# Patient Record
Sex: Female | Born: 1950
Health system: Southern US, Community
[De-identification: ages and names within clinical notes are randomized; demographics above are authoritative.]

## PROBLEM LIST (undated history)

## (undated) DIAGNOSIS — M1990 Unspecified osteoarthritis, unspecified site: Secondary | ICD-10-CM

## (undated) DIAGNOSIS — R7303 Prediabetes: Secondary | ICD-10-CM

## (undated) DIAGNOSIS — E7800 Pure hypercholesterolemia, unspecified: Secondary | ICD-10-CM

## (undated) DIAGNOSIS — Z9989 Dependence on other enabling machines and devices: Secondary | ICD-10-CM

## (undated) DIAGNOSIS — R609 Edema, unspecified: Secondary | ICD-10-CM

## (undated) DIAGNOSIS — Z973 Presence of spectacles and contact lenses: Secondary | ICD-10-CM

## (undated) DIAGNOSIS — I2699 Other pulmonary embolism without acute cor pulmonale: Secondary | ICD-10-CM

## (undated) DIAGNOSIS — R200 Anesthesia of skin: Secondary | ICD-10-CM

## (undated) DIAGNOSIS — I10 Essential (primary) hypertension: Secondary | ICD-10-CM

## (undated) DIAGNOSIS — N809 Endometriosis, unspecified: Secondary | ICD-10-CM

## (undated) DIAGNOSIS — Z972 Presence of dental prosthetic device (complete) (partial): Secondary | ICD-10-CM

## (undated) DIAGNOSIS — I2609 Other pulmonary embolism with acute cor pulmonale: Secondary | ICD-10-CM

## (undated) HISTORY — PX: OTHER SURGICAL HISTORY: SHX169

## (undated) HISTORY — PX: ABDOMINAL HYSTERECTOMY: SHX81

## (undated) HISTORY — PX: JOINT REPLACEMENT: SHX530

## (undated) HISTORY — PX: COLONOSCOPY: SHX174

## (undated) HISTORY — PX: EYE SURGERY: SHX253

## (undated) NOTE — *Deleted (*Deleted)
Transition of Care [facility]) - Progression Note    Patient Details  Name: Lauren Santana MRN: [id number] Date of Birth: Oct 21, 1951  Transition of Care [facility]) CM/SW Contact  Flynn Gwyn, Olegario Messier, RN Phone Number: 07/25/2020, 10:01 AM  Clinical Narrative:[facility] able to accept with otpt [facility] infusion @ [facility]-Dr. Delano Metz more cycles, q 3weeks-9/9 next treatment.Awaiting d/c summary,rm#,tel# for nsg report.[facility] updated on facility-already has auth#[id number],PTAR auth-Will call [facility].   [facility] unable to provide transoprtation for otpt chemo infusion.TC [facility] rep Yoona Ishii-will confirm if able to manage transportation, if so they can accept for d/c today. Son/patient/MD agree to plan.covid 8/31 neg.      Expected Discharge Plan: Skilled Nursing Facility Barriers to Discharge: Other (comment) (Awaiting [facility] if able to manage otpt chemo infusion transportation.)  Expected Discharge Plan and Services Expected Discharge Plan: Skilled Nursing Facility In-house Referral: Clinical Social Work   Post Acute Care Choice: Skilled Nursing Facility Living arrangements for the past 2 months: Apartment                                       Social Determinants of Health (SDOH) Interventions    Readmission Risk Interventions Readmission Risk Prevention Plan 07/23/2020  Transportation Screening Complete  Medication Review (RN CM) Complete  Some recent data might be hidden

---

## 1998-03-29 ENCOUNTER — Ambulatory Visit (HOSPITAL_BASED_OUTPATIENT_CLINIC_OR_DEPARTMENT_OTHER): Admission: RE | Admit: 1998-03-29 | Discharge: 1998-03-29 | Payer: Self-pay | Admitting: *Deleted

## 1998-05-11 ENCOUNTER — Ambulatory Visit (HOSPITAL_COMMUNITY): Admission: RE | Admit: 1998-05-11 | Discharge: 1998-05-12 | Payer: Self-pay | Admitting: Otolaryngology

## 1999-11-18 ENCOUNTER — Emergency Department (HOSPITAL_COMMUNITY): Admission: EM | Admit: 1999-11-18 | Discharge: 1999-11-18 | Payer: Self-pay | Admitting: Emergency Medicine

## 1999-11-18 ENCOUNTER — Encounter: Payer: Self-pay | Admitting: Emergency Medicine

## 1999-11-25 HISTORY — PX: HERNIA REPAIR: SHX51

## 1999-11-26 ENCOUNTER — Ambulatory Visit (HOSPITAL_COMMUNITY): Admission: RE | Admit: 1999-11-26 | Discharge: 1999-11-26 | Payer: Self-pay | Admitting: Internal Medicine

## 1999-11-26 ENCOUNTER — Encounter: Payer: Self-pay | Admitting: Internal Medicine

## 1999-12-31 ENCOUNTER — Ambulatory Visit (HOSPITAL_COMMUNITY): Admission: RE | Admit: 1999-12-31 | Discharge: 1999-12-31 | Payer: Self-pay | Admitting: Internal Medicine

## 1999-12-31 ENCOUNTER — Encounter: Payer: Self-pay | Admitting: Internal Medicine

## 2000-01-23 ENCOUNTER — Observation Stay (HOSPITAL_COMMUNITY): Admission: EM | Admit: 2000-01-23 | Discharge: 2000-01-24 | Payer: Self-pay | Admitting: Pulmonary Disease

## 2000-01-23 ENCOUNTER — Encounter (INDEPENDENT_AMBULATORY_CARE_PROVIDER_SITE_OTHER): Payer: Self-pay | Admitting: Specialist

## 2000-01-23 ENCOUNTER — Encounter: Payer: Self-pay | Admitting: Pulmonary Disease

## 2000-01-24 ENCOUNTER — Encounter: Payer: Self-pay | Admitting: Pulmonary Disease

## 2000-01-28 ENCOUNTER — Encounter: Payer: Self-pay | Admitting: Pulmonary Disease

## 2000-01-28 ENCOUNTER — Inpatient Hospital Stay (HOSPITAL_COMMUNITY): Admission: EM | Admit: 2000-01-28 | Discharge: 2000-02-01 | Payer: Self-pay | Admitting: Pulmonary Disease

## 2000-01-29 ENCOUNTER — Encounter: Payer: Self-pay | Admitting: Pulmonary Disease

## 2000-01-30 ENCOUNTER — Encounter: Payer: Self-pay | Admitting: Pulmonary Disease

## 2000-01-31 ENCOUNTER — Encounter: Payer: Self-pay | Admitting: Pulmonary Disease

## 2000-02-01 ENCOUNTER — Encounter: Payer: Self-pay | Admitting: Pulmonary Disease

## 2000-02-28 ENCOUNTER — Other Ambulatory Visit: Admission: RE | Admit: 2000-02-28 | Discharge: 2000-02-28 | Payer: Self-pay | Admitting: Obstetrics and Gynecology

## 2000-05-07 ENCOUNTER — Encounter: Payer: Self-pay | Admitting: Emergency Medicine

## 2000-05-07 ENCOUNTER — Emergency Department (HOSPITAL_COMMUNITY): Admission: EM | Admit: 2000-05-07 | Discharge: 2000-05-07 | Payer: Self-pay | Admitting: Emergency Medicine

## 2000-05-14 ENCOUNTER — Other Ambulatory Visit: Admission: RE | Admit: 2000-05-14 | Discharge: 2000-05-14 | Payer: Self-pay | Admitting: Gastroenterology

## 2000-05-14 ENCOUNTER — Encounter (INDEPENDENT_AMBULATORY_CARE_PROVIDER_SITE_OTHER): Payer: Self-pay | Admitting: Specialist

## 2000-07-17 ENCOUNTER — Encounter: Payer: Self-pay | Admitting: Obstetrics and Gynecology

## 2000-07-20 ENCOUNTER — Inpatient Hospital Stay (HOSPITAL_COMMUNITY): Admission: RE | Admit: 2000-07-20 | Discharge: 2000-07-25 | Payer: Self-pay | Admitting: Obstetrics and Gynecology

## 2000-07-20 ENCOUNTER — Encounter (INDEPENDENT_AMBULATORY_CARE_PROVIDER_SITE_OTHER): Payer: Self-pay

## 2001-02-28 ENCOUNTER — Encounter: Payer: Self-pay | Admitting: Emergency Medicine

## 2001-02-28 ENCOUNTER — Emergency Department (HOSPITAL_COMMUNITY): Admission: EM | Admit: 2001-02-28 | Discharge: 2001-02-28 | Payer: Self-pay | Admitting: Emergency Medicine

## 2001-02-28 ENCOUNTER — Emergency Department (HOSPITAL_COMMUNITY): Admission: EM | Admit: 2001-02-28 | Discharge: 2001-03-01 | Payer: Self-pay | Admitting: Emergency Medicine

## 2001-03-01 ENCOUNTER — Encounter: Payer: Self-pay | Admitting: Emergency Medicine

## 2001-03-03 ENCOUNTER — Encounter: Payer: Self-pay | Admitting: Internal Medicine

## 2001-03-03 ENCOUNTER — Inpatient Hospital Stay (HOSPITAL_COMMUNITY): Admission: EM | Admit: 2001-03-03 | Discharge: 2001-03-05 | Payer: Self-pay | Admitting: Internal Medicine

## 2001-03-04 ENCOUNTER — Encounter: Payer: Self-pay | Admitting: Internal Medicine

## 2001-04-06 ENCOUNTER — Encounter: Payer: Self-pay | Admitting: Internal Medicine

## 2001-04-06 ENCOUNTER — Ambulatory Visit (HOSPITAL_COMMUNITY): Admission: RE | Admit: 2001-04-06 | Discharge: 2001-04-06 | Payer: Self-pay | Admitting: Internal Medicine

## 2001-12-13 ENCOUNTER — Other Ambulatory Visit: Admission: RE | Admit: 2001-12-13 | Discharge: 2001-12-13 | Payer: Self-pay | Admitting: Obstetrics and Gynecology

## 2002-01-13 ENCOUNTER — Encounter: Payer: Self-pay | Admitting: Otolaryngology

## 2002-01-13 ENCOUNTER — Encounter: Admission: RE | Admit: 2002-01-13 | Discharge: 2002-01-13 | Payer: Self-pay | Admitting: Otolaryngology

## 2003-01-19 ENCOUNTER — Other Ambulatory Visit: Admission: RE | Admit: 2003-01-19 | Discharge: 2003-01-19 | Payer: Self-pay | Admitting: Obstetrics and Gynecology

## 2004-02-28 ENCOUNTER — Other Ambulatory Visit: Admission: RE | Admit: 2004-02-28 | Discharge: 2004-02-28 | Payer: Self-pay | Admitting: Obstetrics and Gynecology

## 2004-10-21 ENCOUNTER — Ambulatory Visit: Payer: Self-pay | Admitting: Internal Medicine

## 2004-12-05 ENCOUNTER — Ambulatory Visit: Payer: Self-pay | Admitting: Internal Medicine

## 2004-12-13 ENCOUNTER — Ambulatory Visit: Payer: Self-pay | Admitting: Internal Medicine

## 2005-01-24 ENCOUNTER — Ambulatory Visit: Payer: Self-pay | Admitting: Internal Medicine

## 2005-02-19 ENCOUNTER — Ambulatory Visit: Payer: Self-pay | Admitting: Internal Medicine

## 2005-07-01 ENCOUNTER — Ambulatory Visit: Payer: Self-pay | Admitting: Internal Medicine

## 2005-07-18 ENCOUNTER — Ambulatory Visit: Payer: Self-pay | Admitting: Internal Medicine

## 2005-08-16 ENCOUNTER — Emergency Department (HOSPITAL_COMMUNITY): Admission: EM | Admit: 2005-08-16 | Discharge: 2005-08-16 | Payer: Self-pay | Admitting: Emergency Medicine

## 2005-12-31 ENCOUNTER — Ambulatory Visit: Payer: Self-pay | Admitting: Internal Medicine

## 2006-02-26 ENCOUNTER — Ambulatory Visit: Payer: Self-pay | Admitting: Internal Medicine

## 2006-03-31 ENCOUNTER — Ambulatory Visit: Payer: Self-pay | Admitting: Internal Medicine

## 2006-04-14 ENCOUNTER — Ambulatory Visit: Payer: Self-pay | Admitting: Internal Medicine

## 2006-06-24 ENCOUNTER — Ambulatory Visit: Payer: Self-pay | Admitting: Internal Medicine

## 2006-07-20 ENCOUNTER — Ambulatory Visit: Payer: Self-pay | Admitting: Nurse Practitioner

## 2006-07-21 ENCOUNTER — Encounter: Payer: Self-pay | Admitting: Vascular Surgery

## 2006-07-21 ENCOUNTER — Ambulatory Visit (HOSPITAL_COMMUNITY): Admission: RE | Admit: 2006-07-21 | Discharge: 2006-07-21 | Payer: Self-pay | Admitting: Family Medicine

## 2006-07-24 ENCOUNTER — Ambulatory Visit: Payer: Self-pay | Admitting: *Deleted

## 2006-09-07 ENCOUNTER — Ambulatory Visit: Payer: Self-pay | Admitting: Nurse Practitioner

## 2006-10-08 ENCOUNTER — Ambulatory Visit: Payer: Self-pay | Admitting: Nurse Practitioner

## 2006-12-22 ENCOUNTER — Ambulatory Visit: Payer: Self-pay | Admitting: Nurse Practitioner

## 2007-01-22 DIAGNOSIS — N803 Endometriosis of pelvic peritoneum: Secondary | ICD-10-CM | POA: Insufficient documentation

## 2007-01-22 DIAGNOSIS — I10 Essential (primary) hypertension: Secondary | ICD-10-CM | POA: Insufficient documentation

## 2007-03-22 ENCOUNTER — Ambulatory Visit: Payer: Self-pay | Admitting: Nurse Practitioner

## 2007-03-25 ENCOUNTER — Encounter: Admission: RE | Admit: 2007-03-25 | Discharge: 2007-03-25 | Payer: Self-pay | Admitting: Family Medicine

## 2007-05-25 ENCOUNTER — Ambulatory Visit: Payer: Self-pay | Admitting: Family Medicine

## 2007-06-08 ENCOUNTER — Ambulatory Visit: Payer: Self-pay | Admitting: Family Medicine

## 2007-08-11 ENCOUNTER — Encounter (INDEPENDENT_AMBULATORY_CARE_PROVIDER_SITE_OTHER): Payer: Self-pay | Admitting: *Deleted

## 2007-08-16 ENCOUNTER — Ambulatory Visit: Payer: Self-pay | Admitting: Internal Medicine

## 2007-10-12 ENCOUNTER — Ambulatory Visit: Payer: Self-pay | Admitting: Family Medicine

## 2008-01-25 ENCOUNTER — Ambulatory Visit: Payer: Self-pay | Admitting: Nurse Practitioner

## 2008-01-27 ENCOUNTER — Ambulatory Visit: Payer: Self-pay | Admitting: Family Medicine

## 2008-02-14 ENCOUNTER — Ambulatory Visit: Payer: Self-pay | Admitting: Internal Medicine

## 2008-02-16 ENCOUNTER — Ambulatory Visit: Payer: Self-pay | Admitting: Internal Medicine

## 2008-03-15 ENCOUNTER — Ambulatory Visit: Payer: Self-pay | Admitting: Internal Medicine

## 2008-03-23 ENCOUNTER — Encounter: Admission: RE | Admit: 2008-03-23 | Discharge: 2008-06-21 | Payer: Self-pay | Admitting: Nurse Practitioner

## 2008-04-18 ENCOUNTER — Encounter: Admission: RE | Admit: 2008-04-18 | Discharge: 2008-05-12 | Payer: Self-pay | Admitting: Internal Medicine

## 2008-06-21 ENCOUNTER — Ambulatory Visit: Payer: Self-pay | Admitting: Internal Medicine

## 2008-06-21 ENCOUNTER — Encounter (INDEPENDENT_AMBULATORY_CARE_PROVIDER_SITE_OTHER): Payer: Self-pay | Admitting: Family Medicine

## 2008-07-04 ENCOUNTER — Encounter (INDEPENDENT_AMBULATORY_CARE_PROVIDER_SITE_OTHER): Payer: Self-pay | Admitting: Family Medicine

## 2008-07-04 ENCOUNTER — Ambulatory Visit: Payer: Self-pay | Admitting: Internal Medicine

## 2008-07-04 LAB — CONVERTED CEMR LAB
ALT: 19 units/L (ref 0–35)
AST: 18 units/L (ref 0–37)
Albumin: 4.2 g/dL (ref 3.5–5.2)
Alkaline Phosphatase: 61 units/L (ref 39–117)
BUN: 14 mg/dL (ref 6–23)
Basophils Absolute: 0 10*3/uL (ref 0.0–0.1)
Basophils Relative: 1 % (ref 0–1)
CO2: 23 meq/L (ref 19–32)
Calcium: 9.6 mg/dL (ref 8.4–10.5)
Chloride: 105 meq/L (ref 96–112)
Cholesterol: 168 mg/dL (ref 0–200)
Creatinine, Ser: 0.82 mg/dL (ref 0.40–1.20)
Eosinophils Absolute: 0.2 10*3/uL (ref 0.0–0.7)
Eosinophils Relative: 5 % (ref 0–5)
Glucose, Bld: 82 mg/dL (ref 70–99)
HCT: 44.2 % (ref 36.0–46.0)
HDL: 74 mg/dL (ref >39)
Hemoglobin: 13.8 g/dL (ref 12.0–15.0)
LDL Cholesterol: 81 mg/dL (ref 0–99)
Lymphocytes Relative: 49 % — ABNORMAL HIGH (ref 12–46)
Lymphs Abs: 2.3 10*3/uL (ref 0.7–4.0)
MCHC: 31.2 g/dL (ref 30.0–36.0)
MCV: 101.6 fL — ABNORMAL HIGH (ref 78.0–100.0)
Monocytes Absolute: 0.4 10*3/uL (ref 0.1–1.0)
Monocytes Relative: 9 % (ref 3–12)
Neutro Abs: 1.8 10*3/uL (ref 1.7–7.7)
Neutrophils Relative %: 37 % — ABNORMAL LOW (ref 43–77)
Platelets: 131 10*3/uL — ABNORMAL LOW (ref 150–400)
Potassium: 3.8 meq/L (ref 3.5–5.3)
RBC: 4.35 M/uL (ref 3.87–5.11)
RDW: 13.8 % (ref 11.5–15.5)
Sodium: 143 meq/L (ref 135–145)
TSH: 1.547 microintl units/mL (ref 0.350–4.50)
Total Bilirubin: 0.6 mg/dL (ref 0.3–1.2)
Total CHOL/HDL Ratio: 2.3
Total Protein: 7.3 g/dL (ref 6.0–8.3)
Triglycerides: 65 mg/dL (ref <150)
VLDL: 13 mg/dL (ref 0–40)
WBC: 4.8 10*3/uL (ref 4.0–10.5)

## 2008-07-19 ENCOUNTER — Encounter (INDEPENDENT_AMBULATORY_CARE_PROVIDER_SITE_OTHER): Payer: Self-pay | Admitting: Family Medicine

## 2008-07-19 ENCOUNTER — Ambulatory Visit: Payer: Self-pay | Admitting: Internal Medicine

## 2008-07-19 LAB — CONVERTED CEMR LAB
Folate: >20 ng/mL
Vit D, 1,25-Dihydroxy: 12 — ABNORMAL LOW (ref 30–89)

## 2008-08-08 ENCOUNTER — Encounter: Admission: RE | Admit: 2008-08-08 | Discharge: 2008-09-07 | Payer: Self-pay | Admitting: Family Medicine

## 2008-08-14 ENCOUNTER — Ambulatory Visit: Payer: Self-pay | Admitting: Internal Medicine

## 2008-10-10 ENCOUNTER — Encounter (INDEPENDENT_AMBULATORY_CARE_PROVIDER_SITE_OTHER): Payer: Self-pay | Admitting: Family Medicine

## 2008-10-11 ENCOUNTER — Ambulatory Visit: Payer: Self-pay | Admitting: Family Medicine

## 2008-10-26 ENCOUNTER — Ambulatory Visit: Payer: Self-pay | Admitting: Internal Medicine

## 2008-10-26 ENCOUNTER — Encounter (INDEPENDENT_AMBULATORY_CARE_PROVIDER_SITE_OTHER): Payer: Self-pay | Admitting: Internal Medicine

## 2008-10-26 LAB — CONVERTED CEMR LAB
Cholesterol: 175 mg/dL (ref 0–200)
HDL: 66 mg/dL (ref >39)
LDL Cholesterol: 97 mg/dL (ref 0–99)
Total CHOL/HDL Ratio: 2.7
Triglycerides: 61 mg/dL (ref <150)
VLDL: 12 mg/dL (ref 0–40)
Vit D, 1,25-Dihydroxy: 37 (ref 30–89)

## 2008-11-14 ENCOUNTER — Ambulatory Visit (HOSPITAL_COMMUNITY): Admission: RE | Admit: 2008-11-14 | Discharge: 2008-11-14 | Payer: Self-pay | Admitting: Family Medicine

## 2008-11-30 ENCOUNTER — Encounter: Admission: RE | Admit: 2008-11-30 | Discharge: 2008-11-30 | Payer: Self-pay | Admitting: Family Medicine

## 2009-01-26 ENCOUNTER — Ambulatory Visit: Payer: Self-pay | Admitting: Internal Medicine

## 2009-02-15 ENCOUNTER — Ambulatory Visit: Payer: Self-pay | Admitting: Internal Medicine

## 2009-02-19 ENCOUNTER — Ambulatory Visit: Payer: Self-pay | Admitting: Internal Medicine

## 2009-03-02 ENCOUNTER — Ambulatory Visit: Payer: Self-pay | Admitting: Internal Medicine

## 2009-03-02 ENCOUNTER — Encounter (INDEPENDENT_AMBULATORY_CARE_PROVIDER_SITE_OTHER): Payer: Self-pay | Admitting: Internal Medicine

## 2009-03-02 LAB — CONVERTED CEMR LAB
ALT: 26 units/L (ref 0–35)
AST: 25 units/L (ref 0–37)
Albumin: 4.3 g/dL (ref 3.5–5.2)
Alkaline Phosphatase: 67 units/L (ref 39–117)
BUN: 9 mg/dL (ref 6–23)
CO2: 28 meq/L (ref 19–32)
Calcium: 10 mg/dL (ref 8.4–10.5)
Chloride: 104 meq/L (ref 96–112)
Creatinine, Ser: 0.73 mg/dL (ref 0.40–1.20)
Glucose, Bld: 73 mg/dL (ref 70–99)
Potassium: 3.7 meq/L (ref 3.5–5.3)
Sodium: 144 meq/L (ref 135–145)
Total Bilirubin: 0.5 mg/dL (ref 0.3–1.2)
Total Protein: 7.2 g/dL (ref 6.0–8.3)

## 2009-03-06 ENCOUNTER — Ambulatory Visit (HOSPITAL_COMMUNITY): Admission: RE | Admit: 2009-03-06 | Discharge: 2009-03-06 | Payer: Self-pay | Admitting: Internal Medicine

## 2009-04-03 ENCOUNTER — Ambulatory Visit: Payer: Self-pay | Admitting: Family Medicine

## 2009-04-16 ENCOUNTER — Encounter: Admission: RE | Admit: 2009-04-16 | Discharge: 2009-05-16 | Payer: Self-pay | Admitting: Family Medicine

## 2009-04-19 ENCOUNTER — Ambulatory Visit: Payer: Self-pay | Admitting: Family Medicine

## 2009-06-04 ENCOUNTER — Encounter: Admission: RE | Admit: 2009-06-04 | Discharge: 2009-06-04 | Payer: Self-pay | Admitting: Internal Medicine

## 2009-06-22 ENCOUNTER — Ambulatory Visit: Payer: Self-pay | Admitting: Family Medicine

## 2009-07-17 ENCOUNTER — Telehealth (INDEPENDENT_AMBULATORY_CARE_PROVIDER_SITE_OTHER): Payer: Self-pay | Admitting: *Deleted

## 2009-07-23 ENCOUNTER — Telehealth (INDEPENDENT_AMBULATORY_CARE_PROVIDER_SITE_OTHER): Payer: Self-pay | Admitting: *Deleted

## 2009-08-01 ENCOUNTER — Ambulatory Visit: Payer: Self-pay | Admitting: Internal Medicine

## 2009-08-13 ENCOUNTER — Ambulatory Visit: Payer: Self-pay | Admitting: Internal Medicine

## 2009-08-15 ENCOUNTER — Ambulatory Visit: Payer: Self-pay | Admitting: Internal Medicine

## 2009-11-24 HISTORY — PX: MULTIPLE TOOTH EXTRACTIONS: SHX2053

## 2009-12-20 ENCOUNTER — Ambulatory Visit: Payer: Self-pay | Admitting: Internal Medicine

## 2009-12-25 ENCOUNTER — Ambulatory Visit (HOSPITAL_COMMUNITY): Admission: RE | Admit: 2009-12-25 | Discharge: 2009-12-25 | Payer: Self-pay | Admitting: Pulmonary Disease

## 2010-01-17 ENCOUNTER — Ambulatory Visit: Payer: Self-pay | Admitting: Internal Medicine

## 2010-01-21 ENCOUNTER — Ambulatory Visit (HOSPITAL_COMMUNITY): Admission: RE | Admit: 2010-01-21 | Discharge: 2010-01-21 | Payer: Self-pay | Admitting: Internal Medicine

## 2010-02-21 ENCOUNTER — Ambulatory Visit: Payer: Self-pay | Admitting: Internal Medicine

## 2010-02-21 LAB — CONVERTED CEMR LAB
BUN: 8 mg/dL (ref 6–23)
CO2: 24 meq/L (ref 19–32)
Calcium: 9.2 mg/dL (ref 8.4–10.5)
Chloride: 101 meq/L (ref 96–112)
Creatinine, Ser: 0.85 mg/dL (ref 0.40–1.20)
Glucose, Bld: 80 mg/dL (ref 70–99)
Potassium: 5 meq/L (ref 3.5–5.3)
Sodium: 137 meq/L (ref 135–145)
Uric Acid, Serum: 6.8 mg/dL (ref 2.4–7.0)

## 2010-02-28 ENCOUNTER — Ambulatory Visit: Payer: Self-pay | Admitting: Internal Medicine

## 2010-03-07 ENCOUNTER — Ambulatory Visit: Payer: Self-pay | Admitting: Internal Medicine

## 2010-03-07 LAB — CONVERTED CEMR LAB
BUN: 10 mg/dL (ref 6–23)
CO2: 27 meq/L (ref 19–32)
Calcium: 8.9 mg/dL (ref 8.4–10.5)
Chloride: 103 meq/L (ref 96–112)
Creatinine, Ser: 0.84 mg/dL (ref 0.40–1.20)
Glucose, Bld: 86 mg/dL (ref 70–99)
Magnesium: 2.3 mg/dL (ref 1.5–2.5)
Potassium: 3.9 meq/L (ref 3.5–5.3)
Sodium: 141 meq/L (ref 135–145)

## 2010-03-14 ENCOUNTER — Ambulatory Visit: Payer: Self-pay | Admitting: Internal Medicine

## 2010-03-14 LAB — CONVERTED CEMR LAB
BUN: 14 mg/dL (ref 6–23)
CO2: 25 meq/L (ref 19–32)
Calcium: 9.3 mg/dL (ref 8.4–10.5)
Chloride: 105 meq/L (ref 96–112)
Creatinine, Ser: 0.85 mg/dL (ref 0.40–1.20)
Glucose, Bld: 80 mg/dL (ref 70–99)
Magnesium: 2.1 mg/dL (ref 1.5–2.5)
Potassium: 3.8 meq/L (ref 3.5–5.3)
Sodium: 142 meq/L (ref 135–145)

## 2010-03-19 ENCOUNTER — Telehealth: Payer: Self-pay | Admitting: Gastroenterology

## 2010-03-28 ENCOUNTER — Ambulatory Visit: Payer: Self-pay | Admitting: Internal Medicine

## 2010-04-09 ENCOUNTER — Encounter: Admission: RE | Admit: 2010-04-09 | Discharge: 2010-05-30 | Payer: Self-pay | Admitting: Internal Medicine

## 2010-05-03 ENCOUNTER — Ambulatory Visit: Payer: Self-pay | Admitting: Internal Medicine

## 2010-05-03 LAB — CONVERTED CEMR LAB
BUN: 11 mg/dL (ref 6–23)
CO2: 28 meq/L (ref 19–32)
Calcium: 9.4 mg/dL (ref 8.4–10.5)
Chloride: 104 meq/L (ref 96–112)
Creatinine, Ser: 0.75 mg/dL (ref 0.40–1.20)
Glucose, Bld: 76 mg/dL (ref 70–99)
Potassium: 3.8 meq/L (ref 3.5–5.3)
Sodium: 142 meq/L (ref 135–145)

## 2010-06-04 ENCOUNTER — Ambulatory Visit: Payer: Self-pay | Admitting: Family Medicine

## 2010-06-05 ENCOUNTER — Encounter (INDEPENDENT_AMBULATORY_CARE_PROVIDER_SITE_OTHER): Payer: Self-pay | Admitting: Family Medicine

## 2010-06-05 ENCOUNTER — Ambulatory Visit: Payer: Self-pay | Admitting: Family Medicine

## 2010-06-13 ENCOUNTER — Encounter: Admission: RE | Admit: 2010-06-13 | Discharge: 2010-06-13 | Payer: Self-pay | Admitting: Family Medicine

## 2010-07-17 ENCOUNTER — Ambulatory Visit: Payer: Self-pay | Admitting: Internal Medicine

## 2010-07-17 LAB — CONVERTED CEMR LAB
BUN: 13 mg/dL (ref 6–23)
CO2: 29 meq/L (ref 19–32)
Calcium: 9.2 mg/dL (ref 8.4–10.5)
Chloride: 103 meq/L (ref 96–112)
Creatinine, Ser: 0.82 mg/dL (ref 0.40–1.20)
Glucose, Bld: 80 mg/dL (ref 70–99)
Magnesium: 2.1 mg/dL (ref 1.5–2.5)
Potassium: 3.6 meq/L (ref 3.5–5.3)
Sodium: 141 meq/L (ref 135–145)

## 2010-07-19 ENCOUNTER — Ambulatory Visit: Payer: Self-pay | Admitting: Internal Medicine

## 2010-09-10 ENCOUNTER — Ambulatory Visit: Payer: Self-pay | Admitting: Internal Medicine

## 2010-12-26 NOTE — Progress Notes (Signed)
Summary: triage/circulation problems  Phone Note Call from Patient   Caller: Patient Reason for Call: Talk to Nurse Summary of Call: Patient came to [employer]..wanting to be seen..stating she has circulation problems...patient had previously cancelled appt 8/6 and resch for 9/8...now states she doesn't know if she can wait that long...patient has hx of lower leg edema  and pain...patient states she has not been following physician instructions of using heat and leg exercises...observed ankles and feet....usual edema noted.  Not red or hot to the touch...adviced patient to follow providers instructions of home care and to keep followup appt.Marland KitchenMarland KitchenCall if conditions worsen...difficulty breathing, severe pain. Initial call taken by: Conchita Paris,  July 17, 2009 10:52 AM

## 2010-12-26 NOTE — Progress Notes (Signed)
Summary: pain medicine change  Phone Note Other Incoming   Summary of Call: Patient came to [facility] today reporting Vicodin is not helping any more than Tramadol and wants to switch back. She does not like to take medicine and would prefer a non narccotic med. She reports occ. numbness in her right leg and some numbness and discomfort in her left hjip and leg. Patient has an appt. for f/u on 08/01/09. She wants to make sure she is checked for diabetes when she comes. Patien advised RN would check with Madaline Savage ID-Din NP to ensure this is okay. Pt. can be reached at [phone] or [phone]. Initial call taken by: Sharen Heck RN,  July 23, 2009 12:30 PM

## 2010-12-26 NOTE — Progress Notes (Signed)
Summary: Schedule recall colon   Phone Note Outgoing Call Call back at Home Phone [phone]   Call placed by: Christie Nottingham CMA Duncan Dull),  March 19, 2010 2:38 PM Call placed to: Patient Summary of Call: Called pt to schedule recall colon but phone number disconnected in EMR and in IDX. Initial call taken by: Christie Nottingham CMA Duncan Dull),  March 19, 2010 2:39 PM

## 2010-12-26 NOTE — Miscellaneous (Signed)
Summary: VIP  Patient: Lauren Santana Note: All result statuses are Final unless otherwise noted.  Tests: (1) VIP (Medications)   LLIMPORTMEDS              "Result Below..."       RESULT: LIPITOR TABS 10 MG*TAKE ONE (1) TABLET BY MOUTH EVERY DAY  GENE HAS ICP LIPITOR 10MG  0808*08/16/2007*Last Refill: [id number]*******   LLIMPORTMEDS              "Result Below..."       RESULT: LABETALOL HCL TABS 100 MG*TAKE 1 AND 1/2 TABLETS TWICE DAILY*08/16/2007*Last Refill: Unknown*[id number]*******   LLIMPORTMEDS              "Result Below..."       RESULT: HYDROCHLOROTHIAZIDE TABS 25 MG*TAKE ONE (1) TABLET EACH DAY*08/16/2007*Last Refill: Unknown*[id number]*******   LLIMPORTMEDS              "Result Below..."       RESULT: DICLOFENAC SODIUM TBEC 75 MG*TAKE ONE (1) TABLET TWICE DAILY*08/16/2007*Last Refill: NWGNFAO*[id number]*******   LLIMPORTALLS              "Result Below..."       RESULT: DOXYCYCLINE HYCLATE ORAL (VIBRAMYCIN)*7063**   LLIMPORTALLS              "Result Below..."       RESULT: OFLOXACIN ORAL (VHQION)*62952**   LLIMPORTALLS              "Result Below..."       RESULT: SULFONAMIDES*44244**   LLIMPORTALLS              "Result Below..."       RESULT: TERBINAFINE ORAL (LAMISIL)*43935**  Note: An exclamation mark (!) indicates a result that was not dispersed into the flowsheet. Document Creation Date: 09/23/2007 3:00 PM _______________________________________________________________________  (1) Order result status: Final Collection or observation date-time: 08/11/2007 Requested date-time: 08/11/2007 Receipt date-time:  Reported date-time: 08/11/2007 Referring Physician:   Ordering Physician:   Specimen Source:  Source: [place] Order Number:  Lab site:

## 2011-03-06 ENCOUNTER — Other Ambulatory Visit: Payer: Self-pay | Admitting: Internal Medicine

## 2011-04-11 NOTE — Discharge Summary (Signed)
[facility]  Patient:    Lauren Santana, Lauren Santana             MRN: [id number] Adm. Date:  [id number] Disc. Date: [id number] Attending:  Sharon Mt CC:         Anselm Pancoast. Zachery Dakins, M.D.   Discharge Summary  HOSPITAL COURSE:  The patient is a 60 year old female who was admitted to the hospital with an umbilical hernia, pelvic pain, history of endometriosis, and intermittent vaginal bleeding for definitive surgery.  On the day of admission she was taken to the operating room and exploratory laparotomy was performed. Findings were an umbilical hernia with multiple fascial defects, dense pelvic adhesions involving both ovaries and tubes without any active endometriosis seen.  She underwent lysis of adhesions, bilateral salpingo-oophorectomy, and repair of an umbilical hernia with a mesh graft.  Postoperatively, she progressed slowly, but surely.  Her drain was removed, her NG tube was removed, she eventually started to pass gas and have a bowel movement, and on September 4 she was ready for discharge.  DISCHARGE MEDICATIONS:  Tylox for pain relief.  DISCHARGE INSTRUCTIONS:  Soft diet to advance as tolerated.  FOLLOWUP:  She was to return to Dr. Vella Redhead office for staple removal and to Dr. Delorise Royals office for final followup.  LABORATORY DATA:  Laboratory data is in chart at the time of dictation.  Final pathology report revealed hernia sac with nonspecific reactive changes, no tumor identified.  Bilateral tubes and ovaries with fibrous adhesions, no tumor identified.  Omentum with fibrous adhesions, no tumor identified. Organizing fat necrosis associated with fibrous adhesions without endometriosis or tumor identified.  CONDITION ON DISCHARGE:  Improved.  DISCHARGE DIAGNOSES: 1. Umbilical hernia with multiple fascial defects. 2. Pelvic pain. 3. Pelvic adhesive disease. 4. Intermittent vaginal bleeding.  OPERATIONS: 1. Exploratory  laparotomy with lysis of adhesions. 2. Bilateral salpingo-oophorectomy. 3. Repair of umbilical hernia with mesh graft. DD:  08/06/00 TD:  08/09/00 Job: [id number]

## 2011-04-11 NOTE — Op Note (Signed)
[facility]  Patient:    Lauren Santana, Lauren Santana             MRN: [id number] Proc. Date: 07/20/00 Adm. Date:  [id number] Attending:  Sharon Mt CC:         Rande Brunt. Eda Paschal, M.D.   Operative Report  PREOPERATIVE DIAGNOSES: 1. Incarcerated incisional hernia. 2. History of endometriosis.  OPERATION:  Repair of incisional herniorrhaphies with mesh reinforcement. Daniel L. Gottsegen, M.D., did a bilateral oophorectomy.  ANESTHESIA:  General.  HISTORY:  Lauren Santana is a 60 year old overweight white female who was referred to me about six weeks ago for an incisional hernia.  The patient has had a long, complex problem with endometriosis, hysterectomy, repair of an incisional hernia by Dr. Michaelle Copas years ago, and she continues to have intermittent bleeding from the vagina that is thought to be related to endometriosis implants and is being followed by Dr. Eda Paschal for this.  I saw her, and on physical exam she had an obvious incisional hernia that would come out to the right below the umbilicus, and there were also defects at the navel with other areas, and I recommended that we proceed with repair of this.  I talked with Dr. Eda Paschal, and he said that since she was having such problems with her endometriosis if she was off of the Depo-Provera, that he recommended we do a combined oophorectomy and let me repair the incisional hernia simultaneously.  He treated her with Depo-Provera with improvement in her symptoms, and she is scheduled electively for surgery today.  Dr. Eda Paschal actually admitted Ms. Potier, and I was planned on asissting him and then repair the incisional hernias.  DESCRIPTION OF PROCEDURE:  The patient preoperatively had Cefotetan.  She had taken a laxative at home, and the abdomen was prepped with Betadine surgical solution, draped in a sterile manner.  A Foley catheter had been inserted sterilely.  The  patient was draped in a sterile manner, and then the lower midline old incision was opened up down through the skin and subcutaneous tissue.  We did not actually go as low as they had done when they did her hysterectomy, since her hernias appeared to really be above that and at the navel, etc.  We then very carefully dissected down, identifying the largest hernia sac, and there was a knuckle of small bowel that was actually caught up in that, along also a large wad of omentum, and we worked through that, kind of extending the fascial defect.  Noted also, there was a small, about 25-cent size incarcerated hernia down in the lower abdomen, plus an area at the navel, and then plus another area about 3 or 4 cm above the navel.  Most of these fascial defects were an inch and a half in diameter.  We continued taking down all the adhesions and chronic scarring.  There was one area that we thought was probably an endometrioma, a fibrotic scar area, and this was sent for frozen, but the pathologist says that he can definitely identify a foreign body reaction, old suture material, but nothing that they could definitely confirm as endometriosis.  This is actually on the small bowel, where it was incarcerated in the hernia, and since they definitely could not confirm endometriosis, I elected not to remove that since the area was partially obstructed but after you reduced the hernia, etc., everything would go easily through the intestine.  With the small bowel freed up, etc., then Dr. Eda Paschal did  the bilateral salpingo-oophorectomy, the right first and then the left, and he will dictate this portion of the procedure.  After he had completed his portion, there appeared to be reasonably good hemostasis.  He dropped out, and I completed the herniorrhaphies.  Before I actually put in the mesh in the preperitoneal space, but since she has had a recurrent, this is the second time this hernia had been  attempted, I elected to actually place the mesh on top of the fascia and used a piece of Prolene mesh about 10 x 4 inches in size.  I reinspected the pelvis after we had dissected out the fascial edges circumferentially, etc., and repaired the actual fascial defect, the higher one above the umbilicus, without actually cutting the little bridge of fascia between the umbilicus and this hernia.  We then dissected and started closing the fascia with figure-of-eights of 0 Vicryl, and then the mesh, which goes about 2 inches past the incisions in all directions, we sutured that in with 0 Prolene sutures that were just placed in the anterior abdominal wall.  I was using the malleable up under the area and inspection, etc.  The actual sutures to hold the mesh in place were placed first, and then the incision was closed.  I inspected everything, and it appears that one little area up in the upper right, we had caught the omentum with a stitch, and we pulled that stitch out and placed another under direct vision.  Next, we closed the midline fascia and then incorporated the mesh in the sutures and then placed the sutures through the mesh that had been previously placed laterally and tied everything down so it was flat.  I then used a 10 mm Blake drain on top of the mesh and then closed the subcutaneous tissue with either 2-0 or 3-0 Vicryl, and then skin staples.  We will use PCA morphine for postoperative pain control, and I think it would be best to keep her n.p.o. at least for 24 hours because of the extensive dissection and the kind of partial blockage that we identified.  Probably about 300 cc of blood loss with the oophorectomy portion, but there were a lot of little bleeders that required suturing with 3-0 Vicryl or cauterized and in the subcutaneous tissue also. The patient tolerated the procedure nicely, NG tube and Foley catheter in position, and she was sent to the recovery room after a  sterile occlusive dressing had been applied. DD:  07/20/00 TD:  07/21/00 Job: [id number]

## 2011-04-11 NOTE — H&P (Signed)
[facility]  Patient:    Lauren Santana, Lauren Santana                     MRN: [id number] Attending:  Rande Brunt. Eda Paschal, M.D.                         History and Physical  CHIEF COMPLAINT:  Pelvic pain, intermittent vaginal bleeding, endometriosis suspected.  HISTORY OF PRESENT ILLNESS:  Patient is a 60 year old gravida 3, para 3-0-1-3, who has been seeing me now for 3-1/2 years with pelvic pain, intermittent abdominal bleeding.  Patient had undergone a total abdominal hysterectomy back in 1988.  At that time she had multiple leiomyoma, had normal ovaries and tubes, but had a skin lesion which came back endometriosis.  Since then she has had intermittent pelvic pain associated with intermittent vaginal bleeding.  Ultrasound has always failed to reveal any neoplastic masses, although she has had intermittent cysts of the left ovary consistent with endometriosis.  Patient has responded well to Depo-Provera, but has had side effects from the Depo-Provera and continues to get the recurrent pelvic pain and recurrent bleeding.  After 3-1/2 years of putting up with this she has decided to go ahead and have surgery.  At the moment she also has umbilical hernia and this has influenced her decision slightly as she would like to have that repaired.  She is going to have that repaired by Dr. Zachery Dakins at the same time that she has an exploratory laparotomy for the pelvic pain and bleeding.  Our plan is to go ahead and do a bilateral salpingo-oophorectomy. Patient understands that after this she will require hormonal replacement therapy if she is symptomatic, but for relief of the above, she would like to undergo this.  PAST MEDICAL HISTORY:  History of hypertension.  MEDICATIONS:  She takes Verapamil SA 180 mg daily.  She also is on Macrodantin for recurrent urinary tract infections.  ALLERGIES:  SULFA.  FAMILY HISTORY:  Mother has coronary artery disease.  SOCIAL  HISTORY:  Patient is a nonsmoker, nondrinker.  REVIEW OF SYSTEMS:  HEENT is negative.  CARDIAC:  Reveals a history of hypertension.  RESPIRATORY:  Negative.  GASTROINTESTINAL:  Reveals chronic irritable bowel syndrome.  She is being followed for this by her internist and has had a GI evaluation which failed to reveal any reason for the pelvic pain. GENITOURINARY:  Reveals chronic urinary tract infections.  NEUROMUSCULAR: Negative.  ENDOCRINE:  Negative.  PHYSICAL EXAMINATION:  GENERAL:  Patient is a well-developed, well-nourished female in no acute distress.  VITAL SIGNS:  Her blood pressure is 150/94.  Pulse is 80 and regular. Respirations 16 and nonlabored.  She is afebrile.  HEENT:  Within normal limits.  NECK:  Supple.  Trachea in the midline.  Thyroid is not enlarged.  LUNGS:  Clear to P&A.  HEART:  Reveals no thrills, heaves, or murmurs.  BREASTS:  No masses.  ABDOMEN:  Soft without guarding, rebound, or masses.  She has a moderate sized umbilical hernia.  PELVIC:  Sternal and vaginal is within normal limits.  Uterus is surgically absent.  Pap smear shows no atypia.  Adnexae do not appear to be enlarged, but they are bilaterally tender.  RECTAL:  Negative.  EXTREMITIES:  Within normal limits,  IMPRESSION: 1. Chronic pelvic pain. 2. Intermittent vaginal bleeding. 3. Umbilical hernia. 4. Hypertension.  PLAN:  Exploratory laparotomy with bilateral salpingo-oophorectomy and excision of any endometriosis found, repair of  umbilical hernia. DD:  07/19/00 TD:  07/19/00 Job: [id number]

## 2011-04-11 NOTE — H&P (Signed)
[facility]  Patient:    Lauren Santana, Lauren Santana             MRN: [id number] Adm. Date:  [id number] Attending:  Dolores Patty                         History and Physical  DATE OF BIRTH:  Jun 28, 1951.  HISTORY OF PRESENT ILLNESS:  Sherae Santino is a 60 year old Afro-American female admitted with intractable nausea and vomiting.  She was seen initially March 01, 2001, in the office with nausea and vomiting present for three days. She had been to the emergency room on April 6 and February 28, 2001.  At that time, the lipase had been mildly elevated at 75 (normal is less than 51), and the potassium had been 3.4.  The other laboratory studies were negative.  The BUN and creatinine were 7 and 0.8, respectively.  CT of the abdomen and pelvis showed the appendix to be upper of normal caliber; there was a tiny right real cyst.  There was suggestion of cellulitis versus panniculitis of the anterior abdomen and abdominal wall with a fascial defect.  She was not given oral contrast, and because of her body habitus it was difficult to assess pelvic loops, whose margins were poorly seen.  An inflammatory process could not be ruled out.  She was treated with hydrocodone, cephalexin, and promethazine 25 mg orally, and Compazine suppositories.  She had had some loose stools on April 6 and had taken Pepto Bismol with resolution.  Subsequent to that she had constipation.  She had been on Citrucel chronically from her surgeon.  She relates the onset of symptoms to overeating at a church social function on February 26, 2001.  When seen on April 8, abdomen was protuberant, and bowel sounds were decreased.  The hydrocodone was discontinued, and she was placed on Levsin SL every four to six hours as needed orally or under her tongue.  She was to stay on clear liquids for 48-72 hours and use the suppositories.  She was given a shot of Demerol 50 mg and Phenergan  50 mg intramuscular in the office. Gatorade and bananas were recommended for potassium repletion.  Her family called April 10, stating that she was continuing to have nausea and vomiting despite having used all five Compazine suppositories.  He had vomited some five to six times the night prior to the office visit.  She was found to be mildly febrile and clinically was found to have suggestion of an ileus, prompting admission due to these findings and the intractable nausea and vomiting despite optimal outpatient care.  PAST MEDICAL HISTORY:  Herniorrhaphy and total abdominal hysterectomy and bilateral salpingo-oophorectomy in August 2001 by Dr. Eda Paschal and a surgical assistant for endometriosis.  In 2001, she had a cervical lymph node removed by Dr. Suzanna Obey.  The lymph node suggested malignancy clinically but proved to be lymphadenitis and scarring.  She is a gravida 3, para 2, with one set of twins and one lost pregnancy.  FAMILY HISTORY:  Negative for GI disease, diabetes, or stroke.  Her mother had a myocardial infarction.  An uncle had cancer of the lung.  SOCIAL HISTORY:  She does not drink or smoke.  She has no pets; water source is city.  There has been no significant travel.  She has not been on antibiotics other than the cephalexin recently.  MEDICATIONS:  Her present medications include labetalol 200  mg one-half b.i.d., and estradiol.  ALLERGIES:  SULFA.  REVIEW OF SYSTEMS:  Also positive for flatulence and bloating.  She denied fever, chills, or sweats.  She had had some minimal weight loss with the present illness.  She also described profound weakness and fatigue.  On follow-up April 10, she also additionally described some heartburn in addition to the nausea and vomiting with continuing constipation.  She also has some numbness in her hands, which she attributed to circulation.  PHYSICAL EXAMINATION:  VITAL SIGNS:  Temperature was 99.3, weight 191,  respiratory rate 24, pulse 88 and regular, and blood pressure 140/94.  When seen on April 8, her weight was 205, indicating that she had lost 14 pounds in that two-day period.  GENERAL:  She appears profoundly weak, is sitting on the table slumped forward with her head lying on her chest.  HEENT:  Fundus exam revealed some copper wiring.  There was no scleral icterus or jaundice.  Tongue was coated but not dry.  The remainder of the otolaryngologic exam was unremarkable.  NECK:  There was a healed operative scar over the posterior neck.  The thyroid was small without nodularity.  CHEST:  She had minimal rales but no increased work of breathing.  CARDIAC:  An S4 gallop was noted with a pulse rate up to 105 at rest.  ABDOMEN:  Protuberant with dullness in the upper quadrants bilaterally.  She is diffusely tender without definite masses.  LYMPHATIC:  She had no lymphadenopathy about the head, neck, or axilla, although there was scar tissue over the right occipital area.  EXTREMITIES/NEUROLOGIC:  She moved slowly and required some help getting on the table, but there was no musculoskeletal deficit or neurologic deficit. Her Tinel sign was negative despite the numbness of her hands.  Radial artery pulses were normal.  SKIN:  There was slight tenting of the skin; the skin was warm and dry.  PSYCHIATRIC:  Affect is flat with minimal interaction.  ASSESSMENT:  She is now admitted with clinical ileus with nausea and vomiting, which has been unresponsive to optimal outpatient therapy.  PLAN:  She will receive IV fluids and IV Phenergan or Zofran.  Lab studies will be repeated.  GI consultation may be necessary if she fails to respond to these interventions. DD:  03/04/01 TD:  03/04/01 Job: [id number]

## 2011-04-11 NOTE — Discharge Summary (Signed)
[facility]  Patient:    Lauren Santana, Lauren Santana             MRN: [id number] Adm. Date:  [id number] Disc. Date: 03/05/01 Attending:  Dolores Patty CC:         Titus Dubin. Alwyn Ren, M.D. [facility]   Discharge Summary  DISCHARGE DIAGNOSES: 1. Gastroenteritis, clinical illness. 2. Hypertension. 3. Surgeries include, total abdominal hysterectomy, herniorrhaphy, bilateral    salpingo-oophorectomy, lymph node biopsy. 4. Hypertension.  DISCHARGE MEDICATIONS:  Labetalol at her current home dose and estradiol at her usual home dose.  CONDITION ON DISCHARGE:  Improved.  FOLLOW-UP PLANS:  Dr. Alwyn Ren, one week to follow up on abnormal CT scan.  HOSPITAL LABORATORY DATA:  Urine culture no growth.  TSH 2.146.  T4 normal at 1.25.  Urinalysis unremarkable except for 15 mg/dl ketones.  BMET was normal except for potassium of 3.4.  LFTs were normal.  Blood lipase was 11, serum amylase was 56.  CBC on admission was normal except for a platelet count of 133,000.  Abdominal ultrasound was normal.  Abdominal film demonstrated no acute abnormalities.  CT of the abdomen on March 01, 2001, appeared to demonstrate an inflammatory process of the subcutaneous tissue of the anterior abdominal wall.  Could be some underlying inflammation of the adjacent bowel loops.  It was suggested at that time that the CT be repeated with oral contrast for further assessment.  The patient refused that and she would not drink the contrast again.  HOSPITAL COURSE:  The patient was admitted to the hospital service on March 03, 2001.  The patient was treated aggressively with IV fluids, pain medications, and antinausea medications.  She improved rapidly.  She was able to tolerate a diet at the time of discharge.  CT scan of the abdomen was reviewed with her.  She understands that there were some abnormalities and if she does not clinically improve 100% then she is going to need to have  that done again.  She will follow that up with Dr. Alwyn Ren.  Hypertension:  The patient will resume her labetalol.  She was put on aclonidine patch in the hospital because of nausea.  DD:  03/05/01 TD:  03/05/01 Job: [id number]

## 2011-04-17 ENCOUNTER — Ambulatory Visit (HOSPITAL_COMMUNITY)
Admission: RE | Admit: 2011-04-17 | Discharge: 2011-04-17 | Disposition: A | Discharge status: Home / Self Care | Payer: Medicare Other | Source: Ambulatory Visit | Attending: Family Medicine | Admitting: Family Medicine

## 2011-04-17 DIAGNOSIS — M79609 Pain in unspecified limb: Secondary | ICD-10-CM | POA: Insufficient documentation

## 2011-06-23 ENCOUNTER — Ambulatory Visit: Payer: Medicaid Other | Admitting: Physical Therapy

## 2011-07-01 ENCOUNTER — Ambulatory Visit: Payer: Medicaid Other | Admitting: Physical Therapy

## 2011-07-06 ENCOUNTER — Other Ambulatory Visit: Payer: Self-pay | Admitting: Internal Medicine

## 2011-07-07 ENCOUNTER — Ambulatory Visit: Payer: Medicare Other | Attending: Family Medicine | Admitting: Physical Therapy

## 2011-07-07 ENCOUNTER — Other Ambulatory Visit: Payer: Self-pay | Admitting: Internal Medicine

## 2011-07-07 DIAGNOSIS — R5381 Other malaise: Secondary | ICD-10-CM | POA: Insufficient documentation

## 2011-07-07 DIAGNOSIS — M6281 Muscle weakness (generalized): Secondary | ICD-10-CM | POA: Insufficient documentation

## 2011-07-07 DIAGNOSIS — IMO0001 Reserved for inherently not codable concepts without codable children: Secondary | ICD-10-CM | POA: Insufficient documentation

## 2011-07-07 DIAGNOSIS — R269 Unspecified abnormalities of gait and mobility: Secondary | ICD-10-CM | POA: Insufficient documentation

## 2011-07-22 ENCOUNTER — Ambulatory Visit: Payer: Medicare Other | Admitting: Physical Therapy

## 2011-07-30 ENCOUNTER — Ambulatory Visit: Payer: Medicare Other | Attending: Family Medicine | Admitting: *Deleted

## 2011-07-30 DIAGNOSIS — R269 Unspecified abnormalities of gait and mobility: Secondary | ICD-10-CM | POA: Insufficient documentation

## 2011-07-30 DIAGNOSIS — R5381 Other malaise: Secondary | ICD-10-CM | POA: Insufficient documentation

## 2011-07-30 DIAGNOSIS — M6281 Muscle weakness (generalized): Secondary | ICD-10-CM | POA: Insufficient documentation

## 2011-07-30 DIAGNOSIS — IMO0001 Reserved for inherently not codable concepts without codable children: Secondary | ICD-10-CM | POA: Insufficient documentation

## 2011-07-31 ENCOUNTER — Ambulatory Visit: Payer: Medicare Other | Admitting: *Deleted

## 2011-08-04 ENCOUNTER — Ambulatory Visit: Payer: Medicare Other | Admitting: Physical Therapy

## 2011-08-06 ENCOUNTER — Ambulatory Visit: Payer: Medicare Other | Admitting: Physical Therapy

## 2011-08-11 ENCOUNTER — Ambulatory Visit: Payer: Medicare Other | Admitting: Physical Therapy

## 2011-08-13 ENCOUNTER — Ambulatory Visit: Payer: Medicare Other | Admitting: Physical Therapy

## 2011-08-18 ENCOUNTER — Ambulatory Visit: Payer: Medicare Other | Admitting: Physical Therapy

## 2011-08-20 ENCOUNTER — Ambulatory Visit: Payer: Medicare Other | Admitting: Physical Therapy

## 2011-08-26 ENCOUNTER — Ambulatory Visit: Payer: Medicare Other | Attending: Family Medicine | Admitting: Physical Therapy

## 2011-08-26 DIAGNOSIS — M6281 Muscle weakness (generalized): Secondary | ICD-10-CM | POA: Insufficient documentation

## 2011-08-26 DIAGNOSIS — R5381 Other malaise: Secondary | ICD-10-CM | POA: Insufficient documentation

## 2011-08-26 DIAGNOSIS — R269 Unspecified abnormalities of gait and mobility: Secondary | ICD-10-CM | POA: Insufficient documentation

## 2011-08-26 DIAGNOSIS — IMO0001 Reserved for inherently not codable concepts without codable children: Secondary | ICD-10-CM | POA: Insufficient documentation

## 2011-08-28 ENCOUNTER — Ambulatory Visit: Payer: Medicare Other | Admitting: Physical Therapy

## 2011-09-02 ENCOUNTER — Ambulatory Visit: Payer: Medicare Other | Admitting: Physical Therapy

## 2011-09-04 ENCOUNTER — Ambulatory Visit: Payer: Medicare Other | Admitting: Physical Therapy

## 2012-03-03 ENCOUNTER — Emergency Department (HOSPITAL_COMMUNITY): Payer: Medicare Other

## 2012-03-03 ENCOUNTER — Encounter (HOSPITAL_COMMUNITY): Payer: Self-pay | Admitting: *Deleted

## 2012-03-03 ENCOUNTER — Emergency Department (HOSPITAL_COMMUNITY)
Admission: EM | Admit: 2012-03-03 | Discharge: 2012-03-03 | Disposition: A | Discharge status: Home / Self Care | Payer: Medicare Other | Attending: Emergency Medicine | Admitting: Emergency Medicine

## 2012-03-03 ENCOUNTER — Emergency Department (INDEPENDENT_AMBULATORY_CARE_PROVIDER_SITE_OTHER)
Admission: EM | Admit: 2012-03-03 | Discharge: 2012-03-03 | Disposition: A | Discharge status: Nursing Home (Includes ICF) | Payer: Medicare Other | Source: Home / Self Care | Attending: Emergency Medicine | Admitting: Emergency Medicine

## 2012-03-03 DIAGNOSIS — R1110 Vomiting, unspecified: Secondary | ICD-10-CM

## 2012-03-03 DIAGNOSIS — Z79899 Other long term (current) drug therapy: Secondary | ICD-10-CM | POA: Insufficient documentation

## 2012-03-03 DIAGNOSIS — R112 Nausea with vomiting, unspecified: Secondary | ICD-10-CM

## 2012-03-03 DIAGNOSIS — R197 Diarrhea, unspecified: Secondary | ICD-10-CM | POA: Insufficient documentation

## 2012-03-03 DIAGNOSIS — I10 Essential (primary) hypertension: Secondary | ICD-10-CM | POA: Insufficient documentation

## 2012-03-03 DIAGNOSIS — E7800 Pure hypercholesterolemia, unspecified: Secondary | ICD-10-CM | POA: Insufficient documentation

## 2012-03-03 DIAGNOSIS — R109 Unspecified abdominal pain: Secondary | ICD-10-CM | POA: Insufficient documentation

## 2012-03-03 HISTORY — DX: Pure hypercholesterolemia, unspecified: E78.00

## 2012-03-03 HISTORY — DX: Unspecified osteoarthritis, unspecified site: M19.90

## 2012-03-03 HISTORY — DX: Essential (primary) hypertension: I10

## 2012-03-03 LAB — URINALYSIS, ROUTINE W REFLEX MICROSCOPIC
Bilirubin Urine: NEGATIVE
Glucose, UA: NEGATIVE mg/dL
Hgb urine dipstick: NEGATIVE
Ketones, ur: NEGATIVE mg/dL
Leukocytes, UA: NEGATIVE
Nitrite: NEGATIVE
Protein, ur: NEGATIVE mg/dL
Specific Gravity, Urine: 1.01 (ref 1.005–1.030)
Urobilinogen, UA: 0.2 mg/dL (ref 0.0–1.0)
pH: >8 — ABNORMAL HIGH (ref 5.0–8.0)

## 2012-03-03 MED ORDER — ONDANSETRON 4 MG PO TBDP
8.0000 mg | ORAL_TABLET | Freq: Once | ORAL | Status: AC
  Administered 2012-03-03: 8 mg via ORAL
  Filled 2012-03-03: qty 2

## 2012-03-03 NOTE — Discharge Instructions (Signed)
Nausea and Vomiting  Nausea is a sick feeling that often comes before throwing up (vomiting). Vomiting is a reflex where stomach contents come out of your mouth. Vomiting can cause severe loss of body fluids (dehydration). Children and elderly adults can become dehydrated quickly, especially if they also have diarrhea. Nausea and vomiting are symptoms of a condition or disease. It is important to find the cause of your symptoms.  CAUSES    Direct irritation of the stomach lining. This irritation can result from increased acid production (gastroesophageal reflux disease), infection, food poisoning, taking certain medicines (such as nonsteroidal anti-inflammatory drugs), alcohol use, or tobacco use.   Signals from the brain.These signals could be caused by a headache, heat exposure, an inner ear disturbance, increased pressure in the brain from injury, infection, a tumor, or a concussion, pain, emotional stimulus, or metabolic problems.   An obstruction in the gastrointestinal tract (bowel obstruction).   Illnesses such as diabetes, hepatitis, gallbladder problems, appendicitis, kidney problems, cancer, sepsis, atypical symptoms of a heart attack, or eating disorders.   Medical treatments such as chemotherapy and radiation.   Receiving medicine that makes you sleep (general anesthetic) during surgery.  DIAGNOSIS  Your caregiver may ask for tests to be done if the problems do not improve after a few days. Tests may also be done if symptoms are severe or if the reason for the nausea and vomiting is not clear. Tests may include:   Urine tests.   Blood tests.   Stool tests.   Cultures (to look for evidence of infection).   X-rays or other imaging studies.  Test results can help your caregiver make decisions about treatment or the need for additional tests.  TREATMENT  You need to stay well hydrated. Drink frequently but in small amounts.You may wish to drink water, sports drinks, clear broth, or eat frozen  ice pops or gelatin dessert to help stay hydrated.When you eat, eating slowly may help prevent nausea.There are also some antinausea medicines that may help prevent nausea.  HOME CARE INSTRUCTIONS    Take all medicine as directed by your caregiver.   If you do not have an appetite, do not force yourself to eat. However, you must continue to drink fluids.   If you have an appetite, eat a normal diet unless your caregiver tells you differently.   Eat a variety of complex carbohydrates (rice, wheat, potatoes, bread), lean meats, yogurt, fruits, and vegetables.   Avoid high-fat foods because they are more difficult to digest.   Drink enough water and fluids to keep your urine clear or pale yellow.   If you are dehydrated, ask your caregiver for specific rehydration instructions. Signs of dehydration may include:   Severe thirst.   Dry lips and mouth.   Dizziness.   Dark urine.   Decreasing urine frequency and amount.   Confusion.   Rapid breathing or pulse.  SEEK IMMEDIATE MEDICAL CARE IF:    You have blood or brown flecks (like coffee grounds) in your vomit.   You have black or bloody stools.   You have a severe headache or stiff neck.   You are confused.   You have severe abdominal pain.   You have chest pain or trouble breathing.   You do not urinate at least once every 8 hours.   You develop cold or clammy skin.   You continue to vomit for longer than 24 to 48 hours.   You have a fever.  MAKE SURE YOU:      Understand these instructions.   Will watch your condition.   Will get help right away if you are not doing well or get worse.  Document Released: 11/10/2005 Document Revised: 10/30/2011 Document Reviewed: 04/09/2011  [employer] Patient Information 2012 [employer].

## 2012-03-03 NOTE — ED Provider Notes (Signed)
History     CSN: [id number]  Arrival date & time 03/03/12  4098   First MD Initiated Contact with Patient 03/03/12 [phone]      Chief Complaint  Patient presents with  . Nausea  . Emesis  . Diarrhea    (Consider location/radiation/quality/duration/timing/severity/associated sxs/prior treatment) HPI Comments: Patienet started last night feeling nauseous and have vomited 6 times, since last night and have pain on my stomach (points to both epigastric area and lower abdomen). No urinary symptoms. The pian is worse this morning. i thought i had eaten to much yesterday, but the pian was not getting better this morning, dezpite vomiting  Patient is a 61 y.o. female presenting with vomiting and diarrhea. The history is provided by the patient.  Emesis  This is a new problem. The current episode started 12 to 24 hours ago. The problem occurs 5 to 10 times per day. The problem has not changed since onset.There has been no fever. Associated symptoms include abdominal pain, chills, diarrhea and sweats. Pertinent negatives include no cough, no headaches and no URI. Risk factors include suspect food intake.  Diarrhea The primary symptoms include fatigue, abdominal pain, nausea, vomiting and diarrhea. Primary symptoms do not include rash.  The illness is also significant for chills.    Past Medical History  Diagnosis Date  . Hypertension   . High cholesterol   . Arthritis     Past Surgical History  Procedure Date  . Abdominal hysterectomy   . Cesarean section     History reviewed. No pertinent family history.  History  Substance Use Topics  . Smoking status: Never Smoker   . Smokeless tobacco: Not on file  . Alcohol Use: No    OB History    Grav Para Term Preterm Abortions TAB SAB Ect Mult Living                  Review of Systems  Constitutional: Positive for chills, appetite change and fatigue.  HENT: Negative for nosebleeds and congestion.   Respiratory: Negative for cough  and shortness of breath.   Gastrointestinal: Positive for nausea, vomiting, abdominal pain and diarrhea.  Skin: Negative for rash.  Neurological: Negative for headaches.    Allergies  Other and Sulfa antibiotics  Home Medications   Current Outpatient Rx  Name Route Sig Dispense Refill  . ASPIRIN 325 MG PO TABS Oral Take 325 mg by mouth daily.    . COD LIVER OIL PO CAPS Oral Take 1 capsule by mouth daily.    Marland Kitchen GABAPENTIN 300 MG PO CAPS Oral Take 300 mg by mouth 3 (three) times daily.    Marland Kitchen LABETALOL HCL 100 MG PO TABS Oral Take 100 mg by mouth 2 (two) times daily.    Marland Kitchen LISINOPRIL 10 MG PO TABS Oral Take 10 mg by mouth daily.    . MELOXICAM 15 MG PO TABS Oral Take 15 mg by mouth daily.    Marland Kitchen PRAVASTATIN SODIUM 20 MG PO TABS Oral Take 20 mg by mouth daily.    . TORSEMIDE 20 MG PO TABS Oral Take 20 mg by mouth daily.    . TRAMADOL HCL 50 MG PO TABS Oral Take 50 mg by mouth every 6 (six) hours as needed. For pain      BP 147/67  Pulse 93  Temp(Src) 99 F (37.2 C) (Oral)  Resp 18  SpO2 99%  Physical Exam  Constitutional: She appears well-developed and well-nourished. No distress.  HENT:  Head: Normocephalic.  Mouth/Throat: No oropharyngeal exudate.  Eyes: No scleral icterus.  Pulmonary/Chest: No respiratory distress.  Abdominal: Soft. She exhibits no distension and no mass. There is tenderness. There is no rigidity, no rebound and no guarding.    Skin: No erythema.    ED Course  Procedures (including critical care time)  Labs Reviewed - No data to display Dg Abd Acute W/chest  03/03/2012  *RADIOLOGY REPORT*  Clinical Data: Nausea, vomiting and abdominal pain.  ACUTE ABDOMEN SERIES (ABDOMEN 2 VIEW & CHEST 1 VIEW)  Comparison: No priors.  Findings: Lung volumes are normal.  No consolidative airspace disease.  No pleural effusions.  Pulmonary vasculature is normal. Heart size is mildly enlarged.  Mediastinal contours are unremarkable.  No pneumoperitoneum.  Supine and upright  views of the abdomen demonstrates some gas and stool scattered throughout the colon extending to the level of the distal rectum.  No pathologic distension of small bowel is evident. Numerous phleboliths are noted throughout the anatomic pelvis.  IMPRESSION: 1.  Nonobstructive bowel gas pattern. 2.  No pneumoperitoneum. 3.  No radiographic evidence of acute cardiopulmonary disease. 4.  Mild cardiomegaly.  Original Report Authenticated By: Florencia Reasons, M.D.     1. Abdominal pain   2. Vomiting       MDM  Sudden onset abdominal pain with N, V and diarrheas- On exam patient seem to be uncomfortable with minimal ambulation and was unable to tolerate supine position. Reproducible pain predominantly RLQ.        Jimmie Molly, MD 03/03/12 2113

## 2012-03-03 NOTE — ED Notes (Signed)
Pt sent here from [facility], had sudden onset of n/v and RLQ pain, r/o appendicitis.

## 2012-03-03 NOTE — ED Provider Notes (Signed)
History     CSN: [id number]  Arrival date & time 03/03/12  1035   First MD Initiated Contact with Patient 03/03/12 1058      Chief Complaint  Patient presents with  . Emesis  . Abdominal Pain    (Consider location/radiation/quality/duration/timing/severity/associated sxs/prior treatment) HPI  61 year old female presents with the chief complaints of abdominal pain with associated nausea, vomiting, and diarrhea. Patient states yesterday she had consumed a large amount of food. In the morning she ate pan cake, shortly after her friends invited her out to [employer].  Sts she spurged by eating a large amount of food.  She then went home and consume icescream for dinner.  Sts afterward she felt nauseated.  She attempt to vomit and were successful in vomiting 5-6 times of non bloody, non bilious stomach content.  Sts later on during the night she has 2 bouts of non bloody diarrhea.  Sts it was small BM. This AM she felt nauseated and decided to come to urgent care.  However, [facility] sent pt to ER to r/o for appendicitis.  Pt currently sts she does not have any significant abd pain.  She has appetite.  Denies fever, chills, cp, sob, back pain, dysurea.  Pt does have prior hx of abdominal hysterectomy and C-section.    Past Medical History  Diagnosis Date  . Hypertension   . High cholesterol   . Arthritis     Past Surgical History  Procedure Date  . Abdominal hysterectomy   . Cesarean section     History reviewed. No pertinent family history.  History  Substance Use Topics  . Smoking status: Never Smoker   . Smokeless tobacco: Not on file  . Alcohol Use: No    OB History    Grav Para Term Preterm Abortions TAB SAB Ect Mult Living                  Review of Systems  All other systems reviewed and are negative.    Allergies  Other and Sulfa antibiotics  Home Medications   Current Outpatient Rx  Name Route Sig Dispense Refill  . ASPIRIN 325 MG PO TABS Oral Take  325 mg by mouth daily.    . COD LIVER OIL PO CAPS Oral Take 1 capsule by mouth daily.    Marland Kitchen GABAPENTIN 300 MG PO CAPS Oral Take 300 mg by mouth 3 (three) times daily.    Marland Kitchen LABETALOL HCL 100 MG PO TABS Oral Take 100 mg by mouth 2 (two) times daily.    Marland Kitchen LISINOPRIL 10 MG PO TABS Oral Take 10 mg by mouth daily.    . MELOXICAM 15 MG PO TABS Oral Take 15 mg by mouth daily.    Marland Kitchen PRAVASTATIN SODIUM 20 MG PO TABS Oral Take 20 mg by mouth daily.    . TORSEMIDE 20 MG PO TABS Oral Take 20 mg by mouth daily.    . TRAMADOL HCL 50 MG PO TABS Oral Take 50 mg by mouth every 6 (six) hours as needed. For pain      BP 126/65  Pulse 89  Temp(Src) 99.5 F (37.5 C) (Oral)  Resp 20  SpO2 95%  Physical Exam  Nursing note and vitals reviewed. Constitutional: She appears well-developed and well-nourished. No distress.       Awake, alert, nontoxic appearance  HENT:  Head: Atraumatic.  Mouth/Throat: Oropharynx is clear and moist. No oropharyngeal exudate.  Eyes: Conjunctivae are normal. Right eye exhibits no  discharge. Left eye exhibits no discharge.  Neck: Neck supple.  Cardiovascular: Normal rate and regular rhythm.   Pulmonary/Chest: Effort normal. No respiratory distress. She exhibits no tenderness.  Abdominal: Soft. There is no tenderness. There is no rigidity, no rebound, no guarding, no tenderness at McBurney's point and negative Murphy's sign. No hernia. Hernia confirmed negative in the ventral area.  Musculoskeletal: She exhibits no tenderness.       ROM appears intact, no obvious focal weakness  Neurological:       Mental status and motor strength appears intact  Skin: No rash noted.  Psychiatric: She has a normal mood and affect.    ED Course  Procedures (including critical care time)  Labs Reviewed - No data to display No results found.   No diagnosis found.  Results for orders placed during the hospital encounter of 03/03/12  URINALYSIS, ROUTINE W REFLEX MICROSCOPIC      Component  Value Range   Color, Urine YELLOW  YELLOW    APPearance CLEAR  CLEAR    Specific Gravity, Urine 1.010  1.005 - 1.030    pH >8.0 (*) 5.0 - 8.0    Glucose, UA NEGATIVE  NEGATIVE (mg/dL)   Hgb urine dipstick NEGATIVE  NEGATIVE    Bilirubin Urine NEGATIVE  NEGATIVE    Ketones, ur NEGATIVE  NEGATIVE (mg/dL)   Protein, ur NEGATIVE  NEGATIVE (mg/dL)   Urobilinogen, UA 0.2  0.0 - 1.0 (mg/dL)   Nitrite NEGATIVE  NEGATIVE    Leukocytes, UA NEGATIVE  NEGATIVE    Dg Abd Acute W/chest  03/03/2012  *RADIOLOGY REPORT*  Clinical Data: Nausea, vomiting and abdominal pain.  ACUTE ABDOMEN SERIES (ABDOMEN 2 VIEW & CHEST 1 VIEW)  Comparison: No priors.  Findings: Lung volumes are normal.  No consolidative airspace disease.  No pleural effusions.  Pulmonary vasculature is normal. Heart size is mildly enlarged.  Mediastinal contours are unremarkable.  No pneumoperitoneum.  Supine and upright views of the abdomen demonstrates some gas and stool scattered throughout the colon extending to the level of the distal rectum.  No pathologic distension of small bowel is evident. Numerous phleboliths are noted throughout the anatomic pelvis.  IMPRESSION: 1.  Nonobstructive bowel gas pattern. 2.  No pneumoperitoneum. 3.  No radiographic evidence of acute cardiopulmonary disease. 4.  Mild cardiomegaly.  Original Report Authenticated By: Florencia Reasons, M.D.      MDM  Pt's only complaint is mild nausea, she does not think she has appendicitis.  Pt think it's likely from overeating.  On exam, she has a benign abdomen.  Pt has stable normal VS.  Will give antiemetic and will obtain acute abd series  To r/o obstruction as she has prior hx of abd surgery.  I have very low suspicion of appendicitis.   2:29 PM Acute abdomen x-rays shows no acute finding. No evidence of obstruction. Patient denies any abdominal pain this time. She has no nausea. Abdomen is nonsurgical. She is able to tolerate by mouth without difficulty. She has  normal stable vital signs. She felt comfortable to be discharged. I recommend for her to return promptly if symptoms worsen. Patient voiced understanding and agrees with plan. Patient also agrees to follow up with her primary care Dr.      Fayrene Helper, PA-C 03/03/12 1430

## 2012-03-03 NOTE — ED Notes (Signed)
Pt with onset of n/v/d last night 9pm - vomited x 6 times /diarrhea x 2 to 3 - cramping abdominal pain - denies urinary symptoms -

## 2012-03-04 NOTE — ED Provider Notes (Signed)
GI: + BS, SNTND  Medical screening examination/treatment/procedure(s) were conducted as a shared visit with non-physician practitioner(s) and myself.  I personally evaluated the patient during the encounter  Cyndra Numbers, MD 03/04/12 1313

## 2012-03-10 ENCOUNTER — Encounter: Payer: Self-pay | Admitting: Obstetrics & Gynecology

## 2012-03-10 ENCOUNTER — Ambulatory Visit (INDEPENDENT_AMBULATORY_CARE_PROVIDER_SITE_OTHER): Payer: Medicare Other | Admitting: Obstetrics & Gynecology

## 2012-03-10 VITALS — BP 136/70 | HR 98 | Temp 98.9°F | Resp 20 | Ht 61.0 in | Wt 250.8 lb

## 2012-03-10 DIAGNOSIS — I10 Essential (primary) hypertension: Secondary | ICD-10-CM

## 2012-03-10 DIAGNOSIS — N809 Endometriosis, unspecified: Secondary | ICD-10-CM

## 2012-03-10 DIAGNOSIS — N950 Postmenopausal bleeding: Secondary | ICD-10-CM

## 2012-03-10 NOTE — Progress Notes (Signed)
  Subjective:    Patient ID: Lauren Santana, female    DOB: 10-30-1951, 61 y.o.   MRN: [id number]  HPI Patient referred from [facility] for presumed vaginal spotting.  Patient has had a total hysterectomy in 1988 and then her ovaries removed in 2001.  She noticed some spotting on her underwear about 1 year ago. Resolved spontaneously after a few days. Then 3 months ago, she had a similar episode and has intermittent spotting since then. She has had no spotting for about the past week.  Review of Systems    Objective:   Physical Exam Gen: NAD Psych: engaged, appropriate GU:   Vulva: no lesion, redness, tenderness   Vagina: atrophy with minimal rugae; some mild bruising on vaginal wall; no cervix Rectum: no blood or other external abnormality    Assessment & Plan:  This is a 61 year old female with a history of partial hysterectomy (does not have cervix) and then bilateral salipingo-oophorectomy presenting with about a year of intermittent vaginal spotting, last episode resolved about 1 week ago.  Only chronic medical problem is hypertension.   -Bleeding seems most likely due to vaginal dryness/atrophy at this time. Vaginal wall is very sensitive and had mild bleeding even with speculum exam.  -Will check pelvic ultrasound to rule-out structural abnormality -Will follow-up after ultrasound.  -If bleeding persists, may benefit from vaginal estrogen likely Estring   Agree with above. Jaynie Collins, M.D. 03/10/2012 4:57 PM

## 2012-03-10 NOTE — Patient Instructions (Signed)
Postmenopausal Bleeding Menopause is commonly referred to as the "change in life." It is a time when the fertile years, the time of ovulating and having menstrual periods, has come to an end. It is also determined by not having menstrual periods for 12 months.  Postmenopausal bleeding is any bleeding a woman has after she has entered into menopause. Any type of postmenopausal bleeding, even if it appears to be a typical menstrual period, is concerning. This should be evaluated by your caregiver.  CAUSES   Hormone therapy.   Cancer of the cervix or cancer of the lining of the uterus (endometrial cancer).   Thinning of the uterine lining (uterine atrophy).   Thyroid diseases.   Certain medicines.   Infection of the uterus or cervix.   Inflammation or irritation of the uterine lining (endometritis).   Estrogen-secreting tumors.   Growths (polyps) on the cervix, uterine lining, or uterus.   Uterine tumors (fibroids).   Being very overweight (obese).  DIAGNOSIS  Your caregiver will take a medical history and ask questions. A physical exam will also be performed. Further tests may include:   A transvaginal ultrasound. An ultrasound wand or probe is inserted into your vagina to view the pelvic organs.   A biopsy of the lining of the uterus (endometrium). A sample of the endometrium is removed and examined.   A hysteroscopy. Your caregiver may use an instrument with a light and a camera attached to it (hysteroscope). The hysteroscope is used to look inside the uterus for problems.   A dilation and curettage (D&C). Tissue is removed from the uterine lining to be examined for problems.  TREATMENT  Treatment depends on the cause of the bleeding. Some treatments include:   Surgery.   Medicines.   Hormones.   A hysteroscopy or D&C to remove polyps or fibroids.   Changing or stopping a current medicine you are taking.  Talk to your caregiver about your specific treatment. HOME CARE  INSTRUCTIONS   Maintain a healthy weight.   Keep regular pelvic exams and Pap tests.  SEEK MEDICAL CARE IF:   You have bleeding, even if it is light in comparison to your previous periods.   Your bleeding lasts more than 1 week.   You have abdominal pain.   You develop bleeding with sexual intercourse.  SEEK IMMEDIATE MEDICAL CARE IF:   You have a fever, chills, headache, dizziness, muscle aches, and bleeding.   You have severe pain with bleeding.   You are passing blood clots.   You have bleeding and need more than 1 pad an hour.   You feel faint.  MAKE SURE YOU:  Understand these instructions.   Will watch your condition.   Will get help right away if you are not doing well or get worse.  Document Released: 02/18/2006 Document Revised: 10/30/2011 Document Reviewed: 07/17/2011 [employer] Patient Information 2012 [employer]. 

## 2012-03-16 ENCOUNTER — Ambulatory Visit (HOSPITAL_COMMUNITY)
Admission: RE | Admit: 2012-03-16 | Discharge: 2012-03-16 | Disposition: A | Discharge status: Home / Self Care | Payer: Medicare Other | Source: Ambulatory Visit | Attending: Obstetrics & Gynecology | Admitting: Obstetrics & Gynecology

## 2012-03-16 DIAGNOSIS — Z90710 Acquired absence of both cervix and uterus: Secondary | ICD-10-CM | POA: Insufficient documentation

## 2012-03-16 DIAGNOSIS — N950 Postmenopausal bleeding: Secondary | ICD-10-CM

## 2012-04-12 ENCOUNTER — Encounter: Payer: Self-pay | Admitting: *Deleted

## 2012-05-14 ENCOUNTER — Ambulatory Visit: Payer: Medicare Other | Attending: Family Medicine | Admitting: Physical Therapy

## 2012-05-14 DIAGNOSIS — R269 Unspecified abnormalities of gait and mobility: Secondary | ICD-10-CM | POA: Insufficient documentation

## 2012-05-14 DIAGNOSIS — IMO0001 Reserved for inherently not codable concepts without codable children: Secondary | ICD-10-CM | POA: Insufficient documentation

## 2012-05-19 ENCOUNTER — Ambulatory Visit: Payer: Medicare Other | Admitting: Physical Therapy

## 2012-05-21 ENCOUNTER — Ambulatory Visit: Payer: Medicare Other | Admitting: Physical Therapy

## 2012-05-24 ENCOUNTER — Ambulatory Visit: Payer: Medicare Other | Attending: Family Medicine | Admitting: Physical Therapy

## 2012-05-24 DIAGNOSIS — R269 Unspecified abnormalities of gait and mobility: Secondary | ICD-10-CM | POA: Insufficient documentation

## 2012-05-24 DIAGNOSIS — IMO0001 Reserved for inherently not codable concepts without codable children: Secondary | ICD-10-CM | POA: Insufficient documentation

## 2012-05-26 ENCOUNTER — Ambulatory Visit: Payer: Medicare Other | Admitting: Physical Therapy

## 2012-06-02 ENCOUNTER — Ambulatory Visit: Payer: Medicare Other | Admitting: Physical Therapy

## 2012-06-04 ENCOUNTER — Ambulatory Visit: Payer: Medicare Other | Admitting: Physical Therapy

## 2012-06-09 ENCOUNTER — Ambulatory Visit: Payer: Medicare Other | Admitting: Physical Therapy

## 2012-06-11 ENCOUNTER — Ambulatory Visit: Payer: Medicare Other | Admitting: Physical Therapy

## 2012-06-16 ENCOUNTER — Ambulatory Visit: Payer: Medicare Other | Admitting: Physical Therapy

## 2012-06-18 ENCOUNTER — Ambulatory Visit: Payer: Medicare Other | Admitting: Physical Therapy

## 2012-06-23 ENCOUNTER — Ambulatory Visit: Payer: Medicare Other | Admitting: Physical Therapy

## 2012-06-25 ENCOUNTER — Ambulatory Visit: Payer: Medicare Other | Attending: Family Medicine | Admitting: Physical Therapy

## 2012-06-25 DIAGNOSIS — IMO0001 Reserved for inherently not codable concepts without codable children: Secondary | ICD-10-CM | POA: Insufficient documentation

## 2012-06-25 DIAGNOSIS — R269 Unspecified abnormalities of gait and mobility: Secondary | ICD-10-CM | POA: Insufficient documentation

## 2012-06-30 ENCOUNTER — Ambulatory Visit: Payer: Medicare Other | Admitting: *Deleted

## 2012-06-30 ENCOUNTER — Ambulatory Visit: Payer: Medicare Other | Admitting: Physical Therapy

## 2012-07-02 ENCOUNTER — Ambulatory Visit: Payer: Medicare Other | Admitting: Physical Therapy

## 2012-07-07 ENCOUNTER — Ambulatory Visit: Payer: Medicare Other | Admitting: *Deleted

## 2012-07-09 ENCOUNTER — Ambulatory Visit: Payer: Medicare Other | Admitting: Physical Therapy

## 2012-09-27 ENCOUNTER — Other Ambulatory Visit: Payer: Self-pay | Admitting: Family Medicine

## 2012-09-27 DIAGNOSIS — M545 Low back pain, unspecified: Secondary | ICD-10-CM

## 2012-09-28 ENCOUNTER — Ambulatory Visit
Admission: RE | Admit: 2012-09-28 | Discharge: 2012-09-28 | Disposition: A | Discharge status: Home / Self Care | Payer: Medicare Other | Source: Ambulatory Visit | Attending: Family Medicine | Admitting: Family Medicine

## 2012-09-28 DIAGNOSIS — M545 Low back pain, unspecified: Secondary | ICD-10-CM

## 2012-10-14 ENCOUNTER — Other Ambulatory Visit (HOSPITAL_COMMUNITY)
Admission: RE | Admit: 2012-10-14 | Discharge: 2012-10-14 | Disposition: A | Discharge status: Home / Self Care | Payer: Medicare Other | Source: Ambulatory Visit | Attending: Family Medicine | Admitting: Family Medicine

## 2012-10-14 ENCOUNTER — Other Ambulatory Visit: Payer: Self-pay | Admitting: Family Medicine

## 2012-10-14 DIAGNOSIS — Z124 Encounter for screening for malignant neoplasm of cervix: Secondary | ICD-10-CM | POA: Insufficient documentation

## 2012-10-14 DIAGNOSIS — Z1151 Encounter for screening for human papillomavirus (HPV): Secondary | ICD-10-CM | POA: Insufficient documentation

## 2013-07-05 ENCOUNTER — Other Ambulatory Visit: Payer: Self-pay | Admitting: Family Medicine

## 2013-07-05 ENCOUNTER — Other Ambulatory Visit (HOSPITAL_COMMUNITY)
Admission: RE | Admit: 2013-07-05 | Discharge: 2013-07-05 | Disposition: A | Discharge status: Home / Self Care | Payer: Medicare Other | Source: Ambulatory Visit | Attending: Family Medicine | Admitting: Family Medicine

## 2013-07-05 DIAGNOSIS — Z113 Encounter for screening for infections with a predominantly sexual mode of transmission: Secondary | ICD-10-CM | POA: Insufficient documentation

## 2014-05-30 ENCOUNTER — Other Ambulatory Visit: Payer: Self-pay | Admitting: Orthopedic Surgery

## 2014-06-16 ENCOUNTER — Other Ambulatory Visit (HOSPITAL_COMMUNITY): Payer: Medicare Other

## 2014-07-07 ENCOUNTER — Ambulatory Visit (HOSPITAL_COMMUNITY)
Admission: RE | Admit: 2014-07-07 | Discharge: 2014-07-07 | Disposition: A | Discharge status: Home / Self Care | Payer: Medicare HMO | Source: Ambulatory Visit | Attending: Orthopedic Surgery | Admitting: Orthopedic Surgery

## 2014-07-07 ENCOUNTER — Encounter (HOSPITAL_COMMUNITY)
Admission: RE | Admit: 2014-07-07 | Discharge: 2014-07-07 | Disposition: A | Discharge status: Home / Self Care | Payer: Medicare HMO | Source: Ambulatory Visit | Attending: Orthopedic Surgery | Admitting: Orthopedic Surgery

## 2014-07-07 ENCOUNTER — Encounter (HOSPITAL_COMMUNITY): Payer: Self-pay

## 2014-07-07 DIAGNOSIS — Z01818 Encounter for other preprocedural examination: Secondary | ICD-10-CM | POA: Diagnosis present

## 2014-07-07 DIAGNOSIS — I10 Essential (primary) hypertension: Secondary | ICD-10-CM | POA: Insufficient documentation

## 2014-07-07 HISTORY — DX: Presence of spectacles and contact lenses: Z97.3

## 2014-07-07 HISTORY — DX: Edema, unspecified: R60.9

## 2014-07-07 HISTORY — DX: Anesthesia of skin: R20.0

## 2014-07-07 LAB — URINE MICROSCOPIC-ADD ON

## 2014-07-07 LAB — URINALYSIS, ROUTINE W REFLEX MICROSCOPIC
Bilirubin Urine: NEGATIVE
Glucose, UA: NEGATIVE mg/dL
Ketones, ur: NEGATIVE mg/dL
Leukocytes, UA: NEGATIVE
Nitrite: NEGATIVE
Protein, ur: NEGATIVE mg/dL
Specific Gravity, Urine: 1.017 (ref 1.005–1.030)
Urobilinogen, UA: 0.2 mg/dL (ref 0.0–1.0)
pH: 6.5 (ref 5.0–8.0)

## 2014-07-07 LAB — PROTIME-INR
INR: 0.97 (ref 0.00–1.49)
Prothrombin Time: 12.9 seconds (ref 11.6–15.2)

## 2014-07-07 LAB — CBC WITH DIFFERENTIAL/PLATELET
Basophils Absolute: 0 10*3/uL (ref 0.0–0.1)
Basophils Relative: 1 % (ref 0–1)
Eosinophils Absolute: 0.2 10*3/uL (ref 0.0–0.7)
Eosinophils Relative: 4 % (ref 0–5)
HCT: 37 % (ref 36.0–46.0)
Hemoglobin: 12 g/dL (ref 12.0–15.0)
Lymphocytes Relative: 38 % (ref 12–46)
Lymphs Abs: 2.1 10*3/uL (ref 0.7–4.0)
MCH: 32 pg (ref 26.0–34.0)
MCHC: 32.4 g/dL (ref 30.0–36.0)
MCV: 98.7 fL (ref 78.0–100.0)
Monocytes Absolute: 0.7 10*3/uL (ref 0.1–1.0)
Monocytes Relative: 12 % (ref 3–12)
Neutro Abs: 2.6 10*3/uL (ref 1.7–7.7)
Neutrophils Relative %: 47 % (ref 43–77)
Platelets: 142 10*3/uL — ABNORMAL LOW (ref 150–400)
RBC: 3.75 MIL/uL — ABNORMAL LOW (ref 3.87–5.11)
RDW: 13.5 % (ref 11.5–15.5)
WBC: 5.6 10*3/uL (ref 4.0–10.5)

## 2014-07-07 LAB — BASIC METABOLIC PANEL
Anion gap: 14 (ref 5–15)
BUN: 10 mg/dL (ref 6–23)
CO2: 26 mEq/L (ref 19–32)
Calcium: 9.6 mg/dL (ref 8.4–10.5)
Chloride: 102 mEq/L (ref 96–112)
Creatinine, Ser: 0.95 mg/dL (ref 0.50–1.10)
GFR calc Af Amer: 72 mL/min — ABNORMAL LOW (ref >90)
GFR calc non Af Amer: 62 mL/min — ABNORMAL LOW (ref >90)
Glucose, Bld: 79 mg/dL (ref 70–99)
Potassium: 4 mEq/L (ref 3.7–5.3)
Sodium: 142 mEq/L (ref 137–147)

## 2014-07-07 LAB — TYPE AND SCREEN
ABO/RH(D): O POS
Antibody Screen: NEGATIVE

## 2014-07-07 LAB — SURGICAL PCR SCREEN
MRSA, PCR: NEGATIVE
Staphylococcus aureus: NEGATIVE

## 2014-07-07 LAB — APTT: aPTT: 32 seconds (ref 24–37)

## 2014-07-07 LAB — ABO/RH: ABO/RH(D): O POS

## 2014-07-10 NOTE — Progress Notes (Signed)
Spoke with Ivin Booty at Dr. Damita Dunnings office to make MD aware that pt c/o "spotting" and had an abnormal UA.

## 2014-07-11 ENCOUNTER — Encounter (HOSPITAL_COMMUNITY): Payer: Self-pay | Admitting: Pharmacy Technician

## 2014-07-13 NOTE — H&P (Signed)
TOTAL KNEE ADMISSION H&P  Patient is being admitted for left total knee arthroplasty.  Subjective:  Chief Complaint:left knee pain.  HPI: Lauren Santana, 63 y.o. female, has a history of pain and functional disability in the left knee due to arthritis and has failed non-surgical conservative treatments for greater than 12 weeks to includeNSAID's and/or analgesics, corticosteriod injections, use of assistive devices and activity modification.  Onset of symptoms was gradual, starting 2 years ago with gradually worsening course since that time. The patient noted no past surgery on the left knee(s).  Patient currently rates pain in the left knee(s) at 10 out of 10 with activity. Patient has worsening of pain with activity and weight bearing, pain that interferes with activities of daily living, pain with passive range of motion and crepitus.  Patient has evidence of joint subluxation and joint space narrowing by imaging studies. There is no active infection.  Patient Active Problem List   Diagnosis Date Noted  . Postmenopausal bleeding 03/10/2012  . HYPERTENSION 01/22/2007  . ENDOMETRIOSIS 01/22/2007   Past Medical History  Diagnosis Date  . Hypertension   . High cholesterol   . Arthritis   . Wears glasses   . Numbness     B/LLE  . Edema     B/LLE    Past Surgical History  Procedure Laterality Date  . Cesarean section  1969, 1971  . Bilateral salpingoophorectomy  2001  . Hernia repair  2001    incisional  . Multiple tooth extractions  2011  . Colonoscopy      No prescriptions prior to admission   Allergies  Allergen Reactions  . Sulfa Antibiotics Other (See Comments)    Unknown allergic reaction    History  Substance Use Topics  . Smoking status: Never Smoker   . Smokeless tobacco: Never Used  . Alcohol Use: No    No family history on file.   Review of Systems  Constitutional: Negative.   HENT: Negative.   Eyes: Negative.   Respiratory: Negative.    Cardiovascular: Positive for leg swelling.  Gastrointestinal: Negative.   Genitourinary: Negative.   Musculoskeletal: Positive for joint pain.  Skin: Negative.   Neurological: Negative.   Endo/Heme/Allergies: Negative.   Psychiatric/Behavioral: Negative.     Objective:  Physical Exam  Constitutional: She is oriented to person, place, and time. She appears well-developed and well-nourished.  HENT:  Head: Normocephalic and atraumatic.  Neck: Normal range of motion. Neck supple.  Cardiovascular: Intact distal pulses.   Respiratory: Effort normal.  Musculoskeletal: She exhibits tenderness.  Her toes are warm and well-perfused.  Both knees have impressive varus deformities of 5-10, they both lacks 5-10 of full extension and flexes 115 limited by adipose tissue.    Neurological: She is alert and oriented to person, place, and time.  Skin: Skin is warm and dry.  Psychiatric: She has a normal mood and affect. Her behavior is normal. Judgment and thought content normal.    Vital signs in last 24 hours:    Labs:   Estimated body mass index is 47.41 kg/(m^2) as calculated from the following:   Height as of 03/10/12: 5\' 1"  (1.549 m).   Weight as of 03/10/12: 113.762 kg (250 lb 12.8 oz).   Imaging Review Plain radiographs show impressive arthritis medial compartment bone-on-bone lateral subluxation of tibias beneath the femurs about 5 mm.  Assessment/Plan:  End stage arthritis, left knee   The patient history, physical examination, clinical judgment of the provider and imaging studies  are consistent with end stage degenerative joint disease of the left knee(s) and total knee arthroplasty is deemed medically necessary. The treatment options including medical management, injection therapy arthroscopy and arthroplasty were discussed at length. The risks and benefits of total knee arthroplasty were presented and reviewed. The risks due to aseptic loosening, infection, stiffness, patella  tracking problems, thromboembolic complications and other imponderables were discussed. The patient acknowledged the explanation, agreed to proceed with the plan and consent was signed. Patient is being admitted for inpatient treatment for surgery, pain control, PT, OT, prophylactic antibiotics, VTE prophylaxis, progressive ambulation and ADL's and discharge planning. The patient is planning to be discharged to inpatient rehab

## 2014-07-16 MED ORDER — TRANEXAMIC ACID 100 MG/ML IV SOLN
1000.0000 mg | INTRAVENOUS | Status: AC
  Administered 2014-07-17: 1000 mg via INTRAVENOUS
  Filled 2014-07-16: qty 10

## 2014-07-16 MED ORDER — BUPIVACAINE LIPOSOME 1.3 % IJ SUSP
20.0000 mL | Freq: Once | INTRAMUSCULAR | Status: AC
  Administered 2014-07-17: 20 mL
  Filled 2014-07-16: qty 20

## 2014-07-16 MED ORDER — DEXTROSE-NACL 5-0.45 % IV SOLN: INTRAVENOUS | Status: DC

## 2014-07-16 MED ORDER — CHLORHEXIDINE GLUCONATE 4 % EX LIQD
60.0000 mL | Freq: Once | CUTANEOUS | Status: DC
  Filled 2014-07-16: qty 60

## 2014-07-16 MED ORDER — CEFAZOLIN SODIUM-DEXTROSE 2-3 GM-% IV SOLR
2.0000 g | INTRAVENOUS | Status: AC
  Administered 2014-07-17: 2 g via INTRAVENOUS
  Filled 2014-07-16: qty 50

## 2014-07-17 ENCOUNTER — Encounter (HOSPITAL_COMMUNITY): Payer: Self-pay | Admitting: *Deleted

## 2014-07-17 ENCOUNTER — Inpatient Hospital Stay (HOSPITAL_COMMUNITY)
Admission: RE | Admit: 2014-07-17 | Discharge: 2014-07-20 | DRG: 470 | Disposition: A | Discharge status: Skilled Nursing Facility | Payer: Medicare HMO | Source: Ambulatory Visit | Attending: Orthopedic Surgery | Admitting: Orthopedic Surgery

## 2014-07-17 ENCOUNTER — Encounter (HOSPITAL_COMMUNITY)
Admission: RE | Disposition: A | Discharge status: Skilled Nursing Facility | Payer: Self-pay | Source: Ambulatory Visit | Attending: Orthopedic Surgery

## 2014-07-17 ENCOUNTER — Encounter (HOSPITAL_COMMUNITY): Payer: Medicare HMO | Admitting: Anesthesiology

## 2014-07-17 ENCOUNTER — Inpatient Hospital Stay (HOSPITAL_COMMUNITY): Payer: Medicare HMO | Admitting: Anesthesiology

## 2014-07-17 DIAGNOSIS — D62 Acute posthemorrhagic anemia: Secondary | ICD-10-CM | POA: Diagnosis not present

## 2014-07-17 DIAGNOSIS — Z882 Allergy status to sulfonamides status: Secondary | ICD-10-CM | POA: Diagnosis not present

## 2014-07-17 DIAGNOSIS — M1710 Unilateral primary osteoarthritis, unspecified knee: Principal | ICD-10-CM | POA: Diagnosis present

## 2014-07-17 DIAGNOSIS — E7800 Pure hypercholesterolemia, unspecified: Secondary | ICD-10-CM | POA: Diagnosis present

## 2014-07-17 DIAGNOSIS — Z6841 Body Mass Index (BMI) 40.0 and over, adult: Secondary | ICD-10-CM

## 2014-07-17 DIAGNOSIS — I10 Essential (primary) hypertension: Secondary | ICD-10-CM | POA: Diagnosis present

## 2014-07-17 DIAGNOSIS — M25569 Pain in unspecified knee: Secondary | ICD-10-CM | POA: Diagnosis present

## 2014-07-17 HISTORY — PX: TOTAL KNEE ARTHROPLASTY: SHX125

## 2014-07-17 SURGERY — ARTHROPLASTY, KNEE, TOTAL
Anesthesia: General | Site: Knee | Laterality: Left

## 2014-07-17 MED ORDER — OXYCODONE HCL 5 MG PO TABS
ORAL_TABLET | ORAL | Status: AC
  Filled 2014-07-17: qty 1

## 2014-07-17 MED ORDER — HYDROMORPHONE HCL PF 1 MG/ML IJ SOLN
INTRAMUSCULAR | Status: AC
  Filled 2014-07-17: qty 1

## 2014-07-17 MED ORDER — SENNOSIDES-DOCUSATE SODIUM 8.6-50 MG PO TABS: 1.0000 | ORAL_TABLET | Freq: Every evening | ORAL | Status: DC | PRN

## 2014-07-17 MED ORDER — ONDANSETRON HCL 4 MG/2ML IJ SOLN: 4.0000 mg | Freq: Once | INTRAMUSCULAR | Status: DC | PRN

## 2014-07-17 MED ORDER — ROCURONIUM BROMIDE 100 MG/10ML IV SOLN
INTRAVENOUS | Status: DC | PRN
  Administered 2014-07-17: 30 mg via INTRAVENOUS

## 2014-07-17 MED ORDER — LABETALOL HCL 100 MG PO TABS
100.0000 mg | ORAL_TABLET | Freq: Two times a day (BID) | ORAL | Status: DC
  Administered 2014-07-17 – 2014-07-20 (×3): 100 mg via ORAL
  Filled 2014-07-17 (×8): qty 1

## 2014-07-17 MED ORDER — MIDAZOLAM HCL 5 MG/5ML IJ SOLN
INTRAMUSCULAR | Status: DC | PRN
  Administered 2014-07-17 (×2): 1 mg via INTRAVENOUS

## 2014-07-17 MED ORDER — LISINOPRIL 10 MG PO TABS
10.0000 mg | ORAL_TABLET | Freq: Every day | ORAL | Status: DC
  Administered 2014-07-17: 10 mg via ORAL
  Filled 2014-07-17 (×4): qty 1

## 2014-07-17 MED ORDER — LIDOCAINE HCL (CARDIAC) 20 MG/ML IV SOLN
INTRAVENOUS | Status: DC | PRN
  Administered 2014-07-17: 100 mg via INTRAVENOUS

## 2014-07-17 MED ORDER — FLEET ENEMA 7-19 GM/118ML RE ENEM: 1.0000 | ENEMA | Freq: Once | RECTAL | Status: AC | PRN

## 2014-07-17 MED ORDER — OXYCODONE HCL 5 MG PO TABS
5.0000 mg | ORAL_TABLET | Freq: Once | ORAL | Status: AC | PRN
  Administered 2014-07-17: 5 mg via ORAL

## 2014-07-17 MED ORDER — DIPHENHYDRAMINE HCL 12.5 MG/5ML PO ELIX
12.5000 mg | ORAL_SOLUTION | ORAL | Status: DC | PRN
  Filled 2014-07-17: qty 10

## 2014-07-17 MED ORDER — ONDANSETRON HCL 4 MG/2ML IJ SOLN: 4.0000 mg | Freq: Four times a day (QID) | INTRAMUSCULAR | Status: DC | PRN

## 2014-07-17 MED ORDER — METOCLOPRAMIDE HCL 5 MG/ML IJ SOLN: 5.0000 mg | Freq: Three times a day (TID) | INTRAMUSCULAR | Status: DC | PRN

## 2014-07-17 MED ORDER — OXYCODONE HCL 5 MG PO TABS
5.0000 mg | ORAL_TABLET | ORAL | Status: DC | PRN
  Administered 2014-07-17 – 2014-07-20 (×13): 10 mg via ORAL
  Filled 2014-07-17 (×12): qty 2

## 2014-07-17 MED ORDER — ACETAMINOPHEN 325 MG PO TABS
650.0000 mg | ORAL_TABLET | Freq: Four times a day (QID) | ORAL | Status: DC | PRN
  Administered 2014-07-17 – 2014-07-20 (×6): 650 mg via ORAL
  Filled 2014-07-17 (×6): qty 2

## 2014-07-17 MED ORDER — ASPIRIN EC 325 MG PO TBEC
325.0000 mg | DELAYED_RELEASE_TABLET | Freq: Every day | ORAL | Status: DC
  Administered 2014-07-18 – 2014-07-20 (×3): 325 mg via ORAL
  Filled 2014-07-17 (×4): qty 1

## 2014-07-17 MED ORDER — GABAPENTIN 300 MG PO CAPS
300.0000 mg | ORAL_CAPSULE | Freq: Three times a day (TID) | ORAL | Status: DC
  Administered 2014-07-17 – 2014-07-20 (×9): 300 mg via ORAL
  Filled 2014-07-17 (×11): qty 1

## 2014-07-17 MED ORDER — CEFUROXIME SODIUM 1.5 G IJ SOLR
INTRAMUSCULAR | Status: AC
  Filled 2014-07-17: qty 1.5

## 2014-07-17 MED ORDER — ARTIFICIAL TEARS OP OINT
TOPICAL_OINTMENT | OPHTHALMIC | Status: DC | PRN
  Administered 2014-07-17: 1 via OPHTHALMIC

## 2014-07-17 MED ORDER — ARTIFICIAL TEARS OP OINT
TOPICAL_OINTMENT | OPHTHALMIC | Status: AC
  Filled 2014-07-17: qty 3.5

## 2014-07-17 MED ORDER — FENTANYL CITRATE 0.05 MG/ML IJ SOLN
INTRAMUSCULAR | Status: DC | PRN
  Administered 2014-07-17 (×2): 50 ug via INTRAVENOUS
  Administered 2014-07-17: 100 ug via INTRAVENOUS
  Administered 2014-07-17: 50 ug via INTRAVENOUS

## 2014-07-17 MED ORDER — OXYCODONE HCL 5 MG/5ML PO SOLN: 5.0000 mg | Freq: Once | ORAL | Status: AC | PRN

## 2014-07-17 MED ORDER — INDAPAMIDE 1.25 MG PO TABS
1.2500 mg | ORAL_TABLET | Freq: Every day | ORAL | Status: DC
  Administered 2014-07-17 – 2014-07-20 (×3): 1.25 mg via ORAL
  Filled 2014-07-17 (×4): qty 1

## 2014-07-17 MED ORDER — SODIUM CHLORIDE 0.9 % IJ SOLN
INTRAMUSCULAR | Status: DC | PRN
  Administered 2014-07-17: 40 mL

## 2014-07-17 MED ORDER — SODIUM CHLORIDE 0.9 % IR SOLN
Status: DC | PRN
  Administered 2014-07-17: 1000 mL

## 2014-07-17 MED ORDER — CEFUROXIME SODIUM 1.5 G IJ SOLR
INTRAMUSCULAR | Status: DC | PRN
  Administered 2014-07-17: 1.5 g

## 2014-07-17 MED ORDER — ONDANSETRON HCL 4 MG/2ML IJ SOLN
INTRAMUSCULAR | Status: AC
  Filled 2014-07-17: qty 2

## 2014-07-17 MED ORDER — MENTHOL 3 MG MT LOZG: 1.0000 | LOZENGE | OROMUCOSAL | Status: DC | PRN

## 2014-07-17 MED ORDER — HYDROMORPHONE HCL PF 1 MG/ML IJ SOLN
0.2500 mg | INTRAMUSCULAR | Status: DC | PRN
  Administered 2014-07-17 (×4): 0.5 mg via INTRAVENOUS

## 2014-07-17 MED ORDER — PROPOFOL 10 MG/ML IV BOLUS
INTRAVENOUS | Status: DC | PRN
  Administered 2014-07-17: 50 mg via INTRAVENOUS
  Administered 2014-07-17: 200 mg via INTRAVENOUS

## 2014-07-17 MED ORDER — MIDAZOLAM HCL 2 MG/2ML IJ SOLN
INTRAMUSCULAR | Status: AC
  Filled 2014-07-17: qty 2

## 2014-07-17 MED ORDER — DEXTROSE-NACL 5-0.45 % IV SOLN: INTRAVENOUS | Status: DC

## 2014-07-17 MED ORDER — DOCUSATE SODIUM 100 MG PO CAPS
100.0000 mg | ORAL_CAPSULE | Freq: Two times a day (BID) | ORAL | Status: DC
  Administered 2014-07-17 – 2014-07-20 (×6): 100 mg via ORAL
  Filled 2014-07-17 (×8): qty 1

## 2014-07-17 MED ORDER — METHOCARBAMOL 1000 MG/10ML IJ SOLN
500.0000 mg | Freq: Four times a day (QID) | INTRAVENOUS | Status: DC | PRN
  Administered 2014-07-17: 500 mg via INTRAVENOUS
  Filled 2014-07-17 (×2): qty 5

## 2014-07-17 MED ORDER — ROPIVACAINE HCL 5 MG/ML IJ SOLN
INTRAMUSCULAR | Status: DC | PRN
  Administered 2014-07-17: 20 mL via PERINEURAL

## 2014-07-17 MED ORDER — LACTATED RINGERS IV SOLN
INTRAVENOUS | Status: DC | PRN
  Administered 2014-07-17 (×2): via INTRAVENOUS

## 2014-07-17 MED ORDER — TORSEMIDE 20 MG PO TABS
20.0000 mg | ORAL_TABLET | Freq: Every day | ORAL | Status: DC
  Administered 2014-07-17 – 2014-07-20 (×4): 20 mg via ORAL
  Filled 2014-07-17 (×4): qty 1

## 2014-07-17 MED ORDER — ACETAMINOPHEN 650 MG RE SUPP: 650.0000 mg | Freq: Four times a day (QID) | RECTAL | Status: DC | PRN

## 2014-07-17 MED ORDER — POTASSIUM CHLORIDE CRYS ER 10 MEQ PO TBCR
10.0000 meq | EXTENDED_RELEASE_TABLET | Freq: Every day | ORAL | Status: DC
  Administered 2014-07-17 – 2014-07-20 (×4): 10 meq via ORAL
  Filled 2014-07-17 (×4): qty 1

## 2014-07-17 MED ORDER — BISACODYL 5 MG PO TBEC
5.0000 mg | DELAYED_RELEASE_TABLET | Freq: Every day | ORAL | Status: DC | PRN
  Administered 2014-07-19: 5 mg via ORAL
  Filled 2014-07-17: qty 1

## 2014-07-17 MED ORDER — FENTANYL CITRATE 0.05 MG/ML IJ SOLN
INTRAMUSCULAR | Status: AC
  Filled 2014-07-17: qty 5

## 2014-07-17 MED ORDER — METOCLOPRAMIDE HCL 10 MG PO TABS: 5.0000 mg | ORAL_TABLET | Freq: Three times a day (TID) | ORAL | Status: DC | PRN

## 2014-07-17 MED ORDER — ONDANSETRON HCL 4 MG/2ML IJ SOLN
INTRAMUSCULAR | Status: DC | PRN
  Administered 2014-07-17: 4 mg via INTRAVENOUS

## 2014-07-17 MED ORDER — PROPOFOL 10 MG/ML IV BOLUS
INTRAVENOUS | Status: AC
  Filled 2014-07-17: qty 20

## 2014-07-17 MED ORDER — PHENYLEPHRINE HCL 10 MG/ML IJ SOLN
INTRAMUSCULAR | Status: DC | PRN
Start: 2014-07-17 — End: 2014-07-17
  Administered 2014-07-17: 80 ug via INTRAVENOUS

## 2014-07-17 MED ORDER — HYDROMORPHONE HCL PF 1 MG/ML IJ SOLN
0.5000 mg | INTRAMUSCULAR | Status: DC | PRN
  Administered 2014-07-17 – 2014-07-19 (×5): 1 mg via INTRAVENOUS
  Filled 2014-07-17 (×5): qty 1

## 2014-07-17 MED ORDER — LIDOCAINE HCL (CARDIAC) 20 MG/ML IV SOLN
INTRAVENOUS | Status: AC
  Filled 2014-07-17: qty 5

## 2014-07-17 MED ORDER — ONDANSETRON HCL 4 MG PO TABS: 4.0000 mg | ORAL_TABLET | Freq: Four times a day (QID) | ORAL | Status: DC | PRN

## 2014-07-17 MED ORDER — SIMVASTATIN 10 MG PO TABS
10.0000 mg | ORAL_TABLET | Freq: Every day | ORAL | Status: DC
  Administered 2014-07-17 – 2014-07-19 (×3): 10 mg via ORAL
  Filled 2014-07-17 (×4): qty 1

## 2014-07-17 MED ORDER — MEPERIDINE HCL 25 MG/ML IJ SOLN: 6.2500 mg | INTRAMUSCULAR | Status: DC | PRN

## 2014-07-17 MED ORDER — BUPIVACAINE-EPINEPHRINE (PF) 0.5% -1:200000 IJ SOLN
INTRAMUSCULAR | Status: DC | PRN
  Administered 2014-07-17: 30 mL via PERINEURAL

## 2014-07-17 MED ORDER — METHOCARBAMOL 500 MG PO TABS
500.0000 mg | ORAL_TABLET | Freq: Four times a day (QID) | ORAL | Status: DC | PRN
  Administered 2014-07-17 – 2014-07-20 (×7): 500 mg via ORAL
  Filled 2014-07-17 (×8): qty 1

## 2014-07-17 MED ORDER — PHENYLEPHRINE 40 MCG/ML (10ML) SYRINGE FOR IV PUSH (FOR BLOOD PRESSURE SUPPORT)
PREFILLED_SYRINGE | INTRAVENOUS | Status: AC
  Filled 2014-07-17: qty 10

## 2014-07-17 MED ORDER — HYDROMORPHONE HCL PF 1 MG/ML IJ SOLN
0.5000 mg | INTRAMUSCULAR | Status: AC | PRN
  Administered 2014-07-17 (×4): 0.5 mg via INTRAVENOUS

## 2014-07-17 MED ORDER — PHENOL 1.4 % MT LIQD: 1.0000 | OROMUCOSAL | Status: DC | PRN

## 2014-07-17 MED ORDER — DEXTROSE 5 % IV SOLN
INTRAVENOUS | Status: DC | PRN
  Administered 2014-07-17: 08:00:00 via INTRAVENOUS

## 2014-07-17 MED ORDER — ALUM & MAG HYDROXIDE-SIMETH 200-200-20 MG/5ML PO SUSP
30.0000 mL | ORAL | Status: DC | PRN
  Administered 2014-07-19 (×2): 30 mL via ORAL
  Filled 2014-07-17 (×2): qty 30

## 2014-07-17 SURGICAL SUPPLY — 63 items
BANDAGE ELASTIC 6 VELCRO ST LF (GAUZE/BANDAGES/DRESSINGS) ×2 IMPLANT
BANDAGE ESMARK 6X9 LF (GAUZE/BANDAGES/DRESSINGS) ×1 IMPLANT
BIT DRILL 2.5X110 QC LCP DISP (BIT) ×2 IMPLANT
BLADE SAG 18X100X1.27 (BLADE) ×2 IMPLANT
BLADE SAW SGTL 13X75X1.27 (BLADE) ×2 IMPLANT
BLADE SURG ROTATE 9660 (MISCELLANEOUS) IMPLANT
BNDG ELASTIC 6X10 VLCR STRL LF (GAUZE/BANDAGES/DRESSINGS) ×2 IMPLANT
BNDG ESMARK 6X9 LF (GAUZE/BANDAGES/DRESSINGS) ×2
BOWL SMART MIX CTS (DISPOSABLE) ×2 IMPLANT
CAPT RP KNEE ×2 IMPLANT
CEMENT HV SMART SET (Cement) ×4 IMPLANT
COVER SURGICAL LIGHT HANDLE (MISCELLANEOUS) ×2 IMPLANT
CUFF TOURNIQUET SINGLE 34IN LL (TOURNIQUET CUFF) ×2 IMPLANT
CUFF TOURNIQUET SINGLE 44IN (TOURNIQUET CUFF) IMPLANT
DRAPE EXTREMITY T 121X128X90 (DRAPE) ×2 IMPLANT
DRAPE U-SHAPE 47X51 STRL (DRAPES) ×2 IMPLANT
DRSG PAD ABDOMINAL 8X10 ST (GAUZE/BANDAGES/DRESSINGS) ×2 IMPLANT
DURAPREP 26ML APPLICATOR (WOUND CARE) ×4 IMPLANT
ELECT REM PT RETURN 9FT ADLT (ELECTROSURGICAL) ×2
ELECTRODE REM PT RTRN 9FT ADLT (ELECTROSURGICAL) ×1 IMPLANT
EVACUATOR 1/8 PVC DRAIN (DRAIN) ×2 IMPLANT
GAUZE SPONGE 4X4 12PLY STRL (GAUZE/BANDAGES/DRESSINGS) ×4 IMPLANT
GAUZE XEROFORM 1X8 LF (GAUZE/BANDAGES/DRESSINGS) ×2 IMPLANT
GLOVE BIO SURGEON STRL SZ7.5 (GLOVE) ×2 IMPLANT
GLOVE BIO SURGEON STRL SZ8.5 (GLOVE) ×2 IMPLANT
GLOVE BIOGEL PI IND STRL 8 (GLOVE) ×1 IMPLANT
GLOVE BIOGEL PI IND STRL 9 (GLOVE) ×1 IMPLANT
GLOVE BIOGEL PI INDICATOR 8 (GLOVE) ×1
GLOVE BIOGEL PI INDICATOR 9 (GLOVE) ×1
GOWN STRL REUS W/ TWL LRG LVL3 (GOWN DISPOSABLE) ×1 IMPLANT
GOWN STRL REUS W/ TWL XL LVL3 (GOWN DISPOSABLE) ×2 IMPLANT
GOWN STRL REUS W/TWL LRG LVL3 (GOWN DISPOSABLE) ×1
GOWN STRL REUS W/TWL XL LVL3 (GOWN DISPOSABLE) ×2
HANDPIECE INTERPULSE COAX TIP (DISPOSABLE) ×1
HOOD PEEL AWAY FACE SHEILD DIS (HOOD) ×4 IMPLANT
IMMOBILIZER KNEE 22 UNIV (SOFTGOODS) ×2 IMPLANT
KIT BASIN OR (CUSTOM PROCEDURE TRAY) ×2 IMPLANT
KIT ROOM TURNOVER OR (KITS) ×2 IMPLANT
MANIFOLD NEPTUNE II (INSTRUMENTS) ×2 IMPLANT
NDL SAFETY ECLIPSE 18X1.5 (NEEDLE) ×1 IMPLANT
NEEDLE 22X1 1/2 (OR ONLY) (NEEDLE) ×2 IMPLANT
NEEDLE HYPO 18GX1.5 SHARP (NEEDLE) ×1
NEEDLE SPNL 18GX3.5 QUINCKE PK (NEEDLE) ×2 IMPLANT
NS IRRIG 1000ML POUR BTL (IV SOLUTION) ×2 IMPLANT
PACK TOTAL JOINT (CUSTOM PROCEDURE TRAY) ×2 IMPLANT
PAD ARMBOARD 7.5X6 YLW CONV (MISCELLANEOUS) ×4 IMPLANT
PADDING CAST COTTON 6X4 STRL (CAST SUPPLIES) ×2 IMPLANT
SCREW CANC FT 4.0X45 (Screw) ×2 IMPLANT
SET HNDPC FAN SPRY TIP SCT (DISPOSABLE) ×1 IMPLANT
SPONGE GAUZE 4X4 12PLY STER LF (GAUZE/BANDAGES/DRESSINGS) ×2 IMPLANT
STAPLER VISISTAT 35W (STAPLE) ×2 IMPLANT
SUCTION FRAZIER TIP 10 FR DISP (SUCTIONS) ×2 IMPLANT
SUT VIC AB 0 CTX 36 (SUTURE) ×1
SUT VIC AB 0 CTX36XBRD ANTBCTR (SUTURE) ×1 IMPLANT
SUT VIC AB 1 CTX 36 (SUTURE) ×1
SUT VIC AB 1 CTX36XBRD ANBCTR (SUTURE) ×1 IMPLANT
SUT VIC AB 2-0 CT1 27 (SUTURE) ×1
SUT VIC AB 2-0 CT1 TAPERPNT 27 (SUTURE) ×1 IMPLANT
SYR 30ML LL (SYRINGE) ×2 IMPLANT
SYR 50ML LL SCALE MARK (SYRINGE) ×2 IMPLANT
TOWEL OR 17X24 6PK STRL BLUE (TOWEL DISPOSABLE) ×2 IMPLANT
TOWEL OR 17X26 10 PK STRL BLUE (TOWEL DISPOSABLE) ×2 IMPLANT
WATER STERILE IRR 1000ML POUR (IV SOLUTION) ×4 IMPLANT

## 2014-07-17 NOTE — Op Note (Signed)
PATIENT ID:      Lauren Santana  MRN:     [id number] DOB/AGE:    08-09-1951 / 63 y.o.       OPERATIVE REPORT    DATE OF PROCEDURE:  07/17/2014       PREOPERATIVE DIAGNOSIS:   LEFT KNEE OSTEOARTHRITIS      Estimated body mass index is 44.3 kg/(m^2) as calculated from the following:   Height as of this encounter: 5\' 3"  (1.6 m).   Weight as of this encounter: 113.399 kg (250 lb).                                                        POSTOPERATIVE DIAGNOSIS:   LEFT KNEE OSTEOARTHRITIS                                                                      PROCEDURE:  Procedure(s): TOTAL KNEE ARTHROPLASTY Using Depuy Sigma RP implants #3L Femur, #3Tibia, 42mm Sigma RP bearing, 35 Patella     SURGEON: Myrta Mercer J    ASSISTANT:   [employer] K[employer]   (Present and scrubbed throughout the case, critical for assistance with exposure, retraction, instrumentation, and closure.)         ANESTHESIA: GET, ACB, Exparel  DRAINS: 2 medium hemovac in knee   TOURNIQUET TIME: 78GNF   COMPLICATIONS:  None     SPECIMENS: None   INDICATIONS FOR PROCEDURE: The patient has  LEFT KNEE OSTEOARTHRITIS, varus deformities, XR shows bone on bone arthritis. Patient has failed all conservative measures including anti-inflammatory medicines, narcotics, attempts at  exercise and weight loss, cortisone injections and viscosupplementation.  Risks and benefits of surgery have been discussed, questions answered.   DESCRIPTION OF PROCEDURE: The patient identified by armband, received  IV antibiotics, in the holding area at [facility] Patient taken to the operating room, appropriate anesthetic  monitors were attached, and general endotracheal anesthesia induced with  the patient in supine position, Foley catheter was inserted. Tourniquet  applied high to the operative thigh. Lateral post and foot positioner  applied to the table, the lower extremity was then prepped and draped  in usual sterile fashion  from the ankle to the tourniquet. Time-out procedure was performed. The limb was wrapped with an Esmarch bandage and the tourniquet inflated to 350 mmHg. We began the operation by making the anterior midline incision starting at handbreadth above the patella going over the patella 1 cm medial to and  4 cm distal to the tibial tubercle. Small bleeders in the skin and the  subcutaneous tissue identified and cauterized. Transverse retinaculum was incised and reflected medially and a medial parapatellar arthrotomy was accomplished. the patella was everted and theprepatellar fat pad resected. The superficial medial collateral  ligament was then elevated from anterior to posterior along the proximal  flare of the tibia and anterior half of the menisci resected. The knee was hyperflexed exposing bone on bone arthritis. Peripheral and notch osteophytes as well as the cruciate ligaments were then resected. We continued to  work our way around posteriorly  along the proximal tibia, and externally  rotated the tibia subluxing it out from underneath the femur. A McHale  retractor was placed through the notch and a lateral Hohmann retractor  placed, and we then drilled through the proximal tibia in line with the  axis of the tibia followed by an intramedullary guide rod and 2-degree  posterior slope cutting guide. The tibial cutting guide was pinned into place  allowing resection of 7 mm of bone medially and about 13 mm of bone  laterally because of her varus deformity. Satisfied with the tibial resection, we then  entered the distal femur 2 mm anterior to the PCL origin with the  intramedullary guide rod and applied the distal femoral cutting guide  set at 38mm, with 5 degrees of valgus. This was pinned along the  epicondylar axis. At this point, the distal femoral cut was accomplished without difficulty. We then sized for a #3L femoral component and pinned the guide in 3 degrees of external rotation.The chamfer  cutting guide was pinned into place. The anterior, posterior, and chamfer cuts were accomplished without difficulty followed by  the box cutting guide and the box cut. We also removed posterior osteophytes from the posterior femoral condyles. At this  time, the knee was brought into full extension. We checked our  extension and flexion gaps and found them symmetric at 57mm.  The patella thickness measured at 24 mm. We set the cutting guide at 15 and removed the posterior 9 mm  of the patella, sized for a 35 button and drilled the lollipop. The knee  was then once again hyperflexed exposing the proximal tibia. We sized for a #3 tibial base plate, applied the smokestack and the conical reamer followed by the the Delta fin keel punch. A small anteromedial cortical disruption in the rim of the tibia was noted and although was stable we went ahead and placed a 3.5 mm x 45 mm Synthes cancellus screw from medial to lateral across the anterior tibia to ensure that the cortical disruption did not gap. We then hammered into place the Sigma RP trial femoral component, and a drilled the lug holes, inserted a 10-mm trial bearing, trial patellar button, and took the knee through range of motion from 0-130 degrees. No thumb pressure was required for patellar  tracking. At this point, all trial components were removed, a double batch of DePuy HV cement with 1500 mg of Zinacef was mixed and applied to all bony metallic mating surfaces except for the posterior condyles of the femur itself. In order, we  hammered into place the tibial tray and removed excess cement, the femoral component and removed excess cement, a 10-mm Sigma RP bearing  was inserted, and the knee brought to full extension with compression.  The patellar button was clamped into place, and excess cement  removed. While the cement cured the wound was irrigated out with normal saline solution pulse lavage, and medium Hemovac drains were placed from an  anterolateral  approach. Ligament stability and patellar tracking were checked and found to be excellent. The parapatellar arthrotomy was closed with  running #1 Vicryl suture. The subcutaneous tissue with 0 and 2-0 undyed  Vicryl suture, and the skin with skin staples. A dressing of Xeroform,  4 x 4, dressing sponges, Webril, and Ace wrap applied. The patient  awakened, extubated, and taken to recovery room without difficulty.   Samayra Hebel J 07/17/2014, 9:23 AM

## 2014-07-17 NOTE — Anesthesia Postprocedure Evaluation (Signed)
Anesthesia Post Note  Patient: Lauren Santana  Procedure(s) Performed: Procedure(s) (LRB): TOTAL KNEE ARTHROPLASTY (Left)  Anesthesia type: General  Patient location: PACU  Post pain: Pain level controlled  Post assessment: Post-op Vital signs reviewed  Last Vitals: BP 136/71  Pulse 76  Temp(Src) 36 C (Oral)  Resp 12  Ht 5\' 3"  (1.6 m)  Wt 250 lb (113.399 kg)  BMI 44.30 kg/m2  SpO2 100%  Post vital signs: Reviewed  Level of consciousness: sedated  Complications: Block failure. Repeated adductor canal block in PACU. Pain control much improved. No other apparent anesthesia complications.

## 2014-07-17 NOTE — Interval H&P Note (Signed)
History and Physical Interval Note:  07/17/2014 7:16 AM  Lauren Santana  has presented today for surgery, with the diagnosis of LEFT KNEE OSTEOARTHRITIS  The various methods of treatment have been discussed with the patient and family. After consideration of risks, benefits and other options for treatment, the patient has consented to  Procedure(s): TOTAL KNEE ARTHROPLASTY (Left) as a surgical intervention .  The patient's history has been reviewed, patient examined, no change in status, stable for surgery.  I have reviewed the patient's chart and labs.  Questions were answered to the patient's satisfaction.     Kerin Salen

## 2014-07-17 NOTE — Anesthesia Preprocedure Evaluation (Addendum)
Anesthesia Evaluation  Patient identified by MRN, date of birth, ID band Patient awake    Reviewed: Allergy & Precautions, H&P , NPO status , Patient's Chart, lab work & pertinent test results, reviewed documented beta blocker date and time   Airway Mallampati: I TM Distance: >3 FB Neck ROM: Full    Dental  (+) Edentulous Upper, Edentulous Lower, Dental Advisory Given   Pulmonary          Cardiovascular hypertension, Pt. on home beta blockers     Neuro/Psych    GI/Hepatic   Endo/Other  Morbid obesity  Renal/GU      Musculoskeletal   Abdominal   Peds  Hematology   Anesthesia Other Findings   Reproductive/Obstetrics                          Anesthesia Physical Anesthesia Plan  ASA: II  Anesthesia Plan: General   Post-op Pain Management:    Induction: Intravenous  Airway Management Planned: Oral ETT  Additional Equipment:   Intra-op Plan:   Post-operative Plan: Extubation in OR  Informed Consent: I have reviewed the patients History and Physical, chart, labs and discussed the procedure including the risks, benefits and alternatives for the proposed anesthesia with the patient or authorized representative who has indicated his/her understanding and acceptance.     Plan Discussed with: CRNA and Surgeon  Anesthesia Plan Comments:         Anesthesia Quick Evaluation

## 2014-07-17 NOTE — Evaluation (Signed)
Physical Therapy Evaluation Patient Details Name: Lauren Santana MRN: [id number] DOB: 04/18/1951 Today's Date: 07/17/2014   History of Present Illness  Patient is a 63 y/o female s/p L TKA. PMH positive for HTN, arthritis and morbid obesity. WBAT LLE.  Clinical Impression  Patient presents with functional limitations due to deficits listed in PT problem list (see below). Mobility assessment limited due to drowsiness and lethargy during evaluation. Pt given pain medication prior to treatment. Nodding off at times during evaluation. Pt reports living home alone and will not have adequate support at d/c. Pt would benefit from acute PT and follow up ST SNF to improve safe mobility, transfers and gait so pt can maximize independence and return to PLOF.     Follow Up Recommendations SNF;Supervision/Assistance - 24 hour    Equipment Recommendations   (defer to SNF)    Recommendations for Other Services       Precautions / Restrictions Precautions Precautions: Knee;Fall Precaution Booklet Issued: Yes (comment) Precaution Comments: issued HEP handout. Reviewed precautions. Restrictions Weight Bearing Restrictions: Yes LLE Weight Bearing: Weight bearing as tolerated      Mobility  Bed Mobility Overal bed mobility: Needs Assistance Bed Mobility: Supine to Sit;Sit to Supine     Supine to sit: Mod assist;HOB elevated Sit to supine: Min assist;HOB elevated   General bed mobility comments: required assist with LLE, getting trunk upright and scooting bottom to EOB. Assist with bringing LLE into bed. Able to reposition self with bed in trendelenberg.  Transfers Overall transfer level: Needs assistance Equipment used: Rolling walker (2 wheeled) Transfers: Sit to/from Stand Sit to Stand: Min assist         General transfer comment: Able to stand from EOB x1 with VC for hand placement and technique. Increased time to stand as pt drowsey and falling asleep sitting EOB. VC for upright  posture and Min A for hip extension.  Ambulation/Gait Ambulation/Gait assistance: Min assist Ambulation Distance (Feet): 3 Feet Assistive device: Rolling walker (2 wheeled) Gait Pattern/deviations: Step-to pattern;Decreased stance time - left;Decreased weight shift to left;Wide base of support;Trunk flexed     General Gait Details: Demonstrated decreased weightbearing through LLE secondary to pain. Able to take a few steps, Min A with RW management, most likely due to drowsiness. Ambulation distance limited due to drowsiness.  Stairs            Wheelchair Mobility    Modified Rankin (Stroke Patients Only)       Balance Overall balance assessment: Needs assistance   Sitting balance-Leahy Scale: Fair Sitting balance - Comments: Able to sit EOB with support on RW as well as without support, however supervision required due to drowsiness.   Standing balance support: During functional activity;Bilateral upper extremity supported Standing balance-Leahy Scale: Poor Standing balance comment: Use of UEs for support upon standing and during gait due to post surgical deficits of LLE.                             Pertinent Vitals/Pain Pain Assessment: 0-10 Pain Score: 8  Pain Descriptors / Indicators: Sore;Aching Pain Intervention(s): Monitored during session;Premedicated before session;Repositioned    Home Living Family/patient expects to be discharged to:: Skilled nursing facility                      Prior Function Level of Independence: Independent with assistive device(s)  Hand Dominance        Extremity/Trunk Assessment               Lower Extremity Assessment: RLE deficits/detail;LLE deficits/detail;Generalized weakness   LLE Deficits / Details: Limited AROM hip flexion and knee flexion/extension secondary to pain. Ankle AROm WFL. Able to stand and take a few steps demonstrating functional strength.     Communication    Communication: No difficulties  Cognition Arousal/Alertness: Lethargic;Suspect due to medications Behavior During Therapy: [facility] for tasks assessed/performed Overall Cognitive Status: Within Functional Limits for tasks assessed                      General Comments General comments (skin integrity, edema, etc.): Hemovac intact. 02 removed for evaluation and Sa02 dropped to 85% on RA with exercise. Donned 02 post session and Sa02 returned to 92%.    Exercises Total Joint Exercises Ankle Circles/Pumps: Both;10 reps;Seated Quad Sets: Both;5 reps;Left;Supine Gluteal Sets: Both;5 reps;Supine      Assessment/Plan    PT Assessment Patient needs continued PT services  PT Diagnosis Generalized weakness;Acute pain;Difficulty walking   PT Problem List Decreased strength;Cardiopulmonary status limiting activity;Pain;Decreased range of motion;Impaired sensation;Decreased activity tolerance;Decreased knowledge of use of DME;Obesity;Decreased safety awareness;Decreased balance;Decreased mobility;Decreased knowledge of precautions;Decreased skin integrity  PT Treatment Interventions DME instruction;Balance training;Gait training;Patient/family education;Functional mobility training;Therapeutic activities;Therapeutic exercise   PT Goals (Current goals can be found in the Care Plan section) Acute Rehab PT Goals PT Goal Formulation: Patient unable to participate in goal setting (secondary to drowsiness/lethargy.)    Frequency 7X/week   Barriers to discharge Decreased caregiver support      Co-evaluation               End of Session Equipment Utilized During Treatment: Gait belt Activity Tolerance: Patient limited by lethargy Patient left: in bed;with call bell/phone within reach;with bed alarm set Nurse Communication: Mobility status;Precautions         Time: 4259-5638 PT Time Calculation (min): 25 min   Charges:   PT Evaluation $Initial PT Evaluation Tier I: 1  Procedure PT Treatments $Therapeutic Activity: 8-22 mins   PT G CodesCandy Sledge A 07/17/2014, 5:10 PM Candy Sledge, [place], DPT [phone]

## 2014-07-17 NOTE — Anesthesia Procedure Notes (Addendum)
Procedure Name: Intubation Date/Time: 07/17/2014 7:46 AM Performed by: Terrill Mohr Pre-anesthesia Checklist: Patient identified, Emergency Drugs available, Suction available and Patient being monitored Patient Re-evaluated:Patient Re-evaluated prior to inductionOxygen Delivery Method: Circle system utilized Preoxygenation: Pre-oxygenation with 100% oxygen Intubation Type: IV induction Ventilation: Mask ventilation without difficulty Laryngoscope Size: Mac and 4 Grade View: Grade II Tube type: Oral Tube size: 7.5 mm Number of attempts: 1 (Dr. Conrad Osborne did intubation while CRNA prepared ETT) Airway Equipment and Method: Stylet Placement Confirmation: ETT inserted through vocal cords under direct vision,  breath sounds checked- equal and bilateral and positive ETCO2 Secured at: 21 (cm at gums) cm Tube secured with: Tape Dental Injury: Teeth and Oropharynx as per pre-operative assessment  Comments: Switched to oral ETT after first placing a #4 LMA.  Pt got light and could no longer be ventilated via LMA>    Anesthesia Regional Block:  Adductor canal block  Pre-Anesthetic Checklist: ,, timeout performed, Correct Patient, Correct Site, Correct Laterality, Correct Procedure, Correct Position, site marked, Risks and benefits discussed,  Surgical consent,  Pre-op evaluation,  At surgeon's request and post-op pain management  Laterality: Left  Prep: chloraprep       Needles:  Injection technique: Single-shot  Needle Type: Echogenic Stimulator Needle     Needle Length: 9cm 9 cm Needle Gauge: 21 and 21 G    Additional Needles:  Procedures: ultrasound guided (picture in chart) Adductor canal block Narrative:  Start time: 07/17/2014 7:10 AM End time: 07/17/2014 7:20 AM Injection made incrementally with aspirations every 5 mL.  Performed by: Personally  Anesthesiologist: Lillia Abed MD  Additional Notes: Monitors applied. Patient sedated. Sterile prep and drape,hand hygiene and  sterile gloves were used. Relevant anatomy identified.Needle position confirmed.Local anesthetic injected incrementally after negative aspiration. Local anesthetic spread visualized around nerve(s). Vascular puncture avoided. No complications. Image printed for medical record.The patient tolerated the procedure well.    Lillia Abed MD

## 2014-07-17 NOTE — Progress Notes (Signed)
Orthopedic Tech Progress Note Patient Details:  Lauren Santana [facility] 10/19/1951 [id number] CPM applied to LLE with appropriate settings. OHf applied to bed.  CPM Left Knee CPM Left Knee: On Left Knee Flexion (Degrees): 40 Left Knee Extension (Degrees): 10   Asia R Thompson 07/17/2014, 11:34 AM

## 2014-07-17 NOTE — Transfer of Care (Signed)
Immediate Anesthesia Transfer of Care Note  Patient: Lauren Santana [facility]  Procedure(s) Performed: Procedure(s): TOTAL KNEE ARTHROPLASTY (Left)  Patient Location: PACU  Anesthesia Type:General  Level of Consciousness: awake and patient cooperative  Airway & Oxygen Therapy: Patient Spontanous Breathing and Patient connected to nasal cannula oxygen  Post-op Assessment: Report given to PACU RN, Post -op Vital signs reviewed and stable and Patient moving all extremities  Post vital signs: Reviewed and stable  Complications: No apparent anesthesia complications

## 2014-07-18 ENCOUNTER — Encounter (HOSPITAL_COMMUNITY): Payer: Self-pay | Admitting: Orthopedic Surgery

## 2014-07-18 LAB — BASIC METABOLIC PANEL
Anion gap: 12 (ref 5–15)
BUN: 15 mg/dL (ref 6–23)
CO2: 26 mEq/L (ref 19–32)
Calcium: 8.4 mg/dL (ref 8.4–10.5)
Chloride: 97 mEq/L (ref 96–112)
Creatinine, Ser: 0.9 mg/dL (ref 0.50–1.10)
GFR calc Af Amer: 77 mL/min — ABNORMAL LOW (ref >90)
GFR calc non Af Amer: 66 mL/min — ABNORMAL LOW (ref >90)
Glucose, Bld: 128 mg/dL — ABNORMAL HIGH (ref 70–99)
Potassium: 3.7 mEq/L (ref 3.7–5.3)
Sodium: 135 mEq/L — ABNORMAL LOW (ref 137–147)

## 2014-07-18 LAB — CBC
HCT: 31.6 % — ABNORMAL LOW (ref 36.0–46.0)
Hemoglobin: 10.3 g/dL — ABNORMAL LOW (ref 12.0–15.0)
MCH: 32.3 pg (ref 26.0–34.0)
MCHC: 32.6 g/dL (ref 30.0–36.0)
MCV: 99.1 fL (ref 78.0–100.0)
Platelets: 112 10*3/uL — ABNORMAL LOW (ref 150–400)
RBC: 3.19 MIL/uL — ABNORMAL LOW (ref 3.87–5.11)
RDW: 13.6 % (ref 11.5–15.5)
WBC: 7.7 10*3/uL (ref 4.0–10.5)

## 2014-07-18 MED ORDER — METHOCARBAMOL 500 MG PO TABS: ORAL_TABLET | ORAL | Status: DC

## 2014-07-18 MED ORDER — OXYCODONE-ACETAMINOPHEN 5-325 MG PO TABS: 1.0000 | ORAL_TABLET | ORAL | Status: DC | PRN

## 2014-07-18 MED ORDER — ASPIRIN EC 325 MG PO TBEC: 325.0000 mg | DELAYED_RELEASE_TABLET | Freq: Two times a day (BID) | ORAL | Status: DC

## 2014-07-18 NOTE — Progress Notes (Signed)
Patient ID: Lauren Santana, female   DOB: [birth year], 63 y.o.   MRN: [id number] PATIENT ID: Lauren Santana  MRN: [id number]  DOB/AGE:  08-21-1951 / 63 y.o.  63 y.o.  1 Day Post-Op Procedure(s) (LRB): TOTAL KNEE ARTHROPLASTY (Left)    PROGRESS NOTE Subjective: Patient is alert, oriented, no Nausea, no Vomiting, yes passing gas, no Bowel Movement. Taking PO well. Denies SOB, Chest or Calf Pain. Using Incentive Spirometer, PAS in place. Ambulate WBAT, CPM 0-40 Patient reports pain as 4 on 0-10 scale  .    Objective: Vital signs in last 24 hours: Filed Vitals:   07/17/14 1615 07/17/14 2117 07/18/14 0119 07/18/14 0718  BP: 138/76 136/53 135/54 101/41  Pulse: 78 95 101 95  Temp: 98.1 F (36.7 C) 100.2 F (37.9 C) 100.8 F (38.2 C) 100.6 F (38.1 C)  TempSrc:  Oral Oral Oral  Resp: 14 16 16 16   Height:      Weight:      SpO2: 100% 95% 100% 100%      Intake/Output from previous day: I/O last 3 completed shifts: In: 2160 [P.O.:360; I.V.:1750; IV Piggyback:50] Out: 200 [Urine:150; Blood:50]   Intake/Output this shift:     LABORATORY DATA:  Recent Labs  07/18/14 0550  WBC 7.7  HGB 10.3*  HCT 31.6*  PLT PENDING  NA 135*  K 3.7  CL 97  CO2 26  BUN 15  CREATININE 0.90  GLUCOSE 128*  CALCIUM 8.4    Examination: Neurologically intact ABD soft Neurovascular intact Sensation intact distally Intact pulses distally Dorsiflexion/Plantar flexion intact Incision: dressing C/D/I No cellulitis present Compartment soft} Blood and plasma separated in drain indicating minimal recent drainage, drain pulled without difficulty.  Assessment:   63 y.o.  1 Day Post-Op Procedure(s) (LRB): TOTAL KNEE ARTHROPLASTY (Left) ADDITIONAL DIAGNOSIS: Expected Acute Blood Loss Anemia, Hypertension  Plan: PT/OT WBAT, CPM 5/hrs day until ROM 0-90 degrees, then D/C CPM DVT Prophylaxis:  SCDx72hrs, ASA 325 mg BID x 2 weeks DISCHARGE PLAN: Skilled Nursing Facility/Rehab, [name], need  FL2 DISCHARGE NEEDS: HHPT, HHRN, CPM, Walker and 3-in-1 comode seat     Lauren Santana J 07/18/2014, 7:39 AM

## 2014-07-18 NOTE — Progress Notes (Signed)
Physical Therapy Treatment Patient Details Name: Lauren Santana MRN: [id number] DOB: 03-21-1951 Today's Date: 07/18/2014    History of Present Illness Patient is a 63 y/o female s/p L TKA. PMH positive for HTN, arthritis and morbid obesity. WBAT LLE.    PT Comments    Patient very self limiting and requires increased time for all mobility. Patient stated " I have to talk myself into it". Requires lots of verbal and tactile cues. Continue to recommend SNF at this time.   Follow Up Recommendations  SNF;Supervision/Assistance - 24 hour     Equipment Recommendations       Recommendations for Other Services       Precautions / Restrictions Precautions Precautions: Knee;Fall Precaution Comments: reviewed precautions Restrictions Weight Bearing Restrictions: Yes LLE Weight Bearing: Weight bearing as tolerated    Mobility  Bed Mobility               General bed mobility comments: Patient in recliner before and after session  Transfers Overall transfer level: Needs assistance Equipment used: Rolling walker (2 wheeled) Transfers: Sit to/from Stand Sit to Stand: +2 physical assistance;Mod assist;Min assist         General transfer comment: +2 Mod A from recliner and +2 min from [facility]. A to power up and to stabilize into standing positioning. Cues for hand placement and posture.   Ambulation/Gait Ambulation/Gait assistance: Min assist Ambulation Distance (Feet): 2 Feet Assistive device: Rolling walker (2 wheeled) Gait Pattern/deviations: Step-to pattern;Trunk flexed   Gait velocity interpretation: <1.8 ft/sec, indicative of risk for recurrent falls General Gait Details: Patient with increased flexion and walker too far in front of her. Patient unable to pick up LLE well and required A. Patient self limiting with ambulation and returned to sitting   Stairs            Wheelchair Mobility    Modified Rankin (Stroke Patients Only)       Balance Overall  balance assessment: Needs assistance Sitting-balance support: No upper extremity supported;Feet supported Sitting balance-Leahy Scale: Fair     Standing balance support: Bilateral upper extremity supported;During functional activity Standing balance-Leahy Scale: Poor Standing balance comment: relies rw to maintain balance                    Cognition Arousal/Alertness: Awake/alert Behavior During Therapy: Anxious Overall Cognitive Status: Within Functional Limits for tasks assessed                      Exercises Total Joint Exercises Quad Sets: 5 reps;Left;10 reps Heel Slides: AAROM;Left;10 reps (very limited knee flexion)    General Comments        Pertinent Vitals/Pain Pain Assessment: 0-10 Pain Score: 5  Pain Location: L knee Pain Descriptors / Indicators: Sore;Aching Pain Intervention(s): Limited activity within patient's tolerance;Monitored during session    Home Living Family/patient expects to be discharged to:: Skilled nursing facility               Additional Comments: pt lives alone, decreased help at d/c    Prior Function Level of Independence: Independent with assistive device(s)      Comments: pt reports she "used a walker to walk to the mailbox"   PT Goals (current goals can now be found in the care plan section) Acute Rehab PT Goals Patient Stated Goal: not stated Progress towards PT goals: Progressing toward goals (slowly)    Frequency  7X/week    PT Plan Current plan remains  appropriate    Co-evaluation             End of Session Equipment Utilized During Treatment: Gait belt Activity Tolerance: Patient limited by fatigue Patient left: in chair;with call bell/phone within reach;with family/visitor present     Time: 4709-6283 PT Time Calculation (min): 29 min  Charges:  $Gait Training: 8-22 mins $Therapeutic Exercise: 8-22 mins                    G Codes:      Jacqualyn Posey 07/18/2014, 11:54  AM 07/18/2014 Jacqualyn Posey PTA [phone] pager [phone] office

## 2014-07-18 NOTE — Evaluation (Signed)
Occupational Therapy Evaluation Patient Details Name: Lauren Santana MRN: [id number] DOB: 11-22-1951 Today's Date: 07/18/2014    History of Present Illness Patient is a 63 y/o female s/p L TKA. PMH positive for HTN, arthritis and morbid obesity. WBAT LLE.   Clinical Impression   Pt admitted with the above diagnoses and presents with below problem list. Pt will benefit from continued acute OT to address the below listed deficits and maximize independence with basic ADLs prior to d/c to next venue. PTA pt was mod I with ADLs, using a walker as needed. From regular height surface, pt currently needing +2 min phys A to power up for LB ADLs and transfers performed in sit>stand; +1 mod A from elevated surface. Recommend SNF due to decreased caregiver support.    Follow Up Recommendations  SNF    Equipment Recommendations  Other (comment) (defer to next venue)    Recommendations for Other Services       Precautions / Restrictions Precautions Precautions: Knee;Fall Precaution Comments: reviewed precautions Restrictions Weight Bearing Restrictions: Yes LLE Weight Bearing: Weight bearing as tolerated      Mobility Bed Mobility               General bed mobility comments: Pt EOB upon arrival  Transfers Overall transfer level: Needs assistance Equipment used: Rolling walker (2 wheeled) Transfers: Sit to/from Stand Sit to Stand: +2 physical assistance;Min assist         General transfer comment: +2 min phy A from regular height surface, +1 mod A from elevated surface. Assist to powerup. Cues for technique needed.     Balance Overall balance assessment: Needs assistance Sitting-balance support: No upper extremity supported;Feet supported Sitting balance-Leahy Scale: Fair     Standing balance support: Bilateral upper extremity supported;During functional activity Standing balance-Leahy Scale: Poor Standing balance comment: relies rw to maintain balance                             ADL Overall ADL's : Needs assistance/impaired Eating/Feeding: Set up;Sitting   Grooming: Set up;Sitting   Upper Body Bathing: Set up;Sitting   Lower Body Bathing: +2 for physical assistance;Minimal assistance;Sit to/from stand   Upper Body Dressing : Set up;Sitting   Lower Body Dressing: Sit to/from stand;With adaptive equipment;Minimal assistance;+2 for physical assistance Lower Body Dressing Details (indicate cue type and reason): assist to stand Toilet Transfer: Minimal assistance;Ambulation;BSC;RW   Toileting- Clothing Manipulation and Hygiene: +2 for physical assistance;Minimal assistance;Sit to/from stand   Tub/ Shower Transfer: Minimal assistance;Ambulation;3 in 1;Rolling walker   Functional mobility during ADLs: Minimal assistance;Rolling walker;Cueing for safety General ADL Comments: Pt needed +2 assist to power up in sit>stand from regular height surface impacting level of A with ADLs. Educated pt on techniques for transfers and functional mobility. Pt needed mod A to complete perianal care in sit>stand.     Vision                     Perception     Praxis      Pertinent Vitals/Pain Pain Assessment: 0-10 Pain Score: 5  Pain Location: L knee Pain Descriptors / Indicators: Sore;Aching Pain Intervention(s): Limited activity within patient's tolerance;Monitored during session;Repositioned     Hand Dominance     Extremity/Trunk Assessment Upper Extremity Assessment Upper Extremity Assessment: Generalized weakness   Lower Extremity Assessment Lower Extremity Assessment: Defer to PT evaluation       Communication Communication Communication: No difficulties  Cognition Arousal/Alertness: Lethargic;Suspect due to medications Behavior During Therapy: [facility] for tasks assessed/performed Overall Cognitive Status: Within Functional Limits for tasks assessed                     General Comments       Exercises        Shoulder Instructions      Home Living Family/patient expects to be discharged to:: Skilled nursing facility                                 Additional Comments: pt lives alone, decreased help at d/c      Prior Functioning/Environment Level of Independence: Independent with assistive device(s)        Comments: pt reports she "used a walker to walk to the mailbox"    OT Diagnosis: Generalized weakness;Acute pain   OT Problem List: Impaired balance (sitting and/or standing);Decreased activity tolerance;Decreased safety awareness;Decreased knowledge of use of DME or AE;Decreased knowledge of precautions;Obesity;Pain   OT Treatment/Interventions: Self-care/ADL training;Therapeutic exercise;Energy conservation;DME and/or AE instruction;Therapeutic activities;Patient/family education;Balance training    OT Goals(Current goals can be found in the care plan section) Acute Rehab OT Goals Patient Stated Goal: not stated OT Goal Formulation: With patient Time For Goal Achievement: 07/25/14 Potential to Achieve Goals: Good ADL Goals Pt Will Perform Lower Body Bathing: with min guard assist;with adaptive equipment;sit to/from stand Pt Will Perform Lower Body Dressing: with min guard assist;with adaptive equipment;sit to/from stand Pt Will Transfer to Toilet: with min guard assist;ambulating (3n1 over toilet) Pt Will Perform Toileting - Clothing Manipulation and hygiene: with min guard assist;with adaptive equipment;sit to/from stand Pt Will Perform Tub/Shower Transfer: with min guard assist;ambulating;3 in 1;rolling walker Additional ADL Goal #1: Pt will complete supine>EOB with min guard A  using AE as needed to prepare for OOB ADLs.  OT Frequency: Min 3X/week   Barriers to D/C: Decreased caregiver support  pt lives alone       Co-evaluation              End of Session Equipment Utilized During Treatment: Gait belt;Rolling walker  Activity Tolerance: Patient  limited by fatigue Patient left: in chair;with call bell/phone within reach   Time: 1610-9604 OT Time Calculation (min): 19 min Charges:  OT General Charges $OT Visit: 1 Procedure OT Evaluation $Initial OT Evaluation Tier I: 1 Procedure OT Treatments $Self Care/Home Management : 8-22 mins G-Codes:    Hortencia Pilar Jul 21, 2014, 10:18 AM

## 2014-07-18 NOTE — Progress Notes (Signed)
Clinical Social Work Department CLINICAL SOCIAL WORK PLACEMENT NOTE 07/18/2014  Patient:  Lauren Santana, Lauren Santana  Account Number:  [id number] Admit date:  07/17/2014  Clinical Social Worker:  Creta Levin, LCSW  Date/time:  07/18/2014 09:37 PM  Clinical Social Work is seeking post-discharge placement for this patient at the following level of care:   SKILLED NURSING   (*CSW will update this form in Epic as items are completed)   07/18/2014  Patient/family provided with [facility] [place] Department of Clinical Social Work's list of facilities offering this level of care within the geographic area requested by the patient (or if unable, by the patient's family).  07/18/2014  Patient/family informed of their freedom to choose among providers that offer the needed level of care, that participate in Medicare, Medicaid or managed care program needed by the patient, have an available bed and are willing to accept the patient.  07/18/2014  Patient/family informed of [facility]' ownership interest in [facility], as well as of the fact that they are under no obligation to receive care at this facility.  PASARR submitted to EDS on 07/18/2014 PASARR number received on 07/18/2014  FL2 transmitted to all facilities in geographic area requested by pt/family on  07/18/2014 FL2 transmitted to all facilities within larger geographic area on   Patient informed that his/her managed care company has contracts with or will negotiate with  certain facilities, including the following:     Patient/family informed of bed offers received:   Patient chooses bed at  Physician recommends and patient chooses bed at    Patient to be transferred to  on   Patient to be transferred to facility by  Patient and family notified of transfer on  Name of family member notified:    The following physician request were entered in Epic:   Additional Comments:

## 2014-07-18 NOTE — Care Management Note (Signed)
CARE MANAGEMENT NOTE 07/18/2014  Patient:  FRANCEEN, ERISMAN   Account Number:  [id number]  Date Initiated:  07/18/2014  Documentation initiated by:  Ricki Miller  Subjective/Objective Assessment:   63 yr old female s/p left total knee arthroplasty.     Action/Plan:   Patient will need shortterm rehab at [facility]. Plans are for discharge to [facility]. Social Worker is aware.   Anticipated DC Date:  07/19/2014   Anticipated DC Plan:  SKILLED NURSING FACILITY  In-house referral  Clinical Social Worker      DC Planning Services  CM consult      [facility] Choice  NA   Choice offered to / List presented to:     DME arranged  NA        [place] arranged  NA      Status of service:  Completed, signed off Medicare Important Message given?  NA - LOS <3 / Initial given by admissions (If response is "NO", the following Medicare IM given date fields will be blank) Date Medicare IM given:   Medicare IM given by:   Date Additional Medicare IM given:   Additional Medicare IM given by:    Discharge Disposition:  [place]  Per UR Regulation:  Reviewed for med. necessity/level of care/duration of stay

## 2014-07-19 ENCOUNTER — Inpatient Hospital Stay (HOSPITAL_COMMUNITY): Payer: Medicare HMO

## 2014-07-19 LAB — URINALYSIS, ROUTINE W REFLEX MICROSCOPIC
Bilirubin Urine: NEGATIVE
Glucose, UA: NEGATIVE mg/dL
Ketones, ur: NEGATIVE mg/dL
Nitrite: NEGATIVE
Protein, ur: NEGATIVE mg/dL
Specific Gravity, Urine: 1.003 — ABNORMAL LOW (ref 1.005–1.030)
Urobilinogen, UA: 1 mg/dL (ref 0.0–1.0)
pH: 7 (ref 5.0–8.0)

## 2014-07-19 LAB — CBC
HCT: 32.5 % — ABNORMAL LOW (ref 36.0–46.0)
Hemoglobin: 10.6 g/dL — ABNORMAL LOW (ref 12.0–15.0)
MCH: 31.6 pg (ref 26.0–34.0)
MCHC: 32.6 g/dL (ref 30.0–36.0)
MCV: 97 fL (ref 78.0–100.0)
Platelets: 99 10*3/uL — ABNORMAL LOW (ref 150–400)
RBC: 3.35 MIL/uL — ABNORMAL LOW (ref 3.87–5.11)
RDW: 13.9 % (ref 11.5–15.5)
WBC: 8.9 10*3/uL (ref 4.0–10.5)

## 2014-07-19 LAB — URINE MICROSCOPIC-ADD ON

## 2014-07-19 NOTE — Progress Notes (Signed)
Physical Therapy Treatment Patient Details Name: Lauren Santana MRN: [id number] DOB: 1951-04-29 Today's Date: 07/19/2014    History of Present Illness Patient is a 63 y/o female s/p L TKA. PMH positive for HTN, arthritis and morbid obesity. WBAT LLE.    PT Comments    Patient continues to show very little participation with therapy and requires max encouragement and cueing to participate. Continue to recommend SNF for ongoing Physical Therapy.     Follow Up Recommendations  SNF;Supervision/Assistance - 24 hour     Equipment Recommendations       Recommendations for Other Services       Precautions / Restrictions Precautions Precautions: Knee;Fall Precaution Comments: reviewed precautions Restrictions LLE Weight Bearing: Weight bearing as tolerated    Mobility  Bed Mobility Overal bed mobility: Needs Assistance Bed Mobility: Sit to Supine       Sit to supine: Mod assist   General bed mobility comments: A for LEs and for trunk control  Transfers Overall transfer level: Needs assistance Equipment used: Rolling walker (2 wheeled)   Sit to Stand: +2 physical assistance;Mod assist         General transfer comment: +2 Mod A from recliner. A to power up and to stabilize into standing positioning. Cues for hand placement and posture.   Ambulation/Gait Ambulation/Gait assistance: Min assist Ambulation Distance (Feet): 4 Feet Assistive device: Rolling walker (2 wheeled) Gait Pattern/deviations: Step-to pattern;Trunk flexed;Decreased step length - right;Decreased step length - left   Gait velocity interpretation: Below normal speed for age/gender General Gait Details: Manual facilitation to move BLE. Max encouragement and cueing throughout. Patient leaning on RW throughout depsite cues.    Stairs            Wheelchair Mobility    Modified Rankin (Stroke Patients Only)       Balance                                    Cognition  Arousal/Alertness: Awake/alert Behavior During Therapy: Anxious Overall Cognitive Status: Within Functional Limits for tasks assessed                      Exercises Total Joint Exercises Quad Sets: AAROM;Left;5 reps Heel Slides: AAROM;Left;10 reps Hip ABduction/ADduction: AAROM;Left;10 reps Straight Leg Raises: PROM;Left;10 reps    General Comments        Pertinent Vitals/Pain      Home Living                      Prior Function            PT Goals (current goals can now be found in the care plan section) Progress towards PT goals: Progressing toward goals    Frequency  7X/week    PT Plan Current plan remains appropriate    Co-evaluation             End of Session Equipment Utilized During Treatment: Gait belt Activity Tolerance: Patient limited by fatigue Patient left: in chair;with call bell/phone within reach;with family/visitor present     Time: 1540-0867 PT Time Calculation (min): 24 min  Charges:  $Gait Training: 8-22 mins $Therapeutic Exercise: 8-22 mins                    G Codes:      Jacqualyn Posey 07/19/2014, 12:17 PM 07/19/2014 Demetry Bendickson, Tonia Brooms PTA  [phone] pager [phone] office

## 2014-07-19 NOTE — Progress Notes (Signed)
PATIENT ID: Lauren Santana  MRN: [id number]  DOB/AGE:  12/15/1950 / 63 y.o.  2 Days Post-Op Procedure(s) (LRB): TOTAL KNEE ARTHROPLASTY (Left)    PROGRESS NOTE Subjective: Patient is alert, oriented, no Nausea, no Vomiting, yes passing gas, no Bowel Movement. Taking PO well. Denies SOB, Chest or Calf Pain. Using Incentive Spirometer, PAS in place. Ambulate WBAT, CPM 0-40 Patient reports pain as 10 on 0-10 scale.   Pt did have a temp last night of 102.5.  She was encouraged to use inspirometer and drink fluids.  Temp is down to 100.9 at most recent check.  Pt up transferring to chair.  Objective: Vital signs in last 24 hours: Filed Vitals:   07/18/14 2159 07/18/14 2300 07/19/14 0200 07/19/14 0600  BP: 117/38   104/42  Pulse: 101   107  Temp: 102.5 F (39.2 C) 100.3 F (37.9 C) 100.3 F (37.9 C) 100.9 F (38.3 C)  TempSrc:  Oral Oral   Resp: 18   16  Height:      Weight:      SpO2: 92%   93%      Intake/Output from previous day: I/O last 3 completed shifts: In: 1590 [P.O.:840; I.V.:750] Out: 1150 [Urine:1150]   Intake/Output this shift:     LABORATORY DATA:  Recent Labs  07/18/14 0550 07/19/14 0531  WBC 7.7 8.9  HGB 10.3* 10.6*  HCT 31.6* 32.5*  PLT 112* 99*  NA 135*  --   K 3.7  --   CL 97  --   CO2 26  --   BUN 15  --   CREATININE 0.90  --   GLUCOSE 128*  --   CALCIUM 8.4  --     Examination: Neurologically intact Neurovascular intact Sensation intact distally Intact pulses distally Dorsiflexion/Plantar flexion intact Incision: dressing C/D/I No cellulitis present Compartment soft}  Assessment:   2 Days Post-Op Procedure(s) (LRB): TOTAL KNEE ARTHROPLASTY (Left) ADDITIONAL DIAGNOSIS: Expected Acute Blood Loss Anemia, Hypertension  Plan: PT/OT WBAT, CPM 5/hrs day until ROM 0-90 degrees, then D/C CPM DVT Prophylaxis:  SCDx72hrs, ASA 325 mg BID x 2 weeks DISCHARGE PLAN: Skilled Nursing Facility/Rehab, [facility] when pt passes  PT. DISCHARGE NEEDS: HHPT, HHRN, CPM, Walker and 3-in-1 comode seat     Mairely Foxworth R 07/19/2014, 7:56 AM

## 2014-07-19 NOTE — Progress Notes (Signed)
Patient temp 102.5. Patient been febrile all day from 100.0-100.8. Lungs diminished, productive cough with minimal thick tan secretions, able to reach 750 on incentive.  Incentive spirometer encouraged every hour with deep breathing and coughing and increase fluid intake. PRN tylenol given and temperature rechecked was 100.3. On call PA was notified. Orders to monitor and continue current regime of incentive spirometer, coughing, and encourage fluid intake. Will continue to monitor.

## 2014-07-20 ENCOUNTER — Inpatient Hospital Stay (HOSPITAL_COMMUNITY): Payer: Medicare HMO

## 2014-07-20 LAB — URINE CULTURE: Special Requests: NORMAL

## 2014-07-20 LAB — CBC
HCT: 31.5 % — ABNORMAL LOW (ref 36.0–46.0)
Hemoglobin: 10.2 g/dL — ABNORMAL LOW (ref 12.0–15.0)
MCH: 31.9 pg (ref 26.0–34.0)
MCHC: 32.4 g/dL (ref 30.0–36.0)
MCV: 98.4 fL (ref 78.0–100.0)
Platelets: 95 10*3/uL — ABNORMAL LOW (ref 150–400)
RBC: 3.2 MIL/uL — ABNORMAL LOW (ref 3.87–5.11)
RDW: 13.5 % (ref 11.5–15.5)
WBC: 7.7 10*3/uL (ref 4.0–10.5)

## 2014-07-20 NOTE — Progress Notes (Signed)
Patient ID: CHEVY VIRGO, female   DOB: 11-Mar-1951, 63 y.o.   MRN: [id number] PATIENT ID: KIEANA LIVESAY  MRN: [id number]  DOB/AGE:  18-Nov-1951 / 63 y.o.  3 Days Post-Op Procedure(s) (LRB): TOTAL KNEE ARTHROPLASTY (Left)    PROGRESS NOTE Subjective: Patient is alert, oriented, no Nausea, no Vomiting, yes passing gas, no Bowel Movement. Taking PO well. Denies SOB, Chest or Calf Pain. Using Incentive Spirometer, PAS in place. Ambulate 4 feet, physical therapy as recorded very limited effort, CPM 0-40 Patient reports pain as 4 on 0-10 scale  .    Objective: Vital signs in last 24 hours: Filed Vitals:   07/19/14 2148 07/19/14 2350 07/20/14 0400 07/20/14 0600  BP: 120/50   122/50  Pulse: 100   97  Temp: 99.9 F (37.7 C)   99.8 F (37.7 C)  TempSrc:      Resp: 16 18 18 16   Height:      Weight:      SpO2: 99%   98%      Intake/Output from previous day: I/O last 3 completed shifts: In: 2070 [P.O.:1320; I.V.:750] Out: -    Intake/Output this shift:     LABORATORY DATA:  Recent Labs  07/18/14 0550 07/19/14 0531 07/20/14 0639  WBC 7.7 8.9 7.7  HGB 10.3* 10.6* 10.2*  HCT 31.6* 32.5* 31.5*  PLT 112* 99* 95*  NA 135*  --   --   K 3.7  --   --   CL 97  --   --   CO2 26  --   --   BUN 15  --   --   CREATININE 0.90  --   --   GLUCOSE 128*  --   --   CALCIUM 8.4  --   --     Examination: Neurologically intact ABD soft Neurovascular intact Sensation intact distally Intact pulses distally Dorsiflexion/Plantar flexion intact Incision: dressing C/D/I No cellulitis present Compartment soft}  Assessment:   3 Days Post-Op Procedure(s) (LRB): TOTAL KNEE ARTHROPLASTY (Left) ADDITIONAL DIAGNOSIS: Expected Acute Blood Loss Anemia, Hypertension, morbid super obesity  Plan: PT/OT WBAT, CPM 5/hrs day until ROM 0-90 degrees, then D/C CPM DVT Prophylaxis:  SCDx72hrs, ASA 325 mg BID x 2 weeks DISCHARGE PLAN: Skilled Nursing Facility/Rehab, [facility] place DISCHARGE  NEEDS: HHPT, HHRN, CPM, Walker and 3-in-1 comode seat     Agapito Hanway J 07/20/2014, 8:00 AM

## 2014-07-20 NOTE — Discharge Summary (Signed)
Patient ID: Lauren Santana MRN: [id number] DOB/AGE: 06-02-1951 63 y.o.  Admit date: 07/17/2014 Discharge date: 07/20/2014  Admission Diagnoses:  Active Problems:   Arthritis of knee   Discharge Diagnoses:  Same  Past Medical History  Diagnosis Date  . Hypertension   . High cholesterol   . Arthritis   . Wears glasses   . Numbness     B/LLE  . Edema     B/LLE    Surgeries: Procedure(s): TOTAL KNEE ARTHROPLASTY on 07/17/2014   Consultants:    Discharged Condition: Improved  Hospital Course: Lauren Santana is an 63 y.o. female who was admitted 07/17/2014 for operative treatment of<principal problem not specified>. Patient has severe unremitting pain that affects sleep, daily activities, and work/hobbies. After pre-op clearance the patient was taken to the operating room on 07/17/2014 and underwent  Procedure(s): TOTAL KNEE ARTHROPLASTY, using cemented DePuy Sigma RP implants.  Patient was given perioperative antibiotics: Anti-infectives   Start     Dose/Rate Route Frequency Ordered Stop   07/17/14 0807  cefUROXime (ZINACEF) injection  Status:  Discontinued       As needed 07/17/14 0807 07/17/14 1017   07/17/14 0600  ceFAZolin (ANCEF) IVPB 2 g/50 mL premix     2 g 100 mL/hr over 30 Minutes Intravenous On call to O.R. 07/16/14 1353 07/17/14 0750       Patient was given sequential compression devices, early ambulation, and chemoprophylaxis to prevent DVT.  Patient benefited maximally from hospital stay and there were no complications.    Recent vital signs: Patient Vitals for the past 24 hrs:  BP Temp Temp src Pulse Resp SpO2  07/20/14 0600 122/50 mmHg 99.8 F (37.7 C) - 97 16 98 %  07/20/14 0400 - - - - 18 -  07/19/14 2350 - - - - 18 -  07/19/14 2148 120/50 mmHg 99.9 F (37.7 C) - 100 16 99 %  07/19/14 2000 - - - - 17 99 %  07/19/14 1900 - 100.1 F (37.8 C) Oral - - -  07/19/14 1300 - 102.4 F (39.1 C) - 105 18 93 %  07/19/14 1200 - - - - 18 -      Recent laboratory studies:  Recent Labs  07/18/14 0550 07/19/14 0531 07/20/14 0639  WBC 7.7 8.9 7.7  HGB 10.3* 10.6* 10.2*  HCT 31.6* 32.5* 31.5*  PLT 112* 99* 95*  NA 135*  --   --   K 3.7  --   --   CL 97  --   --   CO2 26  --   --   BUN 15  --   --   CREATININE 0.90  --   --   GLUCOSE 128*  --   --   CALCIUM 8.4  --   --      Discharge Medications:     Medication List    STOP taking these medications       traMADol 50 MG tablet  Commonly known as:  ULTRAM      TAKE these medications       aspirin EC 325 MG tablet  Take 1 tablet (325 mg total) by mouth 2 (two) times daily.     gabapentin 300 MG capsule  Commonly known as:  NEURONTIN  Take 300 mg by mouth 3 (three) times daily.     indapamide 1.25 MG tablet  Commonly known as:  LOZOL  Take 1.25 mg by mouth daily.     labetalol 100  MG tablet  Commonly known as:  NORMODYNE  Take 100 mg by mouth 2 (two) times daily.     lisinopril 10 MG tablet  Commonly known as:  PRINIVIL,ZESTRIL  Take 10 mg by mouth at bedtime.     methocarbamol 500 MG tablet  Commonly known as:  ROBAXIN  1 tablet q 6hr prn muscle spasm     oxyCODONE-acetaminophen 5-325 MG per tablet  Commonly known as:  ROXICET  Take 1 tablet by mouth every 4 (four) hours as needed for severe pain.     potassium chloride 10 MEQ tablet  Commonly known as:  K-DUR,KLOR-CON  Take 10 mEq by mouth daily.     pravastatin 20 MG tablet  Commonly known as:  PRAVACHOL  Take 20 mg by mouth at bedtime.     torsemide 20 MG tablet  Commonly known as:  DEMADEX  Take 20 mg by mouth daily.        Diagnostic Studies: Dg Chest 2 View  07/19/2014   CLINICAL DATA:  Fever 2 days after knee replacement.  EXAM: CHEST  2 VIEW  COMPARISON:  07/07/2014.  FINDINGS: AP and lateral views of the chest show no pulmonary edema or focal airspace consolidation. There is some atelectasis in the left base. The cardio pericardial silhouette is enlarged. Imaged bony  structures of the thorax are intact.  IMPRESSION: Borderline cardiomegaly with left base atelectasis.   Electronically Signed   By: Misty Stanley M.D.   On: 07/19/2014 17:19   Dg Chest 2 View  07/07/2014   CLINICAL DATA:  Preop for left knee replacement.  Hypertension.  EXAM: CHEST  2 VIEW  COMPARISON:  None.  FINDINGS: Cardiac silhouette is normal in size. Normal mediastinal and hilar contours. Lungs are clear. No pleural effusion or pneumothorax.  Bony thorax is demineralized but intact.  IMPRESSION: No active cardiopulmonary disease.   Electronically Signed   By: Lajean Manes M.D.   On: 07/07/2014 13:05    Disposition: Skilled nursing [facility] place for rehabilitation      Discharge Instructions   CPM    Complete by:  As directed   Continuous passive motion machine (CPM):      Use the CPM from 0 to 40 for 5 hours per day.      You may increase by 10 degrees per day.  You may break it up into 2 or 3 sessions per day.      Use CPM for 2 weeks or until you are told to stop.     Call MD / Call 911    Complete by:  As directed   If you experience chest pain or shortness of breath, CALL 911 and be transported to the hospital emergency room.  If you develope a fever above 101 F, pus (white drainage) or increased drainage or redness at the wound, or calf pain, call your surgeon's office.     Change dressing    Complete by:  As directed   Change dressing on prn, then change the dressing daily with sterile 4 x 4 inch gauze dressing and apply TED hose.  You may clean the incision with alcohol prior to redressing.     Constipation Prevention    Complete by:  As directed   Drink plenty of fluids.  Prune juice may be helpful.  You may use a stool softener, such as Colace (over the counter) 100 mg twice a day.  Use MiraLax (over the counter) for constipation as needed.  Diet - low sodium heart healthy    Complete by:  As directed      Do not put a pillow under the knee. Place it under the  heel.    Complete by:  As directed      Increase activity slowly as tolerated    Complete by:  As directed            Follow-up Information   Follow up with Kerin Salen, MD In 1 week.   Specialty:  Orthopedic Surgery   Contact information:   [place] [id number] [phone]        Signed: Kerin Salen 07/20/2014, 8:06 AM

## 2014-07-20 NOTE — Progress Notes (Signed)
Physical Therapy Treatment Patient Details Name: Lauren Santana MRN: [id number] DOB: 1951-07-25 Today's Date: 07/20/2014    History of Present Illness Patient is a 63 y/o female s/p L TKA. PMH positive for HTN, arthritis and morbid obesity. WBAT LLE.    PT Comments    Patient continues to be limited by pain and fatigue. Moving slightly more smoothly today. Planning to DC to SNF later today for continued therapies  Follow Up Recommendations  SNF;Supervision/Assistance - 24 hour     Equipment Recommendations       Recommendations for Other Services       Precautions / Restrictions Precautions Precautions: Knee;Fall Restrictions Weight Bearing Restrictions: Yes LLE Weight Bearing: Weight bearing as tolerated    Mobility  Bed Mobility Overal bed mobility: Needs Assistance Bed Mobility: Sit to Supine     Supine to sit: Mod assist;HOB elevated     General bed mobility comments: A for LEs and for trunk control  Transfers Overall transfer level: Needs assistance Equipment used: Rolling walker (2 wheeled)   Sit to Stand: +2 physical assistance;Mod assist         General transfer comment: +2 Mod A from bed. A to power up and to stabilize into standing positioning. Cues for hand placement and R LE positioning to stand. Patient did utilize rocking technique this session  Ambulation/Gait Ambulation/Gait assistance: Min assist Ambulation Distance (Feet): 5 Feet Assistive device: Rolling walker (2 wheeled) Gait Pattern/deviations: Step-to pattern;Decreased stance time - right;Decreased step length - left;Trunk flexed     General Gait Details: Patient able to move LE today with increased time. Patient still lean on RW with transfer. Cues for postioning and posture.    Stairs            Wheelchair Mobility    Modified Rankin (Stroke Patients Only)       Balance                                    Cognition Arousal/Alertness:  Awake/alert Behavior During Therapy: WFL for tasks assessed/performed Overall Cognitive Status: Within Functional Limits for tasks assessed       Memory: Decreased short-term memory              Exercises Total Joint Exercises Quad Sets: AAROM;Left;5 reps Heel Slides: AAROM;Left;10 reps Hip ABduction/ADduction: AAROM;Left;10 reps Straight Leg Raises: Left;10 reps;AAROM (limited range)    General Comments        Pertinent Vitals/Pain Pain Score: 7  Pain Location: L knee pain Pain Descriptors / Indicators: Sore Pain Intervention(s): Patient requesting pain meds-RN notified;Monitored during session;Limited activity within patient's tolerance    Home Living                      Prior Function            PT Goals (current goals can now be found in the care plan section) Progress towards PT goals: Progressing toward goals (vrey slowly)    Frequency  7X/week    PT Plan Current plan remains appropriate    Co-evaluation             End of Session Equipment Utilized During Treatment: Gait belt Activity Tolerance: Patient limited by fatigue Patient left: in chair;with call bell/phone within reach     Time: 6789-3810 PT Time Calculation (min): 20 min  Charges:  $Gait Training: 8-22 mins  G Codes:      Jacqualyn Posey 07/20/2014, 11:04 AM 07/20/2014 Jacqualyn Posey PTA [phone] pager [phone] office

## 2014-07-21 ENCOUNTER — Non-Acute Institutional Stay (SKILLED_NURSING_FACILITY): Payer: Commercial Managed Care - HMO | Admitting: Adult Health

## 2014-07-21 ENCOUNTER — Other Ambulatory Visit: Payer: Self-pay | Admitting: *Deleted

## 2014-07-21 DIAGNOSIS — R209 Unspecified disturbances of skin sensation: Secondary | ICD-10-CM

## 2014-07-21 DIAGNOSIS — R609 Edema, unspecified: Secondary | ICD-10-CM

## 2014-07-21 DIAGNOSIS — I10 Essential (primary) hypertension: Secondary | ICD-10-CM

## 2014-07-21 DIAGNOSIS — Z96659 Presence of unspecified artificial knee joint: Secondary | ICD-10-CM

## 2014-07-21 DIAGNOSIS — M1710 Unilateral primary osteoarthritis, unspecified knee: Secondary | ICD-10-CM

## 2014-07-21 DIAGNOSIS — Z96652 Presence of left artificial knee joint: Secondary | ICD-10-CM

## 2014-07-21 DIAGNOSIS — IMO0002 Reserved for concepts with insufficient information to code with codable children: Secondary | ICD-10-CM

## 2014-07-21 DIAGNOSIS — R200 Anesthesia of skin: Secondary | ICD-10-CM

## 2014-07-21 DIAGNOSIS — E785 Hyperlipidemia, unspecified: Secondary | ICD-10-CM

## 2014-07-21 MED ORDER — OXYCODONE-ACETAMINOPHEN 5-325 MG PO TABS: 1.0000 | ORAL_TABLET | ORAL | Status: DC | PRN

## 2014-07-21 NOTE — Telephone Encounter (Signed)
[facility] 

## 2014-07-23 ENCOUNTER — Encounter: Payer: Self-pay | Admitting: Adult Health

## 2014-07-23 DIAGNOSIS — R200 Anesthesia of skin: Secondary | ICD-10-CM | POA: Insufficient documentation

## 2014-07-23 DIAGNOSIS — Z96652 Presence of left artificial knee joint: Secondary | ICD-10-CM | POA: Insufficient documentation

## 2014-07-23 DIAGNOSIS — E785 Hyperlipidemia, unspecified: Secondary | ICD-10-CM | POA: Insufficient documentation

## 2014-07-23 DIAGNOSIS — I10 Essential (primary) hypertension: Secondary | ICD-10-CM | POA: Insufficient documentation

## 2014-07-23 DIAGNOSIS — R609 Edema, unspecified: Secondary | ICD-10-CM | POA: Insufficient documentation

## 2014-07-23 NOTE — Progress Notes (Signed)
Patient ID: Lauren Santana, female   DOB: [birth year], 63 y.o.   MRN: [id number]     [facility]  Allergies  Allergen Reactions  . Sulfa Antibiotics Other (See Comments)    Unknown allergic reaction     Chief Complaint  Patient presents with  . Hospitalization Follow-up    HPI:  She has been hospitalized for a left knee replacement and is here for short term replacement. Her pain is being adequately managed at this time and she is participating with therapy. Her goal is to return home as soon as she is able to. There are no concerns being voiced by the nursing staff at this time.    Past Medical History  Diagnosis Date  . Hypertension   . High cholesterol   . Arthritis   . Wears glasses   . Numbness     B/LLE  . Edema     B/LLE    Past Surgical History  Procedure Laterality Date  . Cesarean section  1969, 1971  . Bilateral salpingoophorectomy  2001  . Hernia repair  2001    incisional  . Multiple tooth extractions  2011  . Colonoscopy    . Total knee arthroplasty Left 07/17/2014    Procedure: TOTAL KNEE ARTHROPLASTY;  Surgeon: Kerin Salen, MD;  Location: [place];  Service: Orthopedics;  Laterality: Left;    VITAL SIGNS BP 127/55  Pulse 70  Ht 5\' 3"  (1.6 m)  Wt 250 lb (113.399 kg)  BMI 44.30 kg/m2   Patient's Medications  New Prescriptions   No medications on file  Previous Medications   ASPIRIN EC 325 MG TABLET    Take 1 tablet (325 mg total) by mouth 2 (two) times daily.   ATORVASTATIN (LIPITOR) 10 MG TABLET    Take 10 mg by mouth daily.   GABAPENTIN (NEURONTIN) 300 MG CAPSULE    Take 300 mg by mouth 3 (three) times daily.   INDAPAMIDE (LOZOL) 1.25 MG TABLET    Take 1.25 mg by mouth daily.   LABETALOL (NORMODYNE) 100 MG TABLET    Take 100 mg by mouth 2 (two) times daily.   LISINOPRIL (PRINIVIL,ZESTRIL) 10 MG TABLET    Take 10 mg by mouth at bedtime.    METHOCARBAMOL (ROBAXIN) 500 MG TABLET    1 tablet q 6hr prn muscle spasm   OXYCODONE-ACETAMINOPHEN (ROXICET) 5-325 MG PER TABLET    Take 1 tablet by mouth every 4 (four) hours as needed for severe pain.   POTASSIUM CHLORIDE (K-DUR,KLOR-CON) 10 MEQ TABLET    Take 10 mEq by mouth daily.   TORSEMIDE (DEMADEX) 20 MG TABLET    Take 20 mg by mouth daily.  Modified Medications   No medications on file  Discontinued Medications   PRAVASTATIN (PRAVACHOL) 20 MG TABLET    Take 20 mg by mouth at bedtime.     SIGNIFICANT DIAGNOSTIC EXAMS  07-19-14: chest x-ray: Borderline cardiomegaly with left base atelectasis.  07-20-14: left knee x-ray: Left knee prosthesis in anatomic alignment.     LABS REVIEWED:   07-18-14: wbc 7.7; hgb 10.3; hct 31.6 ;mcv 99.1; plt 112; glucose 128; bun 15; creat 0.9; k+3.7; na++135 07-20-14: wbc 7.7; hgb 10.2; hct 31.5 ;mcv 98.4; plt 95    Review of Systems  Constitutional: Negative for malaise/fatigue.  Respiratory: Negative for cough and shortness of breath.   Cardiovascular: Negative for chest pain, palpitations and leg swelling.  Gastrointestinal: Negative for heartburn, abdominal pain and constipation.  Musculoskeletal: Negative for  joint pain and myalgias.  Skin: Negative.        Has left knee incision line   Neurological: Negative for headaches.  Psychiatric/Behavioral: Negative for depression. The patient is not nervous/anxious.      Physical Exam  Constitutional: She is oriented to person, place, and time. She appears well-developed and well-nourished. No distress.  obese  Neck: Neck supple. No JVD present. No thyromegaly present.  Cardiovascular: Normal rate, regular rhythm and intact distal pulses.   Respiratory: Effort normal and breath sounds normal. No respiratory distress. She has no wheezes.  GI: Soft. Bowel sounds are normal. She exhibits no distension. There is no tenderness.  Musculoskeletal: She exhibits no edema.  Is able to move all extremities; is status post left knee replacement   Neurological: She is alert and  oriented to person, place, and time.  Skin: Skin is warm and dry. She is not diaphoretic.  Left knee incision line without signs of infection present   Psychiatric: She has a normal mood and affect.       ASSESSMENT/ PLAN:  1. Osteoarthritis of knee status post left knee replacement: will continue therapy as directed; will continue to be followed by orthopedics. Will continue percocet 5/325 mg every 4 hours as needed and robaxin 500 mg every 6 hours as needed and will monitor her status   2. Edema: will continue demadex 20 mg daily with k+ 10 meq daily   3. Hypertension: will continue lozol 1.2 5 mg daily; lisinopril 10 mg daily and labetalol 100 mg twice daily asa 325 mg daily and will monitor   4. Numbness to her bilateral lower extremities: will continue neurontin 300 mg three times daily   5. Dyslipidemia: will continue lipitor 10 mg daily    Time spent with patient 50 minutes.    Ok Edwards NP [facility]  Contact [phone] Monday through Friday 8am- 5pm  After hours call [phone]

## 2014-07-24 ENCOUNTER — Encounter: Payer: Self-pay | Admitting: Internal Medicine

## 2014-07-24 ENCOUNTER — Non-Acute Institutional Stay (SKILLED_NURSING_FACILITY): Payer: Commercial Managed Care - HMO | Admitting: Internal Medicine

## 2014-07-24 DIAGNOSIS — I10 Essential (primary) hypertension: Secondary | ICD-10-CM

## 2014-07-24 DIAGNOSIS — K5900 Constipation, unspecified: Secondary | ICD-10-CM

## 2014-07-24 DIAGNOSIS — IMO0002 Reserved for concepts with insufficient information to code with codable children: Secondary | ICD-10-CM

## 2014-07-24 DIAGNOSIS — M1710 Unilateral primary osteoarthritis, unspecified knee: Secondary | ICD-10-CM

## 2014-07-24 DIAGNOSIS — R609 Edema, unspecified: Secondary | ICD-10-CM

## 2014-07-24 NOTE — Progress Notes (Signed)
Patient ID: Lauren Santana, female   DOB: Nov 23, 1951, 63 y.o.   MRN: [id number]     Facility: [facility]    PCP: Becky Sax, MD  Code Status: full code  Allergies  Allergen Reactions  . Sulfa Antibiotics Other (See Comments)    Unknown allergic reaction    Chief Complaint: new admission  HPI:  63 y/o female patient is here for STR after hospital admission with severe left knee OA and underwent left total knee arthroplasty. She is here for rehabilitation. She mentions that her pain is under control. Has been constipated for few days. Denies any other complaint  Review of Systems:  Constitutional: Negative for fever, chills, weight loss, malaise/fatigue and diaphoresis.  HENT: Negative for congestion, hearing loss and sore throat.   Eyes: Negative for eye pain, blurred vision Respiratory: Negative for cough, sputum production, shortness of breath and wheezing.   Cardiovascular: Negative for chest pain, palpitations. Has noticed some swelling in left leg  Gastrointestinal: Negative for heartburn, nausea, vomiting, abdominal pain Genitourinary: Negative for dysuria Musculoskeletal: Negative for back pain, falls Skin: Negative for itching and rash.  Neurological: Negative for dizziness, tingling, focal weakness and headaches.  Psychiatric/Behavioral: Negative for depression   Past Medical History  Diagnosis Date  . Hypertension   . High cholesterol   . Arthritis   . Wears glasses   . Numbness     B/LLE  . Edema     B/LLE   Past Surgical History  Procedure Laterality Date  . Cesarean section  1969, 1971  . Bilateral salpingoophorectomy  2001  . Hernia repair  2001    incisional  . Multiple tooth extractions  2011  . Colonoscopy    . Total knee arthroplasty Left 07/17/2014    Procedure: TOTAL KNEE ARTHROPLASTY;  Surgeon: Kerin Salen, MD;  Location: [place];  Service: Orthopedics;  Laterality: Left;   Social History:   reports  that she has never smoked. She has never used smokeless tobacco. She reports that she does not drink alcohol or use illicit drugs.  History reviewed. No pertinent family history.  Medications: Patient's Medications  New Prescriptions   No medications on file  Previous Medications   ASPIRIN EC 325 MG TABLET    Take 1 tablet (325 mg total) by mouth 2 (two) times daily.   ATORVASTATIN (LIPITOR) 10 MG TABLET    Take 10 mg by mouth daily.   GABAPENTIN (NEURONTIN) 300 MG CAPSULE    Take 300 mg by mouth 3 (three) times daily.   INDAPAMIDE (LOZOL) 1.25 MG TABLET    Take 1.25 mg by mouth daily.   LABETALOL (NORMODYNE) 100 MG TABLET    Take 100 mg by mouth 2 (two) times daily.   LISINOPRIL (PRINIVIL,ZESTRIL) 10 MG TABLET    Take 10 mg by mouth at bedtime.    METHOCARBAMOL (ROBAXIN) 500 MG TABLET    1 tablet q 6hr prn muscle spasm   OXYCODONE-ACETAMINOPHEN (ROXICET) 5-325 MG PER TABLET    Take 1 tablet by mouth every 4 (four) hours as needed for severe pain.   POTASSIUM CHLORIDE (K-DUR,KLOR-CON) 10 MEQ TABLET    Take 10 mEq by mouth daily.   TORSEMIDE (DEMADEX) 20 MG TABLET    Take 20 mg by mouth daily.  Modified Medications   No medications on file  Discontinued Medications   No medications on file     Physical Exam: Filed Vitals:   07/24/14 1712  BP: 109/62  Pulse: 88  Temp: 98 F (36.7 C)  Resp: 18  SpO2: 96%    General- elderly female in no acute distress, obese Head- atraumatic, normocephalic Eyes- PERRLA, EOMI, no pallor, no icterus, no discharge Neck- no lymphadenopathy, no thyromegaly, no jugular vein distension, no carotid bruit Ears- left ear normal tympanic membrane  Throat- moist mucus membrane Cardiovascular- normal s1,s2, no murmurs Respiratory- bilateral clear to auscultation, no wheeze, no rhonchi, no crackles, no use of accessory muscles Abdomen- bowel sounds present, soft, non tender Musculoskeletal- able to move all 4 extremities, ROM in left knee limited, left  leg 1+ edema  Neurological- no focal deficit Skin- warm and dry, incision site healing well, mild erythema in leg, normal temperature, normal distal pulses, no signs of infection Psychiatry- alert and oriented   Labs reviewed: Basic Metabolic Panel:  Recent Labs  07/07/14 1234 07/18/14 0550  NA 142 135*  K 4.0 3.7  CL 102 97  CO2 26 26  GLUCOSE 79 128*  BUN 10 15  CREATININE 0.95 0.90  CALCIUM 9.6 8.4   CBC:  Recent Labs  07/07/14 1234 07/18/14 0550 07/19/14 0531 07/20/14 0639  WBC 5.6 7.7 8.9 7.7  NEUTROABS 2.6  --   --   --   HGB 12.0 10.3* 10.6* 10.2*  HCT 37.0 31.6* 32.5* 31.5*  MCV 98.7 99.1 97.0 98.4  PLT 142* 112* 99* 95*    Radiological Exams: 07-19-14: chest x-ray: Borderline cardiomegaly with left base atelectasis.  07-20-14: left knee x-ray: Left knee prosthesis in anatomic alignment.   Assessment/Plan  Left knee OA S/p knee arthroplasty. Will have patient work with PT/OT as tolerated to regain strength and restore function.  Fall precautions are in place. Continue current pain regimen roxicet and robaxin. Continue gabapentin for her nerve pain. On aspirin 325 mg bid for dvt prophylaxis  constipation Add senna-s 1 po bid for constipation  Leg edema Stable on demadex and kcl supplement, monitor bmp  HTN bp stable, infact on lower side of normal. On indapamide with labetalol and lisinopril, monitor bp to assess for iatrogenic hypotension   Family/ staff Communication: reviewed care plan with patient and nursing supervisor   Goals of care: short term rehabilitation    Labs/tests ordered: cbc, cmp    Blanchie Serve, MD  [facility] [phone] (Monday-Friday 8 am - 5 pm) [phone] (afterhours)

## 2014-07-27 ENCOUNTER — Non-Acute Institutional Stay (SKILLED_NURSING_FACILITY): Payer: Commercial Managed Care - HMO | Admitting: Adult Health

## 2014-07-27 ENCOUNTER — Other Ambulatory Visit: Payer: Self-pay | Admitting: *Deleted

## 2014-07-27 DIAGNOSIS — IMO0002 Reserved for concepts with insufficient information to code with codable children: Secondary | ICD-10-CM

## 2014-07-27 DIAGNOSIS — Z96659 Presence of unspecified artificial knee joint: Secondary | ICD-10-CM

## 2014-07-27 DIAGNOSIS — I10 Essential (primary) hypertension: Secondary | ICD-10-CM

## 2014-07-27 DIAGNOSIS — Z96652 Presence of left artificial knee joint: Secondary | ICD-10-CM

## 2014-07-27 DIAGNOSIS — R609 Edema, unspecified: Secondary | ICD-10-CM

## 2014-07-27 DIAGNOSIS — M1710 Unilateral primary osteoarthritis, unspecified knee: Secondary | ICD-10-CM

## 2014-07-27 MED ORDER — OXYCODONE-ACETAMINOPHEN 5-325 MG PO TABS: ORAL_TABLET | ORAL | Status: DC

## 2014-07-27 NOTE — Telephone Encounter (Signed)
[facility] 

## 2014-08-01 ENCOUNTER — Other Ambulatory Visit: Payer: Self-pay | Admitting: *Deleted

## 2014-08-01 MED ORDER — HYDROCODONE-ACETAMINOPHEN 5-325 MG PO TABS: 1.0000 | ORAL_TABLET | ORAL | Status: DC | PRN

## 2014-08-06 NOTE — Progress Notes (Signed)
Patient ID: Lauren Santana, female   DOB: [birth year], 63 y.o.   MRN: [id number]     [facility]  Allergies  Allergen Reactions  . Sulfa Antibiotics Other (See Comments)    Unknown allergic reaction     Chief Complaint  Patient presents with  . Discharge Note    HPI:  She is being discharged to home for a left knee replacement with  Home health for pt/ot/aid. She will need a front wheel walker in order to maintain her current level of independence with her adl's. She will need prescriptions to be written. She has had her follow up with her orthopedic today who changed her percocet to vicodin due to complaints of nausea from her pain medication. She will need a follow up with her pcp  Past Medical History  Diagnosis Date  . Hypertension   . High cholesterol   . Arthritis   . Wears glasses   . Numbness     B/LLE  . Edema     B/LLE    Past Surgical History  Procedure Laterality Date  . Cesarean section  1969, 1971  . Bilateral salpingoophorectomy  2001  . Hernia repair  2001    incisional  . Multiple tooth extractions  2011  . Colonoscopy    . Total knee arthroplasty Left 07/17/2014    Procedure: TOTAL KNEE ARTHROPLASTY;  Surgeon: Kerin Salen, MD;  Location: [place];  Service: Orthopedics;  Laterality: Left;    VITAL SIGNS BP 133/79  Pulse 80  Ht 5\' 3"  (1.6 m)  Wt 250 lb (113.399 kg)  BMI 44.30 kg/m2   Patient's Medications  New Prescriptions   No medications on file  Previous Medications   ASPIRIN EC 325 MG TABLET    Take 1 tablet (325 mg total) by mouth 2 (two) times daily.   ATORVASTATIN (LIPITOR) 10 MG TABLET    Take 10 mg by mouth daily.   GABAPENTIN (NEURONTIN) 300 MG CAPSULE    Take 300 mg by mouth 3 (three) times daily.   HYDROCODONE-ACETAMINOPHEN (NORCO/VICODIN) 5-325 MG PER TABLET    Take 1 tablet by mouth every 6 hours as needed for moderate pain.   INDAPAMIDE (LOZOL) 1.25 MG TABLET    Take 1.25 mg by mouth daily.   LABETALOL (NORMODYNE) 100  MG TABLET    Take 100 mg by mouth 2 (two) times daily.   LISINOPRIL (PRINIVIL,ZESTRIL) 10 MG TABLET    Take 10 mg by mouth at bedtime.    METHOCARBAMOL (ROBAXIN) 500 MG TABLET    1 tablet q 6hr prn muscle spasm   POTASSIUM CHLORIDE (K-DUR,KLOR-CON) 10 MEQ TABLET    Take 10 mEq by mouth daily.   TORSEMIDE (DEMADEX) 20 MG TABLET    Take 20 mg by mouth daily.  Modified Medications   No medications on file  Discontinued Medications   No medications on file    SIGNIFICANT DIAGNOSTIC EXAMS   07-19-14: chest x-ray: Borderline cardiomegaly with left base atelectasis.  07-20-14: left knee x-ray: Left knee prosthesis in anatomic alignment.     LABS REVIEWED:   07-18-14: wbc 7.7; hgb 10.3; hct 31.6 ;mcv 99.1; plt 112; glucose 128; bun 15; creat 0.9; k+3.7; na++135 07-20-14: wbc 7.7; hgb 10.2; hct 31.5 ;mcv 98.4; plt 95    Review of Systems  Constitutional: Negative for malaise/fatigue.  Respiratory: Negative for cough and shortness of breath.   Cardiovascular: Negative for chest pain, palpitations and leg swelling.  Gastrointestinal: Negative for heartburn, abdominal  pain and constipation.  Musculoskeletal: Negative for joint pain and myalgias. pain managed  Skin: Negative.        Has left knee incision line    Neurological: Negative for headaches.  Psychiatric/Behavioral: Negative for depression. The patient is not nervous/anxious.      Physical Exam  Constitutional: She is oriented to person, place, and time. She appears well-developed and well-nourished. No distress.  obese  Neck: Neck supple. No JVD present. No thyromegaly present.  Cardiovascular: Normal rate, regular rhythm and intact distal pulses.   Respiratory: Effort normal and breath sounds normal. No respiratory distress. She has no wheezes.  GI: Soft. Bowel sounds are normal. She exhibits no distension. There is no tenderness.  Musculoskeletal: She exhibits no edema.  Is able to move all extremities; is status post left  knee replacement   Neurological: She is alert and oriented to person, place, and time.  Skin: Skin is warm and dry. She is not diaphoretic.  Left knee incision line without signs of infection present   Psychiatric: She has a normal mood and affect.       ASSESSMENT/ PLAN:  Will discharge her to home with home health for pt/ot/aid to improve upon her gait, strength, independence with adl's and adl care. She will need a front wheel walker. Her prescriptions have been written for a 30 day supply of her medications will send her home with the prescription from her orthopedic for vicodin 5/325 mg #60 tabs. She has a follow up with her pcp: Dr. Lucianne Lei on 08-08-14: 3:15pm    Time spent with patient 40 minutes.      Ok Edwards NP [facility]  Contact [phone] Monday through Friday 8am- 5pm  After hours call [phone]

## 2014-08-24 ENCOUNTER — Other Ambulatory Visit: Payer: Self-pay | Admitting: Adult Health

## 2014-08-31 ENCOUNTER — Other Ambulatory Visit: Payer: Self-pay | Admitting: Adult Health

## 2014-09-25 ENCOUNTER — Encounter: Payer: Self-pay | Admitting: Internal Medicine

## 2014-10-06 ENCOUNTER — Other Ambulatory Visit: Payer: Self-pay | Admitting: Nurse Practitioner

## 2014-11-03 ENCOUNTER — Other Ambulatory Visit: Payer: Self-pay | Admitting: Internal Medicine

## 2015-02-09 ENCOUNTER — Other Ambulatory Visit: Payer: Self-pay | Admitting: Nurse Practitioner

## 2015-02-09 ENCOUNTER — Other Ambulatory Visit: Payer: Self-pay | Admitting: Adult Health

## 2015-02-12 ENCOUNTER — Other Ambulatory Visit: Payer: Self-pay | Admitting: Adult Health

## 2015-02-12 ENCOUNTER — Other Ambulatory Visit: Payer: Self-pay | Admitting: Internal Medicine

## 2015-02-13 ENCOUNTER — Other Ambulatory Visit: Payer: Self-pay | Admitting: Internal Medicine

## 2015-02-14 ENCOUNTER — Other Ambulatory Visit: Payer: Self-pay | Admitting: *Deleted

## 2015-02-14 MED ORDER — ATORVASTATIN CALCIUM 10 MG PO TABS: ORAL_TABLET | ORAL | Status: DC

## 2015-07-10 ENCOUNTER — Other Ambulatory Visit: Payer: Self-pay | Admitting: Obstetrics & Gynecology

## 2015-07-10 DIAGNOSIS — N939 Abnormal uterine and vaginal bleeding, unspecified: Secondary | ICD-10-CM

## 2015-07-12 ENCOUNTER — Ambulatory Visit
Admission: RE | Admit: 2015-07-12 | Discharge: 2015-07-12 | Disposition: A | Discharge status: Home / Self Care | Payer: Medicare HMO | Source: Ambulatory Visit | Attending: Obstetrics & Gynecology | Admitting: Obstetrics & Gynecology

## 2015-07-12 DIAGNOSIS — N939 Abnormal uterine and vaginal bleeding, unspecified: Secondary | ICD-10-CM

## 2015-07-24 ENCOUNTER — Other Ambulatory Visit: Payer: Self-pay | Admitting: Obstetrics

## 2015-07-24 DIAGNOSIS — N898 Other specified noninflammatory disorders of vagina: Secondary | ICD-10-CM

## 2015-08-06 ENCOUNTER — Ambulatory Visit
Admission: RE | Admit: 2015-08-06 | Discharge: 2015-08-06 | Disposition: A | Discharge status: Home / Self Care | Payer: Commercial Managed Care - HMO | Source: Ambulatory Visit | Attending: Obstetrics | Admitting: Obstetrics

## 2015-08-06 DIAGNOSIS — N898 Other specified noninflammatory disorders of vagina: Secondary | ICD-10-CM

## 2015-08-06 MED ORDER — GADOBENATE DIMEGLUMINE 529 MG/ML IV SOLN
20.0000 mL | Freq: Once | INTRAVENOUS | Status: AC | PRN
  Administered 2015-08-06: 20 mL via INTRAVENOUS

## 2015-08-20 ENCOUNTER — Ambulatory Visit: Payer: Commercial Managed Care - HMO | Attending: Gynecologic Oncology | Admitting: Gynecologic Oncology

## 2015-08-20 ENCOUNTER — Other Ambulatory Visit: Payer: Self-pay | Admitting: Internal Medicine

## 2015-08-20 ENCOUNTER — Encounter: Payer: Self-pay | Admitting: Gynecologic Oncology

## 2015-08-20 VITALS — BP 136/57 | HR 77 | Temp 98.3°F | Resp 18 | Ht 63.0 in | Wt 238.2 lb

## 2015-08-20 DIAGNOSIS — Z6841 Body Mass Index (BMI) 40.0 and over, adult: Secondary | ICD-10-CM | POA: Insufficient documentation

## 2015-08-20 DIAGNOSIS — R1900 Intra-abdominal and pelvic swelling, mass and lump, unspecified site: Secondary | ICD-10-CM | POA: Insufficient documentation

## 2015-08-20 DIAGNOSIS — E669 Obesity, unspecified: Secondary | ICD-10-CM | POA: Insufficient documentation

## 2015-08-20 DIAGNOSIS — N950 Postmenopausal bleeding: Secondary | ICD-10-CM | POA: Diagnosis not present

## 2015-08-20 NOTE — Patient Instructions (Signed)
You will receive a phone call from [facility] to arrange for a CT guided biopsy.  You will also see Dr. Denman George three to four days after having your biopsy.  Please call for any questions or concerns.

## 2015-08-20 NOTE — Progress Notes (Signed)
Consult Note: Gyn-Onc  Consult was requested by Dr. Pamala Hurry for the evaluation of TYASHIA MORRISETTE 64 y.o. 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  CC:  Chief Complaint  Patient presents with  . pelvic mass    New consult  . postmenopausal bleeding    Assessment/Plan:  Ms. ROMEY COHEA  is a 64 y.o.  year old with morbid obesity, and a left sided pelvic mass abutting the vaginal apex and causing bleeding. 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  Given the slow increase in size of the lesion (from 2012 to 2016) there is a small concern for occult malignancy. It does not appear consistent with an ovarian neoplasm and she has a history of a BSO performed in 2001 (operative note and pathology records in Epic confirm BSO performed). She has no history of prior uterine or cervical cancer (or dysplasia) and is 25 years remote from her hysterectomy. But she does have a history of endometriosis with chronic bleeding from the vaginal cuff. If this is malignant, a primary resection would require a laparotomy, upper vaginectomy, rectosigmoid resection and possible ureteral reimplantation/partial cystectomy. In a patient with morbid obesity, such a surgery would be associated with a high risk for morbidity and potential for requiring an ostomy formation. An alternative treatment might be primary chemoradiation.  It is important to first establish the diagnosis of this mass. I believe it is amenable to biopsy. We will schedule her for a CT guided biopsy. If this is not technically possible, she could be considered for an examination under anesthesia and a trucut biopsy of the deeper pelvic tissues (the mucosa itself is grossly normal).   If endometriosis is confirmed, she should be considered to resume progestin therapy (which she had been on before).  The patient has requested that she not be told results over the phone,  but instead meet in person for the results.  HPI: [name] Sparr is a 64 year old G2P2 who is seen in consultation at the request of Dr. Pamala Hurry for a paravaginal mass in the setting of a prior hysterectomy for benign indications and morbid obesity and postmenopausal bleeding.   The patient reports a history of postmenopausal bleeding for several years. She has a remote history of a total abdominal hysterectomy and BSO at age 59 uterine fibroids. She denies a history of cervical dysplasia or uterine malignancy. She is no family history concerning for hereditary predisposition for malignancy. While the patient did not volunteer this information, review of her Epic record shows that she has carried a diagosis of chronic endometriosis of the vaginal cuff and bleeding since prior to 2001. In 2001 she underwent an exploratory laparotomy, complex hernia repair with mesh and BSO (benign pathology).   She developed resumption of the vaginal bleeding postop, though it appears that gynecologists have treated her with progestins in the past for this.  Imaging of the pelvis with ultrasound on 03/16/12 showed a pelvic 2.4x2x1cm in the left pelvis abutting the sigmoid and left vagina.   With respect to her symptoms of bleeding, she states this happens randomly and approximately once every 2-3 weeks. It is very light, and is mostly blood-tinged mucus that she passes. Its volume and character are similar in description to what had been reported in 2001. There is some question about whether there is blood in her stools however she feels that this may be more related to straining with constipation. She  has a long-standing history of constipation which has not been exacerbated lately. She denies pelvic pain.  Her last colonoscopy was 1 year ago.  As part of her workup for her persistent vaginal bleeding in 2016 she saw Dr. Pamala Hurry on 07/06/2015. Dr. Pamala Hurry did not find anything abnormal on physical examination but  on ultrasound of the pelvis on 07/12/2015 she identified a 4.1 x 3.1 x 3.7 cm complex mass at the left vaginal cuff with internal collar vascularity. She then underwent an MRI of the pelvis on 08/06/2015 this confirmed a surgically absent uterus and ovaries but a mass in the left posterior pelvis abutting the left superior margin of the vaginal cuff and the left internal iliac vessels measuring 3.2 x 4.0 cm with diffuse heterogeneous contrast enhancement. The small left internal and external iliac lymph nodes seen but none pathologically enlarged. There is no free fluid in the pelvis or evidence of distant metastatic disease.   Due to the increase in size of the mass since her 2012 imaging, she was referred to [facility] for further evaluation.   Current Meds:  Outpatient Encounter Prescriptions as of 08/20/2015  Medication Sig  . atorvastatin (LIPITOR) 10 MG tablet TAKE ONE TABLET BY MOUTH ONCE DAILY IN THE EVENING  . gabapentin (NEURONTIN) 300 MG capsule Take 300 mg by mouth 3 (three) times daily.  Marland Kitchen KLOR-CON M10 10 MEQ tablet TAKE ONE TABLET BY MOUTH ONCE DAILY  . labetalol (NORMODYNE) 100 MG tablet Take 100 mg by mouth 2 (two) times daily.  Marland Kitchen lisinopril (PRINIVIL,ZESTRIL) 10 MG tablet Take 10 mg by mouth at bedtime.   . torsemide (DEMADEX) 20 MG tablet Take 20 mg by mouth daily.  . traMADol (ULTRAM) 50 MG tablet Take 50 mg by mouth 2 (two) times daily.  . [DISCONTINUED] aspirin EC 325 MG tablet Take 1 tablet (325 mg total) by mouth 2 (two) times daily.  . [DISCONTINUED] HYDROcodone-acetaminophen (NORCO/VICODIN) 5-325 MG per tablet Take 1 tablet by mouth every 4 (four) hours as needed for moderate pain.  . [DISCONTINUED] indapamide (LOZOL) 1.25 MG tablet Take 1.25 mg by mouth daily.  . [DISCONTINUED] methocarbamol (ROBAXIN) 500 MG tablet 1 tablet q 6hr prn muscle spasm  . [DISCONTINUED] oxyCODONE-acetaminophen (ROXICET) 5-325 MG per tablet Take one tablet by mouth every 4 hours as needed for severe  pain; Take two tablets by mouth daily prior to therapy   No facility-administered encounter medications on file as of 08/20/2015.    Allergy:  Allergies  Allergen Reactions  . Sulfa Antibiotics Other (See Comments)    Unknown allergic reaction    Social Hx:   Social History   Social History  . Marital Status: Divorced    Spouse Name: N/A  . Number of Children: N/A  . Years of Education: N/A   Occupational History  . Not on file.   Social History Main Topics  . Smoking status: Never Smoker   . Smokeless tobacco: Never Used  . Alcohol Use: No  . Drug Use: No  . Sexual Activity: Not Currently   Other Topics Concern  . Not on file   Social History Narrative    Past Surgical Hx:  Past Surgical History  Procedure Laterality Date  . Cesarean section  1969, 1971  . Hernia repair  2001    incisional  . Multiple tooth extractions  2011  . Colonoscopy    . Total knee arthroplasty Left 07/17/2014    Procedure: TOTAL KNEE ARTHROPLASTY;  Surgeon: Kerin Salen,  MD;  Location: [place];  Service: Orthopedics;  Laterality: Left;  . Abdominal hysterectomy      and BSO    Past Medical Hx:  Past Medical History  Diagnosis Date  . Hypertension   . High cholesterol   . Arthritis   . Wears glasses   . Numbness     B/LLE  . Edema     B/LLE    Past Gynecological History:  Cesarean x 2. Hysterectomy age 64 for fibroids, no hx of cancer or dysplasia. Postmenopausal bleeding since age 19.  No LMP recorded. Patient is postmenopausal.  Family Hx:  Family History  Problem Relation Age of Onset  . Colon cancer Sister   . Cancer Sister     unsure of type of cancer    Review of Systems:  Constitutional  Feels well,    ENT Normal appearing ears and nares bilaterally Skin/Breast  No rash, sores, jaundice, itching, dryness Cardiovascular  No chest pain, shortness of breath, or edema  Pulmonary  No cough or wheeze.  Gastro Intestinal  No nausea, vomitting, or diarrhoea.  No bright red blood per rectum, no abdominal pain, change in bowel movement, or constipation.  Genito Urinary  No frequency, urgency, dysuria, see HPI Musculo Skeletal  No myalgia, arthralgia, joint swelling or pain  Neurologic  No weakness, numbness, change in gait,  Psychology  No depression, anxiety, insomnia.   Vitals:  Blood pressure 136/57, pulse 77, temperature 98.3 F (36.8 C), temperature source Oral, resp. rate 18, height 5\' 3"  (1.6 m), weight 238 lb 3.2 oz (108.047 kg), SpO2 93 %.  Physical Exam: WD in NAD Neck  Supple NROM, without any enlargements.  Lymph Node Survey No cervical supraclavicular or inguinal adenopathy Cardiovascular  Pulse normal rate, regularity and rhythm. S1 and S2 normal.  Lungs  Clear to auscultation bilateraly, without wheezes/crackles/rhonchi. Good air movement.  Skin  No rash/lesions/breakdown  Psychiatry  Alert and oriented to person, place, and time  Abdomen  Normoactive bowel sounds, abdomen soft, non-tender and very obese without a clear evidence of hernia but with a large pannus. There is a puckered midline abdominal incision. Back No CVA tenderness Genito Urinary  Vulva/vagina: Normal external female genitalia.  No lesions. No discharge.   Bladder/urethra:  No lesions or masses, well supported bladder  Vagina: scant bloody mucous in the vaginal vault. No visible lesions. On palpation, there is a firm fullness appreciated at the left vaginal apex. It is mobile, it is intimately attached to the colon.  Cervix: surgically absent  Uterus: surgically absent  Adnexa: no palpable masses. Rectal  Firm mass appreciated on left vaginal/rectovaginal septum. Extremities  No bilateral cyanosis, clubbing or edema.   Donaciano Eva, MD  08/20/2015, 11:56 AM

## 2015-08-21 ENCOUNTER — Telehealth: Payer: Self-pay | Admitting: *Deleted

## 2015-08-21 NOTE — Telephone Encounter (Signed)
Per patient request, pt scheduled to come in to the office to discuss CT biopsy results with Dr. Denman George.  Pt scheduled for 08/31/15 at [facility] to notify pt of appt - she is agreeable and will notify her son who will bring her to appt. Instructed patient to call our office if she needs to reschedule for any reason - patient appreciative of call and agreeable to this.

## 2015-08-22 ENCOUNTER — Other Ambulatory Visit: Payer: Self-pay | Admitting: Radiology

## 2015-08-24 ENCOUNTER — Ambulatory Visit: Payer: Commercial Managed Care - HMO | Admitting: Gynecologic Oncology

## 2015-08-24 ENCOUNTER — Ambulatory Visit (HOSPITAL_COMMUNITY)
Admission: RE | Admit: 2015-08-24 | Discharge: 2015-08-24 | Disposition: A | Discharge status: Home / Self Care | Payer: Commercial Managed Care - HMO | Source: Ambulatory Visit | Attending: Gynecologic Oncology | Admitting: Gynecologic Oncology

## 2015-08-24 DIAGNOSIS — N809 Endometriosis, unspecified: Secondary | ICD-10-CM | POA: Diagnosis not present

## 2015-08-24 DIAGNOSIS — Z882 Allergy status to sulfonamides status: Secondary | ICD-10-CM | POA: Diagnosis not present

## 2015-08-24 DIAGNOSIS — Z79899 Other long term (current) drug therapy: Secondary | ICD-10-CM | POA: Diagnosis not present

## 2015-08-24 DIAGNOSIS — N950 Postmenopausal bleeding: Secondary | ICD-10-CM

## 2015-08-24 DIAGNOSIS — R1900 Intra-abdominal and pelvic swelling, mass and lump, unspecified site: Secondary | ICD-10-CM | POA: Diagnosis present

## 2015-08-24 DIAGNOSIS — E780 Pure hypercholesterolemia: Secondary | ICD-10-CM | POA: Insufficient documentation

## 2015-08-24 DIAGNOSIS — Z90722 Acquired absence of ovaries, bilateral: Secondary | ICD-10-CM | POA: Insufficient documentation

## 2015-08-24 DIAGNOSIS — Z800 Family history of malignant neoplasm of digestive organs: Secondary | ICD-10-CM | POA: Insufficient documentation

## 2015-08-24 DIAGNOSIS — Z90710 Acquired absence of both cervix and uterus: Secondary | ICD-10-CM | POA: Insufficient documentation

## 2015-08-24 DIAGNOSIS — I10 Essential (primary) hypertension: Secondary | ICD-10-CM | POA: Insufficient documentation

## 2015-08-24 LAB — CBC
HCT: 37.4 % (ref 36.0–46.0)
Hemoglobin: 11.8 g/dL — ABNORMAL LOW (ref 12.0–15.0)
MCH: 31.7 pg (ref 26.0–34.0)
MCHC: 31.6 g/dL (ref 30.0–36.0)
MCV: 100.5 fL — ABNORMAL HIGH (ref 78.0–100.0)
Platelets: 119 10*3/uL — ABNORMAL LOW (ref 150–400)
RBC: 3.72 MIL/uL — ABNORMAL LOW (ref 3.87–5.11)
RDW: 13.6 % (ref 11.5–15.5)
WBC: 4 10*3/uL (ref 4.0–10.5)

## 2015-08-24 LAB — PROTIME-INR
INR: 1.11 (ref 0.00–1.49)
Prothrombin Time: 14.5 seconds (ref 11.6–15.2)

## 2015-08-24 LAB — APTT: aPTT: 33 seconds (ref 24–37)

## 2015-08-24 MED ORDER — HYDROCODONE-ACETAMINOPHEN 5-325 MG PO TABS: 1.0000 | ORAL_TABLET | ORAL | Status: DC | PRN

## 2015-08-24 MED ORDER — LIDOCAINE HCL 1 % IJ SOLN
INTRAMUSCULAR | Status: AC
  Filled 2015-08-24: qty 20

## 2015-08-24 MED ORDER — SODIUM CHLORIDE 0.9 % IV SOLN
INTRAVENOUS | Status: AC | PRN
  Administered 2015-08-24: 10 mL/h via INTRAVENOUS

## 2015-08-24 MED ORDER — FENTANYL CITRATE (PF) 100 MCG/2ML IJ SOLN
INTRAMUSCULAR | Status: AC
  Filled 2015-08-24: qty 4

## 2015-08-24 MED ORDER — MIDAZOLAM HCL 2 MG/2ML IJ SOLN
INTRAMUSCULAR | Status: AC
  Filled 2015-08-24: qty 4

## 2015-08-24 MED ORDER — SODIUM CHLORIDE 0.9 % IV SOLN: Freq: Once | INTRAVENOUS | Status: DC

## 2015-08-24 MED ORDER — FENTANYL CITRATE (PF) 100 MCG/2ML IJ SOLN
INTRAMUSCULAR | Status: AC | PRN
  Administered 2015-08-24: 50 ug via INTRAVENOUS

## 2015-08-24 MED ORDER — MIDAZOLAM HCL 2 MG/2ML IJ SOLN
INTRAMUSCULAR | Status: AC | PRN
  Administered 2015-08-24: 1 mg via INTRAVENOUS

## 2015-08-24 NOTE — Discharge Instructions (Signed)
Biopsy Care After These instructions give you information on caring for yourself after your procedure. Your doctor may also give you more specific instructions. Call your doctor if you have any problems or questions after your procedure. HOME CARE   Return to your normal diet and activities as told by your doctor.  Change your bandages (dressings) as told by your doctor. If skin glue (adhesive) was used, it will peel off in 7 days.  Only take medicines as told by your doctor.  Ask your doctor when you can bathe and get your wound wet. GET HELP RIGHT AWAY IF:  You see more than a small spot of blood coming from the wound.  You have redness, puffiness (swelling), or pain.  You see yellowish-white fluid (pus) coming from the wound.  You have a fever.  You notice a bad smell coming from the wound or bandage.  You have a rash, trouble breathing, or any allergy problems. MAKE SURE YOU:   Understand these instructions.  Will watch your condition.  Will get help right away if you are not doing well or get worse. Document Released: 07/15/2011 Document Revised: 02/02/2012 Document Reviewed: 07/15/2011 ExitCare Patient Information 2015 [employer]. This information is not intended to replace advice given to you by your health care provider. Make sure you discuss any questions you have with your health care provider.  

## 2015-08-24 NOTE — H&P (Signed)
Chief Complaint: Patient was seen in consultation today for CT guided biopsy of left posterior pelvic mass  Referring Physician(s): Rossi,Emma  History of Present Illness: Lauren Santana is a 64 y.o. female with history of postmenopausal bleeding for several years. She has a remote history of total abdominal hysterectomy for fibroids as well as chronic endometriosis of the vaginal cuff and bleeding since prior to 2001. In 2001 she underwent exploratory laparotomy, complex hernia repair with mesh and BSO with benign pathology. She developed resumption of vaginal bleeding postop and has been treated in the past with progestins. Recent MRI of the pelvis revealed a 4 cm enhancing soft tissue mass in the left posterior pelvis, abutting the vaginal cuff and left internal iliac vessels. Request has been made for CT-guided biopsy of this left posterior pelvic mass.  Past Medical History  Diagnosis Date  . Hypertension   . High cholesterol   . Arthritis   . Wears glasses   . Numbness     B/LLE  . Edema     B/LLE    Past Surgical History  Procedure Laterality Date  . Cesarean section  1969, 1971  . Hernia repair  2001    incisional  . Multiple tooth extractions  2011  . Colonoscopy    . Total knee arthroplasty Left 07/17/2014    Procedure: TOTAL KNEE ARTHROPLASTY;  Surgeon: Kerin Salen, MD;  Location: [facility];  Service: Orthopedics;  Laterality: Left;  . Abdominal hysterectomy      and BSO    Allergies: Sulfa antibiotics  Medications: Prior to Admission medications   Medication Sig Start Date End Date Taking? Authorizing Provider  atorvastatin (LIPITOR) 10 MG tablet TAKE ONE TABLET BY MOUTH ONCE DAILY IN THE EVENING Patient taking differently: Take 10 mg by mouth daily at 6 PM.  02/14/15  Yes Gildardo Cranker, DO  gabapentin (NEURONTIN) 300 MG capsule Take 300 mg by mouth 3 (three) times daily.   Yes Historical Provider, MD  KLOR-CON M10 10 MEQ tablet TAKE ONE TABLET BY MOUTH  ONCE DAILY Patient taking differently: TAKE 10 MEQ BY MOUTH ONCE DAILY 08/24/14  Yes Lauree Chandler, NP  labetalol (NORMODYNE) 100 MG tablet Take 100 mg by mouth 2 (two) times daily.   Yes Historical Provider, MD  lisinopril (PRINIVIL,ZESTRIL) 10 MG tablet Take 10 mg by mouth at bedtime.    Yes Historical Provider, MD  torsemide (DEMADEX) 20 MG tablet Take 20 mg by mouth daily.   Yes Historical Provider, MD  traMADol (ULTRAM) 50 MG tablet Take 50 mg by mouth 2 (two) times daily. 08/06/15  Yes Historical Provider, MD     Family History  Problem Relation Age of Onset  . Colon cancer Sister   . Cancer Sister     unsure of type of cancer    Social History   Social History  . Marital Status: Divorced    Spouse Name: N/A  . Number of Children: N/A  . Years of Education: N/A   Social History Main Topics  . Smoking status: Never Smoker   . Smokeless tobacco: Never Used  . Alcohol Use: No  . Drug Use: No  . Sexual Activity: Not Currently   Other Topics Concern  . Not on file   Social History Narrative      Review of Systems  Constitutional: Negative for fever and chills.  Respiratory: Negative for cough and shortness of breath.   Cardiovascular: Negative for chest pain.  Gastrointestinal:  Negative for nausea, vomiting, abdominal pain and blood in stool.  Genitourinary: Positive for vaginal bleeding. Negative for dysuria and hematuria.  Musculoskeletal: Negative for back pain.  Neurological: Negative for headaches.       Bilat LE paresthesias    Vital Signs: BP 132/55 mmHg  Pulse 75  Temp(Src) 98.3 F (36.8 C) (Oral)  Resp 16  Ht 5\' 3"  (1.6 m)  Wt 238 lb (107.956 kg)  BMI 42.17 kg/m2  SpO2 99%  Physical Exam  Constitutional: She is oriented to person, place, and time. She appears well-developed and well-nourished.  Cardiovascular: Normal rate and regular rhythm.   Pulmonary/Chest: Effort normal and breath sounds normal.  Abdominal: Soft. Bowel sounds are normal.  There is no tenderness.  obese  Neurological: She is alert and oriented to person, place, and time.    Mallampati Score:     Imaging: Mr Pelvis W Wo Contrast  08/06/2015   CLINICAL DATA:  Left pelvic mass seen on recent ultrasound. Prior hysterectomy and BSO. Endometriosis.  Creatinine were obtained on site at [facility] at  [address]  Results: Creatinine 1.0 mg/dL.  EXAM: MRI PELVIS WITHOUT AND WITH CONTRAST  TECHNIQUE: Multiplanar multisequence MR imaging of the pelvis was performed both before and after administration of intravenous contrast.  CONTRAST:  24mL MULTIHANCE GADOBENATE DIMEGLUMINE 529 MG/ML IV SOLN  COMPARISON:  Pelvic ultrasound on 07/12/2015  FINDINGS: Previous hysterectomy noted. There is a mass in the left posterior pelvis abutting the left superior margin of the vaginal cuff in the left internal iliac vessels. This mass measures 3.2 x 4.0 cm, and shows marked T2 hyperintensity with tiny cystic foci, as well as diffuse heterogeneous contrast enhancement.  Small left external iliac lymph nodes are seen, largest measuring 8 mm on image 3 of series 6. No pathologically enlarged lymph nodes or other masses are identified. No evidence of free fluid.  Sigmoid diverticulosis is noted, however there is no evidence of diverticulitis.  IMPRESSION: 4 cm enhancing soft tissue mass in the left posterior pelvis, abutting the vaginal cuff and left internal iliac vessels. Differential diagnosis includes both benign and malignant neoplasms, and endometriosis. Surgical evaluation should be considered.  Sub-cm left external iliac lymph nodes measuring up to 8 mm, which are nonspecific. No pathologically enlarged lymph nodes or ascites.   Electronically Signed   By: Earle Gell M.D.   On: 08/06/2015 13:22    Labs:  CBC: No results for input(s): WBC, HGB, HCT, PLT in the last 8760 hours.  COAGS: No results for input(s): INR, APTT in the last 8760 hours.  BMP: No results for  input(s): NA, K, CL, CO2, GLUCOSE, BUN, CALCIUM, CREATININE, GFRNONAA, GFRAA in the last 8760 hours.  Invalid input(s): CMP  LIVER FUNCTION TESTS: No results for input(s): BILITOT, AST, ALT, ALKPHOS, PROT, ALBUMIN in the last 8760 hours.  TUMOR MARKERS: No results for input(s): AFPTM, CEA, CA199, CHROMGRNA in the last 8760 hours.  Assessment and Plan: Lauren Santana is a 64 y.o. female with history of postmenopausal bleeding for several years. She has a remote history of total abdominal hysterectomy for fibroids as well as chronic endometriosis of the vaginal cuff and bleeding since prior to 2001. In 2001 she underwent exploratory laparotomy, complex hernia repair with mesh and BSO with benign pathology. She developed resumption of vaginal bleeding postop and has been treated in the past with progestins. Recent MRI of the pelvis revealed a 4 cm enhancing soft tissue mass in the left  posterior pelvis, abutting the vaginal cuff and left internal iliac vessels. Request has been made for CT-guided biopsy of this left posterior pelvic mass.Risks and benefits discussed with the patient/son including, but not limited to bleeding, infection, damage to adjacent structures or low yield requiring additional tests. All of the patient's questions were answered, patient is agreeable to proceed. Consent signed and in chart.     Thank you for this interesting consult.  I greatly enjoyed meeting Lauren Santana [facility] and look forward to participating in their care.  A copy of this report was sent to the requesting provider on this date.  Signed: D. Rowe Robert 08/24/2015, 8:25 AM   I spent a total of 15 minutes in face to face in clinical consultation, greater than 50% of which was counseling/coordinating care for CT-guided biopsy of left posterior pelvic mass

## 2015-08-24 NOTE — Sedation Documentation (Signed)
Patient denies pain and is resting comfortably.  

## 2015-08-24 NOTE — Sedation Documentation (Signed)
Patient is resting comfortably. 

## 2015-08-24 NOTE — Procedures (Signed)
CT core bx 18g x3 L pelvic mass No complication No blood loss. See complete dictation in [facility].

## 2015-08-31 ENCOUNTER — Encounter: Payer: Self-pay | Admitting: Gynecologic Oncology

## 2015-08-31 ENCOUNTER — Ambulatory Visit: Payer: Commercial Managed Care - HMO | Attending: Gynecologic Oncology | Admitting: Gynecologic Oncology

## 2015-08-31 VITALS — BP 155/58 | HR 79 | Temp 98.6°F | Resp 18 | Ht 63.0 in | Wt 241.6 lb

## 2015-08-31 DIAGNOSIS — Z6841 Body Mass Index (BMI) 40.0 and over, adult: Secondary | ICD-10-CM | POA: Diagnosis not present

## 2015-08-31 DIAGNOSIS — R1900 Intra-abdominal and pelvic swelling, mass and lump, unspecified site: Secondary | ICD-10-CM | POA: Diagnosis not present

## 2015-08-31 DIAGNOSIS — N803 Endometriosis of pelvic peritoneum: Secondary | ICD-10-CM | POA: Insufficient documentation

## 2015-08-31 DIAGNOSIS — IMO0002 Reserved for concepts with insufficient information to code with codable children: Secondary | ICD-10-CM

## 2015-08-31 NOTE — Progress Notes (Signed)
Followup Note: Gyn-Onc  Consult was requested by Dr. Pamala Hurry for the evaluation of Lauren Santana 64 y.o. female with a 4cm area of endometriosis at the left apex of the vagina.  CC:  Chief Complaint  Patient presents with  . Pelvic Mass    Follow up MD appointment    Assessment/Plan:  Lauren Santana  is a 64 y.o.  year old with morbid obesity, and a left sided pelvic mass abutting the vaginal apex and causing bleeding. 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  It is minimally symptomatic (light occasional bleeding) that has been present for several decades. It has demonstrated very subtle growth over many years.  She should not have surgical resection of this mass due to her poor general health, morbid obesity (BMI 42kg/m2) and prior complex surgeries as the risks outweigh any benefits.  She has had treatment with medical therapies (progestins) in the past. I discussed with Vaughan Basta that I don't medically manage endometriosis. I am sending her back to see Dr Valentino Saxon who can consider her for medical therapy if deemed necessary based on symptom management.  HPI: Lauren Santana is a 64 year old G2P2 who is seen in consultation at the request of Dr. Pamala Hurry for a paravaginal mass in the setting of a prior hysterectomy for benign indications and morbid obesity and postmenopausal bleeding.   The patient reports a history of postmenopausal bleeding for several years. She has a remote history of a total abdominal hysterectomy and BSO at age 20 uterine fibroids. She denies a history of cervical dysplasia or uterine malignancy. She is no family history concerning for hereditary predisposition for malignancy. While the patient did not volunteer this information, review of her Epic record shows that she has carried a diagosis of chronic endometriosis of the vaginal cuff and bleeding since prior to 2001. In 2001 she underwent an exploratory laparotomy, complex hernia  repair with mesh and BSO (benign pathology).   She developed resumption of the vaginal bleeding postop, though it appears that gynecologists have treated her with progestins in the past for this.  Imaging of the pelvis with ultrasound on 03/16/12 showed a pelvic 2.4x2x1cm in the left pelvis abutting the sigmoid and left vagina.   With respect to her symptoms of bleeding, she states this happens randomly and approximately once every 2-3 weeks. It is very light, and is mostly blood-tinged mucus that she passes. Its volume and character are similar in description to what had been reported in 2001. There is some question about whether there is blood in her stools however she feels that this may be more related to straining with constipation. She has a long-standing history of constipation which has not been exacerbated lately. She denies pelvic pain.  Her last colonoscopy was 1 year ago.  As part of her workup for her persistent vaginal bleeding in 2016 she saw Dr. Pamala Hurry on 07/06/2015. Dr. Pamala Hurry did not find anything abnormal on physical examination but on ultrasound of the pelvis on 07/12/2015 she identified a 4.1 x 3.1 x 3.7 cm complex mass at the left vaginal cuff with internal collar vascularity. She then underwent an MRI of the pelvis on 08/06/2015 this confirmed a surgically absent uterus and ovaries but a mass in the left posterior pelvis abutting the left superior margin of the vaginal cuff and the left internal iliac vessels measuring 3.2 x 4.0 cm with diffuse heterogeneous contrast enhancement. The small left internal and external iliac lymph nodes seen but none pathologically enlarged.  There is no free fluid in the pelvis or evidence of distant metastatic disease.   Due to the increase in size of the mass since her 2012 imaging, she was referred to [place] for further evaluation.  On 08/24/15 she underwent CT guided biopsy of the left pelvic mass. Final pathology revealed  endometrioisis.   Current Meds:  Outpatient Encounter Prescriptions as of 08/31/2015  Medication Sig  . atorvastatin (LIPITOR) 10 MG tablet TAKE ONE TABLET BY MOUTH ONCE DAILY IN THE EVENING (Patient taking differently: Take 10 mg by mouth daily at 6 PM. )  . gabapentin (NEURONTIN) 300 MG capsule Take 300 mg by mouth 3 (three) times daily.  Marland Kitchen KLOR-CON M10 10 MEQ tablet TAKE ONE TABLET BY MOUTH ONCE DAILY (Patient taking differently: TAKE 10 MEQ BY MOUTH ONCE DAILY)  . labetalol (NORMODYNE) 100 MG tablet Take 100 mg by mouth 2 (two) times daily.  Marland Kitchen lisinopril (PRINIVIL,ZESTRIL) 10 MG tablet Take 10 mg by mouth at bedtime.   . torsemide (DEMADEX) 20 MG tablet Take 20 mg by mouth daily.  . traMADol (ULTRAM) 50 MG tablet Take 50 mg by mouth 2 (two) times daily.   No facility-administered encounter medications on file as of 08/31/2015.    Allergy:  Allergies  Allergen Reactions  . Tylenol [Acetaminophen] Other (See Comments)    States "seems like eats my stomach"  . Sulfa Antibiotics Other (See Comments)    Unknown allergic reaction    Social Hx:   Social History   Social History  . Marital Status: Divorced    Spouse Name: N/A  . Number of Children: N/A  . Years of Education: N/A   Occupational History  . Not on file.   Social History Main Topics  . Smoking status: Never Smoker   . Smokeless tobacco: Never Used  . Alcohol Use: No  . Drug Use: No  . Sexual Activity: Not Currently   Other Topics Concern  . Not on file   Social History Narrative    Past Surgical Hx:  Past Surgical History  Procedure Laterality Date  . Cesarean section  1969, 1971  . Hernia repair  2001    incisional  . Multiple tooth extractions  2011  . Colonoscopy    . Total knee arthroplasty Left 07/17/2014    Procedure: TOTAL KNEE ARTHROPLASTY;  Surgeon: Kerin Salen, MD;  Location: [place];  Service: Orthopedics;  Laterality: Left;  . Abdominal hysterectomy      and BSO    Past Medical Hx:   Past Medical History  Diagnosis Date  . Hypertension   . High cholesterol   . Arthritis   . Wears glasses   . Numbness     B/LLE  . Edema     B/LLE    Past Gynecological History:  Cesarean x 2. Hysterectomy age 55 for fibroids, no hx of cancer or dysplasia. Postmenopausal bleeding since age 32.  No LMP recorded. Patient is postmenopausal.  Family Hx:  Family History  Problem Relation Age of Onset  . Colon cancer Sister   . Cancer Sister     unsure of type of cancer    Review of Systems:  Constitutional  Feels well,    ENT Normal appearing ears and nares bilaterally Skin/Breast  No rash, sores, jaundice, itching, dryness Cardiovascular  No chest pain, shortness of breath, or edema  Pulmonary  No cough or wheeze.  Gastro Intestinal  No nausea, vomitting, or diarrhoea. No bright red blood per  rectum, no abdominal pain, change in bowel movement, or constipation.  Genito Urinary  No frequency, urgency, dysuria, see HPI Musculo Skeletal  No myalgia, arthralgia, joint swelling or pain  Neurologic  No weakness, numbness, change in gait,  Psychology  No depression, anxiety, insomnia.   Vitals:  Blood pressure 155/58, pulse 79, temperature 98.6 F (37 C), temperature source Oral, resp. rate 18, height 5\' 3"  (1.6 m), weight 241 lb 9.6 oz (109.589 kg), SpO2 99 %.  Physical Exam: deferred   Donaciano Eva, MD  08/31/2015, 12:11 PM

## 2016-02-11 DIAGNOSIS — F064 Anxiety disorder due to known physiological condition: Secondary | ICD-10-CM | POA: Diagnosis not present

## 2016-02-11 DIAGNOSIS — I10 Essential (primary) hypertension: Secondary | ICD-10-CM | POA: Diagnosis not present

## 2016-02-11 DIAGNOSIS — N803 Endometriosis of pelvic peritoneum: Secondary | ICD-10-CM | POA: Diagnosis not present

## 2016-02-11 DIAGNOSIS — E669 Obesity, unspecified: Secondary | ICD-10-CM | POA: Diagnosis not present

## 2016-02-11 DIAGNOSIS — R069 Unspecified abnormalities of breathing: Secondary | ICD-10-CM | POA: Diagnosis not present

## 2016-02-11 DIAGNOSIS — Z68.41 Body mass index (BMI) pediatric, greater than or equal to 95th percentile for age: Secondary | ICD-10-CM | POA: Diagnosis not present

## 2016-02-11 DIAGNOSIS — R05 Cough: Secondary | ICD-10-CM | POA: Diagnosis not present

## 2016-02-12 ENCOUNTER — Encounter (HOSPITAL_COMMUNITY): Payer: Self-pay | Admitting: Emergency Medicine

## 2016-02-12 ENCOUNTER — Emergency Department (HOSPITAL_COMMUNITY): Payer: PPO

## 2016-02-12 ENCOUNTER — Inpatient Hospital Stay (HOSPITAL_COMMUNITY)
Admission: EM | Admit: 2016-02-12 | Discharge: 2016-02-16 | DRG: 175 | Disposition: A | Discharge status: Home Health | Payer: PPO | Attending: Internal Medicine | Admitting: Internal Medicine

## 2016-02-12 ENCOUNTER — Emergency Department (HOSPITAL_COMMUNITY)
Admission: EM | Admit: 2016-02-12 | Discharge: 2016-02-12 | Payer: PPO | Source: Home / Self Care | Attending: Family Medicine | Admitting: Family Medicine

## 2016-02-12 ENCOUNTER — Other Ambulatory Visit: Payer: Self-pay

## 2016-02-12 DIAGNOSIS — I471 Supraventricular tachycardia: Secondary | ICD-10-CM | POA: Diagnosis not present

## 2016-02-12 DIAGNOSIS — E785 Hyperlipidemia, unspecified: Secondary | ICD-10-CM | POA: Diagnosis not present

## 2016-02-12 DIAGNOSIS — I2609 Other pulmonary embolism with acute cor pulmonale: Secondary | ICD-10-CM | POA: Diagnosis not present

## 2016-02-12 DIAGNOSIS — I82431 Acute embolism and thrombosis of right popliteal vein: Secondary | ICD-10-CM | POA: Diagnosis not present

## 2016-02-12 DIAGNOSIS — R600 Localized edema: Secondary | ICD-10-CM

## 2016-02-12 DIAGNOSIS — Z79899 Other long term (current) drug therapy: Secondary | ICD-10-CM

## 2016-02-12 DIAGNOSIS — N803 Endometriosis of pelvic peritoneum: Secondary | ICD-10-CM | POA: Diagnosis not present

## 2016-02-12 DIAGNOSIS — I10 Essential (primary) hypertension: Secondary | ICD-10-CM | POA: Diagnosis not present

## 2016-02-12 DIAGNOSIS — R0689 Other abnormalities of breathing: Secondary | ICD-10-CM

## 2016-02-12 DIAGNOSIS — Z6841 Body Mass Index (BMI) 40.0 and over, adult: Secondary | ICD-10-CM

## 2016-02-12 DIAGNOSIS — I82411 Acute embolism and thrombosis of right femoral vein: Secondary | ICD-10-CM | POA: Diagnosis present

## 2016-02-12 DIAGNOSIS — R0989 Other specified symptoms and signs involving the circulatory and respiratory systems: Secondary | ICD-10-CM

## 2016-02-12 DIAGNOSIS — N179 Acute kidney failure, unspecified: Secondary | ICD-10-CM | POA: Diagnosis not present

## 2016-02-12 DIAGNOSIS — M79661 Pain in right lower leg: Secondary | ICD-10-CM

## 2016-02-12 DIAGNOSIS — I872 Venous insufficiency (chronic) (peripheral): Secondary | ICD-10-CM | POA: Diagnosis present

## 2016-02-12 DIAGNOSIS — D696 Thrombocytopenia, unspecified: Secondary | ICD-10-CM | POA: Diagnosis not present

## 2016-02-12 DIAGNOSIS — R000 Tachycardia, unspecified: Secondary | ICD-10-CM

## 2016-02-12 DIAGNOSIS — Z96652 Presence of left artificial knee joint: Secondary | ICD-10-CM | POA: Diagnosis not present

## 2016-02-12 DIAGNOSIS — I2699 Other pulmonary embolism without acute cor pulmonale: Secondary | ICD-10-CM | POA: Diagnosis present

## 2016-02-12 DIAGNOSIS — E860 Dehydration: Secondary | ICD-10-CM | POA: Diagnosis not present

## 2016-02-12 DIAGNOSIS — E669 Obesity, unspecified: Secondary | ICD-10-CM

## 2016-02-12 DIAGNOSIS — R0609 Other forms of dyspnea: Secondary | ICD-10-CM | POA: Diagnosis not present

## 2016-02-12 DIAGNOSIS — R0602 Shortness of breath: Secondary | ICD-10-CM | POA: Diagnosis not present

## 2016-02-12 DIAGNOSIS — R609 Edema, unspecified: Secondary | ICD-10-CM | POA: Diagnosis not present

## 2016-02-12 DIAGNOSIS — I82401 Acute embolism and thrombosis of unspecified deep veins of right lower extremity: Secondary | ICD-10-CM | POA: Diagnosis present

## 2016-02-12 DIAGNOSIS — R1900 Intra-abdominal and pelvic swelling, mass and lump, unspecified site: Secondary | ICD-10-CM | POA: Diagnosis present

## 2016-02-12 DIAGNOSIS — IMO0002 Reserved for concepts with insufficient information to code with codable children: Secondary | ICD-10-CM | POA: Diagnosis present

## 2016-02-12 DIAGNOSIS — R0600 Dyspnea, unspecified: Secondary | ICD-10-CM

## 2016-02-12 LAB — CBC WITH DIFFERENTIAL/PLATELET
Basophils Absolute: 0 10*3/uL (ref 0.0–0.1)
Basophils Relative: 0 %
Eosinophils Absolute: 0.2 10*3/uL (ref 0.0–0.7)
Eosinophils Relative: 3 %
HCT: 36 % (ref 36.0–46.0)
Hemoglobin: 11.7 g/dL — ABNORMAL LOW (ref 12.0–15.0)
Lymphocytes Relative: 27 %
Lymphs Abs: 2 10*3/uL (ref 0.7–4.0)
MCH: 31.8 pg (ref 26.0–34.0)
MCHC: 32.5 g/dL (ref 30.0–36.0)
MCV: 97.8 fL (ref 78.0–100.0)
Monocytes Absolute: 0.9 10*3/uL (ref 0.1–1.0)
Monocytes Relative: 12 %
Neutro Abs: 4.3 10*3/uL (ref 1.7–7.7)
Neutrophils Relative %: 58 %
Platelets: 87 10*3/uL — ABNORMAL LOW (ref 150–400)
RBC: 3.68 MIL/uL — ABNORMAL LOW (ref 3.87–5.11)
RDW: 14 % (ref 11.5–15.5)
WBC: 7.4 10*3/uL (ref 4.0–10.5)

## 2016-02-12 LAB — BASIC METABOLIC PANEL
Anion gap: 11 (ref 5–15)
BUN: 18 mg/dL (ref 6–20)
CO2: 25 mmol/L (ref 22–32)
Calcium: 9.5 mg/dL (ref 8.9–10.3)
Chloride: 103 mmol/L (ref 101–111)
Creatinine, Ser: 1.18 mg/dL — ABNORMAL HIGH (ref 0.44–1.00)
GFR calc Af Amer: 54 mL/min — ABNORMAL LOW (ref >60)
GFR calc non Af Amer: 47 mL/min — ABNORMAL LOW (ref >60)
Glucose, Bld: 104 mg/dL — ABNORMAL HIGH (ref 65–99)
Potassium: 4 mmol/L (ref 3.5–5.1)
Sodium: 139 mmol/L (ref 135–145)

## 2016-02-12 LAB — I-STAT CHEM 8, ED
BUN: 19 mg/dL (ref 6–20)
Calcium, Ion: 1.18 mmol/L (ref 1.13–1.30)
Chloride: 101 mmol/L (ref 101–111)
Creatinine, Ser: 1.1 mg/dL — ABNORMAL HIGH (ref 0.44–1.00)
Glucose, Bld: 96 mg/dL (ref 65–99)
HCT: 37 % (ref 36.0–46.0)
Hemoglobin: 12.6 g/dL (ref 12.0–15.0)
Potassium: 3.8 mmol/L (ref 3.5–5.1)
Sodium: 138 mmol/L (ref 135–145)
TCO2: 25 mmol/L (ref 0–100)

## 2016-02-12 LAB — D-DIMER, QUANTITATIVE: D-Dimer, Quant: >20 ug/mL-FEU — ABNORMAL HIGH (ref 0.00–0.50)

## 2016-02-12 LAB — I-STAT TROPONIN, ED: Troponin i, poc: 0.04 ng/mL (ref 0.00–0.08)

## 2016-02-12 MED ORDER — SODIUM CHLORIDE 0.9 % IV SOLN
Freq: Once | INTRAVENOUS | Status: AC
  Administered 2016-02-12: 19:00:00 via INTRAVENOUS

## 2016-02-12 MED ORDER — HEPARIN BOLUS VIA INFUSION
5000.0000 [IU] | Freq: Once | INTRAVENOUS | Status: AC
  Administered 2016-02-13: 5000 [IU] via INTRAVENOUS
  Filled 2016-02-12: qty 5000

## 2016-02-12 MED ORDER — FUROSEMIDE 20 MG PO TABS: 20.0000 mg | ORAL_TABLET | Freq: Every day | ORAL | Status: DC

## 2016-02-12 MED ORDER — HEPARIN (PORCINE) IN NACL 100-0.45 UNIT/ML-% IJ SOLN
1450.0000 [IU]/h | INTRAMUSCULAR | Status: DC
  Administered 2016-02-13 (×2): 1300 [IU]/h via INTRAVENOUS
  Filled 2016-02-12 (×2): qty 250

## 2016-02-12 MED ORDER — IOHEXOL 350 MG/ML SOLN
100.0000 mL | Freq: Once | INTRAVENOUS | Status: AC | PRN
  Administered 2016-02-12: 100 mL via INTRAVENOUS

## 2016-02-12 NOTE — ED Notes (Signed)
[facility] notified 

## 2016-02-12 NOTE — ED Notes (Signed)
Here with increased sob with activity and right leg swelling/ pain over the weekend States, noticed progressive sob with spring cleaning and now weakness, needing to use walker more Taking Furosemide as needed Slight chest pain noted as well Pt saw PCP yesterday and never mentioned chest pain or sob

## 2016-02-12 NOTE — ED Notes (Signed)
[facility] - Patient coming from [facility] with bilateral leg swelling and tenderness worsening since last week.  Lungs clear, positive bowel sounds per [facility].

## 2016-02-12 NOTE — ED Provider Notes (Addendum)
CSN: [id number]     Arrival date & time 02/12/16  1955 History   First MD Initiated Contact with Patient 02/12/16 2009     Chief Complaint  Patient presents with  . Leg Swelling    Bilateral      HPI  She presents for evaluation of leg pain and swelling as well as shortness of breath.  Each of symptoms to about 7-10 days ago. She walked without a cane that she normally walks with because of a left total knee arthroplasty done several years ago. Had some discomfort in her right leg is persisted since that time. Always has some peripheral edema. Noticing increased discomfort and swelling in the right lower leg. Has noticed more shortness of breath with activities of daily living the point she is limiting her self at home. Normally takes Lasix for her lower extremity edema.  History of dysfunction uterine bleeding and endometriosis requiring TAH with BSO. Had recurrence several years ago of intermittent vaginal bleeding. 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 This occasionally will give her vaginal bleeding. None over the last few days. Seen by her GYN doctor [facility] yesterday and given Lupron. This is her first dose of Lupron. Is not on any additional hormone therapy.  Past Medical History  Diagnosis Date  . Hypertension   . High cholesterol   . Arthritis   . Wears glasses   . Numbness     B/LLE  . Edema     B/LLE   Past Surgical History  Procedure Laterality Date  . Cesarean section  1969, 1971  . Hernia repair  2001    incisional  . Multiple tooth extractions  2011  . Colonoscopy    . Total knee arthroplasty Left 07/17/2014    Procedure: TOTAL KNEE ARTHROPLASTY;  Surgeon: Kerin Salen, MD;  Location: [place];  Service: Orthopedics;  Laterality: Left;  . Abdominal hysterectomy      and BSO   Family History  Problem Relation Age of Onset  . Colon cancer Sister   . Cancer Sister     unsure of type of cancer    Social History  Substance Use Topics  . Smoking status: Never Smoker   . Smokeless tobacco: Never Used  . Alcohol Use: No   OB History    Gravida Para Term Preterm AB TAB SAB Ectopic Multiple Living   4 3 3  1  1   2      Review of Systems  Constitutional: Negative for fever, chills, diaphoresis, appetite change and fatigue.  HENT: Negative for mouth sores, sore throat and trouble swallowing.   Eyes: Negative for visual disturbance.  Respiratory: Positive for shortness of breath. Negative for cough, chest tightness and wheezing.   Cardiovascular: Positive for leg swelling. Negative for chest pain.  Gastrointestinal: Negative for nausea, vomiting, abdominal pain, diarrhea and abdominal distention.  Endocrine: Negative for polydipsia, polyphagia and polyuria.  Genitourinary: Negative for dysuria, frequency and hematuria.  Musculoskeletal: Negative for gait problem.  Skin: Negative for color change, pallor and rash.  Neurological: Negative for dizziness, syncope, light-headedness and headaches.  Hematological: Does not bruise/bleed easily.  Psychiatric/Behavioral: Negative for behavioral problems and confusion.      Allergies  Tylenol and Sulfa antibiotics  Home Medications   Prior to Admission medications   Medication Sig Start Date End Date Taking? Authorizing Provider  atorvastatin (LIPITOR) 10 MG tablet TAKE ONE TABLET BY MOUTH ONCE DAILY IN THE EVENING Patient  taking differently: Take 10 mg by mouth daily at 6 PM.  02/14/15   Gildardo Cranker, DO  gabapentin (NEURONTIN) 300 MG capsule Take 300 mg by mouth 3 (three) times daily.    Historical Provider, MD  KLOR-CON M10 10 MEQ tablet TAKE ONE TABLET BY MOUTH ONCE DAILY Patient taking differently: TAKE 10 MEQ BY MOUTH ONCE DAILY 08/24/14   Lauree Chandler, NP  labetalol (NORMODYNE) 100 MG tablet Take 100 mg by mouth 2 (two) times daily.    Historical Provider, MD  lisinopril (PRINIVIL,ZESTRIL) 10 MG tablet Take 10 mg by mouth  at bedtime.     Historical Provider, MD  torsemide (DEMADEX) 20 MG tablet Take 20 mg by mouth daily.    Historical Provider, MD  traMADol (ULTRAM) 50 MG tablet Take 50 mg by mouth 2 (two) times daily. 08/06/15   Historical Provider, MD   BP 109/91 mmHg  Pulse 106  Resp 26  SpO2 96% Physical Exam  Constitutional: She is oriented to person, place, and time. She appears well-developed and well-nourished. No distress.  HENT:  Head: Normocephalic.  Eyes: Conjunctivae are normal. Pupils are equal, round, and reactive to light. No scleral icterus.  Neck: Normal range of motion. Neck supple. No thyromegaly present.  Cardiovascular: Normal rate and regular rhythm.  Exam reveals no gallop and no friction rub.   No murmur heard. Pulmonary/Chest: Effort normal and breath sounds normal. No respiratory distress. She has no wheezes. She has no rales.  Slight dyspnea with conversation. Heart rate 108 at rest  Abdominal: Soft. Bowel sounds are normal. She exhibits no distension. There is no tenderness. There is no rebound.  Musculoskeletal: Normal range of motion.  Neurological: She is alert and oriented to person, place, and time.  Skin: Skin is warm and dry. No rash noted.  Increased circumference and tenderness posterior within the right lower leg.  Psychiatric: She has a normal mood and affect. Her behavior is normal.    ED Course  Procedures (including critical care time) Labs Review Labs Reviewed  CBC WITH DIFFERENTIAL/PLATELET - Abnormal; Notable for the following:    RBC 3.68 (*)    Hemoglobin 11.7 (*)    Platelets 87 (*)    All other components within normal limits  BASIC METABOLIC PANEL - Abnormal; Notable for the following:    Glucose, Bld 104 (*)    Creatinine, Ser 1.18 (*)    GFR calc non Af Amer 47 (*)    GFR calc Af Amer 54 (*)    All other components within normal limits  D-DIMER, QUANTITATIVE (NOT AT [facility]) - Abnormal; Notable for the following:    D-Dimer, Quant >20.00 (*)     All other components within normal limits  I-STAT CHEM 8, ED - Abnormal; Notable for the following:    Creatinine, Ser 1.10 (*)    All other components within normal limits  I-STAT TROPOININ, ED  POC OCCULT BLOOD, ED    Imaging Review Ct Angio Chest Pe W/cm &/or Wo Cm  02/12/2016  CLINICAL DATA:  Shortness of breath, right leg swelling, and tachycardia for 2 days. EXAM: CT ANGIOGRAPHY CHEST WITH CONTRAST TECHNIQUE: Multidetector CT imaging of the chest was performed using the standard protocol during bolus administration of intravenous contrast. Multiplanar CT image reconstructions and MIPs were obtained to evaluate the vascular anatomy. CONTRAST:  171mL OMNIPAQUE IOHEXOL 350 MG/ML SOLN COMPARISON:  None. FINDINGS: Technically adequate study with good opacification of the central and segmental pulmonary arteries. Filling defects are  demonstrated in the distal main pulmonary arteries bilaterally, extending into multiple bilateral upper and lower lobe branches consistent with acute pulmonary embolus. RV to LV ratio is about 2, suggesting possible right heart strain. Normal heart size. Normal caliber thoracic aorta. No aortic dissection. Great vessel origins are patent. Scattered lymph nodes in the mediastinum are not pathologically enlarged are likely reactive. Small esophageal hiatal hernia. Esophagus is decompressed. Evaluation of lungs is limited due to respiratory motion artifact. Atelectasis is demonstrated in the lung bases. No focal airspace disease or consolidation. No pleural effusions. No pneumothorax. Included portions of the upper abdominal organs demonstrate a cyst in the left kidney. Degenerative changes in the spine. No destructive bone lesions. Review of the MIP images confirms the above findings. IMPRESSION: Bilateral central and segmental pulmonary emboli with increased RV to LV ratio suggesting right heart strain. These results were called by telephone at the time of interpretation on  02/12/2016 at 11:35 pm to Dr. Tanna Furry , who verbally acknowledged these results. Electronically Signed   By: Lucienne Capers M.D.   On: 02/12/2016 23:36   I have personally reviewed and evaluated these images and lab results as part of my medical decision-making.   EKG Interpretation None      MDM   Final diagnoses:  Other acute pulmonary embolism with acute cor pulmonale (HCC)    CT c Sub-massive/non-saddle PE to Bilateral PAs and segmental arteries.  Increased RV to LV ratio.  Rectal exam no gross blood. (Pt states intermittent BRB c BM, not today).  Heparin bolus and qtt ordered.  Will speak with Pulm and triad re: admit.  She remains hemolytically stable. She has no O2 requirement. Resting heart rate 103. BP is 120. Trop 0.04.     Per my discussion with ICU/Pulmonary no indications for acute lytic therapy or ICU admit or consult unless clinical scenario worsens, or Echo concerning.  CRITICAL CARE Performed by: Tanna Furry JOSEPH   Total critical care time: 30 minutes  Critical care time was exclusive of separately billable procedures and treating other patients.  Critical care was necessary to treat or prevent imminent or life-threatening deterioration.  Critical care was time spent personally by me on the following activities: development of treatment plan with patient and/or surrogate as well as nursing, discussions with consultants, evaluation of patient's response to treatment, examination of patient, obtaining history from patient or surrogate, ordering and performing treatments and interventions, ordering and review of laboratory studies, ordering and review of radiographic studies, pulse oximetry and re-evaluation of patient's condition. Care     Tanna Furry, MD 02/12/16 2358  Tanna Furry, MD 02/13/16 [id number]  Tanna Furry, MD 02/13/16 [phone]

## 2016-02-12 NOTE — ED Provider Notes (Signed)
CSN: [id number]     Arrival date & time 02/12/16  1520 History   First MD Initiated Contact with Patient 02/12/16 1823     Chief Complaint  Patient presents with  . Shortness of Breath  . Leg Swelling  . Chest Pain   (Consider location/radiation/quality/duration/timing/severity/associated sxs/prior Treatment) HPI Comments: 65 year old morbidly obese female with history of hypertension, dyslipidemia, peripheral edema states that in the past 4-5 days she has experienced dyspnea on exertion. This is new for her she is usually able to ambulate or perform like task without shortness of breath. Now she has limited in her activity. She is also complaining of pain and additional swelling to the right lower extremity and pain in the right calf. Again she has chronic peripheral edema however the increase in swelling and pain in the right lower extremity as needed. This occurred just prior to the onset of the dyspnea on exertion. Denies chest pain or discomfort. Denies heaviness, fullness, pressure, squeezing, tightness or other discomfort. She does feel drainage in the back of her throat that goes down the middle of her chest. She noted that she has not taken her diuretics in the past 2 days. Current medications include labetalol and torsemide.   Past Medical History  Diagnosis Date  . Hypertension   . High cholesterol   . Arthritis   . Wears glasses   . Numbness     B/LLE  . Edema     B/LLE   Past Surgical History  Procedure Laterality Date  . Cesarean section  1969, 1971  . Hernia repair  2001    incisional  . Multiple tooth extractions  2011  . Colonoscopy    . Total knee arthroplasty Left 07/17/2014    Procedure: TOTAL KNEE ARTHROPLASTY;  Surgeon: Kerin Salen, MD;  Location: [place];  Service: Orthopedics;  Laterality: Left;  . Abdominal hysterectomy      and BSO   Family History  Problem Relation Age of Onset  . Colon cancer Sister   . Cancer Sister     unsure of type of cancer    Social History  Substance Use Topics  . Smoking status: Never Smoker   . Smokeless tobacco: Never Used  . Alcohol Use: No   OB History    Gravida Para Term Preterm AB TAB SAB Ectopic Multiple Living   4 3 3  1  1   2      Review of Systems  Constitutional: Positive for activity change and fatigue. Negative for fever.  HENT: Positive for postnasal drip. Negative for congestion.   Respiratory: Positive for shortness of breath. Negative for choking and chest tightness.   Cardiovascular: Positive for leg swelling. Negative for chest pain and palpitations.  Gastrointestinal: Negative.   Genitourinary: Negative.   Musculoskeletal: Negative.   Skin: Negative.   Neurological: Negative for dizziness, syncope and speech difficulty.  Psychiatric/Behavioral: The patient is nervous/anxious.   All other systems reviewed and are negative.   Allergies  Tylenol and Sulfa antibiotics  Home Medications   Prior to Admission medications   Medication Sig Start Date End Date Taking? Authorizing Provider  atorvastatin (LIPITOR) 10 MG tablet TAKE ONE TABLET BY MOUTH ONCE DAILY IN THE EVENING Patient taking differently: Take 10 mg by mouth daily at 6 PM.  02/14/15   Gildardo Cranker, DO  gabapentin (NEURONTIN) 300 MG capsule Take 300 mg by mouth 3 (three) times daily.    Historical Provider, MD  KLOR-CON M10 10 MEQ tablet TAKE  ONE TABLET BY MOUTH ONCE DAILY Patient taking differently: TAKE 10 MEQ BY MOUTH ONCE DAILY 08/24/14   Lauree Chandler, NP  labetalol (NORMODYNE) 100 MG tablet Take 100 mg by mouth 2 (two) times daily.    Historical Provider, MD  lisinopril (PRINIVIL,ZESTRIL) 10 MG tablet Take 10 mg by mouth at bedtime.     Historical Provider, MD  torsemide (DEMADEX) 20 MG tablet Take 20 mg by mouth daily.    Historical Provider, MD  traMADol (ULTRAM) 50 MG tablet Take 50 mg by mouth 2 (two) times daily. 08/06/15   Historical Provider, MD   Meds Ordered and Administered this Visit    Medications  0.9 %  sodium chloride infusion (not administered)    BP 102/67 mmHg  Pulse 96  Temp(Src) 97.4 F (36.3 C) (Oral)  SpO2 96% No data found.   Physical Exam  Constitutional: She is oriented to person, place, and time. She appears well-developed. No distress.  Eyes: Conjunctivae and EOM are normal.  Neck: Normal range of motion. Neck supple.  Cardiovascular: Regular rhythm and normal heart sounds.   Pulses:      Radial pulses are 2+ on the right side, and 2+ on the left side.  Apical pulse rate 104  Pulmonary/Chest: Effort normal. No respiratory distress. She has no wheezes. She has no rales.  Breath sounds are diminished in all fields even when patient is taking deep breaths to the mouth. No wheezing or crackles.  Musculoskeletal: She exhibits edema.  Bilateral lower extremity edema. 1+ pitting. Right lower extremity with greater  edema as well has marked tenderness to the posterior and medial calf.  Neurological: She is alert and oriented to person, place, and time. No cranial nerve deficit. She exhibits normal muscle tone.  Skin: Skin is warm and dry.  Psychiatric: She has a normal mood and affect. Her behavior is normal.  Nursing note and vitals reviewed.   ED Course  Procedures (including critical care time)  Labs Review Labs Reviewed - No data to display  Imaging Review No results found. ED ECG REPORT   Date: 02/12/2016  Rate: 95  Rhythm: normal sinus rhythm  QRS Axis: normal  Intervals: normal  ST/T Wave abnormalities: New T wave inversions III, aVF, as compared to EKG 14/August 2015  Conduction Disutrbances:none  Narrative Interpretation:   Old EKG Reviewed: changes noted  I have personally reviewed the EKG tracing and agree with the computerized printout as noted.   Visual Acuity Review  Right Eye Distance:   Left Eye Distance:   Bilateral Distance:    Right Eye Near:   Left Eye Near:    Bilateral Near:         MDM   1. DOE  (dyspnea on exertion)   2. Tachycardia   3. Edema of right lower extremity   4. Right calf pain   5. Peripheral edema   6. Decreased breath sounds    Patient is currently stable. She has recent onset of dyspnea on exertion as well has an acute increase and swelling of the right lower extremity associated with right calf pain. Risk factors include morbid obesity, hypertension, dyslipidemia and more recently decreased mobility. She is being transferred to [place] for additional evaluation via EMS. IV normal saline, monitor, O2 at 2 L via nasal cannula.    Janne Napoleon, NP 02/12/16 1906

## 2016-02-12 NOTE — Progress Notes (Addendum)
ANTICOAGULATION CONSULT NOTE - Initial Consult  Pharmacy Consult for Heparin Indication: pulmonary embolus  Allergies  Allergen Reactions  . Tylenol [Acetaminophen] Other (See Comments)    States "seems like eats my stomach"  . Sulfa Antibiotics Other (See Comments)    Unknown allergic reaction    Patient Measurements:   Heparin Dosing Weight: 80 kg  Vital Signs: Temp: 97.4 F (36.3 C) (03/21 1750) Temp Source: Oral (03/21 1750) BP: 109/91 mmHg (03/21 2345) Pulse Rate: 106 (03/21 2345)  Labs:  Recent Labs  02/12/16 2041 02/12/16 2052  HGB 11.7* 12.6  HCT 36.0 37.0  PLT 87*  --   CREATININE 1.18* 1.10*    CrCl cannot be calculated (Unknown ideal weight.).   Medical History: Past Medical History  Diagnosis Date  . Hypertension   . High cholesterol   . Arthritis   . Wears glasses   . Numbness     B/LLE  . Edema     B/LLE    Medications:  Lipitor  KCl  Neurontin  Labetalol  Zestril  Demadex  Ultram  Assessment: 65 y.o. female with PE for heparin Goal of Therapy:  Heparin level 0.3-0.7 units/ml Monitor platelets by anticoagulation protocol: Yes   Plan:  Heparin 5000 units IV bolus, then start heparin 1300 units/hr Check heparin level in 6 hours.   Caryl Pina 02/12/2016,11:54 PM  Addendum: Initial level 0.43  Continue Heparin at current rate.  Recheck level in 6 hours to verify.  Phillis Knack, PharmD, BCPS 02/13/2016 6:54 AM

## 2016-02-13 ENCOUNTER — Inpatient Hospital Stay (HOSPITAL_COMMUNITY): Payer: PPO

## 2016-02-13 ENCOUNTER — Encounter (HOSPITAL_COMMUNITY): Payer: Self-pay

## 2016-02-13 DIAGNOSIS — I82411 Acute embolism and thrombosis of right femoral vein: Secondary | ICD-10-CM | POA: Diagnosis not present

## 2016-02-13 DIAGNOSIS — I2609 Other pulmonary embolism with acute cor pulmonale: Secondary | ICD-10-CM | POA: Diagnosis not present

## 2016-02-13 DIAGNOSIS — I471 Supraventricular tachycardia: Secondary | ICD-10-CM | POA: Diagnosis not present

## 2016-02-13 DIAGNOSIS — R000 Tachycardia, unspecified: Secondary | ICD-10-CM | POA: Diagnosis not present

## 2016-02-13 DIAGNOSIS — I2699 Other pulmonary embolism without acute cor pulmonale: Secondary | ICD-10-CM | POA: Diagnosis present

## 2016-02-13 DIAGNOSIS — Z79899 Other long term (current) drug therapy: Secondary | ICD-10-CM | POA: Diagnosis not present

## 2016-02-13 DIAGNOSIS — E785 Hyperlipidemia, unspecified: Secondary | ICD-10-CM | POA: Diagnosis not present

## 2016-02-13 DIAGNOSIS — R0602 Shortness of breath: Secondary | ICD-10-CM | POA: Diagnosis not present

## 2016-02-13 DIAGNOSIS — R0609 Other forms of dyspnea: Secondary | ICD-10-CM | POA: Diagnosis not present

## 2016-02-13 DIAGNOSIS — R600 Localized edema: Secondary | ICD-10-CM | POA: Diagnosis not present

## 2016-02-13 DIAGNOSIS — Z96652 Presence of left artificial knee joint: Secondary | ICD-10-CM | POA: Diagnosis not present

## 2016-02-13 DIAGNOSIS — I872 Venous insufficiency (chronic) (peripheral): Secondary | ICD-10-CM | POA: Diagnosis not present

## 2016-02-13 DIAGNOSIS — I10 Essential (primary) hypertension: Secondary | ICD-10-CM | POA: Diagnosis not present

## 2016-02-13 DIAGNOSIS — N179 Acute kidney failure, unspecified: Secondary | ICD-10-CM | POA: Diagnosis not present

## 2016-02-13 DIAGNOSIS — R609 Edema, unspecified: Secondary | ICD-10-CM

## 2016-02-13 DIAGNOSIS — I82431 Acute embolism and thrombosis of right popliteal vein: Secondary | ICD-10-CM | POA: Diagnosis not present

## 2016-02-13 DIAGNOSIS — Z6841 Body Mass Index (BMI) 40.0 and over, adult: Secondary | ICD-10-CM | POA: Diagnosis not present

## 2016-02-13 DIAGNOSIS — D696 Thrombocytopenia, unspecified: Secondary | ICD-10-CM | POA: Diagnosis not present

## 2016-02-13 DIAGNOSIS — M79661 Pain in right lower leg: Secondary | ICD-10-CM | POA: Diagnosis not present

## 2016-02-13 DIAGNOSIS — N803 Endometriosis of pelvic peritoneum: Secondary | ICD-10-CM | POA: Diagnosis not present

## 2016-02-13 DIAGNOSIS — I82401 Acute embolism and thrombosis of unspecified deep veins of right lower extremity: Secondary | ICD-10-CM | POA: Diagnosis not present

## 2016-02-13 DIAGNOSIS — E860 Dehydration: Secondary | ICD-10-CM | POA: Diagnosis not present

## 2016-02-13 HISTORY — DX: Other pulmonary embolism without acute cor pulmonale: I26.99

## 2016-02-13 LAB — LIPID PANEL
Cholesterol: 129 mg/dL (ref 0–200)
HDL: 48 mg/dL (ref >40)
LDL Cholesterol: 69 mg/dL (ref 0–99)
Total CHOL/HDL Ratio: 2.7 RATIO
Triglycerides: 61 mg/dL (ref <150)
VLDL: 12 mg/dL (ref 0–40)

## 2016-02-13 LAB — COMPREHENSIVE METABOLIC PANEL
ALT: 21 U/L (ref 14–54)
AST: 21 U/L (ref 15–41)
Albumin: 2.9 g/dL — ABNORMAL LOW (ref 3.5–5.0)
Alkaline Phosphatase: 59 U/L (ref 38–126)
Anion gap: 7 (ref 5–15)
BUN: 11 mg/dL (ref 6–20)
CO2: 24 mmol/L (ref 22–32)
Calcium: 8.8 mg/dL — ABNORMAL LOW (ref 8.9–10.3)
Chloride: 106 mmol/L (ref 101–111)
Creatinine, Ser: 1.04 mg/dL — ABNORMAL HIGH (ref 0.44–1.00)
GFR calc Af Amer: >60 mL/min (ref >60)
GFR calc non Af Amer: 55 mL/min — ABNORMAL LOW (ref >60)
Glucose, Bld: 91 mg/dL (ref 65–99)
Potassium: 3.8 mmol/L (ref 3.5–5.1)
Sodium: 137 mmol/L (ref 135–145)
Total Bilirubin: 0.9 mg/dL (ref 0.3–1.2)
Total Protein: 6.3 g/dL — ABNORMAL LOW (ref 6.5–8.1)

## 2016-02-13 LAB — TROPONIN I
Troponin I: 0.04 ng/mL — ABNORMAL HIGH (ref <0.031)
Troponin I: 0.04 ng/mL — ABNORMAL HIGH (ref <0.031)
Troponin I: 0.03 ng/mL (ref <0.031)

## 2016-02-13 LAB — CREATININE, URINE, RANDOM: Creatinine, Urine: 147.13 mg/dL

## 2016-02-13 LAB — PROTIME-INR
INR: 1.36 (ref 0.00–1.49)
Prothrombin Time: 16.8 seconds — ABNORMAL HIGH (ref 11.6–15.2)

## 2016-02-13 LAB — GLUCOSE, CAPILLARY: Glucose-Capillary: 84 mg/dL (ref 65–99)

## 2016-02-13 LAB — APTT: aPTT: 74 seconds — ABNORMAL HIGH (ref 24–37)

## 2016-02-13 LAB — POC OCCULT BLOOD, ED: Fecal Occult Bld: NEGATIVE

## 2016-02-13 LAB — HEPARIN LEVEL (UNFRACTIONATED)
Heparin Unfractionated: 0.43 IU/mL (ref 0.30–0.70)
Heparin Unfractionated: 0.24 IU/mL — ABNORMAL LOW (ref 0.30–0.70)

## 2016-02-13 LAB — MRSA PCR SCREENING: MRSA by PCR: NEGATIVE

## 2016-02-13 MED ORDER — TRAMADOL HCL 50 MG PO TABS
50.0000 mg | ORAL_TABLET | Freq: Two times a day (BID) | ORAL | Status: DC
  Administered 2016-02-13 – 2016-02-16 (×8): 50 mg via ORAL
  Filled 2016-02-13 (×8): qty 1

## 2016-02-13 MED ORDER — ONDANSETRON HCL 4 MG/2ML IJ SOLN: 4.0000 mg | Freq: Three times a day (TID) | INTRAMUSCULAR | Status: DC | PRN

## 2016-02-13 MED ORDER — DIPHENHYDRAMINE HCL 25 MG PO CAPS
25.0000 mg | ORAL_CAPSULE | Freq: Four times a day (QID) | ORAL | Status: DC | PRN
  Administered 2016-02-13: 25 mg via ORAL
  Filled 2016-02-13: qty 1

## 2016-02-13 MED ORDER — DIPHENHYDRAMINE HCL 25 MG PO TABS: 25.0000 mg | ORAL_TABLET | Freq: Four times a day (QID) | ORAL | Status: DC | PRN

## 2016-02-13 MED ORDER — ATORVASTATIN CALCIUM 10 MG PO TABS
10.0000 mg | ORAL_TABLET | Freq: Every day | ORAL | Status: DC
  Administered 2016-02-13 – 2016-02-15 (×3): 10 mg via ORAL
  Filled 2016-02-13 (×3): qty 1

## 2016-02-13 MED ORDER — HYDRALAZINE HCL 20 MG/ML IJ SOLN: 5.0000 mg | INTRAMUSCULAR | Status: DC | PRN

## 2016-02-13 MED ORDER — GABAPENTIN 300 MG PO CAPS
300.0000 mg | ORAL_CAPSULE | Freq: Three times a day (TID) | ORAL | Status: DC
  Administered 2016-02-13 – 2016-02-16 (×10): 300 mg via ORAL
  Filled 2016-02-13 (×10): qty 1

## 2016-02-13 MED ORDER — LABETALOL HCL 100 MG PO TABS
100.0000 mg | ORAL_TABLET | Freq: Two times a day (BID) | ORAL | Status: DC
  Administered 2016-02-13 – 2016-02-16 (×7): 100 mg via ORAL
  Filled 2016-02-13 (×7): qty 1

## 2016-02-13 MED ORDER — SODIUM CHLORIDE 0.9 % IV SOLN
INTRAVENOUS | Status: DC
  Administered 2016-02-13: 04:00:00 via INTRAVENOUS
  Administered 2016-02-13: 1000 mL via INTRAVENOUS

## 2016-02-13 MED ORDER — SODIUM CHLORIDE 0.9% FLUSH
3.0000 mL | Freq: Two times a day (BID) | INTRAVENOUS | Status: DC
  Administered 2016-02-13 – 2016-02-16 (×8): 3 mL via INTRAVENOUS

## 2016-02-13 MED ORDER — MORPHINE SULFATE (PF) 2 MG/ML IV SOLN
2.0000 mg | INTRAVENOUS | Status: DC | PRN
  Administered 2016-02-13 – 2016-02-14 (×3): 2 mg via INTRAVENOUS
  Filled 2016-02-13 (×4): qty 1

## 2016-02-13 NOTE — H&P (Signed)
[facility] History and Physical  Lauren Santana [id number] DOB: [birth year] DOA: 02/12/2016  Referring physician: ED physician PCP: Elyn Peers, MD  Specialists:   Chief Complaint: Shortness of breath and worsening right leg edema  HPI: Lauren Santana is a 65 y.o. female with PMH of hypertension, hyperlipidemia, bilateral lower leg edema, endometriosis (s/p of TAH and BSO) who presents with shortness of breath and right worsening leg edema.  Patient reports that she has been having exertional dyspnea for about 4 days. This is new for her. Pt states that she is usually able to ambulate without shortness of breath. Now she has limited in her activity. She has minimal chest pain. She has worsening right leg edema. She also has tenderness over right leg calf area. She noted that she has not taken her diuretics in the past 2 days. Patient does not have nausea, vomiting, abdominal pain, symptoms of UTI or unilateral weakness.  Of note, pt has history of dysfunction uterine bleeding and endometriosis requiring TAH with BSO. Had recurrence several years ago of intermittent vaginal bleeding. 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 This occasionally will give her vaginal bleeding. None over the last few days. Seen by her GYN doctor [facility] yesterday and given Lupron. This is her first dose of Lupron. Is not on any additional hormone therapy.  In ED, patient was found to haveWBC 7.4, hemoglobin 11.7, platelet 87, temperature normal, tachycardia, tachypnea, achy eyes with creatinine 1.18, d-dimer 20. CTA showed bilateral central and segmental pulmonary emboli with increased RV to LV ratio suggesting right heart strain. Patient is admitted to inpatient for further evaluation treatment. It effaces and discussed with PCCM, recommended admission to stepdown and re-consult to PCCM as needed.  EKG: Independently reviewed. QTC 414,  T-wave eversion only in lead 3. Repeated EKG showed mild mild T-wave inversion in lead 3 and V3-before   Where does patient live?   At home Can patient participate in ADLs?  Some   Review of Systems:   General: no fevers, chills, no changes in body weight, has poor appetite, has fatigue HEENT: no blurry vision, hearing changes or sore throat Pulm: has dyspnea, no coughing, wheezing CV: has chest pain, no palpitations Abd: no nausea, vomiting, abdominal pain, diarrhea, constipation GU: no dysuria, burning on urination, increased urinary frequency, hematuria  Ext: has leg edema Neuro: no unilateral weakness, numbness, or tingling, no vision change or hearing loss Skin: no rash MSK: No muscle spasm, no deformity, no limitation of range of movement in spin Heme: No easy bruising.  Travel history: No recent long distant travel.  Allergy:  Allergies  Allergen Reactions  . Tylenol [Acetaminophen] Other (See Comments)    States "seems like eats my stomach"  . Sulfa Antibiotics Other (See Comments)    Unknown allergic reaction    Past Medical History  Diagnosis Date  . Hypertension   . High cholesterol   . Arthritis   . Wears glasses   . Numbness     B/LLE  . Edema     B/LLE    Past Surgical History  Procedure Laterality Date  . Cesarean section  1969, 1971  . Hernia repair  2001    incisional  . Multiple tooth extractions  2011  . Colonoscopy    . Total knee arthroplasty Left 07/17/2014    Procedure: TOTAL KNEE ARTHROPLASTY;  Surgeon: Kerin Salen, MD;  Location: [facility];  Service: Orthopedics;  Laterality: Left;  .  Abdominal hysterectomy      and BSO    Social History:  reports that she has never smoked. She has never used smokeless tobacco. She reports that she does not drink alcohol or use illicit drugs.  Family History:  Family History  Problem Relation Age of Onset  . Colon cancer Sister   . Cancer Sister     unsure of type of cancer     Prior to Admission  medications   Medication Sig Start Date End Date Taking? Authorizing Provider  atorvastatin (LIPITOR) 10 MG tablet TAKE ONE TABLET BY MOUTH ONCE DAILY IN THE EVENING Patient taking differently: Take 10 mg by mouth daily at 6 PM.  02/14/15   Gildardo Cranker, DO  gabapentin (NEURONTIN) 300 MG capsule Take 300 mg by mouth 3 (three) times daily.    Historical Provider, MD  KLOR-CON M10 10 MEQ tablet TAKE ONE TABLET BY MOUTH ONCE DAILY Patient taking differently: TAKE 10 MEQ BY MOUTH ONCE DAILY 08/24/14   Lauree Chandler, NP  labetalol (NORMODYNE) 100 MG tablet Take 100 mg by mouth 2 (two) times daily.    Historical Provider, MD  lisinopril (PRINIVIL,ZESTRIL) 10 MG tablet Take 10 mg by mouth at bedtime.     Historical Provider, MD  torsemide (DEMADEX) 20 MG tablet Take 20 mg by mouth daily.    Historical Provider, MD  traMADol (ULTRAM) 50 MG tablet Take 50 mg by mouth 2 (two) times daily. 08/06/15   Historical Provider, MD    Physical Exam: Filed Vitals:   02/13/16 0000 02/13/16 0015 02/13/16 0030 02/13/16 0045  BP: 143/67 131/60 129/50 132/61  Pulse: 103 89 103 105  Resp: 23 24 23 19   SpO2: 98% 95% 95% 95%   General: Not in acute distress HEENT:       Eyes: PERRL, EOMI, no scleral icterus.       ENT: No discharge from the ears and nose, no pharynx injection, no tonsillar enlargement.        Neck: No JVD, no bruit, no mass felt. Heme: No neck lymph node enlargement. Cardiac: S1/S2, RRR, No murmurs, No gallops or rubs. Pulm:  No rales, wheezing, rhonchi or rubs. Abd: Soft, nondistended, nontender, no rebound pain, no organomegaly, BS present. Ext: Has asymmetric pitting leg edema bilaterally (R>L). Has right calf tenderness. 2+DP/PT pulse bilaterally. Musculoskeletal: No joint deformities, No joint redness or warmth, no limitation of ROM in spin. Skin: No rashes.  Neuro: Alert, oriented X3, cranial nerves II-XII grossly intact, moves all extremities normally.  Psych: Patient is not  psychotic, no suicidal or hemocidal ideation.  Labs on Admission:  Basic Metabolic Panel:  Recent Labs Lab 02/12/16 2041 02/12/16 2052  NA 139 138  K 4.0 3.8  CL 103 101  CO2 25  --   GLUCOSE 104* 96  BUN 18 19  CREATININE 1.18* 1.10*  CALCIUM 9.5  --    Liver Function Tests: No results for input(s): AST, ALT, ALKPHOS, BILITOT, PROT, ALBUMIN in the last 168 hours. No results for input(s): LIPASE, AMYLASE in the last 168 hours. No results for input(s): AMMONIA in the last 168 hours. CBC:  Recent Labs Lab 02/12/16 2041 02/12/16 2052  WBC 7.4  --   NEUTROABS 4.3  --   HGB 11.7* 12.6  HCT 36.0 37.0  MCV 97.8  --   PLT 87*  --    Cardiac Enzymes: No results for input(s): CKTOTAL, CKMB, CKMBINDEX, TROPONINI in the last 168 hours.  BNP (last 3  results) No results for input(s): BNP in the last 8760 hours.  ProBNP (last 3 results) No results for input(s): PROBNP in the last 8760 hours.  CBG: No results for input(s): GLUCAP in the last 168 hours.  Radiological Exams on Admission: Ct Angio Chest Pe W/cm &/or Wo Cm  02/12/2016  CLINICAL DATA:  Shortness of breath, right leg swelling, and tachycardia for 2 days. EXAM: CT ANGIOGRAPHY CHEST WITH CONTRAST TECHNIQUE: Multidetector CT imaging of the chest was performed using the standard protocol during bolus administration of intravenous contrast. Multiplanar CT image reconstructions and MIPs were obtained to evaluate the vascular anatomy. CONTRAST:  189mL OMNIPAQUE IOHEXOL 350 MG/ML SOLN COMPARISON:  None. FINDINGS: Technically adequate study with good opacification of the central and segmental pulmonary arteries. Filling defects are demonstrated in the distal main pulmonary arteries bilaterally, extending into multiple bilateral upper and lower lobe branches consistent with acute pulmonary embolus. RV to LV ratio is about 2, suggesting possible right heart strain. Normal heart size. Normal caliber thoracic aorta. No aortic  dissection. Great vessel origins are patent. Scattered lymph nodes in the mediastinum are not pathologically enlarged are likely reactive. Small esophageal hiatal hernia. Esophagus is decompressed. Evaluation of lungs is limited due to respiratory motion artifact. Atelectasis is demonstrated in the lung bases. No focal airspace disease or consolidation. No pleural effusions. No pneumothorax. Included portions of the upper abdominal organs demonstrate a cyst in the left kidney. Degenerative changes in the spine. No destructive bone lesions. Review of the MIP images confirms the above findings. IMPRESSION: Bilateral central and segmental pulmonary emboli with increased RV to LV ratio suggesting right heart strain. These results were called by telephone at the time of interpretation on 02/12/2016 at 11:35 pm to Dr. Tanna Furry , who verbally acknowledged these results. Electronically Signed   By: Lucienne Capers M.D.   On: 02/12/2016 23:36    Assessment/Plan Principal Problem:   PE (pulmonary embolism) Active Problems:   Endometriosis of pelvis   Essential hypertension, benign   Edema   Dyslipidemia   Morbid obesity with BMI of 40.0-44.9, adult (HCC)   Pelvic mass in female   Pulmonary emboli (HCC)   AKI (acute kidney injury) ([place])   PE (pulmonary embolism): Patient's shortness of breath and mild chest pain are caused by acute pulmonary embolism as shown by CT angiogram. Patient has right heart strain, hemodynamically stable.  -admit to stepdown for close monitoring overnight -heparin drip initiated -2D echocardiogram ordered -LE dopplers ordered to evaluate for DVT -repeat EKG in a.m. -trop x 3 -pain control: When necessary tramadol and morphine  Endometriosis of pelvis: followed by OB/Gyn. Seen by her GYN doctor [facility] yesterday and given Lupron. -f/u with Dr. Lisbeth Renshaw  HTN: -Hold lisinopril and torsemide due to AKi -IV hydralazine when necessary -Judicious use of diuretics   HLD: Last  LDL was 97 on 10/26/08 -Continue home medications: Lipitor -check FLP  AKI: Likely due to prerenal secondary to dehydration and continuation of ACEI and diruetics - IVF: NS 75 cc/h - Check FeUrea - Follow up renal function by BMP - Hold torsemide and lisinopril  Chronic bilateral leg edema: Etiology is not clear. Patient has venous insufficiency change in lower legs, which is likely the reason -Hold torsemide due to AKI and acute PE -Follow-up 2-D echo to rule out congestive heart failure.  Thrombocytopenia: Platelet 87. Etiologies are clear. May be related to the blood loss secondary to endometriosis. No active bleeding currently. -CBC daily -Close monitor platelet level after  starting IV heparin   DVT ppx: On IV hepain  Code Status: Full code Family Communication: None at bed side. Disposition Plan: Admit to inpatient   Date of Service 02/13/2016    Ivor Costa [facility] Pager [phone]  If 7PM-7AM, please contact night-coverage [email or web address] Password [email or web address] 02/13/2016, 2:25 AM

## 2016-02-13 NOTE — Progress Notes (Signed)
PT Cancellation Note  Patient Details Name: MATTI DIPAOLA MRN: [id number] DOB: 04-02-1951   Cancelled Treatment:    Reason Eval/Treat Not Completed: Medical issues which prohibited therapy. RN requested to defer PT for today for new onset of DVT/PE. Will follow up tomorrow.   Colon Branch, SPT Colon Branch 02/13/2016, 9:14 AM

## 2016-02-13 NOTE — Progress Notes (Signed)
   02/13/16 1144  Clinical Encounter Type  Visited With Patient and family together  Visit Type Initial;Spiritual support  Spiritual Encounters  Spiritual Needs Prayer;[facility] text;Literature   Chaplain responded to a request for an advance directive. Upon arrival, patient requested that we read some Scripture and share in prayer. Chaplain, patient, and patient's son participated in that. Chaplain asked about HCPOA and living will forms, and the patient's son expressed a desire to complete those forms after this hospitalization. Chaplain services available as needed.   Jeri Lager, Chaplain 02/13/2016 11:49 AM

## 2016-02-13 NOTE — Progress Notes (Signed)
Pt c/o leg pain - see pain assessment. Pt complained that she cannot get comfortable tried to reposition x 2 in the bed - pt decided it was best to get out of bed stand, use BSC than get back into bed & try again. Now she says foot and head are fine. Had her scheduled Tramadol and Neurontin.

## 2016-02-13 NOTE — Progress Notes (Signed)
ANTICOAGULATION CONSULT NOTE - Follow Up Consult  Pharmacy Consult for heparin Indication: pulmonary embolus  Allergies  Allergen Reactions  . Tylenol [Acetaminophen] Other (See Comments)    States "seems like eats my stomach"  . Sulfa Antibiotics Other (See Comments)    Unknown allergic reaction    Patient Measurements: Height: 5\' 2"  (157.5 cm) Weight: 248 lb 14.4 oz (112.9 kg) IBW/kg (Calculated) : 50.1 Heparin Dosing Weight: 80 kg  Vital Signs: Temp: 98.6 F (37 C) (03/22 1221) Temp Source: Oral (03/22 1221) BP: 92/79 mmHg (03/22 1221) Pulse Rate: 85 (03/22 1221)  Labs:  Recent Labs  02/12/16 2041 02/12/16 2052 02/13/16 0455 02/13/16 1517  HGB 11.7* 12.6  --   --   HCT 36.0 37.0  --   --   PLT 87*  --   --   --   APTT  --   --  74*  --   LABPROT  --   --  16.8*  --   INR  --   --  1.36  --   HEPARINUNFRC  --   --  0.43 0.24*  CREATININE 1.18* 1.10* 1.04*  --   TROPONINI  --   --  0.04*  0.04*  --     Estimated Creatinine Clearance: 63.2 mL/min (by C-G formula based on Cr of 1.04).   Medications:  Scheduled:  . atorvastatin  10 mg Oral q1800  . gabapentin  300 mg Oral TID  . labetalol  100 mg Oral BID  . sodium chloride flush  3 mL Intravenous Q12H  . traMADol  50 mg Oral BID   Infusions:  . sodium chloride 75 mL/hr at 02/13/16 0414  . heparin 1,300 Units/hr (02/13/16 1526)    Assessment: 65 yo female with PE is currently on subtherapeutic heparin.  Heparin level was 0.24. No problem with infusion per RN  Goal of Therapy:  Heparin level 0.3-0.7 units/ml Monitor platelets by anticoagulation protocol: Yes   Plan:  - increase heparin to 1450 units/hr - 6hr heparin level  Lauren Santana, Tsz-Yin 02/13/2016,3:56 PM

## 2016-02-13 NOTE — Progress Notes (Signed)
Text page Dr Maryland Pink About preliminary results from vascular study positive for Rt leg DVT Femoral & popliteal veins

## 2016-02-13 NOTE — ED Notes (Signed)
Dr. Niu at bedside at this time.  

## 2016-02-13 NOTE — Progress Notes (Signed)
*  PRELIMINARY RESULTS* Vascular Ultrasound Lower extremity venous duplex has been completed.  Preliminary findings: Right = Occlusive DVT noted in the femoral vein and popliteal vein. Cannot visualize calf veins. Left = No obvious evidence of DVT.   Landry Mellow, RDMS, RVT  02/13/2016, 8:49 AM

## 2016-02-13 NOTE — Progress Notes (Signed)
[facility] PROGRESS NOTE  Lauren Santana [id number] DOB: March 22, 1951 DOA: 02/12/2016  PCP: Elyn Peers, MD  Brief HPI: 65 year old African-American female with a past medical history of hypertension, hyperlipidemia, bilateral heel edema, endometriosis with on and off vaginal bleeding, presented with exertional dyspnea for 4 days. Patient is also noted to be obese. She was found to have acute pulmonary embolism. She was hospitalized for further management.  Past medical history:  Past Medical History  Diagnosis Date  . Hypertension   . High cholesterol   . Arthritis   . Wears glasses   . Numbness     B/LLE  . Edema     B/LLE    Consultants: None  Procedures:  Echocardiogram is pending  Antibiotics: None  Subjective: Patient somewhat emotionally labile this morning. States that her shortness of breath is improved. Denies any chest pain. No nausea or vomiting.  Objective: Vital Signs  Filed Vitals:   02/13/16 0900 02/13/16 1000 02/13/16 1005 02/13/16 1221  BP: 110/69 96/79 96/79  92/79  Pulse: 94 103 101 85  Temp:    98.6 F (37 C)  TempSrc:    Oral  Resp: 23 24  23   Height:      Weight:      SpO2: 95% 96%  97%    Intake/Output Summary (Last 24 hours) at 02/13/16 1233 Last data filed at 02/13/16 1020  Gross per 24 hour  Intake 561.82 ml  Output    450 ml  Net 111.82 ml   Filed Weights   02/13/16 0400  Weight: 112.9 kg (248 lb 14.4 oz)    General appearance: alert, cooperative, appears stated age and no distress Resp: clear to auscultation bilaterally Cardio: regular rate and rhythm, S1, S2 normal, no murmur, click, rub or gallop GI: soft, non-tender; bowel sounds normal; no masses,  no organomegaly Extremities: Bilateral nonpitting edema is noted in the lower extremities Neurologic: Awake and alert. Oriented 3. No focal neurological deficits.  Lab Results:  Basic Metabolic Panel:  Recent Labs Lab 02/12/16 2041 02/12/16 2052  02/13/16 0455  NA 139 138 137  K 4.0 3.8 3.8  CL 103 101 106  CO2 25  --  24  GLUCOSE 104* 96 91  BUN 18 19 11   CREATININE 1.18* 1.10* 1.04*  CALCIUM 9.5  --  8.8*   Liver Function Tests:  Recent Labs Lab 02/13/16 0455  AST 21  ALT 21  ALKPHOS 59  BILITOT 0.9  PROT 6.3*  ALBUMIN 2.9*   CBC:  Recent Labs Lab 02/12/16 2041 02/12/16 2052  WBC 7.4  --   NEUTROABS 4.3  --   HGB 11.7* 12.6  HCT 36.0 37.0  MCV 97.8  --   PLT 87*  --    Cardiac Enzymes:  Recent Labs Lab 02/13/16 0455  TROPONINI 0.04*  0.04*   CBG:  Recent Labs Lab 02/13/16 0756  GLUCAP 84    Recent Results (from the past 240 hour(s))  MRSA PCR Screening     Status: None   Collection Time: 02/13/16  4:05 AM  Result Value Ref Range Status   MRSA by PCR NEGATIVE NEGATIVE Final    Comment:        The GeneXpert MRSA Assay (FDA approved for NASAL specimens only), is one component of a comprehensive MRSA colonization surveillance program. It is not intended to diagnose MRSA infection nor to guide or monitor treatment for MRSA infections.       Studies/Results: Ct Angio Chest Pe  W/cm &/or Wo Cm  02/12/2016  CLINICAL DATA:  Shortness of breath, right leg swelling, and tachycardia for 2 days. EXAM: CT ANGIOGRAPHY CHEST WITH CONTRAST TECHNIQUE: Multidetector CT imaging of the chest was performed using the standard protocol during bolus administration of intravenous contrast. Multiplanar CT image reconstructions and MIPs were obtained to evaluate the vascular anatomy. CONTRAST:  152mL OMNIPAQUE IOHEXOL 350 MG/ML SOLN COMPARISON:  None. FINDINGS: Technically adequate study with good opacification of the central and segmental pulmonary arteries. Filling defects are demonstrated in the distal main pulmonary arteries bilaterally, extending into multiple bilateral upper and lower lobe branches consistent with acute pulmonary embolus. RV to LV ratio is about 2, suggesting possible right heart strain.  Normal heart size. Normal caliber thoracic aorta. No aortic dissection. Great vessel origins are patent. Scattered lymph nodes in the mediastinum are not pathologically enlarged are likely reactive. Small esophageal hiatal hernia. Esophagus is decompressed. Evaluation of lungs is limited due to respiratory motion artifact. Atelectasis is demonstrated in the lung bases. No focal airspace disease or consolidation. No pleural effusions. No pneumothorax. Included portions of the upper abdominal organs demonstrate a cyst in the left kidney. Degenerative changes in the spine. No destructive bone lesions. Review of the MIP images confirms the above findings. IMPRESSION: Bilateral central and segmental pulmonary emboli with increased RV to LV ratio suggesting right heart strain. These results were called by telephone at the time of interpretation on 02/12/2016 at 11:35 pm to Dr. Tanna Furry , who verbally acknowledged these results. Electronically Signed   By: Lucienne Capers M.D.   On: 02/12/2016 23:36    Medications:  Scheduled: . atorvastatin  10 mg Oral q1800  . gabapentin  300 mg Oral TID  . labetalol  100 mg Oral BID  . sodium chloride flush  3 mL Intravenous Q12H  . traMADol  50 mg Oral BID   Continuous: . sodium chloride 75 mL/hr at 02/13/16 0414  . heparin 1,300 Units/hr (02/13/16 0017)   [id number], hydrALAZINE, morphine injection, ondansetron  Assessment/Plan:  Principal Problem:   PE (pulmonary embolism) Active Problems:   Endometriosis of pelvis   Essential hypertension, benign   Edema   Dyslipidemia   Morbid obesity with BMI of 40.0-44.9, adult (HCC)   Pelvic mass in female   Pulmonary emboli (HCC)   AKI (acute kidney injury) ([place])    Acute PE (pulmonary embolism) and RLE DVT Patient's shortness of breath and mild chest pain are caused by acute pulmonary embolism as shown by CT angiogram. Patient has right heart strain but is hemodynamically stable. Continue IV  heparin. Doppler studies do reveal right lower extremity DVT in the femoral and popliteal veins. She has risk factors for thromboembolism including obesity, decreased mobility. No obvious known history of cancer. Patient will be transitioned to oral anticoagulation tomorrow.  Endometriosis of pelvis Followed by OB/Gyn, Dr. Benjie Karvonen. Seen by her GYN yesterday and given Lupron. No active bleeding currently. She has been seen previously by GYN oncology as well. Her risk of bleeding will be higher by being on oral anticoagulation. Will need to be monitored closely in the outpatient setting. Will discuss with her gynecologist.  Essential HTN Monitor blood pressures closely. Judicious use of diuretics   Hyperlipidemia  Continue home medications: Lipitor  Mild Acute kidney injury  Likely due to prerenal secondary to dehydration and continuation of ACEI and diruetics. Renal function is stable. Continue to monitor for now.  Chronic bilateral leg edema Etiology is not clear. Patient has venous insufficiency  change in lower legs, which is likely the reason. Holding her diuretics. Venous Dopplers do show DVT in the right leg. Echocardiogram is pending.  Thrombocytopenia Platelet counts are low. They have been low in the past as well. Etiology is unclear. Could be due to consumption in the acute setting. Monitor closely.    DVT Prophylaxis: On IV heparin    Code Status: Full code  Family Communication: Discussed with the patient  Disposition Plan: Await echocardiogram. Anticipate transition to oral anticoagulation tomorrow.    LOS: 0 days   [facility] Pager [phone] 02/13/2016, 12:33 PM  If 7PM-7AM, please contact night-coverage at [email or web address], password [facility]

## 2016-02-14 ENCOUNTER — Other Ambulatory Visit (HOSPITAL_COMMUNITY): Payer: PPO

## 2016-02-14 DIAGNOSIS — N803 Endometriosis of pelvic peritoneum: Secondary | ICD-10-CM

## 2016-02-14 DIAGNOSIS — I82401 Acute embolism and thrombosis of unspecified deep veins of right lower extremity: Secondary | ICD-10-CM

## 2016-02-14 DIAGNOSIS — I10 Essential (primary) hypertension: Secondary | ICD-10-CM

## 2016-02-14 HISTORY — DX: Acute embolism and thrombosis of unspecified deep veins of right lower extremity: I82.401

## 2016-02-14 LAB — CBC
HCT: 32.5 % — ABNORMAL LOW (ref 36.0–46.0)
Hemoglobin: 10.5 g/dL — ABNORMAL LOW (ref 12.0–15.0)
MCH: 31.9 pg (ref 26.0–34.0)
MCHC: 32.3 g/dL (ref 30.0–36.0)
MCV: 98.8 fL (ref 78.0–100.0)
Platelets: 96 10*3/uL — ABNORMAL LOW (ref 150–400)
RBC: 3.29 MIL/uL — ABNORMAL LOW (ref 3.87–5.11)
RDW: 14.2 % (ref 11.5–15.5)
WBC: 6.6 10*3/uL (ref 4.0–10.5)

## 2016-02-14 LAB — GLUCOSE, CAPILLARY: Glucose-Capillary: 78 mg/dL (ref 65–99)

## 2016-02-14 LAB — BASIC METABOLIC PANEL
Anion gap: 9 (ref 5–15)
BUN: 9 mg/dL (ref 6–20)
CO2: 23 mmol/L (ref 22–32)
Calcium: 8.3 mg/dL — ABNORMAL LOW (ref 8.9–10.3)
Chloride: 106 mmol/L (ref 101–111)
Creatinine, Ser: 1.03 mg/dL — ABNORMAL HIGH (ref 0.44–1.00)
GFR calc Af Amer: >60 mL/min (ref >60)
GFR calc non Af Amer: 55 mL/min — ABNORMAL LOW (ref >60)
Glucose, Bld: 97 mg/dL (ref 65–99)
Potassium: 4.6 mmol/L (ref 3.5–5.1)
Sodium: 138 mmol/L (ref 135–145)

## 2016-02-14 LAB — UREA NITROGEN, URINE: Urea Nitrogen, Ur: 882 mg/dL

## 2016-02-14 LAB — HEPARIN LEVEL (UNFRACTIONATED)
Heparin Unfractionated: 0.47 IU/mL (ref 0.30–0.70)
Heparin Unfractionated: 0.38 IU/mL (ref 0.30–0.70)

## 2016-02-14 MED ORDER — RIVAROXABAN 15 MG PO TABS
15.0000 mg | ORAL_TABLET | Freq: Two times a day (BID) | ORAL | Status: DC
  Administered 2016-02-14 – 2016-02-16 (×5): 15 mg via ORAL
  Filled 2016-02-14 (×5): qty 1

## 2016-02-14 MED ORDER — METOPROLOL TARTRATE 1 MG/ML IV SOLN
2.5000 mg | Freq: Once | INTRAVENOUS | Status: AC
  Administered 2016-02-14: 2.5 mg via INTRAVENOUS
  Filled 2016-02-14: qty 5

## 2016-02-14 MED ORDER — RIVAROXABAN 20 MG PO TABS: 20.0000 mg | ORAL_TABLET | Freq: Every day | ORAL | Status: DC

## 2016-02-14 NOTE — Progress Notes (Signed)
[facility] for Xarelto Indication: pulmonary embolus  Allergies  Allergen Reactions  . Tylenol [Acetaminophen] Other (See Comments)    States "seems like eats my stomach"  . Sulfa Antibiotics Other (See Comments)    Unknown allergic reaction    Patient Measurements: Height: 5\' 2"  (157.5 cm) Weight: 248 lb 14.4 oz (112.9 kg) IBW/kg (Calculated) : 50.1 Heparin Dosing Weight: 80 kg  Vital Signs: Temp: 98.5 F (36.9 C) (03/23 0446) Temp Source: Oral (03/23 0446) BP: 114/68 mmHg (03/23 0448) Pulse Rate: 82 (03/23 0448)  Labs:  Recent Labs  02/12/16 2041 02/12/16 2052  02/13/16 0455 02/13/16 1517 02/13/16 2329 02/14/16 0504  HGB 11.7* 12.6  --   --   --   --  10.5*  HCT 36.0 37.0  --   --   --   --  32.5*  PLT 87*  --   --   --   --   --  96*  APTT  --   --   --  74*  --   --   --   LABPROT  --   --   --  16.8*  --   --   --   INR  --   --   --  1.36  --   --   --   HEPARINUNFRC  --   --   < > 0.43 0.24* 0.47 0.38  CREATININE 1.18* 1.10*  --  1.04*  --   --  1.03*  TROPONINI  --   --   --  0.04*  0.04* 0.03  --   --   < > = values in this interval not displayed.  Estimated Creatinine Clearance: 63.8 mL/min (by C-G formula based on Cr of 1.03).  Assessment: 65 yo female with PE, currently on heparin, to transition to Xarelto   Plan:  D/C heparin  Xarelto 15 mg BID x 21 days, then 20 mg daily.  Phillis Knack, PharmD, BCPS  02/14/2016,7:32 AM

## 2016-02-14 NOTE — Progress Notes (Signed)
ANTICOAGULATION CONSULT NOTE - Follow Up Consult  Pharmacy Consult for heparin Indication: pulmonary embolus  Allergies  Allergen Reactions  . Tylenol [Acetaminophen] Other (See Comments)    States "seems like eats my stomach"  . Sulfa Antibiotics Other (See Comments)    Unknown allergic reaction    Patient Measurements: Height: 5\' 2"  (157.5 cm) Weight: 248 lb 14.4 oz (112.9 kg) IBW/kg (Calculated) : 50.1 Heparin Dosing Weight: 80 kg  Vital Signs: Temp: 99.3 F (37.4 C) (03/22 2104) Temp Source: Oral (03/22 2104) BP: 129/66 mmHg (03/22 2104) Pulse Rate: 93 (03/22 2104)  Labs:  Recent Labs  02/12/16 2041 02/12/16 2052 02/13/16 0455 02/13/16 1517 02/13/16 2329  HGB 11.7* 12.6  --   --   --   HCT 36.0 37.0  --   --   --   PLT 87*  --   --   --   --   APTT  --   --  74*  --   --   LABPROT  --   --  16.8*  --   --   INR  --   --  1.36  --   --   HEPARINUNFRC  --   --  0.43 0.24* 0.47  CREATININE 1.18* 1.10* 1.04*  --   --   TROPONINI  --   --  0.04*  0.04* 0.03  --     Estimated Creatinine Clearance: 63.2 mL/min (by C-G formula based on Cr of 1.04).  Assessment: 65 yo female with PE is currently on therapeutic heparin. No bleeding noted.  Goal of Therapy:  Heparin level 0.3-0.7 units/ml Monitor platelets by anticoagulation protocol: Yes   Plan:  - Continue heparin at 1450 units/hr - Will f/u a.m. heparin level to confirm therapeutic  Sherlon Handing, PharmD, BCPS Clinical pharmacist, pager [phone] 02/14/2016,12:14 AM

## 2016-02-14 NOTE — Progress Notes (Signed)
Called report to [facility] on [facility] will transfer per w/c. Family aware of pending transfer

## 2016-02-14 NOTE — Discharge Instructions (Addendum)
Pulmonary Embolism A pulmonary embolism (PE) is a sudden blockage or decrease of blood flow in one lung or both lungs. Most blockages come from a blood clot that travels from the legs or the pelvis to the lungs. PE is a dangerous and potentially life-threatening condition if it is not treated right away. CAUSES A pulmonary embolism occurs most commonly when a blood clot travels from one of your veins to your lungs. Rarely, PE is caused by air, fat, amniotic fluid, or part of a tumor traveling through your veins to your lungs. RISK FACTORS A PE is more likely to develop in:  People who smoke.  People who areolder, especially over 65 years of age.  People who are overweight (obese).  People who sit or lie still for a long time, such as during long-distance travel (over 4 hours), bed rest, hospitalization, or during recovery from certain medical conditions like a stroke.  People who do not engage in much physical activity (sedentary lifestyle).  People who have chronic breathing disorders.  People whohave a personal or family history of blood clots or blood clotting disease.  People whohave peripheral vascular disease (PVD), diabetes, or some types of cancer.  People who haveheart disease, especially if the person had a recent heart attack or has congestive heart failure.  People who have neurological diseases that affect the legs (leg paresis).  People who have had a traumatic injury, such as breaking a hip or leg.  People whohave recently had major or lengthy surgery, especially on the hip, knee, or abdomen.  People who have hada central line placed inside a large vein.  People who takemedicines that contain the hormone estrogen. These include birth control pills and hormone replacement therapy.  Pregnancy or during childbirth or the postpartum period. SIGNS AND SYMPTOMS  The symptoms of a PE usually start suddenly and include:  Shortness of breath while active or at  rest.  Coughing or coughing up blood or blood-tinged mucus.  Chest pain that is often worse with deep breaths.  Rapid or irregular heartbeat.  Feeling light-headed or dizzy.  Fainting.  Feelinganxious.  Sweating. There may also be pain and swelling in a leg if that is where the blood clot started. These symptoms may represent a serious problem that is an emergency. Do not wait to see if the symptoms will go away. Get medical help right away. Call your local emergency services (911 in the [place] Do not drive yourself to the hospital. DIAGNOSIS Your health care provider will take a medical history and perform a physical exam. You may also have other tests, including:  Blood tests to assess the clotting properties of your blood, assess oxygen levels in your blood, and find blood clots.  Imaging tests, such as CT, ultrasound, MRI, X-ray, and other tests to see if you have clots anywhere in your body.  An electrocardiogram (ECG) to look for heart strain from blood clots in the lungs. TREATMENT The main goals of PE treatment are:  To stop a blood clot from growing larger.  To stop new blood clots from forming. The type of treatment that you receive depends on many factors, such as the cause of your PE, your risk for bleeding or developing more clots, and other medical conditions that you have. Sometimes, a combination of treatments is necessary. This condition may be treated with:  Medicines, including newer oral blood thinners (anticoagulants), warfarin, low molecular weight heparins, thrombolytics, or heparins.  Wearing compression stockings or using different types  of devices.  Surgery (rare) to remove the blood clot or to place a filter in your abdomen to stop the blood clot from traveling to your lungs. Treatments for a PE are often divided into immediate treatment, long-term treatment (up to 3 months after PE), and extended treatment (more than 3 months after PE). Your  treatment may continue for several months. This is called maintenance therapy, and it is used to prevent the forming of new blood clots. You can work with your health care provider to choose the treatment program that is best for you. What are anticoagulants? Anticoagulants are medicines that treat PEs. They can stop current blood clots from growing and stop new clots from forming. They cannot dissolve existing clots. Your body dissolves clots by itself over time. Anticoagulants are given by mouth, by injection, or through an IV tube. What are thrombolytics? Thrombolytics are clot-dissolving medicines that are used to dissolve a PE. They carry a high risk of bleeding, so they tend to be used only in severe cases or if you have very low blood pressure. HOME CARE INSTRUCTIONS If you are taking a newer oral anticoagulant:  Take the medicine every single day at the same time each day.  Understand what foods and drugs interact with this medicine.  Understand that there are no regular blood tests required when using this medicine.  Understandthe side effects of this medicine, including excessive bruising or bleeding. Ask your health care provider or pharmacist about other possible side effects. If you are taking warfarin:  Understand how to take warfarin and know which foods can affect how warfarin works in Veterinary surgeon.  Understand that it is dangerous to taketoo much or too little warfarin. Too much warfarin increases the risk of bleeding. Too little warfarin continues to allow the risk for blood clots.  Follow your PT and INR blood testing schedule. The PT and INR results allow your health care provider to adjust your dose of warfarin. It is very important that you have your PT and INR tested as often as told by your health care provider.  Avoid major changes in your diet, or tell your health care provider before you change your diet. Arrange a visit with a registered dietitian to answer your  questions. Many foods, especially foods that are high in vitamin K, can interfere with warfarin and affect the PT and INR results. Eat a consistent amount of foods that are high in vitamin K, such as:  Spinach, kale, broccoli, cabbage, collard greens, turnip greens, Brussels sprouts, peas, cauliflower, seaweed, and parsley.  Beef liver and pork liver.  Green tea.  Soybean oil.  Tell your health care provider about any and all medicines, vitamins, and supplements that you take, including aspirin and other over-the-counter anti-inflammatory medicines. Be especially cautious with aspirin and anti-inflammatory medicines. Do not take those before you ask your health care provider if it is safe to do so. This is important because many medicines can interfere with warfarin and affect the PT and INR results.  Do not start or stop taking any over-the-counter or prescription medicine unless your health care provider or pharmacist tells you to do so. If you take warfarin, you will also need to do these things:  Hold pressure over cuts for longer than usual.  Tell your dentist and other health care providers that you are taking warfarin before you have any procedures in which bleeding may occur.  Avoid alcohol or drink very small amounts. Tell your health care provider  if you change your alcohol intake.  Do not use tobacco products, including cigarettes, chewing tobacco, and e-cigarettes. If you need help quitting, ask your health care provider.  Avoid contact sports. General Instructions  Take over-the-counter and prescription medicines only as told by your health care provider. Anticoagulant medicines can have side effects, including easy bruising and difficulty stopping bleeding. If you are prescribed an anticoagulant, you will also need to do these things:  Hold pressure over cuts for longer than usual.  Tell your dentist and other health care providers that you are taking anticoagulants  before you have any procedures in which bleeding may occur.  Avoid contact sports.  Wear a medical alert bracelet or carry a medical alert card that says you have had a PE.  Ask your health care provider how soon you can go back to your normal activities. Stay active to prevent new blood clots from forming.  Make sure to exercise while traveling or when you have been sitting or standing for a long period of time. It is very important to exercise. Exercise your legs by walking or by tightening and relaxing your leg muscles often. Take frequent walks.  Wear compression stockings as told by your health care provider to help prevent more blood clots from forming.  Do not use tobacco products, including cigarettes, chewing tobacco, and e-cigarettes. If you need help quitting, ask your health care provider.  Keep all follow-up appointments with your health care provider. This is important. PREVENTION Take these actions to decrease your risk of developing another PE:  Exercise regularly. For at least 30 minutes every day, engage in:  Activity that involves moving your arms and legs.  Activity that encourages good blood flow through your body by increasing your heart rate.  Exercise your arms and legs every hour during long-distance travel (over 4 hours). Drink plenty of water and avoid drinking alcohol while traveling.  Avoid sitting or lying in bed for long periods of time without moving your legs.  Maintain a weight that is appropriate for your height. Ask your health care provider what weight is healthy for you.  If you are a woman who is over 58 years of age, avoid unnecessary use of medicines that contain estrogen. These include birth control pills.  Do not smoke, especially if you take estrogen medicines. If you need help quitting, ask your health care provider.  If you are at very high risk for PE, wear compression stockings.  If you recently had a PE, have regularly scheduled  ultrasound testing on your legs to check for new blood clots. If you are hospitalized, prevention measures may include:  Early walking after surgery, as soon as your health care provider says that it is safe.  Receiving anticoagulants to prevent blood clots. If you cannot take anticoagulants, other options may be available, such as wearing compression stockings or using different types of devices. SEEK IMMEDIATE MEDICAL CARE IF:  You have new or increased pain, swelling, or redness in an arm or leg.  You have numbness or tingling in an arm or leg.  You have shortness of breath while active or at rest.  You have chest pain.  You have a rapid or irregular heartbeat.  You feel light-headed or dizzy.  You cough up blood.  You notice blood in your vomit, bowel movement, or urine.  You have a fever. These symptoms may represent a serious problem that is an emergency. Do not wait to see if the symptoms will  go away. Get medical help right away. Call your local emergency services (911 in the [place] Do not drive yourself to the hospital.   This information is not intended to replace advice given to you by your health care provider. Make sure you discuss any questions you have with your health care provider.   Document Released: 11/07/2000 Document Revised: 08/01/2015 Document Reviewed: 03/07/2015 Elsevier Interactive Patient Education 2016 [place] on my medicine - XARELTO (rivaroxaban)  This medication education was reviewed with me or my healthcare representative as part of my discharge preparation.  The pharmacist that spoke with me during my hospital stay was:  Lavenia Atlas, [place]? Xarelto was prescribed to treat blood clots that may have been found in the veins of your legs (deep vein thrombosis) or in your lungs (pulmonary embolism) and to reduce the risk of them occurring again.  What do you need to know about  Xarelto? The starting dose is one 15 mg tablet taken TWICE daily with food for the FIRST 21 DAYS then on (enter date)  03/07/16  the dose is changed to one 20 mg tablet taken ONCE A DAY with your evening meal.  DO NOT stop taking Xarelto without talking to the health care provider who prescribed the medication.  Refill your prescription for 20 mg tablets before you run out.  After discharge, you should have regular check-up appointments with your healthcare provider that is prescribing your Xarelto.  In the future your dose may need to be changed if your kidney function changes by a significant amount.  What do you do if you miss a dose? If you are taking Xarelto TWICE DAILY and you miss a dose, take it as soon as you remember. You may take two 15 mg tablets (total 30 mg) at the same time then resume your regularly scheduled 15 mg twice daily the next day.  If you are taking Xarelto ONCE DAILY and you miss a dose, take it as soon as you remember on the same day then continue your regularly scheduled once daily regimen the next day. Do not take two doses of Xarelto at the same time.   Important Safety Information Xarelto is a blood thinner medicine that can cause bleeding. You should call your healthcare provider right away if you experience any of the following: ? Bleeding from an injury or your nose that does not stop. ? Unusual colored urine (red or dark brown) or unusual colored stools (red or black). ? Unusual bruising for unknown reasons. ? A serious fall or if you hit your head (even if there is no bleeding).  Some medicines may interact with Xarelto and might increase your risk of bleeding while on Xarelto. To help avoid this, consult your healthcare provider or pharmacist prior to using any new prescription or non-prescription medications, including herbals, vitamins, non-steroidal anti-inflammatory drugs (NSAIDs) and supplements.  This website has more information on Xarelto:  [email or web address]

## 2016-02-14 NOTE — Care Management Note (Signed)
Case Management Note  Patient Details  Name: KAJA KEENER MRN: [id number] Date of Birth: 08-13-1951  Subjective/Objective:    Pt will be discharged on Xarelto.  Per CMA: Xarelto is covered, tier 3, patient received additional help with medication co-pays, making the co-pay for this medication $8.25 at the local retail pharmacy/ no auth required/ patient can use any retail pharmacy.  Information and card for 30-day free trial period given to pt and son, Dymphna Galeski [id number]).  Discussed PT recommendations for [facility] for rehab and son requests that CSW include him in any conversation related to same.  Pt agrees.                            Expected Discharge Plan:  [facility]  In-House Referral:  Clinical Social Work  Discharge planning Services  CM Consult, Medication Assistance  Status of Service:  In process, will continue to follow  Girard Cooter, RN 02/14/2016, 2:22 PM

## 2016-02-14 NOTE — Evaluation (Signed)
Physical Therapy Evaluation Patient Details Name: Lauren Santana MRN: [id number] DOB: Oct 15, 1951 Today's Date: 02/14/2016   History of Present Illness  Lauren Santana is a 65 y.o. female with PMH of hypertension, hyperlipidemia, bilateral lower leg edema, endometriosis (s/p of TAH and BSO) who presents with shortness of breath and right worsening leg edema. CTA showed bilateral central and segmental pulmonary emboli with increased RV to LV ratio suggesting right heart strain. Venous Dopplers do show DVT in the right leg.   Clinical Impression  Pt admitted with above diagnosis. Pt currently with functional limitations due to the deficits listed below (see PT Problem List). Pt is fairly stable with the RW but has very little activity tolerance at this time as evidenced by her need to take several standing rest breaks and forward leaning with forearms on RW. Pt will benefit from skilled PT to increase their independence and safety with mobility to allow discharge to the venue listed below. Pt lives alone and would benefit from SNF rehab to become more independent with functional mobility and decrease fall risk before returning home. Pt agrees but stated several times that she only wanted to go somewhere that her insurance would cover completely because she could not afford to pay anything.      Follow Up Recommendations SNF;Supervision/Assistance - 24 hour    Equipment Recommendations  Other (comment) (Rollator (Pt states only if insurance will cover))    Recommendations for Other Services OT consult     Precautions / Restrictions Precautions Precautions: Fall Restrictions Weight Bearing Restrictions: No      Mobility  Bed Mobility Overal bed mobility: Modified Independent             General bed mobility comments: Pt states that she does not sleep flat at all at home that she sleeps propped on pillows. Pt was Independent with use of rails and HOB elevated.    Transfers Overall transfer level: Needs assistance Equipment used: Rolling walker (2 wheeled) Transfers: Sit to/from Stand Sit to Stand: Min assist;From elevated surface         General transfer comment: Pt needed Min A to stand up with use of RW and needed VC's for correct use of RW.   Ambulation/Gait Ambulation/Gait assistance: Min guard Ambulation Distance (Feet): 100 Feet Assistive device: Rolling walker (2 wheeled) Gait Pattern/deviations: Step-to pattern;Decreased stride length;Antalgic;Trunk flexed;Wide base of support Gait velocity: very slow Gait velocity interpretation: Below normal speed for age/gender General Gait Details: Pt needs VC's for correct use of RW as she likes to push it way in front of her. Pt c/o of R LE pain with ambulation. Pt stops frequently with walking and takes standing rest breaks.   Stairs            Wheelchair Mobility    Modified Rankin (Stroke Patients Only)       Balance Overall balance assessment: Needs assistance Sitting-balance support: No upper extremity supported;Feet unsupported Sitting balance-Leahy Scale: Good     Standing balance support: Bilateral upper extremity supported Standing balance-Leahy Scale: Poor Standing balance comment: Reliant on use of RW.                              Pertinent Vitals/Pain Pain Assessment: Faces Faces Pain Scale: Hurts little more Pain Location: R LE Pain Descriptors / Indicators: Aching;Constant;Grimacing Pain Intervention(s): Monitored during session;Limited activity within patient's tolerance;Patient requesting pain meds-RN notified  Vitals stable throughout activity on room  air.     Home Living Family/patient expects to be discharged to:: Skilled nursing facility Living Arrangements: Alone   Type of Home: Apartment Home Access: Level entry     Home Layout: One level Home Equipment: [place] - 2 wheels;Tub bench;Cane - single point      Prior Function Level  of Independence: Independent with assistive device(s)         Comments: Pt states she has been walking with a cane at all times for about a month now because she wants to be "extra careful"     Hand Dominance   Dominant Hand: Right    Extremity/Trunk Assessment   Upper Extremity Assessment: Defer to OT evaluation           Lower Extremity Assessment: Generalized weakness      Cervical / Trunk Assessment: Kyphotic  Communication   Communication: No difficulties  Cognition Arousal/Alertness: Awake/alert Behavior During Therapy: WFL for tasks assessed/performed Overall Cognitive Status: Within Functional Limits for tasks assessed                      General Comments General comments (skin integrity, edema, etc.): Pt cooperative with therapy but did not like having to do therapy before breakfast. She kept repeating that she "didn't know I would have to do this first thing when I didn't get up at all yesterday".     Exercises        Assessment/Plan    PT Assessment Patient needs continued PT services  PT Diagnosis Difficulty walking;Abnormality of gait;Generalized weakness;Acute pain   PT Problem List Decreased strength;Decreased activity tolerance;Decreased balance;Cardiopulmonary status limiting activity;Pain;Decreased mobility;Decreased knowledge of use of DME  PT Treatment Interventions DME instruction;Gait training;Functional mobility training;Therapeutic activities;Therapeutic exercise;Balance training;Patient/family education   PT Goals (Current goals can be found in the Care Plan section) Acute Rehab PT Goals Patient Stated Goal: To get better before going home PT Goal Formulation: With patient Time For Goal Achievement: 02/28/16 Potential to Achieve Goals: Good    Frequency Min 3X/week   Barriers to discharge Decreased caregiver support Pt states that she has no one to help her at home.     Co-evaluation               End of Session  Equipment Utilized During Treatment: Gait belt Activity Tolerance: Patient limited by pain Patient left: in chair;with call bell/phone within reach;with chair alarm set;with nursing/sitter in room Nurse Communication: Mobility status;Patient requests pain meds         Time: [id number] PT Time Calculation (min) (ACUTE ONLY): 24 min   Charges:   PT Evaluation $PT Eval Moderate Complexity: 1 Procedure PT Treatments $Gait Training: 8-22 mins   PT G Codes:       Colon Branch, SPT Colon Branch 02/14/2016, 12:08 PM

## 2016-02-14 NOTE — Progress Notes (Addendum)
Pt transferred to [address] bed 27 per w/c with all belongings by NT Family accompanied pt upstairs Just prior to transfer Pt ambulated to BR - PT stated she  Had a medium  BM but did not allow staff to visualize and flushed - says she feels much better now.

## 2016-02-14 NOTE — Progress Notes (Signed)
D/C Heparin drip & started on Xarelto PO as per orders

## 2016-02-14 NOTE — Clinical Social Work Note (Signed)
Clinical Social Work Assessment  Patient Details  Name: Lauren Santana MRN: [id number] Date of Birth: Apr 16, 1951  Date of referral:  02/14/16               Reason for consult:  Discharge Planning, Facility Placement                Permission sought to share information with:  Case Manager, Family Supports, Customer service manager Permission granted to share information::  Yes, Verbal Permission Granted  Name::      (Lauren Santana)  Agency::     Relationship::   (SON)  Contact Information:   [phone])  Housing/Transportation Living arrangements for the past 2 months:  Apartment Source of Information:  Patient Patient Interpreter Needed:  None Criminal Activity/Legal Involvement Pertinent to Current Situation/Hospitalization:  No - Comment as needed Significant Relationships:  Adult Children Lauren Santana and Lauren Santana (sons)) Lives with:  Self Do you feel safe going back to the place where you live?  Yes Need for family participation in patient care:  Yes (Comment)  Care giving concerns:  Patient came in SOB. Patient has blood clots in lungs and legs.    Social Worker assessment / plan:  BSW intern entered patient room. Patient was alert and oriented with son Lauren Santana at beside. BSW intern explained PT recommendation and referral process. Patient refuses SNF at this time. Patient and patient son would like for the patient to return home. Depending on her medically stability when discharge patient said she might consider a facility if she declines while home.   Employment status:  Disabled (Comment on whether or not currently receiving Disability) Insurance information:  Managed Medicare PT Recommendations:  [place] / Referral to community resources:  Acute Rehab  Patient/Family's Response to care:  Patient has a good response to care even though she not agreeable to SNF she still considering it depending on her medically stability upon  discharging.   Patient/Family's Understanding of and Emotional Response to Diagnosis, Current Treatment, and Prognosis:  Patient son and patient has good insight of patient current conditions and treatment .  Emotional Assessment Appearance:  Appears stated age Attitude/Demeanor/Rapport:   (nice, welcoming, funny) Affect (typically observed):  Accepting, Overwhelmed, Hopeful Orientation:  Oriented to Self, Oriented to Place, Oriented to  Time, Oriented to Situation Alcohol / Substance use:  Never Used Psych involvement (Current and /or in the community):  No (Comment)  Discharge Needs  Concerns to be addressed:  No discharge needs identified Readmission within the last 30 days:  No Current discharge risk:  None Barriers to Discharge:  No Barriers Identified   [place] intern  [phone]  CSW reviewed above assessment and agrees with its findings  Domenica Reamer, [place] Worker [phone]

## 2016-02-14 NOTE — Progress Notes (Signed)
[facility] PROGRESS NOTE  Lauren Santana [id number] DOB: 1951-07-02 DOA: 02/12/2016  PCP: Elyn Peers, MD  Brief HPI: 65 year old African-American female with a past medical history of hypertension, hyperlipidemia, bilateral heel edema, endometriosis with on and off vaginal bleeding, presented with exertional dyspnea for 4 days. Patient is also noted to be obese. She was found to have acute pulmonary embolism. She was hospitalized for further management.  Past medical history:  Past Medical History  Diagnosis Date  . Hypertension   . High cholesterol   . Arthritis   . Wears glasses   . Numbness     B/LLE  . Edema     B/LLE    Consultants: None  Procedures:  Echocardiogram is pending  Antibiotics: None  Subjective: Patient Feels better this morning. Denies any chest pain, shortness of breath. No nausea, vomiting. No dizziness or lightheadedness.  Objective: Vital Signs  Filed Vitals:   02/14/16 0446 02/14/16 0448 02/14/16 0810 02/14/16 1007  BP:  114/68  105/63  Pulse:  82 112 123  Temp: 98.5 F (36.9 C)  98.2 F (36.8 C)   TempSrc: Oral  Oral   Resp:  16 21   Height:      Weight:      SpO2:  98%      Intake/Output Summary (Last 24 hours) at 02/14/16 1156 Last data filed at 02/14/16 1124  Gross per 24 hour  Intake   2397 ml  Output   1045 ml  Net   1352 ml   Filed Weights   02/13/16 0400  Weight: 112.9 kg (248 lb 14.4 oz)    General appearance: alert, cooperative, appears stated age and no distress Resp: Improved air entry bilaterally. No wheezing, rales or rhonchi. Cardio: regular rate and rhythm, S1, S2 normal, no murmur, click, rub or gallop GI: soft, non-tender; bowel sounds normal; no masses,  no organomegaly Extremities: Bilateral nonpitting edema is noted in the lower extremities Neurologic: Awake and alert. Oriented 3. No focal neurological deficits.  Lab Results:  Basic Metabolic Panel:  Recent Labs Lab  02/12/16 2041 02/12/16 2052 02/13/16 0455 02/14/16 0504  NA 139 138 137 138  K 4.0 3.8 3.8 4.6  CL 103 101 106 106  CO2 25  --  24 23  GLUCOSE 104* 96 91 97  BUN 18 19 11 9   CREATININE 1.18* 1.10* 1.04* 1.03*  CALCIUM 9.5  --  8.8* 8.3*   Liver Function Tests:  Recent Labs Lab 02/13/16 0455  AST 21  ALT 21  ALKPHOS 59  BILITOT 0.9  PROT 6.3*  ALBUMIN 2.9*   CBC:  Recent Labs Lab 02/12/16 2041 02/12/16 2052 02/14/16 0504  WBC 7.4  --  6.6  NEUTROABS 4.3  --   --   HGB 11.7* 12.6 10.5*  HCT 36.0 37.0 32.5*  MCV 97.8  --  98.8  PLT 87*  --  96*   Cardiac Enzymes:  Recent Labs Lab 02/13/16 0455 02/13/16 1517  TROPONINI 0.04*  0.04* 0.03   CBG:  Recent Labs Lab 02/13/16 0756 02/14/16 0808  GLUCAP 84 78    Recent Results (from the past 240 hour(s))  MRSA PCR Screening     Status: None   Collection Time: 02/13/16  4:05 AM  Result Value Ref Range Status   MRSA by PCR NEGATIVE NEGATIVE Final    Comment:        The GeneXpert MRSA Assay (FDA approved for NASAL specimens only), is one component of  a comprehensive MRSA colonization surveillance program. It is not intended to diagnose MRSA infection nor to guide or monitor treatment for MRSA infections.       Studies/Results: Ct Angio Chest Pe W/cm &/or Wo Cm  02/12/2016  CLINICAL DATA:  Shortness of breath, right leg swelling, and tachycardia for 2 days. EXAM: CT ANGIOGRAPHY CHEST WITH CONTRAST TECHNIQUE: Multidetector CT imaging of the chest was performed using the standard protocol during bolus administration of intravenous contrast. Multiplanar CT image reconstructions and MIPs were obtained to evaluate the vascular anatomy. CONTRAST:  119mL OMNIPAQUE IOHEXOL 350 MG/ML SOLN COMPARISON:  None. FINDINGS: Technically adequate study with good opacification of the central and segmental pulmonary arteries. Filling defects are demonstrated in the distal main pulmonary arteries bilaterally, extending into  multiple bilateral upper and lower lobe branches consistent with acute pulmonary embolus. RV to LV ratio is about 2, suggesting possible right heart strain. Normal heart size. Normal caliber thoracic aorta. No aortic dissection. Great vessel origins are patent. Scattered lymph nodes in the mediastinum are not pathologically enlarged are likely reactive. Small esophageal hiatal hernia. Esophagus is decompressed. Evaluation of lungs is limited due to respiratory motion artifact. Atelectasis is demonstrated in the lung bases. No focal airspace disease or consolidation. No pleural effusions. No pneumothorax. Included portions of the upper abdominal organs demonstrate a cyst in the left kidney. Degenerative changes in the spine. No destructive bone lesions. Review of the MIP images confirms the above findings. IMPRESSION: Bilateral central and segmental pulmonary emboli with increased RV to LV ratio suggesting right heart strain. These results were called by telephone at the time of interpretation on 02/12/2016 at 11:35 pm to Dr. Tanna Furry , who verbally acknowledged these results. Electronically Signed   By: Lucienne Capers M.D.   On: 02/12/2016 23:36    Medications:  Scheduled: . atorvastatin  10 mg Oral q1800  . gabapentin  300 mg Oral TID  . labetalol  100 mg Oral BID  . Rivaroxaban  15 mg Oral BID WC  . [START ON 03/06/2016] rivaroxaban  20 mg Oral Q supper  . sodium chloride flush  3 mL Intravenous Q12H  . traMADol  50 mg Oral BID   Continuous: . sodium chloride 10 mL/hr (02/14/16 1011)   [id number], hydrALAZINE, morphine injection, ondansetron  Assessment/Plan:  Principal Problem:   PE (pulmonary embolism) Active Problems:   Endometriosis of pelvis   Essential hypertension, benign   Edema   Dyslipidemia   Morbid obesity with BMI of 40.0-44.9, adult (HCC)   Pelvic mass in female   Pulmonary emboli (HCC)   AKI (acute kidney injury) ([place])    Acute PE (pulmonary embolism) and  RLE DVT Patient's shortness of breath and mild chest pain was caused by acute pulmonary embolism as shown by CT angiogram. Patient noted to have right heart strain but remains hemodynamically stable. Echocardiogram is pending. Lower extremity Dopplers to reveal right lower extremity DVT in the femoral and popliteal veins. She has risk factors for thromboembolism including obesity, decreased mobility. No obvious known history of cancer. Plan is to transition to oral anticoagulation today. Discussed with the patient.  Endometriosis of pelvis Followed by OB/Gyn, Dr. Benjie Karvonen. Seen by her GYN 3/21 and given Lupron. Patient has not had any bleeding. Discussed with Dr. Benjie Karvonen yesterday. She does not feel that her endometriosis will pose any problems with anticoagulation. At the same time, patient should watch out for signs of bleeding. Unfortunately, the area of endometriosis cannot be surgically excised.  Essential HTN Monitor blood pressures closely. Judicious use of diuretics   Hyperlipidemia  Continue home medications: Lipitor  Mild Acute kidney injury  Likely due to prerenal secondary to dehydration and continuation of ACEI and diruetics. Renal function is improved. Continue to monitor for now.  Chronic bilateral leg edema Patient has venous insufficiency change in lower legs, which is likely the reason. Also noted to have right leg DVT. Holding her diuretics. Echocardiogram is pending.  Thrombocytopenia Platelet counts are low. They have been low in the past as well. Etiology is unclear. Could be due to consumption in the acute setting. Monitor closely. Counts are stable.   DVT Prophylaxis: On IV heparin. Transition to Xarelto.  Code Status: Full code  Family Communication: Discussed with the patient  Disposition Plan: Await echocardiogram. Transfer to telemetry floor. Mobilize. Anticipate discharge tomorrow.    LOS: 1 day   [place] [facility] Pager [phone] 02/14/2016,  11:56 AM  If 7PM-7AM, please contact night-coverage at [email or web address], password [facility]

## 2016-02-15 ENCOUNTER — Inpatient Hospital Stay (HOSPITAL_COMMUNITY): Payer: PPO

## 2016-02-15 DIAGNOSIS — I2699 Other pulmonary embolism without acute cor pulmonale: Secondary | ICD-10-CM

## 2016-02-15 DIAGNOSIS — R000 Tachycardia, unspecified: Secondary | ICD-10-CM | POA: Insufficient documentation

## 2016-02-15 DIAGNOSIS — I82411 Acute embolism and thrombosis of right femoral vein: Secondary | ICD-10-CM

## 2016-02-15 LAB — CBC
HCT: 31.9 % — ABNORMAL LOW (ref 36.0–46.0)
Hemoglobin: 10.3 g/dL — ABNORMAL LOW (ref 12.0–15.0)
MCH: 31.5 pg (ref 26.0–34.0)
MCHC: 32.3 g/dL (ref 30.0–36.0)
MCV: 97.6 fL (ref 78.0–100.0)
Platelets: 115 10*3/uL — ABNORMAL LOW (ref 150–400)
RBC: 3.27 MIL/uL — ABNORMAL LOW (ref 3.87–5.11)
RDW: 13.9 % (ref 11.5–15.5)
WBC: 6.4 10*3/uL (ref 4.0–10.5)

## 2016-02-15 LAB — BASIC METABOLIC PANEL
Anion gap: 9 (ref 5–15)
BUN: 10 mg/dL (ref 6–20)
CO2: 21 mmol/L — ABNORMAL LOW (ref 22–32)
Calcium: 8.6 mg/dL — ABNORMAL LOW (ref 8.9–10.3)
Chloride: 109 mmol/L (ref 101–111)
Creatinine, Ser: 0.85 mg/dL (ref 0.44–1.00)
GFR calc Af Amer: >60 mL/min (ref >60)
GFR calc non Af Amer: >60 mL/min (ref >60)
Glucose, Bld: 93 mg/dL (ref 65–99)
Potassium: 4.4 mmol/L (ref 3.5–5.1)
Sodium: 139 mmol/L (ref 135–145)

## 2016-02-15 LAB — ECHOCARDIOGRAM COMPLETE
Height: 62 in
Weight: 4123.48 oz

## 2016-02-15 LAB — TSH: TSH: 0.948 u[IU]/mL (ref 0.350–4.500)

## 2016-02-15 MED ORDER — METOPROLOL TARTRATE 1 MG/ML IV SOLN
5.0000 mg | Freq: Once | INTRAVENOUS | Status: AC
  Administered 2016-02-15: 5 mg via INTRAVENOUS
  Filled 2016-02-15: qty 5

## 2016-02-15 MED ORDER — FUROSEMIDE 20 MG PO TABS
20.0000 mg | ORAL_TABLET | Freq: Every day | ORAL | Status: DC
  Administered 2016-02-15 – 2016-02-16 (×2): 20 mg via ORAL
  Filled 2016-02-15 (×2): qty 1

## 2016-02-15 NOTE — Progress Notes (Signed)
[facility] PROGRESS NOTE  Lauren Santana [id number] DOB: 06-21-1951 DOA: 02/12/2016  PCP: Lauren Peers, MD  Brief HPI: 65 year old African-American female with a past medical history of hypertension, hyperlipidemia, bilateral heel edema, endometriosis with on and off vaginal bleeding, presented with exertional dyspnea for 4 days. Patient is also noted to be obese. She was found to have acute pulmonary embolism. She was hospitalized for further management.  Past medical history:  Past Medical History  Diagnosis Date  . Hypertension   . High cholesterol   . Arthritis   . Wears glasses   . Numbness     B/LLE  . Edema     B/LLE    Consultants: Cardiology  Procedures:  Echocardiogram Study Conclusions - Left ventricle: The cavity size was normal. Wall thickness was increased in a pattern of moderate LVH. Systolic function was vigorous. The estimated ejection fraction was in the range of 65% to 70%. Wall motion was normal; there were no regional wall motion abnormalities. The study is not technically sufficient to allow evaluation of LV diastolic function. - Right ventricle: Poorly visualized. The cavity size was mildly dilated. Wall thickness was normal. Systolic function was moderately reduced. - Pulmonary arteries: Systolic pressure was mildly increased.  Antibiotics: None  Subjective: Patient is much better. Denies any chest pain or shortness of breath. No nausea or vomiting. Denies any palpitations. No vaginal bleeding.   Objective: Vital Signs  Filed Vitals:   02/14/16 1915 02/14/16 2109 02/15/16 0558 02/15/16 1345  BP: 132/61 109/49 110/70 116/59  Pulse: 122 115 105 114  Temp:  99.1 F (37.3 C) 98.9 F (37.2 C) 98.4 F (36.9 C)  TempSrc:  Oral Oral Oral  Resp:  18 18 18   Height:      Weight:      SpO2:  100% 99% 100%    Intake/Output Summary (Last 24 hours) at 02/15/16 1347 Last data filed at 02/15/16 1005  Gross per 24 hour  Intake    780  ml  Output    200 ml  Net    580 ml   Filed Weights   02/13/16 0400 02/14/16 1626  Weight: 112.9 kg (248 lb 14.4 oz) 116.9 kg (257 lb 11.5 oz)    General appearance: alert, cooperative, appears stated age and no distress Resp: Clear to auscultation bilaterally Cardio: S1, S2 is tachycardic, regular. No S3, S4. No rubs, or bruit. Minimal pedal edema GI: soft, non-tender; bowel sounds normal; no masses,  no organomegaly Extremities: Moderate edema noted in the right lower extremity today. Pitting. Neurologic: Awake and alert. Oriented 3. No focal neurological deficits.  Lab Results:  Basic Metabolic Panel:  Recent Labs Lab 02/12/16 2041 02/12/16 2052 02/13/16 0455 02/14/16 0504 02/15/16 0805  NA 139 138 137 138 139  K 4.0 3.8 3.8 4.6 4.4  CL 103 101 106 106 109  CO2 25  --  24 23 21*  GLUCOSE 104* 96 91 97 93  BUN 18 19 11 9 10   CREATININE 1.18* 1.10* 1.04* 1.03* 0.85  CALCIUM 9.5  --  8.8* 8.3* 8.6*   Liver Function Tests:  Recent Labs Lab 02/13/16 0455  AST 21  ALT 21  ALKPHOS 59  BILITOT 0.9  PROT 6.3*  ALBUMIN 2.9*   CBC:  Recent Labs Lab 02/12/16 2041 02/12/16 2052 02/14/16 0504 02/15/16 0805  WBC 7.4  --  6.6 6.4  NEUTROABS 4.3  --   --   --   HGB 11.7* 12.6 10.5* 10.3*  HCT 36.0 37.0 32.5* 31.9*  MCV 97.8  --  98.8 97.6  PLT 87*  --  96* 115*   Cardiac Enzymes:  Recent Labs Lab 02/13/16 0455 02/13/16 1517  TROPONINI 0.04*  0.04* 0.03   CBG:  Recent Labs Lab 02/13/16 0756 02/14/16 0808  GLUCAP 84 78    Recent Results (from the past 240 hour(s))  MRSA PCR Screening     Status: None   Collection Time: 02/13/16  4:05 AM  Result Value Ref Range Status   MRSA by PCR NEGATIVE NEGATIVE Final    Comment:        The GeneXpert MRSA Assay (FDA approved for NASAL specimens only), is one component of a comprehensive MRSA colonization surveillance program. It is not intended to diagnose MRSA infection nor to guide or monitor  treatment for MRSA infections.       Studies/Results: No results found.  Medications:  Scheduled: . atorvastatin  10 mg Oral q1800  . furosemide  20 mg Oral Daily  . gabapentin  300 mg Oral TID  . labetalol  100 mg Oral BID  . Rivaroxaban  15 mg Oral BID WC  . [START ON 03/06/2016] rivaroxaban  20 mg Oral Q supper  . sodium chloride flush  3 mL Intravenous Q12H  . traMADol  50 mg Oral BID   Continuous:   [id number], morphine injection, ondansetron  Assessment/Plan:  Principal Problem:   PE (pulmonary embolism) Active Problems:   Endometriosis of pelvis   Essential hypertension, benign   Edema   Dyslipidemia   Morbid obesity with BMI of 40.0-44.9, adult (HCC)   Pelvic mass in female   Pulmonary emboli (HCC)   AKI (acute kidney injury) ([place])   Acute deep vein thrombosis (DVT) of right lower extremity (HCC)    Acute PE (pulmonary embolism) and RLE DVT Patient's shortness of breath and mild chest pain was caused by acute pulmonary embolism as shown by CT angiogram. Patient noted to have right heart strain. Echocardiogram is as above. Lower extremity Dopplers to reveal right lower extremity DVT in the femoral and popliteal veins. She has risk factors for thromboembolism including obesity, decreased mobility. No obvious known history of cancer. Patient was transitioned to [place] on 3/23.   Tachyarrhythmia Yesterday afternoon patient's heart rate suddenly jumped from 80-90, which was sinus rhythm to 120. EKG was obtained last night shows a junctional tachycardia with possible reentry. Discussed with Dr. Harrington Challenger with cardiology. She will consult on this patient. She recommends continuing labetalol for now and repeat EKG this evening  Endometriosis of pelvis Followed by OB/Gyn, Dr. Benjie Santana. Seen by her GYN 3/21 and given Lupron. Patient has not had any bleeding. Discussed with Dr. Benjie Santana 3/22. She does not feel that her endometriosis will pose any problems with  anticoagulation. At the same time, patient should watch out for signs of bleeding. Unfortunately, the area of endometriosis cannot be surgically excised.   Essential HTN Monitor blood pressures closely.   Hyperlipidemia  Continue home medications: Lipitor  Mild Acute kidney injury  Likely due to prerenal secondary to dehydration and continuation of ACEI and diruetics. Renal function is improved. Continue to monitor for now.  Chronic bilateral leg edema Patient has venous insufficiency change in lower legs, which is likely the reason. Also noted to have right leg DVT. More edematous today compared to yesterday. Start Lasix at a low dose. Patient states that she is not able to tolerate her home Torsemide. This will have to be discontinued and  substituted with Lasix.   Thrombocytopenia Platelet counts are low. They have been low in the past as well. Etiology is unclear. Could be due to consumption in the acute setting. Monitor closely. Counts noted to be improved this morning.   DVT Prophylaxis: Xarelto.  Code Status: Full code  Family Communication: Discussed with the patient and her son Disposition Plan: Await cardiology input. Continue to mobilize. Patient does not want to go to a skilled nursing facility.    LOS: 2 days   [place] [facility] Pager [phone] 02/15/2016, 1:47 PM  If 7PM-7AM, please contact night-coverage at [email or web address], password [facility]

## 2016-02-15 NOTE — Consult Note (Signed)
Primary Physician: Primary Cardiologist:  New    Asked to see re Tachycardia  HPI:  Pt is a 65 yo with history of HTN, HL, obesity.  She presented to hosp on 3/22 with complaints of SOB  Found to have a pulmonary embolus   Yesterday she developed tachycardia that has persisted to today  The patient denies palpitations  No CP  Denies SOB at rest. No dizziness.  EKG on 3/21 SR 95 bpm   On 3/23 Short RP tachycardia, probably atrial tachycardia.  121 bpm  Review of telemetry this ahs been at fairly constant rate    Echo done today Mod LVH  LVEF 65%  RV appears mildly dilated with moderately deecreased function  Difficult study    Past Medical History  Diagnosis Date  . Hypertension   . High cholesterol   . Arthritis   . Wears glasses   . Numbness     B/LLE  . Edema     B/LLE    Medications Prior to Admission  Medication Sig Dispense Refill  . atorvastatin (LIPITOR) 10 MG tablet TAKE ONE TABLET BY MOUTH ONCE DAILY IN THE EVENING (Patient taking differently: Take 10 mg by mouth daily at 6 PM. ) 30 tablet 5  . diphenhydrAMINE (BENADRYL) 25 MG tablet Take 25 mg by mouth every 6 (six) hours as needed for itching or allergies.    Marland Kitchen gabapentin (NEURONTIN) 300 MG capsule Take 300 mg by mouth 3 (three) times daily.    Marland Kitchen KLOR-CON M10 10 MEQ tablet TAKE ONE TABLET BY MOUTH ONCE DAILY (Patient taking differently: TAKE 10 MEQ BY MOUTH ONCE DAILY) 30 tablet 0  . labetalol (NORMODYNE) 100 MG tablet Take 100 mg by mouth 2 (two) times daily.    Marland Kitchen lisinopril (PRINIVIL,ZESTRIL) 10 MG tablet Take 10 mg by mouth at bedtime.     . torsemide (DEMADEX) 20 MG tablet Take 20 mg by mouth daily.    . traMADol (ULTRAM) 50 MG tablet Take 50 mg by mouth 2 (two) times daily.  0     . atorvastatin  10 mg Oral q1800  . furosemide  20 mg Oral Daily  . gabapentin  300 mg Oral TID  . labetalol  100 mg Oral BID  . Rivaroxaban  15 mg Oral BID WC  . [START ON 03/06/2016] rivaroxaban  20 mg Oral Q supper  .  sodium chloride flush  3 mL Intravenous Q12H  . traMADol  50 mg Oral BID    Infusions:    Allergies  Allergen Reactions  . Tylenol [Acetaminophen] Other (See Comments)    States "seems like eats my stomach"  . Sulfa Antibiotics Other (See Comments)    Unknown allergic reaction    Social History   Social History  . Marital Status: Divorced    Spouse Name: N/A  . Number of Children: N/A  . Years of Education: N/A   Occupational History  . Not on file.   Social History Main Topics  . Smoking status: Never Smoker   . Smokeless tobacco: Never Used  . Alcohol Use: No  . Drug Use: No  . Sexual Activity: Not Currently   Other Topics Concern  . Not on file   Social History Narrative    Family History  Problem Relation Age of Onset  . Colon cancer Sister   . Cancer Sister     unsure of type of cancer    REVIEW OF SYSTEMS:  All systems reviewed  Negative to the above problem except as noted above.    PHYSICAL EXAM: Filed Vitals:   02/15/16 0558 02/15/16 1345  BP: 110/70 116/59  Pulse: 105 114  Temp: 98.9 F (37.2 C) 98.4 F (36.9 C)  Resp: 18 18     Intake/Output Summary (Last 24 hours) at 02/15/16 1603 Last data filed at 02/15/16 1513  Gross per 24 hour  Intake    600 ml  Output    700 ml  Net   -100 ml    General: Obese 65 yo in NAD  No respiratory difficulty   HEENT: normal Neck: supple. no JVD. Carotids 2+ bilat; no bruits. No lymphadenopathy or thryomegaly appreciated. Cor: PMI nondisplaced. Regular rate & rhythm. No rubs, gallops or murmurs. Lungs: clear Abdomen: soft, nontender, nondistended. No hepatosplenomegaly. No bruits or masses. Good bowel sounds. Extremities: no cyanosis, clubbing, rash,   1+ edema Neuro: alert & oriented x 3, cranial nerves grossly intact. moves all 4 extremities w/o difficulty. Affect pleasant.  ECG:  As noted above    Results for orders placed or performed during the hospital encounter of 02/12/16 (from the past 24  hour(s))  CBC     Status: Abnormal   Collection Time: 02/15/16  8:05 AM  Result Value Ref Range   WBC 6.4 4.0 - 10.5 K/uL   RBC 3.27 (L) 3.87 - 5.11 MIL/uL   Hemoglobin 10.3 (L) 12.0 - 15.0 g/dL   HCT 31.9 (L) 36.0 - 46.0 %   MCV 97.6 78.0 - 100.0 fL   MCH 31.5 26.0 - 34.0 pg   MCHC 32.3 30.0 - 36.0 g/dL   RDW 13.9 11.5 - 15.5 %   Platelets 115 (L) 150 - 400 K/uL  Basic metabolic panel     Status: Abnormal   Collection Time: 02/15/16  8:05 AM  Result Value Ref Range   Sodium 139 135 - 145 mmol/L   Potassium 4.4 3.5 - 5.1 mmol/L   Chloride 109 101 - 111 mmol/L   CO2 21 (L) 22 - 32 mmol/L   Glucose, Bld 93 65 - 99 mg/dL   BUN 10 6 - 20 mg/dL   Creatinine, Ser 0.85 0.44 - 1.00 mg/dL   Calcium 8.6 (L) 8.9 - 10.3 mg/dL   GFR calc non Af Amer >60 >60 mL/min   GFR calc Af Amer >60 >60 mL/min   Anion gap 9 5 - 15  TSH     Status: None   Collection Time: 02/15/16 12:00 PM  Result Value Ref Range   TSH 0.948 0.350 - 4.500 uIU/mL   No results found.   ASSESSMENT:  Pt  Is a 65 yo admitted with SOB  Found to have pulmonary embolus.  EKG on admit SR  Yesterday developed probable ectopic atrial tachycardia    Reviewed with EP   Pt asymptomatic   BP has been 100s to 130s  HR 110s to 120s   The above rhythm most likely brought on by strain to RA/RV after PE  Currently on po b blocker  Would try IV and follow  COnsider trial of adenosine if fails to slow (more for diagnosis)  This should resolve as pt recovers from PE.  WIll continue to follow.

## 2016-02-15 NOTE — Care Management Note (Signed)
Case Management Note  Patient Details  Name: Lauren Santana MRN: [id number] Date of Birth: 11-02-1951  Subjective/Objective:                 Met with patient and son in room. Patient and son Lauren Santana [phone]) agree to [facility] with [facility], Referral made for PT OT. Lauren Santana states that family will be available to provide 24 hour supervision. Lauren Santana given information on [employer], and patient was previously provided 30 day coupon for Xarelto (at bedside). Patient and son decline any DME needs.  Action/Plan:  Anticipate weekend DC to home with [facility] provided through [facility].  Expected Discharge Date:                  Expected Discharge Plan:  [place]  In-House Referral:  Clinical Social Work  Discharge planning Services  CM Consult, Medication Assistance  Post Acute Care Choice:  Home Health Choice offered to:  Patient, Adult Children  DME Arranged:    DME Agency:     HH Arranged:  PT, OT HH Agency:  [place]  Status of Service:  Completed, signed off  Medicare Important Message Given:  Yes Date Medicare IM Given:    Medicare IM give by:    Date Additional Medicare IM Given:    Additional Medicare Important Message give by:     If discussed at [place] of Stay Meetings, dates discussed:    Additional Comments:  Carles Collet, RN 02/15/2016, 2:37 PM

## 2016-02-15 NOTE — Progress Notes (Signed)
  Echocardiogram 2D Echocardiogram has been performed.  Lauren Santana 02/15/2016, 10:58 AM

## 2016-02-15 NOTE — Care Management Important Message (Signed)
Important Message  Patient Details  Name: Lauren Santana MRN: [id number] Date of Birth: 1951-02-06   Medicare Important Message Given:  Yes    Barb Merino Davone Shinault 02/15/2016, 12:51 PM

## 2016-02-15 NOTE — Progress Notes (Signed)
PT Cancellation Note  Patient Details Name: Lauren Santana MRN: [id number] DOB: 1951/07/22   Cancelled Treatment:    Reason Eval/Treat Not Completed: Patient at procedure or test/unavailable; pt out of room. Will attempt again later.   Reginia Naas 02/15/2016, 10:33 AM  Magda Kiel, [place] 02/15/2016

## 2016-02-15 NOTE — Evaluation (Signed)
Occupational Therapy Evaluation Patient Details Name: Lauren Santana MRN: [id number] DOB: 10-09-1951 Today's Date: 02/15/2016    History of Present Illness Lauren Santana is a 65 y.o. female with PMH of hypertension, hyperlipidemia, bilateral lower leg edema, endometriosis (s/p of TAH and BSO) who presents with shortness of breath and right worsening leg edema. CTA showed bilateral central and segmental pulmonary emboli with increased RV to LV ratio suggesting right heart strain. Venous Dopplers do show DVT in the right leg.    Clinical Impression   Pt was functioning at a modified independent level prior to admission. Pt present with generalized weakness, impaired balance and decreased activity tolerance. Educated in energy conservation strategies and gave pt handout. Pt overall functioning at a min guard to supervision level in ADL and ADL transfers.  Will follow acutely. Pt to go home with son coordinating 24 hour supervision of family members.     Follow Up Recommendations  No OT follow up    Equipment Recommendations  None recommended by OT    Recommendations for Other Services       Precautions / Restrictions Precautions Precautions: Fall Restrictions Weight Bearing Restrictions: No      Mobility Bed Mobility               General bed mobility comments: pt in chair  Transfers Overall transfer level: Needs assistance   Transfers: Sit to/from Stand Sit to Stand: Modified independent (Device/Increase time)         General transfer comment: no physical assist from 3 in 1 or recliner    Balance     Sitting balance-Leahy Scale: Good       Standing balance-Leahy Scale: Fair                              ADL Overall ADL's : Needs assistance/impaired Eating/Feeding: Independent;Sitting   Grooming: Supervision/safety;Standing;Wash/dry hands   Upper Body Bathing: Set up;Sitting   Lower Body Bathing: Supervison/ safety;Sit to/from  stand   Upper Body Dressing : Set up;Sitting   Lower Body Dressing: Supervision/safety;Sit to/from stand   Toilet Transfer: Min guard;Ambulation;BSC   Toileting- Clothing Manipulation and Hygiene: Minimal assistance;Sit to/from stand Toileting - Clothing Manipulation Details (indicate cue type and reason): assist for gown     Functional mobility during ADLs: Min guard (within room) General ADL Comments: Educated in energy conservation and provided handout.     Vision     Perception     Praxis      Pertinent Vitals/Pain Faces Pain Scale: Hurts a little bit Pain Location: LEs Pain Intervention(s): Monitored during session     Hand Dominance Right   Extremity/Trunk Assessment Upper Extremity Assessment Upper Extremity Assessment: Overall WFL for tasks assessed   Lower Extremity Assessment Lower Extremity Assessment: Defer to PT evaluation       Communication Communication Communication: No difficulties   Cognition Arousal/Alertness: Awake/alert Behavior During Therapy: WFL for tasks assessed/performed Overall Cognitive Status: Within Functional Limits for tasks assessed                     General Comments       Exercises       Shoulder Instructions      Home Living Family/patient expects to be discharged to:: Private residence Living Arrangements: Alone Available Help at Discharge: Family;Available PRN/intermittently (son is working out 24 hour supervision) Type of Home: Apartment Home Access: Level entry  Home Layout: One level     Bathroom Shower/Tub: Teacher, early years/pre: Handicapped height     Home Equipment: Environmental consultant - 2 wheels;Tub bench;Cane - single point;Grab bars - tub/shower          Prior Functioning/Environment Level of Independence: Independent with assistive device(s)        Comments: Pt ambulate with a cane in her home, uses a rollator to carry in her groceries or take her garbage.    OT Diagnosis:  Generalized weakness;Acute pain   OT Problem List: Impaired balance (sitting and/or standing);Obesity;Pain;Cardiopulmonary status limiting activity   OT Treatment/Interventions: Energy conservation;Self-care/ADL training;Patient/family education    OT Goals(Current goals can be found in the care plan section) Acute Rehab OT Goals Patient Stated Goal: To get better before going home OT Goal Formulation: With patient Time For Goal Achievement: 02/22/16 Potential to Achieve Goals: Good ADL Goals Pt Will Perform Grooming: with modified independence;standing (3 activities) Pt Will Perform Lower Body Bathing: with modified independence;sit to/from stand Pt Will Perform Lower Body Dressing: with modified independence;sit to/from stand Pt Will Transfer to Toilet: with modified independence;ambulating;bedside commode (over toilet) Pt Will Perform Toileting - Clothing Manipulation and hygiene: with modified independence;sit to/from stand Additional ADL Goal #1: Pt will state at least 3 energy conservation strategies.  OT Frequency: Min 2X/week   Barriers to D/C:            Co-evaluation              End of Session Equipment Utilized During Treatment: Gait belt  Activity Tolerance: Patient tolerated treatment well Patient left: in chair;with call bell/phone within reach;with chair alarm set;with family/visitor present   Time: 1348-1405 OT Time Calculation (min): 17 min Charges:  OT General Charges $OT Visit: 1 Procedure OT Evaluation $OT Eval Moderate Complexity: 1 Procedure G-Codes:    Malka So 02/15/2016, 2:48 PM  [phone]

## 2016-02-15 NOTE — Progress Notes (Signed)
Physical Therapy Treatment Patient Details Name: QUINTERIA WOODMANSEE MRN: [id number] DOB: 24-Aug-1951 Today's Date: 02/15/2016    History of Present Illness Lauren Santana is a 65 y.o. female with PMH of hypertension, hyperlipidemia, bilateral lower leg edema, endometriosis (s/p of TAH and BSO) who presents with shortness of breath and right worsening leg edema. CTA showed bilateral central and segmental pulmonary emboli with increased RV to LV ratio suggesting right heart strain. Venous Dopplers do show DVT in the right leg.     PT Comments    Patient tolerating ambulation and standing therex this session, though R LE noted to be still very swollen.  Son plans to obtain stockings, pt states it is due to the "stroke in that leg" as well as "I eat too much salt".  Should be safe for d/c home with son and HHPT.  Follow Up Recommendations  Home health PT     Equipment Recommendations  Other (comment) (rollator)    Recommendations for Other Services       Precautions / Restrictions Precautions Precautions: Fall Precaution Comments: watch HR Restrictions Weight Bearing Restrictions: No    Mobility  Bed Mobility Overal bed mobility: Modified Independent             General bed mobility comments: pt in chair  Transfers Overall transfer level: Needs assistance Equipment used: Rolling walker (2 wheeled) Transfers: Sit to/from Stand Sit to Stand: Supervision         General transfer comment: assist from EOB for safety/stability  Ambulation/Gait Ambulation/Gait assistance: Supervision Ambulation Distance (Feet): 150 Feet Assistive device: None;Rolling walker (2 wheeled) Gait Pattern/deviations: Step-through pattern;Decreased stride length;Trunk flexed     General Gait Details: demonstrates increase proximity from walker, used to rollator, but in the home reports no device, able to walk around bed no device with supervision and increased antalgic pattern   Stairs            Wheelchair Mobility    Modified Rankin (Stroke Patients Only)       Balance Overall balance assessment: Needs assistance   Sitting balance-Leahy Scale: Good     Standing balance support: No upper extremity supported Standing balance-Leahy Scale: Fair                      Cognition Arousal/Alertness: Awake/alert Behavior During Therapy: WFL for tasks assessed/performed Overall Cognitive Status: History of cognitive impairments - at baseline                      Exercises Other Exercises Other Exercises: sit<>stand x 5 Other Exercises: stand marches x 5 Other Exercises: standing hip abduction x 5    General Comments General comments (skin integrity, edema, etc.): son in the room and noting she has to get compression stockings; educated where to obtain      Pertinent Vitals/Pain Faces Pain Scale: Hurts whole lot Pain Location: R > L LE with ambulation Pain Descriptors / Indicators: Sore Pain Intervention(s): Monitored during session;Repositioned    Home Living Family/patient expects to be discharged to:: Private residence Living Arrangements: Alone Available Help at Discharge: Family;Available PRN/intermittently (son is working out 24 hour supervision) Type of Home: Apartment Home Access: Level entry   Home Layout: One level Home Equipment: Environmental consultant - 2 wheels;Tub bench;Cane - single point;Grab bars - tub/shower      Prior Function Level of Independence: Independent with assistive device(s)      Comments: Pt ambulate with a cane in her home,  uses a rollator to carry in her groceries or take her garbage.   PT Goals (current goals can now be found in the care plan section) Acute Rehab PT Goals Patient Stated Goal: To get better before going home Progress towards PT goals: Progressing toward goals    Frequency  Min 3X/week    PT Plan Current plan remains appropriate    Co-evaluation             End of Session Equipment  Utilized During Treatment: Gait belt Activity Tolerance: Patient tolerated treatment well Patient left: in bed;with call bell/phone within reach     Time: 1546-1606 PT Time Calculation (min) (ACUTE ONLY): 20 min  Charges:  $Gait Training: 8-22 mins                    G Codes:      Reginia Naas 19-Feb-2016, 5:34 PM  Magda Kiel, [place] 19-Feb-2016

## 2016-02-15 NOTE — Progress Notes (Signed)
CSW received consult regarding PT recommendation of SNF at discharge.  Patient is refusing SNF and will return home with son.  CSW signing off.   Lauren Santana LCSWA [phone]

## 2016-02-16 MED ORDER — LISINOPRIL 10 MG PO TABS: 10.0000 mg | ORAL_TABLET | Freq: Every day | ORAL | Status: DC

## 2016-02-16 MED ORDER — RIVAROXABAN (XARELTO) VTE STARTER PACK (15 & 20 MG)
ORAL_TABLET | ORAL | Status: DC
Start: 2016-02-16 — End: 2019-06-09

## 2016-02-16 MED ORDER — RIVAROXABAN 20 MG PO TABS: 20.0000 mg | ORAL_TABLET | Freq: Every day | ORAL | Status: DC

## 2016-02-16 MED ORDER — FUROSEMIDE 20 MG PO TABS: 20.0000 mg | ORAL_TABLET | Freq: Every day | ORAL | Status: DC

## 2016-02-16 NOTE — Discharge Summary (Signed)
[facility]  Physician Discharge Summary   Patient ID: Lauren Santana MRN: [id number] DOB/AGE: [id number] y.o.  Admit date: 02/12/2016 Discharge date: 02/16/2016  PCP: Elyn Peers, MD  DISCHARGE DIAGNOSES:  Principal Problem:   PE (pulmonary embolism) Active Problems:   Endometriosis of pelvis   Essential hypertension, benign   Edema   Dyslipidemia   Morbid obesity with BMI of 40.0-44.9, adult (HCC)   Pelvic mass in female   Pulmonary emboli (HCC)   AKI (acute kidney injury) ([place])   Acute deep vein thrombosis (DVT) of right lower extremity (HCC)   Tachycardia   RECOMMENDATIONS FOR OUTPATIENT FOLLOW UP: 1. Patient to follow-up with her PCP before the end of this coming week 2. Home health has been ordered   DISCHARGE CONDITION: fair  Diet recommendation: As before  Filed Weights   02/13/16 0400 02/14/16 1626  Weight: 112.9 kg (248 lb 14.4 oz) 116.9 kg (257 lb 11.5 oz)    INITIAL HISTORY: 65 year old African-American female with a past medical history of hypertension, hyperlipidemia, bilateral heel edema, endometriosis with on and off vaginal bleeding, presented with exertional dyspnea for 4 days. Patient is also noted to be obese. She was found to have acute pulmonary embolism. She was hospitalized for further management.  Consultations:  Cardiology  Procedures: Echocardiogram Study Conclusions - Left ventricle: The cavity size was normal. Wall thickness was increased in a pattern of moderate LVH. Systolic function was vigorous. The estimated ejection fraction was in the range of 65% to 70%. Wall motion was normal; there were no regional wall motion abnormalities. The study is not technically sufficient to allow evaluation of LV diastolic function. - Right ventricle: Poorly visualized. The cavity size was mildly dilated. Wall thickness was normal. Systolic function was moderately reduced. - Pulmonary arteries: Systolic pressure was mildly  increased.  Lower extremity venous Dopplers Positive for DVT in the right lower extremity  HOSPITAL COURSE:   Acute PE (pulmonary embolism) and RLE DVT Patient's shortness of breath and mild chest pain was caused by acute pulmonary embolism as shown by CT angiogram. Patient noted to have right heart strain. Echocardiogram is as above. Lower extremity Dopplers to reveal right lower extremity DVT in the femoral and popliteal veins. She has risk factors for thromboembolism including obesity, decreased mobility. No obvious known history of cancer. Patient was transitioned to [place] on 3/23.   Tachyarrhythmia On 3/23 patient's heart rate suddenly jumped from 80-90, which was sinus rhythm, to 120. EKG was obtained which revealed a junctional tachycardia with possible reentry. Patient was seen by cardiology. It was felt the patient had a probable ectopic atrial tachycardia. This is thought to be secondary to patient's acute pulmonary embolism. Plan was to give a dose of intravenous beta blocker. However, before this could be given, patient spontaneously converted to sinus rhythm. Patient is already on labetalol at home, which will be continued. Discussed with Dr. Johnsie Cancel this morning. He does not recommend any new treatments at this time. Patient remains in sinus rhythm. Patient has been asymptomatic throughout this episode.   Endometriosis of pelvis Followed by OB/Gyn, Dr. Benjie Karvonen. Seen by her GYN 3/21 and given Lupron. Patient has not had any bleeding. Discussed with Dr. Benjie Karvonen 3/22. She does not feel that her endometriosis will pose any problems with anticoagulation. At the same time, patient should watch out for signs of bleeding. Unfortunately, the area of endometriosis cannot be surgically excised.   Essential HTN Monitor blood pressures closely.   Hyperlipidemia  Continue home  medications: Lipitor  Mild Acute kidney injury  Likely due to prerenal secondary to dehydration and continuation of ACEI  and diruetics. Renal function is improved. Continue to monitor for now.  Chronic bilateral leg edema Patient has venous insufficiency change in lower legs, which is likely the reason. Also noted to have right leg DVT. More edematous yesterday. Patient was started on Lasix at a low dose. Patient states that she is not able to tolerate her home Torsemide. This will have to be discontinued and substituted with Lasix.   Thrombocytopenia Platelet counts are low. They have been low in the past as well. Etiology is unclear. Could be due to consumption in the acute setting. Monitor closely. Counts noted to be improved 3/24.  Overall improved. Patient feels much better. Wishes to go home. She remains in sinus rhythm this morning. Okay for discharge home. Physical therapy had initially recommended SNF. Patient declined. She was reevaluated yesterday and now home health as recommended, which will be ordered.    PERTINENT LABS:  The results of significant diagnostics from this hospitalization (including imaging, microbiology, ancillary and laboratory) are listed below for reference.    Microbiology: Recent Results (from the past 240 hour(s))  MRSA PCR Screening     Status: None   Collection Time: 02/13/16  4:05 AM  Result Value Ref Range Status   MRSA by PCR NEGATIVE NEGATIVE Final    Comment:        The GeneXpert MRSA Assay (FDA approved for NASAL specimens only), is one component of a comprehensive MRSA colonization surveillance program. It is not intended to diagnose MRSA infection nor to guide or monitor treatment for MRSA infections.      Labs: Basic Metabolic Panel:  Recent Labs Lab 02/12/16 2041 02/12/16 2052 02/13/16 0455 02/14/16 0504 02/15/16 0805  NA 139 138 137 138 139  K 4.0 3.8 3.8 4.6 4.4  CL 103 101 106 106 109  CO2 25  --  24 23 21*  GLUCOSE 104* 96 91 97 93  BUN 18 19 11 9 10   CREATININE 1.18* 1.10* 1.04* 1.03* 0.85  CALCIUM 9.5  --  8.8* 8.3* 8.6*   Liver  Function Tests:  Recent Labs Lab 02/13/16 0455  AST 21  ALT 21  ALKPHOS 59  BILITOT 0.9  PROT 6.3*  ALBUMIN 2.9*   CBC:  Recent Labs Lab 02/12/16 2041 02/12/16 2052 02/14/16 0504 02/15/16 0805  WBC 7.4  --  6.6 6.4  NEUTROABS 4.3  --   --   --   HGB 11.7* 12.6 10.5* 10.3*  HCT 36.0 37.0 32.5* 31.9*  MCV 97.8  --  98.8 97.6  PLT 87*  --  96* 115*   Cardiac Enzymes:  Recent Labs Lab 02/13/16 0455 02/13/16 1517  TROPONINI 0.04*  0.04* 0.03   CBG:  Recent Labs Lab 02/13/16 0756 02/14/16 0808  GLUCAP 84 78     IMAGING STUDIES Ct Angio Chest Pe W/cm &/or Wo Cm  02/12/2016  CLINICAL DATA:  Shortness of breath, right leg swelling, and tachycardia for 2 days. EXAM: CT ANGIOGRAPHY CHEST WITH CONTRAST TECHNIQUE: Multidetector CT imaging of the chest was performed using the standard protocol during bolus administration of intravenous contrast. Multiplanar CT image reconstructions and MIPs were obtained to evaluate the vascular anatomy. CONTRAST:  137mL OMNIPAQUE IOHEXOL 350 MG/ML SOLN COMPARISON:  None. FINDINGS: Technically adequate study with good opacification of the central and segmental pulmonary arteries. Filling defects are demonstrated in the distal main pulmonary arteries bilaterally,  extending into multiple bilateral upper and lower lobe branches consistent with acute pulmonary embolus. RV to LV ratio is about 2, suggesting possible right heart strain. Normal heart size. Normal caliber thoracic aorta. No aortic dissection. Great vessel origins are patent. Scattered lymph nodes in the mediastinum are not pathologically enlarged are likely reactive. Small esophageal hiatal hernia. Esophagus is decompressed. Evaluation of lungs is limited due to respiratory motion artifact. Atelectasis is demonstrated in the lung bases. No focal airspace disease or consolidation. No pleural effusions. No pneumothorax. Included portions of the upper abdominal organs demonstrate a cyst in the  left kidney. Degenerative changes in the spine. No destructive bone lesions. Review of the MIP images confirms the above findings. IMPRESSION: Bilateral central and segmental pulmonary emboli with increased RV to LV ratio suggesting right heart strain. These results were called by telephone at the time of interpretation on 02/12/2016 at 11:35 pm to Dr. Tanna Furry , who verbally acknowledged these results. Electronically Signed   By: Lucienne Capers M.D.   On: 02/12/2016 23:36    DISCHARGE EXAMINATION: Filed Vitals:   02/16/16 0600 02/16/16 0617 02/16/16 0855 02/16/16 0856  BP: 107/41 110/50 121/55 121/55  Pulse: 99 79  75  Temp: 99.1 F (37.3 C)     TempSrc: Oral     Resp: 16     Height:      Weight:      SpO2: 93% 98%     General appearance: alert, cooperative, appears stated age, no distress and morbidly obese Resp: clear to auscultation bilaterally Cardio: regular rate and rhythm, S1, S2 normal, no murmur, click, rub or gallop GI: soft, non-tender; bowel sounds normal; no masses,  no organomegaly Extremities: extremities normal, atraumatic, no cyanosis or edema Neurologic: No focal deficits  DISPOSITION: Home with son  Discharge Instructions    Call MD for:  difficulty breathing, headache or visual disturbances    Complete by:  As directed      Call MD for:  extreme fatigue    Complete by:  As directed      Call MD for:  persistant dizziness or light-headedness    Complete by:  As directed      Call MD for:  persistant nausea and vomiting    Complete by:  As directed      Call MD for:  severe uncontrolled pain    Complete by:  As directed      Call MD for:  temperature >100.4    Complete by:  As directed      Diet - low sodium heart healthy    Complete by:  As directed      Discharge instructions    Complete by:  As directed   Please seek attention immediately if you note large amount of vaginal bleeding. Please seek attention if you develop dizziness/lightheadedness,  chest pains or shortness of breath or palpitations. Take your medications as prescribed.  You were cared for by a hospitalist during your hospital stay. If you have any questions about your discharge medications or the care you received while you were in the hospital after you are discharged, you can call the unit and asked to speak with the hospitalist on call if the hospitalist that took care of you is not available. Once you are discharged, your primary care physician will handle any further medical issues. Please note that NO REFILLS for any discharge medications will be authorized once you are discharged, as it is imperative that you return to  your primary care physician (or establish a relationship with a primary care physician if you do not have one) for your aftercare needs so that they can reassess your need for medications and monitor your lab values. If you do not have a primary care physician, you can call [phone] for a physician referral.     Increase activity slowly    Complete by:  As directed            ALLERGIES:  Allergies  Allergen Reactions  . Tylenol [Acetaminophen] Other (See Comments)    States "seems like eats my stomach"  . Sulfa Antibiotics Other (See Comments)    Unknown allergic reaction     Discharge Medication List as of 02/16/2016 11:57 AM    START taking these medications   Details  furosemide (LASIX) 20 MG tablet Take 1 tablet (20 mg total) by mouth daily., Starting 02/16/2016, Until Discontinued, Print    Rivaroxaban (XARELTO STARTER PACK) 15 & 20 MG TBPK Take as directed on package: Start with one 15mg  tablet by mouth twice a day with food. On Day 22, switch to one 20mg  tablet once a day with food., Print    rivaroxaban (XARELTO) 20 MG TABS tablet Take 1 tablet (20 mg total) by mouth daily with supper. START ONLY AFTER YOU HAVE FINISHED ALL THE TABLETS IN THE STARTER PACK, Starting 02/16/2016, Until Discontinued, Print      CONTINUE these medications  which have CHANGED   Details  lisinopril (PRINIVIL,ZESTRIL) 10 MG tablet Take 1 tablet (10 mg total) by mouth at bedtime. RESUME ONLY AFTER SEEN BY YOUR PCP AND IF BLOOD PRESSURE IS ELEVATED, Starting 02/16/2016, Until Discontinued, No Print      CONTINUE these medications which have NOT CHANGED   Details  atorvastatin (LIPITOR) 10 MG tablet TAKE ONE TABLET BY MOUTH ONCE DAILY IN THE EVENING, Normal    diphenhydrAMINE (BENADRYL) 25 MG tablet Take 25 mg by mouth every 6 (six) hours as needed for itching or allergies., Until Discontinued, Historical Med    gabapentin (NEURONTIN) 300 MG capsule Take 300 mg by mouth 3 (three) times daily., Until Discontinued, Historical Med    KLOR-CON M10 10 MEQ tablet TAKE ONE TABLET BY MOUTH ONCE DAILY, Normal    labetalol (NORMODYNE) 100 MG tablet Take 100 mg by mouth 2 (two) times daily., Until Discontinued, Historical Med    traMADol (ULTRAM) 50 MG tablet Take 50 mg by mouth 2 (two) times daily., Starting 08/06/2015, Until Discontinued, Historical Med      STOP taking these medications     torsemide (DEMADEX) 20 MG tablet        Follow-up Information    Follow up with Elyn Peers, MD. Schedule an appointment as soon as possible for a visit in 3 days.   Specialty:  Family Medicine   Why:  post hospitalization follow up   Contact information:   [address] [place] [address] [phone]       Follow up with [place] [facility].   Why:  PT and OT. Will call 1-2 days after discharge to set up first home health appointment.   Contact information:   [address] [place] [address] [phone]       TOTAL DISCHARGE TIME: 38 minutes  [facility] Pager [phone]  02/16/2016, 1:38 PM

## 2016-02-16 NOTE — Progress Notes (Signed)
Nsg Discharge Note  Admit Date:  02/12/2016 Discharge date: 02/16/2016   Lauren Santana [facility] to be D/C'd Home per MD order.  AVS completed.  Copy for chart, and copy for patient signed, and dated. Patient/caregiver able to verbalize understanding.  Discharge Medication:   Medication List    STOP taking these medications        torsemide 20 MG tablet  Commonly known as:  DEMADEX      TAKE these medications        atorvastatin 10 MG tablet  Commonly known as:  LIPITOR  TAKE ONE TABLET BY MOUTH ONCE DAILY IN THE EVENING     diphenhydrAMINE 25 MG tablet  Commonly known as:  BENADRYL  Take 25 mg by mouth every 6 (six) hours as needed for itching or allergies.     furosemide 20 MG tablet  Commonly known as:  LASIX  Take 1 tablet (20 mg total) by mouth daily.     gabapentin 300 MG capsule  Commonly known as:  NEURONTIN  Take 300 mg by mouth 3 (three) times daily.     KLOR-CON M10 10 MEQ tablet  Generic drug:  potassium chloride  TAKE ONE TABLET BY MOUTH ONCE DAILY     labetalol 100 MG tablet  Commonly known as:  NORMODYNE  Take 100 mg by mouth 2 (two) times daily.     lisinopril 10 MG tablet  Commonly known as:  PRINIVIL,ZESTRIL  Take 1 tablet (10 mg total) by mouth at bedtime. RESUME ONLY AFTER SEEN BY YOUR PCP AND IF BLOOD PRESSURE IS ELEVATED     Rivaroxaban 15 & 20 MG Tbpk  Commonly known as:  XARELTO STARTER PACK  Take as directed on package: Start with one 15mg  tablet by mouth twice a day with food. On Day 22, switch to one 20mg  tablet once a day with food.     rivaroxaban 20 MG Tabs tablet  Commonly known as:  XARELTO  Take 1 tablet (20 mg total) by mouth daily with supper. START ONLY AFTER YOU HAVE FINISHED ALL THE TABLETS IN THE STARTER PACK     traMADol 50 MG tablet  Commonly known as:  ULTRAM  Take 50 mg by mouth 2 (two) times daily.        Discharge Assessment: Filed Vitals:   02/16/16 0855 02/16/16 0856  BP: 121/55 121/55  Pulse:  75  Temp:     Resp:     Skin clean, dry and intact without evidence of skin break down, no evidence of skin tears noted. IV catheter discontinued intact. Site without signs and symptoms of complications - no redness or edema noted at insertion site, patient denies c/o pain - only slight tenderness at site.  Dressing with slight pressure applied.  D/c Instructions-Education: Discharge instructions given to patient/family with verbalized understanding. D/c education completed with patient/family including follow up instructions, medication list, d/c activities limitations if indicated, with other d/c instructions as indicated by MD - patient able to verbalize understanding, all questions fully answered. Patient instructed to return to ED, call 911, or call MD for any changes in condition.  Patient escorted via [place], and D/C home via private auto.  Dsean Vantol Margaretha Sheffield, RN 02/16/2016 12:28 PM

## 2016-02-18 DIAGNOSIS — R600 Localized edema: Secondary | ICD-10-CM | POA: Diagnosis not present

## 2016-02-18 DIAGNOSIS — N719 Inflammatory disease of uterus, unspecified: Secondary | ICD-10-CM | POA: Diagnosis not present

## 2016-02-18 DIAGNOSIS — I2699 Other pulmonary embolism without acute cor pulmonale: Secondary | ICD-10-CM | POA: Diagnosis not present

## 2016-02-18 DIAGNOSIS — E785 Hyperlipidemia, unspecified: Secondary | ICD-10-CM | POA: Diagnosis not present

## 2016-02-18 DIAGNOSIS — Z6841 Body Mass Index (BMI) 40.0 and over, adult: Secondary | ICD-10-CM | POA: Diagnosis not present

## 2016-02-18 DIAGNOSIS — I82401 Acute embolism and thrombosis of unspecified deep veins of right lower extremity: Secondary | ICD-10-CM | POA: Diagnosis not present

## 2016-02-18 DIAGNOSIS — I10 Essential (primary) hypertension: Secondary | ICD-10-CM | POA: Diagnosis not present

## 2016-02-18 DIAGNOSIS — Z7901 Long term (current) use of anticoagulants: Secondary | ICD-10-CM | POA: Diagnosis not present

## 2016-02-21 DIAGNOSIS — Z6841 Body Mass Index (BMI) 40.0 and over, adult: Secondary | ICD-10-CM | POA: Diagnosis not present

## 2016-02-21 DIAGNOSIS — N719 Inflammatory disease of uterus, unspecified: Secondary | ICD-10-CM | POA: Diagnosis not present

## 2016-02-21 DIAGNOSIS — I10 Essential (primary) hypertension: Secondary | ICD-10-CM | POA: Diagnosis not present

## 2016-02-21 DIAGNOSIS — E785 Hyperlipidemia, unspecified: Secondary | ICD-10-CM | POA: Diagnosis not present

## 2016-02-21 DIAGNOSIS — I2699 Other pulmonary embolism without acute cor pulmonale: Secondary | ICD-10-CM | POA: Diagnosis not present

## 2016-02-21 DIAGNOSIS — R600 Localized edema: Secondary | ICD-10-CM | POA: Diagnosis not present

## 2016-02-21 DIAGNOSIS — I82401 Acute embolism and thrombosis of unspecified deep veins of right lower extremity: Secondary | ICD-10-CM | POA: Diagnosis not present

## 2016-02-21 DIAGNOSIS — Z7901 Long term (current) use of anticoagulants: Secondary | ICD-10-CM | POA: Diagnosis not present

## 2016-02-25 DIAGNOSIS — E669 Obesity, unspecified: Secondary | ICD-10-CM | POA: Diagnosis not present

## 2016-02-25 DIAGNOSIS — I10 Essential (primary) hypertension: Secondary | ICD-10-CM | POA: Diagnosis not present

## 2016-02-25 DIAGNOSIS — G5600 Carpal tunnel syndrome, unspecified upper limb: Secondary | ICD-10-CM | POA: Diagnosis not present

## 2016-02-25 DIAGNOSIS — I2699 Other pulmonary embolism without acute cor pulmonale: Secondary | ICD-10-CM | POA: Diagnosis not present

## 2016-02-25 DIAGNOSIS — I82401 Acute embolism and thrombosis of unspecified deep veins of right lower extremity: Secondary | ICD-10-CM | POA: Diagnosis not present

## 2016-02-26 ENCOUNTER — Ambulatory Visit: Payer: PPO | Admitting: Adult Health

## 2016-02-26 DIAGNOSIS — Z0289 Encounter for other administrative examinations: Secondary | ICD-10-CM

## 2016-02-28 DIAGNOSIS — I2699 Other pulmonary embolism without acute cor pulmonale: Secondary | ICD-10-CM | POA: Diagnosis not present

## 2016-02-28 DIAGNOSIS — N719 Inflammatory disease of uterus, unspecified: Secondary | ICD-10-CM | POA: Diagnosis not present

## 2016-02-28 DIAGNOSIS — Z6841 Body Mass Index (BMI) 40.0 and over, adult: Secondary | ICD-10-CM | POA: Diagnosis not present

## 2016-02-28 DIAGNOSIS — I82401 Acute embolism and thrombosis of unspecified deep veins of right lower extremity: Secondary | ICD-10-CM | POA: Diagnosis not present

## 2016-02-28 DIAGNOSIS — Z7901 Long term (current) use of anticoagulants: Secondary | ICD-10-CM | POA: Diagnosis not present

## 2016-02-28 DIAGNOSIS — I10 Essential (primary) hypertension: Secondary | ICD-10-CM | POA: Diagnosis not present

## 2016-02-28 DIAGNOSIS — E785 Hyperlipidemia, unspecified: Secondary | ICD-10-CM | POA: Diagnosis not present

## 2016-02-28 DIAGNOSIS — R600 Localized edema: Secondary | ICD-10-CM | POA: Diagnosis not present

## 2016-03-04 DIAGNOSIS — I2699 Other pulmonary embolism without acute cor pulmonale: Secondary | ICD-10-CM | POA: Diagnosis not present

## 2016-03-04 DIAGNOSIS — N719 Inflammatory disease of uterus, unspecified: Secondary | ICD-10-CM | POA: Diagnosis not present

## 2016-03-04 DIAGNOSIS — Z96652 Presence of left artificial knee joint: Secondary | ICD-10-CM | POA: Diagnosis not present

## 2016-03-04 DIAGNOSIS — I10 Essential (primary) hypertension: Secondary | ICD-10-CM | POA: Diagnosis not present

## 2016-03-04 DIAGNOSIS — I82401 Acute embolism and thrombosis of unspecified deep veins of right lower extremity: Secondary | ICD-10-CM | POA: Diagnosis not present

## 2016-03-04 DIAGNOSIS — R600 Localized edema: Secondary | ICD-10-CM | POA: Diagnosis not present

## 2016-03-04 DIAGNOSIS — M1711 Unilateral primary osteoarthritis, right knee: Secondary | ICD-10-CM | POA: Diagnosis not present

## 2016-03-04 DIAGNOSIS — E785 Hyperlipidemia, unspecified: Secondary | ICD-10-CM | POA: Diagnosis not present

## 2016-03-04 DIAGNOSIS — Z6841 Body Mass Index (BMI) 40.0 and over, adult: Secondary | ICD-10-CM | POA: Diagnosis not present

## 2016-03-04 DIAGNOSIS — Z7901 Long term (current) use of anticoagulants: Secondary | ICD-10-CM | POA: Diagnosis not present

## 2016-03-05 DIAGNOSIS — I2699 Other pulmonary embolism without acute cor pulmonale: Secondary | ICD-10-CM | POA: Diagnosis not present

## 2016-03-05 DIAGNOSIS — R600 Localized edema: Secondary | ICD-10-CM | POA: Diagnosis not present

## 2016-03-05 DIAGNOSIS — I82401 Acute embolism and thrombosis of unspecified deep veins of right lower extremity: Secondary | ICD-10-CM | POA: Diagnosis not present

## 2016-03-05 DIAGNOSIS — N719 Inflammatory disease of uterus, unspecified: Secondary | ICD-10-CM | POA: Diagnosis not present

## 2016-03-05 DIAGNOSIS — Z7901 Long term (current) use of anticoagulants: Secondary | ICD-10-CM | POA: Diagnosis not present

## 2016-03-05 DIAGNOSIS — E785 Hyperlipidemia, unspecified: Secondary | ICD-10-CM | POA: Diagnosis not present

## 2016-03-05 DIAGNOSIS — I10 Essential (primary) hypertension: Secondary | ICD-10-CM | POA: Diagnosis not present

## 2016-03-05 DIAGNOSIS — Z6841 Body Mass Index (BMI) 40.0 and over, adult: Secondary | ICD-10-CM | POA: Diagnosis not present

## 2016-03-06 DIAGNOSIS — I2699 Other pulmonary embolism without acute cor pulmonale: Secondary | ICD-10-CM | POA: Diagnosis not present

## 2016-03-06 DIAGNOSIS — N719 Inflammatory disease of uterus, unspecified: Secondary | ICD-10-CM | POA: Diagnosis not present

## 2016-03-06 DIAGNOSIS — R600 Localized edema: Secondary | ICD-10-CM | POA: Diagnosis not present

## 2016-03-06 DIAGNOSIS — Z7901 Long term (current) use of anticoagulants: Secondary | ICD-10-CM | POA: Diagnosis not present

## 2016-03-06 DIAGNOSIS — E785 Hyperlipidemia, unspecified: Secondary | ICD-10-CM | POA: Diagnosis not present

## 2016-03-06 DIAGNOSIS — I82401 Acute embolism and thrombosis of unspecified deep veins of right lower extremity: Secondary | ICD-10-CM | POA: Diagnosis not present

## 2016-03-06 DIAGNOSIS — I10 Essential (primary) hypertension: Secondary | ICD-10-CM | POA: Diagnosis not present

## 2016-03-06 DIAGNOSIS — Z6841 Body Mass Index (BMI) 40.0 and over, adult: Secondary | ICD-10-CM | POA: Diagnosis not present

## 2016-03-13 DIAGNOSIS — I10 Essential (primary) hypertension: Secondary | ICD-10-CM | POA: Diagnosis not present

## 2016-03-13 DIAGNOSIS — I82401 Acute embolism and thrombosis of unspecified deep veins of right lower extremity: Secondary | ICD-10-CM | POA: Diagnosis not present

## 2016-03-13 DIAGNOSIS — E785 Hyperlipidemia, unspecified: Secondary | ICD-10-CM | POA: Diagnosis not present

## 2016-03-13 DIAGNOSIS — R600 Localized edema: Secondary | ICD-10-CM | POA: Diagnosis not present

## 2016-03-13 DIAGNOSIS — E069 Thyroiditis, unspecified: Secondary | ICD-10-CM | POA: Diagnosis not present

## 2016-03-13 DIAGNOSIS — I82601 Acute embolism and thrombosis of unspecified veins of right upper extremity: Secondary | ICD-10-CM | POA: Diagnosis not present

## 2016-03-13 DIAGNOSIS — N719 Inflammatory disease of uterus, unspecified: Secondary | ICD-10-CM | POA: Diagnosis not present

## 2016-03-13 DIAGNOSIS — L309 Dermatitis, unspecified: Secondary | ICD-10-CM | POA: Diagnosis not present

## 2016-03-13 DIAGNOSIS — Z6841 Body Mass Index (BMI) 40.0 and over, adult: Secondary | ICD-10-CM | POA: Diagnosis not present

## 2016-03-13 DIAGNOSIS — I2699 Other pulmonary embolism without acute cor pulmonale: Secondary | ICD-10-CM | POA: Diagnosis not present

## 2016-03-13 DIAGNOSIS — Z7901 Long term (current) use of anticoagulants: Secondary | ICD-10-CM | POA: Diagnosis not present

## 2016-03-17 ENCOUNTER — Other Ambulatory Visit: Payer: Self-pay | Admitting: Internal Medicine

## 2016-03-20 DIAGNOSIS — R600 Localized edema: Secondary | ICD-10-CM | POA: Diagnosis not present

## 2016-03-20 DIAGNOSIS — I2699 Other pulmonary embolism without acute cor pulmonale: Secondary | ICD-10-CM | POA: Diagnosis not present

## 2016-03-20 DIAGNOSIS — Z6841 Body Mass Index (BMI) 40.0 and over, adult: Secondary | ICD-10-CM | POA: Diagnosis not present

## 2016-03-20 DIAGNOSIS — I82401 Acute embolism and thrombosis of unspecified deep veins of right lower extremity: Secondary | ICD-10-CM | POA: Diagnosis not present

## 2016-03-20 DIAGNOSIS — Z7901 Long term (current) use of anticoagulants: Secondary | ICD-10-CM | POA: Diagnosis not present

## 2016-03-20 DIAGNOSIS — E785 Hyperlipidemia, unspecified: Secondary | ICD-10-CM | POA: Diagnosis not present

## 2016-03-20 DIAGNOSIS — I10 Essential (primary) hypertension: Secondary | ICD-10-CM | POA: Diagnosis not present

## 2016-03-20 DIAGNOSIS — N719 Inflammatory disease of uterus, unspecified: Secondary | ICD-10-CM | POA: Diagnosis not present

## 2016-03-27 DIAGNOSIS — L309 Dermatitis, unspecified: Secondary | ICD-10-CM | POA: Diagnosis not present

## 2016-03-27 DIAGNOSIS — I2699 Other pulmonary embolism without acute cor pulmonale: Secondary | ICD-10-CM | POA: Diagnosis not present

## 2016-03-27 DIAGNOSIS — I10 Essential (primary) hypertension: Secondary | ICD-10-CM | POA: Diagnosis not present

## 2016-03-27 DIAGNOSIS — L308 Other specified dermatitis: Secondary | ICD-10-CM | POA: Diagnosis not present

## 2016-04-17 ENCOUNTER — Other Ambulatory Visit: Payer: Self-pay | Admitting: Internal Medicine

## 2016-04-17 DIAGNOSIS — L309 Dermatitis, unspecified: Secondary | ICD-10-CM | POA: Diagnosis not present

## 2016-04-17 DIAGNOSIS — I2699 Other pulmonary embolism without acute cor pulmonale: Secondary | ICD-10-CM | POA: Diagnosis not present

## 2016-04-17 DIAGNOSIS — I82401 Acute embolism and thrombosis of unspecified deep veins of right lower extremity: Secondary | ICD-10-CM | POA: Diagnosis not present

## 2016-04-17 DIAGNOSIS — I10 Essential (primary) hypertension: Secondary | ICD-10-CM | POA: Diagnosis not present

## 2016-04-18 ENCOUNTER — Other Ambulatory Visit: Payer: Self-pay | Admitting: Internal Medicine

## 2016-04-21 ENCOUNTER — Other Ambulatory Visit: Payer: Self-pay | Admitting: Internal Medicine

## 2016-04-22 ENCOUNTER — Other Ambulatory Visit: Payer: Self-pay | Admitting: Internal Medicine

## 2016-05-12 ENCOUNTER — Ambulatory Visit: Payer: PPO | Admitting: Internal Medicine

## 2016-05-15 ENCOUNTER — Ambulatory Visit: Payer: PPO | Admitting: Adult Health

## 2016-05-15 DIAGNOSIS — I82401 Acute embolism and thrombosis of unspecified deep veins of right lower extremity: Secondary | ICD-10-CM | POA: Diagnosis not present

## 2016-06-16 DIAGNOSIS — L02231 Carbuncle of abdominal wall: Secondary | ICD-10-CM | POA: Diagnosis not present

## 2016-06-16 DIAGNOSIS — L03314 Cellulitis of groin: Secondary | ICD-10-CM | POA: Diagnosis not present

## 2016-06-23 DIAGNOSIS — I82601 Acute embolism and thrombosis of unspecified veins of right upper extremity: Secondary | ICD-10-CM | POA: Diagnosis not present

## 2016-06-23 DIAGNOSIS — L03314 Cellulitis of groin: Secondary | ICD-10-CM | POA: Diagnosis not present

## 2016-07-03 DIAGNOSIS — M1712 Unilateral primary osteoarthritis, left knee: Secondary | ICD-10-CM | POA: Diagnosis not present

## 2016-07-03 DIAGNOSIS — M1711 Unilateral primary osteoarthritis, right knee: Secondary | ICD-10-CM | POA: Diagnosis not present

## 2016-07-07 DIAGNOSIS — I10 Essential (primary) hypertension: Secondary | ICD-10-CM | POA: Diagnosis not present

## 2016-07-07 DIAGNOSIS — L309 Dermatitis, unspecified: Secondary | ICD-10-CM | POA: Diagnosis not present

## 2016-07-07 DIAGNOSIS — I82401 Acute embolism and thrombosis of unspecified deep veins of right lower extremity: Secondary | ICD-10-CM | POA: Diagnosis not present

## 2016-08-08 LAB — GLUCOSE, POCT (MANUAL RESULT ENTRY): POC Glucose: 83 mg/dl (ref 70–99)

## 2016-09-08 DIAGNOSIS — I10 Essential (primary) hypertension: Secondary | ICD-10-CM | POA: Diagnosis not present

## 2016-09-08 DIAGNOSIS — E669 Obesity, unspecified: Secondary | ICD-10-CM | POA: Diagnosis not present

## 2016-09-08 DIAGNOSIS — F064 Anxiety disorder due to known physiological condition: Secondary | ICD-10-CM | POA: Diagnosis not present

## 2016-09-08 DIAGNOSIS — I2699 Other pulmonary embolism without acute cor pulmonale: Secondary | ICD-10-CM | POA: Diagnosis not present

## 2016-09-08 DIAGNOSIS — M159 Polyosteoarthritis, unspecified: Secondary | ICD-10-CM | POA: Diagnosis not present

## 2016-09-10 ENCOUNTER — Ambulatory Visit: Payer: PPO | Admitting: Internal Medicine

## 2016-09-16 DIAGNOSIS — M1711 Unilateral primary osteoarthritis, right knee: Secondary | ICD-10-CM | POA: Diagnosis not present

## 2016-09-16 DIAGNOSIS — M25561 Pain in right knee: Secondary | ICD-10-CM | POA: Diagnosis not present

## 2016-09-16 DIAGNOSIS — Z96652 Presence of left artificial knee joint: Secondary | ICD-10-CM | POA: Diagnosis not present

## 2016-09-24 DIAGNOSIS — M79605 Pain in left leg: Secondary | ICD-10-CM | POA: Diagnosis not present

## 2016-09-24 DIAGNOSIS — M79604 Pain in right leg: Secondary | ICD-10-CM | POA: Diagnosis not present

## 2016-09-25 DIAGNOSIS — N939 Abnormal uterine and vaginal bleeding, unspecified: Secondary | ICD-10-CM | POA: Diagnosis not present

## 2016-09-25 DIAGNOSIS — N804 Endometriosis of rectovaginal septum and vagina: Secondary | ICD-10-CM | POA: Diagnosis not present

## 2016-10-02 ENCOUNTER — Other Ambulatory Visit: Payer: Self-pay | Admitting: Obstetrics & Gynecology

## 2016-10-02 ENCOUNTER — Other Ambulatory Visit: Payer: Self-pay | Admitting: Family Medicine

## 2016-10-02 DIAGNOSIS — N939 Abnormal uterine and vaginal bleeding, unspecified: Secondary | ICD-10-CM

## 2016-10-02 DIAGNOSIS — M79605 Pain in left leg: Secondary | ICD-10-CM

## 2016-10-02 DIAGNOSIS — M79604 Pain in right leg: Secondary | ICD-10-CM

## 2016-10-03 ENCOUNTER — Ambulatory Visit
Admission: RE | Admit: 2016-10-03 | Discharge: 2016-10-03 | Disposition: A | Discharge status: Home / Self Care | Payer: PPO | Source: Ambulatory Visit | Attending: Family Medicine | Admitting: Family Medicine

## 2016-10-03 DIAGNOSIS — I82431 Acute embolism and thrombosis of right popliteal vein: Secondary | ICD-10-CM | POA: Diagnosis not present

## 2016-10-03 DIAGNOSIS — M79604 Pain in right leg: Secondary | ICD-10-CM

## 2016-10-03 DIAGNOSIS — M79605 Pain in left leg: Secondary | ICD-10-CM

## 2016-10-13 DIAGNOSIS — Z1231 Encounter for screening mammogram for malignant neoplasm of breast: Secondary | ICD-10-CM | POA: Diagnosis not present

## 2016-10-23 ENCOUNTER — Ambulatory Visit
Admission: RE | Admit: 2016-10-23 | Discharge: 2016-10-23 | Disposition: A | Discharge status: Home / Self Care | Payer: PPO | Source: Ambulatory Visit | Attending: Obstetrics & Gynecology | Admitting: Obstetrics & Gynecology

## 2016-10-23 DIAGNOSIS — N950 Postmenopausal bleeding: Secondary | ICD-10-CM | POA: Diagnosis not present

## 2016-10-23 DIAGNOSIS — N939 Abnormal uterine and vaginal bleeding, unspecified: Secondary | ICD-10-CM

## 2016-10-23 MED ORDER — GADOBENATE DIMEGLUMINE 529 MG/ML IV SOLN
20.0000 mL | Freq: Once | INTRAVENOUS | Status: AC | PRN
  Administered 2016-10-23: 20 mL via INTRAVENOUS

## 2016-10-24 ENCOUNTER — Other Ambulatory Visit: Payer: PPO

## 2016-10-29 DIAGNOSIS — N804 Endometriosis of rectovaginal septum and vagina: Secondary | ICD-10-CM | POA: Diagnosis not present

## 2016-10-29 DIAGNOSIS — N939 Abnormal uterine and vaginal bleeding, unspecified: Secondary | ICD-10-CM | POA: Diagnosis not present

## 2016-11-10 DIAGNOSIS — I10 Essential (primary) hypertension: Secondary | ICD-10-CM | POA: Diagnosis not present

## 2016-11-10 DIAGNOSIS — I2699 Other pulmonary embolism without acute cor pulmonale: Secondary | ICD-10-CM | POA: Diagnosis not present

## 2016-11-10 DIAGNOSIS — E669 Obesity, unspecified: Secondary | ICD-10-CM | POA: Diagnosis not present

## 2016-11-10 DIAGNOSIS — F064 Anxiety disorder due to known physiological condition: Secondary | ICD-10-CM | POA: Diagnosis not present

## 2016-12-09 DIAGNOSIS — N952 Postmenopausal atrophic vaginitis: Secondary | ICD-10-CM | POA: Diagnosis not present

## 2016-12-09 DIAGNOSIS — Z886 Allergy status to analgesic agent status: Secondary | ICD-10-CM | POA: Diagnosis not present

## 2016-12-09 DIAGNOSIS — Z882 Allergy status to sulfonamides status: Secondary | ICD-10-CM | POA: Diagnosis not present

## 2016-12-09 DIAGNOSIS — I10 Essential (primary) hypertension: Secondary | ICD-10-CM | POA: Diagnosis not present

## 2016-12-09 DIAGNOSIS — Z86718 Personal history of other venous thrombosis and embolism: Secondary | ICD-10-CM | POA: Diagnosis not present

## 2016-12-09 DIAGNOSIS — Z7901 Long term (current) use of anticoagulants: Secondary | ICD-10-CM | POA: Diagnosis not present

## 2016-12-09 DIAGNOSIS — N939 Abnormal uterine and vaginal bleeding, unspecified: Secondary | ICD-10-CM | POA: Diagnosis not present

## 2016-12-09 DIAGNOSIS — Z90710 Acquired absence of both cervix and uterus: Secondary | ICD-10-CM | POA: Diagnosis not present

## 2016-12-09 DIAGNOSIS — Z8742 Personal history of other diseases of the female genital tract: Secondary | ICD-10-CM | POA: Diagnosis not present

## 2016-12-09 DIAGNOSIS — R1900 Intra-abdominal and pelvic swelling, mass and lump, unspecified site: Secondary | ICD-10-CM | POA: Diagnosis not present

## 2016-12-09 DIAGNOSIS — Z79899 Other long term (current) drug therapy: Secondary | ICD-10-CM | POA: Diagnosis not present

## 2016-12-22 DIAGNOSIS — N809 Endometriosis, unspecified: Secondary | ICD-10-CM | POA: Diagnosis not present

## 2016-12-22 DIAGNOSIS — R1900 Intra-abdominal and pelvic swelling, mass and lump, unspecified site: Secondary | ICD-10-CM | POA: Diagnosis not present

## 2016-12-22 DIAGNOSIS — Z90710 Acquired absence of both cervix and uterus: Secondary | ICD-10-CM | POA: Diagnosis not present

## 2016-12-22 DIAGNOSIS — Z90721 Acquired absence of ovaries, unilateral: Secondary | ICD-10-CM | POA: Diagnosis not present

## 2016-12-26 ENCOUNTER — Encounter (HOSPITAL_COMMUNITY): Payer: Self-pay | Admitting: Family Medicine

## 2016-12-26 ENCOUNTER — Ambulatory Visit (HOSPITAL_COMMUNITY)
Admission: EM | Admit: 2016-12-26 | Discharge: 2016-12-26 | Disposition: A | Discharge status: Home / Self Care | Payer: PPO | Attending: Family Medicine | Admitting: Family Medicine

## 2016-12-26 ENCOUNTER — Ambulatory Visit (INDEPENDENT_AMBULATORY_CARE_PROVIDER_SITE_OTHER): Payer: PPO

## 2016-12-26 DIAGNOSIS — J069 Acute upper respiratory infection, unspecified: Secondary | ICD-10-CM

## 2016-12-26 DIAGNOSIS — R0989 Other specified symptoms and signs involving the circulatory and respiratory systems: Secondary | ICD-10-CM | POA: Diagnosis not present

## 2016-12-26 LAB — POCT URINALYSIS DIP (DEVICE)
Bilirubin Urine: NEGATIVE
Glucose, UA: NEGATIVE mg/dL
Ketones, ur: NEGATIVE mg/dL
Leukocytes, UA: NEGATIVE
Nitrite: NEGATIVE
Protein, ur: 30 mg/dL — AB
Specific Gravity, Urine: 1.015 (ref 1.005–1.030)
Urobilinogen, UA: 0.2 mg/dL (ref 0.0–1.0)
pH: 7 (ref 5.0–8.0)

## 2016-12-26 NOTE — ED Triage Notes (Signed)
Pt here for congestion in her chest, weakness and urinary frequency and incontinence.

## 2016-12-26 NOTE — ED Provider Notes (Signed)
[place]    CSN: [id number] Arrival date & time: 12/26/16  1551     History   Chief Complaint Chief Complaint  Patient presents with  . Cough  . Weakness  . Urinary Frequency    HPI Lauren Santana is a 66 y.o. female.   The history is provided by the patient and a relative.  Cough  Cough characteristics:  Dry and non-productive Severity:  Mild Onset quality:  Gradual Duration:  1 day Progression:  Unchanged Chronicity:  New Smoker: no   Context: upper respiratory infection   Relieved by:  Nothing Worsened by:  Nothing Ineffective treatments:  None tried Associated symptoms: no chills, no fever and no sore throat   Weakness   Urinary Frequency     Past Medical History:  Diagnosis Date  . Arthritis   . Edema    B/LLE  . High cholesterol   . Hypertension   . Numbness    B/LLE  . Wears glasses     Patient Active Problem List   Diagnosis Date Noted  . Tachycardia   . Acute deep vein thrombosis (DVT) of right lower extremity ([place]) 02/14/2016  . Pulmonary emboli ([place]) 02/13/2016  . PE (pulmonary embolism) 02/13/2016  . AKI (acute kidney injury) ([place]) 02/13/2016  . Pulmonary embolism with acute cor pulmonale ([place])   . Pelvic mass in female   . Morbid obesity with BMI of 40.0-44.9, adult ([place]) 08/20/2015  . 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 Essential hypertension, benign 07/23/2014  . Edema 07/23/2014  . Dyslipidemia 07/23/2014  . Lower extremity numbness 07/23/2014  . 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 Postmenopausal bleeding 03/10/2012  . Endometriosis of pelvis 01/22/2007    Past Surgical History:  Procedure Laterality Date  . ABDOMINAL HYSTERECTOMY     and BSO  . [place]  . COLONOSCOPY    . HERNIA REPAIR  2001   incisional  . MULTIPLE TOOTH EXTRACTIONS  2011  . TOTAL KNEE ARTHROPLASTY Left 07/17/2014   Procedure: TOTAL KNEE ARTHROPLASTY;  Surgeon: Kerin Salen, MD;  Location: [facility];   Service: Orthopedics;  Laterality: Left;    OB History    Gravida Para Term Preterm AB Living   4 3 3   1 2    SAB TAB Ectopic Multiple Live Births   1               Home Medications    Prior to Admission medications   Medication Sig Start Date End Date Taking? Authorizing Provider  atorvastatin (LIPITOR) 10 MG tablet TAKE ONE TABLET BY MOUTH  ONCE DAILY IN THE EVENING 03/18/16   Gildardo Cranker, DO  diphenhydrAMINE (BENADRYL) 25 MG tablet Take 25 mg by mouth every 6 (six) hours as needed for itching or allergies.    Historical Provider, MD  furosemide (LASIX) 20 MG tablet Take 1 tablet (20 mg total) by mouth daily. 02/16/16   Bonnielee Haff, MD  gabapentin (NEURONTIN) 300 MG capsule Take 300 mg by mouth 3 (three) times daily.    Historical Provider, MD  KLOR-CON M10 10 MEQ tablet TAKE ONE TABLET BY MOUTH ONCE DAILY Patient taking differently: TAKE 10 MEQ BY MOUTH ONCE DAILY 08/24/14   Lauree Chandler, NP  labetalol (NORMODYNE) 100 MG tablet Take 100 mg by mouth 2 (two) times daily.    Historical Provider, MD  lisinopril (PRINIVIL,ZESTRIL) 10 MG tablet Take 1 tablet (10 mg total) by mouth at bedtime. RESUME  ONLY AFTER SEEN BY YOUR PCP AND IF BLOOD PRESSURE IS ELEVATED 02/16/16   Bonnielee Haff, MD  Rivaroxaban (XARELTO STARTER PACK) 15 & 20 MG TBPK Take as directed on package: Start with one 15mg  tablet by mouth twice a day with food. On Day 22, switch to one 20mg  tablet once a day with food. 02/16/16   Bonnielee Haff, MD  rivaroxaban (XARELTO) 20 MG TABS tablet Take 1 tablet (20 mg total) by mouth daily with supper. START ONLY AFTER YOU HAVE FINISHED ALL THE TABLETS IN THE STARTER PACK 02/16/16   Bonnielee Haff, MD  traMADol (ULTRAM) 50 MG tablet Take 50 mg by mouth 2 (two) times daily. 08/06/15   Historical Provider, MD    Family History Family History  Problem Relation Age of Onset  . Colon cancer Sister   . Cancer Sister     unsure of type of cancer    Social History Social History    Substance Use Topics  . Smoking status: Never Smoker  . Smokeless tobacco: Never Used  . Alcohol use No     Allergies   Tylenol [acetaminophen] and Sulfa antibiotics   Review of Systems Review of Systems  Constitutional: Positive for fatigue. Negative for activity change, appetite change, chills and fever.  HENT: Negative.  Negative for sore throat.   Respiratory: Positive for cough.   Cardiovascular: Negative.   Gastrointestinal: Negative.   Genitourinary: Positive for difficulty urinating and frequency.  Neurological: Positive for weakness.  All other systems reviewed and are negative.    Physical Exam Triage Vital Signs ED Triage Vitals  Enc Vitals Group     BP      Pulse      Resp      Temp      Temp src      SpO2      Weight      Height      Head Circumference      Peak Flow      Pain Score      Pain Loc      Pain Edu?      Excl. in [place]?    No data found.   Updated Vital Signs There were no vitals taken for this visit.  Visual Acuity Right Eye Distance:   Left Eye Distance:   Bilateral Distance:    Right Eye Near:   Left Eye Near:    Bilateral Near:     Physical Exam  Constitutional: She is oriented to person, place, and time. She appears well-developed and well-nourished.  Neck: Normal range of motion. Neck supple.  Cardiovascular: Normal rate and normal heart sounds.   Pulmonary/Chest: Effort normal and breath sounds normal.  Musculoskeletal: She exhibits no edema.  Lymphadenopathy:    She has no cervical adenopathy.  Neurological: She is alert and oriented to person, place, and time.  Skin: Skin is warm and dry.  Nursing note and vitals reviewed.    UC Treatments / Results  Labs (all labs ordered are listed, but only abnormal results are displayed) Labs Reviewed  POCT URINALYSIS DIP (DEVICE) - Abnormal; Notable for the following:       Result Value   Hgb urine dipstick TRACE (*)    Protein, ur 30 (*)    All other components  within normal limits    EKG  EKG Interpretation None       Radiology Dg Chest 2 View  Result Date: 12/26/2016 CLINICAL DATA:  Chest congestion for 8  days.  No fever. EXAM: CHEST  2 VIEW COMPARISON:  07/19/2014 FINDINGS: There is bilateral interstitial prominence. There is no focal consolidation. There is no pleural effusion or pneumothorax. There is stable cardiomegaly. The osseous structures are unremarkable. IMPRESSION: No active cardiopulmonary disease. Electronically Signed   By: Kathreen Devoid   On: 12/26/2016 17:30   X-rays reviewed and report per radiologist.  Procedures Procedures (including critical care time)  Medications Ordered in UC Medications - No data to display   Initial Impression / Assessment and Plan / UC Course  I have reviewed the triage vital signs and the nursing notes.  Pertinent labs & imaging results that were available during my care of the patient were reviewed by me and considered in my medical decision making (see chart for details).  Clinical Course as of Dec 27 1803  Fri Dec 26, 2016  1757 DG Chest 2 View [JK]  1758 DG Chest 2 View [JK]  1758 DG Chest 2 View [JK]  1758 DG Chest 2 View [JK]  1759 DG Chest 2 View [JK]  1759 DG Chest 2 View [JK]    Clinical Course User Index [JK] Billy Fischer, MD      Final Clinical Impressions(s) / UC Diagnoses   Final diagnoses:  Upper respiratory tract infection, unspecified type    New Prescriptions New Prescriptions   No medications on file     Billy Fischer, MD 12/26/16 1805

## 2016-12-26 NOTE — Discharge Instructions (Signed)
Drink plenty of fluids, use humidifier for cough, see your doctor or urologist about bladder issues

## 2016-12-31 DIAGNOSIS — I10 Essential (primary) hypertension: Secondary | ICD-10-CM | POA: Diagnosis not present

## 2016-12-31 DIAGNOSIS — N809 Endometriosis, unspecified: Secondary | ICD-10-CM | POA: Diagnosis not present

## 2016-12-31 DIAGNOSIS — N800 Endometriosis of uterus: Secondary | ICD-10-CM | POA: Diagnosis not present

## 2016-12-31 DIAGNOSIS — Z90710 Acquired absence of both cervix and uterus: Secondary | ICD-10-CM | POA: Diagnosis not present

## 2016-12-31 DIAGNOSIS — Z90722 Acquired absence of ovaries, bilateral: Secondary | ICD-10-CM | POA: Diagnosis not present

## 2016-12-31 DIAGNOSIS — N950 Postmenopausal bleeding: Secondary | ICD-10-CM | POA: Diagnosis not present

## 2016-12-31 DIAGNOSIS — Z86718 Personal history of other venous thrombosis and embolism: Secondary | ICD-10-CM | POA: Diagnosis not present

## 2016-12-31 DIAGNOSIS — Z7901 Long term (current) use of anticoagulants: Secondary | ICD-10-CM | POA: Diagnosis not present

## 2017-01-08 DIAGNOSIS — N809 Endometriosis, unspecified: Secondary | ICD-10-CM | POA: Insufficient documentation

## 2017-01-08 DIAGNOSIS — N80129 Deep endometriosis of ovary, unspecified ovary: Secondary | ICD-10-CM | POA: Insufficient documentation

## 2017-01-08 DIAGNOSIS — Z90722 Acquired absence of ovaries, bilateral: Secondary | ICD-10-CM | POA: Insufficient documentation

## 2017-01-08 DIAGNOSIS — Z90710 Acquired absence of both cervix and uterus: Secondary | ICD-10-CM | POA: Insufficient documentation

## 2017-01-12 DIAGNOSIS — E669 Obesity, unspecified: Secondary | ICD-10-CM | POA: Diagnosis not present

## 2017-01-12 DIAGNOSIS — M159 Polyosteoarthritis, unspecified: Secondary | ICD-10-CM | POA: Diagnosis not present

## 2017-02-17 DIAGNOSIS — I82601 Acute embolism and thrombosis of unspecified veins of right upper extremity: Secondary | ICD-10-CM | POA: Diagnosis not present

## 2017-02-17 DIAGNOSIS — N939 Abnormal uterine and vaginal bleeding, unspecified: Secondary | ICD-10-CM | POA: Diagnosis not present

## 2017-02-17 DIAGNOSIS — F064 Anxiety disorder due to known physiological condition: Secondary | ICD-10-CM | POA: Diagnosis not present

## 2017-02-17 DIAGNOSIS — M159 Polyosteoarthritis, unspecified: Secondary | ICD-10-CM | POA: Diagnosis not present

## 2017-03-16 DIAGNOSIS — F064 Anxiety disorder due to known physiological condition: Secondary | ICD-10-CM | POA: Diagnosis not present

## 2017-03-16 DIAGNOSIS — E669 Obesity, unspecified: Secondary | ICD-10-CM | POA: Diagnosis not present

## 2017-03-16 DIAGNOSIS — F6089 Other specific personality disorders: Secondary | ICD-10-CM | POA: Diagnosis not present

## 2017-03-16 DIAGNOSIS — I10 Essential (primary) hypertension: Secondary | ICD-10-CM | POA: Diagnosis not present

## 2017-03-16 DIAGNOSIS — M79609 Pain in unspecified limb: Secondary | ICD-10-CM | POA: Diagnosis not present

## 2017-04-08 DIAGNOSIS — H04121 Dry eye syndrome of right lacrimal gland: Secondary | ICD-10-CM | POA: Diagnosis not present

## 2017-04-15 DIAGNOSIS — Z96652 Presence of left artificial knee joint: Secondary | ICD-10-CM | POA: Diagnosis not present

## 2017-04-15 DIAGNOSIS — M1711 Unilateral primary osteoarthritis, right knee: Secondary | ICD-10-CM | POA: Diagnosis not present

## 2017-04-23 DIAGNOSIS — I82601 Acute embolism and thrombosis of unspecified veins of right upper extremity: Secondary | ICD-10-CM | POA: Diagnosis not present

## 2017-04-23 DIAGNOSIS — R531 Weakness: Secondary | ICD-10-CM | POA: Diagnosis not present

## 2017-04-23 DIAGNOSIS — M159 Polyosteoarthritis, unspecified: Secondary | ICD-10-CM | POA: Diagnosis not present

## 2017-04-27 DIAGNOSIS — E669 Obesity, unspecified: Secondary | ICD-10-CM | POA: Diagnosis not present

## 2017-04-27 DIAGNOSIS — N804 Endometriosis of rectovaginal septum and vagina: Secondary | ICD-10-CM | POA: Diagnosis not present

## 2017-04-27 DIAGNOSIS — F064 Anxiety disorder due to known physiological condition: Secondary | ICD-10-CM | POA: Diagnosis not present

## 2017-04-27 DIAGNOSIS — I10 Essential (primary) hypertension: Secondary | ICD-10-CM | POA: Diagnosis not present

## 2017-04-30 DIAGNOSIS — R261 Paralytic gait: Secondary | ICD-10-CM | POA: Diagnosis not present

## 2017-04-30 DIAGNOSIS — M159 Polyosteoarthritis, unspecified: Secondary | ICD-10-CM | POA: Diagnosis not present

## 2017-04-30 DIAGNOSIS — I10 Essential (primary) hypertension: Secondary | ICD-10-CM | POA: Diagnosis not present

## 2017-04-30 DIAGNOSIS — Z86718 Personal history of other venous thrombosis and embolism: Secondary | ICD-10-CM | POA: Diagnosis not present

## 2017-04-30 DIAGNOSIS — F419 Anxiety disorder, unspecified: Secondary | ICD-10-CM | POA: Diagnosis not present

## 2017-05-06 DIAGNOSIS — H524 Presbyopia: Secondary | ICD-10-CM | POA: Diagnosis not present

## 2017-05-06 DIAGNOSIS — H2513 Age-related nuclear cataract, bilateral: Secondary | ICD-10-CM | POA: Diagnosis not present

## 2017-05-06 DIAGNOSIS — H04123 Dry eye syndrome of bilateral lacrimal glands: Secondary | ICD-10-CM | POA: Diagnosis not present

## 2017-05-06 DIAGNOSIS — H25043 Posterior subcapsular polar age-related cataract, bilateral: Secondary | ICD-10-CM | POA: Diagnosis not present

## 2017-05-07 DIAGNOSIS — Z86718 Personal history of other venous thrombosis and embolism: Secondary | ICD-10-CM | POA: Diagnosis not present

## 2017-05-07 DIAGNOSIS — I10 Essential (primary) hypertension: Secondary | ICD-10-CM | POA: Diagnosis not present

## 2017-05-07 DIAGNOSIS — R261 Paralytic gait: Secondary | ICD-10-CM | POA: Diagnosis not present

## 2017-05-07 DIAGNOSIS — M159 Polyosteoarthritis, unspecified: Secondary | ICD-10-CM | POA: Diagnosis not present

## 2017-05-07 DIAGNOSIS — F419 Anxiety disorder, unspecified: Secondary | ICD-10-CM | POA: Diagnosis not present

## 2017-05-26 DIAGNOSIS — Z86718 Personal history of other venous thrombosis and embolism: Secondary | ICD-10-CM | POA: Diagnosis not present

## 2017-05-26 DIAGNOSIS — I10 Essential (primary) hypertension: Secondary | ICD-10-CM | POA: Diagnosis not present

## 2017-05-26 DIAGNOSIS — F419 Anxiety disorder, unspecified: Secondary | ICD-10-CM | POA: Diagnosis not present

## 2017-05-26 DIAGNOSIS — M159 Polyosteoarthritis, unspecified: Secondary | ICD-10-CM | POA: Diagnosis not present

## 2017-05-26 DIAGNOSIS — R261 Paralytic gait: Secondary | ICD-10-CM | POA: Diagnosis not present

## 2017-05-29 ENCOUNTER — Ambulatory Visit
Admission: RE | Admit: 2017-05-29 | Discharge: 2017-05-29 | Disposition: A | Discharge status: Home / Self Care | Payer: PPO | Source: Ambulatory Visit | Attending: Family Medicine | Admitting: Family Medicine

## 2017-05-29 ENCOUNTER — Other Ambulatory Visit: Payer: Self-pay | Admitting: Family Medicine

## 2017-05-29 DIAGNOSIS — S99921A Unspecified injury of right foot, initial encounter: Secondary | ICD-10-CM | POA: Diagnosis not present

## 2017-05-29 DIAGNOSIS — M79674 Pain in right toe(s): Secondary | ICD-10-CM

## 2017-05-29 DIAGNOSIS — M7989 Other specified soft tissue disorders: Secondary | ICD-10-CM | POA: Diagnosis not present

## 2017-05-29 DIAGNOSIS — M79671 Pain in right foot: Secondary | ICD-10-CM | POA: Diagnosis not present

## 2017-06-25 DIAGNOSIS — M6281 Muscle weakness (generalized): Secondary | ICD-10-CM | POA: Diagnosis not present

## 2017-06-25 DIAGNOSIS — M159 Polyosteoarthritis, unspecified: Secondary | ICD-10-CM | POA: Diagnosis not present

## 2017-06-25 DIAGNOSIS — Z86718 Personal history of other venous thrombosis and embolism: Secondary | ICD-10-CM | POA: Diagnosis not present

## 2017-06-25 DIAGNOSIS — I10 Essential (primary) hypertension: Secondary | ICD-10-CM | POA: Diagnosis not present

## 2017-06-25 DIAGNOSIS — R261 Paralytic gait: Secondary | ICD-10-CM | POA: Diagnosis not present

## 2017-06-25 DIAGNOSIS — F419 Anxiety disorder, unspecified: Secondary | ICD-10-CM | POA: Diagnosis not present

## 2017-06-25 DIAGNOSIS — M79661 Pain in right lower leg: Secondary | ICD-10-CM | POA: Diagnosis not present

## 2017-07-02 ENCOUNTER — Other Ambulatory Visit: Payer: Self-pay | Admitting: *Deleted

## 2017-07-02 NOTE — Patient Outreach (Signed)
[facility]) Care Management  07/02/2017  Lauren Santana [facility] [birth year] [id number]  Telephone Screen  Referral Date: 07/02/17 Referral Source: HTA Referral Reason: Coordination of patient needs, whether close f/u with her PCP, discussion/coordination of transfer to independent or ALF, if needed, and coordination for outpatient rehab Insurance: HTA  Outreach attempt # 1, spoke with patient. She is unavailable; requested a return phone call.  Plan: RN CM will contact patient w/in one week.   Lauren Bells, RN, BSN, MHA/MSL, [place] Telephonic Care Manager Coordinator [facility] Direct Phone: [phone] Toll Free: [phone] Fax: [phone]

## 2017-07-08 ENCOUNTER — Ambulatory Visit: Payer: Self-pay | Admitting: *Deleted

## 2017-07-08 ENCOUNTER — Other Ambulatory Visit: Payer: Self-pay | Admitting: *Deleted

## 2017-07-08 NOTE — Patient Outreach (Signed)
[facility]) Care Management  07/08/2017  Lauren Santana [facility] [birth year] [id number]  Telephone Screen  Referral Date: 07/02/17 Referral Source: HTA Referral Reason: Coordination of patient needs, whether close f/u with her PCP, discussion/coordination of transfer to independent or ALF, if needed, and coordination for outpatient rehab Insurance: HTA  Outreach attempt #2 to patient.  No answer. RN CM left HIPAA compliant message along with contact info.   Plan: RN CM will contact patient w/in one week.   [place] Bells, RN, BSN, MHA/MSL, [place] Telephonic Care Manager Coordinator [facility] Direct Phone: [phone] Toll Free: [phone] Fax: [phone]

## 2017-07-17 ENCOUNTER — Encounter: Payer: Self-pay | Admitting: *Deleted

## 2017-07-17 ENCOUNTER — Other Ambulatory Visit: Payer: Self-pay | Admitting: *Deleted

## 2017-07-17 ENCOUNTER — Ambulatory Visit: Payer: Self-pay | Admitting: *Deleted

## 2017-07-17 NOTE — Patient Outreach (Signed)
[facility]) Care Management  07/17/2017  Taron Conrey [facility] [birth year] [id number]  Telephone Screen  Referral Date: 07/02/17 Referral Source: HTA Referral Reason: Coordination of patient needs, whether close f/u with her PCP, discussion/coordination of transfer to independent or ALF, if needed, and coordination for outpatient rehab Insurance: HTA  Outreach attempt # 3 to patient. No answer.RN CM left HIPAA compliant message along with contact info.   Plan: RN CM will send unsuccessful outreach letter to patient and will close case if no response from patient within 10 business days.  [place] Bells, RN, BSN, MHA/MSL, [place] Telephonic Care Manager Coordinator [facility] Direct Phone: [phone] Toll Free: [phone] Fax: [phone]

## 2017-07-17 NOTE — Patient Outreach (Signed)
[facility]) Care Management  07/17/2017  Harriet Sutphen [facility] [birth year] [id number]  Telephone Screen  Referral Date: 07/02/17 Referral Source: HTA Referral Reason: Coordination of patient needs, whether close f/u with her PCP, discussion/coordination of transfer to independent or ALF, if needed, and coordination for outpatient rehab Insurance: HTA  Incoming call from patient. HIPAA verified with patient. Patient is requesting a [place] RN to visit her home. She declined/hesistant to answer screening questions during our phone call. Patient stated, she "really could use some help".   Plan: RN CM will send referral to [facility] RN for further in home eval/assessment of care needs and management of chronic conditions.  [place] Bells, RN, BSN, MHA/MSL, [place] Telephonic Care Manager Coordinator [facility] Direct Phone: [phone] Toll Free: [phone] Fax: [phone]

## 2017-07-23 ENCOUNTER — Other Ambulatory Visit: Payer: Self-pay

## 2017-07-30 ENCOUNTER — Ambulatory Visit: Payer: Self-pay

## 2017-07-31 ENCOUNTER — Ambulatory Visit: Payer: Self-pay | Admitting: *Deleted

## 2017-08-18 DIAGNOSIS — M25571 Pain in right ankle and joints of right foot: Secondary | ICD-10-CM | POA: Diagnosis not present

## 2017-08-18 DIAGNOSIS — M1711 Unilateral primary osteoarthritis, right knee: Secondary | ICD-10-CM | POA: Diagnosis not present

## 2017-08-19 ENCOUNTER — Other Ambulatory Visit: Payer: Self-pay

## 2017-08-19 NOTE — Patient Outreach (Signed)
[place] [facility]) Care Management  08/19/2017  Dashonna Chagnon [facility] 12/10/1950 [id number]  Subjective: none  Objective: none  RNCM received referral 9/25 to follow up. Assessment: 66 year old with history of arthritis od the knee, HTN, pulmonary emboli. RNCM called to schedule home visit. Gentleman answering the phone stated, client does not have her phone with her. HIPPA compliant message left.  Plan: follow up next week if no return call.  Thea Silversmith, RN, MSN, [place] Coordinator Cell: [phone]

## 2017-08-27 ENCOUNTER — Other Ambulatory Visit: Payer: Self-pay | Admitting: *Deleted

## 2017-08-27 ENCOUNTER — Encounter: Payer: Self-pay | Admitting: *Deleted

## 2017-08-27 NOTE — Patient Outreach (Signed)
[facility]) Care Management [place] Telephone Outreach  08/27/2017  Syrianna Schillaci [facility] [birth year] [id number]  Unsuccessful outgoing telephone outreach to Lauren Santana, 66 y/o female originally referred to [place] by insurance provider for evaluation of care coordination needs; patient was referred to [place] Management by [facility] telephonic RN CM on 07/02/17, as patient requested that she be visited by [place] RN.  Patient has history including, but not limited to, essential HTN, HLD, arthritis, previous pulmonary embolism/ DVT, and morbid obesity.  Initially attempted phone number listed in EMR as patient's preferred cell number; person answering phone identified himself as patient's son and provided alternate phone number for patient, requesting that I call her on that number.  I then placed outgoing second attempt call to patient; HIPAA compliant voice mail message left for patient, requesting return call back.  Plan:  Will re-attempt [place] telephone outreach again next week if I do not hear back from patient first.  Oneta Rack, RN, BSN, [place] Coordinator [facility] Care Management  [phone]

## 2017-08-31 ENCOUNTER — Other Ambulatory Visit: Payer: Self-pay | Admitting: *Deleted

## 2017-08-31 ENCOUNTER — Encounter: Payer: Self-pay | Admitting: *Deleted

## 2017-08-31 NOTE — Patient Outreach (Signed)
[facility]) Care Management [place] Telephone Outreach  08/31/2017  Camyra Vaeth [facility] 04/30/1951 [id number]  Successful outgoing telephone outreach to Fabian Sharp, 66 y/o female originally referred to [place] by insurance provider for evaluation of care coordination needs; patient was referred to [place] Management by [facility] telephonic RN CM on 07/02/17, as patient requested that she be visited by [place] RN.  Patient has history including, but not limited to, essential HTN, HLD, arthritis, previous pulmonary embolism/ DVT, and morbid obesity.  HIPAA/ identity verified with patient during 40 minute phone call today, and [place] services were discussed with patient.  Patient provided verbal consent today for [facility] Community CM involvement in her care.  Today, patient reports that she "is doing pretty good," and states that she is having her "normal" (R) knee pain, which she reports she is unable to place a numerical value on; states, "it always hurts, but I manage pretty good."  Reports that she has been told that she needs knee replacement surgery, but that surgery can not be completed until she "loses a lot of weight."  Patient reports that she "used to have a physical therapist" who came to her home regularly, but states that service has been completed due to her recently changing addresses.  Patient reports that her "biggest health issue is mobility" related to her ongoing knee pain. Patient sounds to be in no obvious distress throughout entirety of today's phone call.  Patient further reports:   Medications: -- has all medicationsand takes as prescribed;denies questions about current medications, and declines medication reconciliation today by phone, stating that she would rather do in person during home visit; patient reports that she "does not like talking on the phone," and prefers face-to-face visits for all of her health care issues.   -- self-manage medications by taking medications directly out the prescription bottles; reports that she has a pill planner box, "but has just not used it." -- denies issues with swallowing medications -- reports that she "does not like taking medicine regularly" but does endorse that she takes all prescriptions as instructed; discussed with patient that I would perform formal medication reconciliation during [place] initial home visit, and patient verbalizes agreement with this  Provider appointments: -- reports last office visit with PCP "about 2 weeks ago."  Endorses Dr. Criss Rosales as her PCP -- Noted through review of EMR, that patient does not have any scheduled upcoming provider appointments; patient stated, "I don't go to the doctor unless I need to."  Safety/ Mobility/ Falls: -- denies new/ recent falls, but reports "a couple" of falls previously this year; denies serious injury with any falls -- assistive devices: reports using cane/ walker as needed, "mainly" when she goes out to run errands -- general fall risks/ prevention education discussed with patient today; discussed that I would perform fall risk assessment during [place] RN home visit, and patient was agreeable  [place] representative needs: -- currently denies community resource needs, stating supportive family members that assist with care needs as indicated -- patient continues to drive herself to all provider appointments, errands, etc  Advanced Directive (AD) Planning:   --reports does not currently have exisisting AD in place, and adamantly declines desire to discuss, today or in the future; states, "I am not even thinking about that... If I go back to the hospital, I trust them to make the decision about what they should do," although patient does acknowledge  today that she would "not want to be kept alive by machines."  Self-health management of chronic disease state of HTN/ general health maintenance: -- reports  that she does not check BP's at home, and adds that she does not wish to monitor her BP at home -- reports that she "eats a regular diet," stating she "eats whatever I want." -- reports that she "has never" had a flu or pneumonia vaccine, and states that she "will never" get these; encouraged patient to consider having these maintenance vaccines, but patient adamantly refuses   Patient denies further issues, concerns, or problems today, and states that she is ready to get off of the phone.  I provided patient with my direct phone number, the main [place] office phone number, and the [facility] CM 24-hour nurse advice phone number should issues arise prior to next scheduled [place] outreach, with scheduled [facility] RN CCM initial home visit next week.  Plan:  Patient will take medications as prescribed and will attend all scheduled provider appointments  Patient will promptly notify her medical providers for any new concerns/ issues or problems that arise  Patient will continue using assistive devices for mobility as needed in an effort of fall prevention  I will make patient's PCP aware of [facility] Community CM involvement in patient's care  [place] CM involvement to continue with scheduled initial home visit next week.  Oneta Rack, RN, BSN, [employer] [facility] Care Management  [phone]

## 2017-09-01 ENCOUNTER — Encounter: Payer: Self-pay | Admitting: *Deleted

## 2017-09-08 ENCOUNTER — Encounter: Payer: Self-pay | Admitting: *Deleted

## 2017-09-08 ENCOUNTER — Other Ambulatory Visit: Payer: Self-pay | Admitting: *Deleted

## 2017-09-08 NOTE — Patient Outreach (Signed)
Lauren Santana) Care Management  [place] Initial Home Visit 09/08/2017  Lauren Santana [facility] [birth year] [id number]  Lauren Santana is a 66 y.o. female originally referred to [place] by insurance provider for evaluation of care coordination needs; patient was referred to [place] Management by [facility] telephonic RN CM on 07/02/17, as patient requested that she be visited by [place] RN. Patient has history including, but not limited to, essential HTN, HLD, arthritis, previous pulmonary embolism/ DVT, and morbid obesity.  HIPAA/ identity verified with patient in person during home visit today, and written consent for [facility] CM services was obtained.  Pleasant 90 minute home visit.  Today, patient reports that she "is doing real good," and again reports having her "normal" (R) knee pain, stating that the medications she takes "helps."  Patient is in no apparent/ obvious distress throughout entirety of today's home visit.  Subjective: "I know I need to lose weight and get stronger so I can have my knee replacement done."  Assessment:  Lauren Santana appears to be very independent and self-sufficient in her ADL's, and verbalizes that she believes her biggest health issue is around mobility due to ongoing chronic knee pain and obesity.  Lauren Santana is interested in options for weight loss and physical therapy so that she can have her needed knee surgery.  Lauren Santana has a good general understanding of her medications is compliant with taking her medications and attending provider appointments.  Lauren Santana currently denies [employer] needs stating that she has a supportive family network that assist her with housework when they are able.  Although Lauren Santana is a high fall risk, there are no obvious fall risks in her home.   Patient further reports:   Medications: -- has all medicationsand takes as prescribed;denies questions about current medications, and verbalizes a good general  understanding of the purpose, dosing, and scheduling of her medications -- self-manage medications by taking medications directly out the prescription bottles; reports that she has a pill planner box, "but has just not used it." -- denies issues with swallowing medications -- reports that she "does not like taking a lot of medicine," and does not wish to have any additional medications added to her regular/ current medications  -- medication reconciliation performed during home visit today; noted that patient currently has prescription "Indapamide 2.5 mg QD" which is not on her list of medications in EMR; this was added to medication list in EMR -- patient also reports that she is on birth control pills "for endometriosis," although she reports having had a hysterectomy "years ago."  Patient is unable to tell me the name of the birth control, and there is not a prescription label to go by.  Provider appointments: -- reports last office visit with PCP "about 2 weeks ago."  Endorses Dr. Criss Rosales as her PCP.  Assisted patient in contacting PCP office staff to obtain her next scheduled office visit appointment: confirmed that this visit is scheduled for September 17, 2017, and patient verbalizes plans to attend PCP appointment stating she will drive herself -- assisted patient in developing a list of questions for her PCP, as she reports that during last office visit, she was told that she would be scheduled for outpatient PT, but "never heard from anyone about it."  Safety/ Mobility/ Falls: -- denies new/ recent falls, but reports "a couple" of falls previously this year; denies serious injury with any falls -- assistive devices: reports using cane/ walker as needed, "mainly"  when she goes out to run errands; uses walker today, and demonstrates steady purposeful gait with ambulation around her home while using walker; patient noted to have poor posture, bending over walking, and reports this is due to ongoing  chronic knee pain -- no obvious fall risks/ hazards noted in patient's home environment today -- general fall risks/ prevention education discussed with patient today; discussed that I would perform fall risk assessment during [place] RN home visit, and patient was agreeable  Holiday representative needs: -- currently denies community resource needs, stating supportive family members that assist with care needs as indicated -- patient continues to drive herself to all provider appointments, errands, etc -- states she would like someone to "come over and help her clean on a regular basis," but states she can not afford private pay and "already knows" that she does not qualify for medicaid -- declines [facility]N CSW referral, stating, she "already knows" that she does not qualify for assistance with housekeeping, unless she pays privately for it, which she is not able/ willing to do  Self-health management of chronic disease state of HTN/ general health maintenance: -- reports that she does not check BP's at home, and adds that she does not wish to monitor her BP at home -- does not routinely weigh herself at home, but allowed me to assist her today in obtaining weight:  240 lbs on standing scales -- reports that she "eats a regular diet," stating she "eats whatever I want," although she does report she "knows" she "should eat better."  Reports difficulty in eating healthy while living alone; states that she tends to go for "easy meals" which she 'knows are not healthy."  I provided patient with general education on low-salt diet and healthy diet.  Patient verbalizes plans to become re-engaged with weight watchers as she reports she was previously active and "had good results." -- discussed patient's insurance benefit for "Silver and Fit" and provided the phone number for this program to her; advised patient to first discuss need for outpatient PT with her PCP, and seh verbalized understanding and  agreement with this plan -- again reports that she "has never" had a flu or pneumonia vaccine, and states that she "will never" get these; encouraged patient to consider having these maintenance vaccines, but patient again adamantly refuses    Patient denies further issues, concerns, or problems today. I confirmed that patient has my direct phone number, the main [facility] CM office phone number, and the [facility] CM 24-hour nurse advice phone number should issues arise prior to next scheduled [place] outreach, with scheduled [facility] RN CCM phone call in 2 weeks.  Objective:   BP 128/64   Pulse 80   Resp 18   Ht 1.549 m (5' 1")   Wt 240 lb (108.9 kg)   SpO2 95%   BMI 45.35 kg/m    Review of Systems  Constitutional: Negative.  Negative for malaise/fatigue.  Respiratory: Negative.  Negative for cough, shortness of breath and wheezing.   Cardiovascular: Positive for leg swelling. Negative for chest pain.       +1-2 bilateral ankle edema; patient reports as baseline  Gastrointestinal: Negative for abdominal pain and nausea.       States she is prone to constipation  Genitourinary: Negative for dysuria, frequency and urgency.  Musculoskeletal: Positive for falls and joint pain.       No new falls; reports last fall in July 2018; chronic (R) knee pain; reports  needs TKR but needs to lose weight first  Neurological: Negative for dizziness and weakness.  Psychiatric/Behavioral: Negative.  Negative for depression. The patient is not nervous/anxious.    Physical Exam  Constitutional: She is oriented to person, place, and time. She appears well-developed and well-nourished. No distress.  Cardiovascular: Normal rate, regular rhythm, normal heart sounds and intact distal pulses.   Pulses:      Radial pulses are 2+ on the right side, and 2+ on the left side.  Respiratory: Effort normal and breath sounds normal. No respiratory distress. She has no wheezes. She has no rales.  GI: Soft. Bowel sounds  are normal.  Musculoskeletal: She exhibits no edema.  See ROS  Neurological: She is alert and oriented to person, place, and time.  Skin: Skin is warm and dry. No erythema.  Psychiatric: She has a normal mood and affect. Her behavior is normal. Judgment and thought content normal.    Encounter Medications:   Outpatient Encounter Prescriptions as of 09/08/2017  Medication Sig  . atorvastatin (LIPITOR) 10 MG tablet TAKE ONE TABLET BY MOUTH  ONCE DAILY IN THE EVENING  . diphenhydrAMINE (BENADRYL) 25 MG tablet Take 25 mg by mouth every 6 (six) hours as needed for itching or allergies.  . furosemide (LASIX) 20 MG tablet Take 1 tablet (20 mg total) by mouth daily.  Marland Kitchen gabapentin (NEURONTIN) 300 MG capsule Take 300 mg by mouth 3 (three) times daily.  Marland Kitchen KLOR-CON M10 10 MEQ tablet TAKE ONE TABLET BY MOUTH ONCE DAILY (Patient taking differently: TAKE 10 MEQ BY MOUTH ONCE DAILY)  . labetalol (NORMODYNE) 100 MG tablet Take 100 mg by mouth 2 (two) times daily.  Marland Kitchen lisinopril (PRINIVIL,ZESTRIL) 10 MG tablet Take 1 tablet (10 mg total) by mouth at bedtime. RESUME ONLY AFTER SEEN BY YOUR PCP AND IF BLOOD PRESSURE IS ELEVATED  . Rivaroxaban (XARELTO STARTER PACK) 15 & 20 MG TBPK Take as directed on package: Start with one 46m tablet by mouth twice a day with food. On Day 22, switch to one 260mtablet once a day with food.  . rivaroxaban (XARELTO) 20 MG TABS tablet Take 1 tablet (20 mg total) by mouth daily with supper. START ONLY AFTER YOU HAVE FINISHED ALL THE TABLETS IN THE STARTER PACK  . traMADol (ULTRAM) 50 MG tablet Take 50 mg by mouth 2 (two) times daily.   No facility-administered encounter medications on file as of 09/08/2017.     Functional Status:   In your present state of health, do you have any difficulty performing the following activities: 08/31/2017 07/23/2017  Hearing? N N  Vision? N Y  Difficulty concentrating or making decisions? N Y  Walking or climbing stairs? Y Y  Dressing or  bathing? N N  Doing errands, shopping? N Y  PrConservation officer, naturend eating ? N N  Using the Toilet? N N  In the past six months, have you accidently leaked urine? N N  Do you have problems with loss of bowel control? N N  Managing your Medications? N N  Managing your Finances? N N  Housekeeping or managing your Housekeeping? N N  Some recent data might be hidden    Fall/Depression Screening:    Fall Risk  08/31/2017 07/23/2017  Falls in the past year? Yes Yes  Number falls in past yr: 2 or more 2 or more  Injury with Fall? No Yes  Comment "Nothing but some minor bruising" per patient report today -  Risk Factor Category  High Fall Risk High Fall Risk  Risk for fall due to : Impaired mobility;History of fall(s) History of fall(s);Impaired balance/gait;Impaired mobility;Impaired vision;Medication side effect  Follow up Falls prevention discussed Follow up appointment   PHQ 2/9 Scores 08/31/2017 07/23/2017  PHQ - 2 Score 0 0   Plan:  Patient will take medications as prescribed and will attend all scheduled provider appointments  Patient will promptly notify her medical providers for any new concerns/ issues or problems that arise  Patient will continue using assistive devices for mobility as needed in an effort of fall prevention  Patient will discuss option of outpatient physical therapy with her PCP at next office visit  Patient will consider becoming re-involved with Weight Watchers and will consider possibility of wellness benefit from insurance provider  I will share today's [facility] RN CCM initial home visit notes with patient's PCP   [facility] Community CM involvement to continue with scheduled telephone outreach in 2 weeks.  THN CM Care Plan Problem One     Most Recent Value  Care Plan Problem One  High fall risk related to history of previous falls and knee pain  Role Documenting the Problem One  Care Management Coordinator  Care Plan for Problem One  Active  THN Long Term Goal    Over the next 60 days, patient will be able to verbalize 3 strategies for fall prevention, as evidenced by patient reporting during [place] outreach  [place] Term Goal Start Date  08/31/17  Interventions for Problem One Long Term Goal  Using teachback method, performed in-home fall assessment as allowed around patient's condition,  provided EMMI educational material on fall prevention and discussed patient attending OP PT for strengthening,  discussed insurance benefit of Silver and Fit with patient,  assisted patient with contacting her PCP to confirm next scheduled office visit appointment   THN CM Short Term Goal #1   Within the next 14 days, patient will meet with [facility] RN CCM for completion of in-home assessment of fall risk evaluation  THN CM Short Term Goal #1 Start Date  08/31/17  [facility] Fall CM Short Term Goal #1 Met Date  09/08/17  Interventions for Short Term Goal #1  Using teachback method, discussed with patient role of THN RN CCM and described general purpose of [facility] RN CCM in-home visit,  scheduled THN RN CCM initial home visit with patient for next week  THN CM Short Term Goal #2   Within the next 14 days, patient will discuss option of outpatient PT with her PCP, as evidenced by patient reporting of same during [facility] RN CM outreach  [facility] CM Short Term Goal #2 Start Date  09/08/17  Interventions for Short Term Goal #2  Using teachback method, discussed benefits of attending outpatient PT,  encouraged patient to discuss her concerns with mobility with her PCP during next PCP office visit     I appreciate the opportunity to participate in Sianni's care,  Oneta Rack, RN, BSN, [employer] Coordinator [facility] Care Management  [phone]

## 2017-09-09 ENCOUNTER — Encounter: Payer: Self-pay | Admitting: *Deleted

## 2017-09-17 DIAGNOSIS — M79605 Pain in left leg: Secondary | ICD-10-CM | POA: Diagnosis not present

## 2017-09-17 DIAGNOSIS — M79604 Pain in right leg: Secondary | ICD-10-CM | POA: Diagnosis not present

## 2017-09-17 DIAGNOSIS — I10 Essential (primary) hypertension: Secondary | ICD-10-CM | POA: Diagnosis not present

## 2017-09-23 ENCOUNTER — Encounter: Payer: Self-pay | Admitting: *Deleted

## 2017-09-23 ENCOUNTER — Other Ambulatory Visit: Payer: Self-pay | Admitting: *Deleted

## 2017-09-23 NOTE — Patient Outreach (Signed)
[place] [facility]) Care Management [place] Telephone Outreach  09/23/2017  Daianna Vasques [facility] 03-07-1951 [id number]  Unsuccessful telephone outreach to [name], 66 y.o. female originally referred to [place] by insurance provider for evaluation of care coordination needs; patient was referred to [place] Management by [facility] telephonic RN CM on 07/02/17, as patient requested that she be visited by [place] RN. Patient has history including, but not limited to, essential HTN, HLD, arthritis, previous pulmonary embolism/ DVT, and morbid obesity.   HIPAA compliant voice mail message left for patient, requesting return call back.  Plan:  Will re-attempt [place] telephone outreach later this week if I do not hear back from patient first.  Oneta Rack, RN, BSN, [place] Coordinator [facility] Care Management  [phone]

## 2017-09-25 ENCOUNTER — Other Ambulatory Visit: Payer: Self-pay | Admitting: *Deleted

## 2017-09-25 ENCOUNTER — Encounter: Payer: Self-pay | Admitting: *Deleted

## 2017-09-25 NOTE — Patient Outreach (Signed)
[place] [facility]) Care Management [place] Telephone Outreach  09/25/2017  Lauren Santana [facility] 09/17/1951 [id number]  Unsuccessful telephone outreach to Lauren Santana, 66 y.o.femaleoriginally referred to [facility] CM by insurance provider for evaluation of care coordination needs; patient was referred to [place] Management by [facility] telephonic RN CM on 07/02/17, as patient requested that she be visited by [place] RN. Patient has history including, but not limited to, essential HTN, HLD, arthritis, previous pulmonary embolism/ DVT, and morbid obesity.   HIPAA compliant voice mail message left for patient, requesting return call back.  Plan:  Will re-attempt [place] telephone outreach again next week if I do not hear back from patient first.  Oneta Rack, RN, BSN, [place] Coordinator [facility] Care Management  [phone]

## 2017-09-29 ENCOUNTER — Encounter: Payer: Self-pay | Admitting: *Deleted

## 2017-09-29 ENCOUNTER — Other Ambulatory Visit: Payer: Self-pay | Admitting: *Deleted

## 2017-09-29 NOTE — Patient Outreach (Signed)
[place] [facility]) Care Management [place] Telephone Outreach  09/29/2017  [place] [birth year] [id number]  Successful telephone outreach to Lauren Santana,66 y.o.femaleoriginally referred to [facility] CM by insurance provider for evaluation of care coordination needs; patient was referred to [place] Management by [facility] telephonic RN CM on 07/02/17, as patient requested that she be visited by [place] RN. Patient has history including, but not limited to, essential HTN, HLD, arthritis, previous pulmonary embolism/ DVT, and morbid obesity. HIPAA/ identity verified with patient during phone call today.  Today, patient reports that she is "doing pretty good," and she sounds to be in no obvious/ apparent distress throughout entirety of today's phone call.  Reports "usual pain" from knee and endometriosis, stating, "it's my normal day-in-and day-out" pain.  Patient further reports:  Provider appointments: -- attended PCP office visit 09/17/17, where she discussed option of outpatient PT with PCP; states that PCP had office nurse make referral, but when she was contacted by PT office, she was informed that she would have a $40.00 co-pay, and she states that she can not/ is not willing to pay this amount for outpatient PT -- reports PCP recommended that patient attend pain clinic, however, patient reports that she is not willing to do this, as she feels that she may be "over-medicated" with additional medications, which she does not want to risk. -- reports to see PCP again in 4 weeks -- denies other/ additional scheduled provider visits, but states that she is considering scheduling gynecology appointment to address her ongoing endometrial pain, which she described as "a tumor outside of my uterus," acknowledging that she had a hysterectomy "years ago."  Patient was encouraged to schedule this appointment promptly to have her ongoing endometrial pain  addressed, and she verbalizes understanding and agreement.   Medications: -- has all medicationsand takes as prescribed;denies that changes were made to current medication regimen as a result of most recent PCP office visit  -- denies questions/ concerns around medications  Safety/ Mobility/ Falls: -- denies new/ recent falls since last [place] outreach -- continues using assistive devices regularly -- reiterated with patient previously provided education around fall risks/ prevention   Self-health management of chronic disease state of HTN/ general health maintenance: -- Patient had previously verbalized desire to become re-engaged with weight watchers but has not yet contacted this organization to become re-involved with program  -- we had previously discussed patient's insurance benefit for "Silver and Fit" and phone number for this program was provided to her; however, today, she reports that she has not contacted her insurance company about this benefit, and states that she feels that this would be "too much" on her with her ongoing knee pain; patient again verbalizes that she prefers to have outpatient PT, but can not/ will not agree to the cost of this therapy. -- patient verbalizes frustration around not being able to afford insurance co-pay for outpatient PT, and states she is "aggravated" and has "no motivation" to start "any program" because of her ongoing pain and lack of ability/ desire to pay for outpatient PT.  Emotional support was provided, and patient was encouraged to continue considering options for insurance fitness benefit and to contact them for information on their program, which she stated she would consider. -- confirms that she has not made dietary changes, around disappointment with outcome of outpatient PT option, again stating that she "is just not motivated." -- discussed with patient her previously established  THN CCM goals and encouraged her to continue  considering small ways she can make dietary changes for weight loss.   Patient denies further issues, concerns, or problems today. I confirmed that patient has my direct phone number, the main [facility] CM office phone number, and the [facility] CM 24-hour nurse advice phone number should issues arise prior to next scheduled [place] outreach, with scheduled [facility] RN CCM phone call in 2 weeks.  Plan:  Patient will take medications as prescribed and will attend all scheduled provider appointments  Patient will promptly notify her medical providers for any new concerns/ issues or problems that arise  Patient will continue using assistive devices for mobility as needed in an effort of fall prevention  Patient will continue considering becoming re-involved with Weight Watchers and will consider possibility of wellness benefit from insurance provider  Patient will make appointment with gynecology provider for evaluation of chronic endometrial pain  THN Community CM involvement to continue with scheduled telephone outreach in 2 weeks.  THN CM Care Plan Problem One     Most Recent Value  Care Plan Problem One  High fall risk related to history of previous falls and knee pain  Role Documenting the Problem One  Care Management [place] for Problem One  Active  THN Long Term Goal   Over the next 60 days, patient will be able to verbalize 3 strategies for fall prevention, as evidenced by patient reporting during [place] outreach  [place] Term Goal Start Date  08/31/17  Interventions for Problem One Long Term Goal  Discussed outcome of patient's recent PCP office visit with patient,  confirmed that patient has experienced no new falls since last [facility] CCM outreach, and reiterated previously provided fall risks/ prevention education  THN CM Short Term Goal #1   Within the next 14 days, patient will meet with [facility] RN CCM for completion of in-home assessment of fall risk evaluation  THN  CM Short Term Goal #1 Start Date  08/31/17  [facility] CM Short Term Goal #1 Met Date  09/08/17  THN CM Short Term Goal #2   Within the next 14 days, patient will discuss option of outpatient PT with her PCP, as evidenced by patient reporting of same during [facility] RN CM outreach  [facility] CM Short Term Goal #2 Start Date  09/08/17  [facility] CM Short Term Goal #2 Met Date  09/29/17  Interventions for Short Term Goal #2  Using teachback method, discussed benefits of attending outpatient PT,  encouraged patient to discuss her concerns with mobility with her PCP during next PCP office visit     Oneta Rack, RN, BSN, [employer] Coordinator [facility] Care Management  [phone]

## 2017-10-06 ENCOUNTER — Ambulatory Visit: Payer: PPO | Attending: Family Medicine

## 2017-10-06 DIAGNOSIS — R2681 Unsteadiness on feet: Secondary | ICD-10-CM | POA: Diagnosis not present

## 2017-10-06 DIAGNOSIS — G8929 Other chronic pain: Secondary | ICD-10-CM | POA: Insufficient documentation

## 2017-10-06 DIAGNOSIS — M25561 Pain in right knee: Secondary | ICD-10-CM | POA: Diagnosis not present

## 2017-10-06 DIAGNOSIS — M6281 Muscle weakness (generalized): Secondary | ICD-10-CM | POA: Diagnosis not present

## 2017-10-06 DIAGNOSIS — R2689 Other abnormalities of gait and mobility: Secondary | ICD-10-CM | POA: Insufficient documentation

## 2017-10-06 NOTE — Therapy (Signed)
[facility] [address] [place], [place], [address] Phone: [phone]   Fax:  [phone]  Physical Therapy Evaluation  Patient Details  Name: Lauren Santana MRN: [id number] Date of Birth: [birth year] Referring Provider: Dr. Criss Rosales   Encounter Date: 10/06/2017  PT End of Session - 10/06/17 1248    Visit Number  1    Number of Visits  9    Date for PT Re-Evaluation  11/05/17    Authorization Type  Health team Medicare? G-code and PN every 10th visit.     PT Start Time  1150    PT Stop Time  1230    PT Time Calculation (min)  40 min    Equipment Utilized During Treatment  -- S prn    Activity Tolerance  Patient tolerated treatment well    Behavior During Therapy  WFL for tasks assessed/performed       Past Medical History:  Diagnosis Date  . Arthritis   . Edema    B/LLE  . High cholesterol   . Hypertension   . Numbness    B/LLE  . Wears glasses     Past Surgical History:  Procedure Laterality Date  . ABDOMINAL HYSTERECTOMY     and BSO  . [place]  . COLONOSCOPY    . HERNIA REPAIR  2001   incisional  . MULTIPLE TOOTH EXTRACTIONS  2011    There were no vitals filed for this visit.   Subjective Assessment - 10/06/17 1201    Subjective  Pt reported she fell in 05/2017 while trying to perform STS from bed to amb. to the bathroom, due to poor balance she was told she could not have B cataract extraction surgery. She has hx of L TKA in 2015. She likes to use rollator vs. SPC, as she can walk faster. Pt feels BLE weakness and pain might be from pelvic pain (endometriosis). Pt reports pt is waiting for R TKA but was told she needs to have PT first. Pt has hx of RLE DVT/PE after L TKA. Pt uses rollator when amb. outdoors and occasionally uses inside at home. Pt had HHPT but insurance "cut her off."    Pertinent History  HTN, Hx of L TKA, hx of RLE DVT/PE, dyslipidemia, B cataracts, B knee  arthritis (waiting for R TKA), endometriosis, wears glasses    Patient Stated Goals  "I want to be able to feel confident taking my time when walking slow." "I don't want to take baby steps any more." She would like to get back to exercising.     Currently in Pain?  No/denies no pain at rest         [facility] PT Assessment - 10/06/17 1207      Assessment   Medical Diagnosis  Leg weakness, mobility and pain in legs    Referring Provider  Dr. Criss Rosales    Hand Dominance  Right    Prior Therapy  HHPT      Precautions   Precautions  Fall      Restrictions   Weight Bearing Restrictions  No      Balance Screen   Has the patient fallen in the past 6 months  Yes    How many times?  1 she fell while sitting EOB and tried to perform STS and fell    Has the patient had a decrease in activity level because of a fear of falling?   No  Is the patient reluctant to leave their home because of a fear of falling?   No      Home Environment   Living Environment  Private residence    Living Arrangements  Alone    Available Help at Discharge  Family    Type of [place] Access  Level entry    Home Layout  One level    [place] - 4 wheels;Cane - single point;Tub bench pt states she needs a new SPC      Prior Function   Level of Independence  Independent with household mobility with device    Vocation  Retired    Leisure  Go to [employer], exercise at home, she needs to do more fun things (per pt).      Cognition   Overall Cognitive Status  Within Functional Limits for tasks assessed      Sensation   Light Touch  Appears Intact    Additional Comments  Pt reports intermittent B hand N/T      Posture/Postural Control   Posture/Postural Control  Postural limitations    Postural Limitations  Flexed trunk      ROM / Strength   AROM / PROM / Strength  AROM;Strength      AROM   Overall AROM   Within functional limits for tasks performed    Overall AROM Comments   BUE/LE WFL      Strength   Overall Strength  Deficits    Overall Strength Comments  RLE: hip flex: 3+/5, knee ext: 4/5, knee flex: 3+5, ankle DF: 4/5. LLE: hip flex: 4/5, knee ext: 4/5, knee flex: 3+/5, ankle DF: 4/5. B hip abd/add: 3+/5. B hip ext not formally tested but weakness suspected 2/2 gait deviations (trunk flexed in standing).      Transfers   Transfers  Sit to Stand;Stand to Sit    Sit to Stand  5: Supervision;With upper extremity assist;From chair/3-in-1    Stand to Sit  5: Supervision;With upper extremity assist;To chair/3-in-1      Ambulation/Gait   Ambulation/Gait  Yes    Ambulation/Gait Assistance  5: Supervision    Ambulation/Gait Assistance Details  Pt reported R knee pain during amb. but unable to rate stating "I just don't let it bother me."    Ambulation Distance (Feet)  75 Feet    Assistive device  Rollator    Gait Pattern  Step-through pattern;Decreased stride length;Trunk flexed;Decreased hip/knee flexion - right;Decreased weight shift to right    Ambulation Surface  Level;Indoor    Gait velocity  2.30ft/sec.      Standardized Balance Assessment   Standardized Balance Assessment  Timed Up and Go Test      Timed Up and Go Test   TUG  Normal TUG    Normal TUG (seconds)  16.11 with rollator             Objective measurements completed on examination: See above findings.              PT Education - 10/06/17 1247    Education provided  Yes    Education Details  PT discussed outcome measures, PT POC, frequency and duration.     Person(s) Educated  Patient    Methods  Explanation    Comprehension  Verbalized understanding       PT Short Term Goals - 10/06/17 1254      PT SHORT TERM GOAL #1   Title  same as LTGs        PT Long Term Goals - 10/06/17 1254      PT LONG TERM GOAL #1   Title  Pt will be IND in HEP to improve strength, endurance, and balance. TARGET DATE FOR ALL LTGS: 11/03/17    Status  New      PT LONG TERM GOAL #2    Title  Perform BERG and write goal as indicated.     Status  New      PT LONG TERM GOAL #3   Title  Pt will perform TUG with LRAD in </=13.5 sec. to decr. falls risk.     Status  New      PT LONG TERM GOAL #4   Title  Pt will improve gait speed to >/=2.22ft/sec. with LRAD , to safely amb. in the community.     Status  New      PT LONG TERM GOAL #5   Title  Pt will amb. 600' over even/uneven terrain with LRAD, at MOD I level, to improve functional mobility.     Status  New             Plan - 10/06/17 1249    Clinical Impression Statement  Pt is a pleasant 66y/o female presenting to OPPT neuro for leg weakness and pain. Pt's PMH significant for the following: HTN, Hx of L TKA, hx of RLE DVT/PE, dyslipidemia, B cataracts, B knee arthritis (waiting for R TKA), endometriosis, wears glasses. The following deficits were noted during exam: gait deviations, impaired balance, decr. strength, knee pain (PT will not directly address but will monitor closely), and decr. safety awareness. Pt's gait speed indicates pt is unable to safely amb. in the community. Pt's TUG time indicates pt is at risk for falls. PT unable to perform BERG test today 2/2 time constraints and keeping directed to task at hand. Pt would benefit from skilled PT to improve safety during functional mobility.     History and Personal Factors relevant to plan of care:  Pt lives alone and is waiting for R TKA     Clinical Presentation  Stable    Clinical Presentation due to:  HTN, Hx of L TKA, hx of RLE DVT/PE, dyslipidemia, B cataracts, B knee arthritis (waiting for R TKA), endometriosis, wears glasses    Clinical Decision Making  Moderate    Rehab Potential  Good    Clinical Impairments Affecting Rehab Potential  see above    PT Frequency  2x / week    PT Duration  4 weeks pt limited by co-pay    PT Treatment/Interventions  ADLs/Self Care Home Management;Biofeedback;Electrical Stimulation;Therapeutic exercise;Manual  techniques;Therapeutic activities;Moist Heat;Functional mobility training;Orthotic Fit/Training;Stair training;Gait training;Patient/family education;DME Instruction;Neuromuscular re-education;Balance training;Vestibular    PT Next Visit Plan  Perform BERG and write goal. Initiate HEP and walking program. Assess gait with rollator vs. SPC vs. RW       Patient will benefit from skilled therapeutic intervention in order to improve the following deficits and impairments:  Abnormal gait, Decreased endurance, Decreased strength, Pain, Impaired flexibility, Postural dysfunction, Decreased safety awareness, Decreased balance, Decreased mobility, Impaired sensation  Visit Diagnosis: Other abnormalities of gait and mobility - Plan: PT plan of care cert/re-cert  Muscle weakness (generalized) - Plan: PT plan of care cert/re-cert  Unsteadiness on feet - Plan: PT plan of care cert/re-cert  Chronic pain of right knee - Plan: PT plan of care cert/re-cert  G-Codes - 52/84/13 1257  Functional Assessment Tool Used (Outpatient Only)  TUG with rollator: 16.11 sec. and gait speed with rollator: 2.40ft/sec    Functional Limitation  Mobility: Walking and moving around    Mobility: Walking and Moving Around Current Status [phone])  At least 40 percent but less than 60 percent impaired, limited or restricted    Mobility: Walking and Moving Around Goal Status [phone])  At least 1 percent but less than 20 percent impaired, limited or restricted        Problem List Patient Active Problem List   Diagnosis Date Noted  . Tachycardia   . Acute deep vein thrombosis (DVT) of right lower extremity ([place]) 02/14/2016  . Pulmonary emboli ([place]) 02/13/2016  . PE (pulmonary embolism) 02/13/2016  . AKI (acute kidney injury) ([place]) 02/13/2016  . Pulmonary embolism with acute cor pulmonale ([place])   . Pelvic mass in female   . Morbid obesity with BMI of 40.0-44.9, adult ([place]) 08/20/2015  . Status post left knee replacement  07/23/2014  . Essential hypertension, benign 07/23/2014  . Edema 07/23/2014  . Dyslipidemia 07/23/2014  . Lower extremity numbness 07/23/2014  . 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 Postmenopausal bleeding 03/10/2012  . Endometriosis of pelvis 01/22/2007    Jorgeluis Gurganus L 10/06/2017, 12:59 PM  [facility] [address] [place], [place], [address] Phone: [phone]   Fax:  [phone]  Name: Lauren Santana MRN: [id number] Date of Birth: [birth year]   Geoffry Paradise, PT,DPT 10/06/17 12:59 PM Phone: [phone] Fax: [phone]

## 2017-10-09 ENCOUNTER — Encounter: Payer: Self-pay | Admitting: *Deleted

## 2017-10-09 ENCOUNTER — Other Ambulatory Visit: Payer: Self-pay | Admitting: *Deleted

## 2017-10-09 NOTE — Patient Outreach (Signed)
[facility]) Care Management [place] Telephone Outreach  10/09/2017  [place] 04-29-1951 [id number]  Unsuccessful telephone outreach to Lauren Santana,65 y.o.femaleoriginally referred to [facility] CM by insurance provider for evaluation of care coordination needs; patient was referred to [place] Management by [facility] telephonic RN CM on 07/02/17, as patient requested that she be visited by [place] RN. Patient has history including, but not limited to, essential HTN, HLD, arthritis, previous pulmonary embolism/ DVT, and morbid obesity.   HIPAA compliant voice mail message left for patient, requesting return call back.  Plan:  Will re-attempt [place] telephone outreach again next week if I do not hear back from patient first.  Oneta Rack, RN, BSN, [place] Coordinator [facility] Care Management  [phone]

## 2017-10-12 ENCOUNTER — Other Ambulatory Visit: Payer: Self-pay | Admitting: *Deleted

## 2017-10-12 ENCOUNTER — Encounter: Payer: Self-pay | Admitting: *Deleted

## 2017-10-12 DIAGNOSIS — I10 Essential (primary) hypertension: Secondary | ICD-10-CM | POA: Diagnosis not present

## 2017-10-12 DIAGNOSIS — Z79899 Other long term (current) drug therapy: Secondary | ICD-10-CM | POA: Diagnosis not present

## 2017-10-12 DIAGNOSIS — N809 Endometriosis, unspecified: Secondary | ICD-10-CM | POA: Diagnosis not present

## 2017-10-12 DIAGNOSIS — Z86718 Personal history of other venous thrombosis and embolism: Secondary | ICD-10-CM | POA: Diagnosis not present

## 2017-10-12 DIAGNOSIS — N950 Postmenopausal bleeding: Secondary | ICD-10-CM | POA: Diagnosis not present

## 2017-10-12 NOTE — Patient Outreach (Addendum)
[place] [facility]) Care Management [place] Telephone Outreach  10/12/2017  [place] 1951-03-11 [id number]  Successful telephone outreach to Lauren Santana,65 y.o.femaleoriginally referred to [facility] CM by insurance provider for evaluation of care coordination needs; patient was referred to [place] Management by [facility] telephonic RN CM on 07/02/17, as patient requested that she be visited by [place] RN. Patient has history including, but not limited to, essential HTN, HLD, arthritis, previous pulmonary embolism/ DVT, and morbid obesity.  HIPAA/ identity verified with patient during phone call today.  Today, patient reports that she is "doing great," and she denies concerns, issues, problems, or pain outside of her baseline.  Patient sounds to be in no obvious or apparent distress throughout entirety of today's phone call.  Patient further reports:  -- has and is taking medications as prescribed; denies changes to or concerns/ questions about medications -- has scheduled gynecology appointment today, this afternoon, for follow up on her endometriosis pain, at [facility]; reports son to transport patient to appointment -- attended recent evaluation for outpatient PT; states sessions are to start tomorrow; patient states she is "excited" to begin PT, although "not sure what to expect."  Discussed value of PT for fall prevention/ strengthening/ balance; patient verbalizes commitment to attending and fully participate in PT.  States she will transport herself to PT sessions -- denies pain outside of her "normal," again reports pain medication "works pretty good to ease" her chronic knee pain. -- denies new falls; continues using rolling walker "all the time."  Able to verbalize strategies for fall prevention; states hoping upcoming PT sessions "help" with improving her overall mobility.  Patient denies further issues, concerns, or problems today,  stating that she needs to get off phone to prepare for her afternoon gynecology provider appointment.  I confirmed that patient has my direct phone number, the main [facility] CM office phone number, and the [facility] CM 24-hour nurse advice phone number should issues arise prior to next scheduled [place] outreach with phone call next week after PT sessions well in progress.  Plan:  Patient will take medications as prescribed and will attend all scheduled provider appointments  Patient will promptly notify her medical providers for any new concerns/ issues or problems that arise  Patient will continue using assistive devices for mobility as needed in an effort of fall prevention  Patient will continue considering becoming re-involved with [employer] and will consider possibility of wellness benefit from insurance provider  [place] CM involvement to continue with scheduledtelephone outreach next week.  THN CM Care Plan Problem One     Most Recent Value  Care Plan Problem One  High fall risk related to history of previous falls and knee pain  Role Documenting the Problem One  Care Management [place] for Problem One  Active  THN Long Term Goal   Over the next 60 days, patient will be able to verbalize 3 strategies for fall prevention, as evidenced by patient reporting during [place] outreach  [place] Term Goal Start Date  08/31/17  Interventions for Problem One Long Term Goal  Discussed with patient newly ordered outpatient PT sessions, and confirmed that patient remains committed to attending and participating in OP PT,  confirmed that patient has experienced no new falls since last [facility] CCM outreach, and confirmed that patient is able to verbalize strategies for fall prevention   THN CM Short Term Goal #1  Over the next 30 days, Patient will attend and actively participate in outpatient PT sessions, as evidenced by patient reporting during [place] outreach  [facility]  CM Short Term Goal #1 Start Date  10/12/17  Interventions for Short Term Goal #1  Discussed value of active participation in OP PT sessions and discussed upcoming PT schedule with patient     Oneta Rack, RN, BSN, [place] Coordinator [facility] Care Management  [phone]

## 2017-10-13 ENCOUNTER — Ambulatory Visit: Payer: PPO

## 2017-10-13 DIAGNOSIS — R2689 Other abnormalities of gait and mobility: Secondary | ICD-10-CM

## 2017-10-13 DIAGNOSIS — R2681 Unsteadiness on feet: Secondary | ICD-10-CM

## 2017-10-13 DIAGNOSIS — M6281 Muscle weakness (generalized): Secondary | ICD-10-CM

## 2017-10-13 NOTE — Therapy (Addendum)
[facility] [address] [place], [place], [address] Phone: [phone]   Fax:  [phone]  Physical Therapy Treatment  Patient Details  Name: Lauren Santana MRN: [id number] Date of Birth: [birth year] Referring Provider: Dr. Criss Rosales   Encounter Date: 10/13/2017  PT End of Session - 10/13/17 1157    Visit Number  2    Number of Visits  9    Date for PT Re-Evaluation  11/05/17    Authorization Type  Health team Medicare? G-code and PN every 10th visit.     PT Start Time  1101    PT Stop Time  1142    PT Time Calculation (min)  41 min    Equipment Utilized During Treatment  Gait belt    Activity Tolerance  Patient limited by pain    Behavior During Therapy  WFL for tasks assessed/performed       Past Medical History:  Diagnosis Date  . Arthritis   . Edema    B/LLE  . High cholesterol   . Hypertension   . Numbness    B/LLE  . Wears glasses     Past Surgical History:  Procedure Laterality Date  . ABDOMINAL HYSTERECTOMY     and BSO  . [place]  . COLONOSCOPY    . HERNIA REPAIR  2001   incisional  . MULTIPLE TOOTH EXTRACTIONS  2011  . TOTAL KNEE ARTHROPLASTY Left 07/17/2014   Procedure: TOTAL KNEE ARTHROPLASTY;  Surgeon: Kerin Salen, MD;  Location: [facility];  Service: Orthopedics;  Laterality: Left;    There were no vitals filed for this visit.  Subjective Assessment - 10/13/17 1104    Subjective  Pt denied falls since last visit. Pt had an appt. at [facility] for endometriosis yesterday, and she has an [place] in 11/2017.     Pertinent History  HTN, Hx of L TKA, hx of RLE DVT/PE, dyslipidemia, B cataracts, B knee arthritis (waiting for R TKA), endometriosis, wears glasses    Patient Stated Goals  "I want to be able to feel confident taking my time when walking slow." "I don't want to take baby steps any more." She would like to get back to exercising.     Currently in Pain?  No/denies                       [facility] Adult PT Treatment/Exercise - 10/13/17 1105      Ambulation/Gait   Ambulation/Gait  Yes    Ambulation/Gait Assistance  5: Supervision;4: Min guard    Ambulation/Gait Assistance Details  Pt amb. with S during amb. with rollator, min guard during amb. with SPC and no AD. Cues to improve upright posture (trunk flexed in stance and amb), cues to improve R knee flexion in swing-limited 2/2 pain per pt. Pt does not like to rate pain (" I always live with it, it's mild right now."). Pt required seated rest breaks after amb. 2/2 knee pain and fatigue.     Ambulation Distance (Feet)  75 Feet 230'    Assistive device  Rollator;Straight cane;None    Gait Pattern  Step-through pattern;Decreased stride length;Trunk flexed;Decreased hip/knee flexion - right;Decreased weight shift to right    Ambulation Surface  Level;Indoor      Standardized Balance Assessment   Standardized Balance Assessment  Berg Balance Test      Berg Balance Test   Sit to Stand  Able to stand  independently using hands    Standing Unsupported  Able to stand 2 minutes with supervision    Sitting with Back Unsupported but Feet Supported on Floor or Stool  Able to sit 2 minutes under supervision    Stand to Sit  Controls descent by using hands    Transfers  Able to transfer safely, definite need of hands    Standing Unsupported with Eyes Closed  Able to stand 10 seconds safely    Standing Ubsupported with Feet Together  Able to place feet together independently and stand for 1 minute with supervision    From Standing, Reach Forward with Outstretched Arm  Can reach forward >12 cm safely (5")    From Standing Position, Pick up Object from [place] to pick up shoe safely and easily    From Standing Position, Turn to Look Behind Over each Shoulder  Turn sideways only but maintains balance    Turn 360 Degrees  Able to turn 360 degrees safely but slowly    Standing Unsupported, Alternately Place  Feet on Step/Stool  Needs assistance to keep from falling or unable to try pt reported B knee pain and needed seated rest break    Standing Unsupported, One Foot in Front  Able to take small step independently and hold 30 seconds    Standing on One Leg  Unable to try or needs assist to prevent fall    Total Score  35      Pt required extensive seated rest breaks during BERG 2/2 knee pain and fatigue.       Self Care: PT Education - 10/13/17 1155    Education provided  Yes    Education Details  PT discussed BERG and falls risk. PT provided pt with home walking program for endurance (must use rollator for walking). PT discussed the importance of getting new rollator (she has a prescription from [facility] but is waiting) as her brakes don't work well. Pt asked if we massaged legs, and PT explained that we'll provide exercises to decr. BLE edema and that ice for knee (10-15 minutes) after activity is helpful to reduce pain.     Person(s) Educated  Patient    Methods  Explanation;Verbal cues;Handout    Comprehension  Returned demonstration;Verbalized understanding       PT Short Term Goals - 10/06/17 1254      PT SHORT TERM GOAL #1   Title  same as LTGs        PT Long Term Goals - 10/13/17 1200      PT LONG TERM GOAL #1   Title  Pt will be IND in HEP to improve strength, endurance, and balance. TARGET DATE FOR ALL LTGS: 11/03/17    Status  New      PT LONG TERM GOAL #2   Title  Perform BERG and write goal as indicated.     Status  Achieved      PT LONG TERM GOAL #3   Title  Pt will perform TUG with LRAD in </=13.5 sec. to decr. falls risk.     Status  New      PT LONG TERM GOAL #4   Title  Pt will improve gait speed to >/=2.14ft/sec. with LRAD , to safely amb. in the community.     Status  New      PT LONG TERM GOAL #5   Title  Pt will amb. 600' over even/uneven terrain with LRAD, at MOD I level, to improve  functional mobility.     Status  New      Additional Long Term Goals    Additional Long Term Goals  Yes      PT LONG TERM GOAL #6   Title  Pt will improve BERG score to >/=43/56 to decr. falls risk.     Status  New            Plan - 10/13/17 1158    Clinical Impression Statement  Pt's BERG score indicates pt is at high risk for falls. Pt demonstrated less gait deviations during amb. with rollator vs. SPC vs. no AD. PT educated pt on the importance of using rollator at all times and to only practice with [facility] in the kitchen next to counter for support prn. PT provided pt with walking HEP to improve endurance. Continue with POC.     Rehab Potential  Good    Clinical Impairments Affecting Rehab Potential  see above    PT Frequency  2x / week    PT Duration  4 weeks pt limited by co-pay    PT Treatment/Interventions  ADLs/Self Care Home Management;Biofeedback;Electrical Stimulation;Therapeutic exercise;Manual techniques;Therapeutic activities;Moist Heat;Functional mobility training;Orthotic Fit/Training;Stair training;Gait training;Patient/family education;DME Instruction;Neuromuscular re-education;Balance training;Vestibular    PT Next Visit Plan  Initiate strengthening and balance HEP. Gait with rollator and SPC.        Patient will benefit from skilled therapeutic intervention in order to improve the following deficits and impairments:  Abnormal gait, Decreased endurance, Decreased strength, Pain, Impaired flexibility, Postural dysfunction, Decreased safety awareness, Decreased balance, Decreased mobility, Impaired sensation  Visit Diagnosis: Unsteadiness on feet  Other abnormalities of gait and mobility  Muscle weakness (generalized)     Problem List Patient Active Problem List   Diagnosis Date Noted  . Tachycardia   . Acute deep vein thrombosis (DVT) of right lower extremity ([place]) 02/14/2016  . Pulmonary emboli ([place]) 02/13/2016  . PE (pulmonary embolism) 02/13/2016  . AKI (acute kidney injury) ([place]) 02/13/2016  . Pulmonary embolism with  acute cor pulmonale ([place])   . Pelvic mass in female   . Morbid obesity with BMI of 40.0-44.9, adult ([place]) 08/20/2015  . 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 Essential hypertension, benign 07/23/2014  . Edema 07/23/2014  . Dyslipidemia 07/23/2014  . Lower extremity numbness 07/23/2014  . 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 Postmenopausal bleeding 03/10/2012  . Endometriosis of pelvis 01/22/2007    Jhaniya Briski L 10/13/2017, 12:02 PM  [facility] [address] [place], [place], [address] Phone: [phone]   Fax:  [phone]  Name: Lauren Santana MRN: [id number] Date of Birth: 1951-08-28  Geoffry Paradise, PT,DPT 10/13/17 12:03 PM Phone: [phone] Fax: [phone]

## 2017-10-13 NOTE — Patient Instructions (Signed)
Walking Program:  Begin walking for exercise for 2.5 minutes, 2 times/day, 5 days/week.   Progress your walking program by adding 1 minutes to your routine each week, as tolerated. Be sure to wear good walking shoes, walk in a safe environment and only progress to your tolerance.

## 2017-10-19 DIAGNOSIS — I10 Essential (primary) hypertension: Secondary | ICD-10-CM | POA: Diagnosis not present

## 2017-10-19 DIAGNOSIS — F6089 Other specific personality disorders: Secondary | ICD-10-CM | POA: Diagnosis not present

## 2017-10-19 DIAGNOSIS — E669 Obesity, unspecified: Secondary | ICD-10-CM | POA: Diagnosis not present

## 2017-10-20 ENCOUNTER — Ambulatory Visit: Payer: PPO

## 2017-10-20 DIAGNOSIS — R2689 Other abnormalities of gait and mobility: Secondary | ICD-10-CM

## 2017-10-20 DIAGNOSIS — M6281 Muscle weakness (generalized): Secondary | ICD-10-CM

## 2017-10-20 DIAGNOSIS — R2681 Unsteadiness on feet: Secondary | ICD-10-CM

## 2017-10-20 NOTE — Patient Instructions (Signed)
ANKLE: Eversion, Bilateral (Band)    Don't tie band around feet. Keeping heels on floor, raise toes of both feet up and away from body. Do not move hips or knees. Hold _2__ seconds.  _10__ reps per set, __2-3_ sets per day, _3__ days per week  Copyright  VHI. All rights reserved.

## 2017-10-20 NOTE — Therapy (Signed)
[facility] [address] [place], [place], [address] Phone: [phone]   Fax:  [phone]  Physical Therapy Treatment  Patient Details  Name: Lauren Santana MRN: [id number] Date of Birth: [birth year] Referring Provider: Dr. Criss Rosales   Encounter Date: 10/20/2017  PT End of Session - 10/20/17 1103    Visit Number  3    Number of Visits  9    Date for PT Re-Evaluation  11/05/17    Authorization Type  Health team Medicare? G-code and PN every 10th visit.     PT Start Time  1016    PT Stop Time  1058    PT Time Calculation (min)  42 min    Equipment Utilized During Treatment  -- min guard to S prn    Activity Tolerance  Patient tolerated treatment well;Patient limited by fatigue    Behavior During Therapy  WFL for tasks assessed/performed       Past Medical History:  Diagnosis Date  . Arthritis   . Edema    B/LLE  . High cholesterol   . Hypertension   . Numbness    B/LLE  . Wears glasses     Past Surgical History:  Procedure Laterality Date  . ABDOMINAL HYSTERECTOMY     and BSO  . [place]  . COLONOSCOPY    . HERNIA REPAIR  2001   incisional  . MULTIPLE TOOTH EXTRACTIONS  2011  . TOTAL KNEE ARTHROPLASTY Left 07/17/2014   Procedure: TOTAL KNEE ARTHROPLASTY;  Surgeon: Kerin Salen, MD;  Location: [facility];  Service: Orthopedics;  Laterality: Left;    There were no vitals filed for this visit.  Subjective Assessment - 10/20/17 1018    Subjective  Pt denied falls or changes since last visit. Pt has pain but wishes not to rate "I always have pain."    Pertinent History  HTN, Hx of L TKA, hx of RLE DVT/PE, dyslipidemia, B cataracts, B knee arthritis (waiting for R TKA), endometriosis, wears glasses    Patient Stated Goals  "I want to be able to feel confident taking my time when walking slow." "I don't want to take baby steps any more." She would like to get back to exercising.     Currently in Pain?  Yes                           Balance Exercises - 10/20/17 1020      OTAGO PROGRAM   Head Movements  Sitting;5 reps    Neck Movements  5 reps;Sitting    Back Extension  Standing;5 reps    Trunk Movements  Standing;5 reps    Ankle Movements  10 reps;Sitting    Knee Extensor  20 reps    Knee Flexor  5 reps;10 reps RLE seated, LLE standing    Hip ABductor  20 reps 10 reps seated and 10 reps standing    Ankle Plantorflexors  20 reps, support    Ankle Dorsiflexors  20 reps, support    Overall OTAGO Comments  HR: 75-77bpm after hip abd. and SaO2 on room air: 96%. Pt also performed seated B ankle eversion to decr. R ankle inversion. Pt required frequent seated rest breaks 2/2 B knee pain and fatigue. Performed with S and with UE support for safety.        PT Education - 10/20/17 1059    Education provided  Yes  Education Details  PT discussed OTAGO benefits and initiated HEP.    Person(s) Educated  Patient    Methods  Explanation;Demonstration;Tactile cues;Verbal cues;Handout    Comprehension  Returned demonstration;Verbalized understanding       PT Short Term Goals - 10/06/17 1254      PT SHORT TERM GOAL #1   Title  same as LTGs        PT Long Term Goals - 10/13/17 1200      PT LONG TERM GOAL #1   Title  Pt will be IND in HEP to improve strength, endurance, and balance. TARGET DATE FOR ALL LTGS: 11/03/17    Status  New      PT LONG TERM GOAL #2   Title  Perform BERG and write goal as indicated.     Status  Achieved      PT LONG TERM GOAL #3   Title  Pt will perform TUG with LRAD in </=13.5 sec. to decr. falls risk.     Status  New      PT LONG TERM GOAL #4   Title  Pt will improve gait speed to >/=2.61ft/sec. with LRAD , to safely amb. in the community.     Status  New      PT LONG TERM GOAL #5   Title  Pt will amb. 600' over even/uneven terrain with LRAD, at MOD I level, to improve functional mobility.     Status  New       Additional Long Term Goals   Additional Long Term Goals  Yes      PT LONG TERM GOAL #6   Title  Pt will improve BERG score to >/=43/56 to decr. falls risk.     Status  New            Plan - 10/20/17 1107    Clinical Impression Statement  Today's skilled session focused on initiating OTAGO HEP for improvement in pt's balance, strength, posture, and endurance. Pt required frequent seated rest breaks 2/2 B knee pain and fatigue. Pt would continue to benefit from skilled PT to improve safety during functional mobility.     Rehab Potential  Good    Clinical Impairments Affecting Rehab Potential  see above    PT Frequency  2x / week    PT Duration  4 weeks pt limited by co-pay    PT Treatment/Interventions  ADLs/Self Care Home Management;Biofeedback;Electrical Stimulation;Therapeutic exercise;Manual techniques;Therapeutic activities;Moist Heat;Functional mobility training;Orthotic Fit/Training;Stair training;Gait training;Patient/family education;DME Instruction;Neuromuscular re-education;Balance training;Vestibular    PT Next Visit Plan  Finish initiating [place]. Gait with rollator and SPC.        Patient will benefit from skilled therapeutic intervention in order to improve the following deficits and impairments:  Abnormal gait, Decreased endurance, Decreased strength, Pain, Impaired flexibility, Postural dysfunction, Decreased safety awareness, Decreased balance, Decreased mobility, Impaired sensation  Visit Diagnosis: Other abnormalities of gait and mobility  Muscle weakness (generalized)  Unsteadiness on feet     Problem List Patient Active Problem List   Diagnosis Date Noted  . Tachycardia   . Acute deep vein thrombosis (DVT) of right lower extremity ([place]) 02/14/2016  . Pulmonary emboli ([place]) 02/13/2016  . PE (pulmonary embolism) 02/13/2016  . AKI (acute kidney injury) ([place]) 02/13/2016  . Pulmonary embolism with acute cor pulmonale ([place])   . Pelvic mass in female   .  Morbid obesity with BMI of 40.0-44.9, adult ([place]) 08/20/2015  . 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 Essential  hypertension, benign 07/23/2014  . Edema 07/23/2014  . Dyslipidemia 07/23/2014  . Lower extremity numbness 07/23/2014  . 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 Postmenopausal bleeding 03/10/2012  . Endometriosis of pelvis 01/22/2007    Philomene Haff L 10/20/2017, 11:14 AM  [facility] [address] [place], [place], [address] Phone: [phone]   Fax:  [phone]  Name: Lauren Santana MRN: [id number] Date of Birth: 12-Nov-1951  Geoffry Paradise, PT,DPT 10/20/17 11:16 AM Phone: [phone] Fax: [phone]

## 2017-10-21 ENCOUNTER — Encounter: Payer: Self-pay | Admitting: *Deleted

## 2017-10-21 ENCOUNTER — Other Ambulatory Visit: Payer: Self-pay | Admitting: *Deleted

## 2017-10-21 NOTE — Patient Outreach (Signed)
[facility]) Care Management [place] Telephone Outreach  10/21/2017  [place] 30-Dec-1950 [id number]  Unsuccessful telephone outreach to Lauren Santana,65 y.o.femaleoriginally referred to [facility] CM by insurance provider for evaluation of care coordination needs; patient was referred to [place] Management by [facility] telephonic RN CM on 07/02/17, as patient requested that she be visited by [place] RN. Patient has history including, but not limited to, essential HTN, HLD, arthritis, previous pulmonary embolism/ DVT, and morbid obesity.    HIPAA compliant voice mail message left for patient, requesting return call back.  Plan:  Will re-attempt [place] telephone outreach later this week if I do not hear back from patient first.  Oneta Rack, RN, BSN, [place] Coordinator [facility] Care Management  [phone]

## 2017-10-22 ENCOUNTER — Ambulatory Visit: Payer: PPO

## 2017-10-23 ENCOUNTER — Other Ambulatory Visit: Payer: Self-pay | Admitting: *Deleted

## 2017-10-23 ENCOUNTER — Encounter: Payer: Self-pay | Admitting: *Deleted

## 2017-10-23 NOTE — Patient Outreach (Signed)
[facility]) Care Management [place] Telephone Outreach  10/23/2017  [place] 03/10/1951 [id number]  Unsuccessful telephone outreach to Elizabeth Sauer Condon,65 y.o.femaleoriginally referred to [facility] CM by insurance provider for evaluation of care coordination needs; patient was referred to [place] Management by [facility] telephonic RN CM on 07/02/17, as patient requested that she be visited by [place] RN. Patient has history including, but not limited to, essential HTN, HLD, arthritis, previous pulmonary embolism/ DVT, and morbid obesity.    HIPAA compliant voice mail message left for patient, requesting return call back.  Plan:  Will re-attempt [place] telephone outreach again next week if I do not hear back from patient first.  Oneta Rack, RN, BSN, [place] Coordinator [facility] Care Management  [phone]

## 2017-10-23 NOTE — Patient Outreach (Signed)
[name]) Care Management [place] Telephone Outreach  10/23/2017  [place] 1951-10-25 [id number]  Successful telephone outreach to Lauren Santana,66 y.o.femaleoriginally referred to [facility] CM by insurance provider for evaluation of care coordination needs; patient was referred to [place] Management by [facility] telephonic RN CM on 07/02/17, as patient requested that she be visited by [place] RN. Patient has history including, but not limited to, essential HTN, HLD, arthritis, previous pulmonary embolism/ DVT, and morbid obesity.  HIPAA/ identity verified with patient during phone call today.  Patient returned my call from earlier today, but did not leave voice mail message; recognized patient's phone number and returned her call.  Today, patient reports that she is "doing great," and she denies concerns, issues, problems, or pain outside of her baseline.  Patient states she "is out with friends" right now and can not talk long, but stated that she wanted to return my call.  Patient sounds to be in no obvious or apparent distress throughout entirety of today's phone call.  Patient further reports:  -- attended recent gynecology provider appointment at [facility]; reports has another follow up appointment scheduled for early January 2019.  These visits are for follow up on her endometriosis pain.  Patient reports she cannot tell me any details about visit now, but will update me later when she has more time. -- attended recent outpatient PT sessions; states sessions are "going well," and that she believes she will benefit from OP PT.  Discussed value of PT for fall prevention/ strengthening/ balance and encouraged patient to continue fully participating in PT sessions; patient verbalizes commitment to attending and fully participate in PT.  States she will transport herself to PT sessions -- denies new falls; continues using rolling  walker "all the time."  Able to verbalize strategies for fall prevention; states hoping upcoming PT sessions "help" with improving her overall mobility.  Patient denies further issues, concerns, or problems today, stating that she needs to get off phone. I confirmed that patient hasmy direct phone number, the main [facility] CM office phone number, and the [facility] CM 24-hour nurse advice phone number should issues arise prior to next scheduled [place] outreach with phone call next week.  Plan:  Patient will take medications as prescribed and will attend all scheduled provider appointments  Patient will promptly notify her medical providers for any new concerns/ issues or problems that arise  Patient will continue using assistive devices for mobility as needed in an effort of fall prevention  Patient willcontinueconsideringbecoming re-involved with [employer] and will consider possibility of wellness benefit from insurance provider  [place] CM involvement to continue with scheduledtelephone outreach next week.  [facility] CM Care Plan Problem One     Most Recent Value  Care Plan Problem One  High fall risk related to history of previous falls and knee pain  Role Documenting the Problem One  Care Management [place] for Problem One  Active  THN Long Term Goal   Over the next 60 days, patient will be able to verbalize 3 strategies for fall prevention, as evidenced by patient reporting during [place] outreach  [place] Term Goal Start Date  08/31/17  Interventions for Problem One Long Term Goal  Discussed with patient outpatient PT sessions currently in progress, and confirmed that patient remains committed to attending and participating in OP PT,  confirmed that patient has experienced no new falls  since last [place] outreach, and continues using her walker for falll prevention  THN CM Short Term Goal #1   Patient will attend and actively participate in outpatient  PT sessions, as evidenced by patient reporting during [place] outreach  [facility] CM Short Term Goal #1 Start Date  10/12/17  Interventions for Short Term Goal #1  Discussed patient's current perception of PT sessions thus far,  reiterated value of active participation in OP PT sessions and discussed upcoming PT schedule with patient     Oneta Rack, RN, BSN, [place] Coordinator [facility] Care Management  [phone]

## 2017-10-29 ENCOUNTER — Encounter: Payer: Self-pay | Admitting: *Deleted

## 2017-10-29 ENCOUNTER — Other Ambulatory Visit: Payer: Self-pay | Admitting: *Deleted

## 2017-10-29 NOTE — Patient Outreach (Signed)
[place] [facility]) Care Management [place] Telephone Outreach  10/29/2017  [place] [facility] 12/30/1950 [id number]  10:15 am:  Unsuccessful telephone outreach toLinda F Loftus,65 y.o.femaleoriginally referred to [facility] CM by insurance provider for evaluation of care coordination needs; patient was referred to [place] Management by [facility] telephonic RN CM on 07/02/17, as patient requested that she be visited by [place] RN. Patient has history including, but not limited to, essential HTN, HLD, arthritis, previous pulmonary embolism/ DVT, and morbid obesity.  HIPAA compliant voice mail message left for patient, requesting return call back.  Plan:  Will re-attempt [place] telephone outreach later this week if I do not hear back from patient first.  Oneta Rack, RN, BSN, [place] Coordinator [facility] Care Management  [phone]

## 2017-10-30 ENCOUNTER — Encounter: Payer: Self-pay | Admitting: *Deleted

## 2017-10-30 ENCOUNTER — Other Ambulatory Visit: Payer: Self-pay | Admitting: *Deleted

## 2017-10-30 ENCOUNTER — Ambulatory Visit: Payer: PPO | Attending: Family Medicine

## 2017-10-30 DIAGNOSIS — R2689 Other abnormalities of gait and mobility: Secondary | ICD-10-CM | POA: Diagnosis not present

## 2017-10-30 DIAGNOSIS — G8929 Other chronic pain: Secondary | ICD-10-CM | POA: Diagnosis not present

## 2017-10-30 DIAGNOSIS — M25561 Pain in right knee: Secondary | ICD-10-CM | POA: Diagnosis not present

## 2017-10-30 DIAGNOSIS — R2681 Unsteadiness on feet: Secondary | ICD-10-CM | POA: Diagnosis not present

## 2017-10-30 DIAGNOSIS — M6281 Muscle weakness (generalized): Secondary | ICD-10-CM | POA: Diagnosis not present

## 2017-10-30 NOTE — Patient Outreach (Signed)
[facility]) Care Management [place] Telephone Outreach  10/30/2017  [name] [facility] 1951/02/23 [id number]  1:30 pm: Unsuccessful telephone outreach toLinda F Santana,65 y.o.femaleoriginally referred to [facility] CM by insurance provider for evaluation of care coordination needs; patient was referred to [place] Management by [facility] telephonic RN CM on 07/02/17, as patient requested that she be visited by [place] RN. Patient has history including, but not limited to, essential HTN, HLD, arthritis, previous pulmonary embolism/ DVT, and morbid obesity.   HIPAA compliant voice mail message left for patient, requesting return call back.  Plan:  Will re-attempt [place] telephone outreach again next week if I do not hear back from patient first.  Oneta Rack, RN, BSN, [place] Coordinator [facility] Care Management  [phone]

## 2017-10-30 NOTE — Therapy (Signed)
[facility] [address] [place], [place], [address] Phone: [phone]   Fax:  [phone]  Physical Therapy Treatment  Patient Details  Name: Lauren Santana MRN: [id number] Date of Birth: 1951/02/25 Referring Provider: Dr. Criss Rosales   Encounter Date: 10/30/2017  PT End of Session - 10/30/17 1056    Visit Number  4    Number of Visits  9    Date for PT Re-Evaluation  11/05/17    Authorization Type  Health team Medicare? G-code and PN every 10th visit.     PT Start Time  1016    PT Stop Time  1056    PT Time Calculation (min)  40 min    Equipment Utilized During Treatment  -- min guard to S prn    Activity Tolerance  Patient tolerated treatment well    Behavior During Therapy  WFL for tasks assessed/performed       Past Medical History:  Diagnosis Date  . Arthritis   . Edema    B/LLE  . High cholesterol   . Hypertension   . Numbness    B/LLE  . Wears glasses     Past Surgical History:  Procedure Laterality Date  . ABDOMINAL HYSTERECTOMY     and BSO  . [place]  . COLONOSCOPY    . HERNIA REPAIR  2001   incisional  . MULTIPLE TOOTH EXTRACTIONS  2011  . TOTAL KNEE ARTHROPLASTY Left 07/17/2014   Procedure: TOTAL KNEE ARTHROPLASTY;  Surgeon: Kerin Salen, MD;  Location: [place];  Service: Orthopedics;  Laterality: Left;    There were no vitals filed for this visit.  Subjective Assessment - 10/30/17 1018    Subjective  Pt denied falls or changes since last visit.     Pertinent History  HTN, Hx of L TKA, hx of RLE DVT/PE, dyslipidemia, B cataracts, B knee arthritis (waiting for R TKA), endometriosis, wears glasses    Patient Stated Goals  "I want to be able to feel confident taking my time when walking slow." "I don't want to take baby steps any more." She would like to get back to exercising.     Currently in Pain?  Yes pt unable to rate pain                            Balance Exercises - 10/30/17 1020      OTAGO PROGRAM   Head Movements  Sitting 3 reps    Neck Movements  Sitting;5 reps with pillow behind head    Back Extension  Standing;5 reps    Trunk Movements  Standing;5 reps    Ankle Movements  Sitting;10 reps    Knee Extensor  10 reps    Knee Flexor  10 reps x10 R seated and x10 L standing    Hip ABductor  10 reps    Ankle Plantorflexors  20 reps, support    Ankle Dorsiflexors  20 reps, support    Knee Bends  10 reps, support    Backwards Walking  Support    Walking and Turning Around  -- n/a    Sideways Walking  Assistive device    Tandem Stance  10 seconds, support modified    Tandem Walk  -- n/a    One Leg Stand  10 seconds, support    Heel Walking  -- n/a    Toe Walk  -- n/a  Heel Toe Walking Backward  -- n/a    Sit to Stand  10 reps, bilateral support    Overall OTAGO Comments  HR: 72bpm and SaO2 on room air: 97%. Cues for technique and frequent rest breaks 2/2 fatigue and R knee pain. PT also reviewed seated B ankle eversion x10 reps.         PT Education - 10/30/17 1056    Education provided  Yes    Education Details  PT finished initiation of OTAGO HEP and reviewed previous [place] activities, as pt reported she forgot some of them.     Person(s) Educated  Patient    Methods  Explanation;Demonstration;Verbal cues;Handout    Comprehension  Returned demonstration;Verbalized understanding       PT Short Term Goals - 10/06/17 1254      PT SHORT TERM GOAL #1   Title  same as LTGs        PT Long Term Goals - 10/13/17 1200      PT LONG TERM GOAL #1   Title  Pt will be IND in HEP to improve strength, endurance, and balance. TARGET DATE FOR ALL LTGS: 11/03/17    Status  New      PT LONG TERM GOAL #2   Title  Perform BERG and write goal as indicated.     Status  Achieved      PT LONG TERM GOAL #3   Title  Pt will perform TUG with LRAD in </=13.5 sec. to decr. falls risk.      Status  New      PT LONG TERM GOAL #4   Title  Pt will improve gait speed to >/=2.31ft/sec. with LRAD , to safely amb. in the community.     Status  New      PT LONG TERM GOAL #5   Title  Pt will amb. 600' over even/uneven terrain with LRAD, at MOD I level, to improve functional mobility.     Status  New      Additional Long Term Goals   Additional Long Term Goals  Yes      PT LONG TERM GOAL #6   Title  Pt will improve BERG score to >/=43/56 to decr. falls risk.     Status  New            Plan - 10/30/17 1057    Clinical Impression Statement  Pt tolerated OTAGO HEP well and denied incr. in pain during session. However, pt continues to require frequent seated and standing rest breaks 2/2 fatigue and R knee pain. PT removed activities performed without UE support, as pt required support for safety. Continue with POC.     Rehab Potential  Good    Clinical Impairments Affecting Rehab Potential  see above    PT Frequency  2x / week    PT Duration  4 weeks pt limited by co-pay    PT Treatment/Interventions  ADLs/Self Care Home Management;Biofeedback;Electrical Stimulation;Therapeutic exercise;Manual techniques;Therapeutic activities;Moist Heat;Functional mobility training;Orthotic Fit/Training;Stair training;Gait training;Patient/family education;DME Instruction;Neuromuscular re-education;Balance training;Vestibular    PT Next Visit Plan  Check goals and likely renew. Gait with rollator and SPC.        Patient will benefit from skilled therapeutic intervention in order to improve the following deficits and impairments:  Abnormal gait, Decreased endurance, Decreased strength, Pain, Impaired flexibility, Postural dysfunction, Decreased safety awareness, Decreased balance, Decreased mobility, Impaired sensation  Visit Diagnosis: Other abnormalities of gait and mobility  Muscle weakness (generalized)  Unsteadiness  on feet     Problem List Patient Active Problem List   Diagnosis  Date Noted  . Tachycardia   . Acute deep vein thrombosis (DVT) of right lower extremity ([place]) 02/14/2016  . Pulmonary emboli ([place]) 02/13/2016  . PE (pulmonary embolism) 02/13/2016  . AKI (acute kidney injury) ([place]) 02/13/2016  . Pulmonary embolism with acute cor pulmonale ([place])   . Pelvic mass in female   . Morbid obesity with BMI of 40.0-44.9, adult ([place]) 08/20/2015  . 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 Essential hypertension, benign 07/23/2014  . Edema 07/23/2014  . Dyslipidemia 07/23/2014  . Lower extremity numbness 07/23/2014  . 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 Postmenopausal bleeding 03/10/2012  . Endometriosis of pelvis 01/22/2007    Miller,Jennifer L 10/30/2017, 10:59 AM  [facility] [address] [place], [place], [address] Phone: [phone]   Fax:  [phone]  Name: Lauren Santana MRN: [id number] Date of Birth: 28-Jul-1951  Geoffry Paradise, PT,DPT 10/30/17 10:59 AM Phone: [phone] Fax: [phone]

## 2017-11-02 ENCOUNTER — Other Ambulatory Visit: Payer: Self-pay | Admitting: *Deleted

## 2017-11-02 ENCOUNTER — Encounter: Payer: Self-pay | Admitting: *Deleted

## 2017-11-02 NOTE — Patient Outreach (Addendum)
Lauren Santana [facility]) Care Management [place] Telephone Outreach/ Case Closure  11/02/2017  Lauren Santana [facility] 12-Feb-1951 [id number]  Successful telephone outreach toLinda F Santana,65 y.o.femaleoriginally referred to [facility] CM by insurance provider for evaluation of care coordination needs; patient was referred to [place] Management by [facility] telephonic RN CM on 07/02/17, as patient requested that she be visited by [place] RN. Patient has history including, but not limited to, essential HTN, HLD, arthritis, previous pulmonary embolism/ DVT, and morbid obesity. HIPAA/ identity verified with patient during phone call today.  Today, patient reports that she is "doing just fine," and she denies concerns, issues, problems, or pain outside of her baseline; patient declines placing numerical value on her pain levels, and states several times that her pain "hasn't changed and is the same as it always is." Patient states she is "doing okay" in the snowy weather and "staying in."  Patient sounds to be in no obvious or apparent distress throughout entirety of today's phone call.  Patient further reports:  -- attending regular sessions at outpatient PT sessions; states sessions are "going well," and that she believes she is already benefiting from OP PT.  States she enjoys the OP PT sessions and likes the staff.  Reiterated value of PT for fall prevention/ strengthening/ balance and encouraged patient to continue fully participating in PT sessions; patient verbalizes commitment to attending and fully participate in PT. States she will continue to transport herself to PT sessions -- denies new falls; continues using rolling walker "all the time." Able to verbalize strategies for fall prevention; again states that she is hoping upcoming PT sessions "help" with improving her overall mobility, stating that she can "already see a difference."  Patient denies further  issues, concerns, or problems today.  We discussed that patient has thus far met her previously established [facility] CM goals, and patient agrees with [facility] CM case closure.  I confirmed that patient hasmy direct phone number, the main [facility] CM office phone number, and the [facility] CM 24-hour nurse advice phone number should issues arise in the future.  Plan:  Will close Lauren Santana patient case and make patient's PCP and [facility] CMA aware of same  [facility] CM Care Plan Problem One     Most Recent Value  Care Plan Problem One  High fall risk related to history of previous falls and knee pain  Role Documenting the Problem One  Care Management Coordinator  Care Plan for Problem One  Not Active  [facility] Long Term Goal   Over the next 60 days, patient will be able to verbalize 3 strategies for fall prevention, as evidenced by patient reporting during [place] outreach  [place] Term Goal Start Date  08/31/17  [facility] Long Term Goal Met Date  11/02/17  Interventions for Problem One Long Term Goal  Reiterated with patient fall prevention starategies and discussed personal action plan for fall prevention,  confirmed that patient has had no new falls, and discussed with patient that she has thus far met her previously established [facility] CM goals,  case closure discussed with patient and agreed upon  [facility] CM Short Term Goal #1   Patient will attend and actively participate in outpatient PT sessions, as evidenced by patient reporting during [place] outreach  [facility] CM Short Term Goal #1 Start Date  10/12/17  [facility] CM Short Term Goal #1 Met Date  11/02/17  Interventions for Short Term Goal #1  Discussed patient's current perception of PT sessions thus far,  reiterated value of active participation in OP PT sessions and discussed upcoming PT schedule with patient     It has been a pleasure participating in [place] care,  Oneta Rack, RN, BSN, [employer] Coordinator [facility] Care Management  [phone]

## 2017-11-03 ENCOUNTER — Ambulatory Visit: Payer: PPO

## 2017-11-06 ENCOUNTER — Ambulatory Visit: Payer: PPO

## 2017-11-06 DIAGNOSIS — R2689 Other abnormalities of gait and mobility: Secondary | ICD-10-CM | POA: Diagnosis not present

## 2017-11-06 DIAGNOSIS — M6281 Muscle weakness (generalized): Secondary | ICD-10-CM

## 2017-11-06 DIAGNOSIS — R2681 Unsteadiness on feet: Secondary | ICD-10-CM

## 2017-11-06 DIAGNOSIS — G8929 Other chronic pain: Secondary | ICD-10-CM

## 2017-11-06 DIAGNOSIS — M25561 Pain in right knee: Secondary | ICD-10-CM

## 2017-11-06 NOTE — Therapy (Signed)
[facility] [address] [place], [place], [address] Phone: [phone]   Fax:  [phone]  Physical Therapy Treatment  Patient Details  Name: Lauren Santana MRN: [id number] Date of Birth: [birth year] Referring Provider: Dr. Criss Rosales   Encounter Date: 11/06/2017  PT End of Session - 11/06/17 1100    Visit Number  5    Number of Visits  13 requesting additional 2x/week for 4 weeks    Date for PT Re-Evaluation  12/05/17    Authorization Type  Health team Medicare? G-code and PN every 10th visit.     PT Start Time  1018 pt late    PT Stop Time  1057    PT Time Calculation (min)  39 min    Equipment Utilized During Treatment  Gait belt    Activity Tolerance  Patient tolerated treatment well    Behavior During Therapy  WFL for tasks assessed/performed       Past Medical History:  Diagnosis Date  . Arthritis   . Edema    B/LLE  . High cholesterol   . Hypertension   . Numbness    B/LLE  . Wears glasses     Past Surgical History:  Procedure Laterality Date  . ABDOMINAL HYSTERECTOMY     and BSO  . [place]  . COLONOSCOPY    . HERNIA REPAIR  2001   incisional  . MULTIPLE TOOTH EXTRACTIONS  2011  . TOTAL KNEE ARTHROPLASTY Left 07/17/2014   Procedure: TOTAL KNEE ARTHROPLASTY;  Surgeon: Kerin Salen, MD;  Location: [facility];  Service: Orthopedics;  Laterality: Left;    There were no vitals filed for this visit.  Subjective Assessment - 11/06/17 1021    Subjective  Pt denied falls or changes since last visit. Pt reports she has been performing HEP by memory and not following the book but will try to follow it.     Pertinent History  HTN, Hx of L TKA, hx of RLE DVT/PE, dyslipidemia, B cataracts, B knee arthritis (waiting for R TKA), endometriosis, wears glasses    Patient Stated Goals  "I want to be able to feel confident taking my time when walking slow." "I don't want to take baby steps any  more." She would like to get back to exercising.     Currently in Pain?  Yes    Pain Score  7     Pain Location  Knee    Pain Orientation  Right    Pain Descriptors / Indicators  Aching;Nagging    Pain Onset  More than a month ago    Pain Frequency  Constant    Aggravating Factors   moving certain ways    Pain Relieving Factors  rest                      [facility] Adult PT Treatment/Exercise - 11/06/17 1026      Ambulation/Gait   Ambulation/Gait  Yes    Ambulation/Gait Assistance  5: Supervision;6: Modified independent (Device/Increase time)    Ambulation/Gait Assistance Details  S during turns, otherwise MOD I level over even terrain. Pt amb. last 115' whiler performing head turns. No c/o dizziness or LOB.    Ambulation Distance (Feet)  75 Feet x3 and 115' with rollator    Assistive device  Rollator    Gait Pattern  Step-through pattern;Decreased stride length;Trunk flexed;Decreased hip/knee flexion - right;Decreased weight shift to right  Ambulation Surface  Level;Indoor    Gait velocity  2.15 ft/sec. and 2.3f/sec.    Gait Comments  Pt reported difficulty with R knee flexion during gait 2/2 R knee pain.      Standardized Balance Assessment   Standardized Balance Assessment  Berg Balance Test;Timed Up and Go Test      Berg Balance Test   Sit to Stand  Able to stand without using hands and stabilize independently    Standing Unsupported  Able to stand safely 2 minutes    Sitting with Back Unsupported but Feet Supported on Floor or Stool  Able to sit safely and securely 2 minutes    Stand to Sit  Sits safely with minimal use of hands    Transfers  Able to transfer safely, definite need of hands    Standing Unsupported with Eyes Closed  Able to stand 10 seconds safely    Standing Ubsupported with Feet Together  Able to place feet together independently and stand 1 minute safely    From Standing, Reach Forward with Outstretched Arm  Can reach forward >12 cm safely (5")  6"    From Standing Position, Pick up Object from Floor  Able to pick up shoe safely and easily    From Standing Position, Turn to Look Behind Over each Shoulder  Turn sideways only but maintains balance    Turn 360 Degrees  Able to turn 360 degrees safely but slowly    Standing Unsupported, Alternately Place Feet on Step/Stool  Able to complete >2 steps/needs minimal assist    Standing Unsupported, One Foot in Front  Able to plae foot ahead of the other independently and hold 30 seconds    Standing on One Leg  Tries to lift leg/unable to hold 3 seconds but remains standing independently LLE SLS    Total Score  43      Timed Up and Go Test   TUG  Normal TUG    Normal TUG (seconds)  14.82 and 13.15 sec. with rollator       Pt required frequent seated rest breaks 2/2 fatigue and R knee pain.      PT Education - 11/06/17 1059    Education provided  Yes    Education Details  PT discussed goal progress and outcome measure results. PT encouraged pt to attempt OTAGO this weekend and bring questions next session. PT discussed renewal process for 2x/week for 4 additional weeks to continue to make gains in PT.     Person(s) Educated  Patient    Methods  Explanation;Demonstration    Comprehension  Verbalized understanding       PT Short Term Goals - 10/06/17 1254      PT SHORT TERM GOAL #1   Title  same as LTGs        PT Long Term Goals - 11/06/17 1102      PT LONG TERM GOAL #1   Title  Pt will be IND in HEP to improve strength, endurance, and balance. TARGET DATE FOR ALL LTGS: 11/03/17 (all unmet goals will be carried over to new POC: 12/04/17)    Status  On-going      PT LONG TERM GOAL #2   Title  Perform BERG and write goal as indicated.     Status  Achieved      PT LONG TERM GOAL #3   Title  Pt will perform TUG with LRAD in </=13.5 sec. to decr. falls risk.  Status  Achieved      PT LONG TERM GOAL #4   Title  Pt will improve gait speed to >/=2.39f/sec. with LRAD , to  safely amb. in the community.     Status  Partially Met      PT LONG TERM GOAL #5   Title  Pt will amb. 600' over even/uneven terrain with LRAD, at MOD I level, to improve functional mobility.     Status  On-going      PT LONG TERM GOAL #6   Title  Pt will improve BERG score to >/=47/56 to decr. falls risk.     Baseline  43/56 on 11/06/17, so updated.    Status  Revised            Plan - 11/06/17 1100    Clinical Impression Statement  Pt demonstrated progress, as she met LTGs 2 and 6. Pt partially met LTG 4. PT will assess remaining LTGs next session. Pt's BERG score indicates pt is at moderate risk for falls. Pt would continue to benefit from skilled PT, therefore, PT requesting additional 2x/week for 4 weeks to continue progress towards unmet goals and will modify BERG goal. PT will send recert to MD.     Rehab Potential  Good    Clinical Impairments Affecting Rehab Potential  see above    PT Frequency  2x / week requesting add'l 2x/week for 4 weeks    PT Duration  4 weeks pt limited by co-pay    PT Treatment/Interventions  ADLs/Self Care Home Management;Biofeedback;Electrical Stimulation;Therapeutic exercise;Manual techniques;Therapeutic activities;Moist Heat;Functional mobility training;Orthotic Fit/Training;Stair training;Gait training;Patient/family education;DME Instruction;Neuromuscular re-education;Balance training;Vestibular    PT Next Visit Plan  Finish assessing LTGs Gait with rollator and SPC.        Patient will benefit from skilled therapeutic intervention in order to improve the following deficits and impairments:  Abnormal gait, Decreased endurance, Decreased strength, Pain, Impaired flexibility, Postural dysfunction, Decreased safety awareness, Decreased balance, Decreased mobility, Impaired sensation  Visit Diagnosis: Other abnormalities of gait and mobility - Plan: PT plan of care cert/re-cert  Unsteadiness on feet - Plan: PT plan of care cert/re-cert  Muscle  weakness (generalized) - Plan: PT plan of care cert/re-cert  Chronic pain of right knee - Plan: PT plan of care cert/re-cert     Problem List Patient Active Problem List   Diagnosis Date Noted  . Tachycardia   . Acute deep vein thrombosis (DVT) of right lower extremity ([place] 02/14/2016  . Pulmonary emboli ([place] 02/13/2016  . PE (pulmonary embolism) 02/13/2016  . AKI (acute kidney injury) ([place] 02/13/2016  . Pulmonary embolism with acute cor pulmonale ([place]   . Pelvic mass in female   . Morbid obesity with BMI of 40.0-44.9, adult ([place] 08/20/2015  . 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 Essential hypertension, benign 07/23/2014  . Edema 07/23/2014  . Dyslipidemia 07/23/2014  . Lower extremity numbness 07/23/2014  . 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 Postmenopausal bleeding 03/10/2012  . Endometriosis of pelvis 01/22/2007    Miller,Jennifer L 11/06/2017, 11:05 AM  [place] [address]Phone: [phone]  Fax:  [phone] Name: LCARLEE VONDERHAARMRN: [id number] of Birth: [birth year] JGeoffry Paradise PT,DPT 11/06/17 11:05 AM Phone: [phone]: [phone]

## 2017-11-10 ENCOUNTER — Ambulatory Visit: Payer: PPO

## 2017-11-10 VITALS — HR 74

## 2017-11-10 DIAGNOSIS — R2689 Other abnormalities of gait and mobility: Secondary | ICD-10-CM | POA: Diagnosis not present

## 2017-11-10 DIAGNOSIS — M6281 Muscle weakness (generalized): Secondary | ICD-10-CM

## 2017-11-10 DIAGNOSIS — R2681 Unsteadiness on feet: Secondary | ICD-10-CM

## 2017-11-10 NOTE — Therapy (Signed)
[facility] [address] [place], [place], [address] Phone: [phone]   Fax:  [phone]  Physical Therapy Treatment  Patient Details  Name: Lauren Santana MRN: [id number] Date of Birth: 1951-06-20 Referring Provider: Dr. Criss Rosales   Encounter Date: 11/10/2017  PT End of Session - 11/10/17 1057    Visit Number  6    Number of Visits  13    Date for PT Re-Evaluation  12/05/17    Authorization Type  Health team Medicare? G-code and PN every 10th visit.     PT Start Time  1015    PT Stop Time  1054    PT Time Calculation (min)  39 min    Equipment Utilized During Treatment  -- min guard to S PRN    Activity Tolerance  Patient tolerated treatment well    Behavior During Therapy  WFL for tasks assessed/performed       Past Medical History:  Diagnosis Date  . Arthritis   . Edema    B/LLE  . High cholesterol   . Hypertension   . Numbness    B/LLE  . Wears glasses     Past Surgical History:  Procedure Laterality Date  . ABDOMINAL HYSTERECTOMY     and BSO  . [place]  . COLONOSCOPY    . HERNIA REPAIR  2001   incisional  . MULTIPLE TOOTH EXTRACTIONS  2011  . TOTAL KNEE ARTHROPLASTY Left 07/17/2014   Procedure: TOTAL KNEE ARTHROPLASTY;  Surgeon: Kerin Salen, MD;  Location: [facility];  Service: Orthopedics;  Laterality: Left;    Vitals:   11/10/17 1018  Pulse: 74  SpO2: 97%  vitals after exercise.  Subjective Assessment - 11/10/17 1018    Subjective  Pt denied falls since last visit. Pt reported endometriosis pain this morning, but feels better now.     Pertinent History  HTN, Hx of L TKA, hx of RLE DVT/PE, dyslipidemia, B cataracts, B knee arthritis (waiting for R TKA), endometriosis, wears glasses    Patient Stated Goals  "I want to be able to feel confident taking my time when walking slow." "I don't want to take baby steps any more." She would like to get back to exercising.     Currently in Pain?  Yes    Pain Score  -- pt does not to report actual number as she feels blessed right now    Pain Location  Abdomen endometriosis    Pain Descriptors / Indicators  Aching    Pain Type  Chronic pain    Pain Onset  More than a month ago    Pain Frequency  Constant                           Balance Exercises - 11/10/17 1023      OTAGO PROGRAM   Head Movements  Sitting;5 reps    Neck Movements  Sitting;5 reps    Back Extension  Standing;5 reps    Trunk Movements  Standing;5 reps    Ankle Movements  Sitting;10 reps    Knee Extensor  20 reps    Knee Flexor  20 reps x20 reps in sitting for RLE and x20 standing/sit for LLE    Hip ABductor  20 reps seated for RLE and standing for LLE    Ankle Plantorflexors  20 reps, support    Ankle Dorsiflexors  20 reps, support  Knee Bends  10 reps, support    Backwards Walking  Support    Sideways Walking  Assistive device    Tandem Stance  10 seconds, support    One Leg Stand  10 seconds, support    Sit to Stand  10 reps, bilateral support x20 reps (last 10 reps with 1 UE support)    Overall OTAGO Comments  Pt required frequent standing and seated rest breaks 2/2 fatigue and pain. Pt required cuess and demo for technique. Pt also performed seated R ankle eversion 2x10 reps.         PT Education - 11/10/17 1056    Education provided  Yes    Education Details  PT reviewed OTAGO HEP. PT reiterated the importance of performing entire HEP 3x/week, as pt reports she's only performing some of the OTAGO.     Person(s) Educated  Patient    Methods  Explanation;Demonstration    Comprehension  Verbalized understanding;Returned demonstration       PT Short Term Goals - 10/06/17 1254      PT SHORT TERM GOAL #1   Title  same as LTGs        PT Long Term Goals - 11/10/17 1058      PT LONG TERM GOAL #1   Title  Pt will be IND in HEP to improve strength, endurance, and balance. TARGET DATE FOR ALL LTGS:  11/03/17 (all unmet goals will be carried over to new POC: 12/04/17)    Status  Not Met      PT LONG TERM GOAL #2   Title  Perform BERG and write goal as indicated.     Status  Achieved      PT LONG TERM GOAL #3   Title  Pt will perform TUG with LRAD in </=13.5 sec. to decr. falls risk.     Status  Achieved      PT LONG TERM GOAL #4   Title  Pt will improve gait speed to >/=2.35f/sec. with LRAD , to safely amb. in the community.     Status  Partially Met      PT LONG TERM GOAL #5   Title  Pt will amb. 600' over even/uneven terrain with LRAD, at MOD I level, to improve functional mobility.     Status  On-going      PT LONG TERM GOAL #6   Title  Pt will improve BERG score to >/=47/56 to decr. falls risk.     Baseline  43/56 on 11/06/17, so updated.    Status  Revised            Plan - 11/10/17 1057    Clinical Impression Statement  Pt partially met LTG 1(HEP), as she required cues and demo for some activities. Pt continues to require seated and standing rest breaks 2/2 fatigue and R knee pain (unable to rate). Pt would continue to benefit from skilled PT to improve safety during functional mobility.     Rehab Potential  Good    Clinical Impairments Affecting Rehab Potential  see above    PT Frequency  2x / week requesting add'l 2x/week for 4 weeks    PT Duration  4 weeks pt limited by co-pay    PT Treatment/Interventions  ADLs/Self Care Home Management;Biofeedback;Electrical Stimulation;Therapeutic exercise;Manual techniques;Therapeutic activities;Moist Heat;Functional mobility training;Orthotic Fit/Training;Stair training;Gait training;Patient/family education;DME Instruction;Neuromuscular re-education;Balance training;Vestibular    PT Next Visit Plan   Gait with rollator and SPC.  Patient will benefit from skilled therapeutic intervention in order to improve the following deficits and impairments:  Abnormal gait, Decreased endurance, Decreased strength, Pain, Impaired  flexibility, Postural dysfunction, Decreased safety awareness, Decreased balance, Decreased mobility, Impaired sensation  Visit Diagnosis: Other abnormalities of gait and mobility  Unsteadiness on feet  Muscle weakness (generalized)     Problem List Patient Active Problem List   Diagnosis Date Noted  . Tachycardia   . Acute deep vein thrombosis (DVT) of right lower extremity ([place]) 02/14/2016  . Pulmonary emboli ([place]) 02/13/2016  . PE (pulmonary embolism) 02/13/2016  . AKI (acute kidney injury) ([place]) 02/13/2016  . Pulmonary embolism with acute cor pulmonale ([place])   . Pelvic mass in female   . Morbid obesity with BMI of 40.0-44.9, adult ([place]) 08/20/2015  . 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 Essential hypertension, benign 07/23/2014  . Edema 07/23/2014  . Dyslipidemia 07/23/2014  . Lower extremity numbness 07/23/2014  . 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 Postmenopausal bleeding 03/10/2012  . Endometriosis of pelvis 01/22/2007    Jaklyn Alen L 11/10/2017, 10:59 AM  [facility] [address] [place], [place], [address] Phone: [phone]   Fax:  [phone]  Name: Lauren Santana MRN: [id number] Date of Birth: 1951/01/31  Geoffry Paradise, PT,DPT 11/10/17 10:59 AM Phone: [phone] Fax: [phone]

## 2017-11-19 ENCOUNTER — Ambulatory Visit: Payer: PPO

## 2017-11-19 DIAGNOSIS — R2689 Other abnormalities of gait and mobility: Secondary | ICD-10-CM

## 2017-11-19 DIAGNOSIS — M6281 Muscle weakness (generalized): Secondary | ICD-10-CM

## 2017-11-19 DIAGNOSIS — R2681 Unsteadiness on feet: Secondary | ICD-10-CM

## 2017-11-19 NOTE — Therapy (Signed)
[facility] [address] [place], [place], [address] Phone: [phone]   Fax:  [phone]  Physical Therapy Treatment  Patient Details  Name: Lauren Santana MRN: [id number] Date of Birth: 06/06/1951 Referring Provider: Dr. Criss Rosales   Encounter Date: 11/19/2017  PT End of Session - 11/19/17 0930    Visit Number  7    Number of Visits  13    Date for PT Re-Evaluation  12/05/17    Authorization Type  Health team Medicare? G-code and PN every 10th visit.     PT Start Time  [id number]    PT Stop Time  0927    PT Time Calculation (min)  38 min    Equipment Utilized During Treatment  -- S prn    Activity Tolerance  Patient tolerated treatment well    Behavior During Therapy  WFL for tasks assessed/performed       Past Medical History:  Diagnosis Date  . Arthritis   . Edema    B/LLE  . High cholesterol   . Hypertension   . Numbness    B/LLE  . Wears glasses     Past Surgical History:  Procedure Laterality Date  . ABDOMINAL HYSTERECTOMY     and BSO  . [place]  . COLONOSCOPY    . HERNIA REPAIR  2001   incisional  . MULTIPLE TOOTH EXTRACTIONS  2011  . TOTAL KNEE ARTHROPLASTY Left 07/17/2014   Procedure: TOTAL KNEE ARTHROPLASTY;  Surgeon: Kerin Salen, MD;  Location: [facility];  Service: Orthopedics;  Laterality: Left;    There were no vitals filed for this visit.  Subjective Assessment - 11/19/17 0851    Subjective  Pt denied falls or changes since last visit. Pt reports she hasn't performed HEP yet, and would like to review again "as if I'm doing it at home".     Pertinent History  HTN, Hx of L TKA, hx of RLE DVT/PE, dyslipidemia, B cataracts, B knee arthritis (waiting for R TKA), endometriosis, wears glasses    Patient Stated Goals  "I want to be able to feel confident taking my time when walking slow." "I don't want to take baby steps any more." She would like to get back to exercising.      Currently in Pain?  Yes    Pain Score  -- unable to rate    Pain Location  Abdomen    Pain Descriptors / Indicators  Aching    Pain Onset  More than a month ago    Pain Frequency  Constant                      [facility] Adult PT Treatment/Exercise - 11/19/17 0920      Ambulation/Gait   Ambulation/Gait  Yes    Ambulation/Gait Assistance  5: Supervision    Ambulation/Gait Assistance Details  Cues for adjusting SPC height, as pt wished it to be taller, cues for sequencing.    Ambulation Distance (Feet)  96 Feet x2    Assistive device  Straight cane    Gait Pattern  Step-through pattern;Decreased stride length;Trunk flexed;Decreased hip/knee flexion - right;Decreased weight shift to right    Ambulation Surface  Level;Indoor    Gait Comments  Pt required seated rest breaks 2/2 R knee pain.          Balance Exercises - 11/19/17 0854      OTAGO PROGRAM   Head Movements  5 reps;Sitting    Neck Movements  Sitting;5 reps    Back Extension  Standing;5 reps    Trunk Movements  Standing;5 reps    Ankle Movements  Sitting;10 reps    Knee Extensor  20 reps    Knee Flexor  10 reps seated    Hip ABductor  10 reps    Ankle Plantorflexors  20 reps, support    Ankle Dorsiflexors  20 reps, support    Knee Bends  10 reps, support    Backwards Walking  Support    Sideways Walking  Assistive device    Tandem Stance  10 seconds, support    One Leg Stand  10 seconds, support    Sit to Stand  10 reps, bilateral support    Overall OTAGO Comments  Pt required cues for technique an seated rest breaks 2/2 fatigue. SaO2 on room air: 95-96% and HR: 80bpm.        PT Education - 11/19/17 0929    Education provided  Yes    Education Details  PT reviewed OTAGO again per pt request, she wanted to go through it and ensure she was performing correctly. PT adding comments to assist with techinque.     Person(s) Educated  Patient    Methods  Demonstration;Explanation;Handout;Verbal cues     Comprehension  Returned demonstration;Verbalized understanding       PT Short Term Goals - 10/06/17 1254      PT SHORT TERM GOAL #1   Title  same as LTGs        PT Long Term Goals - 11/10/17 1058      PT LONG TERM GOAL #1   Title  Pt will be IND in HEP to improve strength, endurance, and balance. TARGET DATE FOR ALL LTGS: 11/03/17 (all unmet goals will be carried over to new POC: 12/04/17)    Status  Not Met      PT LONG TERM GOAL #2   Title  Perform BERG and write goal as indicated.     Status  Achieved      PT LONG TERM GOAL #3   Title  Pt will perform TUG with LRAD in </=13.5 sec. to decr. falls risk.     Status  Achieved      PT LONG TERM GOAL #4   Title  Pt will improve gait speed to >/=2.46f/sec. with LRAD , to safely amb. in the community.     Status  Partially Met      PT LONG TERM GOAL #5   Title  Pt will amb. 600' over even/uneven terrain with LRAD, at MOD I level, to improve functional mobility.     Status  On-going      PT LONG TERM GOAL #6   Title  Pt will improve BERG score to >/=47/56 to decr. falls risk.     Baseline  43/56 on 11/06/17, so updated.    Status  Revised            Plan - 11/19/17 0930    Clinical Impression Statement  Today's skilled session focused on ensuring pt performing OTAGO with proper technique at home, per pt request. PT also had pt amb. with SPC, household distances. Pt continues to require seated rest breaks 2/2 R knee pain and fatigue.  Continue with POC.     Rehab Potential  Good    Clinical Impairments Affecting Rehab Potential  see above    PT Frequency  2x / week requesting  add'l 2x/week for 4 weeks    PT Duration  4 weeks pt limited by co-pay    PT Treatment/Interventions  ADLs/Self Care Home Management;Biofeedback;Electrical Stimulation;Therapeutic exercise;Manual techniques;Therapeutic activities;Moist Heat;Functional mobility training;Orthotic Fit/Training;Stair training;Gait training;Patient/family education;DME  Instruction;Neuromuscular re-education;Balance training;Vestibular    PT Next Visit Plan   Gait with rollator and SPC. High level balance.       Patient will benefit from skilled therapeutic intervention in order to improve the following deficits and impairments:  Abnormal gait, Decreased endurance, Decreased strength, Pain, Impaired flexibility, Postural dysfunction, Decreased safety awareness, Decreased balance, Decreased mobility, Impaired sensation  Visit Diagnosis: Other abnormalities of gait and mobility  Unsteadiness on feet  Muscle weakness (generalized)     Problem List Patient Active Problem List   Diagnosis Date Noted  . Tachycardia   . Acute deep vein thrombosis (DVT) of right lower extremity ([facility]) 02/14/2016  . Pulmonary emboli ([facility]) 02/13/2016  . PE (pulmonary embolism) 02/13/2016  . AKI (acute kidney injury) ([facility]) 02/13/2016  . Pulmonary embolism with acute cor pulmonale ([facility])   . Pelvic mass in female   . Morbid obesity with BMI of 40.0-44.9, adult ([facility]) 08/20/2015  . 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 Essential hypertension, benign 07/23/2014  . Edema 07/23/2014  . Dyslipidemia 07/23/2014  . Lower extremity numbness 07/23/2014  . 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 Postmenopausal bleeding 03/10/2012  . Endometriosis of pelvis 01/22/2007    Phinehas Grounds L 11/19/2017, 9:32 AM  [facility] [address] [place], [place], [address] Phone: [phone]   Fax:  [phone]  Name: Lauren Santana MRN: [id number] Date of Birth: June 07, 1951  Geoffry Paradise, PT,DPT 11/19/17 9:32 AM Phone: [phone] Fax: [phone]

## 2017-11-25 DIAGNOSIS — Z90710 Acquired absence of both cervix and uterus: Secondary | ICD-10-CM | POA: Diagnosis not present

## 2017-11-25 DIAGNOSIS — N809 Endometriosis, unspecified: Secondary | ICD-10-CM | POA: Diagnosis not present

## 2017-11-25 DIAGNOSIS — Z90722 Acquired absence of ovaries, bilateral: Secondary | ICD-10-CM | POA: Diagnosis not present

## 2017-12-01 ENCOUNTER — Ambulatory Visit: Payer: PPO | Attending: Family Medicine

## 2017-12-01 DIAGNOSIS — M6281 Muscle weakness (generalized): Secondary | ICD-10-CM | POA: Diagnosis not present

## 2017-12-01 DIAGNOSIS — R2689 Other abnormalities of gait and mobility: Secondary | ICD-10-CM | POA: Diagnosis not present

## 2017-12-01 DIAGNOSIS — R2681 Unsteadiness on feet: Secondary | ICD-10-CM | POA: Insufficient documentation

## 2017-12-01 NOTE — Therapy (Signed)
[facility] [address] [place], [place], [address] Phone: [phone]   Fax:  [phone]  Physical Therapy Treatment  Patient Details  Name: Lauren Santana MRN: [id number] Date of Birth: 09-08-1951 Referring Provider: Dr. Criss Rosales   Encounter Date: 12/01/2017  PT End of Session - 12/01/17 1147    Visit Number  8    Number of Visits  13    Date for PT Re-Evaluation  12/05/17    Authorization Type  Health team Medicare? G-code and PN every 10th visit.     PT Start Time  1103    PT Stop Time  1144    PT Time Calculation (min)  41 min    Equipment Utilized During Treatment  -- S prn    Activity Tolerance  Patient tolerated treatment well;Patient limited by pain    Behavior During Therapy  WFL for tasks assessed/performed       Past Medical History:  Diagnosis Date  . Arthritis   . Edema    B/LLE  . High cholesterol   . Hypertension   . Numbness    B/LLE  . Wears glasses     Past Surgical History:  Procedure Laterality Date  . ABDOMINAL HYSTERECTOMY     and BSO  . [place]  . COLONOSCOPY    . HERNIA REPAIR  2001   incisional  . MULTIPLE TOOTH EXTRACTIONS  2011  . TOTAL KNEE ARTHROPLASTY Left 07/17/2014   Procedure: TOTAL KNEE ARTHROPLASTY;  Surgeon: Kerin Salen, MD;  Location: [facility];  Service: Orthopedics;  Laterality: Left;    There were no vitals filed for this visit.  Subjective Assessment - 12/01/17 1105    Subjective  Pt denied falls or changes since last visit. Pt had appt. with MD regarding abdominal mass and was told it has not changed. Pt has been having difficulty stopping sugar intake. Pt has been sad, as a family memeber recently passed and she doesn't feel comforted by friends or family. Pt performed HEP intermittently, and is on a 20 day fast program. Pt uses SPC when taking out the trash and groceries to/from car.    Pertinent History  HTN, Hx of L TKA, hx of RLE  DVT/PE, dyslipidemia, B cataracts, B knee arthritis (waiting for R TKA), endometriosis, wears glasses    Patient Stated Goals  "I want to be able to feel confident taking my time when walking slow." "I don't want to take baby steps any more." She would like to get back to exercising.     Currently in Pain?  Yes unable to rate    Pain Score  -- unable to rate    Pain Location  -- R knee and abdomen    Pain Orientation  Right    Pain Descriptors / Indicators  Aching    Pain Type  Chronic pain    Pain Onset  More than a month ago    Pain Frequency  Constant    Aggravating Factors   moving, standing    Pain Relieving Factors  rest                      [facility] Adult PT Treatment/Exercise - 12/01/17 1118      Ambulation/Gait   Ambulation/Gait  Yes    Ambulation/Gait Assistance  5: Supervision    Ambulation/Gait Assistance Details  Cues for sequencing to reduce R knee pain, however, pt unwilling to try  as this was challenging. Cues for upright posture and stride length. Cues for measuring correct height of SPC.    Ambulation Distance (Feet)  117 Feet x3    Assistive device  Straight cane    Gait Pattern  Step-through pattern;Decreased stride length;Trunk flexed;Decreased hip/knee flexion - right;Decreased weight shift to right    Ambulation Surface  Level;Indoor      High Level Balance   High Level Balance Activities  Side stepping;Head turns;Marching forwards;Backward walking    High Level Balance Comments  Holding onto counter with 1 hand and cues and demo for technique. Performed with S for safety. Performed 4x/activity. Seated rest breaks 2/2 pain and fatigue.            Self Care: PT Education - 12/01/17 1146    Education provided  Yes    Education Details  PT demonstrated how to sequence with SPC to decr. R knee pain, however, pt did not wish to amb. in this manner as she was used to [facility] in R hand. PT reiterated the importance of performing HEP at home in order to  maximize functional gains. Pt reported feeling sad about death in family and not having great family/friend support. PT encouraged pt to reach out to her pastor, as pt speaks about church often.     Person(s) Educated  Patient    Methods  Explanation;Demonstration;Verbal cues;Tactile cues    Comprehension  Verbalized understanding;Returned demonstration;Need further instruction       PT Short Term Goals - 10/06/17 1254      PT SHORT TERM GOAL #1   Title  same as LTGs        PT Long Term Goals - 11/10/17 1058      PT LONG TERM GOAL #1   Title  Pt will be IND in HEP to improve strength, endurance, and balance. TARGET DATE FOR ALL LTGS: 11/03/17 (all unmet goals will be carried over to new POC: 12/04/17)    Status  Not Met      PT LONG TERM GOAL #2   Title  Perform BERG and write goal as indicated.     Status  Achieved      PT LONG TERM GOAL #3   Title  Pt will perform TUG with LRAD in </=13.5 sec. to decr. falls risk.     Status  Achieved      PT LONG TERM GOAL #4   Title  Pt will improve gait speed to >/=2.21f/sec. with LRAD , to safely amb. in the community.     Status  Partially Met      PT LONG TERM GOAL #5   Title  Pt will amb. 600' over even/uneven terrain with LRAD, at MOD I level, to improve functional mobility.     Status  On-going      PT LONG TERM GOAL #6   Title  Pt will improve BERG score to >/=47/56 to decr. falls risk.     Baseline  43/56 on 11/06/17, so updated.    Status  Revised            Plan - 12/01/17 1153    Clinical Impression Statement  Pt demonstrated progress as she tolerated high level balance activities well, no overt LOB noted but pt did perform with 1 UE support on counter for safety. Pt continues to require rest breaks 2/2 fatigue and R knee pain. No LOB noted during gait with SPC over even terrain, pt did not with to trial  gait with SPC in L hand to decr. R knee pain. Pt would continue to benefit from skilled PT to improve safety during  functional mobility.     Rehab Potential  Good    Clinical Impairments Affecting Rehab Potential  see above    PT Frequency  2x / week requesting add'l 2x/week for 4 weeks    PT Duration  4 weeks pt limited by co-pay    PT Treatment/Interventions  ADLs/Self Care Home Management;Biofeedback;Electrical Stimulation;Therapeutic exercise;Manual techniques;Therapeutic activities;Moist Heat;Functional mobility training;Orthotic Fit/Training;Stair training;Gait training;Patient/family education;DME Instruction;Neuromuscular re-education;Balance training;Vestibular    PT Next Visit Plan  Conitnue Gait with rollator and SPC over even/uneven terrain. High level balance. Renew, as pt missed therapy.       Patient will benefit from skilled therapeutic intervention in order to improve the following deficits and impairments:  Abnormal gait, Decreased endurance, Decreased strength, Pain, Impaired flexibility, Postural dysfunction, Decreased safety awareness, Decreased balance, Decreased mobility, Impaired sensation  Visit Diagnosis: Other abnormalities of gait and mobility  Unsteadiness on feet  Muscle weakness (generalized)     Problem List Patient Active Problem List   Diagnosis Date Noted  . Tachycardia   . Acute deep vein thrombosis (DVT) of right lower extremity ([facility]) 02/14/2016  . Pulmonary emboli ([facility]) 02/13/2016  . PE (pulmonary embolism) 02/13/2016  . AKI (acute kidney injury) ([facility]) 02/13/2016  . Pulmonary embolism with acute cor pulmonale ([facility])   . Pelvic mass in female   . Morbid obesity with BMI of 40.0-44.9, adult ([facility]) 08/20/2015  . 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 Essential hypertension, benign 07/23/2014  . Edema 07/23/2014  . Dyslipidemia 07/23/2014  . Lower extremity numbness 07/23/2014  . 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 Postmenopausal bleeding 03/10/2012  . Endometriosis of pelvis 01/22/2007    Andreia Gandolfi L 12/01/2017, 11:56 AM  [facility] [address] [place], [place], [address] Phone: [phone]   Fax:  [phone]  Name: Lauren Santana MRN: [id number] Date of Birth: [birth year]  Geoffry Paradise, PT,DPT 12/01/17 11:57 AM Phone: [phone] Fax: [phone]

## 2017-12-04 ENCOUNTER — Ambulatory Visit: Payer: PPO

## 2017-12-04 VITALS — HR 82

## 2017-12-04 DIAGNOSIS — R2689 Other abnormalities of gait and mobility: Secondary | ICD-10-CM

## 2017-12-04 DIAGNOSIS — R2681 Unsteadiness on feet: Secondary | ICD-10-CM

## 2017-12-04 NOTE — Therapy (Signed)
[facility] [address] [place], [place], [address] Phone: [phone]   Fax:  [phone]  Physical Therapy Treatment  Patient Details  Name: Lauren Santana MRN: [id number] Date of Birth: [birth year] Referring Provider: Dr. Criss Rosales   Encounter Date: 12/04/2017  PT End of Session - 12/04/17 1149    Visit Number  9    Number of Visits  13    Date for PT Re-Evaluation  01/05/18    Authorization Type  Health team Medicare?    PT Start Time  1105    PT Stop Time  1145    PT Time Calculation (min)  40 min    Equipment Utilized During Treatment  -- S prn    Activity Tolerance  Patient tolerated treatment well;Patient limited by pain    Behavior During Therapy  WFL for tasks assessed/performed       Past Medical History:  Diagnosis Date  . Arthritis   . Edema    B/LLE  . High cholesterol   . Hypertension   . Numbness    B/LLE  . Wears glasses     Past Surgical History:  Procedure Laterality Date  . ABDOMINAL HYSTERECTOMY     and BSO  . [place]  . COLONOSCOPY    . HERNIA REPAIR  2001   incisional  . MULTIPLE TOOTH EXTRACTIONS  2011  . TOTAL KNEE ARTHROPLASTY Left 07/17/2014   Procedure: TOTAL KNEE ARTHROPLASTY;  Surgeon: Kerin Salen, MD;  Location: [facility];  Service: Orthopedics;  Laterality: Left;    Vitals:   12/04/17 1107  Pulse: 82  SpO2: 97%    Subjective Assessment - 12/04/17 1107    Subjective  Pt denied falls or changes since last visit. Pt reported tingling in B hands, as she just took Gabapentin prior to session. Pt reported she's performed HEP at night intermittently.     Pertinent History  HTN, Hx of L TKA, hx of RLE DVT/PE, dyslipidemia, B cataracts, B knee arthritis (waiting for R TKA), endometriosis, wears glasses    Patient Stated Goals  "I want to be able to feel confident taking my time when walking slow." "I don't want to take baby steps any more." She would  like to get back to exercising.     Currently in Pain?  Yes    Pain Score  7     Pain Location  -- diffuse pain    Pain Orientation  -- diffuse pain    Pain Descriptors / Indicators  Aching    Pain Type  Chronic pain    Pain Onset  More than a month ago    Pain Frequency  Constant    Aggravating Factors   moving, standing    Pain Relieving Factors  rest                      [facility] Adult PT Treatment/Exercise - 12/04/17 1108      Ambulation/Gait   Ambulation/Gait  Yes    Ambulation/Gait Assistance  5: Supervision    Ambulation/Gait Assistance Details  Pt amb. with and without performing head turns, over uneven surfaces (mats). No overt LOB. Pt required rest break after each lap. Pt also amb. around obstacles and between obstacles. Pt took standing and seated rest breaks. Cues to sequence while stepping over mats (blue and red mat).     Ambulation Distance (Feet)  117 Feet x8   Assistive device  Straight cane    Gait Pattern  Step-through pattern;Decreased stride length;Trunk flexed;Decreased hip/knee flexion - right;Decreased weight shift to right    Ambulation Surface  Level;Indoor    Gait velocity  1.53f/sec and 1.945fsec. with SPC towards end of session               PT Short Term Goals - 10/06/17 1254      PT SHORT TERM GOAL #1   Title  same as LTGs        PT Long Term Goals - 12/04/17 1151      PT LONG TERM GOAL #1   Title  Pt will be IND in HEP to improve strength, endurance, and balance. TARGET DATE FOR ALL LTGS: 12/04/17 (all unmet goals will be carried over to new POC: 12/25/17)    Status  Not Met      PT LONG TERM GOAL #2   Title  Perform BERG and write goal as indicated.     Status  Achieved      PT LONG TERM GOAL #3   Title  Pt will perform TUG with LRAD in </=13.5 sec. to decr. falls risk.     Status  Achieved      PT LONG TERM GOAL #4   Title  Pt will improve gait speed to >/=2.6251fec. with LRAD , to safely amb. in the community.      Status  Partially Met      PT LONG TERM GOAL #5   Title  Pt will amb. 600' over even/uneven terrain with LRAD, at MOD I level, to improve functional mobility.     Status  On-going      PT LONG TERM GOAL #6   Title  Pt will improve BERG score to >/=47/56 to decr. falls risk.     Baseline  43/56 on 11/06/17, so updated.    Status  Revised            Plan - 12/04/17 1150    Clinical Impression Statement  Today's skilled session focused on amb. with SPC over even/uneven terrain and traversing obstacles, as pt would like to use SPC in closed environments (more narrow spaces). Pt continues to require frequent standing and seated rest breaks 2/2 fatigue and R knee pain but is very motivated to continue therapy after resting. PT renewing for additional 2/week for 4 weeks, as pt missed appt's and will most likley wrap up around 12/25/17. Continue with POC.     History and Personal Factors relevant to plan of care:  Pt lives alone and is waiting for R TKA     Rehab Potential  Good    Clinical Impairments Affecting Rehab Potential  see above    PT Frequency  2x / week requesting add'l 2x/week for 4 weeks    PT Duration  4 weeks pt limited by co-pay    PT Treatment/Interventions  ADLs/Self Care Home Management;Biofeedback;Electrical Stimulation;Therapeutic exercise;Manual techniques;Therapeutic activities;Moist Heat;Functional mobility training;Orthotic Fit/Training;Stair training;Gait training;Patient/family education;DME Instruction;Neuromuscular re-education;Balance training;Vestibular    PT Next Visit Plan  Conitnue Gait with rollator and SPC over even/uneven terrain. High level balance.        Patient will benefit from skilled therapeutic intervention in order to improve the following deficits and impairments:  Abnormal gait, Decreased endurance, Decreased strength, Pain, Impaired flexibility, Postural dysfunction, Decreased safety awareness, Decreased balance, Decreased mobility, Impaired  sensation  Visit Diagnosis: Other abnormalities of gait and mobility  Unsteadiness on feet     Problem List  Patient Active Problem List   Diagnosis Date Noted  . Tachycardia   . Acute deep vein thrombosis (DVT) of right lower extremity ([facility]) 02/14/2016  . Pulmonary emboli ([facility]) 02/13/2016  . PE (pulmonary embolism) 02/13/2016  . AKI (acute kidney injury) ([facility]) 02/13/2016  . Pulmonary embolism with acute cor pulmonale ([facility])   . Pelvic mass in female   . Morbid obesity with BMI of 40.0-44.9, adult ([facility]) 08/20/2015  . 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 Essential hypertension, benign 07/23/2014  . Edema 07/23/2014  . Dyslipidemia 07/23/2014  . Lower extremity numbness 07/23/2014  . 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 Postmenopausal bleeding 03/10/2012  . Endometriosis of pelvis 01/22/2007    Camdin Hegner L 12/04/2017, 11:57 AM  [facility] [address] [place], [place], [address] Phone: [phone]   Fax:  [phone]  Name: SAMEERAH NACHTIGAL MRN: [id number] Date of Birth: 08-Apr-1951  Geoffry Paradise, PT,DPT 12/04/17 11:57 AM Phone: [phone] Fax: [phone]

## 2017-12-08 ENCOUNTER — Ambulatory Visit: Payer: PPO

## 2017-12-08 VITALS — BP 166/82 | HR 79

## 2017-12-08 DIAGNOSIS — M6281 Muscle weakness (generalized): Secondary | ICD-10-CM

## 2017-12-08 DIAGNOSIS — R2681 Unsteadiness on feet: Secondary | ICD-10-CM

## 2017-12-08 DIAGNOSIS — R2689 Other abnormalities of gait and mobility: Secondary | ICD-10-CM | POA: Diagnosis not present

## 2017-12-08 NOTE — Therapy (Signed)
[facility] [address] [place], [place], [address] Phone: [phone]   Fax:  [phone]  Physical Therapy Treatment  Patient Details  Name: Lauren Santana MRN: [id number] Date of Birth: [birth year] Referring Provider: Dr. Criss Rosales   Encounter Date: 12/08/2017  PT End of Session - 12/08/17 1105    Visit Number  10    Number of Visits  13    Date for PT Re-Evaluation  01/05/18    Authorization Type  Health team Medicare?    PT Start Time  1017    PT Stop Time  1100    PT Time Calculation (min)  43 min    Equipment Utilized During Treatment  -- S prn    Activity Tolerance  Patient tolerated treatment well    Behavior During Therapy  WFL for tasks assessed/performed       Past Medical History:  Diagnosis Date  . Arthritis   . Edema    B/LLE  . High cholesterol   . Hypertension   . Numbness    B/LLE  . Wears glasses     Past Surgical History:  Procedure Laterality Date  . ABDOMINAL HYSTERECTOMY     and BSO  . [place]  . COLONOSCOPY    . HERNIA REPAIR  2001   incisional  . MULTIPLE TOOTH EXTRACTIONS  2011  . TOTAL KNEE ARTHROPLASTY Left 07/17/2014   Procedure: TOTAL KNEE ARTHROPLASTY;  Surgeon: Kerin Salen, MD;  Location: [facility];  Service: Orthopedics;  Laterality: Left;    Vitals:   12/08/17 1021  BP: (!) 166/82  Pulse: 79  SpO2: 97%    Subjective Assessment - 12/08/17 1021    Subjective  Pt denied falls or changes since last visit.     Pertinent History  HTN, Hx of L TKA, hx of RLE DVT/PE, dyslipidemia, B cataracts, B knee arthritis (waiting for R TKA), endometriosis, wears glasses    Patient Stated Goals  "I want to be able to feel confident taking my time when walking slow." "I don't want to take baby steps any more." She would like to get back to exercising.     Currently in Pain?  Yes    Pain Score  -- unable to rate    Pain Location  -- diffuse pain    Pain  Orientation  -- all over    Pain Descriptors / Indicators  Aching    Pain Type  Chronic pain    Pain Onset  More than a month ago    Pain Frequency  Constant    Aggravating Factors   moving, standing    Pain Relieving Factors  rest                      [facility] Adult PT Treatment/Exercise - 12/08/17 1025      Ambulation/Gait   Ambulation/Gait  Yes    Ambulation/Gait Assistance  6: Modified independent (Device/Increase time);5: Supervision    Ambulation/Gait Assistance Details  MOD I with rollator (100'), and S with SPC to ensure safety. Pt performed circuit training    Ambulation Distance (Feet)  117 Feet 4    Assistive device  Straight cane;Rollator    Gait Pattern  Step-through pattern;Decreased stride length;Trunk flexed;Decreased hip/knee flexion - right;Decreased weight shift to right    Ambulation Surface  Level;Indoor      High Level Balance   High Level Balance Activities  Side stepping  High Level Balance Comments  Performed in // bars with UE support: 4x10' reps. Cues to keep hips/toes forward.  Performed for balance and strength.      Exercises   Exercises  Knee/Hip      Knee/Hip Exercises: Standing   Heel Raises  Both;1 set;20 reps    Heel Raises Limitations  with UE support for safety. Pt performed heel/toe raises.     Functional Squat  1 set;10 reps with UE support    Functional Squat Limitations  Pt performed to 45 degrees 2/2 R knee pain.    Other Standing Knee Exercises  Pt performed STS txfs x10 reps.  Cues for weight shifting and technique.             PT Education - 12/08/17 1103    Education provided  Yes    Education Details  PT discussed pt looking into Silver Sneakers prior to likely d/c in 2 weeks. Pt agreeable and is familiar with the program. PT educated pt on elevated systolic BP during activity and to go to MD/ED if she experiences sudden weakness, N/T, impaired balance or pain.     Person(s) Educated  Patient    Methods   Explanation    Comprehension  Verbalized understanding       PT Short Term Goals - 10/06/17 1254      PT SHORT TERM GOAL #1   Title  same as LTGs        PT Long Term Goals - 12/04/17 1151      PT LONG TERM GOAL #1   Title  Pt will be IND in HEP to improve strength, endurance, and balance. TARGET DATE FOR ALL LTGS: 12/04/17 (all unmet goals will be carried over to new POC: 12/25/17)    Status  Not Met      PT LONG TERM GOAL #2   Title  Perform BERG and write goal as indicated.     Status  Achieved      PT LONG TERM GOAL #3   Title  Pt will perform TUG with LRAD in </=13.5 sec. to decr. falls risk.     Status  Achieved      PT LONG TERM GOAL #4   Title  Pt will improve gait speed to >/=2.61f/sec. with LRAD , to safely amb. in the community.     Status  Partially Met      PT LONG TERM GOAL #5   Title  Pt will amb. 600' over even/uneven terrain with LRAD, at MOD I level, to improve functional mobility.     Status  On-going      PT LONG TERM GOAL #6   Title  Pt will improve BERG score to >/=47/56 to decr. falls risk.     Baseline  43/56 on 11/06/17, so updated.    Status  Revised            Plan - 12/08/17 1106    Clinical Impression Statement  Pt demonstrated progress, as she was able to perform circuit training to improve endurance, strength, and balance. Pt continues to require seated rest breaks 2/2 fatigue and pain. Pt's systolic BP was elevated during session but pt denied s/s of CVA or MI; PT will continue to monitor. Pt would continue to benefit from skilled PT to improve safety during functional mobility.     Rehab Potential  Good    Clinical Impairments Affecting Rehab Potential  see above    PT Frequency  2x /  week requesting add'l 2x/week for 4 weeks    PT Duration  4 weeks pt limited by co-pay    PT Treatment/Interventions  ADLs/Self Care Home Management;Biofeedback;Electrical Stimulation;Therapeutic exercise;Manual techniques;Therapeutic activities;Moist  Heat;Functional mobility training;Orthotic Fit/Training;Stair training;Gait training;Patient/family education;DME Instruction;Neuromuscular re-education;Balance training;Vestibular    PT Next Visit Plan  Conitnue Gait with rollator and SPC over even/uneven terrain. High level balance.        Patient will benefit from skilled therapeutic intervention in order to improve the following deficits and impairments:  Abnormal gait, Decreased endurance, Decreased strength, Pain, Impaired flexibility, Postural dysfunction, Decreased safety awareness, Decreased balance, Decreased mobility, Impaired sensation  Visit Diagnosis: Other abnormalities of gait and mobility  Muscle weakness (generalized)  Unsteadiness on feet     Problem List Patient Active Problem List   Diagnosis Date Noted  . Tachycardia   . Acute deep vein thrombosis (DVT) of right lower extremity ([facility]) 02/14/2016  . Pulmonary emboli ([facility]) 02/13/2016  . PE (pulmonary embolism) 02/13/2016  . AKI (acute kidney injury) ([facility]) 02/13/2016  . Pulmonary embolism with acute cor pulmonale ([facility])   . Pelvic mass in female   . Morbid obesity with BMI of 40.0-44.9, adult ([facility]) 08/20/2015  . 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 Essential hypertension, benign 07/23/2014  . Edema 07/23/2014  . Dyslipidemia 07/23/2014  . Lower extremity numbness 07/23/2014  . 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 Postmenopausal bleeding 03/10/2012  . Endometriosis of pelvis 01/22/2007    Jahnae Mcadoo L 12/08/2017, 11:09 AM  [facility] [address] [place], [place], [address] Phone: [phone]   Fax:  [phone]  Name: Lauren Santana MRN: [id number] Date of Birth: 04-Dec-1950  Geoffry Paradise, PT,DPT 12/08/17 11:10 AM Phone: [phone] Fax: [phone]

## 2017-12-11 ENCOUNTER — Ambulatory Visit: Payer: PPO

## 2017-12-11 DIAGNOSIS — M6281 Muscle weakness (generalized): Secondary | ICD-10-CM

## 2017-12-11 DIAGNOSIS — R2689 Other abnormalities of gait and mobility: Secondary | ICD-10-CM

## 2017-12-11 DIAGNOSIS — R2681 Unsteadiness on feet: Secondary | ICD-10-CM

## 2017-12-11 NOTE — Therapy (Signed)
[facility] [address] [place], [place], [address] Phone: [phone]   Fax:  [phone]  Physical Therapy Treatment  Patient Details  Name: Lauren Santana MRN: [id number] Date of Birth: [birth year] Referring Provider: Dr. Criss Rosales   Encounter Date: 12/11/2017  PT End of Session - 12/11/17 1208    Visit Number  11    Number of Visits  13    Date for PT Re-Evaluation  01/05/18    Authorization Type  Health team Medicare?    PT Start Time  1103    PT Stop Time  1145    PT Time Calculation (min)  42 min    Equipment Utilized During Treatment  -- S prn    Activity Tolerance  Patient tolerated treatment well    Behavior During Therapy  WFL for tasks assessed/performed       Past Medical History:  Diagnosis Date  . Arthritis   . Edema    B/LLE  . High cholesterol   . Hypertension   . Numbness    B/LLE  . Wears glasses     Past Surgical History:  Procedure Laterality Date  . ABDOMINAL HYSTERECTOMY     and BSO  . [place]  . COLONOSCOPY    . HERNIA REPAIR  2001   incisional  . MULTIPLE TOOTH EXTRACTIONS  2011  . TOTAL KNEE ARTHROPLASTY Left 07/17/2014   Procedure: TOTAL KNEE ARTHROPLASTY;  Surgeon: Kerin Salen, MD;  Location: [facility];  Service: Orthopedics;  Laterality: Left;    There were no vitals filed for this visit.  Subjective Assessment - 12/11/17 1106    Subjective  Pt denied falls or changes since last visit.     Pertinent History  HTN, Hx of L TKA, hx of RLE DVT/PE, dyslipidemia, B cataracts, B knee arthritis (waiting for R TKA), endometriosis, wears glasses    Patient Stated Goals  "I want to be able to feel confident taking my time when walking slow." "I don't want to take baby steps any more." She would like to get back to exercising.     Currently in Pain?  Yes    Pain Score  -- diffuse pain    Pain Location  -- joints    Pain Orientation  -- diffuse pain    Pain  Descriptors / Indicators  Aching    Pain Type  Chronic pain    Pain Onset  More than a month ago    Pain Frequency  Constant    Aggravating Factors   moving, standing    Pain Relieving Factors  rest                      [facility] Adult PT Treatment/Exercise - 12/11/17 1108      Ambulation/Gait   Ambulation/Gait  Yes    Ambulation/Gait Assistance  6: Modified independent (Device/Increase time);5: Supervision    Ambulation/Gait Assistance Details  PT adjusted rollator height to promote upright posture, cues to stay close to rollator during last 30' of amb. Pt required seated rest breaks 2/2 pain and fatigue.     Ambulation Distance (Feet)  117 Feet x4    Assistive device  Rollator    Gait Pattern  Step-through pattern;Decreased stride length;Trunk flexed;Decreased hip/knee flexion - right;Decreased weight shift to right    Ambulation Surface  Level;Indoor      Exercises   Exercises  Knee/Hip      Knee/Hip  Exercises: Aerobic   Nustep  Level 3 resistance for 3 minutes with all 4 extremities to improve endurance and strength.       Knee/Hip Exercises: Standing   Heel Raises  Both;1 set;20 reps    Heel Raises Limitations  with UE support for safety.    Functional Squat  1 set;10 reps    Functional Squat Limitations  Pt performed to 45 degrees 2/2 R knee pain.    Other Standing Knee Exercises  Pt performed STS txfs x10 reps.      Cues for technique during all activities.         PT Education - 12/11/17 1157    Education provided  Yes    Education Details  PT adjusted height of rollator to improve upright posture and educated pt to stay within rollator to promote proper posture. Pt also reported she did look into Silver Sneakers and is going to join a program.     Person(s) Educated  Patient    Methods  Explanation    Comprehension  Verbalized understanding       PT Short Term Goals - 10/06/17 1254      PT SHORT TERM GOAL #1   Title  same as LTGs        PT  Long Term Goals - 12/04/17 1151      PT LONG TERM GOAL #1   Title  Pt will be IND in HEP to improve strength, endurance, and balance. TARGET DATE FOR ALL LTGS: 12/04/17 (all unmet goals will be carried over to new POC: 12/25/17)    Status  Not Met      PT LONG TERM GOAL #2   Title  Perform BERG and write goal as indicated.     Status  Achieved      PT LONG TERM GOAL #3   Title  Pt will perform TUG with LRAD in </=13.5 sec. to decr. falls risk.     Status  Achieved      PT LONG TERM GOAL #4   Title  Pt will improve gait speed to >/=2.54f/sec. with LRAD , to safely amb. in the community.     Status  Partially Met      PT LONG TERM GOAL #5   Title  Pt will amb. 600' over even/uneven terrain with LRAD, at MOD I level, to improve functional mobility.     Status  On-going      PT LONG TERM GOAL #6   Title  Pt will improve BERG score to >/=47/56 to decr. falls risk.     Baseline  43/56 on 11/06/17, so updated.    Status  Revised            Plan - 12/11/17 1209    Clinical Impression Statement  Pt continues to demonstrate progress, as she was able to continue circuit training to improve strength, balance, and endurance. PT added NuStep to therex today, as pt's goal is to use a recumbent bike for exercise. Pt continues to require seated rest breaks 2/2 fatigue and pain. Continue with POC.     Rehab Potential  Good    Clinical Impairments Affecting Rehab Potential  see above    PT Frequency  2x / week requesting add'l 2x/week for 4 weeks    PT Duration  4 weeks pt limited by co-pay    PT Treatment/Interventions  ADLs/Self Care Home Management;Biofeedback;Electrical Stimulation;Therapeutic exercise;Manual techniques;Therapeutic activities;Moist Heat;Functional mobility training;Orthotic Fit/Training;Stair training;Gait training;Patient/family education;DME Instruction;Neuromuscular re-education;Balance training;Vestibular  PT Next Visit Plan  Conitnue Gait with rollator and SPC over  even/uneven terrain. High level balance.        Patient will benefit from skilled therapeutic intervention in order to improve the following deficits and impairments:  Abnormal gait, Decreased endurance, Decreased strength, Pain, Impaired flexibility, Postural dysfunction, Decreased safety awareness, Decreased balance, Decreased mobility, Impaired sensation  Visit Diagnosis: Other abnormalities of gait and mobility  Muscle weakness (generalized)  Unsteadiness on feet     Problem List Patient Active Problem List   Diagnosis Date Noted  . Tachycardia   . Acute deep vein thrombosis (DVT) of right lower extremity ([facility]) 02/14/2016  . Pulmonary emboli ([facility]) 02/13/2016  . PE (pulmonary embolism) 02/13/2016  . AKI (acute kidney injury) ([facility]) 02/13/2016  . Pulmonary embolism with acute cor pulmonale ([facility])   . Pelvic mass in female   . Morbid obesity with BMI of 40.0-44.9, adult ([facility]) 08/20/2015  . 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 Essential hypertension, benign 07/23/2014  . Edema 07/23/2014  . Dyslipidemia 07/23/2014  . Lower extremity numbness 07/23/2014  . 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 Postmenopausal bleeding 03/10/2012  . Endometriosis of pelvis 01/22/2007    Cataldo Cosgriff L 12/11/2017, 12:11 PM  [facility] [address] [place], [place], [address] Phone: [phone]   Fax:  [phone]  Name: Lauren Santana MRN: [id number] Date of Birth: September 16, 1951  Geoffry Paradise, PT,DPT 12/11/17 12:12 PM Phone: [phone] Fax: [phone]

## 2017-12-15 ENCOUNTER — Ambulatory Visit: Payer: PPO

## 2017-12-15 DIAGNOSIS — M6281 Muscle weakness (generalized): Secondary | ICD-10-CM

## 2017-12-15 DIAGNOSIS — R2689 Other abnormalities of gait and mobility: Secondary | ICD-10-CM | POA: Diagnosis not present

## 2017-12-15 DIAGNOSIS — R2681 Unsteadiness on feet: Secondary | ICD-10-CM

## 2017-12-15 NOTE — Patient Instructions (Signed)
Community Occupational psychologist of Services Cost  A Matter of Balance Class locations vary. Call [place] on Aging for more information.  [email or web address]-[email or web address]/ [phone] 8-Session program addressing the fear of falling and increasing activity levels of older adults Free to minimal cost  A.C.T. By [employer] [address], [place], [place] [address].  BetaBlues.dk [phone]  Personal training, gym, classes including Silver Sneakers* and ACTion for Aging Adults Fee-based  A.H.O.Y. (Add Health to [place]) Airs on Time [employer] 13, M-F at [place]: [employer],  [place] [facility] [place],  [place], [facility], [address] [facility], [place], [place], [place] [address]  [place] Location: Sharrell Ku. [employer] [place]      [phone]  [phone]  [phone]  [phone]  [phone]  [phone]  [phone]  [phone]  [phone]  [phone]    [phone] A total-body conditioning class for adults 77 and older; designed to increase muscular strength, endurance, range of movement, flexibility, balance, agility and coordination Free  [facility] [place], [place] [address] [place]      [address]      [phone]      [place]'s class for individualsreturning to exercise after an injury, before or after surgery or for individuals with complex musculoskeletal issues; designed to improve strength, balance , flexibility      $15/class  [facility] [address] [place], [place] [address] [email or web address] [place] classes for beginners to advanced [place], [place] [address] [email or web address] [place] Chair Exercises Free, ages 77 and older; Ages 43-59 fee based  Lauren Santana, [facility] [address] [place], [place] [address] Seniorcenter_1 .Lauren Santana [phone]  A.H.O.Y. Tai Chi Fee-based Donation based or free  [place] Class locations vary.  Call or email Lauren Santana or view website for more information. [email or web address] .com [email or web address] [phone] Ongoing classes at local [employer]MC[employer] and gyms Fee-based  Silver Sneakers A.C.T. By [place] [employer]: [facility] [place] [phone]  [phone] Classes designed for older adults who want to improve their strength, flexibility, balance and endurance.   Silver sneakers is covered by some insurance plans and includes a fitness center membership at participating locations. Find out more by calling [phone] or visiting [email or web address] Covered by some insurance plans  [facility] [place] [phone] A.H.O.Y., fitness room, personal training, fitness classes for injury prevention, strength, balance, flexibility, water fitness classes Ages 55+: $36 for 6 months; Ages 80-54: $13 for 6 months  Tai Chi for Everybody [facility] [address] [place], [place] [address] Taichiforeverybody_3 .Lauren Santana [phone] Tai Chi classes for beginners to advanced; geared for seniors Donation Based  [employer] ([employer] Others Participate in Exercise     Loyal Gambler. Rosana Hoes, PhD, [place] [email or web address] [place]     [phone]     A comprehensive fitness program for adults.  The program paris senior-level undergraduates Kinesiology students with adults who desire to learn how to exercise safely.  Includes a structural exercise class focusing on functional fitnesss     $100/semester in fall and spring; $75 in summer (no trainers)    *Silver Sneakers is covered by some Personal assistant and includes a  Radio producer at participating locations.  Find out more by calling [phone] or visiting [email or web address]  For additional health and human services resources for senior adults, please contact SeniorLine at [phone] in [place] and [place] at [phone] in all other areas.

## 2017-12-15 NOTE — Therapy (Addendum)
[facility] [address] [place], [place], [address] Phone: [phone]   Fax:  [phone]  Physical Therapy Treatment  Patient Details  Name: Lauren Santana MRN: [id number] Date of Birth: March 09, 1951 Referring Provider: Dr. Criss Rosales   Encounter Date: 12/15/2017  PT End of Session - 12/15/17 1039    Visit Number  12    Number of Visits  15 15    Date for PT Re-Evaluation  01/05/18    Authorization Type  Health team Medicare?    PT Start Time  1019    PT Stop Time  1059    PT Time Calculation (min)  40 min    Equipment Utilized During Treatment  -- S prn    Activity Tolerance  Patient tolerated treatment well    Behavior During Therapy  WFL for tasks assessed/performed       Past Medical History:  Diagnosis Date  . Arthritis   . Edema    B/LLE  . High cholesterol   . Hypertension   . Numbness    B/LLE  . Wears glasses     Past Surgical History:  Procedure Laterality Date  . ABDOMINAL HYSTERECTOMY     and BSO  . [place]  . COLONOSCOPY    . HERNIA REPAIR  2001   incisional  . MULTIPLE TOOTH EXTRACTIONS  2011  . TOTAL KNEE ARTHROPLASTY Left 07/17/2014   Procedure: TOTAL KNEE ARTHROPLASTY;  Surgeon: Kerin Salen, MD;  Location: [facility];  Service: Orthopedics;  Laterality: Left;    There were no vitals filed for this visit.  Subjective Assessment - 12/15/17 1022    Subjective  Pt denied falls or changes since last visit.     Pertinent History  HTN, Hx of L TKA, hx of RLE DVT/PE, dyslipidemia, B cataracts, B knee arthritis (waiting for R TKA), endometriosis, wears glasses    Patient Stated Goals  "I want to be able to feel confident taking my time when walking slow." "I don't want to take baby steps any more." She would like to get back to exercising.     Currently in Pain?  Yes    Pain Score  -- unable to rate today    Pain Location  -- diffuse pain    Pain Orientation  -- diffuse  pain    Pain Descriptors / Indicators  Aching    Pain Type  Chronic pain    Pain Onset  More than a month ago    Pain Frequency  Constant    Aggravating Factors   moving, standing    Pain Relieving Factors  rest                      [facility] Adult PT Treatment/Exercise - 12/15/17 1027      Ambulation/Gait   Ambulation/Gait  Yes    Ambulation/Gait Assistance  6: Modified independent (Device/Increase time);5: Supervision    Ambulation/Gait Assistance Details  Cues to stay within rollator and to improve upright posture. Pt required rest breaks 2/2 R knee pain.    Ambulation Distance (Feet)  117 Feet 75'x2, 150'x3    Assistive device  Rollator    Gait Pattern  Step-through pattern;Decreased stride length;Trunk flexed;Decreased hip/knee flexion - right;Decreased weight shift to right    Ambulation Surface  Level;Indoor      High Level Balance   High Level Balance Activities  Side stepping;Backward walking;Marching forwards    High  Level Balance Comments  Performed in // bars with S for safety and cues for technique. 4-6/x/week      Knee/Hip Exercises: Aerobic   Nustep  Level 3 resistance for 3 minutes with all 4 extremities to improve endurance and strength.              PT Education - 12/15/17 1218    Education provided  Yes    Education Details  PT provided pt with community resources for balance/fall prevention/community exercise programs per pt request.     Person(s) Educated  Patient    Methods  Explanation;Handout    Comprehension  Verbalized understanding       PT Short Term Goals - 10/06/17 1254      PT SHORT TERM GOAL #1   Title  same as LTGs        PT Long Term Goals - 12/04/17 1151      PT LONG TERM GOAL #1   Title  Pt will be IND in HEP to improve strength, endurance, and balance. TARGET DATE FOR ALL LTGS: 12/04/17 (all unmet goals will be carried over to new POC: 12/25/17)    Status  Not Met      PT LONG TERM GOAL #2   Title  Perform BERG  and write goal as indicated.     Status  Achieved      PT LONG TERM GOAL #3   Title  Pt will perform TUG with LRAD in </=13.5 sec. to decr. falls risk.     Status  Achieved      PT LONG TERM GOAL #4   Title  Pt will improve gait speed to >/=2.92f/sec. with LRAD , to safely amb. in the community.     Status  Partially Met      PT LONG TERM GOAL #5   Title  Pt will amb. 600' over even/uneven terrain with LRAD, at MOD I level, to improve functional mobility.     Status  On-going      PT LONG TERM GOAL #6   Title  Pt will improve BERG score to >/=47/56 to decr. falls risk.     Baseline  43/56 on 11/06/17, so updated.    Status  Revised            Plan - 12/15/17 1040    Clinical Impression Statement  Pt continues to demonstrate progress, as she was able to amb. longer distances without rest breaks today. Pt continues to require 1-2 UE support during balance activities for safety. Pt required seated rest breaks 2/2 R knee pain vs. fatigue today. Pt would continue to benefit from skilled PT to improve safety during functional mobility.     Rehab Potential  Good    Clinical Impairments Affecting Rehab Potential  see above    PT Frequency  2x / week requesting add'l 2x/week for 4 weeks    PT Duration  4 weeks pt limited by co-pay    PT Treatment/Interventions  ADLs/Self Care Home Management;Biofeedback;Electrical Stimulation;Therapeutic exercise;Manual techniques;Therapeutic activities;Moist Heat;Functional mobility training;Orthotic Fit/Training;Stair training;Gait training;Patient/family education;DME Instruction;Neuromuscular re-education;Balance training;Vestibular    PT Next Visit Plan  Conitnue Gait with rollator and SPC over even/uneven terrain. High level balance.        Patient will benefit from skilled therapeutic intervention in order to improve the following deficits and impairments:  Abnormal gait, Decreased endurance, Decreased strength, Pain, Impaired flexibility,  Postural dysfunction, Decreased safety awareness, Decreased balance, Decreased mobility, Impaired sensation  Visit Diagnosis:  Other abnormalities of gait and mobility  Unsteadiness on feet  Muscle weakness (generalized)     Problem List Patient Active Problem List   Diagnosis Date Noted  . Tachycardia   . Acute deep vein thrombosis (DVT) of right lower extremity ([facility]) 02/14/2016  . Pulmonary emboli ([place] [facility]) 02/13/2016  . PE (pulmonary embolism) 02/13/2016  . AKI (acute kidney injury) ([facility]) 02/13/2016  . Pulmonary embolism with acute cor pulmonale ([place])   . Pelvic mass in female   . Morbid obesity with BMI of 40.0-44.9, adult ([facility]) 08/20/2015  . 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 Essential hypertension, benign 07/23/2014  . Edema 07/23/2014  . Dyslipidemia 07/23/2014  . Lower extremity numbness 07/23/2014  . 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 Postmenopausal bleeding 03/10/2012  . Endometriosis of pelvis 01/22/2007    Isais Klipfel L 12/15/2017, 12:19 PM  [facility] [address] [place], [place], [address] Phone: [phone]   Fax:  [phone]  Name: Lauren Santana MRN: [id number] Date of Birth: [birth year]  Geoffry Paradise, PT,DPT 12/15/17 12:19 PM Phone: [phone] Fax: [phone]

## 2017-12-18 ENCOUNTER — Ambulatory Visit: Payer: PPO

## 2017-12-18 DIAGNOSIS — R2689 Other abnormalities of gait and mobility: Secondary | ICD-10-CM | POA: Diagnosis not present

## 2017-12-18 DIAGNOSIS — R2681 Unsteadiness on feet: Secondary | ICD-10-CM

## 2017-12-18 DIAGNOSIS — M6281 Muscle weakness (generalized): Secondary | ICD-10-CM

## 2017-12-18 NOTE — Therapy (Signed)
[facility] [address] [place], [place], [address] Phone: [phone]   Fax:  [phone]  Physical Therapy Treatment  Patient Details  Name: Lauren Santana MRN: [id number] Date of Birth: 1951/10/10 Referring Provider: Dr. Criss Rosales   Encounter Date: 12/18/2017  PT End of Session - 12/18/17 1155    Visit Number  13    Number of Visits  15    Date for PT Re-Evaluation  01/05/18    Authorization Type  Health team Medicare?    PT Start Time  1101    PT Stop Time  1141    PT Time Calculation (min)  40 min    Equipment Utilized During Treatment  -- S prn    Activity Tolerance  Patient tolerated treatment well    Behavior During Therapy  WFL for tasks assessed/performed       Past Medical History:  Diagnosis Date  . Arthritis   . Edema    B/LLE  . High cholesterol   . Hypertension   . Numbness    B/LLE  . Wears glasses     Past Surgical History:  Procedure Laterality Date  . ABDOMINAL HYSTERECTOMY     and BSO  . [place]  . COLONOSCOPY    . HERNIA REPAIR  2001   incisional  . MULTIPLE TOOTH EXTRACTIONS  2011  . TOTAL KNEE ARTHROPLASTY Left 07/17/2014   Procedure: TOTAL KNEE ARTHROPLASTY;  Surgeon: Kerin [facility]en, MD;  Location: [facility];  Service: Orthopedics;  Laterality: Left;    There were no vitals filed for this visit.  Subjective Assessment - 12/18/17 1107    Subjective  Pt reported she got a gym membership this week [facility]). Pt denied falls.     Pertinent History  HTN, Hx of L TKA, hx of RLE DVT/PE, dyslipidemia, B cataracts, B knee arthritis (waiting for R TKA), endometriosis, wears glasses    Patient Stated Goals  "I want to be able to feel confident taking my time when walking slow." "I don't want to take baby steps any more." She would like to get back to exercising.     Currently in Pain?  Yes    Pain Score  -- unable to rate    Pain Location  -- diffuse pain    Pain  Descriptors / Indicators  Aching    Pain Type  Chronic pain    Pain Onset  More than a month ago    Pain Frequency  Constant    Aggravating Factors   moving, standing    Pain Relieving Factors  rest                           Balance Exercises - 12/18/17 1108      OTAGO PROGRAM   Head Movements  Standing;5 reps    Neck Movements  Standing;5 reps    Back Extension  Standing;5 reps    Trunk Movements  Standing;5 reps    Ankle Movements  Sitting;10 reps    Knee Extensor  20 reps with red band    Knee Flexor  10 reps sitting    Hip ABductor  10 reps sitting    Ankle Plantorflexors  20 reps, support    Ankle Dorsiflexors  20 reps, support    Knee Bends  10 reps, no support    Backwards Walking  Support    Sideways Walking  Assistive device  Tandem Stance  10 seconds, support    One Leg Stand  10 seconds, support    Sit to Stand  20 reps, no support    Overall OTAGO Comments  Pt required rest breaks 2/2 LE pain but tolerated exercises well with minimal cues.        Self Care: PT Education - 12/18/17 1153    Education provided  Yes    Education Details  Pt reported she joined the [employer] and PT educated pt on proper exercise classes Paramedic) and walking program. PT provided minimal cues during pt's OTAGO HEP, as pt wanted to review exercises to ensure she is performing correctly prior to d/c.     Person(s) Educated  Patient    Methods  Explanation;Verbal cues    Comprehension  Verbalized understanding;Returned demonstration       PT Short Term Goals - 10/06/17 1254      PT SHORT TERM GOAL #1   Title  same as LTGs        PT Long Term Goals - 12/04/17 1151      PT LONG TERM GOAL #1   Title  Pt will be IND in HEP to improve strength, endurance, and balance. TARGET DATE FOR ALL LTGS: 12/04/17 (all unmet goals will be carried over to new POC: 12/25/17)    Status  Not Met      PT LONG TERM GOAL #2   Title  Perform BERG and write goal as indicated.      Status  Achieved      PT LONG TERM GOAL #3   Title  Pt will perform TUG with LRAD in </=13.5 sec. to decr. falls risk.     Status  Achieved      PT LONG TERM GOAL #4   Title  Pt will improve gait speed to >/=2.21f/sec. with LRAD , to safely amb. in the community.     Status  Partially Met      PT LONG TERM GOAL #5   Title  Pt will amb. 600' over even/uneven terrain with LRAD, at MOD I level, to improve functional mobility.     Status  On-going      PT LONG TERM GOAL #6   Title  Pt will improve BERG score to >/=47/56 to decr. falls risk.     Baseline  43/56 on 11/06/17, so updated.    Status  Revised            Plan - 12/18/17 1155    Clinical Impression Statement  Pt demonstrated progress, as she was able to perform ROM activities in standing vs. seated position. Pt's B quad strength is improving, as she progressed to using red theraband during LAQs. Pt continues to require seated rest breaks 2/2 LE pain. Continue with POC.     Rehab Potential  Good    Clinical Impairments Affecting Rehab Potential  see above    PT Frequency  2x / week requesting add'l 2x/week for 4 weeks    PT Duration  4 weeks pt limited by co-pay    PT Treatment/Interventions  ADLs/Self Care Home Management;Biofeedback;Electrical Stimulation;Therapeutic exercise;Manual techniques;Therapeutic activities;Moist Heat;Functional mobility training;Orthotic Fit/Training;Stair training;Gait training;Patient/family education;DME Instruction;Neuromuscular re-education;Balance training;Vestibular    PT Next Visit Plan  Conitnue Gait with rollator and SPC over even/uneven terrain. High level balance. Begin to assess STGs       Patient will benefit from skilled therapeutic intervention in order to improve the following deficits and impairments:  Abnormal gait,  Decreased endurance, Decreased strength, Pain, Impaired flexibility, Postural dysfunction, Decreased safety awareness, Decreased balance, Decreased mobility,  Impaired sensation  Visit Diagnosis: Unsteadiness on feet  Other abnormalities of gait and mobility  Muscle weakness (generalized)     Problem List Patient Active Problem List   Diagnosis Date Noted  . Tachycardia   . Acute deep vein thrombosis (DVT) of right lower extremity ([facility]) 02/14/2016  . Pulmonary emboli ([facility]) 02/13/2016  . PE (pulmonary embolism) 02/13/2016  . AKI (acute kidney injury) ([facility]) 02/13/2016  . Pulmonary embolism with acute cor pulmonale ([facility])   . Pelvic mass in female   . Morbid obesity with BMI of 40.0-44.9, adult ([facility]) 08/20/2015  . 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 Essential hypertension, benign 07/23/2014  . Edema 07/23/2014  . Dyslipidemia 07/23/2014  . Lower extremity numbness 07/23/2014  . 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 Postmenopausal bleeding 03/10/2012  . Endometriosis of pelvis 01/22/2007    Lynnea Vandervoort L 12/18/2017, 11:57 AM  [facility] [address] [place], [place], [address] Phone: [phone]   Fax:  [phone]  Name: Lauren Santana MRN: [id number] Date of Birth: [birth year]  Geoffry Paradise, PT,DPT 12/18/17 12:01 PM Phone: [phone] Fax: [phone]

## 2017-12-22 ENCOUNTER — Ambulatory Visit: Payer: PPO

## 2017-12-22 DIAGNOSIS — R2689 Other abnormalities of gait and mobility: Secondary | ICD-10-CM

## 2017-12-22 DIAGNOSIS — R2681 Unsteadiness on feet: Secondary | ICD-10-CM

## 2017-12-22 DIAGNOSIS — M6281 Muscle weakness (generalized): Secondary | ICD-10-CM

## 2017-12-22 NOTE — Therapy (Signed)
[facility] [address] [place], [place], [address] Phone: [phone]   Fax:  [phone]  Physical Therapy Treatment  Patient Details  Name: Lauren Santana MRN: [id number] Date of Birth: [birth year] Referring Provider: Dr. Criss Rosales   Encounter Date: 12/22/2017  PT End of Session - 12/22/17 1201    Visit Number  14    Number of Visits  15    Date for PT Re-Evaluation  01/05/18    Authorization Type  Health team Medicare?    PT Start Time  1102    PT Stop Time  1142    PT Time Calculation (min)  40 min    Equipment Utilized During Treatment  Gait belt    Activity Tolerance  Patient tolerated treatment well    Behavior During Therapy  WFL for tasks assessed/performed       Past Medical History:  Diagnosis Date  . Arthritis   . Edema    B/LLE  . High cholesterol   . Hypertension   . Numbness    B/LLE  . Wears glasses     Past Surgical History:  Procedure Laterality Date  . ABDOMINAL HYSTERECTOMY     and BSO  . [place]  . COLONOSCOPY    . HERNIA REPAIR  2001   incisional  . MULTIPLE TOOTH EXTRACTIONS  2011  . TOTAL KNEE ARTHROPLASTY Left 07/17/2014   Procedure: TOTAL KNEE ARTHROPLASTY;  Surgeon: Kerin Salen, MD;  Location: [facility];  Service: Orthopedics;  Laterality: Left;    There were no vitals filed for this visit.  Subjective Assessment - 12/22/17 1106    Subjective  Pt denied falls or changes since last visit.     Pertinent History  HTN, Hx of L TKA, hx of RLE DVT/PE, dyslipidemia, B cataracts, B knee arthritis (waiting for R TKA), endometriosis, wears glasses    Patient Stated Goals  "I want to be able to feel confident taking my time when walking slow." "I don't want to take baby steps any more." She would like to get back to exercising.     Currently in Pain?  Yes    Pain Score  -- unable to rate    Pain Location  -- diffuse pain    Pain Descriptors / Indicators   Aching    Pain Type  Chronic pain    Pain Onset  More than a month ago    Pain Frequency  Constant    Aggravating Factors   moving, standing    Pain Relieving Factors  rest                      [facility] Adult PT Treatment/Exercise - 12/22/17 1108      Ambulation/Gait   Ambulation/Gait  Yes    Ambulation/Gait Assistance  6: Modified independent (Device/Increase time);5: Supervision    Ambulation/Gait Assistance Details  Pt noted to amb. with incr. gait deviations when not using AD. Pt required wall and furniture support for rest breaks 2/2 R knee pain when amb. without AD.    Ambulation Distance (Feet)  75 Feet 60'x6    Assistive device  Rollator;Straight cane;None    Gait Pattern  Step-through pattern;Decreased stride length;Trunk flexed;Decreased hip/knee flexion - right;Decreased weight shift to right    Ambulation Surface  Level;Indoor    Gait velocity  2.26f/sec and 2.431fsec. with rollator, 1.9454fec. and 2.5f51fc with SPC, 1.7ft/39f. no AD  Standardized Balance Assessment   Standardized Balance Assessment  Berg Balance Test;Timed Up and Go Test      Berg Balance Test   Sit to Stand  Able to stand without using hands and stabilize independently    Standing Unsupported  Able to stand safely 2 minutes    Sitting with Back Unsupported but Feet Supported on Floor or Stool  Able to sit safely and securely 2 minutes    Stand to Sit  Sits safely with minimal use of hands    Transfers  Able to transfer safely, minor use of hands    Standing Unsupported with Eyes Closed  Able to stand 10 seconds safely    Standing Ubsupported with Feet Together  Able to place feet together independently and stand 1 minute safely    From Standing, Reach Forward with Outstretched Arm  Can reach confidently >25 cm (10") LUE    From Standing Position, Pick up Object from Floor  Able to pick up shoe safely and easily    From Standing Position, Turn to Look Behind Over each Shoulder  Looks  behind one side only/other side shows less weight shift    Turn 360 Degrees  Able to turn 360 degrees safely in 4 seconds or less    Standing Unsupported, Alternately Place Feet on Step/Stool  Able to stand independently and safely and complete 8 steps in 20 seconds    Standing Unsupported, One Foot in Front  Able to plae foot ahead of the other independently and hold 30 seconds    Standing on One Leg  Able to lift leg independently and hold 5-10 seconds    Total Score  53      Timed Up and Go Test   TUG  Normal TUG    Normal TUG (seconds)  16.24 no AD and 13.49 sec. with rollator, no LOB             PT Education - 12/22/17 1157    Education provided  Yes    Education Details  PT discussed goal progress, and encouraged pt to bring in new [facility] to next session to ensure SPC is adjusted  to proper height. PT aware that next visit is her last visit due to excellent progress.    Person(s) Educated  Patient    Methods  Explanation    Comprehension  Verbalized understanding       PT Short Term Goals - 10/06/17 1254      PT SHORT TERM GOAL #1   Title  same as LTGs        PT Long Term Goals - 12/22/17 1205      PT LONG TERM GOAL #1   Title  Pt will be IND in HEP to improve strength, endurance, and balance. TARGET DATE FOR ALL LTGS: 12/04/17 (all unmet goals will be carried over to new POC: 12/25/17)    Status  On-going      PT LONG TERM GOAL #2   Title  Perform BERG and write goal as indicated.     Status  Achieved      PT LONG TERM GOAL #3   Title  Pt will perform TUG with LRAD in </=13.5 sec. to decr. falls risk.     Status  Achieved      PT LONG TERM GOAL #4   Title  Pt will improve gait speed to >/=2.72f/sec. with LRAD , to safely amb. in the community.     Status  Partially  Met      PT LONG TERM GOAL #5   Title  Pt will amb. 600' over even/uneven terrain with LRAD, at MOD I level, to improve functional mobility.     Status  On-going      PT LONG TERM GOAL #6    Title  Pt will improve BERG score to >/=47/56 to decr. falls risk.     Status  Achieved            Plan - 12/22/17 1202    Clinical Impression Statement  Pt demonstrated progress as she met LTGs 3 and 6. Pt partially met LTG 4. PT will finish assessing LTGs 1 and 5 next session and d/c pt based on progress.     Rehab Potential  Good    Clinical Impairments Affecting Rehab Potential  see above    PT Frequency  2x / week requesting add'l 2x/week for 4 weeks    PT Duration  4 weeks pt limited by co-pay    PT Treatment/Interventions  ADLs/Self Care Home Management;Biofeedback;Electrical Stimulation;Therapeutic exercise;Manual techniques;Therapeutic activities;Moist Heat;Functional mobility training;Orthotic Fit/Training;Stair training;Gait training;Patient/family education;DME Instruction;Neuromuscular re-education;Balance training;Vestibular    PT Next Visit Plan  Assess LTG 1 and 5 and d/c.       Patient will benefit from skilled therapeutic intervention in order to improve the following deficits and impairments:  Abnormal gait, Decreased endurance, Decreased strength, Pain, Impaired flexibility, Postural dysfunction, Decreased safety awareness, Decreased balance, Decreased mobility, Impaired sensation  Visit Diagnosis: Other abnormalities of gait and mobility  Unsteadiness on feet  Muscle weakness (generalized)     Problem List Patient Active Problem List   Diagnosis Date Noted  . Tachycardia   . Acute deep vein thrombosis (DVT) of right lower extremity ([facility]) 02/14/2016  . Pulmonary emboli ([place]) 02/13/2016  . PE (pulmonary embolism) 02/13/2016  . AKI (acute kidney injury) ([facility]) 02/13/2016  . Pulmonary embolism with acute cor pulmonale ([place])   . Pelvic mass in female   . Morbid obesity with BMI of 40.0-44.9, adult ([place]) 08/20/2015  . 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 Essential hypertension, benign 07/23/2014  . Edema 07/23/2014  . Dyslipidemia 07/23/2014  .  Lower extremity numbness 07/23/2014  . 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 Postmenopausal bleeding 03/10/2012  . Endometriosis of pelvis 01/22/2007    Shaneece Stockburger L 12/22/2017, 12:07 PM  [facility] [address] [place], [place], [address] Phone: [phone]   Fax:  [phone]  Name: Lauren Santana MRN: [id number] Date of Birth: Aug 12, 1951  Geoffry Paradise, PT,DPT 12/22/17 12:07 PM Phone: [phone] Fax: [phone]

## 2017-12-23 DIAGNOSIS — I10 Essential (primary) hypertension: Secondary | ICD-10-CM | POA: Diagnosis not present

## 2017-12-23 DIAGNOSIS — K219 Gastro-esophageal reflux disease without esophagitis: Secondary | ICD-10-CM | POA: Diagnosis not present

## 2017-12-25 ENCOUNTER — Ambulatory Visit: Payer: PPO | Attending: Family Medicine

## 2017-12-25 DIAGNOSIS — M6281 Muscle weakness (generalized): Secondary | ICD-10-CM | POA: Insufficient documentation

## 2017-12-25 DIAGNOSIS — R2681 Unsteadiness on feet: Secondary | ICD-10-CM | POA: Insufficient documentation

## 2017-12-25 DIAGNOSIS — R2689 Other abnormalities of gait and mobility: Secondary | ICD-10-CM | POA: Insufficient documentation

## 2017-12-25 NOTE — Therapy (Signed)
[facility] [address] [place], [place], [address] Phone: [phone]   Fax:  [phone]  Physical Therapy Treatment  Patient Details  Name: Lauren Santana MRN: [id number] Date of Birth: [birth year] Referring Provider: Dr. Criss Rosales   Encounter Date: 12/25/2017  PT End of Session - 12/25/17 1153    Visit Number  15    Number of Visits  15    Date for PT Re-Evaluation  01/05/18    Authorization Type  Health team Medicare?    PT Start Time  1105    PT Stop Time  1123 d/c    PT Time Calculation (min)  18 min    Activity Tolerance  Patient tolerated treatment well    Behavior During Therapy  WFL for tasks assessed/performed       Past Medical History:  Diagnosis Date  . Arthritis   . Edema    B/LLE  . High cholesterol   . Hypertension   . Numbness    B/LLE  . Wears glasses     Past Surgical History:  Procedure Laterality Date  . ABDOMINAL HYSTERECTOMY     and BSO  . [place]  . COLONOSCOPY    . HERNIA REPAIR  2001   incisional  . MULTIPLE TOOTH EXTRACTIONS  2011  . TOTAL KNEE ARTHROPLASTY Left 07/17/2014   Procedure: TOTAL KNEE ARTHROPLASTY;  Surgeon: Kerin Salen, MD;  Location: [facility];  Service: Orthopedics;  Laterality: Left;    There were no vitals filed for this visit.  Subjective Assessment - 12/25/17 1108    Subjective  Pt denied falls or changes since last visit.     Pertinent History  HTN, Hx of L TKA, hx of RLE DVT/PE, dyslipidemia, B cataracts, B knee arthritis (waiting for R TKA), endometriosis, wears glasses    Patient Stated Goals  "I want to be able to feel confident taking my time when walking slow." "I don't want to take baby steps any more." She would like to get back to exercising.     Currently in Pain?  Yes    Pain Score  -- diffuse pain    Pain Location  -- diffuse pain    Pain Orientation  -- diffuse pain    Pain Descriptors / Indicators  Aching    Pain  Type  Chronic pain    Pain Onset  More than a month ago    Pain Frequency  Constant    Aggravating Factors   moving, standing    Pain Relieving Factors  rest                      [facility] Adult PT Treatment/Exercise - 12/25/17 1110      Ambulation/Gait   Ambulation/Gait  Yes    Ambulation/Gait Assistance  6: Modified independent (Device/Increase time)    Ambulation/Gait Assistance Details  Pt amb. with rollator over even/uneven terrain with no LOB, pt did require several standing rest breaks 2/2 pain.    Ambulation Distance (Feet)  600 Feet    Assistive device  Rollator    Gait Pattern  Step-through pattern;Decreased stride length;Trunk flexed;Decreased hip/knee flexion - right    Ambulation Surface  Level;Unlevel;Indoor;Outdoor;Paved    Gait Comments  PT adjusted pt's new SPC to correct height, it is adjusted slighty higher than pt's wrist to promote upright posture.             Self Care: PT Education -  12/25/17 1145    Education provided  Yes    Education Details  PT discussed gym schedule after d/c from PT, pt agreed to at least 2x/week (Tues and Thurs) as she's going to [employer] on Fridays. PT also encouraged pt to perform HEP as prescribed 1-2 times a week, since she will be going to the gym.     Person(s) Educated  Patient    Methods  Explanation    Comprehension  Verbalized understanding       PT Short Term Goals - 10/06/17 1254      PT SHORT TERM GOAL #1   Title  same as LTGs        PT Long Term Goals - 12/25/17 1154      PT LONG TERM GOAL #1   Title  Pt will be IND in HEP to improve strength, endurance, and balance. TARGET DATE FOR ALL LTGS: 12/04/17 (all unmet goals will be carried over to new POC: 12/25/17)    Status  Partially Met      PT LONG TERM GOAL #2   Title  Perform BERG and write goal as indicated.     Status  Achieved      PT LONG TERM GOAL #3   Title  Pt will perform TUG with LRAD in </=13.5 sec. to decr. falls risk.      Status  Achieved      PT LONG TERM GOAL #4   Title  Pt will improve gait speed to >/=2.88f/sec. with LRAD , to safely amb. in the community.     Status  Partially Met      PT LONG TERM GOAL #5   Title  Pt will amb. 600' over even/uneven terrain with LRAD, at MOD I level, to improve functional mobility.     Status  Achieved      PT LONG TERM GOAL #6   Title  Pt will improve BERG score to >/=47/56 to decr. falls risk.     Status  Achieved            Plan - 12/25/17 1153    Clinical Impression Statement  Pt met LTG 5 and partially met LTG 1, as she required cues for technique last week during review of HEP. Please see pt summary for d/c information.     Rehab Potential  Good    Clinical Impairments Affecting Rehab Potential  see above    PT Frequency  2x / week requesting add'l 2x/week for 4 weeks    PT Duration  4 weeks pt limited by co-pay    PT Treatment/Interventions  ADLs/Self Care Home Management;Biofeedback;Electrical Stimulation;Therapeutic exercise;Manual techniques;Therapeutic activities;Moist Heat;Functional mobility training;Orthotic Fit/Training;Stair training;Gait training;Patient/family education;DME Instruction;Neuromuscular re-education;Balance training;Vestibular    PT Next Visit Plan  d/c       Patient will benefit from skilled therapeutic intervention in order to improve the following deficits and impairments:  Abnormal gait, Decreased endurance, Decreased strength, Pain, Impaired flexibility, Postural dysfunction, Decreased safety awareness, Decreased balance, Decreased mobility, Impaired sensation  Visit Diagnosis: Other abnormalities of gait and mobility  Unsteadiness on feet  Muscle weakness (generalized)     Problem List Patient Active Problem List   Diagnosis Date Noted  . Tachycardia   . Acute deep vein thrombosis (DVT) of right lower extremity ([facility] 02/14/2016  . Pulmonary emboli ([facility] 02/13/2016  . PE (pulmonary embolism) 02/13/2016  . AKI  (acute kidney injury) ([facility] 02/13/2016  . Pulmonary embolism with acute cor pulmonale ([facility]   .  Pelvic mass in female   . Morbid obesity with BMI of 40.0-44.9, adult ([place]) 08/20/2015  . 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 Essential hypertension, benign 07/23/2014  . Edema 07/23/2014  . Dyslipidemia 07/23/2014  . Lower extremity numbness 07/23/2014  . 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 Postmenopausal bleeding 03/10/2012  . Endometriosis of pelvis 01/22/2007    Annebelle Bostic L 12/25/2017, 11:56 AM  [facility] [address] [place], [place], [address] Phone: [phone]   Fax:  [phone]  Name: Lauren Santana MRN: [id number] Date of Birth: 08-14-1951  PHYSICAL THERAPY DISCHARGE SUMMARY  Visits from Start of Care: 15  Current functional level related to goals / functional outcomes: PT Long Term Goals - 12/25/17 1154      PT LONG TERM GOAL #1   Title  Pt will be IND in HEP to improve strength, endurance, and balance. TARGET DATE FOR ALL LTGS: 12/04/17 (all unmet goals will be carried over to new POC: 12/25/17)    Status  Partially Met      PT LONG TERM GOAL #2   Title  Perform BERG and write goal as indicated.     Status  Achieved      PT LONG TERM GOAL #3   Title  Pt will perform TUG with LRAD in </=13.5 sec. to decr. falls risk.     Status  Achieved      PT LONG TERM GOAL #4   Title  Pt will improve gait speed to >/=2.48f/sec. with LRAD , to safely amb. in the community.     Status  Partially Met      PT LONG TERM GOAL #5   Title  Pt will amb. 600' over even/uneven terrain with LRAD, at MOD I level, to improve functional mobility.     Status  Achieved      PT LONG TERM GOAL #6   Title  Pt will improve BERG score to >/=47/56 to decr. falls risk.     Status  Achieved         Remaining deficits: Decreased endurance and pt is the process of obtaining new rollator.    Education /  Equipment: HEP, pt purchased SPC.  Plan: Patient agrees to discharge.  Patient goals were met. Patient is being discharged due to meeting the stated rehab goals.  ?????         JGeoffry Paradise PT,DPT 12/25/17 12:37 PM Phone: [phone]: [phone]

## 2018-01-28 DIAGNOSIS — K219 Gastro-esophageal reflux disease without esophagitis: Secondary | ICD-10-CM | POA: Diagnosis not present

## 2018-01-28 DIAGNOSIS — I10 Essential (primary) hypertension: Secondary | ICD-10-CM | POA: Diagnosis not present

## 2018-01-28 DIAGNOSIS — E669 Obesity, unspecified: Secondary | ICD-10-CM | POA: Diagnosis not present

## 2018-03-01 DIAGNOSIS — R269 Unspecified abnormalities of gait and mobility: Secondary | ICD-10-CM | POA: Diagnosis not present

## 2018-03-02 DIAGNOSIS — E785 Hyperlipidemia, unspecified: Secondary | ICD-10-CM | POA: Diagnosis not present

## 2018-03-02 DIAGNOSIS — E7800 Pure hypercholesterolemia, unspecified: Secondary | ICD-10-CM | POA: Diagnosis not present

## 2018-03-02 DIAGNOSIS — E876 Hypokalemia: Secondary | ICD-10-CM | POA: Diagnosis not present

## 2018-03-02 DIAGNOSIS — I10 Essential (primary) hypertension: Secondary | ICD-10-CM | POA: Diagnosis not present

## 2018-03-02 DIAGNOSIS — R609 Edema, unspecified: Secondary | ICD-10-CM | POA: Diagnosis not present

## 2018-03-22 DIAGNOSIS — I10 Essential (primary) hypertension: Secondary | ICD-10-CM | POA: Diagnosis not present

## 2018-03-22 DIAGNOSIS — E7800 Pure hypercholesterolemia, unspecified: Secondary | ICD-10-CM | POA: Diagnosis not present

## 2018-03-22 DIAGNOSIS — E876 Hypokalemia: Secondary | ICD-10-CM | POA: Diagnosis not present

## 2018-03-23 DIAGNOSIS — E785 Hyperlipidemia, unspecified: Secondary | ICD-10-CM | POA: Diagnosis not present

## 2018-03-23 DIAGNOSIS — I10 Essential (primary) hypertension: Secondary | ICD-10-CM | POA: Diagnosis not present

## 2018-03-23 DIAGNOSIS — E876 Hypokalemia: Secondary | ICD-10-CM | POA: Diagnosis not present

## 2018-04-05 ENCOUNTER — Ambulatory Visit: Payer: PPO | Admitting: Podiatry

## 2018-04-05 DIAGNOSIS — R609 Edema, unspecified: Secondary | ICD-10-CM

## 2018-04-05 DIAGNOSIS — R2681 Unsteadiness on feet: Secondary | ICD-10-CM | POA: Diagnosis not present

## 2018-04-07 NOTE — Progress Notes (Signed)
Subjective:   Patient ID: Lauren Santana, female   DOB: 67 y.o.   MRN: [id number]   HPI 67 year old female presents the office today to discuss shoes to her feet.  She also discussed compression socks and she wants a prescription for compression socks given chronic swelling to both of her legs.  This is been ongoing for some time.  She has no pain to her feet her legs.  She just wants to build to walk better she was to wear more supportive shoe and she wants to discuss this.  Denies any falls.  She has been going to physical therapy.  She has no other concerns today.  No recent injury.   Review of Systems  All other systems reviewed and are negative.  Past Medical History:  Diagnosis Date  . Arthritis   . Edema    B/LLE  . High cholesterol   . Hypertension   . Numbness    B/LLE  . Wears glasses     Past Surgical History:  Procedure Laterality Date  . ABDOMINAL HYSTERECTOMY     and BSO  . [place]  . COLONOSCOPY    . HERNIA REPAIR  2001   incisional  . MULTIPLE TOOTH EXTRACTIONS  2011  . TOTAL KNEE ARTHROPLASTY Left 07/17/2014   Procedure: TOTAL KNEE ARTHROPLASTY;  Surgeon: Kerin Salen, MD;  Location: [facility];  Service: Orthopedics;  Laterality: Left;     Current Outpatient Medications:  .  atorvastatin (LIPITOR) 10 MG tablet, TAKE ONE TABLET BY MOUTH  ONCE DAILY IN THE EVENING, Disp: 30 tablet, Rfl: 0 .  diphenhydrAMINE (BENADRYL) 25 MG tablet, Take 25 mg by mouth every 6 (six) hours as needed for itching or allergies., Disp: , Rfl:  .  furosemide (LASIX) 20 MG tablet, Take 1 tablet (20 mg total) by mouth daily., Disp: 30 tablet, Rfl: 0 .  gabapentin (NEURONTIN) 300 MG capsule, Take 300 mg by mouth 3 (three) times daily., Disp: , Rfl:  .  indapamide (LOZOL) 2.5 MG tablet, Take 2.5 mg by mouth daily., Disp: , Rfl:  .  KLOR-CON M10 10 MEQ tablet, TAKE ONE TABLET BY MOUTH ONCE DAILY (Patient taking differently: TAKE 10 MEQ BY MOUTH ONCE DAILY), Disp: 30  tablet, Rfl: 0 .  labetalol (NORMODYNE) 100 MG tablet, Take 100 mg by mouth 2 (two) times daily., Disp: , Rfl:  .  lisinopril (PRINIVIL,ZESTRIL) 10 MG tablet, Take 1 tablet (10 mg total) by mouth at bedtime. RESUME ONLY AFTER SEEN BY YOUR PCP AND IF BLOOD PRESSURE IS ELEVATED (Patient not taking: Reported on 09/08/2017), Disp: , Rfl:  .  norethindrone (CAMILA) 0.35 MG tablet, Take 1 tablet daily by mouth., Disp: , Rfl:  .  Rivaroxaban (XARELTO STARTER PACK) 15 & 20 MG TBPK, Take as directed on package: Start with one 15mg  tablet by mouth twice a day with food. On Day 22, switch to one 20mg  tablet once a day with food. (Patient not taking: Reported on 09/08/2017), Disp: 51 each, Rfl: 0 .  rivaroxaban (XARELTO) 20 MG TABS tablet, Take 1 tablet (20 mg total) by mouth daily with supper. START ONLY AFTER YOU HAVE FINISHED ALL THE TABLETS IN THE STARTER PACK (Patient not taking: Reported on 09/08/2017), Disp: 30 tablet, Rfl: 2 .  traMADol (ULTRAM) 50 MG tablet, Take 50 mg by mouth 2 (two) times daily., Disp: , Rfl: 0  Allergies  Allergen Reactions  . Tylenol [Acetaminophen] Other (See Comments)    States "  seems like eats my stomach"  . Sulfa Antibiotics Other (See Comments)    Unknown allergic reaction          Objective:  Physical Exam  General: AAO x3, NAD  Dermatological: Skin is warm, dry and supple bilateral.  There are no open sores, no preulcerative lesions, no rash or signs of infection present.  Vascular: Dorsalis Pedis artery and Posterior Tibial artery pedal pulses are 2/4 bilateral with immedate capillary fill time. There is no pain with calf compression, swelling, warmth, erythema.  There is mild chronic swelling bilaterally but there is no erythema or any signs of acute DVT.  Neruologic: Grossly intact via light touch bilateral. Vibratory intact via tuning fork bilateral. Protective threshold with Semmes Wienstein monofilament intact to all pedal sites bilateral.    Musculoskeletal: There is no area of tenderness identified bilaterally.  There is a depression medial arch upon weightbearing.  Ankle, subtalar joint range of motion intact.  There is no area pinpoint tenderness or pain to vibratory sensation bilaterally.  Muscular strength 5/5 in all groups tested bilateral.  Gait: Unassisted, Nonantalgic.       Assessment:   Gait instability, swelling  Plan:  -Treatment options discussed including all alternatives, risks, and complications -We discussed various treatment options for her feet.  Also have Liliane Channel evaluate her for potential shoes.  We discussed a more supportive shoe.  We can also consider a Moore balance brace.  She is been doing physical therapy which is been helpful.  I did give her a prescription for compression socks for a light compression.  I will see her back as needed.  Trula Slade DPM

## 2018-04-13 DIAGNOSIS — M1711 Unilateral primary osteoarthritis, right knee: Secondary | ICD-10-CM | POA: Diagnosis not present

## 2018-04-13 DIAGNOSIS — M25571 Pain in right ankle and joints of right foot: Secondary | ICD-10-CM | POA: Diagnosis not present

## 2018-04-27 DIAGNOSIS — I10 Essential (primary) hypertension: Secondary | ICD-10-CM | POA: Diagnosis not present

## 2018-04-27 DIAGNOSIS — M159 Polyosteoarthritis, unspecified: Secondary | ICD-10-CM | POA: Diagnosis not present

## 2018-04-27 DIAGNOSIS — E669 Obesity, unspecified: Secondary | ICD-10-CM | POA: Diagnosis not present

## 2018-06-28 DIAGNOSIS — F064 Anxiety disorder due to known physiological condition: Secondary | ICD-10-CM | POA: Diagnosis not present

## 2018-06-28 DIAGNOSIS — E876 Hypokalemia: Secondary | ICD-10-CM | POA: Diagnosis not present

## 2018-06-28 DIAGNOSIS — Z0000 Encounter for general adult medical examination without abnormal findings: Secondary | ICD-10-CM | POA: Diagnosis not present

## 2018-06-28 DIAGNOSIS — M154 Erosive (osteo)arthritis: Secondary | ICD-10-CM | POA: Diagnosis not present

## 2018-06-28 DIAGNOSIS — G9009 Other idiopathic peripheral autonomic neuropathy: Secondary | ICD-10-CM | POA: Diagnosis not present

## 2018-06-28 DIAGNOSIS — Z6841 Body Mass Index (BMI) 40.0 and over, adult: Secondary | ICD-10-CM | POA: Diagnosis not present

## 2018-06-28 DIAGNOSIS — E669 Obesity, unspecified: Secondary | ICD-10-CM | POA: Diagnosis not present

## 2018-06-28 DIAGNOSIS — I10 Essential (primary) hypertension: Secondary | ICD-10-CM | POA: Diagnosis not present

## 2018-08-17 DIAGNOSIS — Z6841 Body Mass Index (BMI) 40.0 and over, adult: Secondary | ICD-10-CM | POA: Diagnosis not present

## 2018-08-17 DIAGNOSIS — E669 Obesity, unspecified: Secondary | ICD-10-CM | POA: Diagnosis not present

## 2018-08-17 DIAGNOSIS — F064 Anxiety disorder due to known physiological condition: Secondary | ICD-10-CM | POA: Diagnosis not present

## 2018-08-17 DIAGNOSIS — I10 Essential (primary) hypertension: Secondary | ICD-10-CM | POA: Diagnosis not present

## 2018-09-28 DIAGNOSIS — M25562 Pain in left knee: Secondary | ICD-10-CM | POA: Diagnosis not present

## 2018-09-28 DIAGNOSIS — M25561 Pain in right knee: Secondary | ICD-10-CM | POA: Diagnosis not present

## 2018-10-24 HISTORY — PX: CATARACT EXTRACTION: SUR2

## 2018-11-04 DIAGNOSIS — H25812 Combined forms of age-related cataract, left eye: Secondary | ICD-10-CM | POA: Diagnosis not present

## 2018-11-04 DIAGNOSIS — H2512 Age-related nuclear cataract, left eye: Secondary | ICD-10-CM | POA: Diagnosis not present

## 2018-11-04 DIAGNOSIS — H25042 Posterior subcapsular polar age-related cataract, left eye: Secondary | ICD-10-CM | POA: Diagnosis not present

## 2018-11-22 DIAGNOSIS — E785 Hyperlipidemia, unspecified: Secondary | ICD-10-CM | POA: Diagnosis not present

## 2018-11-22 DIAGNOSIS — E669 Obesity, unspecified: Secondary | ICD-10-CM | POA: Diagnosis not present

## 2018-11-22 DIAGNOSIS — E7800 Pure hypercholesterolemia, unspecified: Secondary | ICD-10-CM | POA: Diagnosis not present

## 2018-11-22 DIAGNOSIS — N804 Endometriosis of rectovaginal septum and vagina: Secondary | ICD-10-CM | POA: Diagnosis not present

## 2018-11-22 DIAGNOSIS — Z6841 Body Mass Index (BMI) 40.0 and over, adult: Secondary | ICD-10-CM | POA: Diagnosis not present

## 2018-11-22 DIAGNOSIS — K219 Gastro-esophageal reflux disease without esophagitis: Secondary | ICD-10-CM | POA: Diagnosis not present

## 2018-11-22 DIAGNOSIS — I10 Essential (primary) hypertension: Secondary | ICD-10-CM | POA: Diagnosis not present

## 2018-12-29 DIAGNOSIS — R202 Paresthesia of skin: Secondary | ICD-10-CM | POA: Diagnosis not present

## 2018-12-29 DIAGNOSIS — Z6841 Body Mass Index (BMI) 40.0 and over, adult: Secondary | ICD-10-CM | POA: Diagnosis not present

## 2018-12-29 DIAGNOSIS — F064 Anxiety disorder due to known physiological condition: Secondary | ICD-10-CM | POA: Diagnosis not present

## 2018-12-29 DIAGNOSIS — I10 Essential (primary) hypertension: Secondary | ICD-10-CM | POA: Diagnosis not present

## 2019-01-03 ENCOUNTER — Ambulatory Visit (INDEPENDENT_AMBULATORY_CARE_PROVIDER_SITE_OTHER): Payer: PPO | Admitting: Neurology

## 2019-01-03 ENCOUNTER — Encounter: Payer: Self-pay | Admitting: *Deleted

## 2019-01-03 ENCOUNTER — Encounter: Payer: Self-pay | Admitting: Neurology

## 2019-01-03 VITALS — BP 125/66 | HR 72 | Ht 61.0 in | Wt 230.0 lb

## 2019-01-03 DIAGNOSIS — H5711 Ocular pain, right eye: Secondary | ICD-10-CM

## 2019-01-03 DIAGNOSIS — M316 Other giant cell arteritis: Secondary | ICD-10-CM | POA: Diagnosis not present

## 2019-01-03 DIAGNOSIS — R200 Anesthesia of skin: Secondary | ICD-10-CM | POA: Diagnosis not present

## 2019-01-03 NOTE — Patient Instructions (Signed)
     Trigeminal Neuralgia  Trigeminal neuralgia is a nerve disorder that causes attacks of severe facial pain. The attacks last from a few seconds to several minutes. They can happen for days, weeks, or months and then go away for months or years. Trigeminal neuralgia is also called tic douloureux. What are the causes? This condition is caused by damage to a nerve in the face that is called the trigeminal nerve. An attack can be triggered by:  Talking.  Chewing.  Putting on makeup.  Washing your face.  Shaving your face.  Brushing your teeth.  Touching your face. What increases the risk? This condition is more likely to develop in:  Women.  People who are 53 years of age or older. What are the signs or symptoms? The main symptom of this condition is pain in the jaw, lips, eyes, nose, scalp, forehead, and face. The pain may be intense, stabbing, electric, or shock-like. How is this diagnosed? This condition is diagnosed with a physical exam. A CT scan or MRI may be done to rule out other conditions that can cause facial pain. How is this treated? This condition may be treated with:  Avoiding the things that trigger your attacks.  Pain medicine.  Surgery. This may be done in severe cases if other medical treatment does not provide relief. Follow these instructions at home:  Take over-the-counter and prescription medicines only as told by your health care provider.  If you wish to get pregnant, talk with your health care provider before you start trying to get pregnant.  Avoid the things that trigger your attacks. It may help to: ? Chew on the unaffected side of your mouth. ? Avoid touching your face. ? Avoid blasts of hot or cold air. Contact a health care provider if:  Your pain medicine is not helping.  You develop new, unexplained symptoms, such as: ? Double vision. ? Facial weakness. ? Changes in hearing or balance.  You become pregnant. Get help right  away if:  Your pain is unbearable, and your pain medicine does not help. This information is not intended to replace advice given to you by your health care provider. Make sure you discuss any questions you have with your health care provider. Document Released: 11/07/2000 Document Revised: 10/09/2017 Document Reviewed: 03/05/2015 Elsevier Interactive Patient Education  2019 [employer].

## 2019-01-03 NOTE — Progress Notes (Addendum)
[id number]    Provider:  Dr Jaynee Eagles Referring Provider: Lucianne Lei, MD Primary Care Provider:  Lucianne Lei, MD  CC:  Numbness or right side of ear and face.   Addendum: ESR 90.I had a very low suspicion of temporal arteritis/Giant Cell Arteritis(GCA) when I saw her but checked sed rate to be sure - and her sed rate came back at 90 significantly elevated. Her symptoms do not include vision changes but she does report feeling like something is in her eye. Her other symptoms don't fit GCA either such as numbness in the face and pain in front of the ear(not in the temple and no headache per se). She did not endorse any jaw claudication or polymyalgia rheumatica symptoms either. Very unclear why she would have Sed rate at 22. But given the risks I started her on steroids and she has improved, will order temporal artery biopsy. She will have to follow closely with Dr. Criss Rosales to ensure her glucose does not spiral.  HPI:  Lauren Santana is a 68 y.o. female here as requested by provider Lucianne Lei, MD for paresthesias of skin.  Past medical history prediabetes (hemoglobin A1c 6.2), arthritis, lower extremity edema bilaterally, high cholesterol, hypertension, numbness in the bilateral lower extremities.  She is on Xarelto.  She also takes gabapentin and tramadol. Here with son who also providesmuch information. 3 months ago started with something in her eye, and whatever it was went down to the right lower cheek. She used a hair pin in her ear because it was itchy and she developed an ear ache. Last Thursday she started feeling something in her ear and in front of her ear. Also lip and right chin felt ike numbness. Son says she has some anxiety about stroke. She still has some numbness and tingling on the right lower face. Lyrica helps. No weakness, no dofficulty swallowing, no pain. Her jaw and gums feel numb. Radiates dow tp the jaw. No vision changes. No jaw claudication. No rash. No  lesions. No other focal neurologic deficits, associated symptoms, inciting events or modifiable factors.  Reviewed notes, labs and imaging from outside physicians, which showed:  Reviewed notes.  Patient was seen in podiatry in May 2019 by Dr. Paulino Door.  They discussed compression socks given chronic swelling to both feet.  Swelling has been ongoing for some time.  She has no pain to her feet her legs.  She just wants to be able to walk better and wear more supportive shoes.  Denies any falls.  She has been going to physical therapy.  No recent injury.  Pulses were normal distally in the lower extremities, no pain with calf compression, there was swelling chronically but no erythema or acute signs of DVT.  Neurologically she is intact light touch bilaterally, vibration intact via tuning fork bilaterally, intact with monofilament to pedal sites bilaterally.  Reviewed notes from referring physician Dr. Criss Rosales.  She is a non-smoker, nondrinker.  Per notes at [facility] clinic, she has full range of motion of the eyebrows of the eye opening and shutting lifting of the eyebrows, moving her tongue, no deficit in the nerve at this time.  She was started on Lyrica 75 mg twice a day and sent for neurology consult.  I reviewed her labs which include LDL of 57, BUN 11 creatinine 0.94 labs were drawn November 22, 2018, CMP and CBC were normal, elevated hemoglobin A1c at 6.2  Review of Systems: Patient complains of symptoms per HPI as  well as the following symptoms: numbness. Pertinent negatives and positives per HPI. All others negative.   Social History   Socioeconomic History  . Marital status: Divorced    Spouse name: Not on file  . Number of children: 2  . Years of education: Not on file  . Highest education level: High school graduate  Occupational History  . Not on file  Social Needs  . Financial resource strain: Not on file  . Food insecurity:    Worry: Not on file    Inability: Not on file  .  Transportation needs:    Medical: Not on file    Non-medical: Not on file  Tobacco Use  . Smoking status: Never Smoker  . Smokeless tobacco: Never Used  Substance and Sexual Activity  . Alcohol use: No  . Drug use: No  . Sexual activity: Not Currently  Lifestyle  . Physical activity:    Days per week: Not on file    Minutes per session: Not on file  . Stress: Not on file  Relationships  . Social connections:    Talks on phone: Not on file    Gets together: Not on file    Attends religious service: Not on file    Active member of club or organization: Not on file    Attends meetings of clubs or organizations: Not on file    Relationship status: Not on file  . Intimate partner violence:    Fear of current or ex partner: Not on file    Emotionally abused: Not on file    Physically abused: Not on file    Forced sexual activity: Not on file  Other Topics Concern  . Not on file  Social History Narrative   Lives alone    Right handed    Family History  Problem Relation Age of Onset  . Colon cancer Sister   . Cancer Sister        unsure of type of cancer    Past Medical History:  Diagnosis Date  . Arthritis   . Edema    B/LLE  . High cholesterol   . Hypertension   . Numbness    B/LLE  . Wears glasses     Patient Active Problem List   Diagnosis Date Noted  . Tachycardia   . Acute deep vein thrombosis (DVT) of right lower extremity ([place]) 02/14/2016  . Pulmonary emboli ([place]) 02/13/2016  . PE (pulmonary embolism) 02/13/2016  . AKI (acute kidney injury) ([place]) 02/13/2016  . Pulmonary embolism with acute cor pulmonale ([place])   . Pelvic mass in female   . Morbid obesity with BMI of 40.0-44.9, adult ([place]) 08/20/2015  . 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 Essential hypertension, benign 07/23/2014  . Edema 07/23/2014  . Dyslipidemia 07/23/2014  . Lower extremity numbness 07/23/2014  . 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 Postmenopausal bleeding 03/10/2012  .  Endometriosis of pelvis 01/22/2007    Past Surgical History:  Procedure Laterality Date  . ABDOMINAL HYSTERECTOMY     and BSO  . CATARACT EXTRACTION Left 10/2018  . [place]  . COLONOSCOPY    . HERNIA REPAIR  2001   incisional  . MULTIPLE TOOTH EXTRACTIONS  2011  . neck mass removal     "fatty tissue, not cancerous" per pt's son  . TOTAL KNEE ARTHROPLASTY Left 07/17/2014   Procedure: TOTAL KNEE ARTHROPLASTY;  Surgeon: Kerin Salen, MD;  Location: [place];  Service:  Orthopedics;  Laterality: Left;    Current Outpatient Medications  Medication Sig Dispense Refill  . atorvastatin (LIPITOR) 10 MG tablet TAKE ONE TABLET BY MOUTH  ONCE DAILY IN THE EVENING 30 tablet 0  . diphenhydrAMINE (BENADRYL) 25 MG tablet Take 25 mg by mouth every 6 (six) hours as needed for itching or allergies.    . furosemide (LASIX) 20 MG tablet Take 1 tablet (20 mg total) by mouth daily. 30 tablet 0  . gabapentin (NEURONTIN) 300 MG capsule Take 300 mg by mouth 3 (three) times daily.    . indapamide (LOZOL) 2.5 MG tablet Take 2.5 mg by mouth daily.    Marland Kitchen KLOR-CON M10 10 MEQ tablet TAKE ONE TABLET BY MOUTH ONCE DAILY (Patient taking differently: TAKE 10 MEQ BY MOUTH ONCE DAILY) 30 tablet 0  . labetalol (NORMODYNE) 100 MG tablet Take 100 mg by mouth 2 (two) times daily.    . norethindrone (CAMILA) 0.35 MG tablet Take 1 tablet by mouth daily.    Marland Kitchen omeprazole (PRILOSEC) 20 MG capsule Take 20 mg by mouth daily.    . pregabalin (LYRICA) 75 MG capsule Take 75 mg by mouth 2 (two) times daily.    . traMADol (ULTRAM) 50 MG tablet Take 50 mg by mouth 2 (two) times daily.  0  . lisinopril (PRINIVIL,ZESTRIL) 10 MG tablet Take 1 tablet (10 mg total) by mouth at bedtime. RESUME ONLY AFTER SEEN BY YOUR PCP AND IF BLOOD PRESSURE IS ELEVATED (Patient not taking: Reported on 09/08/2017)    . Rivaroxaban (XARELTO STARTER PACK) 15 & 20 MG TBPK Take as directed on package: Start with one 87m tablet by mouth twice a  day with food. On Day 22, switch to one 280mtablet once a day with food. (Patient not taking: Reported on 09/08/2017) 51 each 0  . rivaroxaban (XARELTO) 20 MG TABS tablet Take 1 tablet (20 mg total) by mouth daily with supper. START ONLY AFTER YOU HAVE FINISHED ALL THE TABLETS IN THE STARTER PACK (Patient not taking: Reported on 09/08/2017) 30 tablet 2   No current facility-administered medications for this visit.     Allergies as of 01/03/2019 - Review Complete 01/03/2019  Allergen Reaction Noted  . Tylenol [acetaminophen] Other (See Comments) 08/24/2015  . Sulfa antibiotics Other (See Comments) 03/03/2012  . Sulfasalazine Other (See Comments) 03/03/2012    Vitals: BP 125/66 (BP Location: Right Arm, Patient Position: Sitting)   Pulse 72   Ht '5\' 1"'  (1.549 m)   Wt 230 lb (104.3 kg)   BMI 43.46 kg/m  Last Weight:  Wt Readings from Last 1 Encounters:  01/03/19 230 lb (104.3 kg)   Last Height:   Ht Readings from Last 1 Encounters:  01/03/19 '5\' 1"'  (1.549 m)     Physical exam: Exam: Gen: NAD, conversant, well nourised, obese, well groomed                     CV: RRR, no MRG. No Carotid Bruits. +peripheral edema, warm, nontender Eyes: Conjunctivae clear without exudates or hemorrhage  Neuro: Detailed Neurologic Exam  Speech:    Speech is normal; fluent and spontaneous with normal comprehension.  Cognition:    The patient is oriented to person, place, and time;     recent and remote memory intact;     language fluent;     normal attention, concentration,     fund of knowledge Cranial Nerves:    The pupils are equal, round, and reactive to light.  Attempted findoscopy could not visualizw due to small pupils. . Visual fields are full to finger confrontation. Extraocular movements are intact. Trigeminal sensation is intact and the muscles of mastication are normal. The face is symmetric. The palate elevates in the midline. Hearing intact. Voice is normal. Shoulder shrug is  normal. The tongue has normal motion without fasciculations.   Coordination:    No dysmetroa  Gait:    Wide based with a walker  Motor Observation:    No asymmetry, no atrophy, and no involuntary movements noted. Tone:    Normal muscle tone.    Posture:    Slightly stooped    Strength: Prox LE weakness. Otherwise strength is V/V in the upper and lower limbs.      Sensation: intact to LT     Reflex Exam:  DTR's:    Deep tendon reflexes in the upper are normal bilaterally, absent in the lowers ay be due to swelling Toes:    The toes are equiv bilaterally.   Clonus:    Clonus is absent.    Assessment/Plan:  Lauren Santana is a 68 y.o. female here as requested by provider Lucianne Lei, MD for paresthesias of skin.  Past medical history prediabetes (hemoglobin A1c 6.2), arthritis, lower extremity edema bilaterally, high cholesterol, hypertension, numbness in the bilateral lower extremities.  She is on Xarelto   Addendum: ESR 90.I had a very low suspicion of temporal arteritis/Giant Cell Arteritis(GCA) when I saw her but checked sed rate to be sure - and her sed rate came back at 90 significantly elevated. Her symptoms do not include vision changes but she does report feeling like something is in her eye. Her other symptoms don't fit GCA either such as numbness in the face and pain in front of the ear(not in the temple and no headache per se). She did not endorse any jaw claudication or polymyalgia rheumatica symptoms either. Very unclear why she would have Sed rate at 40. But given the risks I started her on steroids and she has improved, will order temporal artery biopsy. She will have to follow closely with Dr. Criss Rosales to ensure her glucose does not spiral.  Orders Placed This Encounter  Procedures  . MR BRAIN W WO CONTRAST  . Basic Metabolic Panel  . Sedimentation rate  . C-reactive protein  . Ambulatory referral to General Surgery   Meds ordered this encounter    Medications  . predniSONE (DELTASONE) 20 MG tablet    Sig: Take 2 tablets (40 mg total) by mouth daily with breakfast.    Dispense:  60 tablet    Refill:  0     She has numbness on there with side if the face, in the distribution of the trigeminal nerve. May have caused damage by using a hair pin (a lot) to itch her ear on that side. Still need MRI of the brain w/wo contrast to evaluate for stroke or other lesions that can cause irritation to CNV or right facial numbness. 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   Cc: Lucianne Lei, MD,    Sarina Ill, MD  [facility] [address] [place], [place] [address]  Phone [phone] Fax [phone]

## 2019-01-04 ENCOUNTER — Telehealth: Payer: Self-pay | Admitting: Neurology

## 2019-01-04 LAB — BASIC METABOLIC PANEL
BUN/Creatinine Ratio: 9 — ABNORMAL LOW (ref 12–28)
BUN: 8 mg/dL (ref 8–27)
CO2: 27 mmol/L (ref 20–29)
Calcium: 9.6 mg/dL (ref 8.7–10.3)
Chloride: 95 mmol/L — ABNORMAL LOW (ref 96–106)
Creatinine, Ser: 0.89 mg/dL (ref 0.57–1.00)
GFR calc Af Amer: 76 mL/min/{1.73_m2} (ref >59)
GFR calc non Af Amer: 66 mL/min/{1.73_m2} (ref >59)
Glucose: 90 mg/dL (ref 65–99)
Potassium: 2.8 mmol/L — ABNORMAL LOW (ref 3.5–5.2)
Sodium: 142 mmol/L (ref 134–144)

## 2019-01-04 LAB — SEDIMENTATION RATE: Sed Rate: 90 mm/hr — ABNORMAL HIGH (ref 0–40)

## 2019-01-04 LAB — C-REACTIVE PROTEIN: CRP: 6 mg/L (ref 0–10)

## 2019-01-04 NOTE — Telephone Encounter (Signed)
health team order sent to GI. No auth they will reach out to the pt to schedule.  °

## 2019-01-04 NOTE — Telephone Encounter (Signed)
lvm for pt to be aware I left [facility] phone number of [phone] and to give them a call if she has not heard from them in the next 2-3 business days.

## 2019-01-06 ENCOUNTER — Telehealth: Payer: Self-pay | Admitting: Neurology

## 2019-01-06 ENCOUNTER — Other Ambulatory Visit: Payer: Self-pay | Admitting: Neurology

## 2019-01-06 DIAGNOSIS — I10 Essential (primary) hypertension: Secondary | ICD-10-CM | POA: Diagnosis not present

## 2019-01-06 DIAGNOSIS — R202 Paresthesia of skin: Secondary | ICD-10-CM | POA: Diagnosis not present

## 2019-01-06 MED ORDER — PREDNISONE 20 MG PO TABS: 60.0000 mg | ORAL_TABLET | Freq: Every day | ORAL | 0 refills | Status: DC

## 2019-01-06 NOTE — Telephone Encounter (Signed)
I called patient yesterday and today and left messages fr her to call us back. I had a very low suspicion of temporal arteritis/Giant Cell Arteritis(GCA) when I saw her but checked sed rate to be sure - and her sed rate came back at 90 significantly elevated. Her symptoms do not include vision changes but she does report feeling like something is in her eye. Her other symptoms don't fit GCA either such as numbness in the face and pain in front of the ear(not in the temple and no headache per se). She did not endorse any jaw claudication or polymyalgia rheumatica symptoms either. Very unclear why she would have Sed rate at 57. But given the risks I am going to start her on some steroids and see how she does, if this is GCA she should have a dramatic response and if so we will order a nerve biopsy.   She will have to follow closely with Dr. Criss Rosales to ensure her glucose does not spiral and I will cc her on this message.

## 2019-01-06 NOTE — Telephone Encounter (Signed)
I spoke to son, we can try a burst of sterids for 5 days and see what happens but she will have to watch her sugar very carefully. Patient is meeting with Dr. Criss Rosales today and will get her opinion as well.

## 2019-01-09 ENCOUNTER — Telehealth: Payer: Self-pay | Admitting: Neurology

## 2019-01-09 NOTE — Telephone Encounter (Signed)
Call patient about esr

## 2019-01-10 ENCOUNTER — Telehealth: Payer: Self-pay | Admitting: Neurology

## 2019-01-10 MED ORDER — PREDNISONE 20 MG PO TABS: 40.0000 mg | ORAL_TABLET | Freq: Every day | ORAL | 0 refills | Status: DC

## 2019-01-10 NOTE — Telephone Encounter (Signed)
Spoke with pt's son Lauren Santana and discussed that Dr. Jaynee Eagles is going to continue Lauren Santana on Prednisone 40 mg (two 20 mg tablets) every morning with food until we can get her a temporal artery biopsy. In the meantime, Dr. Jaynee Eagles left a message with Dr. Criss Rosales and asked for a return call. I advised Lauren Santana that the patient needs to watch her glucose as the steroids can increase the levels and she needs to watch for dark stools or upset stomach, any signs of intestinal bleeding. His questions were answered. He is aware that Dr. Jaynee Eagles has placed a referral to general surgery for the biopsy and he will receive a separate call from their office to schedule. He was encouraged to call back with any questions. He verbalized understanding and appreciation.

## 2019-01-10 NOTE — Telephone Encounter (Signed)
Spoke to son, she is feeling better on the steroids. Calling Dr. Criss Rosales to get her opinion, atypical presentation for temporal arteritis but ESR 90. Left a message, she can call me back at [phone]. I will order a temporal artery biopsy. Will continue steroids.

## 2019-01-10 NOTE — Telephone Encounter (Signed)
Please call son, I will continue patient on 40mg  steroids in the morning with food until we can get her a temporal artery biopsy. In the meantime I left a message with Dr. Criss Rosales and asked her to call me back. Patient really needs to watch her glucose and watch for dark stools or stomach upset.

## 2019-01-10 NOTE — Addendum Note (Signed)
Addended by: Sarina Ill B on: 01/10/2019 12:33 PM   Modules accepted: Orders

## 2019-01-14 ENCOUNTER — Ambulatory Visit
Admission: RE | Admit: 2019-01-14 | Discharge: 2019-01-14 | Disposition: A | Discharge status: Home / Self Care | Payer: PPO | Source: Ambulatory Visit | Attending: Neurology | Admitting: Neurology

## 2019-01-14 DIAGNOSIS — R200 Anesthesia of skin: Secondary | ICD-10-CM

## 2019-01-14 DIAGNOSIS — H5711 Ocular pain, right eye: Secondary | ICD-10-CM

## 2019-01-14 MED ORDER — GADOBENATE DIMEGLUMINE 529 MG/ML IV SOLN
20.0000 mL | Freq: Once | INTRAVENOUS | Status: AC | PRN
  Administered 2019-01-14: 20 mL via INTRAVENOUS

## 2019-01-19 ENCOUNTER — Telehealth: Payer: Self-pay | Admitting: Neurology

## 2019-01-19 NOTE — Telephone Encounter (Signed)
MRI of the brain showed nothing acute, no reason seen for patient's headache.  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  This is chronic not acute.  Please inquire how patient's headache is.  Her ESR was 90 and I asked them to follow-up with Dr. Criss Rosales as they were seeing her.  We gave her 5 days of high-dose steroids, if the headache improved but now is worse we may need to get a biopsy and continue steroids.  Patient son was supposed to call me back after they saw Dr. Criss Rosales but I have not received a call.  Please inquire and let me know.

## 2019-01-20 NOTE — Telephone Encounter (Signed)
I spoke with [name]. He stated that the patient is still taking prednisone 40 mg daily. She has "not been having those sensations." He reports she is not giving him any indication that anything is wrong and she has greatly improved. He asked if the ESR needed to be repeated. He said the consult for temporal artery biopsy is next week. I advised him to have pt continue prednisone until otherwise noted and I will be in touch with Dr. Jaynee Eagles and call him back.

## 2019-01-21 ENCOUNTER — Telehealth: Payer: Self-pay | Admitting: Neurology

## 2019-01-21 NOTE — Telephone Encounter (Signed)
Spoke with pt's son [name]. He stated the pt wanted to go over the MRI results with Dr. Jaynee Eagles in person. I offered Tuesday 01/25/2019 @ 11:30 arrival 15 minutes prior and he accepted.

## 2019-01-21 NOTE — Telephone Encounter (Signed)
Appointment Request From: [name]    With Provider: Melvenia Beam, MD [[facility]]    Preferred Date Range: 01/24/2019 - 02/04/2019    Preferred Times: Any time    Reason for visit: Request an Appointment    Comments:  To discuss MRI.

## 2019-01-22 NOTE — Telephone Encounter (Signed)
I spoke to Lauren Santana, patient wants to talk to me before the biopsy she is hesitant to get it. I encouraged the consult with the surgeon, I hate to keep her on long-term steroids if she does not have GCA but we will discuss further next week they have an appointment with me on Tuesday.  thanks.

## 2019-01-25 ENCOUNTER — Encounter: Payer: Self-pay | Admitting: Neurology

## 2019-01-25 ENCOUNTER — Ambulatory Visit (INDEPENDENT_AMBULATORY_CARE_PROVIDER_SITE_OTHER): Payer: PPO | Admitting: Neurology

## 2019-01-25 VITALS — BP 131/65 | HR 69 | Ht 61.0 in | Wt 238.0 lb

## 2019-01-25 DIAGNOSIS — R700 Elevated erythrocyte sedimentation rate: Secondary | ICD-10-CM | POA: Diagnosis not present

## 2019-01-25 MED ORDER — PREDNISONE 5 MG PO TABS: ORAL_TABLET | ORAL | 0 refills | Status: DC

## 2019-01-25 NOTE — Progress Notes (Signed)
[facility]    Provider:  Dr Jaynee Eagles Referring Provider: Lucianne Lei, MD Primary Care Provider:  Lucianne Lei, MD  CC:  Numbness or right side of ear and face.   Interval history 01/25/2019: I discussed the plan with general surgery for biopsy but patient wanted to come in and talk about it anyway.    This MRI of the brain with and without contrast shows the following: 1.   There are multiple single and confluent T2/flair hyperintense foci in the subcortical and deep white matter and one small focus in the pons most consistent with moderate chronic microvascular ischemic change.  None of these foci appear to be acute. 2.   There is a normal enhancement pattern and there are no acute findings.  Addendum: ESR 90.I had a very low suspicion of temporal arteritis/Giant Cell Arteritis(GCA) when I saw her but checked sed rate to be sure - and her sed rate came back at 90 significantly elevated. Her symptoms do not include vision changes but she does report feeling like something is in her eye. Her other symptoms don't fit GCA either such as numbness in the face and pain in front of the ear(not in the temple and no headache per se). She did not endorse any jaw claudication or polymyalgia rheumatica symptoms either. Very unclear why she would have Sed rate at 65. But given the risks I started her on steroids and she has improved, will order temporal artery biopsy. She will have to follow closely with Dr. Criss Rosales to ensure her glucose does not spiral.  HPI:  Lauren Santana is a 68 y.o. female here as requested by provider Lucianne Lei, MD for paresthesias of skin.  Past medical history prediabetes (hemoglobin A1c 6.2), arthritis, lower extremity edema bilaterally, high cholesterol, hypertension, numbness in the bilateral lower extremities.  She is on Xarelto.  She also takes gabapentin and tramadol. Here with son who also providesmuch information. 3 months ago started with something in  her eye, and whatever it was went down to the right lower cheek. She used a hair pin in her ear because it was itchy and she developed an ear ache. Last Thursday she started feeling something in her ear and in front of her ear. Also lip and right chin felt ike numbness. Son says she has some anxiety about stroke. She still has some numbness and tingling on the right lower face. Lyrica helps. No weakness, no dofficulty swallowing, no pain. Her jaw and gums feel numb. Radiates dow tp the jaw. No vision changes. No jaw claudication. No rash. No lesions. No other focal neurologic deficits, associated symptoms, inciting events or modifiable factors.  Reviewed notes, labs and imaging from outside physicians, which showed:  Reviewed notes.  Patient was seen in podiatry in May 2019 by Dr. Paulino Door.  They discussed compression socks given chronic swelling to both feet.  Swelling has been ongoing for some time.  She has no pain to her feet her legs.  She just wants to be able to walk better and wear more supportive shoes.  Denies any falls.  She has been going to physical therapy.  No recent injury.  Pulses were normal distally in the lower extremities, no pain with calf compression, there was swelling chronically but no erythema or acute signs of DVT.  Neurologically she is intact light touch bilaterally, vibration intact via tuning fork bilaterally, intact with monofilament to pedal sites bilaterally.  Reviewed notes from referring physician Dr. Criss Rosales.  She  is a non-smoker, nondrinker.  Per notes at [facility] clinic, she has full range of motion of the eyebrows of the eye opening and shutting lifting of the eyebrows, moving her tongue, no deficit in the nerve at this time.  She was started on Lyrica 75 mg twice a day and sent for neurology consult.  I reviewed her labs which include LDL of 57, BUN 11 creatinine 0.94 labs were drawn November 22, 2018, CMP and CBC were normal, elevated hemoglobin A1c at 6.2  Review of  Systems: Patient complains of symptoms per HPI as well as the following symptoms: numbness. Pertinent negatives and positives per HPI. All others negative.   Social History   Socioeconomic History  . Marital status: Divorced    Spouse name: Not on file  . Number of children: 2  . Years of education: Not on file  . Highest education level: High school graduate  Occupational History  . Not on file  Social Needs  . Financial resource strain: Not on file  . Food insecurity:    Worry: Not on file    Inability: Not on file  . Transportation needs:    Medical: Not on file    Non-medical: Not on file  Tobacco Use  . Smoking status: Never Smoker  . Smokeless tobacco: Never Used  Substance and Sexual Activity  . Alcohol use: No  . Drug use: No  . Sexual activity: Not Currently  Lifestyle  . Physical activity:    Days per week: Not on file    Minutes per session: Not on file  . Stress: Not on file  Relationships  . Social connections:    Talks on phone: Not on file    Gets together: Not on file    Attends religious service: Not on file    Active member of club or organization: Not on file    Attends meetings of clubs or organizations: Not on file    Relationship status: Not on file  . Intimate partner violence:    Fear of current or ex partner: Not on file    Emotionally abused: Not on file    Physically abused: Not on file    Forced sexual activity: Not on file  Other Topics Concern  . Not on file  Social History Narrative   Lives alone    Right handed    Family History  Problem Relation Age of Onset  . Colon cancer Sister   . Cancer Sister        unsure of type of cancer    Past Medical History:  Diagnosis Date  . Arthritis   . Edema    B/LLE  . High cholesterol   . Hypertension   . Numbness    B/LLE  . Wears glasses     Patient Active Problem List   Diagnosis Date Noted  . Tachycardia   . Acute deep vein thrombosis (DVT) of right lower extremity  ([place]) 02/14/2016  . Pulmonary emboli ([place]) 02/13/2016  . PE (pulmonary embolism) 02/13/2016  . AKI (acute kidney injury) ([place]) 02/13/2016  . Pulmonary embolism with acute cor pulmonale ([place])   . Pelvic mass in female   . Morbid obesity with BMI of 40.0-44.9, adult ([place]) 08/20/2015  . 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 Essential hypertension, benign 07/23/2014  . Edema 07/23/2014  . Dyslipidemia 07/23/2014  . Lower extremity numbness 07/23/2014  . 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 Postmenopausal bleeding 03/10/2012  . Endometriosis of  pelvis 01/22/2007    Past Surgical History:  Procedure Laterality Date  . ABDOMINAL HYSTERECTOMY     and BSO  . CATARACT EXTRACTION Left 10/2018  . [place]  . COLONOSCOPY    . HERNIA REPAIR  2001   incisional  . MULTIPLE TOOTH EXTRACTIONS  2011  . neck mass removal     "fatty tissue, not cancerous" per pt's son  . TOTAL KNEE ARTHROPLASTY Left 07/17/2014   Procedure: TOTAL KNEE ARTHROPLASTY;  Surgeon: Kerin Salen, MD;  Location: [facility];  Service: Orthopedics;  Laterality: Left;    Current Outpatient Medications  Medication Sig Dispense Refill  . atorvastatin (LIPITOR) 10 MG tablet TAKE ONE TABLET BY MOUTH  ONCE DAILY IN THE EVENING 30 tablet 0  . diphenhydrAMINE (BENADRYL) 25 MG tablet Take 25 mg by mouth every 6 (six) hours as needed for itching or allergies.    . furosemide (LASIX) 20 MG tablet Take 1 tablet (20 mg total) by mouth daily. 30 tablet 0  . gabapentin (NEURONTIN) 300 MG capsule Take 300 mg by mouth 3 (three) times daily.    . indapamide (LOZOL) 2.5 MG tablet Take 2.5 mg by mouth daily.    Marland Kitchen KLOR-CON M10 10 MEQ tablet TAKE ONE TABLET BY MOUTH ONCE DAILY (Patient taking differently: TAKE 10 MEQ BY MOUTH ONCE DAILY) 30 tablet 0  . labetalol (NORMODYNE) 100 MG tablet Take 100 mg by mouth 2 (two) times daily.    . norethindrone (CAMILA) 0.35 MG tablet Take 1 tablet by mouth daily.    Marland Kitchen omeprazole  (PRILOSEC) 20 MG capsule Take 20 mg by mouth daily.    . pregabalin (LYRICA) 75 MG capsule Take 75 mg by mouth 2 (two) times daily.    . traMADol (ULTRAM) 50 MG tablet Take 50 mg by mouth 2 (two) times daily.  0  . lisinopril (PRINIVIL,ZESTRIL) 10 MG tablet Take 1 tablet (10 mg total) by mouth at bedtime. RESUME ONLY AFTER SEEN BY YOUR PCP AND IF BLOOD PRESSURE IS ELEVATED (Patient not taking: Reported on 09/08/2017)    . predniSONE (DELTASONE) 5 MG tablet Titration 51m x 1 week, then 235mfor one week, 1036mor one week, 5mg65mr one week, then stop 91 tablet 0  . Rivaroxaban (XARELTO STARTER PACK) 15 & 20 MG TBPK Take as directed on package: Start with one 15mg18mlet by mouth twice a day with food. On Day 22, switch to one 20mg 11met once a day with food. (Patient not taking: Reported on 09/08/2017) 51 each 0  . rivaroxaban (XARELTO) 20 MG TABS tablet Take 1 tablet (20 mg total) by mouth daily with supper. START ONLY AFTER YOU HAVE FINISHED ALL THE TABLETS IN THE STARTER PACK (Patient not taking: Reported on 09/08/2017) 30 tablet 2   No current facility-administered medications for this visit.     Allergies as of 01/25/2019 - Review Complete 01/25/2019  Allergen Reaction Noted  . Tylenol [acetaminophen] Other (See Comments) 08/24/2015  . Sulfa antibiotics Other (See Comments) 03/03/2012  . Sulfasalazine Other (See Comments) 03/03/2012    Vitals: BP 131/65 (BP Location: Right Arm, Patient Position: Sitting)   Pulse 69   Ht '5\' 1"'  (1.549 m)   Wt 238 lb (108 kg)   BMI 44.97 kg/m  Last Weight:  Wt Readings from Last 1 Encounters:  01/25/19 238 lb (108 kg)   Last Height:   Ht Readings from Last 1 Encounters:  01/25/19 '5\' 1"'  (1.549 m)  Physical exam: Exam: Gen: NAD, conversant, well nourised, obese, well groomed                     CV: RRR, no MRG. No Carotid Bruits. +peripheral edema, warm, nontender Eyes: Conjunctivae clear without exudates or hemorrhage  Neuro: Detailed  Neurologic Exam  Speech:    Speech is normal; fluent and spontaneous with normal comprehension.  Cognition:    The patient is oriented to person, place, and time;     recent and remote memory intact;     language fluent;     normal attention, concentration,     fund of knowledge Cranial Nerves:    The pupils are equal, round, and reactive to light. Attempted findoscopy could not visualizw due to small pupils. . Visual fields are full to finger confrontation. Extraocular movements are intact. Trigeminal sensation is intact and the muscles of mastication are normal. The face is symmetric. The palate elevates in the midline. Hearing intact. Voice is normal. Shoulder shrug is normal. The tongue has normal motion without fasciculations.   Coordination:    No dysmetroa  Gait:    Wide based with a walker  Motor Observation:    No asymmetry, no atrophy, and no involuntary movements noted. Tone:    Normal muscle tone.    Posture:    Slightly stooped    Strength: Prox LE weakness. Otherwise strength is V/V in the upper and lower limbs.      Sensation: intact to LT     Reflex Exam:  DTR's:    Deep tendon reflexes in the upper are normal bilaterally, absent in the lowers ay be due to swelling Toes:    The toes are equiv bilaterally.   Clonus:    Clonus is absent.    Assessment/Plan:  Lauren Santana is a 68 y.o. female here as requested by provider Lucianne Lei, MD for paresthesias of skin.  Past medical history prediabetes (hemoglobin A1c 6.2), arthritis, lower extremity edema bilaterally, high cholesterol, hypertension, numbness in the bilateral lower extremities.  She is on Xarelto  Discussed with patient it is unclear why her sed rate was 90, I had very low suspicion for GCA but checked to make sure. They decline an artery biopsy at this time they would like to titrate off of the steroids and just follow her. Unclear why she had sed rate 90. Will titrate off over 4-5 weeks,  recheck esr in 6 weeks and regularly afterwards.   Steroids have helped with her pain and her paresthesias in the face and the eye symptoms. Need to titrate her off of the steroids.   Addendum: ESR 90.I had a very low suspicion of temporal arteritis/Giant Cell Arteritis(GCA) when I saw her but checked sed rate to be sure - and her sed rate came back at 90 significantly elevated. Her symptoms do not include vision changes but she does report feeling like something is in her eye. Her other symptoms don't fit GCA either such as numbness in the face and pain in front of the ear(not in the temple and no headache per se). She did not endorse any jaw claudication or polymyalgia rheumatica symptoms either. Very unclear why she would have Sed rate at 16. But given the risks I started her on steroids and she has improved, will order temporal artery biopsy. She will have to follow closely with Dr. Criss Rosales to ensure her glucose does not spiral.  Orders Placed This Encounter  Procedures  .  Sedimentation rate   Meds ordered this encounter  Medications  . predniSONE (DELTASONE) 5 MG tablet    Sig: Titration 37m x 1 week, then 21mfor one week, 1071mor one week, 5mg22mr one week, then stop    Dispense:  91 tablet    Refill:  0     She has numbness on there with side if the face, in the distribution of the trigeminal nerve. May have caused damage by using a hair pin (a lot) to itch her ear on that side. Still need MRI of the brain w/wo contrast to evaluate for stroke or other lesions that can cause irritation to CNV or right facial numbness. 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   Cc: BlanLucianne Lei,    AntoSarina Ill  [facility] [address]bye[place] [phone] of 25  minutes was spent face-to-face with this patient. Over half this time was spent on counseling patient on the  1.  Elevated sed rate    diagnosis and different diagnostic and therapeutic options, counseling and coordination of care, risks ans benefits of management, compliance, or risk factor reduction and education.

## 2019-01-25 NOTE — Patient Instructions (Addendum)
Titration 1 week 30mg  Then decrease to 20mg  for one week Then decrease to 10mg  for one week Then decrease to 5mg  for one week And stop  Prednisone tablets What is this medicine? PREDNISONE (PRED ni sone) is a corticosteroid. It is commonly used to treat inflammation of the skin, joints, lungs, and other organs. Common conditions treated include asthma, allergies, and arthritis. It is also used for other conditions, such as blood disorders and diseases of the adrenal glands. This medicine may be used for other purposes; ask your health care provider or pharmacist if you have questions. COMMON BRAND NAME(S): Deltasone, Predone, Sterapred, Sterapred DS What should I tell my health care provider before I take this medicine? They need to know if you have any of these conditions: -Cushing's syndrome -diabetes -glaucoma -heart disease -high blood pressure -infection (especially a virus infection such as chickenpox, cold sores, or herpes) -kidney disease -liver disease -mental illness -myasthenia gravis -osteoporosis -seizures -stomach or intestine problems -thyroid disease -an unusual or allergic reaction to lactose, prednisone, other medicines, foods, dyes, or preservatives -pregnant or trying to get pregnant -breast-feeding How should I use this medicine? Take this medicine by mouth with a glass of water. Follow the directions on the prescription label. Take this medicine with food. If you are taking this medicine once a day, take it in the morning. Do not take more medicine than you are told to take. Do not suddenly stop taking your medicine because you may develop a severe reaction. Your doctor will tell you how much medicine to take. If your doctor wants you to stop the medicine, the dose may be slowly lowered over time to avoid any side effects. Talk to your pediatrician regarding the use of this medicine in children. Special care may be needed. Overdosage: If you think you have  taken too much of this medicine contact a poison control center or emergency room at once. NOTE: This medicine is only for you. Do not share this medicine with others. What if I miss a dose? If you miss a dose, take it as soon as you can. If it is almost time for your next dose, talk to your doctor or health care professional. You may need to miss a dose or take an extra dose. Do not take double or extra doses without advice. What may interact with this medicine? Do not take this medicine with any of the following medications: -metyrapone -mifepristone This medicine may also interact with the following medications: -aminoglutethimide -amphotericin B -aspirin and aspirin-like medicines -barbiturates -certain medicines for diabetes, like glipizide or glyburide -cholestyramine -cholinesterase inhibitors -cyclosporine -digoxin -diuretics -ephedrine -female hormones, like estrogens and birth control pills -isoniazid -ketoconazole -NSAIDS, medicines for pain and inflammation, like ibuprofen or naproxen -phenytoin -rifampin -toxoids -vaccines -warfarin This list may not describe all possible interactions. Give your health care provider a list of all the medicines, herbs, non-prescription drugs, or dietary supplements you use. Also tell them if you smoke, drink alcohol, or use illegal drugs. Some items may interact with your medicine. What should I watch for while using this medicine? Visit your doctor or health care professional for regular checks on your progress. If you are taking this medicine over a prolonged period, carry an identification card with your name and address, the type and dose of your medicine, and your doctor's name and address. This medicine may increase your risk of getting an infection. Tell your doctor or health care professional if you are around anyone with measles or  chickenpox, or if you develop sores or blisters that do not heal properly. If you are going to have  surgery, tell your doctor or health care professional that you have taken this medicine within the last twelve months. Ask your doctor or health care professional about your diet. You may need to lower the amount of salt you eat. This medicine may increase blood sugar. Ask your healthcare provider if changes in diet or medicines are needed if you have diabetes. What side effects may I notice from receiving this medicine? Side effects that you should report to your doctor or health care professional as soon as possible: -allergic reactions like skin rash, itching or hives, swelling of the face, lips, or tongue -changes in emotions or moods -changes in vision -depressed mood -eye pain -fever or chills, cough, sore throat, pain or difficulty passing urine -signs and symptoms of high blood sugar such as being more thirsty or hungry or having to urinate more than normal. You may also feel very tired or have blurry vision. -swelling of ankles, feet Side effects that usually do not require medical attention (report to your doctor or health care professional if they continue or are bothersome): -confusion, excitement, restlessness -headache -nausea, vomiting -skin problems, acne, thin and shiny skin -trouble sleeping -weight gain This list may not describe all possible side effects. Call your doctor for medical advice about side effects. You may report side effects to FDA at 1-800-FDA-1088. Where should I keep my medicine? Keep out of the reach of children. Store at room temperature between 15 and 30 degrees C (59 and 86 degrees F). Protect from light. Keep container tightly closed. Throw away any unused medicine after the expiration date. NOTE: This sheet is a summary. It may not cover all possible information. If you have questions about this medicine, talk to your doctor, pharmacist, or health care provider.  2019 Elsevier/Gold Standard (2018-08-10 10:54:22)

## 2019-01-26 LAB — SEDIMENTATION RATE: Sed Rate: 21 mm/hr (ref 0–40)

## 2019-01-27 ENCOUNTER — Encounter: Payer: Self-pay | Admitting: Neurology

## 2019-01-27 ENCOUNTER — Telehealth: Payer: Self-pay | Admitting: Neurology

## 2019-01-27 NOTE — Telephone Encounter (Signed)
-----   Message from Melvenia Beam, MD sent at 01/26/2019 12:39 PM EST ----- Call son and let him know her sed rate is now normal. We will check again in 6 weeks. thanks

## 2019-01-27 NOTE — Telephone Encounter (Signed)
Called the pt son and lvm advising the lab work appeared normal and advising we would recheck this in about 6 wks. Instructed to call back with any concerns. I will also send a mychart message.

## 2019-02-02 ENCOUNTER — Other Ambulatory Visit: Payer: Self-pay | Admitting: Neurology

## 2019-03-09 ENCOUNTER — Telehealth: Payer: Self-pay | Admitting: Neurology

## 2019-03-09 ENCOUNTER — Other Ambulatory Visit: Payer: Self-pay | Admitting: *Deleted

## 2019-03-09 DIAGNOSIS — R700 Elevated erythrocyte sedimentation rate: Secondary | ICD-10-CM

## 2019-03-09 NOTE — Telephone Encounter (Signed)
Appointment Request From: [name]    With Provider: Melvenia Beam, MD [[facility]]    Preferred Date Range: 03/10/2019 - 03/16/2019    Preferred Times: Any time    Reason for visit: Request an Appointment    Comments:  follow-up because medication has stopped. wanted to get next steps from Dr. Jaynee Eagles.

## 2019-03-09 NOTE — Telephone Encounter (Signed)
Spoke with Dr. Jaynee Eagles. Pt should come by office during lab hours to have another ESR drawn and then we will go from there based on results.

## 2019-03-09 NOTE — Telephone Encounter (Signed)
Called pt's son [name] (on [place]) and LVM (ok per DPR) advising that we received an appt request for pt however Dr. Jaynee Eagles would like pt to have ESR redrawn first then will be in touch with next steps (I.e. possible appt). Advised order placed. [place] lab open Mon-Thurs 8-12 or she can go to local [facility], [facility] Mon-Fri 8-3p or [facility] Mon-Fri 7:30-5p. Asked for call back to let us know where pt will go. Also advised that because of pandemic, we are not doing an office visit but can do telephone call for visit instead. Left office number in message.   Order placed for ESR.

## 2019-03-09 NOTE — Telephone Encounter (Signed)
Noted thanks °

## 2019-03-09 NOTE — Telephone Encounter (Signed)
pts son called in and stated they will arrive at [facility] lab between 8-12

## 2019-03-10 ENCOUNTER — Encounter: Payer: Self-pay | Admitting: Podiatry

## 2019-03-10 ENCOUNTER — Other Ambulatory Visit: Payer: Self-pay | Admitting: Neurology

## 2019-03-10 ENCOUNTER — Ambulatory Visit: Payer: PPO | Admitting: Podiatry

## 2019-03-10 ENCOUNTER — Telehealth: Payer: Self-pay | Admitting: *Deleted

## 2019-03-10 ENCOUNTER — Other Ambulatory Visit: Payer: Self-pay

## 2019-03-10 DIAGNOSIS — R700 Elevated erythrocyte sedimentation rate: Secondary | ICD-10-CM

## 2019-03-10 DIAGNOSIS — R609 Edema, unspecified: Secondary | ICD-10-CM

## 2019-03-10 DIAGNOSIS — M79675 Pain in left toe(s): Secondary | ICD-10-CM | POA: Diagnosis not present

## 2019-03-10 DIAGNOSIS — M79674 Pain in right toe(s): Secondary | ICD-10-CM | POA: Diagnosis not present

## 2019-03-10 DIAGNOSIS — B351 Tinea unguium: Secondary | ICD-10-CM

## 2019-03-10 DIAGNOSIS — Z7901 Long term (current) use of anticoagulants: Secondary | ICD-10-CM

## 2019-03-10 NOTE — Telephone Encounter (Signed)
-----   Message from Trula Slade, DPM sent at 03/10/2019 11:28 AM EDT ----- Can you please order venous reflux study due to swelling b/l? Thanks.

## 2019-03-10 NOTE — Telephone Encounter (Signed)
Required [facility] required form completed and faxed to [facility].

## 2019-03-11 ENCOUNTER — Telehealth: Payer: Self-pay | Admitting: Podiatry

## 2019-03-11 NOTE — Telephone Encounter (Signed)
Pt was seen by Dr. Jacqualyn Posey and she was suppose to go to [facility] to get some customize compression socks. They can't measure her at the facility due to [place]. Does the patient need to come back here and get measured. Please call patient.

## 2019-03-14 ENCOUNTER — Other Ambulatory Visit: Payer: Self-pay

## 2019-03-14 ENCOUNTER — Other Ambulatory Visit (INDEPENDENT_AMBULATORY_CARE_PROVIDER_SITE_OTHER): Payer: Self-pay

## 2019-03-14 DIAGNOSIS — Z0289 Encounter for other administrative examinations: Secondary | ICD-10-CM

## 2019-03-14 DIAGNOSIS — R700 Elevated erythrocyte sedimentation rate: Secondary | ICD-10-CM | POA: Diagnosis not present

## 2019-03-15 ENCOUNTER — Encounter: Payer: Self-pay | Admitting: *Deleted

## 2019-03-15 ENCOUNTER — Telehealth: Payer: Self-pay | Admitting: Neurology

## 2019-03-15 DIAGNOSIS — R609 Edema, unspecified: Secondary | ICD-10-CM | POA: Diagnosis not present

## 2019-03-15 DIAGNOSIS — N804 Endometriosis of rectovaginal septum and vagina: Secondary | ICD-10-CM | POA: Diagnosis not present

## 2019-03-15 DIAGNOSIS — K649 Unspecified hemorrhoids: Secondary | ICD-10-CM | POA: Diagnosis not present

## 2019-03-15 DIAGNOSIS — M25571 Pain in right ankle and joints of right foot: Secondary | ICD-10-CM | POA: Diagnosis not present

## 2019-03-15 DIAGNOSIS — M79604 Pain in right leg: Secondary | ICD-10-CM | POA: Diagnosis not present

## 2019-03-15 DIAGNOSIS — E669 Obesity, unspecified: Secondary | ICD-10-CM | POA: Diagnosis not present

## 2019-03-15 LAB — C-REACTIVE PROTEIN: CRP: 40 mg/L — ABNORMAL HIGH (ref 0–10)

## 2019-03-15 LAB — SEDIMENTATION RATE: Sed Rate: 80 mm/hr — ABNORMAL HIGH (ref 0–40)

## 2019-03-15 NOTE — Telephone Encounter (Addendum)
Spoke with pt's son [name]. Reviewed Dr. Cathren Laine message. He stated that her legs are inflamed which is not new for her, but they are bothering her. The sensations on the side of her face have returned after stopping the prednisone. He is unsure how severe it is, but he knows the prednisone definitely helped. He also stated on the phone that the patient is having an appointment right now with Dr. Criss Rosales or her NP for the leg swelling. He gave verbal consent to do a video visit with Dr. Jaynee Eagles & pt tomorrow morning at 10:00 AM and provided consent to file the visit with pt's insurance. He is familiar with webex and will download on his iphone. He is aware that audio & video is needed for visit. He provided his email for the invitation: [email or web address] He verbalized appreciation for the call and stated pt's PMH, etc have not changed. Only change with medications is she is no longer taking prednisone.   Meeting number (access code): [phone] Meeting password: [email or web address]

## 2019-03-15 NOTE — Telephone Encounter (Signed)
Thnaks, I called and discussed with Dr. Criss Rosales. Patient has complicated hx of multiple co-morbitidies that could cause elevated esr. She was seen today for swelling in leg and send for doppler, DVT can cause elevated ESR. Needs a through eval. Discussed that since we do not know the etiology, we will hold off on steroids , no vision loss or vision changes or other symptoms of gca. Dr. Criss Rosales will look for my note tomorrow and supplement with further workup as indicated by her clinical judgement.

## 2019-03-15 NOTE — Progress Notes (Signed)
Subjective: 68 year old female presents the office today for concerns of swelling as well as her toenail issues.  She is had swelling for some time.  She did not have the compression socks we discussed last appointment.  She denies any pain and swelling she denies any redness or warmth.  She denies any cramping. She also states that she would like to get new shoes. She did see Liliane Channel last appointment and apparently shoes were given to her. Her nails are thick and elongated and cause irritation when wearing shoes and she cannot cut them. No redness, drainage or swelling to the toenail sites. Denies any systemic complaints such as fevers, chills, nausea, vomiting. No acute changes since last appointment, and no other complaints at this time.   Objective: AAO x3, NAD DP/PT pulses palpable bilaterally, CRT less than 3 seconds Nails are hypertrophic, dystrophic, brittle, discolored, elongated 10. No surrounding redness or drainage. Tenderness nails 1-5 bilaterally. No open lesions or pre-ulcerative lesions are identified today. Chronic swelling to bilateral lower extremities. She does report some increase in the last few weeks. There is no erythema or warmth and there is no open lesions.  There is no pain with calf compression. No edema, erythema, increase in warmth to bilateral lower extremities.  No open lesions or pre-ulcerative lesions.   Assessment: Symptomatic onychomycosis, bilateral chronic swelling  Plan: -All treatment options discussed with the patient including all alternatives, risks, complications.  -There is chronic swelling present bilateral.  New prescription for compression socks were given.  We will do a light compression sock. Also ordered a venous duplex.  I doubt that she has a DVT given the chronic swelling but will check. -Nails debrided x10 without any complications or bleeding -Unfortunately she is not diabetic she is not off of her diabetic shoes. Liliane Channel saw her today and  discussed shoes.  -Patient encouraged to call the office with any questions, concerns, change in symptoms.   Trula Slade DPM

## 2019-03-15 NOTE — Telephone Encounter (Signed)
Patients esr and crp significantly elevated again (esr 80, crp 40). Unclear etiology, needs followup with pcp for evaluation of other causes of this or to discuss possible referral to rheumatology. Symptoms less likely for temporal arteritis, however cannot rule this out. Unfortunately. She refused a temporal artery biopsy in the past and now these procedures may not be available due to [place].  Will send this note to her PCP Dr. Criss Rosales as well. I will see patient via webex or in the office depending on her current symptom severity and possibly restart steroids pending exam and symptoms. On steroids her ESR reduced to 21. Elevated ESR may not ne neurologic so need Dr. Criss Rosales to also evaluate patient for other causes.   Beth[place], Please discuss with patient.  Dr. Criss Rosales, any recommendations? Can you initiate a workup on your end for other non-neurologic causes of elevated esr/crp?   thanks

## 2019-03-15 NOTE — Progress Notes (Signed)
[id number]    Provider:  Dr Jaynee Santana Referring Provider: Lucianne Lei, MD Primary Care Provider:  Lucianne Lei, MD  CC:  Numbness or right side of ear and face. Improved.   Virtual Visit via Video Note  I connected with Lauren Santana and her son on 03/19/19 at 10:00 AM EDT by a video enabled telemedicine application and verified that I am speaking with the correct person using two identifiers.   I discussed the limitations of evaluation and management by telemedicine and the availability of in person appointments. The patient expressed understanding and agreed to proceed.Physician in the office, family at home.   Interval history 03/16/2019: She denies any pain in the face, the symptoms are improved. She is taking some medication that helps from Lauren. Criss Santana. The prednisone helped with her right foot swelling, as she weaned off the prednisone the leg got worse. She has always had trouble with her legs. The prednisone helped significantly in the legs and feet, she has swelling there. Her legs have always been "big" she fell and her right side is sore. She is going to get an ultrasound today on her right leg. Or cellulitis, Lauren. Criss Santana thought she may have infection/cellulitis. But the symptoms in her face and numbness is improved. No pain. She still feels the nerve.  Sometimes radiate to the eyes but mostly V2/V3 and she denies any pain headache, new vision changes, fever, neck pain, jaw claudication. Explained unclear etiology for elevated ESR and CRP. Not c/w GCA. But for any vision changes call 911 or anything concerning or acute worsening go to ED. Discussed with Lauren. Criss Santana, patient has multiple medical issues and is being evaluated for DVT, multiple reasons for elevated ESR. At this time we agree not to use steroids. Will follow clinically.  Interval history 01/25/2019: I discussed the plan with general surgery for biopsy but patient wanted to come in and talk about it anyway.     This MRI of the brain with and without contrast shows the following: 1.   There are multiple single and confluent T2/flair hyperintense foci in the subcortical and deep white matter and one small focus in the pons most consistent with moderate chronic microvascular ischemic change.  None of these foci appear to be acute. 2.   There is a normal enhancement pattern and there are no acute findings.  Addendum: ESR 90.I had a very low suspicion of temporal arteritis/Giant Cell Arteritis(GCA) when I saw her but checked sed rate to be sure - and her sed rate came back at 90 significantly elevated. Her symptoms do not include vision changes but she does report feeling like something is in her eye. Her other symptoms don't fit GCA either such as numbness in the face and pain in front of the ear(not in the temple and no headache per se). She did not endorse any jaw claudication or polymyalgia rheumatica symptoms either. Very unclear why she would have Sed rate at 29. But given the risks I started her on steroids and she has improved, will order temporal artery biopsy. She will have to follow closely with Lauren. Criss Santana to ensure her glucose does not spiral.  HPI:  Lauren Santana is a 68 y.o. female here as requested by provider Lauren Lei, MD for paresthesias of skin.  Past medical history prediabetes (hemoglobin A1c 6.2), arthritis, lower extremity edema bilaterally, high cholesterol, hypertension, numbness in the bilateral lower extremities.  She is on Xarelto.  She also takes gabapentin and  tramadol. Here with son who also providesmuch information. 3 months ago started with something in her eye, and whatever it was went down to the right lower cheek. She used a hair pin in her ear because it was itchy and she developed an ear ache. Last Thursday she started feeling something in her ear and in front of her ear. Also lip and right chin felt ike numbness. Son says she has some anxiety about stroke. She still has  some numbness and tingling on the right lower face. Lyrica helps. No weakness, no dofficulty swallowing, no pain. Her jaw and gums feel numb. Radiates dow tp the jaw. No vision changes. No jaw claudication. No rash. No lesions. No other focal neurologic deficits, associated symptoms, inciting events or modifiable factors.  Reviewed notes, labs and imaging from outside physicians, which showed:  Reviewed notes.  Patient was seen in podiatry in May 2019 by Lauren. Paulino Santana.  They discussed compression socks given chronic swelling to both feet.  Swelling has been ongoing for some time.  She has no pain to her feet her legs.  She just wants to be able to walk better and wear more supportive shoes.  Denies any falls.  She has been going to physical therapy.  No recent injury.  Pulses were normal distally in the lower extremities, no pain with calf compression, there was swelling chronically but no erythema or acute signs of DVT.  Neurologically she is intact light touch bilaterally, vibration intact via tuning fork bilaterally, intact with monofilament to pedal sites bilaterally.  Reviewed notes from referring physician Lauren. Criss Santana.  She is a non-smoker, nondrinker.  Per notes at [facility] clinic, she has full range of motion of the eyebrows of the eye opening and shutting lifting of the eyebrows, moving her tongue, no deficit in the nerve at this time.  She was started on Lyrica 75 mg twice a day and sent for neurology consult.  I reviewed her labs which include LDL of 57, BUN 11 creatinine 0.94 labs were drawn November 22, 2018, CMP and CBC were normal, elevated hemoglobin A1c at 6.2  Review of Systems: Patient complains of symptoms per HPI as well as the following symptoms: numbness. Pertinent negatives and positives per HPI. All others negative.   Social History   Socioeconomic History  . Marital status: Divorced    Spouse name: Not on file  . Number of children: 2  . Years of education: Not on file  .  Highest education level: High school graduate  Occupational History  . Not on file  Social Needs  . Financial resource strain: Not on file  . Food insecurity:    Worry: Not on file    Inability: Not on file  . Transportation needs:    Medical: Not on file    Non-medical: Not on file  Tobacco Use  . Smoking status: Never Smoker  . Smokeless tobacco: Never Used  Substance and Sexual Activity  . Alcohol use: No  . Drug use: No  . Sexual activity: Not Currently  Lifestyle  . Physical activity:    Days per week: Not on file    Minutes per session: Not on file  . Stress: Not on file  Relationships  . Social connections:    Talks on phone: Not on file    Gets together: Not on file    Attends religious service: Not on file    Active member of club or organization: Not on file    Attends  meetings of clubs or organizations: Not on file    Relationship status: Not on file  . Intimate partner violence:    Fear of current or ex partner: Not on file    Emotionally abused: Not on file    Physically abused: Not on file    Forced sexual activity: Not on file  Other Topics Concern  . Not on file  Social History Narrative   Lives alone    Right handed    Family History  Problem Relation Age of Onset  . Colon cancer Sister   . Cancer Sister        unsure of type of cancer    Past Medical History:  Diagnosis Date  . Arthritis   . Edema    B/LLE  . High cholesterol   . Hypertension   . Numbness    B/LLE  . Wears glasses     Patient Active Problem List   Diagnosis Date Noted  . 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 S/P BSO (bilateral salpingo-oophorectomy) 01/08/2017  . S/P hysterectomy 01/08/2017  . Tachycardia   . Acute deep vein thrombosis (DVT) of right lower extremity ([place]) 02/14/2016  . Pulmonary emboli ([place]) 02/13/2016  . PE (pulmonary embolism) 02/13/2016  . AKI (acute kidney injury) ([place]) 02/13/2016  . Pulmonary embolism with acute cor pulmonale ([place])   . Pelvic mass  in female   . Morbid obesity with BMI of 40.0-44.9, adult ([place]) 08/20/2015  . 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 Essential hypertension, benign 07/23/2014  . Edema 07/23/2014  . Dyslipidemia 07/23/2014  . Lower extremity numbness 07/23/2014  . 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 Postmenopausal bleeding 03/10/2012  . Endometriosis of pelvis 01/22/2007    Past Surgical History:  Procedure Laterality Date  . ABDOMINAL HYSTERECTOMY     and BSO  . CATARACT EXTRACTION Left 10/2018  . [place]  . COLONOSCOPY    . HERNIA REPAIR  2001   incisional  . MULTIPLE TOOTH EXTRACTIONS  2011  . neck mass removal     "fatty tissue, not cancerous" per pt's son  . TOTAL KNEE ARTHROPLASTY Left 07/17/2014   Procedure: TOTAL KNEE ARTHROPLASTY;  Surgeon: Kerin Salen, MD;  Location: [place];  Service: Orthopedics;  Laterality: Left;    Current Outpatient Medications  Medication Sig Dispense Refill  . atorvastatin (LIPITOR) 10 MG tablet TAKE ONE TABLET BY MOUTH  ONCE DAILY IN THE EVENING 30 tablet 0  . diphenhydrAMINE (BENADRYL) 25 MG tablet Take 25 mg by mouth every 6 (six) hours as needed for itching or allergies.    . furosemide (LASIX) 20 MG tablet Take 1 tablet (20 mg total) by mouth daily. 30 tablet 0  . gabapentin (NEURONTIN) 300 MG capsule Take 300 mg by mouth 3 (three) times daily.    . indapamide (LOZOL) 2.5 MG tablet Take 2.5 mg by mouth daily.    Marland Kitchen KLOR-CON M10 10 MEQ tablet TAKE ONE TABLET BY MOUTH ONCE DAILY (Patient taking differently: TAKE 10 MEQ BY MOUTH ONCE DAILY) 30 tablet 0  . labetalol (NORMODYNE) 100 MG tablet Take 100 mg by mouth 2 (two) times daily.    Marland Kitchen lisinopril (PRINIVIL,ZESTRIL) 10 MG tablet Take 1 tablet (10 mg total) by mouth at bedtime. RESUME ONLY AFTER SEEN BY YOUR PCP AND IF BLOOD PRESSURE IS ELEVATED (Patient not taking: Reported on 09/08/2017)    . norethindrone (CAMILA) 0.35 MG tablet Take 1 tablet by mouth daily.    Marland Kitchen  omeprazole  (PRILOSEC) 20 MG capsule Take 20 mg by mouth daily.    . pregabalin (LYRICA) 75 MG capsule Take 75 mg by mouth 2 (two) times daily.    . Rivaroxaban (XARELTO STARTER PACK) 15 & 20 MG TBPK Take as directed on package: Start with one 34m tablet by mouth twice a day with food. On Day 22, switch to one 267mtablet once a day with food. (Patient not taking: Reported on 09/08/2017) 51 each 0  . rivaroxaban (XARELTO) 20 MG TABS tablet Take 1 tablet (20 mg total) by mouth daily with supper. START ONLY AFTER YOU HAVE FINISHED ALL THE TABLETS IN THE STARTER PACK (Patient not taking: Reported on 09/08/2017) 30 tablet 2  . traMADol (ULTRAM) 50 MG tablet Take 50 mg by mouth 2 (two) times daily.  0   No current facility-administered medications for this visit.     Allergies as of 03/16/2019 - Review Complete 03/15/2019  Allergen Reaction Noted  . Tylenol [acetaminophen] Other (See Comments) 08/24/2015  . Sulfa antibiotics Other (See Comments) 03/03/2012  . Sulfasalazine Other (See Comments) 03/03/2012    Vitals: There were no vitals taken for this visit. Last Weight:  Wt Readings from Last 1 Encounters:  01/25/19 238 lb (108 kg)   Last Height:   Ht Readings from Last 1 Encounters:  01/25/19 '5\' 1"'  (1.549 m)   PRIOR exam, no changes today  Physical exam: Exam: Gen: NAD, conversant, well nourised, obese, well groomed                     CV: RRR, no MRG. No Carotid Bruits. +peripheral edema, warm, nontender Eyes: Conjunctivae clear without exudates or hemorrhage  Neuro: Detailed Neurologic Exam  Speech:    Speech is normal; fluent and spontaneous with normal comprehension.  Cognition:    The patient is oriented to person, place, and time;     recent and remote memory intact;     language fluent;     normal attention, concentration,     fund of knowledge Cranial Nerves:    The pupils are equal, round, and reactive to light. Attempted findoscopy could not visualizw due to small pupils. .  Visual fields are full to finger confrontation. Extraocular movements are intact. Trigeminal sensation is intact and the muscles of mastication are normal. The face is symmetric. The palate elevates in the midline. Hearing intact. Voice is normal. Shoulder shrug is normal. The tongue has normal motion without fasciculations.   Coordination:    No dysmetroa  Gait:    Wide based with a walker  Motor Observation:    No asymmetry, no atrophy, and no involuntary movements noted. Tone:    Normal muscle tone.    Posture:    Slightly stooped    Strength: Prox LE weakness. Otherwise strength is V/V in the upper and lower limbs.      Sensation: intact to LT     Reflex Exam:  DTR's:    Deep tendon reflexes in the upper are normal bilaterally, absent in the lowers ay be due to swelling Toes:    The toes are equiv bilaterally.   Clonus:    Clonus is absent.    Assessment/Plan:  LiSHAINDEL SWEETENs a [age].o. female here as requested by provider BlLucianne LeiMD for paresthesias of skin.  Past medical history prediabetes (hemoglobin A1c 6.2), arthritis, lower extremity edema bilaterally, high cholesterol, hypertension, numbness in the bilateral lower extremities.  She is on Xarelto.  MRI unremarkable. She has improved numbness on the side if the face, in the distribution of the trigeminal nerve. May have caused damage by using a hair pin (a lot) to itch her ear on that side. 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  Discussed with patient it is unclear why her sed rate was 90 was 21 after steroids now 80, I have very low suspicion for GCA but checked to make sure. They declined an artery biopsy at this time they would like to stay off of the steroids and just follow her. Unclear why she had sed rate 90 last 80. Will titrate off over 4-5 weeks, recheck esr in 6 weeks and regularly afterwards.   Consider healthy weight [facility] wellness center for obesity  Pending US doppler for  leg swelling, no redness  Facial numbness improved  Spoke to Lauren bland, appreciate her. Lauren. Criss Santana is evaluating for DVT and patient has multiple other medical conditions that can affect ESR. She is following closely. I called and discussed with Lauren. Criss Santana. Patient has complicated hx of multiple co-morbitidies that could cause elevated esr. She was seen today for swelling in leg and send for doppler, DVT can cause elevated ESR. Needs a through eval. Discussed that since we do not know the etiology, we will hold off on steroids , no vision loss or vision changes or other symptoms of gca. Lauren. Criss Santana will look for my note tomorrow and supplement with further workup as indicated by her clinical judgement.   Follow Up Instructions:    I discussed the assessment and treatment plan with the patient. The patient was provided an opportunity to ask questions and all were answered. The patient agreed with the plan and demonstrated an understanding of the instructions.   The patient was advised to call back or seek an in-person evaluation if the symptoms worsen or if the condition fails to improve as anticipated.  I provided 40 minutes of non-in-person time during this encounter.   Melvenia Beam, MD  Addendum 01/2019: ESR 90.I had a very low suspicion of temporal arteritis/Giant Cell Arteritis(GCA) when I saw her but checked sed rate to be sure - and her sed rate came back at 90 significantly elevated. Her symptoms do not include vision changes but she does report feeling like something is in her eye. Her other symptoms don't fit GCA either such as numbness in the face and pain in front of the ear(not in the temple and no headache per se). She did not endorse any jaw claudication or polymyalgia rheumatica symptoms either. Very unclear why she would have Sed rate at 44. But given the risks I started her on steroids and she has improved, will order temporal artery biopsy. She will have to follow closely with Lauren.  Criss Santana to ensure her glucose does not spiral.    Cc: Lauren Lei, MD,    Sarina Ill, MD  [facility] [address] [place], [place] [address]  Phone [phone] Fax [phone]  A total of 40 minutes was spent Video face-to-face with this patient. Over half this time was spent on counseling patient on the  1. Facial numbness   2. Lower extremity numbness   3. Edema, unspecified type   4. Morbid obesity with BMI of 40.0-44.9, adult ([place])    diagnosis and different diagnostic and therapeutic options, counseling and coordination of care, risks ans benefits of management, compliance, or risk factor reduction and education.

## 2019-03-16 ENCOUNTER — Other Ambulatory Visit: Payer: Self-pay

## 2019-03-16 ENCOUNTER — Ambulatory Visit (HOSPITAL_COMMUNITY)
Admission: RE | Admit: 2019-03-16 | Discharge: 2019-03-16 | Disposition: A | Discharge status: Home / Self Care | Payer: PPO | Source: Ambulatory Visit | Attending: Family | Admitting: Family

## 2019-03-16 ENCOUNTER — Other Ambulatory Visit (HOSPITAL_COMMUNITY): Payer: Self-pay | Admitting: Family Medicine

## 2019-03-16 ENCOUNTER — Ambulatory Visit (INDEPENDENT_AMBULATORY_CARE_PROVIDER_SITE_OTHER): Payer: PPO | Admitting: Neurology

## 2019-03-16 DIAGNOSIS — R200 Anesthesia of skin: Secondary | ICD-10-CM | POA: Diagnosis not present

## 2019-03-16 DIAGNOSIS — M79604 Pain in right leg: Secondary | ICD-10-CM

## 2019-03-16 DIAGNOSIS — R609 Edema, unspecified: Secondary | ICD-10-CM | POA: Diagnosis not present

## 2019-03-16 DIAGNOSIS — Z6841 Body Mass Index (BMI) 40.0 and over, adult: Secondary | ICD-10-CM

## 2019-03-16 NOTE — Telephone Encounter (Signed)
Called and spoke with her son [name] and stated that [facility]) may measure for the compression socks for the patient and to call back to the office if any concerns or questions. [name]

## 2019-03-19 ENCOUNTER — Encounter: Payer: Self-pay | Admitting: Neurology

## 2019-03-29 DIAGNOSIS — R7303 Prediabetes: Secondary | ICD-10-CM | POA: Diagnosis not present

## 2019-03-29 DIAGNOSIS — F064 Anxiety disorder due to known physiological condition: Secondary | ICD-10-CM | POA: Diagnosis not present

## 2019-03-29 DIAGNOSIS — R799 Abnormal finding of blood chemistry, unspecified: Secondary | ICD-10-CM | POA: Diagnosis not present

## 2019-03-29 DIAGNOSIS — I10 Essential (primary) hypertension: Secondary | ICD-10-CM | POA: Diagnosis not present

## 2019-03-29 DIAGNOSIS — E876 Hypokalemia: Secondary | ICD-10-CM | POA: Diagnosis not present

## 2019-03-29 DIAGNOSIS — E786 Lipoprotein deficiency: Secondary | ICD-10-CM | POA: Diagnosis not present

## 2019-03-29 DIAGNOSIS — R700 Elevated erythrocyte sedimentation rate: Secondary | ICD-10-CM | POA: Diagnosis not present

## 2019-04-01 ENCOUNTER — Ambulatory Visit: Payer: PPO | Admitting: Orthotics

## 2019-04-01 ENCOUNTER — Other Ambulatory Visit: Payer: Self-pay

## 2019-04-01 DIAGNOSIS — R2681 Unsteadiness on feet: Secondary | ICD-10-CM

## 2019-04-01 DIAGNOSIS — Z7901 Long term (current) use of anticoagulants: Secondary | ICD-10-CM

## 2019-04-01 DIAGNOSIS — R609 Edema, unspecified: Secondary | ICD-10-CM

## 2019-04-01 DIAGNOSIS — B351 Tinea unguium: Secondary | ICD-10-CM

## 2019-04-01 DIAGNOSIS — M79675 Pain in left toe(s): Secondary | ICD-10-CM

## 2019-04-01 DIAGNOSIS — M79674 Pain in right toe(s): Secondary | ICD-10-CM

## 2019-04-05 NOTE — Progress Notes (Signed)
Patient picked up cash pay shoes.

## 2019-04-20 DIAGNOSIS — N390 Urinary tract infection, site not specified: Secondary | ICD-10-CM | POA: Diagnosis not present

## 2019-04-25 ENCOUNTER — Encounter (HOSPITAL_COMMUNITY): Payer: PPO

## 2019-04-25 DIAGNOSIS — H25041 Posterior subcapsular polar age-related cataract, right eye: Secondary | ICD-10-CM | POA: Diagnosis not present

## 2019-04-25 DIAGNOSIS — H2511 Age-related nuclear cataract, right eye: Secondary | ICD-10-CM | POA: Diagnosis not present

## 2019-04-25 DIAGNOSIS — H04123 Dry eye syndrome of bilateral lacrimal glands: Secondary | ICD-10-CM | POA: Diagnosis not present

## 2019-05-04 DIAGNOSIS — Z0000 Encounter for general adult medical examination without abnormal findings: Secondary | ICD-10-CM | POA: Diagnosis not present

## 2019-05-04 DIAGNOSIS — K219 Gastro-esophageal reflux disease without esophagitis: Secondary | ICD-10-CM | POA: Diagnosis not present

## 2019-05-04 DIAGNOSIS — M79606 Pain in leg, unspecified: Secondary | ICD-10-CM | POA: Diagnosis not present

## 2019-05-04 DIAGNOSIS — F064 Anxiety disorder due to known physiological condition: Secondary | ICD-10-CM | POA: Diagnosis not present

## 2019-05-25 DIAGNOSIS — E1169 Type 2 diabetes mellitus with other specified complication: Secondary | ICD-10-CM | POA: Diagnosis not present

## 2019-05-25 DIAGNOSIS — I10 Essential (primary) hypertension: Secondary | ICD-10-CM | POA: Diagnosis not present

## 2019-05-26 DIAGNOSIS — M25571 Pain in right ankle and joints of right foot: Secondary | ICD-10-CM | POA: Diagnosis not present

## 2019-05-26 DIAGNOSIS — Z96652 Presence of left artificial knee joint: Secondary | ICD-10-CM | POA: Diagnosis not present

## 2019-05-26 DIAGNOSIS — Z09 Encounter for follow-up examination after completed treatment for conditions other than malignant neoplasm: Secondary | ICD-10-CM | POA: Diagnosis not present

## 2019-05-26 DIAGNOSIS — M1711 Unilateral primary osteoarthritis, right knee: Secondary | ICD-10-CM | POA: Diagnosis not present

## 2019-06-02 DIAGNOSIS — H25041 Posterior subcapsular polar age-related cataract, right eye: Secondary | ICD-10-CM | POA: Diagnosis not present

## 2019-06-02 DIAGNOSIS — H25811 Combined forms of age-related cataract, right eye: Secondary | ICD-10-CM | POA: Diagnosis not present

## 2019-06-02 DIAGNOSIS — H2511 Age-related nuclear cataract, right eye: Secondary | ICD-10-CM | POA: Diagnosis not present

## 2019-06-08 ENCOUNTER — Telehealth: Payer: Self-pay | Admitting: *Deleted

## 2019-06-08 NOTE — Telephone Encounter (Signed)
Pt's message cut off. I left message informing pt her message had cut off, if she needed information contact me [phone] or if she needed an appt call [phone].

## 2019-06-09 ENCOUNTER — Other Ambulatory Visit: Payer: Self-pay

## 2019-06-09 ENCOUNTER — Encounter: Payer: Self-pay | Admitting: Podiatry

## 2019-06-09 ENCOUNTER — Ambulatory Visit: Payer: PPO | Admitting: Podiatry

## 2019-06-09 DIAGNOSIS — M79674 Pain in right toe(s): Secondary | ICD-10-CM | POA: Diagnosis not present

## 2019-06-09 DIAGNOSIS — R609 Edema, unspecified: Secondary | ICD-10-CM

## 2019-06-09 DIAGNOSIS — R2681 Unsteadiness on feet: Secondary | ICD-10-CM

## 2019-06-09 DIAGNOSIS — M79675 Pain in left toe(s): Secondary | ICD-10-CM | POA: Diagnosis not present

## 2019-06-09 DIAGNOSIS — Z7901 Long term (current) use of anticoagulants: Secondary | ICD-10-CM

## 2019-06-09 DIAGNOSIS — B351 Tinea unguium: Secondary | ICD-10-CM

## 2019-06-09 DIAGNOSIS — E1149 Type 2 diabetes mellitus with other diabetic neurological complication: Secondary | ICD-10-CM

## 2019-06-09 NOTE — Progress Notes (Signed)
Subjective: 68 y.o. returns the office today for painful, elongated, thickened toenails which she cannot trim themself. Denies any redness or drainage around the nails.  She states that she still gets swelling.  She does not elevate her feet she has not been wearing compression socks.  She does continue to take Lasix.  She also states that she is starting to get more numbness to her feet at times.  She is taking gabapentin 3 mg 3 times a day.  She states that she tries to walk but she is scared to walk because she has numbness to her feet.  This is been progressively getting worse.  Denies any acute changes since last appointment and no new complaints today. Denies any systemic complaints such as fevers, chills, nausea, vomiting.   She states that she has been diagnosed with diabetes and has started synjardy.   PCP: Lucianne Lei, MD   Objective: AAO 3, NAD DP/PT pulses palpable, CRT less than 3 seconds Chronic bilateral lower extremity edema is present. Protective sensation mildly decreased with Simms Weinstein monofilament Nails hypertrophic, dystrophic, elongated, brittle, discolored 10. There is tenderness overlying the nails 1-5 bilaterally. There is no surrounding erythema or drainage along the nail sites. No open lesions or pre-ulcerative lesions are identified. No other areas of tenderness bilateral lower extremities. No overlying edema, erythema, increased warmth. No pain with calf compression, swelling, warmth, erythema.  Assessment: Patient presents with symptomatic onychomycosis, bilateral swelling, neuropathy  Plan: -Treatment options including alternatives, risks, complications were discussed -Nails sharply debrided 10 without complication/bleeding. -We discussed increasing her gabapentin doses 600 mg at nighttime.  Will take 1 tablet 4 times a day after 3 mg. -Regular and difficult therapy to work on gait training, instability as well as a possible neurogenix.  -Discussed  daily foot inspection. If there are any changes, to call the office immediately.  -Follow-up in 2 months or sooner if any problems are to arise. In the meantime, encouraged to call the office with any questions, concerns, changes symptoms.  Celesta Gentile, DPM

## 2019-06-10 NOTE — Addendum Note (Signed)
Addended by: Cranford Mon R on: 06/10/2019 02:16 PM   Modules accepted: Orders

## 2019-06-21 ENCOUNTER — Other Ambulatory Visit: Payer: Self-pay

## 2019-06-21 ENCOUNTER — Encounter: Payer: Self-pay | Admitting: Podiatry

## 2019-06-21 ENCOUNTER — Ambulatory Visit: Payer: PPO | Admitting: Podiatry

## 2019-06-21 DIAGNOSIS — M722 Plantar fascial fibromatosis: Secondary | ICD-10-CM

## 2019-06-21 DIAGNOSIS — E1149 Type 2 diabetes mellitus with other diabetic neurological complication: Secondary | ICD-10-CM | POA: Diagnosis not present

## 2019-06-27 DIAGNOSIS — M79671 Pain in right foot: Secondary | ICD-10-CM | POA: Diagnosis not present

## 2019-06-27 DIAGNOSIS — M79672 Pain in left foot: Secondary | ICD-10-CM | POA: Diagnosis not present

## 2019-06-27 DIAGNOSIS — M25471 Effusion, right ankle: Secondary | ICD-10-CM | POA: Diagnosis not present

## 2019-06-27 DIAGNOSIS — R262 Difficulty in walking, not elsewhere classified: Secondary | ICD-10-CM | POA: Diagnosis not present

## 2019-06-28 NOTE — Progress Notes (Signed)
Subjective: 68 year old female presents the office today for Pap evaluation neuropathy.  She states that she is about the same.  She did increase today to max her gabapentin daily to see if this was not helpful.  She still describes numbness during the day and tingling and burning at nighttime.  She also has some tingling in the morning.  She denies any recent injury or falls.  No increase in swelling.  No redness.  No cramping or claudication symptoms. Denies any systemic complaints such as fevers, chills, nausea, vomiting. No acute changes since last appointment, and no other complaints at this time.   Objective: AAO x3, NAD DP/PT pulses palpable bilaterally, CRT less than 3 seconds Not able to elicit any area tenderness today.  There is mild chronic edema bilaterally but there is no increase.  Is no erythema or warmth.  Sensation decreased with Semmes-Weinstein monofilament.  No open lesions identified.  Equinus is present.  She is describing some symptoms and discomfort the arch of the foot.  This appears to be more plantar fasciitis type symptoms.  Plantar fascial, Achilles tendon appear to be intact.  No pain with lateral compression of calcaneus. No open lesions or pre-ulcerative lesions.  No pain with calf compression, swelling, warmth, erythema  Assessment: Neuropathy, plantar fasciitis  Plan: -All treatment options discussed with the patient including all alternatives, risks, complications.  -We long discussion regards to treatment options.  She was put on gabapentin by her primary care physician she states for her hand.  Increasing gabapentin did not help.  Will restart physical therapy to help with both issues as well as for neurogenix.  Discussed is not a guarantee how to proceed prescription written today for [facility]. -Patient encouraged to call the office with any questions, concerns, change in symptoms.   Trula Slade DPM

## 2019-07-04 DIAGNOSIS — R262 Difficulty in walking, not elsewhere classified: Secondary | ICD-10-CM | POA: Diagnosis not present

## 2019-07-04 DIAGNOSIS — M79671 Pain in right foot: Secondary | ICD-10-CM | POA: Diagnosis not present

## 2019-07-04 DIAGNOSIS — M79672 Pain in left foot: Secondary | ICD-10-CM | POA: Diagnosis not present

## 2019-07-04 DIAGNOSIS — M25471 Effusion, right ankle: Secondary | ICD-10-CM | POA: Diagnosis not present

## 2019-07-08 DIAGNOSIS — M79671 Pain in right foot: Secondary | ICD-10-CM | POA: Diagnosis not present

## 2019-07-08 DIAGNOSIS — R262 Difficulty in walking, not elsewhere classified: Secondary | ICD-10-CM | POA: Diagnosis not present

## 2019-07-08 DIAGNOSIS — M25471 Effusion, right ankle: Secondary | ICD-10-CM | POA: Diagnosis not present

## 2019-07-08 DIAGNOSIS — M79672 Pain in left foot: Secondary | ICD-10-CM | POA: Diagnosis not present

## 2019-07-11 DIAGNOSIS — M79671 Pain in right foot: Secondary | ICD-10-CM | POA: Diagnosis not present

## 2019-07-11 DIAGNOSIS — M25471 Effusion, right ankle: Secondary | ICD-10-CM | POA: Diagnosis not present

## 2019-07-11 DIAGNOSIS — R262 Difficulty in walking, not elsewhere classified: Secondary | ICD-10-CM | POA: Diagnosis not present

## 2019-07-11 DIAGNOSIS — M79672 Pain in left foot: Secondary | ICD-10-CM | POA: Diagnosis not present

## 2019-07-15 DIAGNOSIS — M79672 Pain in left foot: Secondary | ICD-10-CM | POA: Diagnosis not present

## 2019-07-15 DIAGNOSIS — R262 Difficulty in walking, not elsewhere classified: Secondary | ICD-10-CM | POA: Diagnosis not present

## 2019-07-15 DIAGNOSIS — M25471 Effusion, right ankle: Secondary | ICD-10-CM | POA: Diagnosis not present

## 2019-07-15 DIAGNOSIS — M79671 Pain in right foot: Secondary | ICD-10-CM | POA: Diagnosis not present

## 2019-07-18 DIAGNOSIS — M79672 Pain in left foot: Secondary | ICD-10-CM | POA: Diagnosis not present

## 2019-07-18 DIAGNOSIS — R262 Difficulty in walking, not elsewhere classified: Secondary | ICD-10-CM | POA: Diagnosis not present

## 2019-07-18 DIAGNOSIS — M79671 Pain in right foot: Secondary | ICD-10-CM | POA: Diagnosis not present

## 2019-07-18 DIAGNOSIS — M25471 Effusion, right ankle: Secondary | ICD-10-CM | POA: Diagnosis not present

## 2019-07-19 DIAGNOSIS — I10 Essential (primary) hypertension: Secondary | ICD-10-CM | POA: Diagnosis not present

## 2019-07-19 DIAGNOSIS — E1169 Type 2 diabetes mellitus with other specified complication: Secondary | ICD-10-CM | POA: Diagnosis not present

## 2019-07-22 DIAGNOSIS — R262 Difficulty in walking, not elsewhere classified: Secondary | ICD-10-CM | POA: Diagnosis not present

## 2019-07-22 DIAGNOSIS — M79672 Pain in left foot: Secondary | ICD-10-CM | POA: Diagnosis not present

## 2019-07-22 DIAGNOSIS — M25471 Effusion, right ankle: Secondary | ICD-10-CM | POA: Diagnosis not present

## 2019-07-22 DIAGNOSIS — M79671 Pain in right foot: Secondary | ICD-10-CM | POA: Diagnosis not present

## 2019-07-25 DIAGNOSIS — N804 Endometriosis of rectovaginal septum and vagina: Secondary | ICD-10-CM | POA: Diagnosis not present

## 2019-07-25 DIAGNOSIS — N939 Abnormal uterine and vaginal bleeding, unspecified: Secondary | ICD-10-CM | POA: Diagnosis not present

## 2019-07-25 DIAGNOSIS — Z01818 Encounter for other preprocedural examination: Secondary | ICD-10-CM | POA: Diagnosis not present

## 2019-07-25 DIAGNOSIS — N809 Endometriosis, unspecified: Secondary | ICD-10-CM | POA: Diagnosis not present

## 2019-07-25 DIAGNOSIS — N950 Postmenopausal bleeding: Secondary | ICD-10-CM | POA: Diagnosis not present

## 2019-07-25 DIAGNOSIS — Z90710 Acquired absence of both cervix and uterus: Secondary | ICD-10-CM | POA: Diagnosis not present

## 2019-07-27 DIAGNOSIS — R262 Difficulty in walking, not elsewhere classified: Secondary | ICD-10-CM | POA: Diagnosis not present

## 2019-07-27 DIAGNOSIS — M79671 Pain in right foot: Secondary | ICD-10-CM | POA: Diagnosis not present

## 2019-07-27 DIAGNOSIS — M25471 Effusion, right ankle: Secondary | ICD-10-CM | POA: Diagnosis not present

## 2019-07-27 DIAGNOSIS — M79672 Pain in left foot: Secondary | ICD-10-CM | POA: Diagnosis not present

## 2019-07-29 DIAGNOSIS — R262 Difficulty in walking, not elsewhere classified: Secondary | ICD-10-CM | POA: Diagnosis not present

## 2019-07-29 DIAGNOSIS — M79672 Pain in left foot: Secondary | ICD-10-CM | POA: Diagnosis not present

## 2019-07-29 DIAGNOSIS — M79671 Pain in right foot: Secondary | ICD-10-CM | POA: Diagnosis not present

## 2019-07-29 DIAGNOSIS — M25471 Effusion, right ankle: Secondary | ICD-10-CM | POA: Diagnosis not present

## 2019-08-03 DIAGNOSIS — M79671 Pain in right foot: Secondary | ICD-10-CM | POA: Diagnosis not present

## 2019-08-03 DIAGNOSIS — M79672 Pain in left foot: Secondary | ICD-10-CM | POA: Diagnosis not present

## 2019-08-03 DIAGNOSIS — R262 Difficulty in walking, not elsewhere classified: Secondary | ICD-10-CM | POA: Diagnosis not present

## 2019-08-03 DIAGNOSIS — M25471 Effusion, right ankle: Secondary | ICD-10-CM | POA: Diagnosis not present

## 2019-08-05 DIAGNOSIS — M25471 Effusion, right ankle: Secondary | ICD-10-CM | POA: Diagnosis not present

## 2019-08-05 DIAGNOSIS — M79671 Pain in right foot: Secondary | ICD-10-CM | POA: Diagnosis not present

## 2019-08-05 DIAGNOSIS — M79672 Pain in left foot: Secondary | ICD-10-CM | POA: Diagnosis not present

## 2019-08-05 DIAGNOSIS — R262 Difficulty in walking, not elsewhere classified: Secondary | ICD-10-CM | POA: Diagnosis not present

## 2019-08-09 ENCOUNTER — Encounter: Payer: Self-pay | Admitting: Podiatry

## 2019-08-09 ENCOUNTER — Ambulatory Visit: Payer: PPO | Admitting: Podiatry

## 2019-08-09 ENCOUNTER — Other Ambulatory Visit: Payer: Self-pay

## 2019-08-09 DIAGNOSIS — M79675 Pain in left toe(s): Secondary | ICD-10-CM | POA: Diagnosis not present

## 2019-08-09 DIAGNOSIS — M79674 Pain in right toe(s): Secondary | ICD-10-CM

## 2019-08-09 DIAGNOSIS — B351 Tinea unguium: Secondary | ICD-10-CM | POA: Diagnosis not present

## 2019-08-09 DIAGNOSIS — E1149 Type 2 diabetes mellitus with other diabetic neurological complication: Secondary | ICD-10-CM | POA: Diagnosis not present

## 2019-08-10 DIAGNOSIS — R262 Difficulty in walking, not elsewhere classified: Secondary | ICD-10-CM | POA: Diagnosis not present

## 2019-08-10 DIAGNOSIS — M25471 Effusion, right ankle: Secondary | ICD-10-CM | POA: Diagnosis not present

## 2019-08-10 DIAGNOSIS — M79671 Pain in right foot: Secondary | ICD-10-CM | POA: Diagnosis not present

## 2019-08-10 DIAGNOSIS — M79672 Pain in left foot: Secondary | ICD-10-CM | POA: Diagnosis not present

## 2019-08-10 NOTE — Progress Notes (Signed)
Subjective: 68 y.o. returns the office today for painful, elongated, thickened toenails which she cannot trim herself. Denies any redness or drainage around the nails.  She reports that overall her feet are doing well and physical therapy is been very helpful.  Denies any acute changes since last appointment and no new complaints today. Denies any systemic complaints such as fevers, chills, nausea, vomiting.   PCP: Lucianne Lei, MD  Objective: AAO 3, NAD DP/PT pulses palpable, CRT less than 3 seconds  Nails hypertrophic, dystrophic, elongated, brittle, discolored 10. There is tenderness overlying the nails 1-5 bilaterally. There is no surrounding erythema or drainage along the nail sites. No open lesions or pre-ulcerative lesions are identified. Overall the pain that she has been having is much improved.  Second examination. No other areas of tenderness bilateral lower extremities. No overlying edema, erythema, increased warmth. No pain with calf compression, swelling, warmth, erythema.  Assessment: Patient presents with symptomatic onychomycosis  Plan: -Treatment options including alternatives, risks, complications were discussed -Nails sharply debrided 10 without complication/bleeding. -Continue physical therapy this is been helpful for her.  Continue with supportive shoes. -Discussed daily foot inspection. If there are any changes, to call the office immediately.  -Follow-up in 3 months or sooner if any problems are to arise. In the meantime, encouraged to call the office with any questions, concerns, changes symptoms.  Celesta Gentile, DPM

## 2019-08-12 DIAGNOSIS — H4921 Sixth [abducent] nerve palsy, right eye: Secondary | ICD-10-CM | POA: Diagnosis not present

## 2019-08-12 DIAGNOSIS — M79672 Pain in left foot: Secondary | ICD-10-CM | POA: Diagnosis not present

## 2019-08-12 DIAGNOSIS — R262 Difficulty in walking, not elsewhere classified: Secondary | ICD-10-CM | POA: Diagnosis not present

## 2019-08-12 DIAGNOSIS — T1511XA Foreign body in conjunctival sac, right eye, initial encounter: Secondary | ICD-10-CM | POA: Diagnosis not present

## 2019-08-12 DIAGNOSIS — M79671 Pain in right foot: Secondary | ICD-10-CM | POA: Diagnosis not present

## 2019-08-12 DIAGNOSIS — M25471 Effusion, right ankle: Secondary | ICD-10-CM | POA: Diagnosis not present

## 2019-08-15 DIAGNOSIS — M79671 Pain in right foot: Secondary | ICD-10-CM | POA: Diagnosis not present

## 2019-08-15 DIAGNOSIS — M79672 Pain in left foot: Secondary | ICD-10-CM | POA: Diagnosis not present

## 2019-08-15 DIAGNOSIS — R262 Difficulty in walking, not elsewhere classified: Secondary | ICD-10-CM | POA: Diagnosis not present

## 2019-08-15 DIAGNOSIS — M25471 Effusion, right ankle: Secondary | ICD-10-CM | POA: Diagnosis not present

## 2019-08-18 DIAGNOSIS — G9009 Other idiopathic peripheral autonomic neuropathy: Secondary | ICD-10-CM | POA: Diagnosis not present

## 2019-08-18 DIAGNOSIS — R7303 Prediabetes: Secondary | ICD-10-CM | POA: Diagnosis not present

## 2019-08-18 DIAGNOSIS — I10 Essential (primary) hypertension: Secondary | ICD-10-CM | POA: Diagnosis not present

## 2019-08-18 DIAGNOSIS — E669 Obesity, unspecified: Secondary | ICD-10-CM | POA: Diagnosis not present

## 2019-08-19 DIAGNOSIS — M79672 Pain in left foot: Secondary | ICD-10-CM | POA: Diagnosis not present

## 2019-08-19 DIAGNOSIS — R262 Difficulty in walking, not elsewhere classified: Secondary | ICD-10-CM | POA: Diagnosis not present

## 2019-08-19 DIAGNOSIS — M25471 Effusion, right ankle: Secondary | ICD-10-CM | POA: Diagnosis not present

## 2019-08-19 DIAGNOSIS — M79671 Pain in right foot: Secondary | ICD-10-CM | POA: Diagnosis not present

## 2019-08-22 DIAGNOSIS — R262 Difficulty in walking, not elsewhere classified: Secondary | ICD-10-CM | POA: Diagnosis not present

## 2019-08-22 DIAGNOSIS — M79672 Pain in left foot: Secondary | ICD-10-CM | POA: Diagnosis not present

## 2019-08-22 DIAGNOSIS — M25471 Effusion, right ankle: Secondary | ICD-10-CM | POA: Diagnosis not present

## 2019-08-22 DIAGNOSIS — M79671 Pain in right foot: Secondary | ICD-10-CM | POA: Diagnosis not present

## 2019-08-25 DIAGNOSIS — R262 Difficulty in walking, not elsewhere classified: Secondary | ICD-10-CM | POA: Diagnosis not present

## 2019-08-25 DIAGNOSIS — M79671 Pain in right foot: Secondary | ICD-10-CM | POA: Diagnosis not present

## 2019-08-25 DIAGNOSIS — M79672 Pain in left foot: Secondary | ICD-10-CM | POA: Diagnosis not present

## 2019-08-25 DIAGNOSIS — M25471 Effusion, right ankle: Secondary | ICD-10-CM | POA: Diagnosis not present

## 2019-08-30 DIAGNOSIS — R262 Difficulty in walking, not elsewhere classified: Secondary | ICD-10-CM | POA: Diagnosis not present

## 2019-08-30 DIAGNOSIS — M79671 Pain in right foot: Secondary | ICD-10-CM | POA: Diagnosis not present

## 2019-08-30 DIAGNOSIS — M79672 Pain in left foot: Secondary | ICD-10-CM | POA: Diagnosis not present

## 2019-08-30 DIAGNOSIS — M25471 Effusion, right ankle: Secondary | ICD-10-CM | POA: Diagnosis not present

## 2019-09-02 DIAGNOSIS — M25471 Effusion, right ankle: Secondary | ICD-10-CM | POA: Diagnosis not present

## 2019-09-02 DIAGNOSIS — M79671 Pain in right foot: Secondary | ICD-10-CM | POA: Diagnosis not present

## 2019-09-02 DIAGNOSIS — R262 Difficulty in walking, not elsewhere classified: Secondary | ICD-10-CM | POA: Diagnosis not present

## 2019-09-02 DIAGNOSIS — M79672 Pain in left foot: Secondary | ICD-10-CM | POA: Diagnosis not present

## 2019-09-05 DIAGNOSIS — M79672 Pain in left foot: Secondary | ICD-10-CM | POA: Diagnosis not present

## 2019-09-05 DIAGNOSIS — M79671 Pain in right foot: Secondary | ICD-10-CM | POA: Diagnosis not present

## 2019-09-05 DIAGNOSIS — M25471 Effusion, right ankle: Secondary | ICD-10-CM | POA: Diagnosis not present

## 2019-09-05 DIAGNOSIS — R262 Difficulty in walking, not elsewhere classified: Secondary | ICD-10-CM | POA: Diagnosis not present

## 2019-09-09 DIAGNOSIS — M79672 Pain in left foot: Secondary | ICD-10-CM | POA: Diagnosis not present

## 2019-09-09 DIAGNOSIS — R262 Difficulty in walking, not elsewhere classified: Secondary | ICD-10-CM | POA: Diagnosis not present

## 2019-09-09 DIAGNOSIS — M25471 Effusion, right ankle: Secondary | ICD-10-CM | POA: Diagnosis not present

## 2019-09-09 DIAGNOSIS — M79671 Pain in right foot: Secondary | ICD-10-CM | POA: Diagnosis not present

## 2019-09-12 DIAGNOSIS — M79672 Pain in left foot: Secondary | ICD-10-CM | POA: Diagnosis not present

## 2019-09-12 DIAGNOSIS — R262 Difficulty in walking, not elsewhere classified: Secondary | ICD-10-CM | POA: Diagnosis not present

## 2019-09-12 DIAGNOSIS — M25471 Effusion, right ankle: Secondary | ICD-10-CM | POA: Diagnosis not present

## 2019-09-12 DIAGNOSIS — M79671 Pain in right foot: Secondary | ICD-10-CM | POA: Diagnosis not present

## 2019-09-14 DIAGNOSIS — R262 Difficulty in walking, not elsewhere classified: Secondary | ICD-10-CM | POA: Diagnosis not present

## 2019-09-14 DIAGNOSIS — M25471 Effusion, right ankle: Secondary | ICD-10-CM | POA: Diagnosis not present

## 2019-09-14 DIAGNOSIS — M79672 Pain in left foot: Secondary | ICD-10-CM | POA: Diagnosis not present

## 2019-09-14 DIAGNOSIS — M79671 Pain in right foot: Secondary | ICD-10-CM | POA: Diagnosis not present

## 2019-09-19 DIAGNOSIS — M25471 Effusion, right ankle: Secondary | ICD-10-CM | POA: Diagnosis not present

## 2019-09-19 DIAGNOSIS — M79672 Pain in left foot: Secondary | ICD-10-CM | POA: Diagnosis not present

## 2019-09-19 DIAGNOSIS — M79671 Pain in right foot: Secondary | ICD-10-CM | POA: Diagnosis not present

## 2019-09-19 DIAGNOSIS — R262 Difficulty in walking, not elsewhere classified: Secondary | ICD-10-CM | POA: Diagnosis not present

## 2019-09-20 DIAGNOSIS — H04123 Dry eye syndrome of bilateral lacrimal glands: Secondary | ICD-10-CM | POA: Diagnosis not present

## 2019-09-20 DIAGNOSIS — H4921 Sixth [abducent] nerve palsy, right eye: Secondary | ICD-10-CM | POA: Diagnosis not present

## 2019-09-21 DIAGNOSIS — I10 Essential (primary) hypertension: Secondary | ICD-10-CM | POA: Diagnosis not present

## 2019-09-23 DIAGNOSIS — M25471 Effusion, right ankle: Secondary | ICD-10-CM | POA: Diagnosis not present

## 2019-09-23 DIAGNOSIS — R262 Difficulty in walking, not elsewhere classified: Secondary | ICD-10-CM | POA: Diagnosis not present

## 2019-09-23 DIAGNOSIS — M79671 Pain in right foot: Secondary | ICD-10-CM | POA: Diagnosis not present

## 2019-09-23 DIAGNOSIS — M79672 Pain in left foot: Secondary | ICD-10-CM | POA: Diagnosis not present

## 2019-09-27 DIAGNOSIS — M79672 Pain in left foot: Secondary | ICD-10-CM | POA: Diagnosis not present

## 2019-09-27 DIAGNOSIS — R262 Difficulty in walking, not elsewhere classified: Secondary | ICD-10-CM | POA: Diagnosis not present

## 2019-09-27 DIAGNOSIS — M25471 Effusion, right ankle: Secondary | ICD-10-CM | POA: Diagnosis not present

## 2019-09-27 DIAGNOSIS — M79671 Pain in right foot: Secondary | ICD-10-CM | POA: Diagnosis not present

## 2019-09-30 DIAGNOSIS — M25471 Effusion, right ankle: Secondary | ICD-10-CM | POA: Diagnosis not present

## 2019-09-30 DIAGNOSIS — M79672 Pain in left foot: Secondary | ICD-10-CM | POA: Diagnosis not present

## 2019-09-30 DIAGNOSIS — R262 Difficulty in walking, not elsewhere classified: Secondary | ICD-10-CM | POA: Diagnosis not present

## 2019-09-30 DIAGNOSIS — M79671 Pain in right foot: Secondary | ICD-10-CM | POA: Diagnosis not present

## 2019-10-04 DIAGNOSIS — M79672 Pain in left foot: Secondary | ICD-10-CM | POA: Diagnosis not present

## 2019-10-04 DIAGNOSIS — R262 Difficulty in walking, not elsewhere classified: Secondary | ICD-10-CM | POA: Diagnosis not present

## 2019-10-04 DIAGNOSIS — M79671 Pain in right foot: Secondary | ICD-10-CM | POA: Diagnosis not present

## 2019-10-04 DIAGNOSIS — M25471 Effusion, right ankle: Secondary | ICD-10-CM | POA: Diagnosis not present

## 2019-10-07 DIAGNOSIS — M79671 Pain in right foot: Secondary | ICD-10-CM | POA: Diagnosis not present

## 2019-10-07 DIAGNOSIS — M79672 Pain in left foot: Secondary | ICD-10-CM | POA: Diagnosis not present

## 2019-10-07 DIAGNOSIS — R262 Difficulty in walking, not elsewhere classified: Secondary | ICD-10-CM | POA: Diagnosis not present

## 2019-10-07 DIAGNOSIS — M25471 Effusion, right ankle: Secondary | ICD-10-CM | POA: Diagnosis not present

## 2019-10-11 ENCOUNTER — Ambulatory Visit: Payer: PPO | Admitting: Podiatry

## 2019-10-11 ENCOUNTER — Ambulatory Visit: Payer: PPO | Admitting: Sports Medicine

## 2019-10-12 DIAGNOSIS — M79671 Pain in right foot: Secondary | ICD-10-CM | POA: Diagnosis not present

## 2019-10-12 DIAGNOSIS — M79672 Pain in left foot: Secondary | ICD-10-CM | POA: Diagnosis not present

## 2019-10-12 DIAGNOSIS — M25471 Effusion, right ankle: Secondary | ICD-10-CM | POA: Diagnosis not present

## 2019-10-12 DIAGNOSIS — R262 Difficulty in walking, not elsewhere classified: Secondary | ICD-10-CM | POA: Diagnosis not present

## 2019-10-14 DIAGNOSIS — M79671 Pain in right foot: Secondary | ICD-10-CM | POA: Diagnosis not present

## 2019-10-14 DIAGNOSIS — M79672 Pain in left foot: Secondary | ICD-10-CM | POA: Diagnosis not present

## 2019-10-14 DIAGNOSIS — R262 Difficulty in walking, not elsewhere classified: Secondary | ICD-10-CM | POA: Diagnosis not present

## 2019-10-14 DIAGNOSIS — M25471 Effusion, right ankle: Secondary | ICD-10-CM | POA: Diagnosis not present

## 2019-10-16 DIAGNOSIS — R262 Difficulty in walking, not elsewhere classified: Secondary | ICD-10-CM | POA: Diagnosis not present

## 2019-10-16 DIAGNOSIS — M25471 Effusion, right ankle: Secondary | ICD-10-CM | POA: Diagnosis not present

## 2019-10-16 DIAGNOSIS — M79671 Pain in right foot: Secondary | ICD-10-CM | POA: Diagnosis not present

## 2019-10-16 DIAGNOSIS — M79672 Pain in left foot: Secondary | ICD-10-CM | POA: Diagnosis not present

## 2019-10-19 ENCOUNTER — Other Ambulatory Visit: Payer: Self-pay

## 2019-10-25 ENCOUNTER — Other Ambulatory Visit: Payer: Self-pay | Admitting: Family Medicine

## 2019-10-25 DIAGNOSIS — R262 Difficulty in walking, not elsewhere classified: Secondary | ICD-10-CM | POA: Diagnosis not present

## 2019-10-25 DIAGNOSIS — M79672 Pain in left foot: Secondary | ICD-10-CM | POA: Diagnosis not present

## 2019-10-25 DIAGNOSIS — M79671 Pain in right foot: Secondary | ICD-10-CM | POA: Diagnosis not present

## 2019-10-25 DIAGNOSIS — Z1231 Encounter for screening mammogram for malignant neoplasm of breast: Secondary | ICD-10-CM

## 2019-10-25 DIAGNOSIS — M25471 Effusion, right ankle: Secondary | ICD-10-CM | POA: Diagnosis not present

## 2019-10-28 DIAGNOSIS — M79671 Pain in right foot: Secondary | ICD-10-CM | POA: Diagnosis not present

## 2019-10-28 DIAGNOSIS — M25471 Effusion, right ankle: Secondary | ICD-10-CM | POA: Diagnosis not present

## 2019-10-28 DIAGNOSIS — R262 Difficulty in walking, not elsewhere classified: Secondary | ICD-10-CM | POA: Diagnosis not present

## 2019-10-28 DIAGNOSIS — M79672 Pain in left foot: Secondary | ICD-10-CM | POA: Diagnosis not present

## 2019-11-01 DIAGNOSIS — M79672 Pain in left foot: Secondary | ICD-10-CM | POA: Diagnosis not present

## 2019-11-01 DIAGNOSIS — M79671 Pain in right foot: Secondary | ICD-10-CM | POA: Diagnosis not present

## 2019-11-01 DIAGNOSIS — R262 Difficulty in walking, not elsewhere classified: Secondary | ICD-10-CM | POA: Diagnosis not present

## 2019-11-01 DIAGNOSIS — M25471 Effusion, right ankle: Secondary | ICD-10-CM | POA: Diagnosis not present

## 2019-11-04 DIAGNOSIS — M25471 Effusion, right ankle: Secondary | ICD-10-CM | POA: Diagnosis not present

## 2019-11-04 DIAGNOSIS — R262 Difficulty in walking, not elsewhere classified: Secondary | ICD-10-CM | POA: Diagnosis not present

## 2019-11-04 DIAGNOSIS — M79671 Pain in right foot: Secondary | ICD-10-CM | POA: Diagnosis not present

## 2019-11-04 DIAGNOSIS — M79672 Pain in left foot: Secondary | ICD-10-CM | POA: Diagnosis not present

## 2019-11-08 DIAGNOSIS — M79671 Pain in right foot: Secondary | ICD-10-CM | POA: Diagnosis not present

## 2019-11-08 DIAGNOSIS — M25471 Effusion, right ankle: Secondary | ICD-10-CM | POA: Diagnosis not present

## 2019-11-08 DIAGNOSIS — M79672 Pain in left foot: Secondary | ICD-10-CM | POA: Diagnosis not present

## 2019-11-08 DIAGNOSIS — R262 Difficulty in walking, not elsewhere classified: Secondary | ICD-10-CM | POA: Diagnosis not present

## 2019-11-10 DIAGNOSIS — M1711 Unilateral primary osteoarthritis, right knee: Secondary | ICD-10-CM | POA: Diagnosis not present

## 2019-11-11 DIAGNOSIS — M79671 Pain in right foot: Secondary | ICD-10-CM | POA: Diagnosis not present

## 2019-11-11 DIAGNOSIS — M25471 Effusion, right ankle: Secondary | ICD-10-CM | POA: Diagnosis not present

## 2019-11-11 DIAGNOSIS — M79672 Pain in left foot: Secondary | ICD-10-CM | POA: Diagnosis not present

## 2019-11-11 DIAGNOSIS — R262 Difficulty in walking, not elsewhere classified: Secondary | ICD-10-CM | POA: Diagnosis not present

## 2019-11-14 DIAGNOSIS — M79671 Pain in right foot: Secondary | ICD-10-CM | POA: Diagnosis not present

## 2019-11-14 DIAGNOSIS — M79672 Pain in left foot: Secondary | ICD-10-CM | POA: Diagnosis not present

## 2019-11-14 DIAGNOSIS — R262 Difficulty in walking, not elsewhere classified: Secondary | ICD-10-CM | POA: Diagnosis not present

## 2019-11-14 DIAGNOSIS — M25471 Effusion, right ankle: Secondary | ICD-10-CM | POA: Diagnosis not present

## 2019-11-16 DIAGNOSIS — M79671 Pain in right foot: Secondary | ICD-10-CM | POA: Diagnosis not present

## 2019-11-16 DIAGNOSIS — M79672 Pain in left foot: Secondary | ICD-10-CM | POA: Diagnosis not present

## 2019-11-16 DIAGNOSIS — M25471 Effusion, right ankle: Secondary | ICD-10-CM | POA: Diagnosis not present

## 2019-11-16 DIAGNOSIS — R262 Difficulty in walking, not elsewhere classified: Secondary | ICD-10-CM | POA: Diagnosis not present

## 2019-11-22 DIAGNOSIS — M79672 Pain in left foot: Secondary | ICD-10-CM | POA: Diagnosis not present

## 2019-11-22 DIAGNOSIS — R262 Difficulty in walking, not elsewhere classified: Secondary | ICD-10-CM | POA: Diagnosis not present

## 2019-11-22 DIAGNOSIS — M79671 Pain in right foot: Secondary | ICD-10-CM | POA: Diagnosis not present

## 2019-11-22 DIAGNOSIS — M25471 Effusion, right ankle: Secondary | ICD-10-CM | POA: Diagnosis not present

## 2019-11-23 DIAGNOSIS — H5713 Ocular pain, bilateral: Secondary | ICD-10-CM | POA: Diagnosis not present

## 2019-11-24 DIAGNOSIS — R262 Difficulty in walking, not elsewhere classified: Secondary | ICD-10-CM | POA: Diagnosis not present

## 2019-11-24 DIAGNOSIS — M79671 Pain in right foot: Secondary | ICD-10-CM | POA: Diagnosis not present

## 2019-11-24 DIAGNOSIS — M25471 Effusion, right ankle: Secondary | ICD-10-CM | POA: Diagnosis not present

## 2019-11-24 DIAGNOSIS — M79672 Pain in left foot: Secondary | ICD-10-CM | POA: Diagnosis not present

## 2019-11-28 DIAGNOSIS — M25471 Effusion, right ankle: Secondary | ICD-10-CM | POA: Diagnosis not present

## 2019-11-28 DIAGNOSIS — M79672 Pain in left foot: Secondary | ICD-10-CM | POA: Diagnosis not present

## 2019-11-28 DIAGNOSIS — R262 Difficulty in walking, not elsewhere classified: Secondary | ICD-10-CM | POA: Diagnosis not present

## 2019-11-28 DIAGNOSIS — M79671 Pain in right foot: Secondary | ICD-10-CM | POA: Diagnosis not present

## 2019-11-30 ENCOUNTER — Telehealth: Payer: Self-pay

## 2019-11-30 NOTE — Telephone Encounter (Signed)
Patient son called to advise patient has a facial sensitivity and is unsure if she may have caused some ear damage due to using cotton swabs.   Please follow up

## 2019-11-30 NOTE — Telephone Encounter (Signed)
I spoke with the pt's son [name], discussed pt had numbness in fact at last visit and pain in front of ear. He said this sensation is similar to before and she uses a q-tip to clean her ear. I advised we couldn't evaluate her over the phone and advise she be seen by primary care for this. I also advised against sticking anything in her ear as this can cause damage to the eardrum. I let him know if she has new numbness, weakness, any stroke-like symptoms that is an emergency and he should call 911. He verbalized appreciation.

## 2019-12-02 DIAGNOSIS — M25471 Effusion, right ankle: Secondary | ICD-10-CM | POA: Diagnosis not present

## 2019-12-02 DIAGNOSIS — M79671 Pain in right foot: Secondary | ICD-10-CM | POA: Diagnosis not present

## 2019-12-02 DIAGNOSIS — M79672 Pain in left foot: Secondary | ICD-10-CM | POA: Diagnosis not present

## 2019-12-02 DIAGNOSIS — R262 Difficulty in walking, not elsewhere classified: Secondary | ICD-10-CM | POA: Diagnosis not present

## 2019-12-05 DIAGNOSIS — M79672 Pain in left foot: Secondary | ICD-10-CM | POA: Diagnosis not present

## 2019-12-05 DIAGNOSIS — R262 Difficulty in walking, not elsewhere classified: Secondary | ICD-10-CM | POA: Diagnosis not present

## 2019-12-05 DIAGNOSIS — M25471 Effusion, right ankle: Secondary | ICD-10-CM | POA: Diagnosis not present

## 2019-12-05 DIAGNOSIS — M79671 Pain in right foot: Secondary | ICD-10-CM | POA: Diagnosis not present

## 2019-12-08 DIAGNOSIS — R262 Difficulty in walking, not elsewhere classified: Secondary | ICD-10-CM | POA: Diagnosis not present

## 2019-12-08 DIAGNOSIS — M79672 Pain in left foot: Secondary | ICD-10-CM | POA: Diagnosis not present

## 2019-12-08 DIAGNOSIS — M79671 Pain in right foot: Secondary | ICD-10-CM | POA: Diagnosis not present

## 2019-12-08 DIAGNOSIS — M25471 Effusion, right ankle: Secondary | ICD-10-CM | POA: Diagnosis not present

## 2019-12-12 DIAGNOSIS — M79671 Pain in right foot: Secondary | ICD-10-CM | POA: Diagnosis not present

## 2019-12-12 DIAGNOSIS — M25471 Effusion, right ankle: Secondary | ICD-10-CM | POA: Diagnosis not present

## 2019-12-12 DIAGNOSIS — R262 Difficulty in walking, not elsewhere classified: Secondary | ICD-10-CM | POA: Diagnosis not present

## 2019-12-12 DIAGNOSIS — M79672 Pain in left foot: Secondary | ICD-10-CM | POA: Diagnosis not present

## 2019-12-15 DIAGNOSIS — M79672 Pain in left foot: Secondary | ICD-10-CM | POA: Diagnosis not present

## 2019-12-15 DIAGNOSIS — M79671 Pain in right foot: Secondary | ICD-10-CM | POA: Diagnosis not present

## 2019-12-15 DIAGNOSIS — M25471 Effusion, right ankle: Secondary | ICD-10-CM | POA: Diagnosis not present

## 2019-12-15 DIAGNOSIS — R262 Difficulty in walking, not elsewhere classified: Secondary | ICD-10-CM | POA: Diagnosis not present

## 2019-12-16 ENCOUNTER — Other Ambulatory Visit: Payer: Self-pay

## 2019-12-16 ENCOUNTER — Ambulatory Visit (INDEPENDENT_AMBULATORY_CARE_PROVIDER_SITE_OTHER): Payer: PPO | Admitting: Podiatry

## 2019-12-16 ENCOUNTER — Encounter: Payer: Self-pay | Admitting: Podiatry

## 2019-12-16 DIAGNOSIS — B351 Tinea unguium: Secondary | ICD-10-CM

## 2019-12-16 DIAGNOSIS — E1149 Type 2 diabetes mellitus with other diabetic neurological complication: Secondary | ICD-10-CM | POA: Diagnosis not present

## 2019-12-16 DIAGNOSIS — M79675 Pain in left toe(s): Secondary | ICD-10-CM

## 2019-12-16 DIAGNOSIS — M79674 Pain in right toe(s): Secondary | ICD-10-CM | POA: Diagnosis not present

## 2019-12-19 DIAGNOSIS — R262 Difficulty in walking, not elsewhere classified: Secondary | ICD-10-CM | POA: Diagnosis not present

## 2019-12-19 DIAGNOSIS — M79672 Pain in left foot: Secondary | ICD-10-CM | POA: Diagnosis not present

## 2019-12-19 DIAGNOSIS — M79671 Pain in right foot: Secondary | ICD-10-CM | POA: Diagnosis not present

## 2019-12-19 DIAGNOSIS — M25471 Effusion, right ankle: Secondary | ICD-10-CM | POA: Diagnosis not present

## 2019-12-20 ENCOUNTER — Emergency Department (HOSPITAL_COMMUNITY): Payer: PPO

## 2019-12-20 ENCOUNTER — Other Ambulatory Visit: Payer: Self-pay | Admitting: Ophthalmology

## 2019-12-20 ENCOUNTER — Encounter (HOSPITAL_COMMUNITY): Payer: Self-pay | Admitting: Emergency Medicine

## 2019-12-20 ENCOUNTER — Other Ambulatory Visit: Payer: Self-pay

## 2019-12-20 ENCOUNTER — Emergency Department (HOSPITAL_COMMUNITY)
Admission: EM | Admit: 2019-12-20 | Discharge: 2019-12-20 | Disposition: A | Discharge status: Home / Self Care | Payer: PPO | Attending: Emergency Medicine | Admitting: Emergency Medicine

## 2019-12-20 DIAGNOSIS — I10 Essential (primary) hypertension: Secondary | ICD-10-CM | POA: Diagnosis not present

## 2019-12-20 DIAGNOSIS — Z79899 Other long term (current) drug therapy: Secondary | ICD-10-CM | POA: Insufficient documentation

## 2019-12-20 DIAGNOSIS — R900 Intracranial space-occupying lesion found on diagnostic imaging of central nervous system: Secondary | ICD-10-CM | POA: Insufficient documentation

## 2019-12-20 DIAGNOSIS — H4921 Sixth [abducent] nerve palsy, right eye: Secondary | ICD-10-CM | POA: Diagnosis not present

## 2019-12-20 DIAGNOSIS — Z973 Presence of spectacles and contact lenses: Secondary | ICD-10-CM | POA: Insufficient documentation

## 2019-12-20 DIAGNOSIS — R200 Anesthesia of skin: Secondary | ICD-10-CM | POA: Insufficient documentation

## 2019-12-20 DIAGNOSIS — G939 Disorder of brain, unspecified: Secondary | ICD-10-CM | POA: Diagnosis not present

## 2019-12-20 DIAGNOSIS — G9389 Other specified disorders of brain: Secondary | ICD-10-CM

## 2019-12-20 DIAGNOSIS — G9009 Other idiopathic peripheral autonomic neuropathy: Secondary | ICD-10-CM | POA: Diagnosis not present

## 2019-12-20 DIAGNOSIS — R7303 Prediabetes: Secondary | ICD-10-CM | POA: Diagnosis not present

## 2019-12-20 DIAGNOSIS — H5589 Other irregular eye movements: Secondary | ICD-10-CM | POA: Diagnosis present

## 2019-12-20 DIAGNOSIS — E669 Obesity, unspecified: Secondary | ICD-10-CM | POA: Diagnosis not present

## 2019-12-20 DIAGNOSIS — H4901 Third [oculomotor] nerve palsy, right eye: Secondary | ICD-10-CM | POA: Diagnosis not present

## 2019-12-20 DIAGNOSIS — H532 Diplopia: Secondary | ICD-10-CM

## 2019-12-20 LAB — BASIC METABOLIC PANEL
Anion gap: 8 (ref 5–15)
BUN: 6 mg/dL — ABNORMAL LOW (ref 8–23)
CO2: 23 mmol/L (ref 22–32)
Calcium: 9 mg/dL (ref 8.9–10.3)
Chloride: 110 mmol/L (ref 98–111)
Creatinine, Ser: 0.79 mg/dL (ref 0.44–1.00)
GFR calc Af Amer: >60 mL/min (ref >60)
GFR calc non Af Amer: >60 mL/min (ref >60)
Glucose, Bld: 70 mg/dL (ref 70–99)
Potassium: 3.2 mmol/L — ABNORMAL LOW (ref 3.5–5.1)
Sodium: 141 mmol/L (ref 135–145)

## 2019-12-20 MED ORDER — GADOBUTROL 1 MMOL/ML IV SOLN
7.0000 mL | Freq: Once | INTRAVENOUS | Status: AC | PRN
  Administered 2019-12-20: 7 mL via INTRAVENOUS

## 2019-12-20 NOTE — ED Notes (Signed)
Pt back from MRI 

## 2019-12-20 NOTE — ED Notes (Signed)
OK to give food and drink per RN.

## 2019-12-20 NOTE — ED Notes (Signed)
Patient ambulated to bathroom with steady gait and one assist

## 2019-12-20 NOTE — ED Provider Notes (Signed)
[facility] Provider Note   CSN: [id number] Arrival date & time: 12/20/19  1315     History Chief Complaint  Patient presents with   sent by doc    Lauren Santana is a 69 y.o. female.  Presents to the emergency department after referred from ophthalmology.  Dr. Satira Sark concerned about sixth nerve palsy, possible third nerve palsy and recommended come to ER for MRI brain and orbit for further eval.  On discussion with patient, states that she has previously been evaluated by neurology for similar issue and had prior MRI that was negative.  She states that the appointment today with ophthalmologist was a routine appointment.  She does not have any new acute complaints today.  She does not have any double vision, no blurry vision.  States that she has had cataract surgery on the right eye before.  No new injury.  No numbness weakness, no tingling, no speech change.     Completed chart review, neurology note in April concern for right facial numbness, ESR was elevated but felt to be unlikely GCA.  MRI no acute infarct, did demonstrate chronic microvascular ischemic changes.  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   S/P BSO (bilateral salpingo-oophorectomy) 01/08/2017   S/P hysterectomy 01/08/2017   Tachycardia    Acute deep vein thrombosis (DVT) of right lower extremity ([place]) 02/14/2016   Pulmonary emboli ([place]) 02/13/2016   PE (pulmonary embolism) 02/13/2016   AKI (acute kidney injury) ([place]) 02/13/2016   Pulmonary embolism with acute cor pulmonale ([facility])    Pelvic mass in female    Morbid obesity with BMI of 40.0-44.9, adult ([place]) 08/20/2015   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   Essential hypertension, benign  07/23/2014   Edema 07/23/2014   Dyslipidemia 07/23/2014   Lower extremity numbness 07/23/2014   Arthritis of knee 07/17/2014   Postmenopausal bleeding 03/10/2012   Endometriosis of pelvis 01/22/2007    Past Surgical History:  Procedure Laterality Date   ABDOMINAL HYSTERECTOMY     and BSO   CATARACT EXTRACTION Left 10/2018   CESAREAN SECTION  1969, 1971   COLONOSCOPY     HERNIA REPAIR  2001   incisional   MULTIPLE TOOTH EXTRACTIONS  2011   neck mass removal     "fatty tissue, not cancerous" per pt's son   TOTAL KNEE ARTHROPLASTY Left 07/17/2014   Procedure: TOTAL KNEE ARTHROPLASTY;  Surgeon: Kerin Salen, MD;  Location: [facility];  Service: Orthopedics;  Laterality: Left;     OB History    Gravida  4   Para  3   Term  3   Preterm      AB  1   Living  2     SAB  1   TAB      Ectopic      Multiple      Live Births              Family History  Problem Relation Age of Onset   Colon cancer Sister    Cancer Sister        unsure of type of cancer    Social History  Tobacco Use   Smoking status: Never Smoker   Smokeless tobacco: Never Used  Substance Use Topics   Alcohol use: No   Drug use: No    Home Medications Prior to Admission medications   Medication Sig Start Date End Date Taking? Authorizing Provider  atorvastatin (LIPITOR) 10 MG tablet TAKE ONE TABLET BY MOUTH  ONCE DAILY IN THE EVENING Patient taking differently: Take 10 mg by mouth every evening.  03/18/16   Gildardo Cranker, DO  diphenhydrAMINE (BENADRYL) 25 MG tablet Take 25 mg by mouth every 6 (six) hours as needed for itching or allergies.    [provider]  furosemide (LASIX) 20 MG tablet Take 1 tablet (20 mg total) by mouth daily. 02/16/16   Bonnielee Haff, MD  gabapentin (NEURONTIN) 300 MG capsule Take 300 mg by mouth 3 (three) times daily.    [provider]  indapamide (LOZOL) 2.5 MG tablet Take 2.5 mg by mouth daily.    [provider]   KLOR-CON M10 10 MEQ tablet TAKE ONE TABLET BY MOUTH ONCE DAILY Patient taking differently: Take 10 mEq by mouth daily.  08/24/14   Lauree Chandler, NP  labetalol (NORMODYNE) 100 MG tablet Take 100 mg by mouth 2 (two) times daily.    [provider]  moxifloxacin (VIGAMOX) 0.5 % ophthalmic solution INSTILL 1 DROP IN RIGHT EYE 4X DAILY FOR 1 WEEK 06/09/19   [provider]  traMADol (ULTRAM) 50 MG tablet Take 50 mg by mouth 2 (two) times daily. 08/06/15   [provider]    Allergies    Tylenol [acetaminophen], Sulfa antibiotics, and Sulfasalazine  Review of Systems   Review of Systems  Constitutional: Negative for chills and fever.  HENT: Negative for ear pain and sore throat.   Eyes: Negative for pain and visual disturbance.  Respiratory: Negative for cough and shortness of breath.   Cardiovascular: Negative for chest pain and palpitations.  Gastrointestinal: Negative for abdominal pain and vomiting.  Genitourinary: Negative for dysuria and hematuria.  Musculoskeletal: Negative for arthralgias and back pain.  Skin: Negative for color change and rash.  Neurological: Negative for seizures and syncope.  All other systems reviewed and are negative.   Physical Exam Updated Vital Signs BP (!) 134/51 (BP Location: Left Arm)    Pulse 76    Temp 99.1 F (37.3 C) (Oral)    Resp 16    SpO2 99%   Physical Exam Vitals and nursing note reviewed.  Constitutional:      General: She is not in acute distress.    Appearance: She is well-developed.  HENT:     Head: Normocephalic and atraumatic.  Eyes:     Conjunctiva/sclera: Conjunctivae normal.  Cardiovascular:     Rate and Rhythm: Normal rate and regular rhythm.     Heart sounds: No murmur.  Pulmonary:     Effort: Pulmonary effort is normal. No respiratory distress.     Breath sounds: Normal breath sounds.  Abdominal:     Palpations: Abdomen is soft.     Tenderness: There is no abdominal tenderness.    Musculoskeletal:     Cervical back: Neck supple.  Skin:    General: Skin is warm and dry.  Neurological:     Mental Status: She is alert and oriented to person, place, and time.     GCS: GCS eye subscore is 4. GCS verbal subscore is 5. GCS motor subscore is 6.     Sensory: Sensation is intact.  Motor: Motor function is intact. No weakness.     Coordination: Coordination is intact. Finger-Nose-Finger Test normal.     Gait: Gait is intact.     Comments: CN II-XII intact, except noted mild gaze difficulty on lateral gaze of right eye but otherwise normal EOM     ED Results / Procedures / Treatments   Labs (all labs ordered are listed, but only abnormal results are displayed) Labs Reviewed  BASIC METABOLIC PANEL - Abnormal; Notable for the following components:      Result Value   Potassium 3.2 (*)    BUN 6 (*)    All other components within normal limits    EKG None  Radiology MR BRAIN W WO CONTRAST  Result Date: 12/20/2019 CLINICAL DATA:  69 year old female status post recent cataract surgery. Abnormal eye movement on ophthalmic follow-up today, limitation of right eye medial and lateral ocular movements on physical exam in the ED. The patient reports chronic right facial numbness. EXAM: MRI HEAD AND ORBITS WITHOUT AND WITH CONTRAST TECHNIQUE: Multiplanar, multiecho pulse sequences of the brain and surrounding structures were obtained without and with intravenous contrast. Multiplanar, multiecho pulse sequences of the orbits and surrounding structures were obtained including fat saturation techniques, before and after intravenous contrast administration. CONTRAST:  40m GADAVIST GADOBUTROL 1 MMOL/ML IV SOLN COMPARISON:  Brain MRI 01/14/2019. FINDINGS: MRI HEAD FINDINGS Brain: No restricted diffusion to suggest acute infarction. No midline shift, ventriculomegaly, extra-axial fluid collection or acute intracranial hemorrhage. Cervicomedullary junction and pituitary are within normal  limits. Widespread patchy and confluent bilateral cerebral white matter T2 and FLAIR hyperintensity appears stable since last February. Frontal and parietal lobes primarily are affected in a nonspecific configuration. No superimposed cortical encephalomalacia identified. There is a chronic microhemorrhage in the right deep cerebellar nuclei on series 11, image 17, unchanged. No other chronic blood products identified. Minimal T2 heterogeneity in the deep gray nuclei is stable. Brainstem appear stable and negative. Abnormal enhancement of the right cavernous sinus contiguous with the right V2 and V3 nerves (series 17, images 18 and 20) and also the right maxillary sinus - detailed below. Outside of the right skull base no abnormal intracranial enhancement is identified. No other dural thickening. Vascular: Major intracranial vascular flow voids are stable since 2020, dominant left vertebral artery. The major dural venous sinuses are enhancing and appear to be patent. Skull and upper cervical spine: Generalized abnormal bone and marrow appearance of the right maxillary sinus. See series 5, image 8. See additional details below. The clivus seems to remain normal. Skull bone marrow signal elsewhere is within normal limits. Partially visible cervical spine degeneration including chronic ligamentous hypertrophy about the odontoid. Other: Visible internal auditory structures appear normal. Normal stylomastoid foramina. 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 The right maxillary sinus walls are than circumferentially infiltrated by enhancing tumor which is also around 7 millimeters  thick. The right inferior turbinate is probably also infiltrated as seen on series 9, image 13. The right V3 nerve seems to normalize proximal to the inferior alveolar foramen (on the head images above). The right V2 nerve remains abnormal into the infraorbital nerve, and is contiguous with the abnormal right maxillary  sinus as seen on series 9, image 14. These abnormalities appear very similar to the head MRI on 01/14/2019 (series 17, image 21 of that exam) with only mild progression evidence since that time. The right orbital apex is involved but there is no right intraorbital inflammation or discrete mass. The right extraocular muscles, lacrimal gland and globe appear to remain normal. The right optic nerve appears to remain normal. The medial aspect of the right cavernous sinus seems spared on series 14, image 3. The pituitary and suprasellar cistern are spared. The left cavernous sinus is spared. The optic chiasm is normal. Some of the right pterygoid musculature is also infiltrated. The left orbit appears normal. Visualized sinuses: Diffusely abnormal walls of the right maxillary sinus, although the sinus remains partially pneumatized. The other paranasal sinuses appear to be normal. Soft tissues: The deep right face abnormality does not appear to extend to the skin surface. The right nasopharynx seems spared. IMPRESSION: 1. 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 The right maxilla involvement in particular suggests malignancy, although there seems to be only indolent progression since February 2020. A carcinoma therefore seems unlikely. Lymphoma is a consideration. Malignant degeneration of a chronic nerve sheath tumor might  be possible. Infiltrative meningioma is a consideration although the pattern of maxilla involvement would be atypical. Biopsy of the maxilla might be the next best step. 2. The right orbital apex is affected, but there is no other intraorbital involvement. 3. No brain parenchymal involvement or cerebral edema. No acute infarct. Chronically advanced but nonspecific cerebral white matter signal changes. Electronically Signed   By: Genevie Ann M.D.   On: 12/20/2019 19:48   MR ORBITS W WO CONTRAST  Result Date: 12/20/2019 CLINICAL DATA:  69 year old female status post recent cataract surgery. Abnormal eye movement on ophthalmic follow-up today, limitation of right eye medial and lateral ocular movements on physical exam in the ED. The patient reports chronic right facial numbness. EXAM: MRI HEAD AND ORBITS WITHOUT AND WITH CONTRAST TECHNIQUE: Multiplanar, multiecho pulse sequences of the brain and surrounding structures were obtained without and with intravenous contrast. Multiplanar, multiecho pulse sequences of the orbits and surrounding structures were obtained including fat saturation techniques, before and after intravenous contrast administration. CONTRAST:  91m GADAVIST GADOBUTROL 1 MMOL/ML IV SOLN COMPARISON:  Brain MRI 01/14/2019. FINDINGS: MRI HEAD FINDINGS Brain: No restricted diffusion to suggest acute infarction. No midline shift, ventriculomegaly, extra-axial fluid collection or acute intracranial hemorrhage. Cervicomedullary junction and pituitary are within normal limits. Widespread patchy and confluent bilateral cerebral white matter T2 and FLAIR hyperintensity appears stable since last February. Frontal and parietal lobes primarily are affected in a nonspecific configuration. No superimposed cortical encephalomalacia identified. There is a chronic microhemorrhage in the right deep cerebellar nuclei on series 11, image 17, unchanged. No other chronic blood products identified. Minimal T2 heterogeneity  in the deep gray nuclei is stable. Brainstem appear stable and negative. Abnormal enhancement of the right cavernous sinus contiguous with the right V2 and V3 nerves (series 17, images 18 and 20) and also the right maxillary sinus - detailed below. Outside of the right skull base no abnormal intracranial enhancement is identified. No other dural thickening. Vascular: Major intracranial vascular flow voids are stable since 2020, dominant  left vertebral artery. The major dural venous sinuses are enhancing and appear to be patent. Skull and upper cervical spine: Generalized abnormal bone and marrow appearance of the right maxillary sinus. See series 5, image 8. See additional details below. The clivus seems to remain normal. Skull bone marrow signal elsewhere is within normal limits. Partially visible cervical spine degeneration including chronic ligamentous hypertrophy about the odontoid. Other: Visible internal auditory structures appear normal. Normal stylomastoid foramina. 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 The right maxillary sinus walls are than circumferentially infiltrated by enhancing tumor which is also around 7 millimeters thick. The right inferior turbinate is probably also infiltrated as seen on series 9, image 13. The right V3 nerve seems to normalize proximal to the inferior alveolar foramen (on the head images above). The right V2 nerve remains abnormal into the infraorbital nerve, and is contiguous with the abnormal right maxillary sinus as seen on series 9, image 14. These abnormalities appear very similar to the head MRI on 01/14/2019 (series 17, image 21  of that exam) with only mild progression evidence since that time. The right orbital apex is involved but there is no right intraorbital inflammation or discrete mass. The right extraocular muscles, lacrimal gland and globe appear to remain normal. The right optic nerve appears to remain normal. The medial aspect of the right cavernous sinus seems spared on series 14, image 3. The pituitary and suprasellar cistern are spared. The left cavernous sinus is spared. The optic chiasm is normal. Some of the right pterygoid musculature is also infiltrated. The left orbit appears normal. Visualized sinuses: Diffusely abnormal walls of the right maxillary sinus, although the sinus remains partially pneumatized. The other paranasal sinuses appear to be normal. Soft tissues: The deep right face abnormality does not appear to extend to the skin surface. The right nasopharynx seems spared. IMPRESSION: 1. 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 The right maxilla involvement in particular suggests malignancy, although there seems to be only indolent progression since February 2020. A carcinoma therefore seems unlikely. Lymphoma is a consideration. Malignant degeneration of a chronic nerve sheath tumor might be possible. Infiltrative meningioma is a consideration although the pattern of maxilla involvement would be atypical. Biopsy of the maxilla might be the next best step. 2. The right orbital apex is affected, but there is no other intraorbital involvement. 3. No brain parenchymal involvement or cerebral edema. No acute infarct. Chronically advanced but nonspecific cerebral white matter signal changes. Electronically Signed   By: Genevie Ann M.D.    On: 12/20/2019 19:48    Procedures Procedures (including critical care time)  Medications Ordered in ED Medications  gadobutrol (GADAVIST) 1 MMOL/ML injection 7 mL (7 mLs Intravenous Contrast Given 12/20/19 1845)    ED Course  I have reviewed the triage vital signs and the nursing notes.  Pertinent labs & imaging results that were available during my care of the patient were reviewed  by me and considered in my medical decision making (see chart for details).  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  Patient actually had no symptoms and did not know anything was wrong, but this was picked up by her ophthalmologist.  MRI brain and orbits were obtained per their recommendations.  Demonstrated skull base, right cavernous sinus mass.  Reviewed findings with neurosurgery on-call, recommended outpatient follow-up with Dr. Venetia Constable.  Reviewed this with patient, additionally recommended primary care follow-up as well.  Believe appropriate for discharge and outpatient management at this time.  After the discussed management above, the patient was determined to be safe for discharge.  The patient was in agreement with this plan and all questions regarding their care were answered.  ED return precautions were discussed and the patient will return to the ED with any significant worsening of condition.   Final Clinical Impression(s) / ED Diagnoses Final diagnoses:  Brain mass    Rx / DC Orders ED Discharge Orders    None       Lucrezia Starch, MD 12/21/19 0000

## 2019-12-20 NOTE — Discharge Instructions (Signed)
The MRI showed a brain mass.  You will need to follow-up with the neurosurgery specialist, Dr. Venetia Constable.  Please call their office tomorrow morning to get an appointment.  Please also follow-up with your primary doctor as previously scheduled.  Return to ER if you develop passing out, numbness, weakness, vision changes.

## 2019-12-20 NOTE — ED Triage Notes (Signed)
Pt arrives from eye doctor with possible of palsy. They are recommending MRI brain and orbits. Eye doctor noted limitations to abduction and adduction. Pt denies denies vision trouble.

## 2019-12-20 NOTE — ED Provider Notes (Signed)
MSE was initiated and I personally evaluated the patient and placed orders (if any) at  2:36 PM on December 20, 2019.  The patient appears stable so that the remainder of the MSE may be completed by another provider.  She said she was at her eye doctors today Dr. Satira Sark and they noticed that she had problems with her right eye extraocular movements.  She is sent here to get an MRI of brain and orbit.  Patient himself is not sure why she is here.  She is not sure if she has double vision.  She has had right-sided facial numbness for at least 6 months.  On exam she seems to have limitation of both medial and lateral extraocular movements on the right eye.   Hayden Rasmussen, MD 12/20/19 1736

## 2019-12-20 NOTE — ED Notes (Signed)
Patient verbalizes understanding of discharge instructions. Opportunity for questioning and answers were provided. Armband removed by staff, pt discharged from ED via wheelchair.  

## 2019-12-21 DIAGNOSIS — I10 Essential (primary) hypertension: Secondary | ICD-10-CM | POA: Diagnosis not present

## 2019-12-23 DIAGNOSIS — I10 Essential (primary) hypertension: Secondary | ICD-10-CM | POA: Diagnosis not present

## 2019-12-23 DIAGNOSIS — M25471 Effusion, right ankle: Secondary | ICD-10-CM | POA: Diagnosis not present

## 2019-12-23 DIAGNOSIS — M79672 Pain in left foot: Secondary | ICD-10-CM | POA: Diagnosis not present

## 2019-12-23 DIAGNOSIS — R262 Difficulty in walking, not elsewhere classified: Secondary | ICD-10-CM | POA: Diagnosis not present

## 2019-12-23 DIAGNOSIS — M79671 Pain in right foot: Secondary | ICD-10-CM | POA: Diagnosis not present

## 2019-12-23 DIAGNOSIS — Z6839 Body mass index (BMI) 39.0-39.9, adult: Secondary | ICD-10-CM | POA: Diagnosis not present

## 2019-12-23 DIAGNOSIS — D496 Neoplasm of unspecified behavior of brain: Secondary | ICD-10-CM | POA: Diagnosis not present

## 2019-12-25 DIAGNOSIS — I10 Essential (primary) hypertension: Secondary | ICD-10-CM | POA: Diagnosis not present

## 2019-12-25 DIAGNOSIS — E1169 Type 2 diabetes mellitus with other specified complication: Secondary | ICD-10-CM | POA: Diagnosis not present

## 2019-12-25 DIAGNOSIS — M159 Polyosteoarthritis, unspecified: Secondary | ICD-10-CM | POA: Diagnosis not present

## 2019-12-25 DIAGNOSIS — E785 Hyperlipidemia, unspecified: Secondary | ICD-10-CM | POA: Diagnosis not present

## 2019-12-26 NOTE — Progress Notes (Signed)
Subjective: 69 y.o. returns the office today for painful, elongated, thickened toenails which she cannot trim herself. Denies any redness or drainage around the nails.  She feels the neuropathy has been doing better with physical therapy.  He has not been worsening.  Denies any systemic complaints such as fevers, chills, nausea, vomiting.   PCP: Lucianne Lei, MD  Objective: AAO 3, NAD DP/PT pulses palpable, CRT less than 3 seconds Nails hypertrophic, dystrophic, elongated, brittle, discolored 10. There is tenderness overlying the nails 1-5 bilaterally. There is no surrounding erythema or drainage along the nail sites. No open lesions or pre-ulcerative lesions are identified. No pain with calf compression, swelling, warmth, erythema.  Assessment: Patient presents with symptomatic onychomycosis; neuropathy  Plan: -Treatment options including alternatives, risks, complications were discussed -Nails sharply debrided 10 without complication/bleeding. -She is still doing physical therapy.  We will continue with this as she feels this has been helpful. -Discussed daily foot inspection. If there are any changes, to call the office immediately.  -Follow-up in 3 months or sooner if any problems are to arise. In the meantime, encouraged to call the office with any questions, concerns, changes symptoms.  Celesta Gentile, DPM

## 2019-12-27 DIAGNOSIS — M79671 Pain in right foot: Secondary | ICD-10-CM | POA: Diagnosis not present

## 2019-12-27 DIAGNOSIS — R634 Abnormal weight loss: Secondary | ICD-10-CM | POA: Diagnosis not present

## 2019-12-27 DIAGNOSIS — M25471 Effusion, right ankle: Secondary | ICD-10-CM | POA: Diagnosis not present

## 2019-12-27 DIAGNOSIS — K5900 Constipation, unspecified: Secondary | ICD-10-CM | POA: Diagnosis not present

## 2019-12-27 DIAGNOSIS — R262 Difficulty in walking, not elsewhere classified: Secondary | ICD-10-CM | POA: Diagnosis not present

## 2019-12-27 DIAGNOSIS — R112 Nausea with vomiting, unspecified: Secondary | ICD-10-CM | POA: Diagnosis not present

## 2019-12-27 DIAGNOSIS — M79672 Pain in left foot: Secondary | ICD-10-CM | POA: Diagnosis not present

## 2019-12-28 ENCOUNTER — Other Ambulatory Visit: Payer: Self-pay | Admitting: Gastroenterology

## 2019-12-28 DIAGNOSIS — R112 Nausea with vomiting, unspecified: Secondary | ICD-10-CM

## 2019-12-30 ENCOUNTER — Telehealth: Payer: Self-pay | Admitting: *Deleted

## 2019-12-30 DIAGNOSIS — M79672 Pain in left foot: Secondary | ICD-10-CM | POA: Diagnosis not present

## 2019-12-30 DIAGNOSIS — M25471 Effusion, right ankle: Secondary | ICD-10-CM | POA: Diagnosis not present

## 2019-12-30 DIAGNOSIS — R262 Difficulty in walking, not elsewhere classified: Secondary | ICD-10-CM | POA: Diagnosis not present

## 2019-12-30 DIAGNOSIS — M79671 Pain in right foot: Secondary | ICD-10-CM | POA: Diagnosis not present

## 2019-12-30 NOTE — Telephone Encounter (Signed)
Called the [employer]) and stated that [facility] sent a fax back over stating that the supplies are not covered by the insurance and son stated that the patient was wanting diabetic shoes and I stated that I would look into that and see what we could do for the patient. Lauren Santana

## 2020-01-03 DIAGNOSIS — M79672 Pain in left foot: Secondary | ICD-10-CM | POA: Diagnosis not present

## 2020-01-03 DIAGNOSIS — M79671 Pain in right foot: Secondary | ICD-10-CM | POA: Diagnosis not present

## 2020-01-03 DIAGNOSIS — M25471 Effusion, right ankle: Secondary | ICD-10-CM | POA: Diagnosis not present

## 2020-01-03 DIAGNOSIS — R262 Difficulty in walking, not elsewhere classified: Secondary | ICD-10-CM | POA: Diagnosis not present

## 2020-01-04 ENCOUNTER — Ambulatory Visit
Admission: RE | Admit: 2020-01-04 | Discharge: 2020-01-04 | Disposition: A | Discharge status: Home / Self Care | Payer: PPO | Source: Ambulatory Visit | Attending: Gastroenterology | Admitting: Gastroenterology

## 2020-01-04 ENCOUNTER — Other Ambulatory Visit: Payer: Self-pay | Admitting: Otolaryngology

## 2020-01-04 DIAGNOSIS — K7689 Other specified diseases of liver: Secondary | ICD-10-CM | POA: Diagnosis not present

## 2020-01-04 DIAGNOSIS — R112 Nausea with vomiting, unspecified: Secondary | ICD-10-CM

## 2020-01-04 DIAGNOSIS — K8020 Calculus of gallbladder without cholecystitis without obstruction: Secondary | ICD-10-CM | POA: Diagnosis not present

## 2020-01-06 DIAGNOSIS — D164 Benign neoplasm of bones of skull and face: Secondary | ICD-10-CM | POA: Diagnosis not present

## 2020-01-06 DIAGNOSIS — D496 Neoplasm of unspecified behavior of brain: Secondary | ICD-10-CM | POA: Diagnosis not present

## 2020-01-10 DIAGNOSIS — M79671 Pain in right foot: Secondary | ICD-10-CM | POA: Diagnosis not present

## 2020-01-10 DIAGNOSIS — M25471 Effusion, right ankle: Secondary | ICD-10-CM | POA: Diagnosis not present

## 2020-01-10 DIAGNOSIS — M79672 Pain in left foot: Secondary | ICD-10-CM | POA: Diagnosis not present

## 2020-01-10 DIAGNOSIS — R262 Difficulty in walking, not elsewhere classified: Secondary | ICD-10-CM | POA: Diagnosis not present

## 2020-01-11 ENCOUNTER — Other Ambulatory Visit: Payer: PPO

## 2020-01-13 DIAGNOSIS — R262 Difficulty in walking, not elsewhere classified: Secondary | ICD-10-CM | POA: Diagnosis not present

## 2020-01-13 DIAGNOSIS — M79672 Pain in left foot: Secondary | ICD-10-CM | POA: Diagnosis not present

## 2020-01-13 DIAGNOSIS — M25471 Effusion, right ankle: Secondary | ICD-10-CM | POA: Diagnosis not present

## 2020-01-13 DIAGNOSIS — M79671 Pain in right foot: Secondary | ICD-10-CM | POA: Diagnosis not present

## 2020-01-16 DIAGNOSIS — E1169 Type 2 diabetes mellitus with other specified complication: Secondary | ICD-10-CM | POA: Diagnosis not present

## 2020-01-16 DIAGNOSIS — I10 Essential (primary) hypertension: Secondary | ICD-10-CM | POA: Diagnosis not present

## 2020-01-16 DIAGNOSIS — F064 Anxiety disorder due to known physiological condition: Secondary | ICD-10-CM | POA: Diagnosis not present

## 2020-01-16 DIAGNOSIS — E876 Hypokalemia: Secondary | ICD-10-CM | POA: Diagnosis not present

## 2020-01-16 NOTE — Pre-Procedure Instructions (Signed)
Your procedure is scheduled on Friday, February 26th, from 09:48 AM to 11:18 AM.  Report to [facility] Entrance "A" at 07:45 A.M., and check in at the Admitting office.  Call this number if you have problems the morning of surgery:  [phone]  Call [phone] if you have any questions prior to your surgery date Monday-Friday 8am-4pm.    Remember:  Do not eat or drink after midnight the night before your surgery.     Take these medicines the morning of surgery with A SIP OF WATER : gabapentin (NEURONTIN) labetalol (NORMODYNE) traMADol (ULTRAM) medroxyPROGESTERone (PROVERA)   As of today, STOP taking any Aspirin (unless otherwise instructed by your surgeon), Aleve, Naproxen, Ibuprofen, Motrin, Advil, Goody's, BC's, all herbal medications, fish oil, and all vitamins.    The Morning of Surgery  Do not wear jewelry, make-up or nail polish.  Do not wear lotions, powders, perfumes, or deodorant  Do not shave 48 hours prior to surgery.   Do not bring valuables to the hospital.  [facility] is not responsible for any belongings or valuables.  If you are a smoker, DO NOT Smoke 24 hours prior to surgery  If you wear a CPAP at night please bring your mask the morning of surgery   Remember that you must have someone to transport you home after your surgery, and remain with you for 24 hours if you are discharged the same day.   Please bring cases for contacts, glasses, hearing aids, dentures or bridgework because it cannot be worn into surgery.    Leave your suitcase in the car.  After surgery it may be brought to your room.  For patients admitted to the hospital, discharge time will be determined by your treatment team.  Patients discharged the day of surgery will not be allowed to drive home.    Special instructions:   [facility]- Preparing For Surgery  Before surgery, you can play an important role. Because skin is not sterile, your skin needs to be as free of  germs as possible. You can reduce the number of germs on your skin by washing with CHG (chlorahexidine gluconate) Soap before surgery.  CHG is an antiseptic cleaner which kills germs and bonds with the skin to continue killing germs even after washing.    Oral Hygiene is also important to reduce your risk of infection.  Remember - BRUSH YOUR TEETH THE MORNING OF SURGERY WITH YOUR REGULAR TOOTHPASTE  Please do not use if you have an allergy to CHG or antibacterial soaps. If your skin becomes reddened/irritated stop using the CHG.  Do not shave (including legs and underarms) for at least 48 hours prior to first CHG shower. It is OK to shave your face.  Please follow these instructions carefully.   1. Shower the NIGHT BEFORE SURGERY and the MORNING OF SURGERY with CHG Soap.   2. If you chose to wash your hair, wash your hair first as usual with your normal shampoo.  3. After you shampoo, rinse your hair and body thoroughly to remove the shampoo.  4. Use CHG as you would any other liquid soap. You can apply CHG directly to the skin and wash gently with a scrungie or a clean washcloth.   5. Apply the CHG Soap to your body ONLY FROM THE NECK DOWN.  Do not use on open wounds or open sores. Avoid contact with your eyes, ears, mouth and genitals (private parts). Wash Face and genitals (private parts)  with your normal soap.   6. Wash thoroughly, paying special attention to the area where your surgery will be performed.  7. Thoroughly rinse your body with warm water from the neck down.  8. DO NOT shower/wash with your normal soap after using and rinsing off the CHG Soap.  9. Pat yourself dry with a CLEAN TOWEL.  10. Wear CLEAN PAJAMAS to bed the night before surgery, wear comfortable clothes the morning of surgery  11. Place CLEAN SHEETS on your bed the night of your first shower and DO NOT SLEEP WITH PETS.    Day of Surgery:   Remember to brush your teeth WITH YOUR REGULAR  TOOTHPASTE. Please shower the morning of surgery with the CHG soap Do not apply any deodorants/lotions. Please wear clean clothes to the hospital/surgery center.      Please read over the following fact sheets that you were given.

## 2020-01-16 NOTE — Progress Notes (Signed)
Patient scheduled for surgery with Dr. Wilburn Cornelia 01/20/20. No current procedure orders . PAT appointment 01/17/20. Called Dr. Victorio Palm office for orders.

## 2020-01-17 ENCOUNTER — Encounter (HOSPITAL_COMMUNITY)
Admission: RE | Admit: 2020-01-17 | Discharge: 2020-01-17 | Disposition: A | Discharge status: Home / Self Care | Payer: PPO | Source: Ambulatory Visit | Attending: Otolaryngology | Admitting: Otolaryngology

## 2020-01-17 ENCOUNTER — Other Ambulatory Visit: Payer: Self-pay

## 2020-01-17 ENCOUNTER — Encounter (HOSPITAL_COMMUNITY): Payer: Self-pay

## 2020-01-17 ENCOUNTER — Other Ambulatory Visit (HOSPITAL_COMMUNITY)
Admission: RE | Admit: 2020-01-17 | Discharge: 2020-01-17 | Disposition: A | Discharge status: Home / Self Care | Payer: PPO | Source: Ambulatory Visit | Attending: Otolaryngology | Admitting: Otolaryngology

## 2020-01-17 DIAGNOSIS — Z79899 Other long term (current) drug therapy: Secondary | ICD-10-CM | POA: Diagnosis not present

## 2020-01-17 DIAGNOSIS — Z01810 Encounter for preprocedural cardiovascular examination: Secondary | ICD-10-CM | POA: Diagnosis not present

## 2020-01-17 DIAGNOSIS — I10 Essential (primary) hypertension: Secondary | ICD-10-CM | POA: Insufficient documentation

## 2020-01-17 DIAGNOSIS — Z20822 Contact with and (suspected) exposure to covid-19: Secondary | ICD-10-CM | POA: Diagnosis not present

## 2020-01-17 DIAGNOSIS — R7303 Prediabetes: Secondary | ICD-10-CM | POA: Diagnosis not present

## 2020-01-17 DIAGNOSIS — Z01812 Encounter for preprocedural laboratory examination: Secondary | ICD-10-CM | POA: Insufficient documentation

## 2020-01-17 HISTORY — DX: Prediabetes: R73.03

## 2020-01-17 LAB — CBC
HCT: 38.3 % (ref 36.0–46.0)
Hemoglobin: 11.9 g/dL — ABNORMAL LOW (ref 12.0–15.0)
MCH: 30.5 pg (ref 26.0–34.0)
MCHC: 31.1 g/dL (ref 30.0–36.0)
MCV: 98.2 fL (ref 80.0–100.0)
Platelets: 115 10*3/uL — ABNORMAL LOW (ref 150–400)
RBC: 3.9 MIL/uL (ref 3.87–5.11)
RDW: 14 % (ref 11.5–15.5)
WBC: 5 10*3/uL (ref 4.0–10.5)
nRBC: 0 % (ref 0.0–0.2)

## 2020-01-17 LAB — BASIC METABOLIC PANEL
Anion gap: 12 (ref 5–15)
BUN: <5 mg/dL — ABNORMAL LOW (ref 8–23)
CO2: 25 mmol/L (ref 22–32)
Calcium: 9 mg/dL (ref 8.9–10.3)
Chloride: 106 mmol/L (ref 98–111)
Creatinine, Ser: 0.78 mg/dL (ref 0.44–1.00)
GFR calc Af Amer: >60 mL/min (ref >60)
GFR calc non Af Amer: >60 mL/min (ref >60)
Glucose, Bld: 75 mg/dL (ref 70–99)
Potassium: 3.1 mmol/L — ABNORMAL LOW (ref 3.5–5.1)
Sodium: 143 mmol/L (ref 135–145)

## 2020-01-17 LAB — SARS CORONAVIRUS 2 (TAT 6-24 HRS): SARS Coronavirus 2: NEGATIVE

## 2020-01-17 LAB — GLUCOSE, CAPILLARY: Glucose-Capillary: 78 mg/dL (ref 70–99)

## 2020-01-17 LAB — HEMOGLOBIN A1C
Hgb A1c MFr Bld: 5.1 % (ref 4.8–5.6)
Mean Plasma Glucose: 99.67 mg/dL

## 2020-01-17 NOTE — Progress Notes (Addendum)
PCP - Lucianne Lei, MD Cardiologist - Denies  PPM/ICD - Denies  Chest x-ray - N/A EKG - 01/17/20 Stress Test - Denies ECHO - 02/15/16 Cardiac Cath - Denies  Sleep Study - Denies  Patient denies being a diabetic.  Blood Thinner Instructions: N/A Aspirin Instructions: N/A  ERAS Protcol - N/A PRE-SURGERY Ensure or G2- N/A  COVID TEST- 01/17/20   Anesthesia review: Abnormal Labs; hx echo.  Patient denies shortness of breath, fever, cough and chest pain at PAT appointment   All instructions explained to the patient, with a verbal understanding of the material. Patient agrees to go over the instructions while at home for a better understanding. Patient also instructed to self quarantine after being tested for COVID-19. The opportunity to ask questions was provided.

## 2020-01-17 NOTE — Progress Notes (Signed)
Patient has abnormal labs: Platelets 115, Potassium 3.1. Patient was seen today for her pre-Admission Testing Appointment. She is scheduled for Endoscopic sinus surgery w/fusion, Caldwell-Luc approach on 01/20/20 with Dr. Wilburn Cornelia. Called and left VM to Dr. Victorio Palm Nurse's office about labs. Also [employer] Dr. Wilburn Cornelia concerning labs.

## 2020-01-18 NOTE — Progress Notes (Signed)
Anesthesia Chart Review:  Case: [id number] Date/Time: 01/20/20 0933   Procedures:      SINUS ENDO WITH FUSION (Right )     MAXILLARY ANTROSTOMY  WITH BIOPSY CALDWELL APPROACH (Right )   Anesthesia type: General   Pre-op diagnosis: Cavernous sinus tumor   Location: [facility] OR ROOM 09 / [place] [facility]   Surgeons: Jerrell Belfast, MD      DISCUSSION: Santana is a 69 year old female scheduled for Lauren above procedure. She was referred to ENT Dr. Zada Finders after 12/20/19 MRI (odered by ophthalmology when limited eye movement noted) showed "enhancing lesion in Lauren right cavernous sinus concerning for possible tumor".  Other history includes never smoker, HTN, hypercholesterolemia, pre-diabetes, LE edema, arthritis. BMI is consistent with obesity.  Presurgical COVID-19 test was negative on 01/17/2020.  Anesthesia team to evaluate on Lauren day of surgery.   VS: BP (!) 153/67   Pulse 77   Temp 37 C   Resp 18   Ht 5\' 1"  (1.549 m)   Wt 91.6 kg   SpO2 98%   BMI 38.17 kg/m    PROVIDERS: Lucianne Lei, MD his PCP Sarina Ill, MD is neurologist. Seen in April 2020 for right face paresthesias with elevated sed rate. Santana declined temporal artery biopsy, but treated with several weeks of steroids. No acute finding on MRI then (01/14/19).    LABS: Labs reviewed: Acceptable for surgery. PLT 115K, unchanged when compared to 02/15/16 lab. A1c 5.1%.  (all labs ordered are listed, but only abnormal results are displayed)  Labs Reviewed  BASIC METABOLIC PANEL - Abnormal; Notable for Lauren following components:      Result Value   Potassium 3.1 (*)    BUN <5 (*)    All other components within normal limits  CBC - Abnormal; Notable for Lauren following components:   Hemoglobin 11.9 (*)    Platelets 115 (*)    All other components within normal limits  GLUCOSE, CAPILLARY  HEMOGLOBIN A1C     IMAGES: CT paranasal sinuses 01/06/20 [facility]): IMPRESSION: 1. Demineralized walls of Lauren right maxillary sinus  corresponding to Lauren infiltrative, circumferentially thick and enhancing soft tissue replacement seen by MRI. Associated asymmetry of bone surrounding Lauren abnormal right pterygoid palatine fossa. Lauren abnormal bone mineralization continues anteriorly toward Lauren right maxillary alveolar process. 2. Minimal superimposed conventional paranasal sinus disease. Lauren right nasal cavity appears relatively spared.   MRI Brain/orbits 12/20/19: IMPRESSION: 1. There is a chronic infiltrative enhancing soft tissue mass of Lauren right skull base tracking from Lauren right cavernous sinus, along both Lauren right trigeminal V2 and V3 segments, throughout Lauren left infraorbital nerve, and diffusely infiltrating Lauren walls of Lauren right maxillary sinus and probably also Lauren right inferior turbinate. Lauren right maxilla involvement in particular suggests malignancy, although there seems to be only indolent progression since February 2020. A carcinoma therefore seems unlikely. Lymphoma is a consideration. Malignant degeneration of a chronic nerve sheath tumor might be possible. Infiltrative meningioma is a consideration although Lauren pattern of maxilla involvement would be atypical. Biopsy of Lauren maxilla might be Lauren next best step. 2. Lauren right orbital apex is affected, but there is no other intraorbital involvement. 3. No brain parenchymal involvement or cerebral edema. No acute infarct. Chronically advanced but nonspecific cerebral white matter signal changes.   EKG: 01/17/20: Normal sinus rhythm Normal ECG Borderline low voltage complexes. No significant change from 02/15/2016. Confirmed by Adrian Prows [phone]) on 01/17/2020 7:54:46 PM   CV: Echo 02/15/16: Study Conclusions  -  Left ventricle: Lauren cavity size was normal. Wall thickness was  increased in a pattern of moderate LVH. Systolic function was  vigorous. Lauren estimated ejection fraction was in Lauren range of 65%  to 70%. Wall motion was normal;  there were no regional wall  motion abnormalities. Lauren study is not technically sufficient to  allow evaluation of LV diastolic function.  - Right ventricle: Poorly visualized. Lauren cavity size was mildly  dilated. Wall thickness was normal. Systolic function was  moderately reduced.  - Pulmonary arteries: Systolic pressure was mildly increased.    Past Medical History:  Diagnosis Date  . Arthritis   . Edema    B/LLE  . High cholesterol   . Hypertension   . Numbness    B/LLE  . Pre-diabetes   . Wears glasses     Past Surgical History:  Procedure Laterality Date  . ABDOMINAL HYSTERECTOMY     and BSO  . CATARACT EXTRACTION Left 10/2018  . [place]  . COLONOSCOPY    . EYE SURGERY Bilateral    CATARACT SX  . HERNIA REPAIR  2001   incisional  . JOINT REPLACEMENT Left    Knee  . MULTIPLE TOOTH EXTRACTIONS  2011  . neck mass removal     "fatty tissue, not cancerous" per pt's son  . TOTAL KNEE ARTHROPLASTY Left 07/17/2014   Procedure: TOTAL KNEE ARTHROPLASTY;  Surgeon: Kerin Salen, MD;  Location: [facility];  Service: Orthopedics;  Laterality: Left;    MEDICATIONS: . atorvastatin (LIPITOR) 10 MG tablet  . diphenhydrAMINE (BENADRYL) 25 MG tablet  . furosemide (LASIX) 20 MG tablet  . gabapentin (NEURONTIN) 300 MG capsule  . labetalol (NORMODYNE) 100 MG tablet  . medroxyPROGESTERone (PROVERA) 10 MG tablet  . potassium chloride 20 MEQ/15ML (10%) SOLN  . sucralfate (CARAFATE) 1 GM/10ML suspension  . traMADol (ULTRAM) 50 MG tablet   No current facility-administered medications for this encounter.    Myra Gianotti, PA-C Surgical Short Stay/Anesthesiology [facility] Phone [phone] [facility] Phone [phone] 01/18/2020 3:52 PM

## 2020-01-18 NOTE — Anesthesia Preprocedure Evaluation (Addendum)
Anesthesia Evaluation  Patient identified by MRN, date of birth, ID band Patient awake    Reviewed: Allergy & Precautions, H&P , NPO status , Patient's Chart, lab work & pertinent test results, reviewed documented beta blocker date and time   Airway Mallampati: II  TM Distance: >3 FB Neck ROM: Full    Dental no notable dental hx. (+) Edentulous Upper, Edentulous Lower, Dental Advisory Given   Pulmonary neg pulmonary ROS,    Pulmonary exam normal breath sounds clear to auscultation       Cardiovascular hypertension, Pt. on medications and Pt. on home beta blockers negative cardio ROS   Rhythm:Regular Rate:Normal     Neuro/Psych negative neurological ROS  negative psych ROS   GI/Hepatic negative GI ROS, Neg liver ROS,   Endo/Other  Morbid obesity  Renal/GU negative Renal ROS  negative genitourinary   Musculoskeletal  (+) Arthritis , Osteoarthritis,    Abdominal   Peds  Hematology negative hematology ROS (+)   Anesthesia Other Findings   Reproductive/Obstetrics negative OB ROS                           Anesthesia Physical Anesthesia Plan  ASA: III  Anesthesia Plan: General   Post-op Pain Management:    Induction: Intravenous  PONV Risk Score and Plan: 4 or greater and Ondansetron, Dexamethasone and Midazolam  Airway Management Planned: Oral ETT  Additional Equipment:   Intra-op Plan:   Post-operative Plan: Extubation in OR  Informed Consent: I have reviewed the patients History and Physical, chart, labs and discussed the procedure including the risks, benefits and alternatives for the proposed anesthesia with the patient or authorized representative who has indicated his/her understanding and acceptance.     Dental advisory given  Plan Discussed with: CRNA  Anesthesia Plan Comments: (PAT note written 01/18/2020 by Myra Gianotti, PA-C. )       Anesthesia Quick  Evaluation

## 2020-01-20 ENCOUNTER — Ambulatory Visit (HOSPITAL_COMMUNITY): Payer: PPO | Admitting: Vascular Surgery

## 2020-01-20 ENCOUNTER — Other Ambulatory Visit: Payer: Self-pay

## 2020-01-20 ENCOUNTER — Ambulatory Visit (HOSPITAL_COMMUNITY): Payer: PPO | Admitting: Anesthesiology

## 2020-01-20 ENCOUNTER — Encounter (HOSPITAL_COMMUNITY): Payer: Self-pay | Admitting: Otolaryngology

## 2020-01-20 ENCOUNTER — Ambulatory Visit (HOSPITAL_COMMUNITY)
Admission: RE | Admit: 2020-01-20 | Discharge: 2020-01-20 | Disposition: A | Discharge status: Home / Self Care | Payer: PPO | Attending: Otolaryngology | Admitting: Otolaryngology

## 2020-01-20 ENCOUNTER — Encounter (HOSPITAL_COMMUNITY)
Admission: RE | Disposition: A | Discharge status: Home / Self Care | Payer: Self-pay | Source: Home / Self Care | Attending: Otolaryngology

## 2020-01-20 DIAGNOSIS — E7800 Pure hypercholesterolemia, unspecified: Secondary | ICD-10-CM | POA: Diagnosis not present

## 2020-01-20 DIAGNOSIS — Z79899 Other long term (current) drug therapy: Secondary | ICD-10-CM | POA: Insufficient documentation

## 2020-01-20 DIAGNOSIS — Z6838 Body mass index (BMI) 38.0-38.9, adult: Secondary | ICD-10-CM | POA: Diagnosis not present

## 2020-01-20 DIAGNOSIS — R900 Intracranial space-occupying lesion found on diagnostic imaging of central nervous system: Secondary | ICD-10-CM | POA: Insufficient documentation

## 2020-01-20 DIAGNOSIS — H02401 Unspecified ptosis of right eyelid: Secondary | ICD-10-CM | POA: Insufficient documentation

## 2020-01-20 DIAGNOSIS — E785 Hyperlipidemia, unspecified: Secondary | ICD-10-CM | POA: Diagnosis not present

## 2020-01-20 DIAGNOSIS — D4989 Neoplasm of unspecified behavior of other specified sites: Secondary | ICD-10-CM

## 2020-01-20 DIAGNOSIS — Z882 Allergy status to sulfonamides status: Secondary | ICD-10-CM | POA: Insufficient documentation

## 2020-01-20 DIAGNOSIS — Z886 Allergy status to analgesic agent status: Secondary | ICD-10-CM | POA: Insufficient documentation

## 2020-01-20 DIAGNOSIS — R220 Localized swelling, mass and lump, head: Secondary | ICD-10-CM | POA: Diagnosis not present

## 2020-01-20 DIAGNOSIS — M1990 Unspecified osteoarthritis, unspecified site: Secondary | ICD-10-CM | POA: Insufficient documentation

## 2020-01-20 DIAGNOSIS — J330 Polyp of nasal cavity: Secondary | ICD-10-CM | POA: Diagnosis not present

## 2020-01-20 DIAGNOSIS — N179 Acute kidney failure, unspecified: Secondary | ICD-10-CM | POA: Diagnosis not present

## 2020-01-20 DIAGNOSIS — J3489 Other specified disorders of nose and nasal sinuses: Secondary | ICD-10-CM | POA: Diagnosis not present

## 2020-01-20 DIAGNOSIS — I10 Essential (primary) hypertension: Secondary | ICD-10-CM | POA: Diagnosis not present

## 2020-01-20 DIAGNOSIS — D491 Neoplasm of unspecified behavior of respiratory system: Secondary | ICD-10-CM | POA: Diagnosis not present

## 2020-01-20 DIAGNOSIS — J349 Unspecified disorder of nose and nasal sinuses: Secondary | ICD-10-CM | POA: Diagnosis not present

## 2020-01-20 HISTORY — PX: SINUS ENDO WITH FUSION: SHX5329

## 2020-01-20 HISTORY — PX: MAXILLARY ANTROSTOMY: SHX2003

## 2020-01-20 LAB — GLUCOSE, CAPILLARY: Glucose-Capillary: 71 mg/dL (ref 70–99)

## 2020-01-20 SURGERY — SURGERY, PARANASAL SINUS, ENDOSCOPIC, WITH NASAL SEPTOPLASTY, TURBINOPLASTY, AND MAXILLARY SINUSOTOMY
Anesthesia: General | Site: Nose | Laterality: Right

## 2020-01-20 MED ORDER — ONDANSETRON HCL 4 MG/2ML IJ SOLN
INTRAMUSCULAR | Status: DC | PRN
  Administered 2020-01-20: 4 mg via INTRAVENOUS

## 2020-01-20 MED ORDER — FENTANYL CITRATE (PF) 250 MCG/5ML IJ SOLN
INTRAMUSCULAR | Status: AC
  Filled 2020-01-20: qty 5

## 2020-01-20 MED ORDER — LIDOCAINE-EPINEPHRINE 1 %-1:100000 IJ SOLN
INTRAMUSCULAR | Status: DC | PRN
  Administered 2020-01-20: 8 mL

## 2020-01-20 MED ORDER — OXYCODONE HCL 5 MG PO TABS: 5.0000 mg | ORAL_TABLET | Freq: Four times a day (QID) | ORAL | 0 refills | Status: AC | PRN

## 2020-01-20 MED ORDER — PROPOFOL 10 MG/ML IV BOLUS
INTRAVENOUS | Status: AC
  Filled 2020-01-20: qty 20

## 2020-01-20 MED ORDER — PROPOFOL 10 MG/ML IV BOLUS
INTRAVENOUS | Status: DC | PRN
  Administered 2020-01-20: 80 mg via INTRAVENOUS

## 2020-01-20 MED ORDER — ROCURONIUM BROMIDE 50 MG/5ML IV SOSY
PREFILLED_SYRINGE | INTRAVENOUS | Status: DC | PRN
  Administered 2020-01-20: 10 mg via INTRAVENOUS
  Administered 2020-01-20: 40 mg via INTRAVENOUS

## 2020-01-20 MED ORDER — DEXAMETHASONE SODIUM PHOSPHATE 10 MG/ML IJ SOLN
INTRAMUSCULAR | Status: DC | PRN
  Administered 2020-01-20: 10 mg via INTRAVENOUS

## 2020-01-20 MED ORDER — ACETAMINOPHEN 10 MG/ML IV SOLN
INTRAVENOUS | Status: DC | PRN
  Administered 2020-01-20: 1000 mg via INTRAVENOUS

## 2020-01-20 MED ORDER — OXYMETAZOLINE HCL 0.05 % NA SOLN
NASAL | Status: AC
  Filled 2020-01-20: qty 30

## 2020-01-20 MED ORDER — FENTANYL CITRATE (PF) 100 MCG/2ML IJ SOLN
INTRAMUSCULAR | Status: DC | PRN
  Administered 2020-01-20 (×2): 50 ug via INTRAVENOUS

## 2020-01-20 MED ORDER — DEXAMETHASONE SODIUM PHOSPHATE 10 MG/ML IJ SOLN
INTRAMUSCULAR | Status: AC
  Filled 2020-01-20: qty 1

## 2020-01-20 MED ORDER — LIDOCAINE-EPINEPHRINE 1 %-1:100000 IJ SOLN
INTRAMUSCULAR | Status: AC
  Filled 2020-01-20: qty 1

## 2020-01-20 MED ORDER — HEMOSTATIC AGENTS (NO CHARGE) OPTIME
TOPICAL | Status: DC | PRN
  Administered 2020-01-20: 1 via TOPICAL

## 2020-01-20 MED ORDER — LACTATED RINGERS IV SOLN: INTRAVENOUS | Status: DC

## 2020-01-20 MED ORDER — LIDOCAINE 2% (20 MG/ML) 5 ML SYRINGE
INTRAMUSCULAR | Status: AC
  Filled 2020-01-20: qty 5

## 2020-01-20 MED ORDER — ARTIFICIAL TEARS OPHTHALMIC OINT
TOPICAL_OINTMENT | OPHTHALMIC | Status: DC | PRN
  Administered 2020-01-20: 1 via OPHTHALMIC

## 2020-01-20 MED ORDER — LIDOCAINE 2% (20 MG/ML) 5 ML SYRINGE
INTRAMUSCULAR | Status: DC | PRN
  Administered 2020-01-20: 60 mg via INTRAVENOUS

## 2020-01-20 MED ORDER — ROCURONIUM BROMIDE 10 MG/ML (PF) SYRINGE
PREFILLED_SYRINGE | INTRAVENOUS | Status: AC
  Filled 2020-01-20: qty 10

## 2020-01-20 MED ORDER — OXYMETAZOLINE HCL 0.05 % NA SOLN
NASAL | Status: DC | PRN
  Administered 2020-01-20: 1

## 2020-01-20 MED ORDER — SODIUM CHLORIDE 0.9 % IR SOLN
Status: DC | PRN
  Administered 2020-01-20: 1

## 2020-01-20 MED ORDER — HYDROMORPHONE HCL 1 MG/ML IJ SOLN: 0.2500 mg | INTRAMUSCULAR | Status: DC | PRN

## 2020-01-20 MED ORDER — CEFAZOLIN SODIUM-DEXTROSE 2-4 GM/100ML-% IV SOLN
2.0000 g | INTRAVENOUS | Status: AC
  Administered 2020-01-20: 2 g via INTRAVENOUS
  Filled 2020-01-20: qty 100

## 2020-01-20 MED ORDER — SUGAMMADEX SODIUM 200 MG/2ML IV SOLN
INTRAVENOUS | Status: DC | PRN
  Administered 2020-01-20: 200 mg via INTRAVENOUS

## 2020-01-20 MED ORDER — OXYCODONE HCL 5 MG PO TABS
5.0000 mg | ORAL_TABLET | Freq: Once | ORAL | Status: AC
  Administered 2020-01-20: 5 mg via ORAL

## 2020-01-20 MED ORDER — ONDANSETRON HCL 4 MG/2ML IJ SOLN
INTRAMUSCULAR | Status: AC
  Filled 2020-01-20: qty 2

## 2020-01-20 MED ORDER — OXYCODONE HCL 5 MG PO TABS
ORAL_TABLET | ORAL | Status: AC
  Filled 2020-01-20: qty 1

## 2020-01-20 MED ORDER — ACETAMINOPHEN 10 MG/ML IV SOLN
INTRAVENOUS | Status: AC
  Filled 2020-01-20: qty 100

## 2020-01-20 MED ORDER — ARTIFICIAL TEARS OPHTHALMIC OINT
TOPICAL_OINTMENT | OPHTHALMIC | Status: AC
  Filled 2020-01-20: qty 7

## 2020-01-20 SURGICAL SUPPLY — 56 items
ATTRACTOMAT 16X20 MAGNETIC DRP (DRAPES) IMPLANT
BLADE RAD40 ROTATE 4M 4 5PK (BLADE) IMPLANT
BLADE RAD60 ROTATE M4 4 5PK (BLADE) IMPLANT
BLADE ROTATE RAD 40 4 M4 (BLADE) IMPLANT
BLADE ROTATE TRICUT 4X13 M4 (BLADE) IMPLANT
BLADE SURG 15 STRL LF DISP TIS (BLADE) ×2 IMPLANT
BLADE SURG 15 STRL SS (BLADE) ×1
BLADE TRICUT ROTATE M4 4 5PK (BLADE) ×3 IMPLANT
CANISTER SUCT 3000ML PPV (MISCELLANEOUS) ×3 IMPLANT
COAGULATOR SUCT 8FR VV (MISCELLANEOUS) ×3 IMPLANT
COAGULATOR SUCT SWTCH 10FR 6 (ELECTROSURGICAL) IMPLANT
COVER WAND RF STERILE (DRAPES) IMPLANT
DRAPE HALF SHEET 40X57 (DRAPES) IMPLANT
DRESSING NASAL KENNEDY 3.5X.9 (MISCELLANEOUS) IMPLANT
DRSG NASAL KENNEDY 3.5X.9 (MISCELLANEOUS)
DRSG TELFA 3X8 NADH (GAUZE/BANDAGES/DRESSINGS) IMPLANT
ELECT COATED BLADE 2.86 ST (ELECTRODE) ×3 IMPLANT
ELECT REM PT RETURN 9FT ADLT (ELECTROSURGICAL) ×3
ELECTRODE REM PT RTRN 9FT ADLT (ELECTROSURGICAL) ×2 IMPLANT
FILTER ARTHROSCOPY CONVERTOR (FILTER) ×6 IMPLANT
GLOVE BIOGEL M 7.0 STRL (GLOVE) ×6 IMPLANT
GOWN STRL REUS W/ TWL LRG LVL3 (GOWN DISPOSABLE) ×4 IMPLANT
GOWN STRL REUS W/TWL LRG LVL3 (GOWN DISPOSABLE) ×2
HEMOSTAT SURGICEL .5X2 ABSORB (HEMOSTASIS) ×3 IMPLANT
IV NS 1000ML (IV SOLUTION) ×1
IV NS 1000ML BAXH (IV SOLUTION) ×2 IMPLANT
KIT BASIN OR (CUSTOM PROCEDURE TRAY) ×3 IMPLANT
KIT TURNOVER KIT B (KITS) ×3 IMPLANT
NEEDLE 18GX1X1/2 (RX/OR ONLY) (NEEDLE) IMPLANT
NEEDLE HYPO 25GX1X1/2 BEV (NEEDLE) ×3 IMPLANT
NS IRRIG 1000ML POUR BTL (IV SOLUTION) ×3 IMPLANT
PAD ARMBOARD 7.5X6 YLW CONV (MISCELLANEOUS) ×3 IMPLANT
PATTIES SURGICAL .5 X3 (DISPOSABLE) ×3 IMPLANT
PENCIL BUTTON HOLSTER BLD 10FT (ELECTRODE) IMPLANT
SET WALTER ACTIVATION W/DRAPE (SET/KITS/TRAYS/PACK) ×3 IMPLANT
SPECIMEN JAR SMALL (MISCELLANEOUS) ×6 IMPLANT
SPLINT NASAL DOYLE BI-VL (GAUZE/BANDAGES/DRESSINGS) IMPLANT
SPONGE NEURO XRAY DETECT 1X3 (DISPOSABLE) ×3 IMPLANT
SUT CHROMIC 3 0 SH 27 (SUTURE) ×3 IMPLANT
SUT ETHILON 3 0 FSL (SUTURE) IMPLANT
SUT ETHILON 3 0 PS 1 (SUTURE) IMPLANT
SUT PLAIN 4 0 ~~LOC~~ 1 (SUTURE) IMPLANT
SWAB COLLECTION DEVICE MRSA (MISCELLANEOUS) IMPLANT
SWAB CULTURE ESWAB REG 1ML (MISCELLANEOUS) IMPLANT
SYR 50ML SLIP (SYRINGE) IMPLANT
SYR CONTROL 10ML LL (SYRINGE) ×3 IMPLANT
TOWEL GREEN STERILE FF (TOWEL DISPOSABLE) ×3 IMPLANT
TRACKER ENT INSTRUMENT (MISCELLANEOUS) ×3 IMPLANT
TRACKER ENT PATIENT (MISCELLANEOUS) ×3 IMPLANT
TRAP SPECIMEN MUCOUS 40CC (MISCELLANEOUS) IMPLANT
TRAY ENT MC OR (CUSTOM PROCEDURE TRAY) ×3 IMPLANT
TUBE CONNECTING 12X1/4 (SUCTIONS) ×3 IMPLANT
TUBING EXTENTION W/L.L. (IV SETS) ×3 IMPLANT
TUBING STRAIGHTSHOT EPS 5PK (TUBING) ×3 IMPLANT
WATER STERILE IRR 1000ML POUR (IV SOLUTION) ×3 IMPLANT
WIPE INSTRUMENT VISIWIPE 73X73 (MISCELLANEOUS) ×3 IMPLANT

## 2020-01-20 NOTE — Anesthesia Postprocedure Evaluation (Signed)
Anesthesia Post Note  Patient: Lauren Santana [facility]  Procedure(s) Performed: SINUS ENDO WITH FUSION WITH BIOSPY (Right Nose) MAXILLARY ANTROSTOMY  WITH BIOPSY CALDWELL APPROACH (Right Mouth)     Patient location during evaluation: PACU Anesthesia Type: General Level of consciousness: awake and alert Pain management: pain level controlled Vital Signs Assessment: post-procedure vital signs reviewed and stable Respiratory status: spontaneous breathing, nonlabored ventilation and respiratory function stable Cardiovascular status: blood pressure returned to baseline and stable Postop Assessment: no apparent nausea or vomiting Anesthetic complications: no    Last Vitals:  Vitals:   01/20/20 1155 01/20/20 1210  BP: (!) 140/53 (!) 142/55  Pulse: 73 74  Resp: 17 19  Temp: 36.8 C 36.7 C  SpO2: 100% 98%    Last Pain:  Vitals:   01/20/20 1221  TempSrc:   PainSc: 6                  Lauren Santana,W. EDMOND

## 2020-01-20 NOTE — Anesthesia Procedure Notes (Signed)
Procedure Name: Intubation Date/Time: 01/20/2020 10:13 AM Performed by: Scheryl Darter, CRNA Pre-anesthesia Checklist: Patient identified, Emergency Drugs available, Suction available and Patient being monitored Patient Re-evaluated:Patient Re-evaluated prior to induction Oxygen Delivery Method: Circle System Utilized Preoxygenation: Pre-oxygenation with 100% oxygen Induction Type: IV induction Ventilation: Mask ventilation without difficulty Laryngoscope Size: Mac and 3 Grade View: Grade I Tube type: Oral Number of attempts: 1 Airway Equipment and Method: Stylet and Oral airway Placement Confirmation: ETT inserted through vocal cords under direct vision,  positive ETCO2 and breath sounds checked- equal and bilateral Secured at: 21 cm Tube secured with: Tape Dental Injury: Teeth and Oropharynx as per pre-operative assessment

## 2020-01-20 NOTE — H&P (Signed)
Lauren Santana is an 69 y.o. female.   Chief Complaint: Right eyelid droop HPI: Patient evaluated for right eyelid droop and double vision.  Work-up including MRI scan showed enhancing lesion involving the right skull base, trigeminal nerve system and right orbit.  Patient evaluated by neurosurgery, ENT consult obtained for possible biopsy via transmaxillary sinus approach.  Past Medical History:  Diagnosis Date  . Arthritis   . Edema    B/LLE  . High cholesterol   . Hypertension   . Numbness    B/LLE  . Pre-diabetes   . Wears glasses     Past Surgical History:  Procedure Laterality Date  . ABDOMINAL HYSTERECTOMY     and BSO  . CATARACT EXTRACTION Left 10/2018  . [place]  . COLONOSCOPY    . EYE SURGERY Bilateral    CATARACT SX  . HERNIA REPAIR  2001   incisional  . JOINT REPLACEMENT Left    Knee  . MULTIPLE TOOTH EXTRACTIONS  2011  . neck mass removal     "fatty tissue, not cancerous" per pt's son  . TOTAL KNEE ARTHROPLASTY Left 07/17/2014   Procedure: TOTAL KNEE ARTHROPLASTY;  Surgeon: Kerin Salen, MD;  Location: [facility];  Service: Orthopedics;  Laterality: Left;    Family History  Problem Relation Age of Onset  . Colon cancer Sister   . Cancer Sister        unsure of type of cancer   Social History:  reports that she has never smoked. She has never used smokeless tobacco. She reports that she does not drink alcohol or use drugs.  Allergies:  Allergies  Allergen Reactions  . Tylenol [Acetaminophen] Other (See Comments)    States "seems like eats my stomach"  . Sulfa Antibiotics Other (See Comments)    Unknown allergic reaction  . Sulfasalazine Other (See Comments)    Unknown allergic reaction Unknown allergic reaction    Medications Prior to Admission  Medication Sig Dispense Refill  . atorvastatin (LIPITOR) 10 MG tablet TAKE ONE TABLET BY MOUTH  ONCE DAILY IN THE EVENING (Patient taking differently: Take 10 mg by mouth every  evening. ) 30 tablet 0  . furosemide (LASIX) 20 MG tablet Take 1 tablet (20 mg total) by mouth daily. 30 tablet 0  . gabapentin (NEURONTIN) 300 MG capsule Take 300 mg by mouth 3 (three) times daily.    Marland Kitchen labetalol (NORMODYNE) 100 MG tablet Take 100 mg by mouth 2 (two) times daily.    . medroxyPROGESTERone (PROVERA) 10 MG tablet Take 10 mg by mouth daily.    . potassium chloride 20 MEQ/15ML (10%) SOLN Take 13.3333 mEq by mouth daily. 10 ml    . sucralfate (CARAFATE) 1 GM/10ML suspension Take 1 g by mouth 3 (three) times daily before meals.    . traMADol (ULTRAM) 50 MG tablet Take 50 mg by mouth 2 (two) times daily.  0  . diphenhydrAMINE (BENADRYL) 25 MG tablet Take 25 mg by mouth every 6 (six) hours as needed for itching or allergies.      Results for orders placed or performed during the hospital encounter of 01/20/20 (from the past 48 hour(s))  Glucose, capillary     Status: None   Collection Time: 01/20/20  7:39 AM  Result Value Ref Range   Glucose-Capillary 71 70 - 99 mg/dL    Comment: Glucose reference range applies only to samples taken after fasting for at least 8 hours.   No results  found.  Review of Systems  Constitutional: Negative.   HENT: Negative.   Eyes: Positive for visual disturbance.    Blood pressure (!) 160/64, pulse 86, temperature 98.8 F (37.1 C), temperature source Oral, resp. rate 18, height 5\' 1"  (1.549 m), weight 91.6 kg, SpO2 100 %. Physical Exam  Constitutional: She appears well-developed and well-nourished.  HENT:  Head: Normocephalic.  Eyes:  Right eyelid droop and limitation of gaze with diplopia  Cardiovascular: Normal rate.  Respiratory: Effort normal.  Musculoskeletal:     Cervical back: Normal range of motion.     Assessment/Plan Patient admitted for outpatient right transmaxillary endoscopic biopsy of tumor.  Jerrell Belfast, MD 01/20/2020, 9:37 AM

## 2020-01-20 NOTE — Op Note (Signed)
Operative Note:  ENDOSCOPIC NASAL/NASOPHARYNGEAL BIOPSY      Patient: Lauren Santana [facility]  Medical record number: [id number]  Date:01/20/2020  Pre-operative Indications: 1.  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  Endoscopic biopsy of the right inferior turbinate    2.  Transmaxillary endoscopic biopsy of the right maxillary sinus via limited Caldwell-Luc approach with intraoperative computer-assisted navigation (Fusion)      Anesthesia: GET  Surgeon: Delsa Bern, M.D.  Complications: None  EBL: 50 cc  Findings: Normal-appearing right inferior turbinate, biopsies taken from the mid and posterior aspect of the turbinate including the deep submucosal tissue.  Abnormal bony anatomy of the maxillary sinus with polypoid hypertrophied mucosa within the sinus.   Brief History: The patient is a 69 y.o. female with a history of right upper lid ptosis, right diplopia and right facial numbness.  Patient evaluated initially by her ophthalmology specialist and concerns raised on these findings regarding possible neurogenic source for her symptoms.  MRI scan obtained which showed an enhancing lesion extending from the skull base anteriorly to the orbit and into the maxillary sinus.  Neurosurgical service consulted for possible biopsy patient referred to [facility] for evaluation for biopsy and tissue diagnosis.  CT scan of the sinuses obtained at [facility] for intraoperative computer-assisted navigation.  Given the patient's history and findings, the above surgical procedures were recommended, risks and benefits were discussed in detail with the patient and family, they understand and agree with our plan for surgery which is scheduled at [place] under general anesthesia.  Surgical Procedure: The patient is brought to the operating room on  01/20/2020 and placed in supine position on the operating table. General anesthesia was established without difficulty. When the patient was adequately anesthetized, surgical timeout was performed with correct identification of the patient and the surgical procedure. The patient's nose was then packed with Afrin-soaked cottonoid pledgets were left in place for approximately 10 minutes lateral vasoconstriction and hemostasis.    With the patient prepped draped and prepared for surgery, nasal endoscopy was performed bilaterally.  Findings include no apparent mucosal abnormality within the right nasal passageway, middle meatus or posterior nasoethmoid recess despite abnormal MRI findings.  Endoscopic biopsy was undertaken with combination of through-cutting and grasping forceps of the mid and posterior aspect of the right inferior turbinate.  Soft tissue specimen was sent to pathology for gross and microscopic evaluation.  The patient's nasal cavity was reexamined and bleeding was controlled with monopolar suction cautery.  There was no further bleeding.  A small amount of Surgicel was placed over the biopsy and cautery site.  The patient's nose was treated with topical antibiotic cream.  Endoscopic transmaxillary biopsy via limited Caldwell-Luc approach using 0 degree endoscopy and navigation then undertaken.  A 3 cm horizontally oriented mucosal incision was created in the right superior gingivobuccal sulcus.  This carried through the mucosa underlying submucosa and a mucoperiosteal flap was elevated superiorly lifting the soft tissue of the cheek allowing direct access to the anterior aspect of the right maxilla.  The patient had a bony dehiscence and friable appearing bone which was biopsied and sent as a separate specimen.  Within the sinus itself the mucosa with polypoid and swollen.  Multiple biopsies were then taken within the anterior and posterior aspects of the maxillary sinus mucosa.  Posterior wall of  the  maxillary sinuses and identified using navigation and multiple biopsies were taken of this area as well which was the most likely source of infiltrating tumor.  No significant bleeding, no evidence of infection or other finding.  The endoscope and navigation was removed the patient's oral gingivobuccal incision was then closed with multiple interrupted 3-0 chromic sutures.  Surgical sponge count was correct. An oral gastric tube was passed and the stomach contents were aspirated. Patient was awakened from anesthetic and transferred from the operating room to the recovery room in stable condition. There were no complications and blood loss was 50 cc.   Delsa Bern, M.D. [facility] 01/20/2020

## 2020-01-20 NOTE — Transfer of Care (Signed)
Immediate Anesthesia Transfer of Care Note  Patient: Lauren Santana [facility]  Procedure(s) Performed: SINUS ENDO WITH FUSION WITH BIOSPY (Right Nose) MAXILLARY ANTROSTOMY  WITH BIOPSY CALDWELL APPROACH (Right Mouth)  Patient Location: PACU  Anesthesia Type:General  Level of Consciousness: awake, alert , oriented and sedated  Airway & Oxygen Therapy: Patient Spontanous Breathing, Patient connected to nasal cannula oxygen and Patient connected to face mask oxygen  Post-op Assessment: Report given to RN, Post -op Vital signs reviewed and stable, Post -op Vital signs reviewed and unstable, Anesthesiologist notified and Patient moving all extremities  Post vital signs: Reviewed and stable  Last Vitals:  Vitals Value Taken Time  BP 127/56 01/20/20 1139  Temp    Pulse 77 01/20/20 1142  Resp 17 01/20/20 1142  SpO2 100 % 01/20/20 1142  Vitals shown include unvalidated device data.  Last Pain:  Vitals:   01/20/20 0749  TempSrc:   PainSc: 0-No pain      Patients Stated Pain Goal: 4 ([id number])  Complications: No apparent anesthesia complications

## 2020-01-22 DIAGNOSIS — I10 Essential (primary) hypertension: Secondary | ICD-10-CM | POA: Diagnosis not present

## 2020-01-22 DIAGNOSIS — M159 Polyosteoarthritis, unspecified: Secondary | ICD-10-CM | POA: Diagnosis not present

## 2020-01-22 DIAGNOSIS — E785 Hyperlipidemia, unspecified: Secondary | ICD-10-CM | POA: Diagnosis not present

## 2020-01-22 DIAGNOSIS — E1169 Type 2 diabetes mellitus with other specified complication: Secondary | ICD-10-CM | POA: Diagnosis not present

## 2020-01-23 DIAGNOSIS — M25562 Pain in left knee: Secondary | ICD-10-CM | POA: Diagnosis not present

## 2020-01-23 DIAGNOSIS — M25561 Pain in right knee: Secondary | ICD-10-CM | POA: Diagnosis not present

## 2020-01-23 DIAGNOSIS — G894 Chronic pain syndrome: Secondary | ICD-10-CM | POA: Diagnosis not present

## 2020-01-23 DIAGNOSIS — R1031 Right lower quadrant pain: Secondary | ICD-10-CM | POA: Diagnosis not present

## 2020-01-23 DIAGNOSIS — Z79891 Long term (current) use of opiate analgesic: Secondary | ICD-10-CM | POA: Diagnosis not present

## 2020-01-26 LAB — SURGICAL PATHOLOGY

## 2020-02-10 ENCOUNTER — Other Ambulatory Visit: Payer: Self-pay | Admitting: Otolaryngology

## 2020-02-14 NOTE — Pre-Procedure Instructions (Signed)
   Lauren Santana [facility]  02/14/2020     [id number] - [place], [place] [id number] [place] [id number] Phone: [phone] Fax: [phone]   Your procedure is scheduled on February 17, 2020  Report to [name] Admitting at 8:45 A.M.  Call this number if you have problems the morning of surgery:  [phone]   Remember: Brush your teeth the morning of surgery with your regular toothpaste.   Do not eat or drink after midnight the night before surgery.    Take these medicines the morning of surgery with A SIP OF WATER   gabapentin (NEURONTIN)    labetalol (NORMODYNE)  medroxyPROGESTERone (PROVERA)  traMADol (ULTRAM)  If needed: diphenhydrAMINE (BENADRYL) for itching or allergies  Stop taking Aspirin (unless otherwise advised by surgeon), vitamins, fish oil and herbal medications. Do not take any NSAIDs ie: Ibuprofen, Advil, Naproxen (Aleve), Motrin, BC and Goody Powder; stop now.  Do not wear jewelry, make-up or nail polish.  Do not wear lotions, powders, or perfumes, or deodorant.  Do not shave 48 hours prior to surgery.    Do not bring valuables to the hospital.  [name] is not responsible for any belongings or valuables.  Contacts, dentures or bridgework may not be worn into surgery.  Leave your suitcase in the car.  After surgery it may be brought to your room. For patients admitted to the hospital, discharge time will be determined by your treatment team.  Patients discharged the day of surgery will not be allowed to drive home.   Special instructions: See  " [name] " sheet.  Please read over the following fact sheets that you were given. Pain Booklet, Coughing and Deep Breathing and Surgical Site Infection Prevention

## 2020-02-15 ENCOUNTER — Encounter (HOSPITAL_COMMUNITY): Payer: Self-pay

## 2020-02-15 ENCOUNTER — Other Ambulatory Visit: Payer: Self-pay

## 2020-02-15 ENCOUNTER — Other Ambulatory Visit (HOSPITAL_COMMUNITY)
Admission: RE | Admit: 2020-02-15 | Discharge: 2020-02-15 | Disposition: A | Discharge status: Home / Self Care | Payer: PPO | Source: Ambulatory Visit | Attending: Otolaryngology | Admitting: Otolaryngology

## 2020-02-15 ENCOUNTER — Encounter (HOSPITAL_COMMUNITY)
Admission: RE | Admit: 2020-02-15 | Discharge: 2020-02-15 | Disposition: A | Discharge status: Home / Self Care | Payer: PPO | Source: Ambulatory Visit | Attending: Otolaryngology | Admitting: Otolaryngology

## 2020-02-15 DIAGNOSIS — Z20822 Contact with and (suspected) exposure to covid-19: Secondary | ICD-10-CM | POA: Insufficient documentation

## 2020-02-15 DIAGNOSIS — Z01812 Encounter for preprocedural laboratory examination: Secondary | ICD-10-CM | POA: Diagnosis not present

## 2020-02-15 HISTORY — DX: Dependence on other enabling machines and devices: Z99.89

## 2020-02-15 HISTORY — DX: Endometriosis, unspecified: N80.9

## 2020-02-15 HISTORY — DX: Presence of dental prosthetic device (complete) (partial): Z97.2

## 2020-02-15 LAB — BASIC METABOLIC PANEL
Anion gap: 9 (ref 5–15)
BUN: 5 mg/dL — ABNORMAL LOW (ref 8–23)
CO2: 26 mmol/L (ref 22–32)
Calcium: 8.9 mg/dL (ref 8.9–10.3)
Chloride: 107 mmol/L (ref 98–111)
Creatinine, Ser: 0.74 mg/dL (ref 0.44–1.00)
GFR calc Af Amer: >60 mL/min (ref >60)
GFR calc non Af Amer: >60 mL/min (ref >60)
Glucose, Bld: 85 mg/dL (ref 70–99)
Potassium: 3.2 mmol/L — ABNORMAL LOW (ref 3.5–5.1)
Sodium: 142 mmol/L (ref 135–145)

## 2020-02-15 LAB — CBC
HCT: 37.1 % (ref 36.0–46.0)
Hemoglobin: 11.5 g/dL — ABNORMAL LOW (ref 12.0–15.0)
MCH: 30.7 pg (ref 26.0–34.0)
MCHC: 31 g/dL (ref 30.0–36.0)
MCV: 99.2 fL (ref 80.0–100.0)
Platelets: 132 10*3/uL — ABNORMAL LOW (ref 150–400)
RBC: 3.74 MIL/uL — ABNORMAL LOW (ref 3.87–5.11)
RDW: 14.1 % (ref 11.5–15.5)
WBC: 5.4 10*3/uL (ref 4.0–10.5)
nRBC: 0 % (ref 0.0–0.2)

## 2020-02-15 LAB — SARS CORONAVIRUS 2 (TAT 6-24 HRS): SARS Coronavirus 2: NEGATIVE

## 2020-02-15 LAB — GLUCOSE, CAPILLARY: Glucose-Capillary: 73 mg/dL (ref 70–99)

## 2020-02-15 NOTE — Progress Notes (Signed)
Patient denies shortness of breath, fever, cough and chest pain.  PCP - Dr Lucianne Lei Cardiologist - denies  Chest x-ray - denies EKG - 01/17/20 Stress Test - denies ECHO - 02/15/16 Cardiac Cath - denies  Anesthesia review: No.  No change from last surgery on 01/20/20.  See note from [place], [place] 01/17/20.  Coronavirus Screening Covid test scheduled 02/15/20 at 1 pm - after PAT appt. Have you experienced the following symptoms:  Cough yes/no: No Fever (>100.55F)  yes/no: No Runny nose yes/no: No Sore throat yes/no: No Difficulty breathing/shortness of breath  yes/no: No  Have you traveled in the last 14 days and where? yes/no: No  Patient verbalized understanding of instructions that were given to them at the PAT appointment.

## 2020-02-17 ENCOUNTER — Ambulatory Visit (HOSPITAL_COMMUNITY)
Admission: RE | Admit: 2020-02-17 | Discharge: 2020-02-17 | Disposition: A | Discharge status: Home / Self Care | Payer: PPO | Attending: Otolaryngology | Admitting: Otolaryngology

## 2020-02-17 ENCOUNTER — Other Ambulatory Visit: Payer: Self-pay

## 2020-02-17 ENCOUNTER — Ambulatory Visit (HOSPITAL_COMMUNITY): Payer: PPO | Admitting: Anesthesiology

## 2020-02-17 ENCOUNTER — Encounter (HOSPITAL_COMMUNITY): Payer: Self-pay | Admitting: *Deleted

## 2020-02-17 ENCOUNTER — Encounter (HOSPITAL_COMMUNITY)
Admission: RE | Disposition: A | Discharge status: Home / Self Care | Payer: Self-pay | Source: Home / Self Care | Attending: Otolaryngology

## 2020-02-17 ENCOUNTER — Encounter (HOSPITAL_COMMUNITY): Payer: Self-pay | Admitting: Otolaryngology

## 2020-02-17 DIAGNOSIS — Z79899 Other long term (current) drug therapy: Secondary | ICD-10-CM | POA: Insufficient documentation

## 2020-02-17 DIAGNOSIS — H532 Diplopia: Secondary | ICD-10-CM | POA: Insufficient documentation

## 2020-02-17 DIAGNOSIS — I10 Essential (primary) hypertension: Secondary | ICD-10-CM | POA: Diagnosis not present

## 2020-02-17 DIAGNOSIS — D140 Benign neoplasm of middle ear, nasal cavity and accessory sinuses: Secondary | ICD-10-CM | POA: Diagnosis not present

## 2020-02-17 DIAGNOSIS — J3489 Other specified disorders of nose and nasal sinuses: Secondary | ICD-10-CM | POA: Diagnosis not present

## 2020-02-17 DIAGNOSIS — D491 Neoplasm of unspecified behavior of respiratory system: Secondary | ICD-10-CM | POA: Diagnosis not present

## 2020-02-17 DIAGNOSIS — H02401 Unspecified ptosis of right eyelid: Secondary | ICD-10-CM | POA: Diagnosis not present

## 2020-02-17 DIAGNOSIS — M1990 Unspecified osteoarthritis, unspecified site: Secondary | ICD-10-CM | POA: Diagnosis not present

## 2020-02-17 DIAGNOSIS — E7800 Pure hypercholesterolemia, unspecified: Secondary | ICD-10-CM | POA: Diagnosis not present

## 2020-02-17 DIAGNOSIS — Z793 Long term (current) use of hormonal contraceptives: Secondary | ICD-10-CM | POA: Insufficient documentation

## 2020-02-17 DIAGNOSIS — Z20822 Contact with and (suspected) exposure to covid-19: Secondary | ICD-10-CM | POA: Insufficient documentation

## 2020-02-17 DIAGNOSIS — J330 Polyp of nasal cavity: Secondary | ICD-10-CM | POA: Diagnosis not present

## 2020-02-17 DIAGNOSIS — C310 Malignant neoplasm of maxillary sinus: Secondary | ICD-10-CM | POA: Diagnosis not present

## 2020-02-17 DIAGNOSIS — Z6838 Body mass index (BMI) 38.0-38.9, adult: Secondary | ICD-10-CM | POA: Diagnosis not present

## 2020-02-17 DIAGNOSIS — I2609 Other pulmonary embolism with acute cor pulmonale: Secondary | ICD-10-CM | POA: Diagnosis not present

## 2020-02-17 DIAGNOSIS — N179 Acute kidney failure, unspecified: Secondary | ICD-10-CM | POA: Diagnosis not present

## 2020-02-17 HISTORY — PX: NASAL ENDOSCOPY: SHX6577

## 2020-02-17 SURGERY — ENDOSCOPY, NOSE
Anesthesia: General | Site: Nose | Laterality: Right

## 2020-02-17 MED ORDER — ROCURONIUM BROMIDE 10 MG/ML (PF) SYRINGE
PREFILLED_SYRINGE | INTRAVENOUS | Status: AC
  Filled 2020-02-17: qty 10

## 2020-02-17 MED ORDER — OXYCODONE HCL 5 MG PO TABS
ORAL_TABLET | ORAL | Status: AC
  Administered 2020-02-17: 5 mg via ORAL
  Filled 2020-02-17: qty 1

## 2020-02-17 MED ORDER — LIDOCAINE 2% (20 MG/ML) 5 ML SYRINGE
INTRAMUSCULAR | Status: DC | PRN
  Administered 2020-02-17: 100 mg via INTRAVENOUS

## 2020-02-17 MED ORDER — MIDAZOLAM HCL 2 MG/2ML IJ SOLN
INTRAMUSCULAR | Status: AC
  Filled 2020-02-17: qty 2

## 2020-02-17 MED ORDER — ACETAMINOPHEN 160 MG/5ML PO SOLN: 325.0000 mg | ORAL | Status: DC | PRN

## 2020-02-17 MED ORDER — OXYMETAZOLINE HCL 0.05 % NA SOLN
NASAL | Status: AC
  Filled 2020-02-17: qty 30

## 2020-02-17 MED ORDER — FENTANYL CITRATE (PF) 250 MCG/5ML IJ SOLN
INTRAMUSCULAR | Status: AC
  Filled 2020-02-17: qty 5

## 2020-02-17 MED ORDER — PROPOFOL 10 MG/ML IV BOLUS
INTRAVENOUS | Status: AC
  Filled 2020-02-17: qty 20

## 2020-02-17 MED ORDER — GELATIN ABSORBABLE 12-7 MM EX MISC
CUTANEOUS | Status: AC
  Filled 2020-02-17: qty 1

## 2020-02-17 MED ORDER — MIDAZOLAM HCL 5 MG/5ML IJ SOLN
INTRAMUSCULAR | Status: DC | PRN
  Administered 2020-02-17: 1 mg via INTRAVENOUS

## 2020-02-17 MED ORDER — ONDANSETRON HCL 4 MG/2ML IJ SOLN
INTRAMUSCULAR | Status: AC
  Filled 2020-02-17: qty 2

## 2020-02-17 MED ORDER — MUPIROCIN CALCIUM 2 % EX CREA
TOPICAL_CREAM | CUTANEOUS | Status: AC
  Filled 2020-02-17: qty 15

## 2020-02-17 MED ORDER — FENTANYL CITRATE (PF) 100 MCG/2ML IJ SOLN: 25.0000 ug | INTRAMUSCULAR | Status: DC | PRN

## 2020-02-17 MED ORDER — MUPIROCIN CALCIUM 2 % EX CREA
TOPICAL_CREAM | CUTANEOUS | Status: DC | PRN
  Administered 2020-02-17: 1 via TOPICAL

## 2020-02-17 MED ORDER — ACETAMINOPHEN 325 MG PO TABS: 325.0000 mg | ORAL_TABLET | ORAL | Status: DC | PRN

## 2020-02-17 MED ORDER — PROPOFOL 10 MG/ML IV BOLUS
INTRAVENOUS | Status: DC | PRN
  Administered 2020-02-17: 100 mg via INTRAVENOUS

## 2020-02-17 MED ORDER — FENTANYL CITRATE (PF) 100 MCG/2ML IJ SOLN
INTRAMUSCULAR | Status: DC | PRN
  Administered 2020-02-17: 50 ug via INTRAVENOUS
  Administered 2020-02-17: 150 ug via INTRAVENOUS

## 2020-02-17 MED ORDER — ONDANSETRON HCL 4 MG/2ML IJ SOLN: 4.0000 mg | Freq: Once | INTRAMUSCULAR | Status: DC | PRN

## 2020-02-17 MED ORDER — DEXAMETHASONE SODIUM PHOSPHATE 10 MG/ML IJ SOLN
INTRAMUSCULAR | Status: AC
  Filled 2020-02-17: qty 1

## 2020-02-17 MED ORDER — OXYCODONE HCL 5 MG PO TABS: 5.0000 mg | ORAL_TABLET | Freq: Once | ORAL | Status: AC | PRN

## 2020-02-17 MED ORDER — SODIUM CHLORIDE 0.9 % IR SOLN
Status: DC | PRN
  Administered 2020-02-17: 1000 mL

## 2020-02-17 MED ORDER — DEXAMETHASONE SODIUM PHOSPHATE 10 MG/ML IJ SOLN
INTRAMUSCULAR | Status: DC | PRN
  Administered 2020-02-17: 10 mg via INTRAVENOUS

## 2020-02-17 MED ORDER — CEFAZOLIN SODIUM-DEXTROSE 2-4 GM/100ML-% IV SOLN
2.0000 g | INTRAVENOUS | Status: AC
  Administered 2020-02-17: 2 g via INTRAVENOUS

## 2020-02-17 MED ORDER — HEMOSTATIC AGENTS (NO CHARGE) OPTIME
TOPICAL | Status: DC | PRN
  Administered 2020-02-17: 1 via TOPICAL

## 2020-02-17 MED ORDER — LIDOCAINE-EPINEPHRINE 1 %-1:100000 IJ SOLN
INTRAMUSCULAR | Status: AC
  Filled 2020-02-17: qty 1

## 2020-02-17 MED ORDER — TRIAMCINOLONE ACETONIDE 40 MG/ML IJ SUSP
INTRAMUSCULAR | Status: AC
  Filled 2020-02-17: qty 5

## 2020-02-17 MED ORDER — ONDANSETRON HCL 4 MG/2ML IJ SOLN
INTRAMUSCULAR | Status: DC | PRN
  Administered 2020-02-17: 4 mg via INTRAVENOUS

## 2020-02-17 MED ORDER — MEPERIDINE HCL 25 MG/ML IJ SOLN: 6.2500 mg | INTRAMUSCULAR | Status: DC | PRN

## 2020-02-17 MED ORDER — OXYMETAZOLINE HCL 0.05 % NA SOLN
NASAL | Status: DC | PRN
  Administered 2020-02-17: 1

## 2020-02-17 MED ORDER — LIDOCAINE-EPINEPHRINE 1 %-1:100000 IJ SOLN
INTRAMUSCULAR | Status: DC | PRN
  Administered 2020-02-17: 3 mL

## 2020-02-17 MED ORDER — CEFAZOLIN SODIUM-DEXTROSE 2-4 GM/100ML-% IV SOLN
INTRAVENOUS | Status: AC
  Filled 2020-02-17: qty 100

## 2020-02-17 MED ORDER — ROCURONIUM BROMIDE 50 MG/5ML IV SOSY
PREFILLED_SYRINGE | INTRAVENOUS | Status: DC | PRN
  Administered 2020-02-17: 50 mg via INTRAVENOUS

## 2020-02-17 MED ORDER — SUGAMMADEX SODIUM 200 MG/2ML IV SOLN
INTRAVENOUS | Status: DC | PRN
  Administered 2020-02-17: 400 mg via INTRAVENOUS

## 2020-02-17 MED ORDER — LACTATED RINGERS IV SOLN: INTRAVENOUS | Status: DC

## 2020-02-17 MED ORDER — LIDOCAINE 2% (20 MG/ML) 5 ML SYRINGE
INTRAMUSCULAR | Status: AC
  Filled 2020-02-17: qty 5

## 2020-02-17 MED ORDER — OXYCODONE HCL 5 MG/5ML PO SOLN: 5.0000 mg | Freq: Once | ORAL | Status: AC | PRN

## 2020-02-17 SURGICAL SUPPLY — 26 items
CANISTER SUCT 3000ML PPV (MISCELLANEOUS) ×4 IMPLANT
COAGULATOR SUCT 8FR VV (MISCELLANEOUS) ×2 IMPLANT
COVER WAND RF STERILE (DRAPES) ×2 IMPLANT
DECANTER SPIKE VIAL GLASS SM (MISCELLANEOUS) ×2 IMPLANT
DRAPE HALF SHEET 40X57 (DRAPES) ×2 IMPLANT
ELECT REM PT RETURN 9FT ADLT (ELECTROSURGICAL) ×2
ELECTRODE REM PT RTRN 9FT ADLT (ELECTROSURGICAL) ×1 IMPLANT
FILTER ARTHROSCOPY CONVERTOR (FILTER) ×2 IMPLANT
GLOVE BIOGEL M 7.0 STRL (GLOVE) ×4 IMPLANT
GOWN STRL REUS W/ TWL LRG LVL3 (GOWN DISPOSABLE) ×2 IMPLANT
GOWN STRL REUS W/TWL LRG LVL3 (GOWN DISPOSABLE) ×4
HEMOSTAT SURGICEL 2X14 (HEMOSTASIS) ×2 IMPLANT
KIT BASIN OR (CUSTOM PROCEDURE TRAY) ×2 IMPLANT
KIT TURNOVER KIT B (KITS) ×2 IMPLANT
NEEDLE HYPO 25GX1X1/2 BEV (NEEDLE) ×2 IMPLANT
NEEDLE SPNL 25GX3.5 QUINCKE BL (NEEDLE) ×2 IMPLANT
NS IRRIG 1000ML POUR BTL (IV SOLUTION) ×2 IMPLANT
PAD ARMBOARD 7.5X6 YLW CONV (MISCELLANEOUS) ×4 IMPLANT
PENCIL BUTTON HOLSTER BLD 10FT (ELECTRODE) ×2 IMPLANT
SPONGE NEURO XRAY DETECT 1X3 (DISPOSABLE) ×2 IMPLANT
SYR CONTROL 10ML LL (SYRINGE) ×2 IMPLANT
TOWEL GREEN STERILE FF (TOWEL DISPOSABLE) ×2 IMPLANT
TRAY ENT MC OR (CUSTOM PROCEDURE TRAY) ×2 IMPLANT
TUBE CONNECTING 12X1/4 (SUCTIONS) ×2 IMPLANT
TUBING EXTENTION W/L.L. (IV SETS) ×2 IMPLANT
WATER STERILE IRR 1000ML POUR (IV SOLUTION) ×2 IMPLANT

## 2020-02-17 NOTE — Transfer of Care (Signed)
Immediate Anesthesia Transfer of Care Note  Patient: Lauren Santana [facility]  Procedure(s) Performed: NASAL ENDOSCOPY WITH RIGHT INFERIOR TURNBINATE REDUCTION (Right Nose)  Patient Location: PACU  Anesthesia Type:General  Level of Consciousness: drowsy and patient cooperative  Airway & Oxygen Therapy: Patient Spontanous Breathing  Post-op Assessment: Report given to RN and Post -op Vital signs reviewed and stable  Post vital signs: Reviewed and stable  Last Vitals:  Vitals Value Taken Time  BP 135/63 02/17/20 1205  Temp    Pulse 85 02/17/20 1206  Resp 20 02/17/20 1206  SpO2 99 % 02/17/20 1206  Vitals shown include unvalidated device data.  Last Pain:  Vitals:   02/17/20 0810  TempSrc: Oral         Complications: No apparent anesthesia complications

## 2020-02-17 NOTE — Anesthesia Procedure Notes (Signed)
Procedure Name: Intubation Date/Time: 02/17/2020 11:27 AM Performed by: Lance Coon, CRNA Pre-anesthesia Checklist: Patient identified, Emergency Drugs available, Suction available, Patient being monitored and Timeout performed Patient Re-evaluated:Patient Re-evaluated prior to induction Oxygen Delivery Method: Circle system utilized Preoxygenation: Pre-oxygenation with 100% oxygen Induction Type: IV induction Ventilation: Mask ventilation without difficulty and Oral airway inserted - appropriate to patient size Laryngoscope Size: Sabra Heck and 2 Grade View: Grade I Tube type: Oral Tube size: 7.0 mm Number of attempts: 1 Airway Equipment and Method: Stylet Placement Confirmation: ETT inserted through vocal cords under direct vision,  positive ETCO2 and breath sounds checked- equal and bilateral Secured at: 21 cm Tube secured with: Tape Dental Injury: Teeth and Oropharynx as per pre-operative assessment

## 2020-02-17 NOTE — Anesthesia Preprocedure Evaluation (Signed)
Anesthesia Evaluation  Patient identified by MRN, date of birth, ID band Patient awake    Reviewed: Allergy & Precautions, H&P , NPO status , Patient's Chart, lab work & pertinent test results, reviewed documented beta blocker date and time   Airway Mallampati: II  TM Distance: >3 FB Neck ROM: Full    Dental no notable dental hx. (+) Edentulous Upper, Edentulous Lower, Dental Advisory Given   Pulmonary neg pulmonary ROS,    Pulmonary exam normal breath sounds clear to auscultation       Cardiovascular hypertension, Pt. on medications and Pt. on home beta blockers negative cardio ROS   Rhythm:Regular Rate:Normal     Neuro/Psych negative neurological ROS  negative psych ROS   GI/Hepatic negative GI ROS, Neg liver ROS,   Endo/Other  Morbid obesity  Renal/GU negative Renal ROS  negative genitourinary   Musculoskeletal  (+) Arthritis , Osteoarthritis,    Abdominal   Peds  Hematology negative hematology ROS (+)   Anesthesia Other Findings   Reproductive/Obstetrics negative OB ROS                             Anesthesia Physical  Anesthesia Plan  ASA: III  Anesthesia Plan: General   Post-op Pain Management:    Induction: Intravenous  PONV Risk Score and Plan: 4 or greater and Ondansetron, Dexamethasone and Midazolam  Airway Management Planned: Oral ETT  Additional Equipment:   Intra-op Plan:   Post-operative Plan: Extubation in OR  Informed Consent: I have reviewed the patients History and Physical, chart, labs and discussed the procedure including the risks, benefits and alternatives for the proposed anesthesia with the patient or authorized representative who has indicated his/her understanding and acceptance.     Dental advisory given  Plan Discussed with: CRNA  Anesthesia Plan Comments: (PAT note written 01/18/2020 by Myra Gianotti, PA-C. )        Anesthesia  Quick Evaluation

## 2020-02-17 NOTE — H&P (Signed)
Lauren Santana is an 69 y.o. female.   Chief Complaint: Right diplopia and ptosis HPI: Patient with a 1 year history of right diplopia and ptosis.  Work-up including MRI scan showed enhancing lesion involving the right cavernous sinus and extending into the right maxillary sinus and inferior turbinate.  The patient underwent endoscopic biopsy of the right maxillary sinus with abnormal lymphoid tissue on pathology.  Unable to make specific diagnosis based on resected tissue.  Patient scheduled to undergo endoscopic biopsy of the right inferior turbinate for additional pathologic specimen.  Past Medical History:  Diagnosis Date  . Arthritis   . Edema    B/LLE  . Endometriosis   . High cholesterol   . Hypertension   . Numbness    feet  . Pre-diabetes   . Walker as ambulation aid   . Wears dentures   . Wears glasses     Past Surgical History:  Procedure Laterality Date  . ABDOMINAL HYSTERECTOMY     and BSO  . CATARACT EXTRACTION Left 10/2018  . [place]  . COLONOSCOPY    . EYE SURGERY Bilateral    CATARACT SX  . HERNIA REPAIR  2001   incisional  . JOINT REPLACEMENT Left    Knee  . MAXILLARY ANTROSTOMY Right 01/20/2020   Procedure: MAXILLARY ANTROSTOMY  WITH BIOPSY CALDWELL APPROACH;  Surgeon: Jerrell Belfast, MD;  Location: [place];  Service: ENT;  Laterality: Right;  . MULTIPLE TOOTH EXTRACTIONS  2011  . neck mass removal     "fatty tissue, not cancerous" per pt's son  . SINUS ENDO WITH FUSION Right 01/20/2020   Procedure: SINUS ENDO WITH FUSION WITH BIOSPY;  Surgeon: Jerrell Belfast, MD;  Location: [place];  Service: ENT;  Laterality: Right;  . TOTAL KNEE ARTHROPLASTY Left 07/17/2014   Procedure: TOTAL KNEE ARTHROPLASTY;  Surgeon: Kerin Salen, MD;  Location: [place];  Service: Orthopedics;  Laterality: Left;    Family History  Problem Relation Age of Onset  . Colon cancer Sister   . Cancer Sister        unsure of type of cancer   Social History:   reports that she has never smoked. She has never used smokeless tobacco. She reports that she does not drink alcohol or use drugs.  Allergies:  Allergies  Allergen Reactions  . Tylenol [Acetaminophen] Other (See Comments)    States "seems like eats my stomach"  . Sulfa Antibiotics Other (See Comments)    Unknown allergic reaction  . Sulfasalazine Other (See Comments)    Unknown allergic reaction Unknown allergic reaction    Medications Prior to Admission  Medication Sig Dispense Refill  . atorvastatin (LIPITOR) 10 MG tablet TAKE ONE TABLET BY MOUTH  ONCE DAILY IN THE EVENING (Patient taking differently: Take 10 mg by mouth every evening. ) 30 tablet 0  . furosemide (LASIX) 20 MG tablet Take 1 tablet (20 mg total) by mouth daily. (Patient taking differently: Take 20 mg by mouth every evening. ) 30 tablet 0  . gabapentin (NEURONTIN) 300 MG capsule Take 300 mg by mouth 3 (three) times daily.    Marland Kitchen labetalol (NORMODYNE) 100 MG tablet Take 100 mg by mouth 2 (two) times daily.    . medroxyPROGESTERone (PROVERA) 10 MG tablet Take 10 mg by mouth every evening.     . potassium chloride 20 MEQ/15ML (10%) SOLN Take 20 mEq by mouth daily.     . sucralfate (CARAFATE) 1 GM/10ML suspension Take 1  g by mouth 3 (three) times daily before meals.    . traMADol (ULTRAM) 50 MG tablet Take 50 mg by mouth 2 (two) times daily.  0  . diphenhydrAMINE (BENADRYL) 25 MG tablet Take 25 mg by mouth every 6 (six) hours as needed for itching or allergies.      Results for orders placed or performed during the hospital encounter of 02/15/20 (from the past 48 hour(s))  SARS CORONAVIRUS 2 (TAT 6-24 HRS) Nasopharyngeal Nasopharyngeal Swab     Status: None   Collection Time: 02/15/20  2:26 PM   Specimen: Nasopharyngeal Swab  Result Value Ref Range   SARS Coronavirus 2 NEGATIVE NEGATIVE    Comment: (NOTE) SARS-CoV-2 target nucleic acids are NOT DETECTED. The SARS-CoV-2 RNA is generally detectable in upper and  lower respiratory specimens during the acute phase of infection. Negative results do not preclude SARS-CoV-2 infection, do not rule out co-infections with other pathogens, and should not be used as the sole basis for treatment or other patient management decisions. Negative results must be combined with clinical observations, patient history, and epidemiological information. The expected result is Negative. Fact Sheet for Patients: SugarRoll.be Fact Sheet for Healthcare Providers: https://www.woods-mathews.com/ This test is not yet approved or cleared by the [place] FDA and  has been authorized for detection and/or diagnosis of SARS-CoV-2 by FDA under an Emergency Use Authorization (EUA). This EUA will remain  in effect (meaning this test can be used) for the duration of the COVID-19 declaration under Section 56 4(b)(1) of the Act, 21 U.S.C. section 360bbb-3(b)(1), unless the authorization is terminated or revoked sooner. Performed at [facility], [place] [address], [place], [place] [address]    No results found.  Review of Systems  Constitutional: Negative.   HENT: Negative.   Eyes: Positive for visual disturbance.    Blood pressure (!) 155/46, pulse 74, temperature 98.8 F (37.1 C), temperature source Oral, resp. rate 17, height 5\' 1"  (1.549 m), weight 92.5 kg, SpO2 100 %. Physical Exam  Constitutional: She appears well-developed and well-nourished.  HENT:  Patient status post biopsy of right inferior turbinate and maxillary sinus.  Cardiovascular: Normal rate.  Respiratory: Effort normal.  Musculoskeletal:     Cervical back: Normal range of motion and neck supple.     Assessment/Plan Patient for endoscopic biopsy of right inferior turbinate under general anesthesia at [facility] as an outpatient.  Jerrell Belfast, MD 02/17/2020, 11:05 AM

## 2020-02-17 NOTE — Op Note (Signed)
Operative Note:  ENDOSCOPIC NASAL BIOPSY      Patient: Lauren Santana [facility]  Medical record number: [id number]  Date:02/17/2020  Pre-operative Indications: 1.  Enhancing mass involving the right maxillary sinus and inferior turbinate       Postoperative Indications: Same  Surgical Procedure: 1.  Endoscopic biopsy of the right inferior turbinate      Anesthesia: GET  Surgeon: Delsa Bern, M.D.  Complications: None  EBL: Less than 50 cc  Findings: Previous biopsy site identified, large portion of the mid aspect of the right inferior turbinate resected and sent to pathology for lymphoma evaluation.   Brief History: The patient is a 69 y.o. female with a history of enhancing mass involving the right cavernous sinus with extension to the right maxillary sinus and inferior turbinate.  The patient had a history of ptosis and diplopia for 1 year.  She underwent previous biopsy of the right maxillary sinus and inferior turbinate the pathologic findings of lymphoid infiltrate, unable to analyze for lymphoma. Given the patient's history and findings, the above surgical procedures were recommended, risks and benefits were discussed in detail with the patient and family, they understand and agree with our plan for surgery which is scheduled at [place] under general anesthesia.  Surgical Procedure: The patient is brought to the operating room on 02/17/2020 and placed in supine position on the operating table. General anesthesia was established without difficulty. When the patient was adequately anesthetized, surgical timeout was performed with correct identification of the patient and the surgical procedure. The patient's nose was then packed with Afrin-soaked cottonoid pledgets were left in place for approximately 10 minutes lateral vasoconstriction and hemostasis.    With the patient prepped draped and prepared for surgery, nasal endoscopy was performed bilaterally.  The  patient's right inferior turbinate was injected with 3 cc of 1% lidocaine 1 100,000 dilution epinephrine, nasal passageway was packed with Afrin-soaked cottonoid pledgets which were left in place for approximately 10 minutes level vasoconstriction and hemostasis.  Findings include previous biopsy site in the mid aspect of the right inferior turbinate.  Endoscopic scissors were used to transect the midportion of the right inferior turbinate and resected entirely, anterior and posterior segments were left intact.  Soft tissue specimen was sent to pathology for gross and microscopic evaluation.  The patient's nasal cavity was reexamined and bleeding was controlled with monopolar suction cautery.  There was no further bleeding.  No packing was placed.  The patient's nose was treated with topical antibiotic cream.  Surgical sponge count was correct. An oral gastric tube was passed and the stomach contents were aspirated. Patient was awakened from anesthetic and transferred from the operating room to the recovery room in stable condition. There were no complications and blood loss was less than 50 cc.   Delsa Bern, M.D. [facility] 02/17/2020

## 2020-02-19 NOTE — Anesthesia Postprocedure Evaluation (Signed)
Anesthesia Post Note  Patient: Barbara Vea [facility]  Procedure(s) Performed: NASAL ENDOSCOPY WITH RIGHT INFERIOR TURNBINATE REDUCTION (Right Nose)     Patient location during evaluation: PACU Anesthesia Type: General Level of consciousness: awake and alert Pain management: pain level controlled Vital Signs Assessment: post-procedure vital signs reviewed and stable Respiratory status: spontaneous breathing, nonlabored ventilation, respiratory function stable and patient connected to nasal cannula oxygen Cardiovascular status: blood pressure returned to baseline and stable Postop Assessment: no apparent nausea or vomiting Anesthetic complications: no    Last Vitals:  Vitals:   02/17/20 1230 02/17/20 1235  BP:  136/65  Pulse: 77 75  Resp: 14 12  Temp:  37.2 C  SpO2: 99% 99%    Last Pain:  Vitals:   02/17/20 1230  TempSrc:   PainSc: 3                  Esmond Hinch

## 2020-02-22 LAB — SURGICAL PATHOLOGY

## 2020-03-02 ENCOUNTER — Encounter (HOSPITAL_COMMUNITY): Payer: Self-pay

## 2020-03-07 ENCOUNTER — Encounter (HOSPITAL_COMMUNITY): Payer: Self-pay

## 2020-03-08 DIAGNOSIS — E876 Hypokalemia: Secondary | ICD-10-CM | POA: Diagnosis not present

## 2020-03-08 DIAGNOSIS — I10 Essential (primary) hypertension: Secondary | ICD-10-CM | POA: Diagnosis not present

## 2020-03-15 ENCOUNTER — Ambulatory Visit: Payer: PPO | Admitting: Podiatry

## 2020-03-15 DIAGNOSIS — G894 Chronic pain syndrome: Secondary | ICD-10-CM | POA: Diagnosis not present

## 2020-03-15 DIAGNOSIS — M25562 Pain in left knee: Secondary | ICD-10-CM | POA: Diagnosis not present

## 2020-03-15 DIAGNOSIS — R1031 Right lower quadrant pain: Secondary | ICD-10-CM | POA: Diagnosis not present

## 2020-03-15 DIAGNOSIS — M25561 Pain in right knee: Secondary | ICD-10-CM | POA: Diagnosis not present

## 2020-03-20 LAB — SURGICAL PATHOLOGY

## 2020-03-22 DIAGNOSIS — Z90710 Acquired absence of both cervix and uterus: Secondary | ICD-10-CM | POA: Diagnosis not present

## 2020-03-22 DIAGNOSIS — N950 Postmenopausal bleeding: Secondary | ICD-10-CM | POA: Diagnosis not present

## 2020-03-22 DIAGNOSIS — Z79899 Other long term (current) drug therapy: Secondary | ICD-10-CM | POA: Diagnosis not present

## 2020-03-22 DIAGNOSIS — N809 Endometriosis, unspecified: Secondary | ICD-10-CM | POA: Diagnosis not present

## 2020-03-22 DIAGNOSIS — R109 Unspecified abdominal pain: Secondary | ICD-10-CM | POA: Diagnosis not present

## 2020-03-22 DIAGNOSIS — N898 Other specified noninflammatory disorders of vagina: Secondary | ICD-10-CM | POA: Diagnosis not present

## 2020-03-22 DIAGNOSIS — R102 Pelvic and perineal pain: Secondary | ICD-10-CM | POA: Diagnosis not present

## 2020-03-22 DIAGNOSIS — Z6836 Body mass index (BMI) 36.0-36.9, adult: Secondary | ICD-10-CM | POA: Diagnosis not present

## 2020-03-26 DIAGNOSIS — C8339 Diffuse large B-cell lymphoma, extranodal and solid organ sites: Secondary | ICD-10-CM | POA: Diagnosis not present

## 2020-03-26 DIAGNOSIS — C8590 Non-Hodgkin lymphoma, unspecified, unspecified site: Secondary | ICD-10-CM | POA: Diagnosis not present

## 2020-03-27 DIAGNOSIS — N839 Noninflammatory disorder of ovary, fallopian tube and broad ligament, unspecified: Secondary | ICD-10-CM | POA: Diagnosis not present

## 2020-03-27 DIAGNOSIS — N809 Endometriosis, unspecified: Secondary | ICD-10-CM | POA: Diagnosis not present

## 2020-04-02 DIAGNOSIS — J3489 Other specified disorders of nose and nasal sinuses: Secondary | ICD-10-CM | POA: Diagnosis not present

## 2020-04-02 DIAGNOSIS — D479 Neoplasm of uncertain behavior of lymphoid, hematopoietic and related tissue, unspecified: Secondary | ICD-10-CM | POA: Diagnosis not present

## 2020-04-02 DIAGNOSIS — D496 Neoplasm of unspecified behavior of brain: Secondary | ICD-10-CM | POA: Diagnosis not present

## 2020-04-05 DIAGNOSIS — J3489 Other specified disorders of nose and nasal sinuses: Secondary | ICD-10-CM | POA: Diagnosis not present

## 2020-04-05 DIAGNOSIS — C8339 Diffuse large B-cell lymphoma, extranodal and solid organ sites: Secondary | ICD-10-CM | POA: Diagnosis not present

## 2020-04-05 DIAGNOSIS — R590 Localized enlarged lymph nodes: Secondary | ICD-10-CM | POA: Diagnosis not present

## 2020-04-05 DIAGNOSIS — D496 Neoplasm of unspecified behavior of brain: Secondary | ICD-10-CM | POA: Diagnosis not present

## 2020-04-05 DIAGNOSIS — C8338 Diffuse large B-cell lymphoma, lymph nodes of multiple sites: Secondary | ICD-10-CM | POA: Diagnosis not present

## 2020-04-09 ENCOUNTER — Ambulatory Visit: Payer: PPO | Admitting: Podiatrist

## 2020-04-09 DIAGNOSIS — D496 Neoplasm of unspecified behavior of brain: Secondary | ICD-10-CM | POA: Diagnosis not present

## 2020-04-09 DIAGNOSIS — C8330 Diffuse large B-cell lymphoma, unspecified site: Secondary | ICD-10-CM | POA: Diagnosis not present

## 2020-04-11 DIAGNOSIS — Z780 Asymptomatic menopausal state: Secondary | ICD-10-CM | POA: Diagnosis not present

## 2020-04-11 DIAGNOSIS — N809 Endometriosis, unspecified: Secondary | ICD-10-CM | POA: Diagnosis not present

## 2020-04-12 DIAGNOSIS — G894 Chronic pain syndrome: Secondary | ICD-10-CM | POA: Diagnosis not present

## 2020-04-12 DIAGNOSIS — M25562 Pain in left knee: Secondary | ICD-10-CM | POA: Diagnosis not present

## 2020-04-12 DIAGNOSIS — R1031 Right lower quadrant pain: Secondary | ICD-10-CM | POA: Diagnosis not present

## 2020-04-12 DIAGNOSIS — M25561 Pain in right knee: Secondary | ICD-10-CM | POA: Diagnosis not present

## 2020-04-23 DIAGNOSIS — E785 Hyperlipidemia, unspecified: Secondary | ICD-10-CM | POA: Diagnosis not present

## 2020-04-23 DIAGNOSIS — E1169 Type 2 diabetes mellitus with other specified complication: Secondary | ICD-10-CM | POA: Diagnosis not present

## 2020-04-23 DIAGNOSIS — I10 Essential (primary) hypertension: Secondary | ICD-10-CM | POA: Diagnosis not present

## 2020-04-23 DIAGNOSIS — M159 Polyosteoarthritis, unspecified: Secondary | ICD-10-CM | POA: Diagnosis not present

## 2020-04-25 DIAGNOSIS — Z452 Encounter for adjustment and management of vascular access device: Secondary | ICD-10-CM | POA: Diagnosis not present

## 2020-04-25 DIAGNOSIS — Z79899 Other long term (current) drug therapy: Secondary | ICD-10-CM | POA: Diagnosis not present

## 2020-04-25 DIAGNOSIS — I081 Rheumatic disorders of both mitral and tricuspid valves: Secondary | ICD-10-CM | POA: Diagnosis not present

## 2020-04-25 DIAGNOSIS — C8330 Diffuse large B-cell lymphoma, unspecified site: Secondary | ICD-10-CM | POA: Diagnosis not present

## 2020-04-25 DIAGNOSIS — I517 Cardiomegaly: Secondary | ICD-10-CM | POA: Diagnosis not present

## 2020-04-25 DIAGNOSIS — I340 Nonrheumatic mitral (valve) insufficiency: Secondary | ICD-10-CM | POA: Diagnosis not present

## 2020-04-26 DIAGNOSIS — Z5111 Encounter for antineoplastic chemotherapy: Secondary | ICD-10-CM | POA: Diagnosis not present

## 2020-04-26 DIAGNOSIS — C8330 Diffuse large B-cell lymphoma, unspecified site: Secondary | ICD-10-CM | POA: Diagnosis not present

## 2020-05-14 DIAGNOSIS — C8330 Diffuse large B-cell lymphoma, unspecified site: Secondary | ICD-10-CM | POA: Diagnosis not present

## 2020-05-17 DIAGNOSIS — E876 Hypokalemia: Secondary | ICD-10-CM | POA: Diagnosis not present

## 2020-05-17 DIAGNOSIS — Z79899 Other long term (current) drug therapy: Secondary | ICD-10-CM | POA: Diagnosis not present

## 2020-05-17 DIAGNOSIS — Z5111 Encounter for antineoplastic chemotherapy: Secondary | ICD-10-CM | POA: Diagnosis not present

## 2020-05-17 DIAGNOSIS — C8330 Diffuse large B-cell lymphoma, unspecified site: Secondary | ICD-10-CM | POA: Diagnosis not present

## 2020-05-22 ENCOUNTER — Emergency Department (HOSPITAL_COMMUNITY)
Admission: EM | Admit: 2020-05-22 | Discharge: 2020-05-22 | Disposition: A | Discharge status: Home / Self Care | Payer: PPO | Attending: Emergency Medicine | Admitting: Emergency Medicine

## 2020-05-22 ENCOUNTER — Emergency Department (HOSPITAL_COMMUNITY): Payer: PPO

## 2020-05-22 ENCOUNTER — Other Ambulatory Visit: Payer: Self-pay

## 2020-05-22 ENCOUNTER — Encounter (HOSPITAL_COMMUNITY): Payer: Self-pay

## 2020-05-22 DIAGNOSIS — I10 Essential (primary) hypertension: Secondary | ICD-10-CM | POA: Insufficient documentation

## 2020-05-22 DIAGNOSIS — R52 Pain, unspecified: Secondary | ICD-10-CM | POA: Diagnosis not present

## 2020-05-22 DIAGNOSIS — R112 Nausea with vomiting, unspecified: Secondary | ICD-10-CM | POA: Diagnosis not present

## 2020-05-22 DIAGNOSIS — Z96652 Presence of left artificial knee joint: Secondary | ICD-10-CM | POA: Diagnosis not present

## 2020-05-22 DIAGNOSIS — K5730 Diverticulosis of large intestine without perforation or abscess without bleeding: Secondary | ICD-10-CM | POA: Diagnosis not present

## 2020-05-22 DIAGNOSIS — I700 Atherosclerosis of aorta: Secondary | ICD-10-CM | POA: Diagnosis not present

## 2020-05-22 DIAGNOSIS — S22088A Other fracture of T11-T12 vertebra, initial encounter for closed fracture: Secondary | ICD-10-CM | POA: Diagnosis not present

## 2020-05-22 DIAGNOSIS — C8330 Diffuse large B-cell lymphoma, unspecified site: Secondary | ICD-10-CM | POA: Insufficient documentation

## 2020-05-22 DIAGNOSIS — S36892A Contusion of other intra-abdominal organs, initial encounter: Secondary | ICD-10-CM | POA: Diagnosis not present

## 2020-05-22 DIAGNOSIS — M549 Dorsalgia, unspecified: Secondary | ICD-10-CM | POA: Diagnosis present

## 2020-05-22 DIAGNOSIS — S22008A Other fracture of unspecified thoracic vertebra, initial encounter for closed fracture: Secondary | ICD-10-CM

## 2020-05-22 DIAGNOSIS — R600 Localized edema: Secondary | ICD-10-CM | POA: Diagnosis not present

## 2020-05-22 DIAGNOSIS — Z79899 Other long term (current) drug therapy: Secondary | ICD-10-CM | POA: Insufficient documentation

## 2020-05-22 DIAGNOSIS — M545 Low back pain: Secondary | ICD-10-CM | POA: Diagnosis not present

## 2020-05-22 DIAGNOSIS — K439 Ventral hernia without obstruction or gangrene: Secondary | ICD-10-CM | POA: Diagnosis not present

## 2020-05-22 LAB — BASIC METABOLIC PANEL
Anion gap: 9 (ref 5–15)
BUN: 13 mg/dL (ref 8–23)
CO2: 27 mmol/L (ref 22–32)
Calcium: 8.6 mg/dL — ABNORMAL LOW (ref 8.9–10.3)
Chloride: 109 mmol/L (ref 98–111)
Creatinine, Ser: 0.65 mg/dL (ref 0.44–1.00)
GFR calc Af Amer: >60 mL/min (ref >60)
GFR calc non Af Amer: >60 mL/min (ref >60)
Glucose, Bld: 95 mg/dL (ref 70–99)
Potassium: 3.5 mmol/L (ref 3.5–5.1)
Sodium: 145 mmol/L (ref 135–145)

## 2020-05-22 LAB — CBC WITH DIFFERENTIAL/PLATELET
Abs Immature Granulocytes: 0.8 10*3/uL — ABNORMAL HIGH (ref 0.00–0.07)
Basophils Absolute: 0 10*3/uL (ref 0.0–0.1)
Basophils Relative: 0 %
Eosinophils Absolute: 0.4 10*3/uL (ref 0.0–0.5)
Eosinophils Relative: 2 %
HCT: 30 % — ABNORMAL LOW (ref 36.0–46.0)
Hemoglobin: 9.6 g/dL — ABNORMAL LOW (ref 12.0–15.0)
Lymphocytes Relative: 13 %
Lymphs Abs: 2.7 10*3/uL (ref 0.7–4.0)
MCH: 31.4 pg (ref 26.0–34.0)
MCHC: 32 g/dL (ref 30.0–36.0)
MCV: 98 fL (ref 80.0–100.0)
Metamyelocytes Relative: 3 %
Monocytes Absolute: 1.4 10*3/uL — ABNORMAL HIGH (ref 0.1–1.0)
Monocytes Relative: 7 %
Myelocytes: 1 %
Neutro Abs: 15.2 10*3/uL — ABNORMAL HIGH (ref 1.7–7.7)
Neutrophils Relative %: 74 %
Platelets: 81 10*3/uL — ABNORMAL LOW (ref 150–400)
RBC: 3.06 MIL/uL — ABNORMAL LOW (ref 3.87–5.11)
RDW: 14.9 % (ref 11.5–15.5)
WBC: 20.6 10*3/uL — ABNORMAL HIGH (ref 4.0–10.5)
nRBC: 0 % (ref 0.0–0.2)

## 2020-05-22 LAB — URINALYSIS, ROUTINE W REFLEX MICROSCOPIC
Bilirubin Urine: NEGATIVE
Glucose, UA: NEGATIVE mg/dL
Hgb urine dipstick: NEGATIVE
Ketones, ur: NEGATIVE mg/dL
Leukocytes,Ua: NEGATIVE
Nitrite: NEGATIVE
Protein, ur: NEGATIVE mg/dL
Specific Gravity, Urine: 1.014 (ref 1.005–1.030)
pH: 8 (ref 5.0–8.0)

## 2020-05-22 MED ORDER — METHOCARBAMOL 500 MG PO TABS
500.0000 mg | ORAL_TABLET | Freq: Once | ORAL | Status: AC
  Administered 2020-05-22: 500 mg via ORAL
  Filled 2020-05-22: qty 1

## 2020-05-22 MED ORDER — OXYCODONE HCL 5 MG PO TABS
5.0000 mg | ORAL_TABLET | Freq: Once | ORAL | Status: AC
  Administered 2020-05-22: 5 mg via ORAL
  Filled 2020-05-22: qty 1

## 2020-05-22 MED ORDER — LIDOCAINE 4 % EX PTCH: 1.0000 | MEDICATED_PATCH | Freq: Two times a day (BID) | CUTANEOUS | 0 refills | Status: DC

## 2020-05-22 MED ORDER — HEPARIN SOD (PORK) LOCK FLUSH 100 UNIT/ML IV SOLN: 500.0000 [IU] | Freq: Once | INTRAVENOUS | Status: AC

## 2020-05-22 MED ORDER — LIDOCAINE 5 % EX PTCH
1.0000 | MEDICATED_PATCH | CUTANEOUS | Status: DC
  Administered 2020-05-22: 1 via TRANSDERMAL
  Filled 2020-05-22: qty 1

## 2020-05-22 MED ORDER — OXYCODONE-ACETAMINOPHEN 5-325 MG PO TABS: 1.0000 | ORAL_TABLET | Freq: Three times a day (TID) | ORAL | 0 refills | Status: DC | PRN

## 2020-05-22 MED ORDER — IOHEXOL 300 MG/ML  SOLN
100.0000 mL | Freq: Once | INTRAMUSCULAR | Status: AC | PRN
  Administered 2020-05-22: 100 mL via INTRAVENOUS

## 2020-05-22 MED ORDER — ONDANSETRON 4 MG PO TBDP
4.0000 mg | ORAL_TABLET | Freq: Once | ORAL | Status: AC
  Administered 2020-05-22: 4 mg via ORAL
  Filled 2020-05-22: qty 1

## 2020-05-22 MED ORDER — ONDANSETRON 8 MG PO TBDP
8.0000 mg | ORAL_TABLET | Freq: Once | ORAL | Status: AC
  Administered 2020-05-22: 8 mg via ORAL
  Filled 2020-05-22: qty 1

## 2020-05-22 MED ORDER — OXYCODONE-ACETAMINOPHEN 5-325 MG PO TABS
1.0000 | ORAL_TABLET | Freq: Once | ORAL | Status: AC
  Administered 2020-05-22: 1 via ORAL
  Filled 2020-05-22: qty 1

## 2020-05-22 MED ORDER — FENTANYL CITRATE (PF) 100 MCG/2ML IJ SOLN
75.0000 ug | Freq: Once | INTRAMUSCULAR | Status: AC
  Administered 2020-05-22: 75 ug via INTRAVENOUS
  Filled 2020-05-22: qty 2

## 2020-05-22 MED ORDER — HEPARIN SOD (PORK) LOCK FLUSH 100 UNIT/ML IV SOLN
INTRAVENOUS | Status: AC
  Administered 2020-05-22: 500 [IU]
  Filled 2020-05-22: qty 5

## 2020-05-22 MED ORDER — SODIUM CHLORIDE (PF) 0.9 % IJ SOLN
INTRAMUSCULAR | Status: AC
  Filled 2020-05-22: qty 50

## 2020-05-22 NOTE — ED Notes (Signed)
Lauren Santana has been drawn off port x2. Lab is stating that it clotted both times. Called phlebotomy for blood draw.
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
## 2020-05-22 NOTE — ED Notes (Signed)
Port accessed. Brief changed. Pt placed on bedpan.

## 2020-05-22 NOTE — Progress Notes (Signed)
Orthopedic Tech Progress Note Patient Details:  Lauren Santana [facility] 1951/02/26 [id number]  Patient ID: Lauren Santana, female   DOB: 1950-12-27, 69 y.o.   MRN: [id number]   Lauren Santana 05/22/2020, 9:06 PM TLSO brace ordered from [employer] @2105 .

## 2020-05-22 NOTE — ED Notes (Signed)
Pt being transported to xray. When giving pain meds, pt asked is there anything else I was going to do, I informed her she would be getting an xr. Pt states that she is having emesis as well.
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
## 2020-05-24 DIAGNOSIS — M545 Low back pain: Secondary | ICD-10-CM | POA: Diagnosis not present

## 2020-05-24 DIAGNOSIS — Y998 Other external cause status: Secondary | ICD-10-CM | POA: Diagnosis not present

## 2020-05-24 DIAGNOSIS — R112 Nausea with vomiting, unspecified: Secondary | ICD-10-CM | POA: Diagnosis not present

## 2020-05-24 DIAGNOSIS — M5135 Other intervertebral disc degeneration, thoracolumbar region: Secondary | ICD-10-CM | POA: Diagnosis not present

## 2020-05-24 DIAGNOSIS — M4316 Spondylolisthesis, lumbar region: Secondary | ICD-10-CM | POA: Diagnosis not present

## 2020-05-24 DIAGNOSIS — Y92410 Unspecified street and highway as the place of occurrence of the external cause: Secondary | ICD-10-CM | POA: Diagnosis not present

## 2020-05-28 ENCOUNTER — Other Ambulatory Visit: Payer: Self-pay

## 2020-05-28 ENCOUNTER — Emergency Department (HOSPITAL_COMMUNITY): Payer: PPO

## 2020-05-28 ENCOUNTER — Encounter (HOSPITAL_COMMUNITY): Payer: Self-pay

## 2020-05-28 ENCOUNTER — Emergency Department (HOSPITAL_COMMUNITY)
Admission: EM | Admit: 2020-05-28 | Discharge: 2020-05-30 | Disposition: A | Discharge status: Home / Self Care | Payer: PPO | Attending: Emergency Medicine | Admitting: Emergency Medicine

## 2020-05-28 DIAGNOSIS — M546 Pain in thoracic spine: Secondary | ICD-10-CM | POA: Diagnosis present

## 2020-05-28 DIAGNOSIS — I10 Essential (primary) hypertension: Secondary | ICD-10-CM | POA: Insufficient documentation

## 2020-05-28 DIAGNOSIS — Z85840 Personal history of malignant neoplasm of eye: Secondary | ICD-10-CM | POA: Insufficient documentation

## 2020-05-28 DIAGNOSIS — Z20822 Contact with and (suspected) exposure to covid-19: Secondary | ICD-10-CM | POA: Insufficient documentation

## 2020-05-28 DIAGNOSIS — S22088D Other fracture of T11-T12 vertebra, subsequent encounter for fracture with routine healing: Secondary | ICD-10-CM

## 2020-05-28 DIAGNOSIS — Z79899 Other long term (current) drug therapy: Secondary | ICD-10-CM | POA: Insufficient documentation

## 2020-05-28 DIAGNOSIS — S22008D Other fracture of unspecified thoracic vertebra, subsequent encounter for fracture with routine healing: Secondary | ICD-10-CM

## 2020-05-28 DIAGNOSIS — Z7409 Other reduced mobility: Secondary | ICD-10-CM

## 2020-05-28 DIAGNOSIS — I517 Cardiomegaly: Secondary | ICD-10-CM | POA: Diagnosis not present

## 2020-05-28 DIAGNOSIS — Z96652 Presence of left artificial knee joint: Secondary | ICD-10-CM | POA: Diagnosis not present

## 2020-05-28 DIAGNOSIS — S22089D Unspecified fracture of T11-T12 vertebra, subsequent encounter for fracture with routine healing: Secondary | ICD-10-CM | POA: Diagnosis not present

## 2020-05-28 DIAGNOSIS — S22009D Unspecified fracture of unspecified thoracic vertebra, subsequent encounter for fracture with routine healing: Secondary | ICD-10-CM | POA: Insufficient documentation

## 2020-05-28 DIAGNOSIS — Z789 Other specified health status: Secondary | ICD-10-CM

## 2020-05-28 LAB — COMPREHENSIVE METABOLIC PANEL
ALT: 17 U/L (ref 0–44)
AST: 19 U/L (ref 15–41)
Albumin: 3.2 g/dL — ABNORMAL LOW (ref 3.5–5.0)
Alkaline Phosphatase: 113 U/L (ref 38–126)
Anion gap: 9 (ref 5–15)
BUN: 7 mg/dL — ABNORMAL LOW (ref 8–23)
CO2: 28 mmol/L (ref 22–32)
Calcium: 8.7 mg/dL — ABNORMAL LOW (ref 8.9–10.3)
Chloride: 106 mmol/L (ref 98–111)
Creatinine, Ser: 0.78 mg/dL (ref 0.44–1.00)
GFR calc Af Amer: >60 mL/min (ref >60)
GFR calc non Af Amer: >60 mL/min (ref >60)
Glucose, Bld: 92 mg/dL (ref 70–99)
Potassium: 3.3 mmol/L — ABNORMAL LOW (ref 3.5–5.1)
Sodium: 143 mmol/L (ref 135–145)
Total Bilirubin: 0.6 mg/dL (ref 0.3–1.2)
Total Protein: 6 g/dL — ABNORMAL LOW (ref 6.5–8.1)

## 2020-05-28 LAB — CBC WITH DIFFERENTIAL/PLATELET
Abs Immature Granulocytes: 0.28 10*3/uL — ABNORMAL HIGH (ref 0.00–0.07)
Basophils Absolute: 0.1 10*3/uL (ref 0.0–0.1)
Basophils Relative: 1 %
Eosinophils Absolute: 0.1 10*3/uL (ref 0.0–0.5)
Eosinophils Relative: 1 %
HCT: 34.6 % — ABNORMAL LOW (ref 36.0–46.0)
Hemoglobin: 10.8 g/dL — ABNORMAL LOW (ref 12.0–15.0)
Immature Granulocytes: 2 %
Lymphocytes Relative: 17 %
Lymphs Abs: 2.4 10*3/uL (ref 0.7–4.0)
MCH: 30.4 pg (ref 26.0–34.0)
MCHC: 31.2 g/dL (ref 30.0–36.0)
MCV: 97.5 fL (ref 80.0–100.0)
Monocytes Absolute: 1.2 10*3/uL — ABNORMAL HIGH (ref 0.1–1.0)
Monocytes Relative: 8 %
Neutro Abs: 9.8 10*3/uL — ABNORMAL HIGH (ref 1.7–7.7)
Neutrophils Relative %: 71 %
Platelets: 114 10*3/uL — ABNORMAL LOW (ref 150–400)
RBC: 3.55 MIL/uL — ABNORMAL LOW (ref 3.87–5.11)
RDW: 15.3 % (ref 11.5–15.5)
WBC: 13.9 10*3/uL — ABNORMAL HIGH (ref 4.0–10.5)
nRBC: 0 % (ref 0.0–0.2)

## 2020-05-28 LAB — SARS CORONAVIRUS 2 BY RT PCR (HOSPITAL ORDER, PERFORMED IN ~~LOC~~ HOSPITAL LAB): SARS Coronavirus 2: NEGATIVE

## 2020-05-28 LAB — URINALYSIS, ROUTINE W REFLEX MICROSCOPIC
Bilirubin Urine: NEGATIVE
Glucose, UA: NEGATIVE mg/dL
Hgb urine dipstick: NEGATIVE
Ketones, ur: NEGATIVE mg/dL
Leukocytes,Ua: NEGATIVE
Nitrite: NEGATIVE
Protein, ur: NEGATIVE mg/dL
Specific Gravity, Urine: 1.015 (ref 1.005–1.030)
pH: 7 (ref 5.0–8.0)

## 2020-05-28 MED ORDER — HYDROMORPHONE HCL 1 MG/ML IJ SOLN
0.5000 mg | Freq: Once | INTRAMUSCULAR | Status: AC
  Administered 2020-05-28: 0.5 mg via INTRAMUSCULAR
  Filled 2020-05-28: qty 1

## 2020-05-28 MED ORDER — ONDANSETRON 4 MG PO TBDP
4.0000 mg | ORAL_TABLET | Freq: Four times a day (QID) | ORAL | Status: DC | PRN
  Administered 2020-05-28 – 2020-05-29 (×3): 4 mg via ORAL
  Filled 2020-05-28 (×3): qty 1

## 2020-05-28 MED ORDER — FUROSEMIDE 40 MG PO TABS
20.0000 mg | ORAL_TABLET | Freq: Every evening | ORAL | Status: DC
  Administered 2020-05-29: 20 mg via ORAL
  Filled 2020-05-28 (×2): qty 1

## 2020-05-28 MED ORDER — OXYCODONE-ACETAMINOPHEN 5-325 MG PO TABS
1.0000 | ORAL_TABLET | ORAL | Status: DC | PRN
  Administered 2020-05-28: 1 via ORAL
  Filled 2020-05-28: qty 1

## 2020-05-28 MED ORDER — DOCUSATE SODIUM 100 MG PO CAPS
100.0000 mg | ORAL_CAPSULE | Freq: Two times a day (BID) | ORAL | Status: DC
  Administered 2020-05-28 – 2020-05-30 (×3): 100 mg via ORAL
  Filled 2020-05-28 (×3): qty 1

## 2020-05-28 MED ORDER — OXYCODONE-ACETAMINOPHEN 5-325 MG PO TABS
1.0000 | ORAL_TABLET | Freq: Four times a day (QID) | ORAL | Status: DC | PRN
  Administered 2020-05-29 – 2020-05-30 (×5): 2 via ORAL
  Filled 2020-05-28 (×5): qty 2

## 2020-05-28 MED ORDER — FAMOTIDINE 20 MG PO TABS
20.0000 mg | ORAL_TABLET | Freq: Two times a day (BID) | ORAL | Status: DC
  Administered 2020-05-29 – 2020-05-30 (×3): 20 mg via ORAL
  Filled 2020-05-28 (×4): qty 1

## 2020-05-28 MED ORDER — ATORVASTATIN CALCIUM 10 MG PO TABS
10.0000 mg | ORAL_TABLET | Freq: Every evening | ORAL | Status: DC
  Administered 2020-05-28 – 2020-05-29 (×2): 10 mg via ORAL
  Filled 2020-05-28 (×2): qty 1

## 2020-05-28 MED ORDER — ALUM & MAG HYDROXIDE-SIMETH 200-200-20 MG/5ML PO SUSP
30.0000 mL | Freq: Four times a day (QID) | ORAL | Status: DC | PRN
  Administered 2020-05-28 – 2020-05-30 (×6): 30 mL via ORAL
  Filled 2020-05-28 (×6): qty 30

## 2020-05-28 MED ORDER — LABETALOL HCL 100 MG PO TABS
100.0000 mg | ORAL_TABLET | Freq: Two times a day (BID) | ORAL | Status: DC
  Administered 2020-05-28 – 2020-05-30 (×3): 100 mg via ORAL
  Filled 2020-05-28 (×4): qty 1

## 2020-05-28 MED ORDER — IBUPROFEN 200 MG PO TABS
400.0000 mg | ORAL_TABLET | Freq: Three times a day (TID) | ORAL | Status: DC | PRN
  Administered 2020-05-28 – 2020-05-30 (×3): 400 mg via ORAL
  Filled 2020-05-28 (×3): qty 2
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
## 2020-05-28 NOTE — Progress Notes (Addendum)
CSW went to pt's room and provided pt 2 copies of the list of SNF's within a 25 mile radius from the Medicare.[place] website and explained how SNF's are graded not only by quality but also due to a variety or lack of variety of services offered from any given facility or facilities.  CSW also explained that one copy was for the pt and one copy was for the pt's son [name] at ph: [phone].  2nd shift ED CSW will leave handoff for 1st shift ED CSW.  Pt voiced understanding.  CSW will continue to follow for D/C needs.  Alphonse Guild. Shyra Emile  MSW, LCSW, LCAS, CCS Transitions of Care Clinical Social Worker Care Coordination Department Ph: [phone]

## 2020-05-28 NOTE — Progress Notes (Signed)
Per [facility] MUST pt has a PASRR:  [id number]  CSW will continue to follow for D/C needs.  Alphonse Guild. Kathleen Tamm  MSW, LCSW, LCAS, CCS Transitions of Care Clinical Social Worker Care Coordination Department Ph: [phone]

## 2020-05-28 NOTE — NC FL2 (Signed)
[facility] LEVEL OF CARE SCREENING TOOL     IDENTIFICATION  Patient Name: Lauren Santana Birthdate: [birth year] Sex: female Admission Date (Current Location): 05/28/2020  [facility] and [place] Number:  Herbalist and Address:         Provider Number: [id number]  Attending Physician Name and Address:  Charlesetta Shanks, MD  Relative Name and Phone Number:       Current Level of Care: Hospital Recommended Level of Care: [place] Prior Approval Number:    Date Approved/Denied: 07/18/14 PASRR Number: [id number]  Discharge Plan: SNF    Current Diagnoses: Patient Active Problem List   Diagnosis Date Noted  . 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 S/P BSO (bilateral salpingo-oophorectomy) 01/08/2017  . S/P hysterectomy 01/08/2017  . Tachycardia   . Acute deep vein thrombosis (DVT) of right lower extremity ([place]) 02/14/2016  . Pulmonary emboli ([place]) 02/13/2016  . PE (pulmonary embolism) 02/13/2016  . AKI (acute kidney injury) ([place]) 02/13/2016  . Pulmonary embolism with acute cor pulmonale ([place])   . Pelvic mass in female   . Morbid obesity with BMI of 40.0-44.9, adult ([place]) 08/20/2015  . 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 Essential hypertension, benign 07/23/2014  . Edema 07/23/2014  . Dyslipidemia 07/23/2014  . Lower extremity numbness 07/23/2014  . 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 Postmenopausal bleeding 03/10/2012  . Endometriosis of pelvis 01/22/2007    Orientation RESPIRATION BLADDER Height & Weight     Self, Time, Situation, Place  Normal Continent Weight: 240 lb (108.9 kg) Height:  5\' 1"  (154.9 cm)  BEHAVIORAL SYMPTOMS/MOOD NEUROLOGICAL BOWEL NUTRITION STATUS      Continent Diet (Regular, but had soft foods only as a personal choice)  AMBULATORY STATUS COMMUNICATION OF NEEDS Skin   Extensive Assist Verbally Normal                       Personal Care Assistance Level of Assistance   Bathing, Dressing Bathing Assistance: Limited assistance   Dressing Assistance: Limited assistance     Functional Limitations Info             SPECIAL CARE FACTORS FREQUENCY  PT (By licensed PT), OT (By licensed OT)     PT Frequency: 5 OT Frequency: 5            Contractures Contractures Info: Not present    Additional Factors Info  Code Status, Allergies Code Status Info: FULL CODE Allergies Info: Tylenol (Acetaminophen), Sulfa Antibiotics           Current Medications (05/28/2020):  This is the current hospital active medication list Current Facility-Administered Medications  Medication Dose Route Frequency Provider Last Rate Last Admin  . alum & mag hydroxide-simeth (MAALOX/MYLANTA) 200-200-20 MG/5ML suspension 30 mL  30 mL Oral Q6H PRN Charlesetta Shanks, MD      . atorvastatin (LIPITOR) tablet 10 mg  10 mg Oral QPM Charlesetta Shanks, MD   10 mg at 05/28/20 1749  . docusate sodium (COLACE) capsule 100 mg  100 mg Oral BID Charlesetta Shanks, MD      . famotidine (PEPCID) tablet 20 mg  20 mg Oral BID Charlesetta Shanks, MD      . furosemide (LASIX) tablet 20 mg  20 mg Oral QPM Pfeiffer, Marcy, MD      . ibuprofen (ADVIL) tablet 400 mg  400 mg Oral Q8H PRN Charlesetta Shanks, MD   400 mg at 05/28/20 2044  .  labetalol (NORMODYNE) tablet 100 mg  100 mg Oral BID Charlesetta Shanks, MD   100 mg at 05/28/20 1749  . ondansetron (ZOFRAN-ODT) disintegrating tablet 4 mg  4 mg Oral Q6H PRN Charlesetta Shanks, MD   4 mg at 05/28/20 2102  . oxyCODONE-acetaminophen (PERCOCET/ROXICET) 5-325 MG per tablet 1 tablet  1 tablet Oral Q4H PRN Charlesetta Shanks, MD   1 tablet at 05/28/20 1749   Current Outpatient Medications  Medication Sig Dispense Refill  . atorvastatin (LIPITOR) 10 MG tablet TAKE ONE TABLET BY MOUTH  ONCE DAILY IN THE EVENING (Patient taking differently: Take 10 mg by mouth every evening. ) 30 tablet 0  . diphenhydrAMINE (BENADRYL) 25 MG tablet Take 25 mg by mouth every 6 (six) hours as  needed for itching or allergies.    . furosemide (LASIX) 20 MG tablet Take 1 tablet (20 mg total) by mouth daily. 30 tablet 0  . gabapentin (NEURONTIN) 300 MG capsule Take 300 mg by mouth 3 (three) times daily.    Marland Kitchen labetalol (NORMODYNE) 100 MG tablet Take 100 mg by mouth 2 (two) times daily.    . medroxyPROGESTERone (PROVERA) 10 MG tablet Take 10 mg by mouth every evening.     . ondansetron (ZOFRAN) 4 MG tablet Take 4 mg by mouth every 8 (eight) hours as needed for nausea.     Marland Kitchen oxyCODONE (OXY IR/ROXICODONE) 5 MG immediate release tablet Take 5 mg by mouth every 6 (six) hours as needed.    . potassium chloride 20 MEQ/15ML (10%) SOLN Take 20 mEq by mouth daily.     . prochlorperazine (COMPAZINE) 5 MG tablet Take 5 mg by mouth daily as needed for nausea or vomiting.     . sucralfate (CARAFATE) 1 GM/10ML suspension Take 1 g by mouth 3 (three) times daily before meals.    . Lidocaine 4 % PTCH Apply 1 patch topically 2 (two) times daily. (Patient not taking: Reported on 05/28/2020) 12 patch 0  . oxyCODONE-acetaminophen (PERCOCET/ROXICET) 5-325 MG tablet Take 1 tablet by mouth every 8 (eight) hours as needed for severe pain. (Patient not taking: Reported on 05/28/2020) 12 tablet 0     Discharge Medications: Please see discharge summary for a list of discharge medications.  Relevant Imaging Results:  Relevant Lab Results:   Additional Information [id number]  Alphonse Guild Isyss Espinal, LCSW

## 2020-05-28 NOTE — Progress Notes (Signed)
Assessment/signed FL-2 created and referrals sent to [facility].  Pt prefers [facility] or [facility]/pt's son prefers [facility].  Pt asks that both pt and pt's son [name] (at ph: [phone]) be co-decision makers in the disposition process.  CSW will continue to follow for D/C needs.  Alphonse Guild. Genise Strack  MSW, LCSW, LCAS, CCS Transitions of Care Clinical Social Worker Care Coordination Department Ph: [phone]
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
## 2020-05-28 NOTE — Progress Notes (Addendum)
Consult request has been received. CSW attempting to follow up at present time.  CSW counseled pt/pt's son on SNF's, and how it works, who is qualified to go there, insurances that do and don't pay for them and how it is paid for otherwise.  Pt stated she was at [facility] in the past, is interested in [facility] and pt's son inquired about [facility].  CSW offered t provide pt/pt's son with a list of Skilled Nursing Facilities in the [place] area and counseled pt/pt's son on seeking admission into a SNF for rehab and how admission is attained.  Pt/pt's son was appreciative was appreciative and thanked the CSW.  CSW spoke with pt and confirmed pt's plan to be discharged to SNF to for rehab at discharge.  CSW provided active listening and validated pt's concerns that pt's son [name] is equally aware of all parts of discharge planning and that both be equally updated and advised.   CSW was given verbal permission to complete an FL-2 and send referrals out to SNF facilities in [place] area, via the hub, per pt's request.  Pt has been living independently prior to being admitted to [facility] ED.  Pt/pt's son were counseled that due to time constraints the pt as a necessity of the emergency department will have to take one of the first available offers the pt receives and that while the [facility] Dept will work very hard to provide the pt/pt's family with as many choices as possible due to a limited number of open beds available on any given day this is not always possible and the CSW would like to prepare pt/pt's family for any eventuality rather than provide false hope.  Pt/pt's son voiced understanding and thankes the CSW.  CSW will continue to follow for D/C needs.  Alphonse Guild. Jennet Scroggin  MSW, LCSW, LCAS, CCS Transitions of Care Clinical Social Worker Care Coordination Department Ph: [phone]
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
## 2020-05-29 NOTE — Progress Notes (Signed)
Per HTA, pt's [name] is pending.    CSW will continue to follow for D/C needs.  Alphonse Guild. Samarth Ogle  MSW, LCSW, LCAS, CCS Transitions of Care Clinical Social Worker Care Coordination Department Ph: [phone]

## 2020-05-29 NOTE — ED Notes (Signed)
Breakfast tray given. °
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
## 2020-05-29 NOTE — Social Work (Signed)
TOC CSW started authorization for [facility] with non-emergent transport ([facility] @ [facility] [phone]).  Auth is pending on PT notes.  Pts son [name] wanted more information about [employer] visitation.    CSW is handing off to 2nd shift to continue to follow.  Veyda Kaufman Tarpley-Carter, MSW, [place] ED Transitions of Engineer, building services Health [phone]

## 2020-05-29 NOTE — ED Notes (Signed)
Son updated via phone.  Will bring patient's brace so she can work with OT.  Patietn had told OT this morning she would have family bring brace however she had not actually asked anyone to bring it.

## 2020-05-29 NOTE — Progress Notes (Signed)
CSW updated pt's son [name].  CSW will continue to follow for D/C needs.  Alphonse Guild. Ayeza Therriault  MSW, LCSW, LCAS, CCS Transitions of Care Clinical Social Worker Care Coordination Department Ph: [phone]
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
## 2020-05-29 NOTE — ED Notes (Signed)
Setup placed at bedside for patient to wash herself up.

## 2020-05-29 NOTE — Progress Notes (Signed)
Olivia Mackie at [facility] stated pt's referral will have to be looked at by the admins on 05/30/20 and they will call back with a decision on 7/7.  Per [facility] ins auth still pending.  CSW will continue to follow for D/C needs.  Alphonse Guild. Jisele Price  MSW, LCSW, LCAS, CCS Transitions of Care Clinical Social Worker Care Coordination Department Ph: [phone]

## 2020-05-29 NOTE — ED Notes (Signed)
Meal tray given 

## 2020-05-30 DIAGNOSIS — S22009A Unspecified fracture of unspecified thoracic vertebra, initial encounter for closed fracture: Secondary | ICD-10-CM | POA: Diagnosis not present

## 2020-05-30 DIAGNOSIS — I10 Essential (primary) hypertension: Secondary | ICD-10-CM | POA: Diagnosis not present

## 2020-05-30 DIAGNOSIS — N809 Endometriosis, unspecified: Secondary | ICD-10-CM | POA: Diagnosis not present

## 2020-05-30 DIAGNOSIS — G5793 Unspecified mononeuropathy of bilateral lower limbs: Secondary | ICD-10-CM | POA: Diagnosis not present

## 2020-05-30 DIAGNOSIS — C8330 Diffuse large B-cell lymphoma, unspecified site: Secondary | ICD-10-CM | POA: Diagnosis not present

## 2020-05-30 DIAGNOSIS — G4700 Insomnia, unspecified: Secondary | ICD-10-CM | POA: Diagnosis not present

## 2020-05-30 DIAGNOSIS — S22008D Other fracture of unspecified thoracic vertebra, subsequent encounter for fracture with routine healing: Secondary | ICD-10-CM | POA: Diagnosis not present

## 2020-05-30 DIAGNOSIS — M2550 Pain in unspecified joint: Secondary | ICD-10-CM | POA: Diagnosis not present

## 2020-05-30 DIAGNOSIS — M6281 Muscle weakness (generalized): Secondary | ICD-10-CM | POA: Diagnosis not present

## 2020-05-30 DIAGNOSIS — D4989 Neoplasm of unspecified behavior of other specified sites: Secondary | ICD-10-CM | POA: Diagnosis not present

## 2020-05-30 DIAGNOSIS — I959 Hypotension, unspecified: Secondary | ICD-10-CM | POA: Diagnosis not present

## 2020-05-30 DIAGNOSIS — G894 Chronic pain syndrome: Secondary | ICD-10-CM | POA: Diagnosis not present

## 2020-05-30 DIAGNOSIS — Z7401 Bed confinement status: Secondary | ICD-10-CM | POA: Diagnosis not present

## 2020-05-30 DIAGNOSIS — M5440 Lumbago with sciatica, unspecified side: Secondary | ICD-10-CM | POA: Diagnosis not present

## 2020-05-30 DIAGNOSIS — S22089D Unspecified fracture of T11-T12 vertebra, subsequent encounter for fracture with routine healing: Secondary | ICD-10-CM | POA: Diagnosis not present

## 2020-05-30 DIAGNOSIS — R112 Nausea with vomiting, unspecified: Secondary | ICD-10-CM | POA: Diagnosis not present

## 2020-05-30 DIAGNOSIS — M549 Dorsalgia, unspecified: Secondary | ICD-10-CM | POA: Diagnosis not present

## 2020-05-30 DIAGNOSIS — S22088D Other fracture of T11-T12 vertebra, subsequent encounter for fracture with routine healing: Secondary | ICD-10-CM | POA: Diagnosis not present

## 2020-05-30 DIAGNOSIS — Z5111 Encounter for antineoplastic chemotherapy: Secondary | ICD-10-CM | POA: Diagnosis not present

## 2020-05-30 DIAGNOSIS — C8510 Unspecified B-cell lymphoma, unspecified site: Secondary | ICD-10-CM | POA: Diagnosis not present

## 2020-05-30 DIAGNOSIS — F05 Delirium due to known physiological condition: Secondary | ICD-10-CM | POA: Diagnosis not present

## 2020-05-30 DIAGNOSIS — E785 Hyperlipidemia, unspecified: Secondary | ICD-10-CM | POA: Diagnosis not present

## 2020-05-30 DIAGNOSIS — G4701 Insomnia due to medical condition: Secondary | ICD-10-CM | POA: Diagnosis not present

## 2020-05-30 DIAGNOSIS — R2689 Other abnormalities of gait and mobility: Secondary | ICD-10-CM | POA: Diagnosis not present

## 2020-05-30 DIAGNOSIS — R531 Weakness: Secondary | ICD-10-CM | POA: Diagnosis not present

## 2020-05-30 DIAGNOSIS — R262 Difficulty in walking, not elsewhere classified: Secondary | ICD-10-CM | POA: Diagnosis not present

## 2020-05-30 DIAGNOSIS — R5381 Other malaise: Secondary | ICD-10-CM | POA: Diagnosis not present

## 2020-05-30 DIAGNOSIS — F411 Generalized anxiety disorder: Secondary | ICD-10-CM | POA: Diagnosis not present

## 2020-05-30 NOTE — ED Notes (Signed)
PT provided meal tray, PT expressed she did not like the spaghetti, PT provided chicken noodle soup and crackers, PT advised to be careful with the hot soup.

## 2020-05-30 NOTE — ED Notes (Signed)
PT provided bath supplies, toothbrush, lotion, for self bath and care.

## 2020-05-30 NOTE — ED Provider Notes (Signed)
Pt has been accepted by [facility].  Pt is stable for d/c.   Isla Pence, MD 05/30/20 1530

## 2020-05-30 NOTE — ED Notes (Signed)
PT inquired about pain medication.

## 2020-05-30 NOTE — ED Notes (Addendum)
Report called to [facility], LPN at [facility]. Awaiting [facility] arrival.

## 2020-05-30 NOTE — ED Notes (Signed)
PT was provided perineal care, purewick removed, PT stand and pivoted leaned against the bed and washed self and provided self bath. New sheets and linens applied to bed. PT placed in new brief. PT placed in bed and repositioned to comfort. New purewick applied.

## 2020-06-01 DIAGNOSIS — S22008D Other fracture of unspecified thoracic vertebra, subsequent encounter for fracture with routine healing: Secondary | ICD-10-CM | POA: Diagnosis not present

## 2020-06-01 DIAGNOSIS — C8330 Diffuse large B-cell lymphoma, unspecified site: Secondary | ICD-10-CM | POA: Diagnosis not present

## 2020-06-01 DIAGNOSIS — R112 Nausea with vomiting, unspecified: Secondary | ICD-10-CM | POA: Diagnosis not present

## 2020-06-01 DIAGNOSIS — M5440 Lumbago with sciatica, unspecified side: Secondary | ICD-10-CM | POA: Diagnosis not present

## 2020-06-05 DIAGNOSIS — R262 Difficulty in walking, not elsewhere classified: Secondary | ICD-10-CM | POA: Diagnosis not present

## 2020-06-05 DIAGNOSIS — M549 Dorsalgia, unspecified: Secondary | ICD-10-CM | POA: Diagnosis not present

## 2020-06-05 DIAGNOSIS — R5381 Other malaise: Secondary | ICD-10-CM | POA: Diagnosis not present

## 2020-06-07 DIAGNOSIS — M549 Dorsalgia, unspecified: Secondary | ICD-10-CM | POA: Diagnosis not present

## 2020-06-07 DIAGNOSIS — C8330 Diffuse large B-cell lymphoma, unspecified site: Secondary | ICD-10-CM | POA: Diagnosis not present

## 2020-06-07 DIAGNOSIS — S22089D Unspecified fracture of T11-T12 vertebra, subsequent encounter for fracture with routine healing: Secondary | ICD-10-CM | POA: Diagnosis not present

## 2020-06-07 DIAGNOSIS — I10 Essential (primary) hypertension: Secondary | ICD-10-CM | POA: Diagnosis not present

## 2020-06-07 DIAGNOSIS — C8510 Unspecified B-cell lymphoma, unspecified site: Secondary | ICD-10-CM | POA: Diagnosis not present

## 2020-06-07 DIAGNOSIS — R531 Weakness: Secondary | ICD-10-CM | POA: Diagnosis not present

## 2020-06-07 DIAGNOSIS — Z5111 Encounter for antineoplastic chemotherapy: Secondary | ICD-10-CM | POA: Diagnosis not present

## 2020-06-08 DIAGNOSIS — S22009A Unspecified fracture of unspecified thoracic vertebra, initial encounter for closed fracture: Secondary | ICD-10-CM | POA: Diagnosis not present

## 2020-06-11 DIAGNOSIS — S22008D Other fracture of unspecified thoracic vertebra, subsequent encounter for fracture with routine healing: Secondary | ICD-10-CM | POA: Diagnosis not present

## 2020-06-11 DIAGNOSIS — C8330 Diffuse large B-cell lymphoma, unspecified site: Secondary | ICD-10-CM | POA: Diagnosis not present

## 2020-06-11 DIAGNOSIS — M5440 Lumbago with sciatica, unspecified side: Secondary | ICD-10-CM | POA: Diagnosis not present

## 2020-06-11 DIAGNOSIS — R112 Nausea with vomiting, unspecified: Secondary | ICD-10-CM | POA: Diagnosis not present

## 2020-06-12 DIAGNOSIS — G894 Chronic pain syndrome: Secondary | ICD-10-CM | POA: Diagnosis not present

## 2020-06-12 DIAGNOSIS — G4700 Insomnia, unspecified: Secondary | ICD-10-CM | POA: Diagnosis not present

## 2020-06-12 DIAGNOSIS — M5440 Lumbago with sciatica, unspecified side: Secondary | ICD-10-CM | POA: Diagnosis not present

## 2020-06-12 DIAGNOSIS — R112 Nausea with vomiting, unspecified: Secondary | ICD-10-CM | POA: Diagnosis not present

## 2020-06-13 DIAGNOSIS — G894 Chronic pain syndrome: Secondary | ICD-10-CM | POA: Diagnosis not present

## 2020-06-13 DIAGNOSIS — F411 Generalized anxiety disorder: Secondary | ICD-10-CM | POA: Diagnosis not present

## 2020-06-13 DIAGNOSIS — M5440 Lumbago with sciatica, unspecified side: Secondary | ICD-10-CM | POA: Diagnosis not present

## 2020-06-13 DIAGNOSIS — R112 Nausea with vomiting, unspecified: Secondary | ICD-10-CM | POA: Diagnosis not present

## 2020-06-15 DIAGNOSIS — M5440 Lumbago with sciatica, unspecified side: Secondary | ICD-10-CM | POA: Diagnosis not present

## 2020-06-15 DIAGNOSIS — G894 Chronic pain syndrome: Secondary | ICD-10-CM | POA: Diagnosis not present

## 2020-06-15 DIAGNOSIS — S22008D Other fracture of unspecified thoracic vertebra, subsequent encounter for fracture with routine healing: Secondary | ICD-10-CM | POA: Diagnosis not present

## 2020-06-15 DIAGNOSIS — R112 Nausea with vomiting, unspecified: Secondary | ICD-10-CM | POA: Diagnosis not present

## 2020-06-18 DIAGNOSIS — F411 Generalized anxiety disorder: Secondary | ICD-10-CM | POA: Diagnosis not present

## 2020-06-18 DIAGNOSIS — M5440 Lumbago with sciatica, unspecified side: Secondary | ICD-10-CM | POA: Diagnosis not present

## 2020-06-18 DIAGNOSIS — F05 Delirium due to known physiological condition: Secondary | ICD-10-CM | POA: Diagnosis not present

## 2020-06-18 DIAGNOSIS — R112 Nausea with vomiting, unspecified: Secondary | ICD-10-CM | POA: Diagnosis not present

## 2020-06-19 DIAGNOSIS — M6281 Muscle weakness (generalized): Secondary | ICD-10-CM | POA: Diagnosis not present

## 2020-06-19 DIAGNOSIS — I10 Essential (primary) hypertension: Secondary | ICD-10-CM | POA: Diagnosis not present

## 2020-06-19 DIAGNOSIS — E785 Hyperlipidemia, unspecified: Secondary | ICD-10-CM | POA: Diagnosis not present

## 2020-06-19 DIAGNOSIS — R2689 Other abnormalities of gait and mobility: Secondary | ICD-10-CM | POA: Diagnosis not present

## 2020-06-22 DIAGNOSIS — G894 Chronic pain syndrome: Secondary | ICD-10-CM | POA: Diagnosis not present

## 2020-06-22 DIAGNOSIS — E1169 Type 2 diabetes mellitus with other specified complication: Secondary | ICD-10-CM | POA: Diagnosis not present

## 2020-06-22 DIAGNOSIS — I10 Essential (primary) hypertension: Secondary | ICD-10-CM | POA: Diagnosis not present

## 2020-06-22 DIAGNOSIS — Z6832 Body mass index (BMI) 32.0-32.9, adult: Secondary | ICD-10-CM | POA: Diagnosis not present

## 2020-06-22 DIAGNOSIS — E214 Other specified disorders of parathyroid gland: Secondary | ICD-10-CM | POA: Diagnosis not present

## 2020-06-23 DIAGNOSIS — D329 Benign neoplasm of meninges, unspecified: Secondary | ICD-10-CM | POA: Diagnosis not present

## 2020-06-23 DIAGNOSIS — R7303 Prediabetes: Secondary | ICD-10-CM | POA: Diagnosis not present

## 2020-06-23 DIAGNOSIS — S22079D Unspecified fracture of T9-T10 vertebra, subsequent encounter for fracture with routine healing: Secondary | ICD-10-CM | POA: Diagnosis not present

## 2020-06-23 DIAGNOSIS — Z6841 Body Mass Index (BMI) 40.0 and over, adult: Secondary | ICD-10-CM | POA: Diagnosis not present

## 2020-06-23 DIAGNOSIS — S22089D Unspecified fracture of T11-T12 vertebra, subsequent encounter for fracture with routine healing: Secondary | ICD-10-CM | POA: Diagnosis not present

## 2020-06-23 DIAGNOSIS — Z86711 Personal history of pulmonary embolism: Secondary | ICD-10-CM | POA: Diagnosis not present

## 2020-06-23 DIAGNOSIS — I10 Essential (primary) hypertension: Secondary | ICD-10-CM | POA: Diagnosis not present

## 2020-06-23 DIAGNOSIS — E7800 Pure hypercholesterolemia, unspecified: Secondary | ICD-10-CM | POA: Diagnosis not present

## 2020-06-23 DIAGNOSIS — Z96652 Presence of left artificial knee joint: Secondary | ICD-10-CM | POA: Diagnosis not present

## 2020-06-23 DIAGNOSIS — Z9181 History of falling: Secondary | ICD-10-CM | POA: Diagnosis not present

## 2020-06-23 DIAGNOSIS — Z86718 Personal history of other venous thrombosis and embolism: Secondary | ICD-10-CM | POA: Diagnosis not present

## 2020-06-23 DIAGNOSIS — M1990 Unspecified osteoarthritis, unspecified site: Secondary | ICD-10-CM | POA: Diagnosis not present

## 2020-06-23 DIAGNOSIS — C8510 Unspecified B-cell lymphoma, unspecified site: Secondary | ICD-10-CM | POA: Diagnosis not present

## 2020-06-26 DIAGNOSIS — D496 Neoplasm of unspecified behavior of brain: Secondary | ICD-10-CM | POA: Diagnosis not present

## 2020-06-26 DIAGNOSIS — Z9181 History of falling: Secondary | ICD-10-CM | POA: Diagnosis not present

## 2020-06-26 DIAGNOSIS — M1990 Unspecified osteoarthritis, unspecified site: Secondary | ICD-10-CM | POA: Diagnosis not present

## 2020-06-26 DIAGNOSIS — M47812 Spondylosis without myelopathy or radiculopathy, cervical region: Secondary | ICD-10-CM | POA: Diagnosis not present

## 2020-06-26 DIAGNOSIS — S22089D Unspecified fracture of T11-T12 vertebra, subsequent encounter for fracture with routine healing: Secondary | ICD-10-CM | POA: Diagnosis not present

## 2020-06-26 DIAGNOSIS — E7800 Pure hypercholesterolemia, unspecified: Secondary | ICD-10-CM | POA: Diagnosis not present

## 2020-06-26 DIAGNOSIS — C8510 Unspecified B-cell lymphoma, unspecified site: Secondary | ICD-10-CM | POA: Diagnosis not present

## 2020-06-26 DIAGNOSIS — I10 Essential (primary) hypertension: Secondary | ICD-10-CM | POA: Diagnosis not present

## 2020-06-26 DIAGNOSIS — Z6841 Body Mass Index (BMI) 40.0 and over, adult: Secondary | ICD-10-CM | POA: Diagnosis not present

## 2020-06-26 DIAGNOSIS — Z86711 Personal history of pulmonary embolism: Secondary | ICD-10-CM | POA: Diagnosis not present

## 2020-06-26 DIAGNOSIS — C8331 Diffuse large B-cell lymphoma, lymph nodes of head, face, and neck: Secondary | ICD-10-CM | POA: Diagnosis not present

## 2020-06-26 DIAGNOSIS — Z96652 Presence of left artificial knee joint: Secondary | ICD-10-CM | POA: Diagnosis not present

## 2020-06-26 DIAGNOSIS — S22079D Unspecified fracture of T9-T10 vertebra, subsequent encounter for fracture with routine healing: Secondary | ICD-10-CM | POA: Diagnosis not present

## 2020-06-26 DIAGNOSIS — D329 Benign neoplasm of meninges, unspecified: Secondary | ICD-10-CM | POA: Diagnosis not present

## 2020-06-26 DIAGNOSIS — R7303 Prediabetes: Secondary | ICD-10-CM | POA: Diagnosis not present

## 2020-06-26 DIAGNOSIS — Z86718 Personal history of other venous thrombosis and embolism: Secondary | ICD-10-CM | POA: Diagnosis not present

## 2020-06-26 DIAGNOSIS — K118 Other diseases of salivary glands: Secondary | ICD-10-CM | POA: Diagnosis not present

## 2020-06-26 DIAGNOSIS — C8338 Diffuse large B-cell lymphoma, lymph nodes of multiple sites: Secondary | ICD-10-CM | POA: Diagnosis not present

## 2020-06-27 DIAGNOSIS — M25561 Pain in right knee: Secondary | ICD-10-CM | POA: Diagnosis not present

## 2020-06-27 DIAGNOSIS — G894 Chronic pain syndrome: Secondary | ICD-10-CM | POA: Diagnosis not present

## 2020-06-27 DIAGNOSIS — M25562 Pain in left knee: Secondary | ICD-10-CM | POA: Diagnosis not present

## 2020-06-27 DIAGNOSIS — S32000S Wedge compression fracture of unspecified lumbar vertebra, sequela: Secondary | ICD-10-CM | POA: Diagnosis not present

## 2020-06-28 DIAGNOSIS — D509 Iron deficiency anemia, unspecified: Secondary | ICD-10-CM | POA: Diagnosis not present

## 2020-06-28 DIAGNOSIS — C8330 Diffuse large B-cell lymphoma, unspecified site: Secondary | ICD-10-CM | POA: Diagnosis not present

## 2020-06-28 DIAGNOSIS — Z5111 Encounter for antineoplastic chemotherapy: Secondary | ICD-10-CM | POA: Diagnosis not present

## 2020-06-28 DIAGNOSIS — M545 Low back pain: Secondary | ICD-10-CM | POA: Diagnosis not present

## 2020-06-28 DIAGNOSIS — E538 Deficiency of other specified B group vitamins: Secondary | ICD-10-CM | POA: Diagnosis not present

## 2020-06-29 DIAGNOSIS — Z86718 Personal history of other venous thrombosis and embolism: Secondary | ICD-10-CM | POA: Diagnosis not present

## 2020-06-29 DIAGNOSIS — Z86711 Personal history of pulmonary embolism: Secondary | ICD-10-CM | POA: Diagnosis not present

## 2020-06-29 DIAGNOSIS — E7800 Pure hypercholesterolemia, unspecified: Secondary | ICD-10-CM | POA: Diagnosis not present

## 2020-06-29 DIAGNOSIS — I10 Essential (primary) hypertension: Secondary | ICD-10-CM | POA: Diagnosis not present

## 2020-06-29 DIAGNOSIS — Z96652 Presence of left artificial knee joint: Secondary | ICD-10-CM | POA: Diagnosis not present

## 2020-06-29 DIAGNOSIS — Z6841 Body Mass Index (BMI) 40.0 and over, adult: Secondary | ICD-10-CM | POA: Diagnosis not present

## 2020-06-29 DIAGNOSIS — R7303 Prediabetes: Secondary | ICD-10-CM | POA: Diagnosis not present

## 2020-06-29 DIAGNOSIS — M1990 Unspecified osteoarthritis, unspecified site: Secondary | ICD-10-CM | POA: Diagnosis not present

## 2020-06-29 DIAGNOSIS — S22079D Unspecified fracture of T9-T10 vertebra, subsequent encounter for fracture with routine healing: Secondary | ICD-10-CM | POA: Diagnosis not present

## 2020-06-29 DIAGNOSIS — S22089D Unspecified fracture of T11-T12 vertebra, subsequent encounter for fracture with routine healing: Secondary | ICD-10-CM | POA: Diagnosis not present

## 2020-06-29 DIAGNOSIS — Z9181 History of falling: Secondary | ICD-10-CM | POA: Diagnosis not present

## 2020-06-29 DIAGNOSIS — D329 Benign neoplasm of meninges, unspecified: Secondary | ICD-10-CM | POA: Diagnosis not present

## 2020-06-29 DIAGNOSIS — C8510 Unspecified B-cell lymphoma, unspecified site: Secondary | ICD-10-CM | POA: Diagnosis not present

## 2020-07-02 DIAGNOSIS — S22079D Unspecified fracture of T9-T10 vertebra, subsequent encounter for fracture with routine healing: Secondary | ICD-10-CM | POA: Diagnosis not present

## 2020-07-02 DIAGNOSIS — S22089D Unspecified fracture of T11-T12 vertebra, subsequent encounter for fracture with routine healing: Secondary | ICD-10-CM | POA: Diagnosis not present

## 2020-07-02 DIAGNOSIS — Z96652 Presence of left artificial knee joint: Secondary | ICD-10-CM | POA: Diagnosis not present

## 2020-07-02 DIAGNOSIS — M1990 Unspecified osteoarthritis, unspecified site: Secondary | ICD-10-CM | POA: Diagnosis not present

## 2020-07-02 DIAGNOSIS — Z86718 Personal history of other venous thrombosis and embolism: Secondary | ICD-10-CM | POA: Diagnosis not present

## 2020-07-02 DIAGNOSIS — S22088D Other fracture of T11-T12 vertebra, subsequent encounter for fracture with routine healing: Secondary | ICD-10-CM | POA: Diagnosis not present

## 2020-07-02 DIAGNOSIS — D329 Benign neoplasm of meninges, unspecified: Secondary | ICD-10-CM | POA: Diagnosis not present

## 2020-07-02 DIAGNOSIS — Z86711 Personal history of pulmonary embolism: Secondary | ICD-10-CM | POA: Diagnosis not present

## 2020-07-02 DIAGNOSIS — R7303 Prediabetes: Secondary | ICD-10-CM | POA: Diagnosis not present

## 2020-07-02 DIAGNOSIS — C8510 Unspecified B-cell lymphoma, unspecified site: Secondary | ICD-10-CM | POA: Diagnosis not present

## 2020-07-02 DIAGNOSIS — Z9181 History of falling: Secondary | ICD-10-CM | POA: Diagnosis not present

## 2020-07-02 DIAGNOSIS — I10 Essential (primary) hypertension: Secondary | ICD-10-CM | POA: Diagnosis not present

## 2020-07-02 DIAGNOSIS — Z6841 Body Mass Index (BMI) 40.0 and over, adult: Secondary | ICD-10-CM | POA: Diagnosis not present

## 2020-07-02 DIAGNOSIS — M6281 Muscle weakness (generalized): Secondary | ICD-10-CM | POA: Diagnosis not present

## 2020-07-02 DIAGNOSIS — E7800 Pure hypercholesterolemia, unspecified: Secondary | ICD-10-CM | POA: Diagnosis not present

## 2020-07-05 DIAGNOSIS — E7800 Pure hypercholesterolemia, unspecified: Secondary | ICD-10-CM | POA: Diagnosis not present

## 2020-07-05 DIAGNOSIS — Z6841 Body Mass Index (BMI) 40.0 and over, adult: Secondary | ICD-10-CM | POA: Diagnosis not present

## 2020-07-05 DIAGNOSIS — Z96652 Presence of left artificial knee joint: Secondary | ICD-10-CM | POA: Diagnosis not present

## 2020-07-05 DIAGNOSIS — M1990 Unspecified osteoarthritis, unspecified site: Secondary | ICD-10-CM | POA: Diagnosis not present

## 2020-07-05 DIAGNOSIS — Z86711 Personal history of pulmonary embolism: Secondary | ICD-10-CM | POA: Diagnosis not present

## 2020-07-05 DIAGNOSIS — S22089D Unspecified fracture of T11-T12 vertebra, subsequent encounter for fracture with routine healing: Secondary | ICD-10-CM | POA: Diagnosis not present

## 2020-07-05 DIAGNOSIS — R7303 Prediabetes: Secondary | ICD-10-CM | POA: Diagnosis not present

## 2020-07-05 DIAGNOSIS — I10 Essential (primary) hypertension: Secondary | ICD-10-CM | POA: Diagnosis not present

## 2020-07-05 DIAGNOSIS — S22079D Unspecified fracture of T9-T10 vertebra, subsequent encounter for fracture with routine healing: Secondary | ICD-10-CM | POA: Diagnosis not present

## 2020-07-05 DIAGNOSIS — D329 Benign neoplasm of meninges, unspecified: Secondary | ICD-10-CM | POA: Diagnosis not present

## 2020-07-05 DIAGNOSIS — Z9181 History of falling: Secondary | ICD-10-CM | POA: Diagnosis not present

## 2020-07-05 DIAGNOSIS — Z86718 Personal history of other venous thrombosis and embolism: Secondary | ICD-10-CM | POA: Diagnosis not present

## 2020-07-05 DIAGNOSIS — C8510 Unspecified B-cell lymphoma, unspecified site: Secondary | ICD-10-CM | POA: Diagnosis not present

## 2020-07-09 DIAGNOSIS — Z86718 Personal history of other venous thrombosis and embolism: Secondary | ICD-10-CM | POA: Diagnosis not present

## 2020-07-09 DIAGNOSIS — M1990 Unspecified osteoarthritis, unspecified site: Secondary | ICD-10-CM | POA: Diagnosis not present

## 2020-07-09 DIAGNOSIS — Z86711 Personal history of pulmonary embolism: Secondary | ICD-10-CM | POA: Diagnosis not present

## 2020-07-09 DIAGNOSIS — D329 Benign neoplasm of meninges, unspecified: Secondary | ICD-10-CM | POA: Diagnosis not present

## 2020-07-09 DIAGNOSIS — S22089D Unspecified fracture of T11-T12 vertebra, subsequent encounter for fracture with routine healing: Secondary | ICD-10-CM | POA: Diagnosis not present

## 2020-07-09 DIAGNOSIS — Z6841 Body Mass Index (BMI) 40.0 and over, adult: Secondary | ICD-10-CM | POA: Diagnosis not present

## 2020-07-09 DIAGNOSIS — C8510 Unspecified B-cell lymphoma, unspecified site: Secondary | ICD-10-CM | POA: Diagnosis not present

## 2020-07-09 DIAGNOSIS — E7800 Pure hypercholesterolemia, unspecified: Secondary | ICD-10-CM | POA: Diagnosis not present

## 2020-07-09 DIAGNOSIS — Z96652 Presence of left artificial knee joint: Secondary | ICD-10-CM | POA: Diagnosis not present

## 2020-07-09 DIAGNOSIS — S22079D Unspecified fracture of T9-T10 vertebra, subsequent encounter for fracture with routine healing: Secondary | ICD-10-CM | POA: Diagnosis not present

## 2020-07-09 DIAGNOSIS — I10 Essential (primary) hypertension: Secondary | ICD-10-CM | POA: Diagnosis not present

## 2020-07-09 DIAGNOSIS — Z9181 History of falling: Secondary | ICD-10-CM | POA: Diagnosis not present

## 2020-07-09 DIAGNOSIS — R7303 Prediabetes: Secondary | ICD-10-CM | POA: Diagnosis not present

## 2020-07-10 DIAGNOSIS — M1990 Unspecified osteoarthritis, unspecified site: Secondary | ICD-10-CM | POA: Diagnosis not present

## 2020-07-10 DIAGNOSIS — R7303 Prediabetes: Secondary | ICD-10-CM | POA: Diagnosis not present

## 2020-07-10 DIAGNOSIS — Z9181 History of falling: Secondary | ICD-10-CM | POA: Diagnosis not present

## 2020-07-10 DIAGNOSIS — S22089D Unspecified fracture of T11-T12 vertebra, subsequent encounter for fracture with routine healing: Secondary | ICD-10-CM | POA: Diagnosis not present

## 2020-07-10 DIAGNOSIS — Z96652 Presence of left artificial knee joint: Secondary | ICD-10-CM | POA: Diagnosis not present

## 2020-07-10 DIAGNOSIS — E7800 Pure hypercholesterolemia, unspecified: Secondary | ICD-10-CM | POA: Diagnosis not present

## 2020-07-10 DIAGNOSIS — C8510 Unspecified B-cell lymphoma, unspecified site: Secondary | ICD-10-CM | POA: Diagnosis not present

## 2020-07-10 DIAGNOSIS — Z86711 Personal history of pulmonary embolism: Secondary | ICD-10-CM | POA: Diagnosis not present

## 2020-07-10 DIAGNOSIS — S22079D Unspecified fracture of T9-T10 vertebra, subsequent encounter for fracture with routine healing: Secondary | ICD-10-CM | POA: Diagnosis not present

## 2020-07-10 DIAGNOSIS — D329 Benign neoplasm of meninges, unspecified: Secondary | ICD-10-CM | POA: Diagnosis not present

## 2020-07-10 DIAGNOSIS — I10 Essential (primary) hypertension: Secondary | ICD-10-CM | POA: Diagnosis not present

## 2020-07-10 DIAGNOSIS — Z6841 Body Mass Index (BMI) 40.0 and over, adult: Secondary | ICD-10-CM | POA: Diagnosis not present

## 2020-07-10 DIAGNOSIS — Z86718 Personal history of other venous thrombosis and embolism: Secondary | ICD-10-CM | POA: Diagnosis not present

## 2020-07-12 DIAGNOSIS — D329 Benign neoplasm of meninges, unspecified: Secondary | ICD-10-CM | POA: Diagnosis not present

## 2020-07-12 DIAGNOSIS — S22089D Unspecified fracture of T11-T12 vertebra, subsequent encounter for fracture with routine healing: Secondary | ICD-10-CM | POA: Diagnosis not present

## 2020-07-12 DIAGNOSIS — E7800 Pure hypercholesterolemia, unspecified: Secondary | ICD-10-CM | POA: Diagnosis not present

## 2020-07-12 DIAGNOSIS — Z96652 Presence of left artificial knee joint: Secondary | ICD-10-CM | POA: Diagnosis not present

## 2020-07-12 DIAGNOSIS — Z86711 Personal history of pulmonary embolism: Secondary | ICD-10-CM | POA: Diagnosis not present

## 2020-07-12 DIAGNOSIS — Z6841 Body Mass Index (BMI) 40.0 and over, adult: Secondary | ICD-10-CM | POA: Diagnosis not present

## 2020-07-12 DIAGNOSIS — Z86718 Personal history of other venous thrombosis and embolism: Secondary | ICD-10-CM | POA: Diagnosis not present

## 2020-07-12 DIAGNOSIS — M1990 Unspecified osteoarthritis, unspecified site: Secondary | ICD-10-CM | POA: Diagnosis not present

## 2020-07-12 DIAGNOSIS — R7303 Prediabetes: Secondary | ICD-10-CM | POA: Diagnosis not present

## 2020-07-12 DIAGNOSIS — Z9181 History of falling: Secondary | ICD-10-CM | POA: Diagnosis not present

## 2020-07-12 DIAGNOSIS — I10 Essential (primary) hypertension: Secondary | ICD-10-CM | POA: Diagnosis not present

## 2020-07-12 DIAGNOSIS — S22079D Unspecified fracture of T9-T10 vertebra, subsequent encounter for fracture with routine healing: Secondary | ICD-10-CM | POA: Diagnosis not present

## 2020-07-12 DIAGNOSIS — C8510 Unspecified B-cell lymphoma, unspecified site: Secondary | ICD-10-CM | POA: Diagnosis not present

## 2020-07-17 DIAGNOSIS — S22089D Unspecified fracture of T11-T12 vertebra, subsequent encounter for fracture with routine healing: Secondary | ICD-10-CM | POA: Diagnosis not present

## 2020-07-17 DIAGNOSIS — I10 Essential (primary) hypertension: Secondary | ICD-10-CM | POA: Diagnosis not present

## 2020-07-17 DIAGNOSIS — C8510 Unspecified B-cell lymphoma, unspecified site: Secondary | ICD-10-CM | POA: Diagnosis not present

## 2020-07-17 DIAGNOSIS — M1990 Unspecified osteoarthritis, unspecified site: Secondary | ICD-10-CM | POA: Diagnosis not present

## 2020-07-17 DIAGNOSIS — Z96652 Presence of left artificial knee joint: Secondary | ICD-10-CM | POA: Diagnosis not present

## 2020-07-17 DIAGNOSIS — R7303 Prediabetes: Secondary | ICD-10-CM | POA: Diagnosis not present

## 2020-07-17 DIAGNOSIS — Z86711 Personal history of pulmonary embolism: Secondary | ICD-10-CM | POA: Diagnosis not present

## 2020-07-17 DIAGNOSIS — D329 Benign neoplasm of meninges, unspecified: Secondary | ICD-10-CM | POA: Diagnosis not present

## 2020-07-17 DIAGNOSIS — Z6841 Body Mass Index (BMI) 40.0 and over, adult: Secondary | ICD-10-CM | POA: Diagnosis not present

## 2020-07-17 DIAGNOSIS — Z86718 Personal history of other venous thrombosis and embolism: Secondary | ICD-10-CM | POA: Diagnosis not present

## 2020-07-17 DIAGNOSIS — Z9181 History of falling: Secondary | ICD-10-CM | POA: Diagnosis not present

## 2020-07-17 DIAGNOSIS — S22079D Unspecified fracture of T9-T10 vertebra, subsequent encounter for fracture with routine healing: Secondary | ICD-10-CM | POA: Diagnosis not present

## 2020-07-17 DIAGNOSIS — E7800 Pure hypercholesterolemia, unspecified: Secondary | ICD-10-CM | POA: Diagnosis not present

## 2020-07-18 DIAGNOSIS — D329 Benign neoplasm of meninges, unspecified: Secondary | ICD-10-CM | POA: Diagnosis not present

## 2020-07-18 DIAGNOSIS — C8510 Unspecified B-cell lymphoma, unspecified site: Secondary | ICD-10-CM | POA: Diagnosis not present

## 2020-07-18 DIAGNOSIS — M1990 Unspecified osteoarthritis, unspecified site: Secondary | ICD-10-CM | POA: Diagnosis not present

## 2020-07-18 DIAGNOSIS — R7303 Prediabetes: Secondary | ICD-10-CM | POA: Diagnosis not present

## 2020-07-18 DIAGNOSIS — Z86718 Personal history of other venous thrombosis and embolism: Secondary | ICD-10-CM | POA: Diagnosis not present

## 2020-07-18 DIAGNOSIS — S22079D Unspecified fracture of T9-T10 vertebra, subsequent encounter for fracture with routine healing: Secondary | ICD-10-CM | POA: Diagnosis not present

## 2020-07-18 DIAGNOSIS — Z6841 Body Mass Index (BMI) 40.0 and over, adult: Secondary | ICD-10-CM | POA: Diagnosis not present

## 2020-07-18 DIAGNOSIS — E7800 Pure hypercholesterolemia, unspecified: Secondary | ICD-10-CM | POA: Diagnosis not present

## 2020-07-18 DIAGNOSIS — Z96652 Presence of left artificial knee joint: Secondary | ICD-10-CM | POA: Diagnosis not present

## 2020-07-18 DIAGNOSIS — I10 Essential (primary) hypertension: Secondary | ICD-10-CM | POA: Diagnosis not present

## 2020-07-18 DIAGNOSIS — S22089D Unspecified fracture of T11-T12 vertebra, subsequent encounter for fracture with routine healing: Secondary | ICD-10-CM | POA: Diagnosis not present

## 2020-07-18 DIAGNOSIS — Z9181 History of falling: Secondary | ICD-10-CM | POA: Diagnosis not present

## 2020-07-18 DIAGNOSIS — Z86711 Personal history of pulmonary embolism: Secondary | ICD-10-CM | POA: Diagnosis not present

## 2020-07-19 ENCOUNTER — Inpatient Hospital Stay (HOSPITAL_COMMUNITY): Payer: PPO

## 2020-07-19 ENCOUNTER — Emergency Department (HOSPITAL_COMMUNITY): Payer: PPO

## 2020-07-19 ENCOUNTER — Other Ambulatory Visit: Payer: Self-pay

## 2020-07-19 ENCOUNTER — Encounter (HOSPITAL_COMMUNITY): Payer: Self-pay

## 2020-07-19 ENCOUNTER — Inpatient Hospital Stay (HOSPITAL_COMMUNITY)
Admission: EM | Admit: 2020-07-19 | Discharge: 2020-07-25 | DRG: 175 | Disposition: A | Discharge status: Skilled Nursing Facility | Payer: PPO | Attending: Internal Medicine | Admitting: Internal Medicine

## 2020-07-19 ENCOUNTER — Inpatient Hospital Stay (HOSPITAL_COMMUNITY): Admit: 2020-07-19 | Payer: PPO

## 2020-07-19 DIAGNOSIS — Y92012 Bathroom of single-family (private) house as the place of occurrence of the external cause: Secondary | ICD-10-CM | POA: Diagnosis not present

## 2020-07-19 DIAGNOSIS — E860 Dehydration: Secondary | ICD-10-CM | POA: Diagnosis not present

## 2020-07-19 DIAGNOSIS — R609 Edema, unspecified: Secondary | ICD-10-CM | POA: Diagnosis not present

## 2020-07-19 DIAGNOSIS — I959 Hypotension, unspecified: Secondary | ICD-10-CM | POA: Diagnosis present

## 2020-07-19 DIAGNOSIS — R7303 Prediabetes: Secondary | ICD-10-CM | POA: Diagnosis not present

## 2020-07-19 DIAGNOSIS — D696 Thrombocytopenia, unspecified: Secondary | ICD-10-CM

## 2020-07-19 DIAGNOSIS — I82401 Acute embolism and thrombosis of unspecified deep veins of right lower extremity: Secondary | ICD-10-CM | POA: Diagnosis present

## 2020-07-19 DIAGNOSIS — G5793 Unspecified mononeuropathy of bilateral lower limbs: Secondary | ICD-10-CM | POA: Diagnosis not present

## 2020-07-19 DIAGNOSIS — R778 Other specified abnormalities of plasma proteins: Secondary | ICD-10-CM | POA: Diagnosis not present

## 2020-07-19 DIAGNOSIS — N939 Abnormal uterine and vaginal bleeding, unspecified: Secondary | ICD-10-CM | POA: Diagnosis present

## 2020-07-19 DIAGNOSIS — Z888 Allergy status to other drugs, medicaments and biological substances status: Secondary | ICD-10-CM | POA: Diagnosis not present

## 2020-07-19 DIAGNOSIS — I82432 Acute embolism and thrombosis of left popliteal vein: Secondary | ICD-10-CM | POA: Diagnosis present

## 2020-07-19 DIAGNOSIS — I10 Essential (primary) hypertension: Secondary | ICD-10-CM | POA: Diagnosis not present

## 2020-07-19 DIAGNOSIS — I82412 Acute embolism and thrombosis of left femoral vein: Secondary | ICD-10-CM | POA: Diagnosis not present

## 2020-07-19 DIAGNOSIS — R590 Localized enlarged lymph nodes: Secondary | ICD-10-CM | POA: Diagnosis not present

## 2020-07-19 DIAGNOSIS — R42 Dizziness and giddiness: Secondary | ICD-10-CM | POA: Diagnosis not present

## 2020-07-19 DIAGNOSIS — N809 Endometriosis, unspecified: Secondary | ICD-10-CM | POA: Diagnosis not present

## 2020-07-19 DIAGNOSIS — I480 Paroxysmal atrial fibrillation: Secondary | ICD-10-CM | POA: Diagnosis present

## 2020-07-19 DIAGNOSIS — M6281 Muscle weakness (generalized): Secondary | ICD-10-CM | POA: Diagnosis not present

## 2020-07-19 DIAGNOSIS — M2550 Pain in unspecified joint: Secondary | ICD-10-CM | POA: Diagnosis not present

## 2020-07-19 DIAGNOSIS — T474X5A Adverse effect of other laxatives, initial encounter: Secondary | ICD-10-CM | POA: Diagnosis present

## 2020-07-19 DIAGNOSIS — I4892 Unspecified atrial flutter: Secondary | ICD-10-CM | POA: Diagnosis not present

## 2020-07-19 DIAGNOSIS — D4989 Neoplasm of unspecified behavior of other specified sites: Secondary | ICD-10-CM | POA: Diagnosis not present

## 2020-07-19 DIAGNOSIS — M159 Polyosteoarthritis, unspecified: Secondary | ICD-10-CM | POA: Diagnosis not present

## 2020-07-19 DIAGNOSIS — E1169 Type 2 diabetes mellitus with other specified complication: Secondary | ICD-10-CM | POA: Diagnosis not present

## 2020-07-19 DIAGNOSIS — I82531 Chronic embolism and thrombosis of right popliteal vein: Secondary | ICD-10-CM | POA: Diagnosis present

## 2020-07-19 DIAGNOSIS — W19XXXA Unspecified fall, initial encounter: Secondary | ICD-10-CM | POA: Diagnosis present

## 2020-07-19 DIAGNOSIS — M7989 Other specified soft tissue disorders: Secondary | ICD-10-CM

## 2020-07-19 DIAGNOSIS — Z882 Allergy status to sulfonamides status: Secondary | ICD-10-CM | POA: Diagnosis not present

## 2020-07-19 DIAGNOSIS — I272 Pulmonary hypertension, unspecified: Secondary | ICD-10-CM | POA: Diagnosis present

## 2020-07-19 DIAGNOSIS — E785 Hyperlipidemia, unspecified: Secondary | ICD-10-CM | POA: Diagnosis not present

## 2020-07-19 DIAGNOSIS — Z20822 Contact with and (suspected) exposure to covid-19: Secondary | ICD-10-CM | POA: Diagnosis present

## 2020-07-19 DIAGNOSIS — M25562 Pain in left knee: Secondary | ICD-10-CM | POA: Diagnosis present

## 2020-07-19 DIAGNOSIS — K5909 Other constipation: Secondary | ICD-10-CM | POA: Diagnosis present

## 2020-07-19 DIAGNOSIS — Z800 Family history of malignant neoplasm of digestive organs: Secondary | ICD-10-CM

## 2020-07-19 DIAGNOSIS — Z043 Encounter for examination and observation following other accident: Secondary | ICD-10-CM | POA: Diagnosis not present

## 2020-07-19 DIAGNOSIS — D630 Anemia in neoplastic disease: Secondary | ICD-10-CM | POA: Diagnosis present

## 2020-07-19 DIAGNOSIS — R200 Anesthesia of skin: Secondary | ICD-10-CM | POA: Diagnosis present

## 2020-07-19 DIAGNOSIS — D6959 Other secondary thrombocytopenia: Secondary | ICD-10-CM | POA: Diagnosis present

## 2020-07-19 DIAGNOSIS — M623 Immobility syndrome (paraplegic): Secondary | ICD-10-CM | POA: Diagnosis present

## 2020-07-19 DIAGNOSIS — I82411 Acute embolism and thrombosis of right femoral vein: Secondary | ICD-10-CM | POA: Diagnosis not present

## 2020-07-19 DIAGNOSIS — G8929 Other chronic pain: Secondary | ICD-10-CM | POA: Diagnosis not present

## 2020-07-19 DIAGNOSIS — D638 Anemia in other chronic diseases classified elsewhere: Secondary | ICD-10-CM | POA: Diagnosis not present

## 2020-07-19 DIAGNOSIS — E669 Obesity, unspecified: Secondary | ICD-10-CM | POA: Diagnosis not present

## 2020-07-19 DIAGNOSIS — I2699 Other pulmonary embolism without acute cor pulmonale: Secondary | ICD-10-CM | POA: Diagnosis not present

## 2020-07-19 DIAGNOSIS — Z9221 Personal history of antineoplastic chemotherapy: Secondary | ICD-10-CM

## 2020-07-19 DIAGNOSIS — I4891 Unspecified atrial fibrillation: Secondary | ICD-10-CM

## 2020-07-19 DIAGNOSIS — Z7901 Long term (current) use of anticoagulants: Secondary | ICD-10-CM | POA: Diagnosis not present

## 2020-07-19 DIAGNOSIS — S22089G Unspecified fracture of T11-T12 vertebra, subsequent encounter for fracture with delayed healing: Secondary | ICD-10-CM

## 2020-07-19 DIAGNOSIS — I2602 Saddle embolus of pulmonary artery with acute cor pulmonale: Secondary | ICD-10-CM | POA: Diagnosis not present

## 2020-07-19 DIAGNOSIS — M170 Bilateral primary osteoarthritis of knee: Secondary | ICD-10-CM | POA: Diagnosis not present

## 2020-07-19 DIAGNOSIS — S22088D Other fracture of T11-T12 vertebra, subsequent encounter for fracture with routine healing: Secondary | ICD-10-CM | POA: Diagnosis not present

## 2020-07-19 DIAGNOSIS — R0902 Hypoxemia: Secondary | ICD-10-CM | POA: Diagnosis not present

## 2020-07-19 DIAGNOSIS — I361 Nonrheumatic tricuspid (valve) insufficiency: Secondary | ICD-10-CM | POA: Diagnosis not present

## 2020-07-19 DIAGNOSIS — R7989 Other specified abnormal findings of blood chemistry: Secondary | ICD-10-CM | POA: Diagnosis not present

## 2020-07-19 DIAGNOSIS — Z79899 Other long term (current) drug therapy: Secondary | ICD-10-CM

## 2020-07-19 DIAGNOSIS — Z86711 Personal history of pulmonary embolism: Secondary | ICD-10-CM | POA: Diagnosis present

## 2020-07-19 DIAGNOSIS — R531 Weakness: Secondary | ICD-10-CM | POA: Diagnosis not present

## 2020-07-19 DIAGNOSIS — R197 Diarrhea, unspecified: Secondary | ICD-10-CM | POA: Diagnosis not present

## 2020-07-19 DIAGNOSIS — C8330 Diffuse large B-cell lymphoma, unspecified site: Secondary | ICD-10-CM | POA: Diagnosis not present

## 2020-07-19 DIAGNOSIS — Z6834 Body mass index (BMI) 34.0-34.9, adult: Secondary | ICD-10-CM | POA: Diagnosis not present

## 2020-07-19 DIAGNOSIS — R000 Tachycardia, unspecified: Secondary | ICD-10-CM | POA: Diagnosis not present

## 2020-07-19 DIAGNOSIS — S22080A Wedge compression fracture of T11-T12 vertebra, initial encounter for closed fracture: Secondary | ICD-10-CM | POA: Diagnosis not present

## 2020-07-19 DIAGNOSIS — E7800 Pure hypercholesterolemia, unspecified: Secondary | ICD-10-CM | POA: Diagnosis present

## 2020-07-19 DIAGNOSIS — I2692 Saddle embolus of pulmonary artery without acute cor pulmonale: Secondary | ICD-10-CM | POA: Diagnosis not present

## 2020-07-19 DIAGNOSIS — I517 Cardiomegaly: Secondary | ICD-10-CM | POA: Diagnosis not present

## 2020-07-19 DIAGNOSIS — Z7401 Bed confinement status: Secondary | ICD-10-CM | POA: Diagnosis not present

## 2020-07-19 DIAGNOSIS — M1990 Unspecified osteoarthritis, unspecified site: Secondary | ICD-10-CM | POA: Diagnosis present

## 2020-07-19 HISTORY — DX: Other pulmonary embolism without acute cor pulmonale: I26.99

## 2020-07-19 LAB — CBC WITH DIFFERENTIAL/PLATELET
Abs Immature Granulocytes: 0.11 10*3/uL — ABNORMAL HIGH (ref 0.00–0.07)
Basophils Absolute: 0.1 10*3/uL (ref 0.0–0.1)
Basophils Relative: 1 %
Eosinophils Absolute: 0 10*3/uL (ref 0.0–0.5)
Eosinophils Relative: 0 %
HCT: 33.8 % — ABNORMAL LOW (ref 36.0–46.0)
Hemoglobin: 10.7 g/dL — ABNORMAL LOW (ref 12.0–15.0)
Immature Granulocytes: 1 %
Lymphocytes Relative: 16 %
Lymphs Abs: 1.6 10*3/uL (ref 0.7–4.0)
MCH: 31.8 pg (ref 26.0–34.0)
MCHC: 31.7 g/dL (ref 30.0–36.0)
MCV: 100.6 fL — ABNORMAL HIGH (ref 80.0–100.0)
Monocytes Absolute: 1.1 10*3/uL — ABNORMAL HIGH (ref 0.1–1.0)
Monocytes Relative: 11 %
Neutro Abs: 7.4 10*3/uL (ref 1.7–7.7)
Neutrophils Relative %: 71 %
Platelets: 73 10*3/uL — ABNORMAL LOW (ref 150–400)
RBC: 3.36 MIL/uL — ABNORMAL LOW (ref 3.87–5.11)
RDW: 16.9 % — ABNORMAL HIGH (ref 11.5–15.5)
WBC: 10.4 10*3/uL (ref 4.0–10.5)
nRBC: 0 % (ref 0.0–0.2)

## 2020-07-19 LAB — COMPREHENSIVE METABOLIC PANEL
ALT: 15 U/L (ref 0–44)
AST: 24 U/L (ref 15–41)
Albumin: 3 g/dL — ABNORMAL LOW (ref 3.5–5.0)
Alkaline Phosphatase: 86 U/L (ref 38–126)
Anion gap: 11 (ref 5–15)
BUN: 14 mg/dL (ref 8–23)
CO2: 21 mmol/L — ABNORMAL LOW (ref 22–32)
Calcium: 9.1 mg/dL (ref 8.9–10.3)
Chloride: 109 mmol/L (ref 98–111)
Creatinine, Ser: 0.83 mg/dL (ref 0.44–1.00)
GFR calc Af Amer: >60 mL/min (ref >60)
GFR calc non Af Amer: >60 mL/min (ref >60)
Glucose, Bld: 137 mg/dL — ABNORMAL HIGH (ref 70–99)
Potassium: 4.1 mmol/L (ref 3.5–5.1)
Sodium: 141 mmol/L (ref 135–145)
Total Bilirubin: 1.1 mg/dL (ref 0.3–1.2)
Total Protein: 5.7 g/dL — ABNORMAL LOW (ref 6.5–8.1)

## 2020-07-19 LAB — TROPONIN I (HIGH SENSITIVITY)
Troponin I (High Sensitivity): 215 ng/L (ref <18)
Troponin I (High Sensitivity): 218 ng/L (ref <18)
Troponin I (High Sensitivity): 173 ng/L (ref <18)

## 2020-07-19 LAB — HIV ANTIBODY (ROUTINE TESTING W REFLEX): HIV Screen 4th Generation wRfx: NONREACTIVE

## 2020-07-19 LAB — LACTIC ACID, PLASMA
Lactic Acid, Venous: 1.6 mmol/L (ref 0.5–1.9)
Lactic Acid, Venous: 3 mmol/L (ref 0.5–1.9)

## 2020-07-19 LAB — ECHOCARDIOGRAM COMPLETE
AV Mean grad: 5.9 mmHg
AV Peak grad: 14 mmHg
Ao pk vel: 1.87 m/s
S' Lateral: 2.92 cm

## 2020-07-19 LAB — BRAIN NATRIURETIC PEPTIDE: B Natriuretic Peptide: 1464.4 pg/mL — ABNORMAL HIGH (ref 0.0–100.0)

## 2020-07-19 LAB — SARS CORONAVIRUS 2 BY RT PCR (HOSPITAL ORDER, PERFORMED IN ~~LOC~~ HOSPITAL LAB): SARS Coronavirus 2: NEGATIVE

## 2020-07-19 LAB — LIPASE, BLOOD: Lipase: 18 U/L (ref 11–51)

## 2020-07-19 MED ORDER — PANTOPRAZOLE SODIUM 40 MG PO TBEC
40.0000 mg | DELAYED_RELEASE_TABLET | Freq: Every day | ORAL | Status: DC
  Administered 2020-07-19 – 2020-07-25 (×7): 40 mg via ORAL
  Filled 2020-07-19 (×7): qty 1

## 2020-07-19 MED ORDER — MORPHINE SULFATE (PF) 4 MG/ML IV SOLN
4.0000 mg | Freq: Once | INTRAVENOUS | Status: AC
  Administered 2020-07-19: 4 mg via INTRAVENOUS
  Filled 2020-07-19: qty 1

## 2020-07-19 MED ORDER — SODIUM CHLORIDE 0.9 % IV BOLUS
500.0000 mL | Freq: Once | INTRAVENOUS | Status: AC
  Administered 2020-07-19: 500 mL via INTRAVENOUS

## 2020-07-19 MED ORDER — SODIUM CHLORIDE (PF) 0.9 % IJ SOLN
INTRAMUSCULAR | Status: AC
  Filled 2020-07-19: qty 50

## 2020-07-19 MED ORDER — ATORVASTATIN CALCIUM 10 MG PO TABS
10.0000 mg | ORAL_TABLET | Freq: Every evening | ORAL | Status: DC
  Administered 2020-07-19 – 2020-07-24 (×6): 10 mg via ORAL
  Filled 2020-07-19 (×7): qty 1

## 2020-07-19 MED ORDER — PROCHLORPERAZINE MALEATE 10 MG PO TABS
5.0000 mg | ORAL_TABLET | Freq: Two times a day (BID) | ORAL | Status: DC
  Administered 2020-07-19 – 2020-07-25 (×11): 5 mg via ORAL
  Filled 2020-07-19 (×11): qty 1

## 2020-07-19 MED ORDER — LACTATED RINGERS IV SOLN: INTRAVENOUS | Status: DC

## 2020-07-19 MED ORDER — HEPARIN (PORCINE) 25000 UT/250ML-% IV SOLN
1350.0000 [IU]/h | INTRAVENOUS | Status: DC
  Administered 2020-07-19: 1350 [IU]/h via INTRAVENOUS
  Filled 2020-07-19: qty 250

## 2020-07-19 MED ORDER — SUCRALFATE 1 GM/10ML PO SUSP
1.0000 g | Freq: Three times a day (TID) | ORAL | Status: DC
  Administered 2020-07-19 – 2020-07-25 (×17): 1 g via ORAL
  Filled 2020-07-19 (×17): qty 10

## 2020-07-19 MED ORDER — ONDANSETRON HCL 4 MG PO TABS
4.0000 mg | ORAL_TABLET | Freq: Two times a day (BID) | ORAL | Status: DC
  Administered 2020-07-19 – 2020-07-25 (×12): 4 mg via ORAL
  Filled 2020-07-19 (×12): qty 1

## 2020-07-19 MED ORDER — POTASSIUM CHLORIDE ER 10 MEQ PO TBCR
10.0000 meq | EXTENDED_RELEASE_TABLET | Freq: Two times a day (BID) | ORAL | Status: DC
  Administered 2020-07-19 – 2020-07-25 (×12): 10 meq via ORAL
  Filled 2020-07-19 (×24): qty 1

## 2020-07-19 MED ORDER — DOCUSATE SODIUM 100 MG PO CAPS
100.0000 mg | ORAL_CAPSULE | Freq: Two times a day (BID) | ORAL | Status: DC | PRN
  Administered 2020-07-21: 100 mg via ORAL
  Filled 2020-07-19: qty 1

## 2020-07-19 MED ORDER — HEPARIN BOLUS VIA INFUSION
6000.0000 [IU] | Freq: Once | INTRAVENOUS | Status: AC
  Administered 2020-07-19: 6000 [IU] via INTRAVENOUS
  Filled 2020-07-19: qty 6000

## 2020-07-19 MED ORDER — OXYCODONE-ACETAMINOPHEN 7.5-325 MG PO TABS
1.0000 | ORAL_TABLET | Freq: Four times a day (QID) | ORAL | Status: DC
  Administered 2020-07-19 – 2020-07-25 (×23): 1 via ORAL
  Filled 2020-07-19 (×22): qty 1

## 2020-07-19 MED ORDER — POLYETHYLENE GLYCOL 3350 17 G PO PACK: 17.0000 g | PACK | Freq: Every day | ORAL | Status: DC | PRN

## 2020-07-19 MED ORDER — IOHEXOL 350 MG/ML SOLN
80.0000 mL | Freq: Once | INTRAVENOUS | Status: AC | PRN
  Administered 2020-07-19: 80 mL via INTRAVENOUS

## 2020-07-19 MED ORDER — GABAPENTIN 300 MG PO CAPS
300.0000 mg | ORAL_CAPSULE | Freq: Three times a day (TID) | ORAL | Status: DC
  Administered 2020-07-19 – 2020-07-25 (×18): 300 mg via ORAL
  Filled 2020-07-19 (×18): qty 1
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
## 2020-07-19 NOTE — ED Notes (Addendum)
Pt reports she was walking through the house on way to take her medications and fell. Pt son and neighbors came to help her get off the floor. Pt c/o left knee pains.  Pt report taking prune juice, milk of mag, and metamucil last night due to thinking she was constipated bc she hadnt had a BM in 2 days. Pt feels she should have a BM everyday.

## 2020-07-19 NOTE — ED Notes (Signed)
CC- NP at pt bedside

## 2020-07-19 NOTE — Progress Notes (Signed)
  Echocardiogram 2D Echocardiogram has been performed.  Contact on call cardiologist to read STAT echo after 5pm.  Johny Chess 07/19/2020, 6:27 PM

## 2020-07-19 NOTE — ED Triage Notes (Signed)
Pt presents via EMS with c/o possible dehydration. Pt is a current cancer pt, receiving treatment. Pt took a large amount of laxatives because she believed she was constipated and now pt is having diarrhea and experiencing some possible dehydration.

## 2020-07-19 NOTE — ED Notes (Signed)
ED Provider at bedside. 

## 2020-07-19 NOTE — ED Notes (Signed)
multiple attempts to collect blood specimen, unsuccessful. Will consult with phlebotomy

## 2020-07-19 NOTE — Progress Notes (Signed)
ANTICOAGULATION CONSULT NOTE - Initial Consult  Pharmacy Consult for IV heparin Indication: pulmonary embolus  Allergies  Allergen Reactions  . Tylenol [Acetaminophen] Other (See Comments)    States "seems like eats my stomach"  . Sulfa Antibiotics Other (See Comments)    Unknown allergic reaction    Patient Measurements:   Heparin Dosing Weight: 75 kg (using 05/24/20 weight)  Vital Signs: Temp: 97.5 F (36.4 C) (08/26 1112) Temp Source: Oral (08/26 1112) BP: 112/73 (08/26 1323) Pulse Rate: 106 (08/26 1323)  Labs: Recent Labs    07/19/20 1202  HGB 10.7*  HCT 33.8*  PLT 73*  CREATININE 0.83  TROPONINIHS 215*    CrCl cannot be calculated (Unknown ideal weight.).   Medical History: Past Medical History:  Diagnosis Date  . Arthritis   . Edema    B/LLE  . Endometriosis   . High cholesterol   . Hypertension   . Numbness    feet  . Pre-diabetes   . Walker as ambulation aid   . Wears dentures   . Wears glasses     Medications:  (Not in a hospital admission)  Scheduled:  . heparin  6,000 Units Intravenous Once  .  morphine injection  4 mg Intravenous Once  . sodium chloride (PF)       Infusions:  . heparin      Assessment: 56 yoF with DLCBL currently undergoing [facility] at [facility], presents with weakness/fall. Initially worked up for dehydration, but CTA reveals massive saddle PE with extensive RH strain (RV:LV ratio > 3). Pharmacy to start heparin infusion.   Of note, PMH significant for PE in March 2017, treated with Xarelto through Oct 2018. Also had hx endometrial bleeding which resolved with hormonal treatment.   Baseline INR, aPTT: not done  Prior anticoagulation: none, stopped Xarelto in 2018  Significant events:  Today, 07/19/2020:  CBC: Hgb and Plt both low d/t chemo; per chart review patient's last plt nadir was ~70k and recovered within 14 days  SCr at baseline  No bleeding or infusion issues per nursing  Goal of Therapy: Heparin  level 0.3-0.7 units/ml Monitor platelets by anticoagulation protocol: Yes  Plan:  Heparin 6000 units IV bolus x 1  Heparin 1350 units/hr IV infusion  Check heparin level 8 hrs after start  Daily CBC, daily heparin level once stable  Monitor for signs of bleeding or thrombosis  Reuel Boom, PharmD, BCPS [phone] 07/19/2020, 1:56 PM

## 2020-07-19 NOTE — ED Notes (Signed)
Provider and Echo tech at bedside

## 2020-07-19 NOTE — ED Provider Notes (Signed)
[facility] DEPT Provider Note   CSN: [id number] Arrival date & time: 07/19/20  0930     History Chief Complaint  Patient presents with  . Diarrhea  . Fall  . Knee Pain    left    Lauren Santana is a 69 y.o. female presenting for evaluation of diarrhea and lightheadedness.  Patient states she was not having a bowel movement, so she took suppositories, milk of magnesia, and prune juice last night.  This morning, she has been having frequent bowel movements, although she is often incontinent of bowel and wears a pull-up, so does not know how any dominant she has had.  She states today she felt weak, causing her to fall.  She fell onto her bottom, did not hit her head or lose consciousness.  She is not on blood thinners.  She was unable to stand up, EMS was called to help.  Patient's son helps EMS get into the house, patient was found to be mildly hypotensive.  However she was able to tolerate p.o., it was recommended she hydrate PO.  After EMS left, son reports patient had an episode of confusion and shaking, as such patient was brought to the hospital.  Patient denies fevers, chills, cough, shortness of breath, nausea, vomiting, urinary symptoms.  She does report mild abdominal and side cramping, states this is chronic.  She has swelling of bilateral lower extremities, states this is also chronic and unchanged today.  She is currently being treated for lymphoma, she is on her 5th out of 6 treatment.   HPI     Past Medical History:  Diagnosis Date  . Arthritis   . Edema    B/LLE  . Endometriosis   . High cholesterol   . Hypertension   . Numbness    feet  . Pre-diabetes   . Pulmonary embolism (HCC)    recurrent, second episode in 06/2020  . Walker as ambulation aid   . Wears dentures   . Wears glasses     Patient Active Problem List   Diagnosis Date Noted  . 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 S/P BSO (bilateral  salpingo-oophorectomy) 01/08/2017  . S/P hysterectomy 01/08/2017  . Tachycardia   . Acute deep vein thrombosis (DVT) of right lower extremity ([place]) 02/14/2016  . Pulmonary emboli ([place]) 02/13/2016  . PE (pulmonary embolism) 02/13/2016  . AKI (acute kidney injury) ([place]) 02/13/2016  . Pulmonary embolism with acute cor pulmonale ([place])   . Pelvic mass in female   . Morbid obesity with BMI of 40.0-44.9, adult ([place]) 08/20/2015  . 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 Essential hypertension, benign 07/23/2014  . Edema 07/23/2014  . Dyslipidemia 07/23/2014  . Lower extremity numbness 07/23/2014  . 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 Postmenopausal bleeding 03/10/2012  . Endometriosis of pelvis 01/22/2007    Past Surgical History:  Procedure Laterality Date  . ABDOMINAL HYSTERECTOMY     and BSO  . CATARACT EXTRACTION Left 10/2018  . [place]  . COLONOSCOPY    . EYE SURGERY Bilateral    CATARACT SX  . HERNIA REPAIR  2001   incisional  . JOINT REPLACEMENT Left    Knee  . MAXILLARY ANTROSTOMY Right 01/20/2020   Procedure: MAXILLARY ANTROSTOMY  WITH BIOPSY CALDWELL APPROACH;  Surgeon: Jerrell Belfast, MD;  Location: [facility];  Service: ENT;  Laterality: Right;  . MULTIPLE TOOTH EXTRACTIONS  2011  . NASAL ENDOSCOPY Right  02/17/2020   Procedure: NASAL ENDOSCOPY WITH RIGHT INFERIOR TURNBINATE REDUCTION;  Surgeon: Jerrell Belfast, MD;  Location: [facility];  Service: ENT;  Laterality: Right;  . neck mass removal     "fatty tissue, not cancerous" per pt's son  . SINUS ENDO WITH FUSION Right 01/20/2020   Procedure: SINUS ENDO WITH FUSION WITH BIOSPY;  Surgeon: Jerrell Belfast, MD;  Location: [facility];  Service: ENT;  Laterality: Right;  . TOTAL KNEE ARTHROPLASTY Left 07/17/2014   Procedure: TOTAL KNEE ARTHROPLASTY;  Surgeon: Kerin Salen, MD;  Location: [facility];  Service: Orthopedics;  Laterality: Left;     OB History    Gravida  4   Para  3   Term  3   Preterm      AB   1   Living  2     SAB  1   TAB      Ectopic      Multiple      Live Births              Family History  Problem Relation Age of Onset  . Colon cancer Sister   . Cancer Sister        unsure of type of cancer    Social History   Tobacco Use  . Smoking status: Never Smoker  . Smokeless tobacco: Never Used  Vaping Use  . Vaping Use: Never used  Substance Use Topics  . Alcohol use: No  . Drug use: No    Home Medications Prior to Admission medications   Medication Sig Start Date End Date Taking? Authorizing Provider  atorvastatin (LIPITOR) 10 MG tablet TAKE ONE TABLET BY MOUTH  ONCE DAILY IN THE EVENING Patient taking differently: Take 10 mg by mouth every evening.  03/18/16  Yes Gildardo Cranker, DO  diclofenac Sodium (VOLTAREN) 1 % GEL Apply 2 g topically 4 (four) times daily as needed (pain).   Yes [provider]  furosemide (LASIX) 20 MG tablet Take 1 tablet (20 mg total) by mouth daily. 02/16/16  Yes Bonnielee Haff, MD  gabapentin (NEURONTIN) 300 MG capsule Take 300 mg by mouth 3 (three) times daily.   Yes [provider]  labetalol (NORMODYNE) 100 MG tablet Take 100 mg by mouth 2 (two) times daily.   Yes [provider]  medroxyPROGESTERone (PROVERA) 10 MG tablet Take 10 mg by mouth every evening.  01/06/20  Yes [provider]  ondansetron (ZOFRAN) 4 MG tablet Take 4 mg by mouth 2 (two) times daily.  04/26/20  Yes [provider]  oxyCODONE-acetaminophen (PERCOCET) 7.5-325 MG tablet Take 1 tablet by mouth 4 (four) times daily.  07/02/20  Yes [provider]  potassium chloride (KLOR-CON) 10 MEQ tablet Take 10 mEq by mouth 2 (two) times daily. 06/20/20  Yes [provider]  prochlorperazine (COMPAZINE) 5 MG tablet Take 5 mg by mouth 2 (two) times daily.  04/26/20  Yes [provider]  sucralfate (CARAFATE) 1 GM/10ML suspension Take 1 g by mouth 3 (three) times daily before meals. 01/06/20  Yes [provider]  Lidocaine 4 % PTCH Apply 1 patch topically 2 (two) times daily. Patient not taking: Reported on 05/28/2020 05/22/20   Varney Biles, MD  oxyCODONE-acetaminophen (PERCOCET/ROXICET) 5-325 MG tablet Take 1 tablet by mouth every 8 (eight) hours as needed for severe pain. Patient not taking: Reported on 05/28/2020 05/22/20   Varney Biles, MD    Allergies    Tylenol [acetaminophen] and Sulfa antibiotics  Review of Systems   Review of Systems  Gastrointestinal: Positive for diarrhea.  Allergic/Immunologic: Positive for immunocompromised state.  Neurological: Positive for weakness.  All other systems reviewed and are negative.   Physical Exam Updated Vital Signs BP 109/66   Pulse (!) 108   Temp (!) 97.5 F (36.4 C) (Oral)   Resp (!) 21   LMP  (LMP Unknown)   SpO2 98%   Physical Exam Vitals and nursing note reviewed.  Constitutional:      General: She is not in acute distress.    Appearance: She is well-developed.  HENT:     Head: Normocephalic and atraumatic.  Eyes:     Extraocular Movements: Extraocular movements intact.     Conjunctiva/sclera: Conjunctivae normal.     Pupils: Pupils are equal, round, and reactive to light.  Cardiovascular:     Rate and Rhythm: Regular rhythm. Tachycardia present.     Pulses: Normal pulses.     Comments: Tachycardic around 115 Pulmonary:     Effort: Pulmonary effort is normal. No respiratory distress.     Breath sounds: Normal breath sounds. No wheezing.     Comments: Mild ttp of the R sided chest wall. Speaking in full sentences. Clear lung sounds in all fields Abdominal:     General: There is no distension.     Palpations: Abdomen is soft. There is no mass.     Tenderness: There is no abdominal tenderness. There is no guarding or rebound.  Musculoskeletal:        General: Normal range of motion.     Cervical back: Normal range of motion and neck supple.     Comments: Bilateral leg swelling without pitting edema. Pedal  pulses 2+ bilaterally Abrasion of the L knee, RN reports pt had pain with transfer of the L knee.   Skin:    General: Skin is warm and dry.     Capillary Refill: Capillary refill takes less than 2 seconds.  Neurological:     Mental Status: She is alert and oriented to person, place, and time.     ED Results / Procedures / Treatments   Labs (all labs ordered are listed, but only abnormal results are displayed) Labs Reviewed  COMPREHENSIVE METABOLIC PANEL - Abnormal; Notable for the following components:      Result Value   CO2 21 (*)    Glucose, Bld 137 (*)    Total Protein 5.7 (*)    Albumin 3.0 (*)    All other components within normal limits  CBC WITH DIFFERENTIAL/PLATELET - Abnormal; Notable for the following components:   RBC 3.36 (*)    Hemoglobin 10.7 (*)    HCT 33.8 (*)    MCV 100.6 (*)    RDW 16.9 (*)    Platelets 73 (*)    Monocytes Absolute 1.1 (*)    Abs Immature Granulocytes 0.11 (*)    All other components within normal limits  TROPONIN I (HIGH SENSITIVITY) - Abnormal; Notable for the following components:   Troponin I (High Sensitivity) 215 (*)    All other components within normal limits  TROPONIN I (HIGH SENSITIVITY) - Abnormal; Notable for the following components:   Troponin I (High Sensitivity) 218 (*)    All other components within normal limits  SARS CORONAVIRUS 2 BY RT PCR (HOSPITAL ORDER, [facility])  LIPASE, BLOOD  URINALYSIS, ROUTINE W REFLEX MICROSCOPIC  HEPARIN LEVEL (UNFRACTIONATED)    EKG EKG Interpretation  Date/Time:  Thursday  July 19 2020 11:53:49 EDT Ventricular Rate:  107 PR Interval:    QRS Duration: 98 QT Interval:  374 QTC Calculation: 499 R Axis:   144 Text Interpretation: Sinus tachycardia Right axis deviation Nonspecific T abnormalities, diffuse leads Borderline prolonged QT interval Confirmed by Dewaine Conger [phone]) on 07/19/2020 12:16:49 PM   Radiology CT Angio Chest PE W and/or Wo  Contrast  Result Date: 07/19/2020 CLINICAL DATA:  Chest pain.  Question pulmonary embolism. EXAM: CT ANGIOGRAPHY CHEST WITH CONTRAST TECHNIQUE: Multidetector CT imaging of the chest was performed using the standard protocol during bolus administration of intravenous contrast. Multiplanar CT image reconstructions and MIPs were obtained to evaluate the vascular anatomy. CONTRAST:  46mL OMNIPAQUE IOHEXOL 350 MG/ML SOLN COMPARISON:  None. FINDINGS: Cardiovascular: Pulmonary arterial opacification is excellent. There is large volume pulmonary embolism affecting both lungs, including a saddle embolus. Diminished perfusion particularly of the left lower lobe. Overall heart size is normal. Right ventricular strain with a ratio of over 3. No aortic atherosclerotic calcification. Mediastinum/Nodes: No mass or adenopathy. Lungs/Pleura: Lungs are clear presently.  No effusions. Upper Abdomen: Negative Musculoskeletal: Curvature and chronic degenerative change. Recent or unhealed compression fracture at T12. This was present on 05/22/2020 but has shown some further collapse. Review of the MIP images confirms the above findings. IMPRESSION: 1. Large volume pulmonary embolism affecting both lungs, including a saddle embolus. Right ventricular strain with a ratio of over 3. 2. Recent or unhealed compression fracture at T12. This was present on 05/22/2020 but has shown some further collapse. 3. Critical Value/emergent results were called by telephone at the time of interpretation on 07/19/2020 at 1:28 pm to provider [facility] , who verbally acknowledged these results. Electronically Signed   By: Nelson Chimes M.D.   On: 07/19/2020 13:33   DG Knee Complete 4 Views Left  Result Date: 07/19/2020 CLINICAL DATA:  69 year old female status post fall at home today. Pain and swelling. EXAM: LEFT KNEE - COMPLETE 4+ VIEW COMPARISON:  Left knee series 07/20/2014. FINDINGS: Chronic left total knee arthroplasty. No evidence of joint  effusion on the cross-table lateral view. Hardware appears stable and normally aligned. Chronic bony overgrowth at the lateral proximal tibia and fibula which might be ankylosed. No acute osseous abnormality identified. No discrete soft tissue injury. IMPRESSION: Chronic left total knee arthroplasty. No acute fracture or dislocation identified. Electronically Signed   By: Genevie Ann M.D.   On: 07/19/2020 12:51    Procedures .Critical Care Performed by: Franchot Heidelberg, PA-C Authorized by: Franchot Heidelberg, PA-C   Critical care provider statement:    Critical care time (minutes):  45   Critical care time was exclusive of:  Separately billable procedures and treating other patients and teaching time   Critical care was necessary to treat or prevent imminent or life-threatening deterioration of the following conditions:  Respiratory failure   Critical care was time spent personally by me on the following activities:  Blood draw for specimens, development of treatment plan with patient or surrogate, evaluation of patient's response to treatment, examination of patient, obtaining history from patient or surrogate, ordering and performing treatments and interventions, ordering and review of laboratory studies, ordering and review of radiographic studies, pulse oximetry, re-evaluation of patient's condition and review of old charts   I assumed direction of critical care for this patient from another provider in my specialty: no   Comments:     Pt with massive PE causing heart strain and elevated trop. Started on heparin gtt and  PCCM consulted. Pt to be admitted.    (including critical care time)  Medications Ordered in ED Medications  sodium chloride (PF) 0.9 % injection (has no administration in time range)  heparin ADULT infusion 100 units/mL (25000 units/273mL sodium chloride 0.45%) (1,350 Units/hr Intravenous New Bag/Given 07/19/20 1421)  sodium chloride 0.9 % bolus 500 mL (0 mLs Intravenous  Stopped 07/19/20 1300)  iohexol (OMNIPAQUE) 350 MG/ML injection 80 mL (80 mLs Intravenous Contrast Given 07/19/20 1308)  morphine 4 MG/ML injection 4 mg (4 mg Intravenous Given 07/19/20 1415)  heparin bolus via infusion 6,000 Units (6,000 Units Intravenous Bolus from Bag 07/19/20 1421)    ED Course  I have reviewed the triage vital signs and the nursing notes.  Pertinent labs & imaging results that were available during my care of the patient were reviewed by me and considered in my medical decision making (see chart for details).    MDM Rules/Calculators/A&P                          Patient presenting for evaluation of weakness, cough, diarrhea.  On exam, patient is tachycardic.  She reports an episode of hypotension in the house, although blood pressure stable in the ED.  Consider hypokalemia due to diarrhea from laxatives taken last night.  However patient also currently being treated for cancer and has a history of PE, not on thinners.  Consider PE.  Consider infectious cause, though less likely without fever or infectious symptoms.  Consider electrolyte abnormalities.  Will obtain labs, EKG, and CT of the chest.  X-ray of the left knee ordered due to signs of trauma.  Small fluid bolus given for presumed dehydration. Case discussed with attending, Dr. Ron Parker evaluated the pt.   Labs show elevated troponin at 215.  EKG without signs of ischemia, patient without chest pain.  As such, concern for PE over ACS.  Labs otherwise stable.  Heart rate improving slightly with fluids.  X-ray of the knee negative for acute findings including fracture dislocation.  CTA shows massive saddle PE causing significant heart strain, ratio greater than 3.  Will start heparin and consult with critical care.  Critical care to evaluate the pt.   Final Clinical Impression(s) / ED Diagnoses Final diagnoses:  Acute saddle pulmonary embolism with acute cor pulmonale (HCC)  Elevated troponin  Diarrhea, unspecified type     Rx / DC Orders ED Discharge Orders    None       Franchot Heidelberg, PA-C 07/19/20 1514    Breck Coons, MD 07/20/20 [phone]

## 2020-07-19 NOTE — Consult Note (Signed)
Chief Complaint: Patient was seen in consultation today for saddle PE.  Referring Physician(s): Donita Brooks  Supervising Physician: Daryll Brod  Patient Status: [facility] - ED  History of Present Illness: Lauren Santana is a 69 y.o. female with a past medical history of hypertension, high cholesterol, prior PE 2017, pre-diabetes, large B-cell lymphoma currently undergoing therapy, arthritis, and peripheral neuropathy. She presented to [facility] ED via EMS this AM with complaints of diarrhea. Per chart, patient was constipated, took milk of magnesia and prune juice last evening, and this AM began to have frequent bowel movements. States she felt weak trying to go to the bathroom and fell on her buttocks (she did not lose consciousness). In ED, patient tachycardic and normotensive, sating in high 90s on RA. Troponin's elevated to 215, repeat elevated to 218. Due to history of PE and lightheadedness, CTA chest was obtained which revealed a large volume pulmonary embolism. She was admitted for further management and was started on Heparin gtt. CCM was consulted who recommended IR consult for possible catheter directed thrombolysis if indicated.  CTA chest 07/19/2020: 1. Large volume pulmonary embolism affecting both lungs, including a saddle embolus. Right ventricular strain with a ratio of over 3. 2. Recent or unhealed compression fracture at T12. This was present on 05/22/2020 but has shown some further collapse.  IR consulted by Noe Gens, NP for management of saddle PE. Patient awake and alert laying in bed undergoing DVT US study with no complaints at this time. Denies fever, chills, chest pain, dyspnea, abdominal pain, or headache.  Currently on Heparin gtt.   Past Medical History:  Diagnosis Date  . Arthritis   . Edema    B/LLE  . Endometriosis   . High cholesterol   . Hypertension   . Numbness    feet  . Pre-diabetes   . Pulmonary embolism (HCC)    recurrent, second  episode in 06/2020  . Walker as ambulation aid   . Wears dentures   . Wears glasses     Past Surgical History:  Procedure Laterality Date  . ABDOMINAL HYSTERECTOMY     and BSO  . CATARACT EXTRACTION Left 10/2018  . [place]  . COLONOSCOPY    . EYE SURGERY Bilateral    CATARACT SX  . HERNIA REPAIR  2001   incisional  . JOINT REPLACEMENT Left    Knee  . MAXILLARY ANTROSTOMY Right 01/20/2020   Procedure: MAXILLARY ANTROSTOMY  WITH BIOPSY CALDWELL APPROACH;  Surgeon: Jerrell Belfast, MD;  Location: [place];  Service: ENT;  Laterality: Right;  . MULTIPLE TOOTH EXTRACTIONS  2011  . NASAL ENDOSCOPY Right 02/17/2020   Procedure: NASAL ENDOSCOPY WITH RIGHT INFERIOR TURNBINATE REDUCTION;  Surgeon: Jerrell Belfast, MD;  Location: [place];  Service: ENT;  Laterality: Right;  . neck mass removal     "fatty tissue, not cancerous" per pt's son  . SINUS ENDO WITH FUSION Right 01/20/2020   Procedure: SINUS ENDO WITH FUSION WITH BIOSPY;  Surgeon: Jerrell Belfast, MD;  Location: [place];  Service: ENT;  Laterality: Right;  . TOTAL KNEE ARTHROPLASTY Left 07/17/2014   Procedure: TOTAL KNEE ARTHROPLASTY;  Surgeon: Kerin Salen, MD;  Location: [place];  Service: Orthopedics;  Laterality: Left;    Allergies: Tylenol [acetaminophen] and Sulfa antibiotics  Medications: Prior to Admission medications   Medication Sig Start Date End Date Taking? Authorizing Provider  atorvastatin (LIPITOR) 10 MG tablet TAKE ONE TABLET BY MOUTH  ONCE DAILY IN  THE EVENING Patient taking differently: Take 10 mg by mouth every evening.  03/18/16  Yes Gildardo Cranker, DO  diclofenac Sodium (VOLTAREN) 1 % GEL Apply 2 g topically 4 (four) times daily as needed (pain).   Yes [provider]  furosemide (LASIX) 20 MG tablet Take 1 tablet (20 mg total) by mouth daily. 02/16/16  Yes Bonnielee Haff, MD  gabapentin (NEURONTIN) 300 MG capsule Take 300 mg by mouth 3 (three) times daily.   Yes [provider]  labetalol (NORMODYNE) 100 MG tablet Take 100 mg by mouth 2 (two) times daily.   Yes [provider]  medroxyPROGESTERone (PROVERA) 10 MG tablet Take 10 mg by mouth every evening.  01/06/20  Yes [provider]  ondansetron (ZOFRAN) 4 MG tablet Take 4 mg by mouth 2 (two) times daily.  04/26/20  Yes [provider]  oxyCODONE-acetaminophen (PERCOCET) 7.5-325 MG tablet Take 1 tablet by mouth 4 (four) times daily.  07/02/20  Yes [provider]  potassium chloride (KLOR-CON) 10 MEQ tablet Take 10 mEq by mouth 2 (two) times daily. 06/20/20  Yes [provider]  prochlorperazine (COMPAZINE) 5 MG tablet Take 5 mg by mouth 2 (two) times daily.  04/26/20  Yes [provider]  sucralfate (CARAFATE) 1 GM/10ML suspension Take 1 g by mouth 3 (three) times daily before meals. 01/06/20  Yes [provider]  Lidocaine 4 % PTCH Apply 1 patch topically 2 (two) times daily. Patient not taking: Reported on 05/28/2020 05/22/20   Varney Biles, MD  oxyCODONE-acetaminophen (PERCOCET/ROXICET) 5-325 MG tablet Take 1 tablet by mouth every 8 (eight) hours as needed for severe pain. Patient not taking: Reported on 05/28/2020 05/22/20   Varney Biles, MD     Family History  Problem Relation Age of Onset  . Colon cancer Sister   . Cancer Sister        unsure of type of cancer    Social History   Socioeconomic History  . Marital status: Divorced    Spouse name: Not on file  . Number of children: 2  . Years of education: Not on file  . Highest education level: High school graduate  Occupational History  . Not on file  Tobacco Use  . Smoking status: Never Smoker  . Smokeless tobacco: Never Used  Vaping Use  . Vaping Use: Never used  Substance and Sexual Activity  . Alcohol use: No  . Drug use: No  . Sexual activity: Not Currently    Comment: Hysterectoomy  Other Topics Concern  . Not on file  Social History Narrative   Lives alone    Right handed    Social Determinants of Health   Financial Resource Strain:   . Difficulty of Paying Living Expenses: Not on file  Food Insecurity:   . Worried About Charity fundraiser in the Last Year: Not on file  . Ran Out of Food in the Last Year: Not on file  Transportation Needs:   . Lack of Transportation (Medical): Not on file  . Lack of Transportation (Non-Medical): Not on file  Physical Activity:   . Days of Exercise per Week: Not on file  . Minutes of Exercise per Session: Not on file  Stress:   . Feeling of Stress : Not on file  Social Connections:   . Frequency of Communication with Friends and Family: Not on file  . Frequency of Social Gatherings with Friends and Family: Not on file  . Attends Religious Services:  Not on file  . Active Member of Clubs or Organizations: Not on file  . Attends Archivist Meetings: Not on file  . Marital Status: Not on file     Review of Systems: A 12 point ROS discussed and pertinent positives are indicated in the HPI above.  All other systems are negative.  Review of Systems  Constitutional: Negative for chills and fever.  Respiratory: Negative for shortness of breath and wheezing.   Cardiovascular: Negative for chest pain and palpitations.  Gastrointestinal: Negative for abdominal pain.  Neurological: Negative for headaches.  Psychiatric/Behavioral: Negative for behavioral problems and confusion.    Vital Signs: BP 114/72   Pulse (!) 102   Temp (!) 97.5 F (36.4 C) (Oral)   Resp (!) 24   LMP  (LMP Unknown)   SpO2 100%   Physical Exam Vitals and nursing note reviewed.  Constitutional:      General: She is not in acute distress. Cardiovascular:     Rate and Rhythm: Regular rhythm. Tachycardia present.     Heart sounds: Normal heart sounds. No murmur heard.   Pulmonary:     Effort: Pulmonary effort is normal. No respiratory distress.     Breath sounds: Normal breath sounds. No wheezing.  Skin:    General: Skin is warm  and dry.  Neurological:     Mental Status: She is alert and oriented to person, place, and time.      MD Evaluation Airway: WNL Heart: WNL Abdomen: WNL Chest/ Lungs: WNL ASA  Classification: 3 Mallampati/Airway Score: Two   Imaging: CT Angio Chest PE W and/or Wo Contrast  Result Date: 07/19/2020 CLINICAL DATA:  Chest pain.  Question pulmonary embolism. EXAM: CT ANGIOGRAPHY CHEST WITH CONTRAST TECHNIQUE: Multidetector CT imaging of the chest was performed using the standard protocol during bolus administration of intravenous contrast. Multiplanar CT image reconstructions and MIPs were obtained to evaluate the vascular anatomy. CONTRAST:  61mL OMNIPAQUE IOHEXOL 350 MG/ML SOLN COMPARISON:  None. FINDINGS: Cardiovascular: Pulmonary arterial opacification is excellent. There is large volume pulmonary embolism affecting both lungs, including a saddle embolus. Diminished perfusion particularly of the left lower lobe. Overall heart size is normal. Right ventricular strain with a ratio of over 3. No aortic atherosclerotic calcification. Mediastinum/Nodes: No mass or adenopathy. Lungs/Pleura: Lungs are clear presently.  No effusions. Upper Abdomen: Negative Musculoskeletal: Curvature and chronic degenerative change. Recent or unhealed compression fracture at T12. This was present on 05/22/2020 but has shown some further collapse. Review of the MIP images confirms the above findings. IMPRESSION: 1. Large volume pulmonary embolism affecting both lungs, including a saddle embolus. Right ventricular strain with a ratio of over 3. 2. Recent or unhealed compression fracture at T12. This was present on 05/22/2020 but has shown some further collapse. 3. Critical Value/emergent results were called by telephone at the time of interpretation on 07/19/2020 at 1:28 pm to provider [facility] , who verbally acknowledged these results. Electronically Signed   By: Nelson Chimes M.D.   On: 07/19/2020 13:33   DG Knee  Complete 4 Views Left  Result Date: 07/19/2020 CLINICAL DATA:  69 year old female status post fall at home today. Pain and swelling. EXAM: LEFT KNEE - COMPLETE 4+ VIEW COMPARISON:  Left knee series 07/20/2014. FINDINGS: Chronic left total knee arthroplasty. No evidence of joint effusion on the cross-table lateral view. Hardware appears stable and normally aligned. Chronic bony overgrowth at the lateral proximal tibia and fibula which might be ankylosed. No acute osseous abnormality identified.  No discrete soft tissue injury. IMPRESSION: Chronic left total knee arthroplasty. No acute fracture or dislocation identified. Electronically Signed   By: Genevie Ann M.D.   On: 07/19/2020 12:51    Labs:  CBC: Recent Labs    02/15/20 1347 05/22/20 2035 05/28/20 1756 07/19/20 1202  WBC 5.4 20.6* 13.9* 10.4  HGB 11.5* 9.6* 10.8* 10.7*  HCT 37.1 30.0* 34.6* 33.8*  PLT 132* 81* 114* 73*    COAGS: No results for input(s): INR, APTT in the last 8760 hours.  BMP: Recent Labs    02/15/20 1347 05/22/20 1612 05/28/20 1756 07/19/20 1202  NA 142 145 143 141  K 3.2* 3.5 3.3* 4.1  CL 107 109 106 109  CO2 26 27 28  21*  GLUCOSE 85 95 92 137*  BUN 5* 13 7* 14  CALCIUM 8.9 8.6* 8.7* 9.1  CREATININE 0.74 0.65 0.78 0.83  GFRNONAA >60 >60 >60 >60  GFRAA >60 >60 >60 >60    LIVER FUNCTION TESTS: Recent Labs    05/28/20 1756 07/19/20 1202  BILITOT 0.6 1.1  AST 19 24  ALT 17 15  ALKPHOS 113 86  PROT 6.0* 5.7*  ALBUMIN 3.2* 3.0*     Assessment and Plan:  Large volume pulmonary embolism effecting both lungs (including a saddle embolus), currently stable on Heparin gtt. Discussed PE with patient. Explained management options moving forward- conservative management with Heparin gtt (what she is currently on) versus PE lysis or thrombectomy. Explained procedures, including risks and benefits. Patient currently clinically stable- she does have some mild tachycardia, however is normotensive and sating in  high 90s on RA. She does not complain of dyspnea or chest pain. Echocardiogram pending. Discussed case with Dr. Annamaria Boots and Dr. Earleen Newport. Given patient's stability, recommend continued with conservative management at this time (Heparin gtt). If patient is to clinically deteriorate, could consider PE lysis versus thrombectomy. Awaiting echocardiogram results- IR to follow in AM. Further plans per CCM- appreciate and agree with management.  Risks and benefits discussed with the patient including, but not limited to bleeding, possible life threatening bleeding and need for blood product transfusion, vascular injury, stroke, contrast induced renal failure, limb loss and infection.   Thank you for this interesting consult.  I greatly enjoyed meeting Jerilyn Gillaspie [facility] and look forward to participating in their care.  A copy of this report was sent to the requesting provider on this date.  Electronically Signed: Earley Abide, PA-C 07/19/2020, 4:52 PM   I spent a total of 40 Minutes in face to face in clinical consultation, greater than 50% of which was counseling/coordinating care for saddle PE.

## 2020-07-19 NOTE — Progress Notes (Signed)
Bilateral lower extremity venous duplex has been completed. Preliminary results can be found in CV Proc through chart review.  Results were given to [facility]. Attempted to call Noe Gens NP with results, however, she was no longer on service when paged.  07/19/20 5:29 PM Lauren Santana RVT

## 2020-07-20 ENCOUNTER — Encounter (HOSPITAL_COMMUNITY): Payer: Self-pay | Admitting: Internal Medicine

## 2020-07-20 ENCOUNTER — Inpatient Hospital Stay (HOSPITAL_COMMUNITY): Payer: PPO

## 2020-07-20 LAB — CBC
HCT: 32.5 % — ABNORMAL LOW (ref 36.0–46.0)
Hemoglobin: 10.1 g/dL — ABNORMAL LOW (ref 12.0–15.0)
MCH: 31.3 pg (ref 26.0–34.0)
MCHC: 31.1 g/dL (ref 30.0–36.0)
MCV: 100.6 fL — ABNORMAL HIGH (ref 80.0–100.0)
Platelets: 85 10*3/uL — ABNORMAL LOW (ref 150–400)
RBC: 3.23 MIL/uL — ABNORMAL LOW (ref 3.87–5.11)
RDW: 17.2 % — ABNORMAL HIGH (ref 11.5–15.5)
WBC: 8.7 10*3/uL (ref 4.0–10.5)
nRBC: 0.3 % — ABNORMAL HIGH (ref 0.0–0.2)

## 2020-07-20 LAB — BASIC METABOLIC PANEL
Anion gap: 9 (ref 5–15)
BUN: 12 mg/dL (ref 8–23)
CO2: 20 mmol/L — ABNORMAL LOW (ref 22–32)
Calcium: 8.6 mg/dL — ABNORMAL LOW (ref 8.9–10.3)
Chloride: 109 mmol/L (ref 98–111)
Creatinine, Ser: 0.69 mg/dL (ref 0.44–1.00)
GFR calc Af Amer: >60 mL/min (ref >60)
GFR calc non Af Amer: >60 mL/min (ref >60)
Glucose, Bld: 105 mg/dL — ABNORMAL HIGH (ref 70–99)
Potassium: 4.2 mmol/L (ref 3.5–5.1)
Sodium: 138 mmol/L (ref 135–145)

## 2020-07-20 LAB — MAGNESIUM: Magnesium: 2 mg/dL (ref 1.7–2.4)

## 2020-07-20 LAB — PHOSPHORUS: Phosphorus: 3.5 mg/dL (ref 2.5–4.6)

## 2020-07-20 LAB — HEPARIN LEVEL (UNFRACTIONATED)
Heparin Unfractionated: 1.14 IU/mL — ABNORMAL HIGH (ref 0.30–0.70)
Heparin Unfractionated: 0.77 IU/mL — ABNORMAL HIGH (ref 0.30–0.70)

## 2020-07-20 LAB — URINALYSIS, ROUTINE W REFLEX MICROSCOPIC
Bilirubin Urine: NEGATIVE
Glucose, UA: NEGATIVE mg/dL
Hgb urine dipstick: NEGATIVE
Ketones, ur: NEGATIVE mg/dL
Leukocytes,Ua: NEGATIVE
Nitrite: NEGATIVE
Protein, ur: NEGATIVE mg/dL
Specific Gravity, Urine: <1.005 — ABNORMAL LOW (ref 1.005–1.030)
pH: 6 (ref 5.0–8.0)

## 2020-07-20 LAB — LACTIC ACID, PLASMA: Lactic Acid, Venous: 1.4 mmol/L (ref 0.5–1.9)

## 2020-07-20 LAB — BRAIN NATRIURETIC PEPTIDE: B Natriuretic Peptide: 1560.3 pg/mL — ABNORMAL HIGH (ref 0.0–100.0)

## 2020-07-20 LAB — TROPONIN I (HIGH SENSITIVITY)
Troponin I (High Sensitivity): 138 ng/L (ref <18)
Troponin I (High Sensitivity): 111 ng/L (ref <18)
Troponin I (High Sensitivity): 100 ng/L (ref <18)

## 2020-07-20 MED ORDER — MEDROXYPROGESTERONE ACETATE 10 MG PO TABS
10.0000 mg | ORAL_TABLET | Freq: Every day | ORAL | Status: DC
  Administered 2020-07-20 – 2020-07-25 (×6): 10 mg via ORAL
  Filled 2020-07-20 (×6): qty 1

## 2020-07-20 MED ORDER — HEPARIN (PORCINE) 25000 UT/250ML-% IV SOLN
1000.0000 [IU]/h | INTRAVENOUS | Status: DC
  Administered 2020-07-21: 1000 [IU]/h via INTRAVENOUS
  Filled 2020-07-20: qty 250

## 2020-07-20 MED ORDER — HEPARIN (PORCINE) 25000 UT/250ML-% IV SOLN
1100.0000 [IU]/h | INTRAVENOUS | Status: DC
  Administered 2020-07-20 (×2): 1100 [IU]/h via INTRAVENOUS
  Filled 2020-07-20: qty 250

## 2020-07-20 NOTE — Consult Note (Addendum)
[place]  [phone] [phone]    INITIAL HEMATOLOGY CONSULTATION  Referring MD:  Dr. Brand Males  Reason for Referral: Anticoagulation for submassive PE  HPI: Ms. Vahey is a 69 year old female with a past medical history significant for diffuse large B-cell lymphoma, history of PE and DVT diagnosed 2017-received Xarelto for several months and then stopped, hypertension, hyperlipidemia, arthritis.  She has been receiving chemotherapy at [facility] with R-mini CHOP and received cycle #4 on 06/28/2020.  She has also been receiving intrathecal chemotherapy.  She was due for cycle 5 of chemotherapy on 8/26 and for intrathecal immunotherapy on 8/27.  PET scan after 3 cycles of chemotherapy showed treatment response with interval resolution of FDG activity, significant decrease in the size and conspicuity of the bilateral cervical adenopathy, persistent right intraparotid hypermetabolic lesion with remission of the other cervical adenopathy is of questionable significance.  The patient presented to the emergency room with lightheadedness, fall, and diarrhea.  The patient had experienced constipation at home and started taking milk of magnesia and prune juice on the evening of 8/25.  On 8/26 she started having frequent bowel movements and fell trying to go to the bathroom felt weak.  She did not have a loss of consciousness with her fall.  She was not able to the floor.  EMS was called and she was found to be hypotensive.  Since she was able to take p.o. liquids and trying to hydrate at home, EMS left.  However, she developed an episode of confusion and shaking and her son called EMS again.  Legs have been more swollen recently but right leg has been more swollen than usual.  She has been more short of breath over the past several days.  In the emergency room, initial lab work showed a hemoglobin of 10.7, hematocrit 33.8, MCV 100.6, 73,000.  Troponin was  elevated at 215.  CTA chest performed in the emergency room showed a large volume pulmonary embolism affecting both lungs including a saddle embolus.  Right ventricular strain with a ratio over 3.  She has a recent or unhealed compression fracture at T12. The patient was evaluated by interventional radiology for consideration of PE lysis of thrombectomy.  Given the patient's stability, they recommended conservative management with a heparin drip but would consider PE lysis versus thrombectomy if she were to clinically deteriorate.  An echocardiogram has been performed which showed LVEF of 70 to 75%, positive Mcconels signs, right ventricular systolic function is severely reduced and right ventricular size is severely enlarged.  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  The patient was seen in the emergency room today.  Her son, [name], is at the bedside.  He assist with providing history.  The patient reports that she had a history of PE and DVT in 2017.  She took Xarelto for several months and then was told to stop.  Is not been on any recent anticoagulation.  The patient is fairly sedentary has been for several years which is likely the risk factor for her prior PE and DVT.  She denies headaches and dizziness.  Denies chest pain or shortness of breath.  She reports abdominal pain which has been longstanding.  Pain seems to shift depending on movement.  Denies nausea, vomiting.  Reports constipation and takes laxatives.  She has longstanding lower extremity  edema with the right leg being more edematous recently.  Denies bleeding such as epistaxis, hemoptysis, hematemesis, hematuria, melena, hematochezia.  The patient lives by herself.  She is divorced.  She has 2 adult children.  Denies history of alcohol and tobacco use.  Hematology was asked see the  patient to make recommendations regarding anticoagulation.    Past Medical History:  Diagnosis Date  . Arthritis   . Edema    B/LLE  . Endometriosis   . High cholesterol   . Hypertension   . Numbness    feet  . Pre-diabetes   . Pulmonary embolism (HCC)    recurrent, second episode in 06/2020  . Walker as ambulation aid   . Wears dentures   . Wears glasses   :    Past Surgical History:  Procedure Laterality Date  . ABDOMINAL HYSTERECTOMY     and BSO  . CATARACT EXTRACTION Left 10/2018  . [place]  . COLONOSCOPY    . EYE SURGERY Bilateral    CATARACT SX  . HERNIA REPAIR  2001   incisional  . JOINT REPLACEMENT Left    Knee  . MAXILLARY ANTROSTOMY Right 01/20/2020   Procedure: MAXILLARY ANTROSTOMY  WITH BIOPSY CALDWELL APPROACH;  Surgeon: Jerrell Belfast, MD;  Location: [place];  Service: ENT;  Laterality: Right;  . MULTIPLE TOOTH EXTRACTIONS  2011  . NASAL ENDOSCOPY Right 02/17/2020   Procedure: NASAL ENDOSCOPY WITH RIGHT INFERIOR TURNBINATE REDUCTION;  Surgeon: Jerrell Belfast, MD;  Location: [place];  Service: ENT;  Laterality: Right;  . neck mass removal     "fatty tissue, not cancerous" per pt's son  . SINUS ENDO WITH FUSION Right 01/20/2020   Procedure: SINUS ENDO WITH FUSION WITH BIOSPY;  Surgeon: Jerrell Belfast, MD;  Location: [place];  Service: ENT;  Laterality: Right;  . TOTAL KNEE ARTHROPLASTY Left 07/17/2014   Procedure: TOTAL KNEE ARTHROPLASTY;  Surgeon: Kerin Salen, MD;  Location: [place];  Service: Orthopedics;  Laterality: Left;  :   CURRENT MEDS: Current Facility-Administered Medications  Medication Dose Route Frequency Provider Last Rate Last Admin  . atorvastatin (LIPITOR) tablet 10 mg  10 mg Oral QPM Ollis, Brandi L, NP   10 mg at 07/19/20 1837  . docusate sodium (COLACE) capsule 100 mg  100 mg Oral BID PRN Alfredo Martinez, Brandi L, NP      . gabapentin (NEURONTIN) capsule 300 mg  300 mg Oral TID Noe Gens L, NP   300 mg at 07/20/20 1505    . heparin ADULT infusion 100 units/mL (25000 units/244mL sodium chloride 0.45%)  1,000 Units/hr Intravenous Continuous Penni Homans, Student-PharmD 10 mL/hr at 07/20/20 1505 1,000 Units/hr at 07/20/20 1505  . lactated ringers infusion   Intravenous Continuous Donita Brooks, NP   Stopped at 07/20/20 0949  . medroxyPROGESTERone (PROVERA) tablet 10 mg  10 mg Oral Daily Magdalen Spatz, NP   10 mg at 07/20/20 1032  . ondansetron (ZOFRAN) tablet 4 mg  4 mg Oral BID Ollis, Brandi L, NP   4 mg at 07/20/20 1008  . oxyCODONE-acetaminophen (PERCOCET) 7.5-325 MG per tablet 1 tablet  1 tablet Oral QID Noe Gens L, NP   1 tablet at 07/20/20 1311  . pantoprazole (PROTONIX) EC tablet 40 mg  40 mg Oral Daily Ollis, Brandi L, NP   40 mg at 07/20/20 1009  . polyethylene glycol (MIRALAX / GLYCOLAX) packet 17 g  17 g Oral Daily PRN Donita Brooks, NP      .  potassium chloride (KLOR-CON) CR tablet 10 mEq  10 mEq Oral BID Ollis, Brandi L, NP   10 mEq at 07/20/20 1009  . prochlorperazine (COMPAZINE) tablet 5 mg  5 mg Oral Q12H Ollis, Brandi L, NP   5 mg at 07/20/20 1008  . sucralfate (CARAFATE) 1 GM/10ML suspension 1 g  1 g Oral TID AC Ollis, Brandi L, NP   1 g at 07/20/20 1312   Current Outpatient Medications  Medication Sig Dispense Refill  . atorvastatin (LIPITOR) 10 MG tablet TAKE ONE TABLET BY MOUTH  ONCE DAILY IN THE EVENING (Patient taking differently: Take 10 mg by mouth every evening. ) 30 tablet 0  . diclofenac Sodium (VOLTAREN) 1 % GEL Apply 2 g topically 4 (four) times daily as needed (pain).    . furosemide (LASIX) 20 MG tablet Take 1 tablet (20 mg total) by mouth daily. 30 tablet 0  . gabapentin (NEURONTIN) 300 MG capsule Take 300 mg by mouth 3 (three) times daily.    Marland Kitchen labetalol (NORMODYNE) 100 MG tablet Take 100 mg by mouth 2 (two) times daily.    . medroxyPROGESTERone (PROVERA) 10 MG tablet Take 10 mg by mouth every evening.     . ondansetron (ZOFRAN) 4 MG tablet Take 4 mg by mouth 2 (two)  times daily.     Marland Kitchen oxyCODONE-acetaminophen (PERCOCET) 7.5-325 MG tablet Take 1 tablet by mouth 4 (four) times daily.     . potassium chloride (KLOR-CON) 10 MEQ tablet Take 10 mEq by mouth 2 (two) times daily.    . prochlorperazine (COMPAZINE) 5 MG tablet Take 5 mg by mouth 2 (two) times daily.     . sucralfate (CARAFATE) 1 GM/10ML suspension Take 1 g by mouth 3 (three) times daily before meals.    . Lidocaine 4 % PTCH Apply 1 patch topically 2 (two) times daily. (Patient not taking: Reported on 05/28/2020) 12 patch 0  . oxyCODONE-acetaminophen (PERCOCET/ROXICET) 5-325 MG tablet Take 1 tablet by mouth every 8 (eight) hours as needed for severe pain. (Patient not taking: Reported on 05/28/2020) 12 tablet 0      Allergies  Allergen Reactions  . Tylenol [Acetaminophen] Other (See Comments)    States "seems like eats my stomach"  . Sulfa Antibiotics Other (See Comments)    Unknown allergic reaction  :  Family History  Problem Relation Age of Onset  . Colon cancer Sister   . Cancer Sister        unsure of type of cancer  :  Social History   Socioeconomic History  . Marital status: Divorced    Spouse name: Not on file  . Number of children: 2  . Years of education: Not on file  . Highest education level: High school graduate  Occupational History  . Not on file  Tobacco Use  . Smoking status: Never Smoker  . Smokeless tobacco: Never Used  Vaping Use  . Vaping Use: Never used  Substance and Sexual Activity  . Alcohol use: No  . Drug use: No  . Sexual activity: Not Currently    Comment: Hysterectoomy  Other Topics Concern  . Not on file  Social History Narrative   Lives alone    Right handed   Social Determinants of Health   Financial Resource Strain:   . Difficulty of Paying Living Expenses: Not on file  Food Insecurity:   . Worried About Charity fundraiser in the Last Year: Not on file  . Ran Out of Food in  the Last Year: Not on file  Transportation Needs:   . Lack  of Transportation (Medical): Not on file  . Lack of Transportation (Non-Medical): Not on file  Physical Activity:   . Days of Exercise per Week: Not on file  . Minutes of Exercise per Session: Not on file  Stress:   . Feeling of Stress : Not on file  Social Connections:   . Frequency of Communication with Friends and Family: Not on file  . Frequency of Social Gatherings with Friends and Family: Not on file  . Attends Religious Services: Not on file  . Active Member of Clubs or Organizations: Not on file  . Attends Archivist Meetings: Not on file  . Marital Status: Not on file  Intimate Partner Violence:   . Fear of Current or Ex-Partner: Not on file  . Emotionally Abused: Not on file  . Physically Abused: Not on file  . Sexually Abused: Not on file  :  REVIEW OF SYSTEMS:  A comprehensive 14 point review of systems was negative except as noted in the HPI.    Exam: Patient Vitals for the past 24 hrs:  BP Pulse Resp SpO2  07/20/20 1144 110/70 (!) 101 (!) 21 98 %  07/20/20 1100 110/70 (!) 102 (!) 25 99 %  07/20/20 1043 118/75 (!) 102 (!) 22 100 %  07/20/20 1000 101/86 (!) 103 (!) 26 99 %  07/20/20 0900 101/86 (!) 105 (!) 21 100 %  07/20/20 0845 113/71 100 (!) 22 100 %  07/20/20 0636 103/71 100 (!) 24 99 %  07/20/20 0345 106/72 96 (!) 25 100 %  07/20/20 0300 113/77 96 (!) 22 98 %  07/20/20 0230 106/66 96 19 98 %  07/20/20 0130 106/74 95 18 99 %  07/20/20 0100 103/73 99 20 99 %  07/20/20 0000 105/75 99 20 99 %  07/19/20 2300 111/68 99 (!) 21 100 %  07/19/20 2100 100/63 100 19 100 %  07/19/20 1930 101/73 (!) 106 (!) 22 99 %  07/19/20 1900 119/77 (!) 106 (!) 22 100 %  07/19/20 1830 122/70 (!) 107 (!) 26 100 %  07/19/20 1630 108/80 (!) 106 (!) 26 98 %  07/19/20 1600 114/72 (!) 102 (!) 24 100 %    General: Elderly female in no acute distress.   Eyes:  no scleral icterus.   ENT:  There were no oropharyngeal lesions.   Neck was without thyromegaly.   Lymphatics:   Negative cervical, supraclavicular or axillary adenopathy.   Respiratory: lungs were clear bilaterally without wheezing or crackles.   Cardiovascular:  Regular rate and rhythm, S1/S2, without murmur, rub or gallop.  Bilateral pitting lower extremity edema, right greater than left.   GI:  abdomen was soft, flat, nontender, nondistended, without organomegaly.   Musculoskeletal: Strength symmetrical in the upper and lower extremities.   Skin exam was without ecchymosis, petechiae.   Neuro exam was nonfocal.  Patient was alert and oriented.  Attention was good.   Language was appropriate.  Mood was normal without depression.  Speech was not pressured.  Thought content was not tangential.    LABS:  Lab Results  Component Value Date   WBC 8.7 07/20/2020   HGB 10.1 (L) 07/20/2020   HCT 32.5 (L) 07/20/2020   PLT 85 (L) 07/20/2020   GLUCOSE 105 (H) 07/20/2020   CHOL 129 02/13/2016   TRIG 61 02/13/2016   HDL 48 02/13/2016   LDLCALC 69 02/13/2016   ALT 15  07/19/2020   AST 24 07/19/2020   NA 138 07/20/2020   K 4.2 07/20/2020   CL 109 07/20/2020   CREATININE 0.69 07/20/2020   BUN 12 07/20/2020   CO2 20 (L) 07/20/2020   INR 1.36 02/13/2016   HGBA1C 5.1 01/17/2020    CT Angio Chest PE W and/or Wo Contrast  Result Date: 07/19/2020 CLINICAL DATA:  Chest pain.  Question pulmonary embolism. EXAM: CT ANGIOGRAPHY CHEST WITH CONTRAST TECHNIQUE: Multidetector CT imaging of the chest was performed using the standard protocol during bolus administration of intravenous contrast. Multiplanar CT image reconstructions and MIPs were obtained to evaluate the vascular anatomy. CONTRAST:  67mL OMNIPAQUE IOHEXOL 350 MG/ML SOLN COMPARISON:  None. FINDINGS: Cardiovascular: Pulmonary arterial opacification is excellent. There is large volume pulmonary embolism affecting both lungs, including a saddle embolus. Diminished perfusion particularly of the left lower lobe. Overall heart size is normal. Right ventricular  strain with a ratio of over 3. No aortic atherosclerotic calcification. Mediastinum/Nodes: No mass or adenopathy. Lungs/Pleura: Lungs are clear presently.  No effusions. Upper Abdomen: Negative Musculoskeletal: Curvature and chronic degenerative change. Recent or unhealed compression fracture at T12. This was present on 05/22/2020 but has shown some further collapse. Review of the MIP images confirms the above findings. IMPRESSION: 1. Large volume pulmonary embolism affecting both lungs, including a saddle embolus. Right ventricular strain with a ratio of over 3. 2. Recent or unhealed compression fracture at T12. This was present on 05/22/2020 but has shown some further collapse. 3. Critical Value/emergent results were called by telephone at the time of interpretation on 07/19/2020 at 1:28 pm to provider [facility] , who verbally acknowledged these results. Electronically Signed   By: Nelson Chimes M.D.   On: 07/19/2020 13:33   DG Chest Port 1 View  Result Date: 07/20/2020 CLINICAL DATA:  Acute. EXAM: PORTABLE CHEST 1 VIEW COMPARISON:  CT 07/19/2020.  Chest x-ray 05/28/2020. FINDINGS: PowerPort catheter in unchanged position with tip over the right atrium. Stable cardiomegaly. Low lung volumes. No focal infiltrate. No pleural effusion or pneumothorax. Degenerative change thoracic spine and both shoulders. IMPRESSION: Cardiomegaly. No pulmonary venous congestion. No acute pulmonary infiltrate. Electronically Signed   By: Marcello Moores  Register   On: 07/20/2020 06:22   DG Knee Complete 4 Views Left  Result Date: 07/19/2020 CLINICAL DATA:  69 year old female status post fall at home today. Pain and swelling. EXAM: LEFT KNEE - COMPLETE 4+ VIEW COMPARISON:  Left knee series 07/20/2014. FINDINGS: Chronic left total knee arthroplasty. No evidence of joint effusion on the cross-table lateral view. Hardware appears stable and normally aligned. Chronic bony overgrowth at the lateral proximal tibia and fibula which might  be ankylosed. No acute osseous abnormality identified. No discrete soft tissue injury. IMPRESSION: Chronic left total knee arthroplasty. No acute fracture or dislocation identified. Electronically Signed   By: Genevie Ann M.D.   On: 07/19/2020 12:51   ECHOCARDIOGRAM COMPLETE  Result Date: 07/19/2020    ECHOCARDIOGRAM REPORT   Patient Name:   Kadasia [facility] Date of Exam: 07/19/2020 Medical Rec #:  [id number]          Height:       61.0 in Accession #:    [id number]         Weight:       240.0 lb Date of Birth:  04-Sep-1951         BSA:          2.041 m Patient Age:    27 years  BP:           108/80 mmHg Patient Gender: F                  HR:           108 bpm. Exam Location:  Inpatient Procedure: 2D Echo STAT ECHO Indications:    acute respiratory insufficiency 518.82  History:        Patient has prior history of Echocardiogram examinations, most                 recent 02/15/2016. Pulmonary embolism, Signs/Symptoms:edema; Risk                 Factors:Dyslipidemia and Hypertension.  Sonographer:    Johny Chess Referring Phys: Noe Gens, L IMPRESSIONS  1. Left ventricular ejection fraction, by estimation, is 70 to 75%. The left ventricle has hyperdynamic function. The left ventricle has no regional wall motion abnormalities. There is mild left ventricular hypertrophy. Left ventricular diastolic parameters are consistent with Grade I diastolic dysfunction (impaired relaxation).  2. Positive Mcconels signs, with severe hypokinesia of the mid free wall with sparing/hypercontractile apex. The enlarged RV leads to underfilling of the LV. . Right ventricular systolic function is severely reduced. The right ventricular size is severely enlarged. There is moderately elevated pulmonary artery systolic pressure.  3. Right atrial size was severely dilated.  4. The mitral valve is normal in structure. No evidence of mitral valve regurgitation. No evidence of mitral stenosis.  5. The aortic valve has an  indeterminant number of cusps. Aortic valve regurgitation is not visualized. No aortic stenosis is present.  6. Moderate pulmonary HTN with PASP 57 mmHg. PASP may underestimate severity of true pulmonary vascular resistance given severe RV dysfunciton.  7. The inferior vena cava is dilated in size with <50% respiratory variability, suggesting right atrial pressure of 15 mmHg. FINDINGS  Left Ventricle: Left ventricular ejection fraction, by estimation, is 70 to 75%. The left ventricle has hyperdynamic function. The left ventricle has no regional wall motion abnormalities. The left ventricular internal cavity size was normal in size. There is mild left ventricular hypertrophy. Left ventricular diastolic parameters are consistent with Grade I diastolic dysfunction (impaired relaxation). Right Ventricle: Positive Mcconels signs, with severe hypokinesia of the mid free wall with sparing/hypercontractile apex. The enlarged RV leads to underfilling of the LV. The right ventricular size is severely enlarged. No increase in right ventricular wall thickness. Right ventricular systolic function is severely reduced. There is moderately elevated pulmonary artery systolic pressure. The tricuspid regurgitant velocity is 3.26 m/s, and with an assumed right atrial pressure of 15 mmHg, the estimated right ventricular systolic pressure is 14.4 mmHg. Left Atrium: Left atrial size was normal in size. Right Atrium: Right atrial size was severely dilated. Pericardium: A small pericardial effusion is present. The pericardial effusion is circumferential. Mitral Valve: The mitral valve is normal in structure. No evidence of mitral valve regurgitation. No evidence of mitral valve stenosis. Tricuspid Valve: The tricuspid valve is normal in structure. Tricuspid valve regurgitation is mild . No evidence of tricuspid stenosis. Aortic Valve: The aortic valve has an indeterminant number of cusps. . There is mild thickening and mild calcification of  the aortic valve. Aortic valve regurgitation is not visualized. No aortic stenosis is present. Mild aortic valve annular calcification. There is mild thickening of the aortic valve. There is mild calcification of the aortic valve. Aortic valve mean gradient measures 5.9 mmHg. Aortic valve peak gradient measures 14.0 mmHg.  Pulmonic Valve: The pulmonic valve was not well visualized. Pulmonic valve regurgitation is not visualized. No evidence of pulmonic stenosis. Aorta: The aortic root is normal in size and structure. Pulmonary Artery: Moderate pulmonary HTN with PASP 57 mmHg. PASP may underestimate severity of true pulmonary vascular resistance given severe RV dysfunciton. Venous: The inferior vena cava is dilated in size with less than 50% respiratory variability, suggesting right atrial pressure of 15 mmHg. IAS/Shunts: No atrial level shunt detected by color flow Doppler.  LEFT VENTRICLE PLAX 2D LVIDd:         3.67 cm Diastology LVIDs:         2.92 cm LV e' lateral:   10.20 cm/s LV PW:         1.08 cm LV E/e' lateral: 4.9 LV IVS:        1.05 cm LV e' medial:    5.87 cm/s                        LV E/e' medial:  8.6  RIGHT VENTRICLE            IVC RV S prime:     5.98 cm/s  IVC diam: 2.52 cm TAPSE (M-mode): 0.9 cm LEFT ATRIUM             Index       RIGHT ATRIUM           Index LA diam:        2.70 cm 1.32 cm/m  RA Area:     22.30 cm LA Vol (A2C):   28.0 ml 13.72 ml/m RA Volume:   77.80 ml  38.12 ml/m LA Vol (A4C):   27.8 ml 13.62 ml/m LA Biplane Vol: 30.0 ml 14.70 ml/m  AORTIC VALVE AV Vmax:           186.77 cm/s AV Vmean:          111.289 cm/s AV VTI:            0.206 m AV Peak Grad:      14.0 mmHg AV Mean Grad:      5.9 mmHg LVOT Vmax:         102.44 cm/s LVOT Vmean:        63.093 cm/s LVOT VTI:          0.141 m LVOT/AV VTI ratio: 0.69  AORTA Ao Root diam: 2.70 cm Ao Asc diam:  2.90 cm MV E velocity: 50.40 cm/s  TRICUSPID VALVE MV A velocity: 85.40 cm/s  TR Peak grad:   42.5 mmHg MV E/A ratio:  0.59         TR Vmax:        326.00 cm/s                             SHUNTS                            Systemic VTI: 0.14 m Carlyle Dolly MD Electronically signed by Carlyle Dolly MD Signature Date/Time: 07/19/2020/6:53:15 PM    Final    VAS [place] LOWER EXTREMITY VENOUS (DVT)  Result Date: 07/20/2020  Lower Venous DVTStudy Indications: Swelling.  Limitations: Body habitus and poor ultrasound/tissue interface. Comparison Study: 03/16/2019- right lower extremity venous duplex: chronic DVT  right popliteal vein. Performing Technologist: Oliver Hum RVT  Examination Guidelines: A complete evaluation includes B-mode imaging, spectral Doppler, color Doppler, and power Doppler as needed of all accessible portions of each vessel. Bilateral testing is considered an integral part of a complete examination. Limited examinations for reoccurring indications may be performed as noted. The reflux portion of the exam is performed with the patient in reverse Trendelenburg.  +---------+---------------+---------+-----------+----------+--------------+ RIGHT    CompressibilityPhasicitySpontaneityPropertiesThrombus Aging +---------+---------------+---------+-----------+----------+--------------+ CFV      Full           Yes      Yes                                 +---------+---------------+---------+-----------+----------+--------------+ SFJ      Full                                                        +---------+---------------+---------+-----------+----------+--------------+ FV Prox  Full                                                        +---------+---------------+---------+-----------+----------+--------------+ FV Mid   Full                                                        +---------+---------------+---------+-----------+----------+--------------+ FV DistalFull                                                         +---------+---------------+---------+-----------+----------+--------------+ POP      Partial        Yes      Yes                  Chronic        +---------+---------------+---------+-----------+----------+--------------+ PTV      Full                                                        +---------+---------------+---------+-----------+----------+--------------+ PERO     Full                                                        +---------+---------------+---------+-----------+----------+--------------+   +---------+---------------+---------+-----------+----------+--------------+ LEFT     CompressibilityPhasicitySpontaneityPropertiesThrombus Aging +---------+---------------+---------+-----------+----------+--------------+ CFV      Full           Yes      Yes                                 +---------+---------------+---------+-----------+----------+--------------+  SFJ      Full                                                        +---------+---------------+---------+-----------+----------+--------------+ FV Prox  Full                                                        +---------+---------------+---------+-----------+----------+--------------+ FV Mid   Partial                                      Acute          +---------+---------------+---------+-----------+----------+--------------+ FV DistalNone           No       No                   Acute          +---------+---------------+---------+-----------+----------+--------------+ PFV      Full                                                        +---------+---------------+---------+-----------+----------+--------------+ POP      None           No       No                   Acute          +---------+---------------+---------+-----------+----------+--------------+ PTV                                                   Not visualized  +---------+---------------+---------+-----------+----------+--------------+ PERO                                                  Not visualized +---------+---------------+---------+-----------+----------+--------------+   Left Technical Findings: Not visualized segments include PTV, peroneal veins.   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 - No cystic structure found in the popliteal fossa.  LEFT: - Findings consistent with acute deep vein thrombosis involving the left femoral vein, and left popliteal vein. - No cystic structure found in the popliteal fossa.  *See table(s) above for measurements and observations. Electronically signed by Servando Snare MD on 07/20/2020 at 11:22:41 AM.    Final      ASSESSMENT AND PLAN:  1.  Acute saddle pulmonary embolism 2.  Bilateral DVT 3.  Diffuse large B-cell lymphoma 4.  Anemia 5.  Thrombocytopenia 6.  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  She has  a prior history of PE and DVT about 4 years ago.  She was on anticoagulation with Xarelto for several months (exact timeframe unclear).  The patient is not a candidate for PE lysis or thrombectomy due to thrombocytopenia and high risk of bleeding.  On a heparin drip. -Discussed risk factors for development of PE and DVT with the patient and her son.  Recommend heparin drip for least 48 hours.  We can then transition her to other anticoagulation.  I briefly discussed her anticoagulation with the patient and her son including DOAC versus Lovenox versus warfarin.  Further recommendation per Dr. Marin Olp. -The patient will follow up with her primary oncologist for further management of her diffuse large B-cell lymphoma. -Hemoglobin appears to be at baseline (9-10).  Monitor hemoglobin and transfuse for hemoglobin less than 8. -The patient has thrombocytopenia  likely due to recent chemotherapy.  He has no active bleeding.  Monitor this closely and transfuse platelets for platelet count less than 10,000 or active bleeding.  Thank you for this referral.  Mikey Bussing, DNP, AGPCNP-BC, AOCNP Mon/Tues/Thurs/Fri 7am-5pm; Off Wednesdays Cell: [phone]  ADDENDUM: I saw and examined Ms. Kolodny.  I must say that this is an incredibly complex situation.  I really do not think that there is truly a right or wrong way to try to treat this pulmonary embolism and lower extremity thromboembolic disease.  If she did not have history of lymphoma, I would think that thrombolytic therapy would clearly be appropriate for her.  It would certainly be effective.  I think there would be acceptable risk of bleeding.  However, given the fact that she has non-Hodgkin's lymphoma and is on chemotherapy and has some mild thrombocytopenia at the present time, I think that the best course of action is going to be continued heparin.  There really are very very few studies that look at thrombolytic therapy in cancer patients.  I know that ASCO recommendations for thrombolytic therapy in cancer patients are on a "Per patient" situation.  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  This is certainly not a primary CNS lymphoma but there are some similarities.  I know that she has responded to treatment by MRI and PET scan.  However, she still has some disease that is noted that seems to be fairly extensive in the skull base area.  I think that thrombolytic therapy in her probably would have about a 10-12% incidence of bleeding.  When I saw her, and I spent about an hour with her, she seemed relatively stable.  Her oxygen levels were high.  Her heart rate was slightly tachycardic but her blood pressure was adequate.  I have noted on her echocardiogram that the right  ventricle certainly is having problems.  There obviously is a significant pulmonary pressure that is affecting the right ventricular function.  I think that if there is any evidence of cardiopulmonary deterioration over the next day or so, then I think we would be forced to utilize thrombolytic therapy realizing that there is that small but real risk of bleeding.  I spoke to her son, [name], who is very nice.  I spoke to him on the phone.  I explained to him the dilemma that we are dealing with here.  I explained to him that we would recommend heparin for right now.  If she deteriorated then we would have to use thrombolytic therapy.  He understands this.  I would keep her on heparin through the weekend.  She will then need Lovenox as an outpatient.  I would favor Lovenox being given every 12 hours for the first month as an outpatient and then going to every 24 hours after that.  At some point in the future, she can probably be transitioned over to one of the new oral anticoagulants.  I am sure that her oncologist at [facility] will be in charge of managing her outpatient anticoagulation.  Without question, this is a very difficult situation.  I am sure you could get recommendations for using thrombolytic therapy right now.  In my opinion, she is relatively stable at this point.  She has the thrombocytopenia which is not bad and I am sure her platelet count is on the way up.  We had a very good prayer together.  Her faith is very strong.  She is very thankful for the great care that she is getting by the staff in the ER.  [name], MD  Jeneen Rinks 1:5

## 2020-07-20 NOTE — ED Notes (Signed)
Assumed care of patient at this time, nad noted, sr up x2, bed locked and low, call bell w/I reach.  Will continue to monitor. ° °

## 2020-07-20 NOTE — Progress Notes (Signed)
ANTICOAGULATION CONSULT NOTE - Follow Up Consult  Pharmacy Consult for heparin Indication: acute bilateral LE DVT and bilateral PE  Allergies  Allergen Reactions  . Tylenol [Acetaminophen] Other (See Comments)    States "seems like eats my stomach"  . Sulfa Antibiotics Other (See Comments)    Unknown allergic reaction    Patient Measurements:  Weight 109 kg charted on 05/28/20, height 61 inches   Heparin Dosing Weight: 75 kg  Vital Signs: BP: 101/86 (08/27 0900) Pulse Rate: 105 (08/27 0900)  Labs: Recent Labs    07/19/20 1202 07/19/20 1340 07/19/20 1950 07/19/20 2321 07/20/20 0100 07/20/20 0629  HGB 10.7*  --   --   --   --  10.1*  HCT 33.8*  --   --   --   --  32.5*  PLT 73*  --   --   --   --  85*  HEPARINUNFRC  --   --   --  1.14*  --   --   CREATININE 0.83  --   --   --   --  0.69  TROPONINIHS 215*   < > 173*  --  138* 111*   < > = values in this interval not displayed.    CrCl cannot be calculated (Unknown ideal weight.).   Assessment: Pt is a 69 YO AAF with hx of lymphoma on chemotherapy prior to admission, endometriosis with uterine bleeding, and hx of PE in 2017 (treated with Xarelto for 6 months), presented to the ED on 07/19/20 after falling d/t weakness. CTA chest scan revealed large PE affecting both lungs, including a saddle embolus with noted right heart strain. Lower extremity doppler on 8/26 came back positive for bilateral DVTs. Pt is currently on heparin drip for acute VTEs.   07/20/20: - Heparin level supra-therapeutic at 0.77 - CBC: platelets are low, but appears to be low with prior visits, pt also on chemotherapy, Hgb stable - No bleeding documented - SCr: 0.69  Goal of Therapy:  Heparin level 0.3-0.7 units/ml Monitor platelets by anticoagulation protocol: Yes   Plan:  - Decrease heparin rate to 1,000 units/hour - Recheck heparin levels 8 hours after rate change - Monitor platelet and Hgb levels - Monitor for signs/symptoms of  bleeding   Trena Dunavan 07/20/2020,9:41 AM

## 2020-07-20 NOTE — Progress Notes (Signed)
ANTICOAGULATION CONSULT NOTE -  Consult  Pharmacy Consult for IV heparin Indication: pulmonary embolus  Allergies  Allergen Reactions  . Tylenol [Acetaminophen] Other (See Comments)    States "seems like eats my stomach"  . Sulfa Antibiotics Other (See Comments)    Unknown allergic reaction    Patient Measurements:   Heparin Dosing Weight: 75 kg (using 05/24/20 weight)  Vital Signs: BP: 106/74 (08/27 0130) Pulse Rate: 95 (08/27 0130)  Labs: Recent Labs    07/19/20 1202 07/19/20 1202 07/19/20 1340 07/19/20 1950 07/19/20 2321 07/20/20 0100  HGB 10.7*  --   --   --   --   --   HCT 33.8*  --   --   --   --   --   PLT 73*  --   --   --   --   --   HEPARINUNFRC  --   --   --   --  1.14*  --   CREATININE 0.83  --   --   --   --   --   TROPONINIHS 215*   < > 218* 173*  --  138*   < > = values in this interval not displayed.    CrCl cannot be calculated (Unknown ideal weight.).   Medical History: Past Medical History:  Diagnosis Date  . Arthritis   . Edema    B/LLE  . Endometriosis   . High cholesterol   . Hypertension   . Numbness    feet  . Pre-diabetes   . Pulmonary embolism (HCC)    recurrent, second episode in 06/2020  . Walker as ambulation aid   . Wears dentures   . Wears glasses     Medications:  (Not in a hospital admission)  Scheduled:  . atorvastatin  10 mg Oral QPM  . gabapentin  300 mg Oral TID  . ondansetron  4 mg Oral BID  . oxyCODONE-acetaminophen  1 tablet Oral QID  . pantoprazole  40 mg Oral Daily  . potassium chloride  10 mEq Oral BID  . prochlorperazine  5 mg Oral Q12H  . sucralfate  1 g Oral TID AC   Infusions:  . heparin 1,100 Units/hr (07/20/20 0111)  . lactated ringers 50 mL/hr at 07/19/20 1647    Assessment: 77 yoF with DLCBL currently undergoing chemo at [facility], presents with weakness/fall. Initially worked up for dehydration, but CTA reveals massive saddle PE with extensive RH strain (RV:LV ratio > 3). Pharmacy to  start heparin infusion.   Of note, PMH significant for PE in March 2017, treated with Xarelto through Oct 2018. Also had hx endometrial bleeding which resolved with hormonal treatment.   Baseline INR, aPTT: not done  Prior anticoagulation: none, stopped Xarelto in 2018  Significant events:  Today, 07/20/2020:  CBC: Hgb and Plt both low d/t chemo; per chart review patient's last plt nadir was ~70k and recovered within 14 days  SCr at baseline  No bleeding or infusion issues per nursing  HL is 1.14, supratherapeutic  No line or bleeding issues per RN   Goal of Therapy: Heparin level 0.3-0.7 units/ml Monitor platelets by anticoagulation protocol: Yes  Plan:  Decrease heparin to 1100 units/hr IV infusion  Check heparin level 8 hrs after rate change   Daily CBC, daily heparin level once stable  Monitor for signs of bleeding or thrombosis    Royetta Asal, PharmD, BCPS 07/20/2020 2:10 AM
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
## 2020-07-21 ENCOUNTER — Inpatient Hospital Stay (HOSPITAL_COMMUNITY): Payer: PPO

## 2020-07-21 DIAGNOSIS — R778 Other specified abnormalities of plasma proteins: Secondary | ICD-10-CM

## 2020-07-21 LAB — TROPONIN I (HIGH SENSITIVITY): Troponin I (High Sensitivity): 65 ng/L — ABNORMAL HIGH (ref <18)

## 2020-07-21 LAB — BASIC METABOLIC PANEL
Anion gap: 12 (ref 5–15)
BUN: 12 mg/dL (ref 8–23)
CO2: 20 mmol/L — ABNORMAL LOW (ref 22–32)
Calcium: 8.7 mg/dL — ABNORMAL LOW (ref 8.9–10.3)
Chloride: 106 mmol/L (ref 98–111)
Creatinine, Ser: 0.89 mg/dL (ref 0.44–1.00)
GFR calc Af Amer: >60 mL/min (ref >60)
GFR calc non Af Amer: >60 mL/min (ref >60)
Glucose, Bld: 105 mg/dL — ABNORMAL HIGH (ref 70–99)
Potassium: 4.6 mmol/L (ref 3.5–5.1)
Sodium: 138 mmol/L (ref 135–145)

## 2020-07-21 LAB — HEPARIN LEVEL (UNFRACTIONATED)
Heparin Unfractionated: 0.44 IU/mL (ref 0.30–0.70)
Heparin Unfractionated: 0.56 IU/mL (ref 0.30–0.70)

## 2020-07-21 LAB — CBC
HCT: 31.5 % — ABNORMAL LOW (ref 36.0–46.0)
Hemoglobin: 9.7 g/dL — ABNORMAL LOW (ref 12.0–15.0)
MCH: 31.3 pg (ref 26.0–34.0)
MCHC: 30.8 g/dL (ref 30.0–36.0)
MCV: 101.6 fL — ABNORMAL HIGH (ref 80.0–100.0)
Platelets: 106 10*3/uL — ABNORMAL LOW (ref 150–400)
RBC: 3.1 MIL/uL — ABNORMAL LOW (ref 3.87–5.11)
RDW: 17.2 % — ABNORMAL HIGH (ref 11.5–15.5)
WBC: 9.5 10*3/uL (ref 4.0–10.5)
nRBC: 0.9 % — ABNORMAL HIGH (ref 0.0–0.2)

## 2020-07-21 LAB — BRAIN NATRIURETIC PEPTIDE: B Natriuretic Peptide: 1382.8 pg/mL — ABNORMAL HIGH (ref 0.0–100.0)

## 2020-07-21 LAB — MAGNESIUM: Magnesium: 2 mg/dL (ref 1.7–2.4)

## 2020-07-21 MED ORDER — SODIUM CHLORIDE 0.9% FLUSH
10.0000 mL | INTRAVENOUS | Status: DC | PRN
  Administered 2020-07-25 (×2): 10 mL

## 2020-07-21 MED ORDER — CHLORHEXIDINE GLUCONATE CLOTH 2 % EX PADS
6.0000 | MEDICATED_PAD | Freq: Every day | CUTANEOUS | Status: DC
  Administered 2020-07-21 – 2020-07-25 (×5): 6 via TOPICAL

## 2020-07-21 MED ORDER — SODIUM CHLORIDE 0.9% FLUSH
10.0000 mL | Freq: Two times a day (BID) | INTRAVENOUS | Status: DC
  Administered 2020-07-22 – 2020-07-24 (×4): 10 mL

## 2020-07-21 NOTE — Progress Notes (Signed)
PROGRESS NOTE   Lauren Santana [facility]  RAQ:[id number]    DOB: Dec 05, 1950    DOA: 07/19/2020  PCP: Lucianne Lei, MD   I have briefly reviewed patients previous medical records in [facility].  Chief Complaint  Patient presents with  . Diarrhea  . Fall  . Knee Pain    left    Brief Narrative:  69 year old female with PMH of diffuse large B cell lymphoma being treated at [facility], pulmonary embolism in 2017 treated with ~6 months of Xarelto, prediabetes, HTN, HLD and endometriosis, presented to the [facility] on 8/26 with reports of diarrhea, lightheadedness and a fall.  CTA chest in ED showed large volume bilateral pulmonary embolism, including a saddle embolus, RV strain with ratio >3, initiated on IV heparin and CCM admitted the patient to the hospital.  Care transferred to [facility] on 8/28.  Hematology consulting.   Assessment & Plan:  Active Problems:   Pulmonary embolism (HCC)   Acute submassive pulmonary embolism with right heart strain, recurrent/acute left femoral and popliteal vein DVT/chronic right popliteal vein DVT: CTA chest 8/26 showed large volume pulmonary embolus bilaterally including a saddle embolus with RV strain.  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  2D echo 8/26: LVEF 45-62%, grade 1 diastolic dysfunction, positive McConnell sign with severe hypokinesia of the mid free wall with sparing/hypercontractile apex, severely reduced RV systolic function.  Troponin peaked to 218 but has gradually reduced to 65.  Troponin elevation likely secondary to demand ischemia.  As per PCCM/hematology input, not a candidate for lytic treatment but only as last resort, continue IV heparin x5 days and then hematology recommends transitioning to Lovenox.  Hemodynamically stable and not hypoxic.  Macrocytic anemia/anemia of chronic disease and malignancy Hemoglobin stable in the 9-10 g range.  Follow daily CBCs while on IV  heparin.  Thrombocytopenia chronic May be related to her lymphoma or its treatment.  Presented with platelet count of 73 which is improved to 106.  Follow daily CBCs.  Essential hypertension: Controlled.  Holding home Lasix, labetalol due to submassive PE to avoid hypotension/hemodynamic compromise.  Dyslipidemia  Chronic pain: Continue home oxycodone.  Large B-cell lymphoma: Outpatient follow-up with [facility].  Chronic constipation, diarrhea s/p laxative use Seems to have resolved.  Compression fracture of T12: No back pain reported today.  Body mass index is 34.37 kg/m.   GOC: PCCM had this discussion with her on 8/27 at which point she stated that she would be accepting of short-term support but would not "want to live on machines".   DVT prophylaxis: IV heparin ongoing Code Status: Full Family Communication: Discussed in detail with patient's son, updated care and answered questions. Disposition:  Status is: Inpatient  Remains inpatient appropriate because:Inpatient level of care appropriate due to severity of illness   Dispo: The patient is from: Home              Anticipated d/c is to: Home              Anticipated d/c date is: 3 days              Patient currently is not medically stable to d/c.        Consultants:   None  Procedures:   None  Antimicrobials:   None   Subjective:  Patient interviewed and examined along with her female nurse in the room.  Patient denies complaints.  No dyspnea, chest pain,  dizziness or lightheadedness.  No bleeding reported.  Objective:   Vitals:   07/21/20 0200 07/21/20 0600 07/21/20 1102 07/21/20 1115  BP: 124/80 113/76 101/78   Pulse: 90 (!) 102 (!) 102   Resp: 20  (!) 23 20  Temp: 97.7 F (36.5 C) 98 F (36.7 C) 98.7 F (37.1 C)   TempSrc: Oral Oral Oral   SpO2: 100% 98% 99%   Weight: 82.5 kg     Height: 5\' 1"  (1.549 m)       General exam: Pleasant elderly female, moderately built and obese lying  comfortably propped up in bed without distress. Respiratory system: Clear to auscultation. Respiratory effort normal. Cardiovascular system: S1 & S2 heard, RRR. No JVD, murmurs, rubs, gallops or clicks.  Trace bilateral leg edema.  Telemetry personally reviewed: ST in the 100s/110s. Gastrointestinal system: Abdomen is nondistended, soft and nontender. No organomegaly or masses felt. Normal bowel sounds heard. Central nervous system: Alert and oriented. No focal neurological deficits. Extremities: Symmetric 5 x 5 power. Skin: No rashes, lesions or ulcers Psychiatry: Judgement and insight appear normal. Mood & affect appropriate.     Data Reviewed:   I have personally reviewed following labs and imaging studies   CBC: Recent Labs  Lab 07/19/20 1202 07/20/20 0629 07/21/20 0700  WBC 10.4 8.7 9.5  NEUTROABS 7.4  --   --   HGB 10.7* 10.1* 9.7*  HCT 33.8* 32.5* 31.5*  MCV 100.6* 100.6* 101.6*  PLT 73* 85* 106*    Basic Metabolic Panel: Recent Labs  Lab 07/19/20 1202 07/20/20 0629 07/21/20 0700  NA 141 138 138  K 4.1 4.2 4.6  CL 109 109 106  CO2 21* 20* 20*  GLUCOSE 137* 105* 105*  BUN 14 12 12   CREATININE 0.83 0.69 0.89  CALCIUM 9.1 8.6* 8.7*  MG  --  2.0 2.0  PHOS  --  3.5  --     Liver Function Tests: Recent Labs  Lab 07/19/20 1202  AST 24  ALT 15  ALKPHOS 86  BILITOT 1.1  PROT 5.7*  ALBUMIN 3.0*    CBG: No results for input(s): GLUCAP in the last 168 hours.  Microbiology Studies:   Recent Results (from the past 240 hour(s))  SARS Coronavirus 2 by RT PCR (hospital order, performed in [facility] hospital lab) Nasopharyngeal Nasopharyngeal Swab     Status: None   Collection Time: 07/19/20  1:40 PM   Specimen: Nasopharyngeal Swab  Result Value Ref Range Status   SARS Coronavirus 2 NEGATIVE NEGATIVE Final    Comment: (NOTE) SARS-CoV-2 target nucleic acids are NOT DETECTED.  The SARS-CoV-2 RNA is generally detectable in upper and lower respiratory  specimens during the acute phase of infection. The lowest concentration of SARS-CoV-2 viral copies this assay can detect is 250 copies / mL. A negative result does not preclude SARS-CoV-2 infection and should not be used as the sole basis for treatment or other patient management decisions.  A negative result may occur with improper specimen collection / handling, submission of specimen other than nasopharyngeal swab, presence of viral mutation(s) within the areas targeted by this assay, and inadequate number of viral copies (<250 copies / mL). A negative result must be combined with clinical observations, patient history, and epidemiological information.  Fact Sheet for Patients:   StrictlyIdeas.no  Fact Sheet for Healthcare Providers: BankingDealers.co.za  This test is not yet approved or  cleared by the [place] FDA and has been authorized for detection and/or diagnosis of SARS-CoV-2  by FDA under an Emergency Use Authorization (EUA).  This EUA will remain in effect (meaning this test can be used) for the duration of the COVID-19 declaration under Section 564(b)(1) of the Act, 21 U.S.C. section 360bbb-3(b)(1), unless the authorization is terminated or revoked sooner.  Performed at [facility], [place] [address], [place], [place] [address]      Radiology Studies:  DG CHEST PORT 1 VIEW  Result Date: 07/21/2020 CLINICAL DATA:  Pulmonary embolism EXAM: PORTABLE CHEST 1 VIEW COMPARISON:  07/20/2020 FINDINGS: There is a right chest wall port a catheter with tip in the cavoatrial junction. Stable cardiomediastinal contours. No pleural effusion or edema. No airspace densities identified. Degenerative type change noted within both glenohumeral joints and both AC joints. IMPRESSION: No active cardiopulmonary abnormalities. Electronically Signed   By: Kerby Moors M.D.   On: 07/21/2020 06:35   DG Chest Port 1 View   Result Date: 07/20/2020 CLINICAL DATA:  Acute. EXAM: PORTABLE CHEST 1 VIEW COMPARISON:  CT 07/19/2020.  Chest x-ray 05/28/2020. FINDINGS: PowerPort catheter in unchanged position with tip over the right atrium. Stable cardiomegaly. Low lung volumes. No focal infiltrate. No pleural effusion or pneumothorax. Degenerative change thoracic spine and both shoulders. IMPRESSION: Cardiomegaly. No pulmonary venous congestion. No acute pulmonary infiltrate. Electronically Signed   By: Marcello Moores  Register   On: 07/20/2020 06:22   ECHOCARDIOGRAM COMPLETE  Result Date: 07/19/2020    ECHOCARDIOGRAM REPORT   Patient Name:   Lauren Santana [facility] Date of Exam: 07/19/2020 Medical Rec #:  [id number]          Height:       61.0 in Accession #:    [id number]         Weight:       240.0 lb Date of Birth:  1951/03/17         BSA:          2.041 m Patient Age:    70 years           BP:           108/80 mmHg Patient Gender: F                  HR:           108 bpm. Exam Location:  Inpatient Procedure: 2D Echo STAT ECHO Indications:    acute respiratory insufficiency 518.82  History:        Patient has prior history of Echocardiogram examinations, most                 recent 02/15/2016. Pulmonary embolism, Signs/Symptoms:edema; Risk                 Factors:Dyslipidemia and Hypertension.  Sonographer:    Johny Chess Referring Phys: Noe Gens, L IMPRESSIONS  1. Left ventricular ejection fraction, by estimation, is 70 to 75%. The left ventricle has hyperdynamic function. The left ventricle has no regional wall motion abnormalities. There is mild left ventricular hypertrophy. Left ventricular diastolic parameters are consistent with Grade I diastolic dysfunction (impaired relaxation).  2. Positive Mcconels signs, with severe hypokinesia of the mid free wall with sparing/hypercontractile apex. The enlarged RV leads to underfilling of the LV. . Right ventricular systolic function is severely reduced. The right ventricular size is severely  enlarged. There is moderately elevated pulmonary artery systolic pressure.  3. Right atrial size was severely dilated.  4. The mitral valve is normal in structure. No evidence of mitral valve regurgitation. No  evidence of mitral stenosis.  5. The aortic valve has an indeterminant number of cusps. Aortic valve regurgitation is not visualized. No aortic stenosis is present.  6. Moderate pulmonary HTN with PASP 57 mmHg. PASP may underestimate severity of true pulmonary vascular resistance given severe RV dysfunciton.  7. The inferior vena cava is dilated in size with <50% respiratory variability, suggesting right atrial pressure of 15 mmHg. FINDINGS  Left Ventricle: Left ventricular ejection fraction, by estimation, is 70 to 75%. The left ventricle has hyperdynamic function. The left ventricle has no regional wall motion abnormalities. The left ventricular internal cavity size was normal in size. There is mild left ventricular hypertrophy. Left ventricular diastolic parameters are consistent with Grade I diastolic dysfunction (impaired relaxation). Right Ventricle: Positive Mcconels signs, with severe hypokinesia of the mid free wall with sparing/hypercontractile apex. The enlarged RV leads to underfilling of the LV. The right ventricular size is severely enlarged. No increase in right ventricular wall thickness. Right ventricular systolic function is severely reduced. There is moderately elevated pulmonary artery systolic pressure. The tricuspid regurgitant velocity is 3.26 m/s, and with an assumed right atrial pressure of 15 mmHg, the estimated right ventricular systolic pressure is 77.8 mmHg. Left Atrium: Left atrial size was normal in size. Right Atrium: Right atrial size was severely dilated. Pericardium: A small pericardial effusion is present. The pericardial effusion is circumferential. Mitral Valve: The mitral valve is normal in structure. No evidence of mitral valve regurgitation. No evidence of mitral valve  stenosis. Tricuspid Valve: The tricuspid valve is normal in structure. Tricuspid valve regurgitation is mild . No evidence of tricuspid stenosis. Aortic Valve: The aortic valve has an indeterminant number of cusps. . There is mild thickening and mild calcification of the aortic valve. Aortic valve regurgitation is not visualized. No aortic stenosis is present. Mild aortic valve annular calcification. There is mild thickening of the aortic valve. There is mild calcification of the aortic valve. Aortic valve mean gradient measures 5.9 mmHg. Aortic valve peak gradient measures 14.0 mmHg. Pulmonic Valve: The pulmonic valve was not well visualized. Pulmonic valve regurgitation is not visualized. No evidence of pulmonic stenosis. Aorta: The aortic root is normal in size and structure. Pulmonary Artery: Moderate pulmonary HTN with PASP 57 mmHg. PASP may underestimate severity of true pulmonary vascular resistance given severe RV dysfunciton. Venous: The inferior vena cava is dilated in size with less than 50% respiratory variability, suggesting right atrial pressure of 15 mmHg. IAS/Shunts: No atrial level shunt detected by color flow Doppler.  LEFT VENTRICLE PLAX 2D LVIDd:         3.67 cm Diastology LVIDs:         2.92 cm LV e' lateral:   10.20 cm/s LV PW:         1.08 cm LV E/e' lateral: 4.9 LV IVS:        1.05 cm LV e' medial:    5.87 cm/s                        LV E/e' medial:  8.6  RIGHT VENTRICLE            IVC RV S prime:     5.98 cm/s  IVC diam: 2.52 cm TAPSE (M-mode): 0.9 cm LEFT ATRIUM             Index       RIGHT ATRIUM           Index LA diam:  2.70 cm 1.32 cm/m  RA Area:     22.30 cm LA Vol (A2C):   28.0 ml 13.72 ml/m RA Volume:   77.80 ml  38.12 ml/m LA Vol (A4C):   27.8 ml 13.62 ml/m LA Biplane Vol: 30.0 ml 14.70 ml/m  AORTIC VALVE AV Vmax:           186.77 cm/s AV Vmean:          111.289 cm/s AV VTI:            0.206 m AV Peak Grad:      14.0 mmHg AV Mean Grad:      5.9 mmHg LVOT Vmax:          102.44 cm/s LVOT Vmean:        63.093 cm/s LVOT VTI:          0.141 m LVOT/AV VTI ratio: 0.69  AORTA Ao Root diam: 2.70 cm Ao Asc diam:  2.90 cm MV E velocity: 50.40 cm/s  TRICUSPID VALVE MV A velocity: 85.40 cm/s  TR Peak grad:   42.5 mmHg MV E/A ratio:  0.59        TR Vmax:        326.00 cm/s                             SHUNTS                            Systemic VTI: 0.14 m Carlyle Dolly MD Electronically signed by Carlyle Dolly MD Signature Date/Time: 07/19/2020/6:53:15 PM    Final    VAS [place] LOWER EXTREMITY VENOUS (DVT)  Result Date: 07/20/2020  Lower Venous DVTStudy Indications: Swelling.  Limitations: Body habitus and poor ultrasound/tissue interface. Comparison Study: 03/16/2019- right lower extremity venous duplex: chronic DVT                   right popliteal vein. Performing Technologist: Oliver Hum RVT  Examination Guidelines: A complete evaluation includes B-mode imaging, spectral Doppler, color Doppler, and power Doppler as needed of all accessible portions of each vessel. Bilateral testing is considered an integral part of a complete examination. Limited examinations for reoccurring indications may be performed as noted. The reflux portion of the exam is performed with the patient in reverse Trendelenburg.  +---------+---------------+---------+-----------+----------+--------------+ RIGHT    CompressibilityPhasicitySpontaneityPropertiesThrombus Aging +---------+---------------+---------+-----------+----------+--------------+ CFV      Full           Yes      Yes                                 +---------+---------------+---------+-----------+----------+--------------+ SFJ      Full                                                        +---------+---------------+---------+-----------+----------+--------------+ FV Prox  Full                                                        +---------+---------------+---------+-----------+----------+--------------+ FV  Mid    Full                                                        +---------+---------------+---------+-----------+----------+--------------+ FV DistalFull                                                        +---------+---------------+---------+-----------+----------+--------------+ POP      Partial        Yes      Yes                  Chronic        +---------+---------------+---------+-----------+----------+--------------+ PTV      Full                                                        +---------+---------------+---------+-----------+----------+--------------+ PERO     Full                                                        +---------+---------------+---------+-----------+----------+--------------+   +---------+---------------+---------+-----------+----------+--------------+ LEFT     CompressibilityPhasicitySpontaneityPropertiesThrombus Aging +---------+---------------+---------+-----------+----------+--------------+ CFV      Full           Yes      Yes                                 +---------+---------------+---------+-----------+----------+--------------+ SFJ      Full                                                        +---------+---------------+---------+-----------+----------+--------------+ FV Prox  Full                                                        +---------+---------------+---------+-----------+----------+--------------+ FV Mid   Partial                                      Acute          +---------+---------------+---------+-----------+----------+--------------+ FV DistalNone           No       No                   Acute          +---------+---------------+---------+-----------+----------+--------------+ PFV      Full                                                        +---------+---------------+---------+-----------+----------+--------------+  POP      None           No       No                    Acute          +---------+---------------+---------+-----------+----------+--------------+ PTV                                                   Not visualized +---------+---------------+---------+-----------+----------+--------------+ PERO                                                  Not visualized +---------+---------------+---------+-----------+----------+--------------+   Left Technical Findings: Not visualized segments include PTV, peroneal veins.   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 - No cystic structure found in the popliteal fossa.  LEFT: - Findings consistent with acute deep vein thrombosis involving the left femoral vein, and left popliteal vein. - No cystic structure found in the popliteal fossa.  *See table(s) above for measurements and observations. Electronically signed by Servando Snare MD on 07/20/2020 at 11:22:41 AM.    Final      Scheduled Meds:   . atorvastatin  10 mg Oral QPM  . Chlorhexidine Gluconate Cloth  6 each Topical Daily  . gabapentin  300 mg Oral TID  . medroxyPROGESTERone  10 mg Oral Daily  . ondansetron  4 mg Oral BID  . oxyCODONE-acetaminophen  1 tablet Oral QID  . pantoprazole  40 mg Oral Daily  . potassium chloride  10 mEq Oral BID  . prochlorperazine  5 mg Oral Q12H  . sodium chloride flush  10-40 mL Intracatheter Q12H  . sucralfate  1 g Oral TID AC    Continuous Infusions:   . heparin 1,000 Units/hr (07/21/20 0931)  . lactated ringers Stopped (07/20/20 0949)     LOS: 2 days     Vernell Leep, MD, [place], [facility]. [facility]    To contact the attending provider between 7A-7P or the covering provider during after hours 7P-7A, please log into the web site [email or web address] and access using universal [place] password for that web site. If you do not have the password, please call the hospital operator.  07/21/2020, 2:26 PM

## 2020-07-21 NOTE — Progress Notes (Signed)
ANTICOAGULATION CONSULT NOTE - Follow Up Consult  Pharmacy Consult for heparin Indication: acute bilateral LE DVT and bilateral PE  Allergies  Allergen Reactions  . Tylenol [Acetaminophen] Other (See Comments)    States "seems like eats my stomach"  . Sulfa Antibiotics Other (See Comments)    Unknown allergic reaction    Patient Measurements:  Weight 109 kg charted on 05/28/20, height 61 inches   Heparin Dosing Weight: 75 kg  Vital Signs: Temp: 99 F (37.2 C) (08/27 2040) Temp Source: Oral (08/27 2040) BP: 113/72 (08/27 2115) Pulse Rate: 100 (08/27 2115)  Labs: Recent Labs    07/19/20 1202 07/19/20 1340 07/19/20 1950 07/19/20 2321 07/20/20 0100 07/20/20 0629 07/20/20 1002 07/20/20 1517 07/20/20 2348  HGB 10.7*  --   --   --   --  10.1*  --   --   --   HCT 33.8*  --   --   --   --  32.5*  --   --   --   PLT 73*  --   --   --   --  85*  --   --   --   HEPARINUNFRC  --   --   --  1.14*  --   --  0.77*  --  0.56  CREATININE 0.83  --   --   --   --  0.69  --   --   --   TROPONINIHS 215*   < >   < >  --  138* 111*  --  100*  --    < > = values in this interval not displayed.    CrCl cannot be calculated (Unknown ideal weight.).   Assessment: Pt is a 69 YO AAF with hx of lymphoma on chemotherapy prior to admission, endometriosis with uterine bleeding, and hx of PE in 2017 (treated with Xarelto for 6 months), presented to the ED on 07/19/20 after falling d/t weakness. CTA chest scan revealed large PE affecting both lungs, including a saddle embolus with noted right heart strain. Lower extremity doppler on 8/26 came back positive for bilateral DVTs. Pt is currently on heparin drip for acute VTEs.   07/21/20 - Heparin level  therapeutic at 0.56 - CBC: platelets are low, but appears to be low with prior visits, pt also on chemotherapy, Hgb stable - No bleeding documented - SCr: 0.69  Goal of Therapy:  Heparin level 0.3-0.7 units/ml Monitor platelets by anticoagulation  protocol: Yes   Plan:  - Continue heparin rate to 1,000 units/hour - Recheck confirmatory heparin levels in 8 hours  - Monitor platelet and Hgb levels - Monitor for signs/symptoms of bleeding   Royetta Asal, PharmD, BCPS 07/21/2020 12:59 AM

## 2020-07-21 NOTE — Progress Notes (Signed)
Ms. Lauren Santana is now up on 4 E. She seems be doing pretty well. She is off oxygen. She is not have any complaints of chest pain. There is no obvious shortness of breath. She is on infusional heparin.  She has had no problems with bleeding. There is no headache. She has had no nausea or vomiting.  Her labs today show white cell count of 9.5. Platelet count 106,000. Hemoglobin 9.7. BUN 12 creatinine 0.89. She has a incredibly high beta natruretic peptide of 1382.  Her platelet count is coming up. I would suspect that this is the chemotherapy effect starting to wear off.  Her vital signs all look pretty stable. Temperature 98. Pulse 102. Blood pressure 113/76. Her lungs sound clear bilaterally. I do not hear any obvious wheezes. She has good air movement bilaterally. Cardiac exam slightly tachycardic but regular. Abdomen is soft. She has decent bowel sounds. Extremities shows no obvious clubbing, cyanosis or edema. Neurological exam is nonfocal.  Ms. Lauren Santana has the submassive pulmonary embolism. She is on heparin infusion. She seems to be improving slowly.  I would keep her on heparin through the weekend. As an outpatient, she will need Lovenox. I think this would be the best way to try to continue to cause progression of the thromboembolisms that she has.  We will have to monitor her blood counts daily. I would think that her platelet count should continue to improve.  I know that she is getting fantastic care from all the staff up on 4 E.  [name], MD  Romans 5:3-5

## 2020-07-21 NOTE — Progress Notes (Signed)
ANTICOAGULATION CONSULT NOTE - Follow Up Consult  Pharmacy Consult for heparin Indication: acute bilateral LE DVT and bilateral PE  Allergies  Allergen Reactions  . Tylenol [Acetaminophen] Other (See Comments)    States "seems like eats my stomach"  . Sulfa Antibiotics Other (See Comments)    Unknown allergic reaction   Patient Measurements:  Weight 109 kg charted on 05/28/20, height 61 inches Height: 5\' 1"  (154.9 cm) Weight: 82.5 kg (181 lb 14.1 oz) IBW/kg (Calculated) : 47.8 Heparin Dosing Weight: 75 kg  Vital Signs: Temp: 98 F (36.7 C) (08/28 0600) Temp Source: Oral (08/28 0600) BP: 113/76 (08/28 0600) Pulse Rate: 102 (08/28 0600)  Labs: Recent Labs    07/19/20 1202 07/19/20 1340 07/19/20 2321 07/20/20 0629 07/20/20 1002 07/20/20 1517 07/20/20 2348 07/21/20 0700  HGB 10.7*   < >  --  10.1*  --   --   --  9.7*  HCT 33.8*  --   --  32.5*  --   --   --  31.5*  PLT 73*  --   --  85*  --   --   --  106*  HEPARINUNFRC  --    < >   < >  --  0.77*  --  0.56 0.44  CREATININE 0.83  --   --  0.69  --   --   --  0.89  TROPONINIHS 215*   < >  --  111*  --  100*  --  65*   < > = values in this interval not displayed.   Estimated Creatinine Clearance: 57.3 mL/min (by C-G formula based on SCr of 0.89 mg/dL).  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 CTA chest scan revealed large PE affecting both lungs, including a saddle embolus with noted right heart strain. Lower extremity doppler on 8/26 came back positive for bilateral DVTs. Pt is currently on heparin drip for acute VTEs.   07/21/20 - Heparin level  therapeutic at 0.44 - CBC: platelets are low, but appears to be low with prior visits, pt also on chemotherapy, Hgb stable - No bleeding documented - SCr:  0.89  Goal of Therapy:  Heparin level 0.3-0.7 units/ml Monitor platelets by anticoagulation protocol: Yes   Plan:  Continue heparin rate at 1,000 units/hour Oncology rec transition to Lovenox at discharge Daily CBC and Heparin level Monitor CBC, s/s bleed  Minda Ditto PharmD 07/21/2020, 9:25 AM

## 2020-07-22 LAB — CBC
HCT: 30.4 % — ABNORMAL LOW (ref 36.0–46.0)
Hemoglobin: 9.5 g/dL — ABNORMAL LOW (ref 12.0–15.0)
MCH: 32 pg (ref 26.0–34.0)
MCHC: 31.3 g/dL (ref 30.0–36.0)
MCV: 102.4 fL — ABNORMAL HIGH (ref 80.0–100.0)
Platelets: 116 10*3/uL — ABNORMAL LOW (ref 150–400)
RBC: 2.97 MIL/uL — ABNORMAL LOW (ref 3.87–5.11)
RDW: 17.4 % — ABNORMAL HIGH (ref 11.5–15.5)
WBC: 8.4 10*3/uL (ref 4.0–10.5)
nRBC: 1.7 % — ABNORMAL HIGH (ref 0.0–0.2)

## 2020-07-22 LAB — HEPARIN LEVEL (UNFRACTIONATED): Heparin Unfractionated: 0.34 IU/mL (ref 0.30–0.70)

## 2020-07-22 LAB — BRAIN NATRIURETIC PEPTIDE: B Natriuretic Peptide: 1402.1 pg/mL — ABNORMAL HIGH (ref 0.0–100.0)

## 2020-07-22 MED ORDER — HEPARIN (PORCINE) 25000 UT/250ML-% IV SOLN
1150.0000 [IU]/h | INTRAVENOUS | Status: DC
  Administered 2020-07-22 – 2020-07-23 (×2): 1150 [IU]/h via INTRAVENOUS
  Filled 2020-07-22 (×2): qty 250

## 2020-07-22 NOTE — Progress Notes (Signed)
PROGRESS NOTE   Journey [facility]onguay [facility]  YJE:[id number]    DOB: June 22, 1951    DOA: 07/19/2020  PCP: Lucianne Lei, MD   I have briefly reviewed patients previous medical records in [facility].  Chief Complaint  Patient presents with  . Diarrhea  . Fall  . Knee Pain    left    Brief Narrative:  69 year old female with PMH of diffuse large B cell lymphoma being treated at [facility], pulmonary embolism in 2017 treated with ~6 months of Xarelto, prediabetes, HTN, HLD and endometriosis, presented to the [facility] on 8/26 with reports of diarrhea, lightheadedness and a fall.  CTA chest in ED showed large volume bilateral pulmonary embolism, including a saddle embolus, RV strain with ratio >3, initiated on IV heparin and CCM admitted the patient to the hospital.  Care transferred to [facility] on 8/28.  Hematology consulting.   Assessment & Plan:  Active Problems:   Pulmonary embolism (HCC)   Acute submassive pulmonary embolism with right heart strain, recurrent/acute left femoral and popliteal vein DVT/chronic right popliteal vein DVT: CTA chest 8/26 showed large volume pulmonary embolus bilaterally including a saddle embolus with RV strain.  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  2D echo 8/26: LVEF 63-78%, grade 1 diastolic dysfunction, positive McConnell sign with severe hypokinesia of the mid free wall with sparing/hypercontractile apex, severely reduced RV systolic function.  Troponin peaked to 218 but has gradually reduced to 65.  Troponin elevation likely secondary to demand ischemia.  As per PCCM/hematology input, not a candidate for lytic treatment but only as last resort, continue IV heparin x5 days and then hematology recommends transitioning to Lovenox.  Hemodynamically stable and not hypoxic.  Initiated IV heparin on 8/26, continued through 8/30 and transition to Lovenox on 8/31 at time of discharge.  Start gentle and gradual  mobilization.  Macrocytic anemia/anemia of chronic disease and malignancy Hemoglobin stable in the 9-10 g range.  Follow daily CBCs while on IV heparin.  Stable.  Thrombocytopenia chronic May be related to her lymphoma or its treatment.  Presented with platelet count of 73 which is improved to 106.  Follow daily CBCs.  Counts improving up to 116 today.  Essential hypertension: Controlled.  Holding home Lasix, labetalol due to submassive PE to avoid hypotension/hemodynamic compromise.  Dyslipidemia  Chronic pain: Continue home oxycodone.  Controlled.  Large B-cell lymphoma: Outpatient follow-up with [facility].  Chronic constipation, diarrhea s/p laxative use Seems to have resolved.  Compression fracture of T12: No back pain reported today.  Body mass index is 35.28 kg/m.   GOC: PCCM had this discussion with her on 8/27 at which point she stated that she would be accepting of short-term support but would not "want to live on machines".   DVT prophylaxis: IV heparin ongoing Code Status: Full Family Communication: Discussed in detail with patient's son on 8/28, updated care and answered questions.  None at bedside. Disposition:  Status is: Inpatient  Remains inpatient appropriate because:Inpatient level of care appropriate due to severity of illness   Dispo: The patient is from: Home              Anticipated d/c is to: Home              Anticipated d/c date is: 2 days.              Patient currently is not medically stable to d/c.  Ongoing IV heparin  for submassive PE.        Consultants:   Hematology.  Procedures:   None  Antimicrobials:   None   Subjective:  Denies complaints.  No chest pain, dyspnea, dizziness or lightheadedness.  No bleeding reported.  Objective:   Vitals:   07/21/20 1631 07/21/20 1658 07/21/20 2026 07/22/20 0529  BP: 101/60  101/65 102/68  Pulse: (!) 101  100 95  Resp: (!) 22 20 18 18   Temp: 98.6 F (37 C)  98.2 F (36.8 C) 98.2 F  (36.8 C)  TempSrc: Oral  Oral   SpO2: 99%  94% 98%  Weight:    84.7 kg  Height:        General exam: Pleasant elderly female, moderately built and obese lying comfortably propped up in bed without distress. Respiratory system: Clear to auscultation. Respiratory effort normal.  Right upper anterior chest Port-A-Cath. Cardiovascular system: S1 & S2 heard, RRR. No JVD, murmurs, rubs, gallops or clicks.  Trace bilateral leg edema.  Telemetry personally reviewed: Sinus rhythm. Gastrointestinal system: Abdomen is nondistended, soft and nontender. No organomegaly or masses felt. Normal bowel sounds heard. Central nervous system: Alert and oriented. No focal neurological deficits. Extremities: Symmetric 5 x 5 power. Skin: No rashes, lesions or ulcers Psychiatry: Judgement and insight appear normal. Mood & affect appropriate.     Data Reviewed:   I have personally reviewed following labs and imaging studies   CBC: Recent Labs  Lab 07/19/20 1202 07/19/20 1202 07/20/20 0629 07/21/20 0700 07/22/20 0328  WBC 10.4   < > 8.7 9.5 8.4  NEUTROABS 7.4  --   --   --   --   HGB 10.7*   < > 10.1* 9.7* 9.5*  HCT 33.8*   < > 32.5* 31.5* 30.4*  MCV 100.6*   < > 100.6* 101.6* 102.4*  PLT 73*   < > 85* 106* 116*   < > = values in this interval not displayed.    Basic Metabolic Panel: Recent Labs  Lab 07/19/20 1202 07/20/20 0629 07/21/20 0700  NA 141 138 138  K 4.1 4.2 4.6  CL 109 109 106  CO2 21* 20* 20*  GLUCOSE 137* 105* 105*  BUN 14 12 12   CREATININE 0.83 0.69 0.89  CALCIUM 9.1 8.6* 8.7*  MG  --  2.0 2.0  PHOS  --  3.5  --     Liver Function Tests: Recent Labs  Lab 07/19/20 1202  AST 24  ALT 15  ALKPHOS 86  BILITOT 1.1  PROT 5.7*  ALBUMIN 3.0*    CBG: No results for input(s): GLUCAP in the last 168 hours.  Microbiology Studies:   Recent Results (from the past 240 hour(s))  SARS Coronavirus 2 by RT PCR (hospital order, performed in [facility] hospital lab)  Nasopharyngeal Nasopharyngeal Swab     Status: None   Collection Time: 07/19/20  1:40 PM   Specimen: Nasopharyngeal Swab  Result Value Ref Range Status   SARS Coronavirus 2 NEGATIVE NEGATIVE Final    Comment: (NOTE) SARS-CoV-2 target nucleic acids are NOT DETECTED.  The SARS-CoV-2 RNA is generally detectable in upper and lower respiratory specimens during the acute phase of infection. The lowest concentration of SARS-CoV-2 viral copies this assay can detect is 250 copies / mL. A negative result does not preclude SARS-CoV-2 infection and should not be used as the sole basis for treatment or other patient management decisions.  A negative result may occur with improper specimen collection / handling, submission  of specimen other than nasopharyngeal swab, presence of viral mutation(s) within the areas targeted by this assay, and inadequate number of viral copies (<250 copies / mL). A negative result must be combined with clinical observations, patient history, and epidemiological information.  Fact Sheet for Patients:   StrictlyIdeas.no  Fact Sheet for Healthcare Providers: BankingDealers.co.za  This test is not yet approved or  cleared by the [place] FDA and has been authorized for detection and/or diagnosis of SARS-CoV-2 by FDA under an Emergency Use Authorization (EUA).  This EUA will remain in effect (meaning this test can be used) for the duration of the COVID-19 declaration under Section 564(b)(1) of the Act, 21 U.S.C. section 360bbb-3(b)(1), unless the authorization is terminated or revoked sooner.  Performed at [facility] [place] [address] [place], [place] [address]      Radiology Studies:  DG CHEST PORT 1 VIEW  Result Date: 07/21/2020 CLINICAL DATA:  Pulmonary embolism EXAM: PORTABLE CHEST 1 VIEW COMPARISON:  07/20/2020 FINDINGS: There is a right chest wall port a catheter with tip in the  cavoatrial junction. Stable cardiomediastinal contours. No pleural effusion or edema. No airspace densities identified. Degenerative type change noted within both glenohumeral joints and both AC joints. IMPRESSION: No active cardiopulmonary abnormalities. Electronically Signed   By: Kerby Moors M.D.   On: 07/21/2020 06:35     Scheduled Meds:   . atorvastatin  10 mg Oral QPM  . Chlorhexidine Gluconate Cloth  6 each Topical Daily  . gabapentin  300 mg Oral TID  . medroxyPROGESTERone  10 mg Oral Daily  . ondansetron  4 mg Oral BID  . oxyCODONE-acetaminophen  1 tablet Oral QID  . pantoprazole  40 mg Oral Daily  . potassium chloride  10 mEq Oral BID  . prochlorperazine  5 mg Oral Q12H  . sodium chloride flush  10-40 mL Intracatheter Q12H  . sucralfate  1 g Oral TID AC    Continuous Infusions:   . heparin 1,150 Units/hr (07/22/20 1005)     LOS: 3 days     Vernell Leep, MD, [place], [facility]. [facility]    To contact the attending provider between 7A-7P or the covering provider during after hours 7P-7A, please log into the web site [email or web address] and access using universal [place] password for that web site. If you do not have the password, please call the hospital operator.  07/22/2020, 1:16 PM

## 2020-07-22 NOTE — Progress Notes (Signed)
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  She is very alert this morning.  She had a good day yesterday.  She has had no problems with cough.  No shortness of breath.  No chest wall pain.  She has had no bleeding.  She is on a heparin infusion.  Hopefully, she will be able to get out of bed today.  Hopefully to be able to move around a little bit more.  I really think she could be able to move around.  Her beta natruretic peptide is still elevated at 1400.  Her white cell count is 8.4.  Hemoglobin 9.5.  Platelet count 116,000.  Her vital signs show temperature of 98.2.  Pulse 95.  Blood pressure 102/68.  Her lungs sound clear bilaterally.  Cardiac exam regular rate and rhythm.  She has no murmurs, rubs or bruits.  Abdomen is soft.  She has good bowel sounds.  There is no fluid wave.  Extremities shows no clubbing, cyanosis or edema.  Neurological exam is nonfocal.  [name] has a extensive some massive pulmonary embolism.  She had right heart strain.,  Sure of the elevated beta natruretic peptide is a measure of the right heart strain.  I think she is responding to the heparin infusion.  I would keep her on heparin infusion through Monday and then switch her over to Lovenox.  I do not know if she will need another echocardiogram to evaluate her right heart function.  As always, we had a very good prayer at the end of our meeting.  Her faith remains incredibly strong.  She is truly a vessel for healing.  She puts all of her faith in God.  [name], MD  Psalm 91:16

## 2020-07-22 NOTE — Progress Notes (Signed)
ANTICOAGULATION CONSULT NOTE - Follow Up Consult  Pharmacy Consult for heparin Indication: acute bilateral LE DVT and bilateral PE  Allergies  Allergen Reactions  . Tylenol [Acetaminophen] Other (See Comments)    States "seems like eats my stomach"  . Sulfa Antibiotics Other (See Comments)    Unknown allergic reaction   Patient Measurements:  Weight 109 kg charted on 05/28/20, height 61 inches Height: 5\' 1"  (154.9 cm) Weight: 84.7 kg (186 lb 11.7 oz) IBW/kg (Calculated) : 47.8 Heparin Dosing Weight: 75 kg  Vital Signs: Temp: 98.2 F (36.8 C) (08/29 0529) Temp Source: Oral (08/28 2026) BP: 102/68 (08/29 0529) Pulse Rate: 95 (08/29 0529)  Labs: Recent Labs    07/19/20 1202 07/19/20 1340 07/20/20 0629 07/20/20 0629 07/20/20 1002 07/20/20 1517 07/20/20 2348 07/21/20 0700 07/22/20 0328  HGB 10.7*   < > 10.1*   < >  --   --   --  9.7* 9.5*  HCT 33.8*   < > 32.5*  --   --   --   --  31.5* 30.4*  PLT 73*   < > 85*  --   --   --   --  106* 116*  HEPARINUNFRC  --    < >  --   --    < >  --  0.56 0.44 0.34  CREATININE 0.83  --  0.69  --   --   --   --  0.89  --   TROPONINIHS 215*   < > 111*  --   --  100*  --  65*  --    < > = values in this interval not displayed.   Estimated Creatinine Clearance: 58.1 mL/min (by C-G formula based on SCr of 0.89 mg/dL).  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 CTA chest scan revealed large PE affecting both lungs, including a saddle embolus with noted right heart strain. Lower extremity doppler on 8/26 came back positive for bilateral DVTs. Pt is currently on heparin drip for acute VTEs.   07/22/20 - Heparin level  therapeutic at 0.34 - CBC: platelets low - but increasing, on chemotherapy, Hgb low- stable - No bleeding  documented - SCr: 0.89  Goal of Therapy:  Heparin level 0.3-0.7 units/ml Monitor platelets by anticoagulation protocol: Yes   Plan:  Increase Heparin infusion to 1150 units/hr Oncology rec transition to Lovenox at discharge Daily CBC and Heparin level Monitor CBC, s/s bleed  Minda Ditto PharmD 07/22/2020, 8:11 AM

## 2020-07-23 DIAGNOSIS — I4891 Unspecified atrial fibrillation: Secondary | ICD-10-CM

## 2020-07-23 DIAGNOSIS — I480 Paroxysmal atrial fibrillation: Secondary | ICD-10-CM

## 2020-07-23 LAB — HEPARIN LEVEL (UNFRACTIONATED): Heparin Unfractionated: 0.49 IU/mL (ref 0.30–0.70)

## 2020-07-23 LAB — CBC
HCT: 32.1 % — ABNORMAL LOW (ref 36.0–46.0)
Hemoglobin: 9.7 g/dL — ABNORMAL LOW (ref 12.0–15.0)
MCH: 31.2 pg (ref 26.0–34.0)
MCHC: 30.2 g/dL (ref 30.0–36.0)
MCV: 103.2 fL — ABNORMAL HIGH (ref 80.0–100.0)
Platelets: 122 10*3/uL — ABNORMAL LOW (ref 150–400)
RBC: 3.11 MIL/uL — ABNORMAL LOW (ref 3.87–5.11)
RDW: 18 % — ABNORMAL HIGH (ref 11.5–15.5)
WBC: 8.2 10*3/uL (ref 4.0–10.5)
nRBC: 1.7 % — ABNORMAL HIGH (ref 0.0–0.2)

## 2020-07-23 MED ORDER — SODIUM CHLORIDE 0.9 % IV BOLUS
500.0000 mL | Freq: Once | INTRAVENOUS | Status: AC
  Administered 2020-07-23: 500 mL via INTRAVENOUS

## 2020-07-23 NOTE — TOC Initial Note (Signed)
Transition of Care [facility]) - Initial/Assessment Note    Patient Details  Name: Lauren Santana MRN: [id number] Date of Birth: November 11, 1951  Transition of Care [facility]) CM/SW Contact:    Shade Flood, LCSW Phone Number: 07/23/2020, 2:03 PM  Clinical Narrative:                  Pt admitted from home. PT recommending SNF rehab. Pt and son agreeable. Pt has been at [facility] recently. Discussed CMS SNF options with son and will refer as requested.  Started insurance auth with [facility]. TOC will follow.  Expected Discharge Plan: Skilled Nursing Facility Barriers to Discharge: Continued Medical Work up   Patient Goals and CMS Choice Patient states their goals for this hospitalization and ongoing recovery are:: get better CMS Medicare.gov Compare Post Acute Care list provided to:: Patient Represenative (must comment) Choice offered to / list presented to : Adult Children  Expected Discharge Plan and Services Expected Discharge Plan: [place] In-house Referral: Clinical Social Work   Post Acute Care Choice: [place] Living arrangements for the past 2 months: Apartment                                      Prior Living Arrangements/Services Living arrangements for the past 2 months: Apartment Lives with:: Self Patient language and need for interpreter reviewed:: Yes Do you feel safe going back to the place where you live?: Yes      Need for Family Participation in Patient Care: No (Comment) Care giver support system in place?: Yes (comment)   Criminal Activity/Legal Involvement Pertinent to Current Situation/Hospitalization: No - Comment as needed  Activities of Daily Living Home Assistive Devices/Equipment: Eyeglasses, Gilford Rile (specify type) (front wheeled walker) ADL Screening (condition at time of admission) Patient's cognitive ability adequate to safely complete daily activities?: Yes Is the patient deaf or have difficulty  hearing?: No Does the patient have difficulty seeing, even when wearing glasses/contacts?: No Does the patient have difficulty concentrating, remembering, or making decisions?: No Patient able to express need for assistance with ADLs?: Yes Does the patient have difficulty dressing or bathing?: No Independently performs ADLs?: Yes (appropriate for developmental age) Does the patient have difficulty walking or climbing stairs?: Yes (secondary to weakness) Weakness of Legs: Both Weakness of Arms/Hands: None  Permission Sought/Granted Permission sought to share information with : Chartered certified accountant granted to share information with : Yes, Verbal Permission Granted     Permission granted to share info w AGENCY: local snfs        Emotional Assessment       Orientation: : Oriented to Self, Oriented to Place, Oriented to  Time, Oriented to Situation Alcohol / Substance Use: Not Applicable Psych Involvement: No (comment)  Admission diagnosis:  Pulmonary embolism (HCC) [I26.99] Elevated troponin [R77.8] Acute saddle pulmonary embolism with acute cor pulmonale (HCC) [I26.02] Diarrhea, unspecified type [R19.7] Patient Active Problem List   Diagnosis Date Noted  . Pulmonary embolism ([place]) 07/19/2020  . 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 S/P BSO (bilateral salpingo-oophorectomy) 01/08/2017  . S/P hysterectomy 01/08/2017  . Tachycardia   . Acute deep vein thrombosis (DVT) of right lower extremity ([place]) 02/14/2016  . Pulmonary emboli ([place]) 02/13/2016  . PE (pulmonary embolism) 02/13/2016  . AKI (acute kidney injury) ([place]) 02/13/2016  . Pulmonary embolism with acute cor pulmonale ([place])   . Pelvic  mass in female   . Morbid obesity with BMI of 40.0-44.9, adult ([place]) 08/20/2015  . 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 Essential hypertension, benign 07/23/2014  . Edema 07/23/2014  . Dyslipidemia 07/23/2014  . Lower extremity numbness  07/23/2014  . 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 Postmenopausal bleeding 03/10/2012  . Endometriosis of pelvis 01/22/2007   PCP:  Lucianne Lei, MD Pharmacy:   [id number] - [place], [place] [address] [place] [address] Phone: [phone] Fax: [phone]     Social Determinants of Health (SDOH) Interventions    Readmission Risk Interventions Readmission Risk Prevention Plan 07/23/2020  Transportation Screening Complete  Medication Review (RN CM) Complete  Some recent data might be hidden

## 2020-07-23 NOTE — Evaluation (Signed)
Occupational Therapy Evaluation Patient Details Name: Lauren Santana MRN: [id number] DOB: 03-28-1951 Today's Date: 07/23/2020    History of Present Illness 69 yo female admitted with PE, DVT. Hx of lymphoma, L TKA 2015, PE, T10/T11 fractures   Clinical Impression   Pt admitted with PE. Pt currently with functional limitations due to the deficits listed below (see OT Problem List).  Pt will benefit from skilled OT to increase their safety and independence with ADL and functional mobility for ADL to facilitate discharge to venue listed below.      Follow Up Recommendations  Home health OT;SNF (depending on progress)    Equipment Recommendations  None recommended by OT       Precautions / Restrictions Precautions Precautions: Fall Precaution Comments: monitor HR      Mobility Bed Mobility Overal bed mobility: Needs Assistance Bed Mobility: Supine to Sit     Supine to sit: [facility] elevated;Min assist     General bed mobility comments: Increased time.  Transfers Overall transfer level: Needs assistance Equipment used: Rolling walker (2 wheeled) Transfers: Sit to/from [facility] Sit to Stand: Min assist;From elevated surface Stand pivot transfers: Min assist       General transfer comment: Assist to rise, stabilize, control descent. VCs safety, technique, hand placement. Increased time.    Balance Overall balance assessment: Needs assistance         Standing balance support: Bilateral upper extremity supported Standing balance-Leahy Scale: Poor                             ADL either performed or assessed with clinical judgement   ADL Overall ADL's : Needs assistance/impaired Eating/Feeding: Set up;Sitting   Grooming: Set up;Sitting   Upper Body Bathing: Minimal assistance;Sitting   Lower Body Bathing: Moderate assistance;Sit to/from stand;Cueing for safety;Cueing for sequencing   Upper Body Dressing : Minimal  assistance;Sitting   Lower Body Dressing: Moderate assistance;Sit to/from stand;Cueing for safety;Cueing for sequencing   Toilet Transfer: Moderate assistance;RW;Cueing for sequencing;Cueing for safety;BSC   Toileting- Clothing Manipulation and Hygiene: Moderate assistance;Sit to/from stand       Functional mobility during ADLs: Moderate assistance;Rolling walker General ADL Comments: Spoke with pt and sonregarding need for increased A at home.     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)     Prior Functioning/Environment  pt had been at rehab at [facility] but had reached mod I level prior to this admission                OT Problem List: Decreased strength;Decreased activity tolerance;Impaired balance (sitting and/or standing)      OT Treatment/Interventions: Self-care/ADL training;Patient/family education;DME and/or AE instruction    OT Goals(Current goals can be found in the care plan section) Acute Rehab OT Goals Patient Stated Goal: be able to go home OT Goal Formulation: With patient Time For Goal Achievement: 08/07/20  OT Frequency: Min 2X/week   Barriers to D/C: Decreased caregiver support  AM-PAC OT "6 Clicks" Daily Activity     Outcome Measure Help from another person eating meals?: None Help from another person taking care of personal grooming?: None Help from another person toileting, which includes using toliet, bedpan, or urinal?: A Little Help from another person bathing (including washing, rinsing, drying)?: A Lot Help from another person to put on and taking off regular upper body clothing?: A Little Help from another person to put on and  taking off regular lower body clothing?: A Lot 6 Click Score: 18   End of Session Equipment Utilized During Treatment: Rolling walker Nurse Communication: Mobility status  Activity Tolerance: Patient tolerated treatment well Patient left: in chair;with family/visitor present  OT Visit Diagnosis: Unsteadiness on feet (R26.81);Repeated falls (R29.6)                Time: 5462-7035 OT Time Calculation (min): 24 min Charges:  OT General Charges $OT Visit: 1 Visit OT Evaluation $OT Eval Moderate Complexity: 1 Mod OT Treatments $Self Care/Home Management : 8-22 mins  [name], OT Acute Rehabilitation Services [phone] Office- [phone], Thereasa Parkin 07/23/2020, 6:49 PM

## 2020-07-23 NOTE — Plan of Care (Signed)
  Problem: Education: Goal: Knowledge of General Education information will improve Description: Including pain rating scale, medication(s)/side effects and non-pharmacologic comfort measures Outcome: Progressing   Problem: Health Behavior/Discharge Planning: Goal: Ability to manage health-related needs will improve Outcome: Progressing   Problem: Activity: Goal: Risk for activity intolerance will decrease Outcome: Progressing   Problem: Coping: Goal: Level of anxiety will decrease Outcome: Progressing   Problem: Elimination: Goal: Will not experience complications related to bowel motility Outcome: Progressing Goal: Will not experience complications related to urinary retention Outcome: Progressing   Problem: Pain Managment: Goal: General experience of comfort will improve Outcome: Progressing

## 2020-07-23 NOTE — Progress Notes (Signed)
Lauren Santana is doing quite nicely.  She is having no problems with chest pain.  There is no cough or shortness of breath.  She has had no bleeding.  She is on the heparin infusion.  I will keep on heparin fusion through today and then switch over to twice daily dosing of Lovenox.  She needs to get out of bed now.  I do not see any problems with her walking.  It will be interesting to see how she does with walking.  She seems to be eating okay.  She has had no nausea or vomiting.  There is no diarrhea.  She has had no fever.  Her labs today show a white cell count of 8.2.  Hemoglobin 9.7.  Platelet count 122,000.  Her vital signs show temperature 97.6.  Pulse 92.  Blood pressure 112/72.  Oxygen saturation 100%.  Her lungs are clear bilaterally.  I do not hear any friction rub.  There is no wheezing.  Cardiac exam regular rate and rhythm.  She has no murmurs, rubs or bruits.  Abdomen is soft.  She has good bowel sounds.  There is no fluid wave.  There is no liver or spleen tip that is palpable.  Extremities shows no clubbing, cyanosis or edema.  Neurological exam shows no focal neurological deficits.  Ms. Canny has a large bilateral pulmonary embolism.  She had a saddle embolus.  She is on heparin infusion.  This has to be helping to dissolve the thrombus given her improvement in cardiopulmonary status.  I am still not sure why the beta natruretic protein is so elevated.  Again she is able to get out of bed now.  I think this would be very nice to see how she does.  Hopefully, she will be able to go home by Wednesday.  We need to make sure that she does okay with the Lovenox and knows how this can be administered at home.  I appreciate the great care that she is getting from all the staff upon 4 [address].  [name], MD  2 Corinthians 5:7

## 2020-07-23 NOTE — Evaluation (Signed)
Physical Therapy Evaluation Patient Details Name: Lauren Santana MRN: [id number] DOB: [birth year] Today's Date: 07/23/2020   History of Present Illness  69 yo female admitted with PE, DVT. Hx of lymphoma, L TKA 2015, PE, T10/T11 fractures  Clinical Impression  On eval, pt required Min assist +2 for amb safety. Pt only able to walk ~15 feet with a RW. Distance limited by dyspnea and fatigue. HR up to mid 140s with activity. Assisted pt into recliner at end of session. Will continue to follow and progress activity as tolerated. Will plan to follow and progress activity as tolerated. Feel pt may need ST rehab at SNF unless family feel they can provide current level of assistance.     Follow Up Recommendations SNF (HHPT and 24 hour supervision/assist if pt and/or family decline placement)    Equipment Recommendations  None recommended by PT    Recommendations for Other Services OT consult     Precautions / Restrictions Precautions Precautions: Fall Precaution Comments: monitor HR Restrictions Weight Bearing Restrictions: No      Mobility  Bed Mobility Overal bed mobility: Needs Assistance Bed Mobility: Supine to Sit     Supine to sit: Min guard;HOB elevated     General bed mobility comments: Increased time.  Transfers Overall transfer level: Needs assistance Equipment used: Rolling walker (2 wheeled) Transfers: Sit to/from Stand Sit to Stand: Min assist;From elevated surface         General transfer comment: Assist to rise, stabilize, control descent. VCs safety, technique, hand placement. Increased time.  Ambulation/Gait Ambulation/Gait assistance: Min assist;+2 safety/equipment Gait Distance (Feet): 15 Feet Assistive device: Rolling walker (2 wheeled) Gait Pattern/deviations: Step-through pattern;Decreased stride length;Trunk flexed     General Gait Details: Pt maintains flexed trunk posture with forearms resting on RW handles. Slow gait speed. Assist to  stabilize pt and maneuver RW. HR up to mid 140s bpm, O2 88% on RA, dyspnea 3/4.  Stairs            Wheelchair Mobility    Modified Rankin (Stroke Patients Only)       Balance Overall balance assessment: Needs assistance         Standing balance support: Bilateral upper extremity supported Standing balance-Leahy Scale: Poor                               Pertinent Vitals/Pain Pain Assessment: No/denies pain    Home Living Family/patient expects to be discharged to:: Unsure Living Arrangements: Children Available Help at Discharge: Family;Available PRN/intermittently Type of Home: Apartment Home Access: Level entry     Home Layout: One level Home Equipment: Walker - 4 wheels;Cane - single point;Grab bars - tub/shower;Tub bench;Hand held shower head;Bedside commode      Prior Function Level of Independence: Needs assistance   Gait / Transfers Assistance Needed: using rollator for ambulation  ADL's / Homemaking Assistance Needed: assist with bathing, dressing        Hand Dominance        Extremity/Trunk Assessment   Upper Extremity Assessment Upper Extremity Assessment: Generalized weakness    Lower Extremity Assessment Lower Extremity Assessment: Generalized weakness    Cervical / Trunk Assessment Cervical / Trunk Assessment: Kyphotic  Communication   Communication: No difficulties  Cognition Arousal/Alertness: Awake/alert Behavior During Therapy: WFL for tasks assessed/performed Overall Cognitive Status: Within Functional Limits for tasks assessed  General Comments      Exercises     Assessment/Plan    PT Assessment Patient needs continued PT services  PT Problem List Decreased strength;Decreased mobility;Decreased activity tolerance;Decreased balance;Impaired sensation       PT Treatment Interventions DME instruction;Gait training;Therapeutic activities;Therapeutic  exercise;Patient/family education;Balance training;Functional mobility training    PT Goals (Current goals can be found in the Care Plan section)  Acute Rehab PT Goals Patient Stated Goal: none stated PT Goal Formulation: With patient Time For Goal Achievement: 08/06/20 Potential to Achieve Goals: Good    Frequency Min 3X/week   Barriers to discharge        Co-evaluation               AM-PAC PT "6 Clicks" Mobility  Outcome Measure Help needed turning from your back to your side while in a flat bed without using bedrails?: A Little Help needed moving from lying on your back to sitting on the side of a flat bed without using bedrails?: A Little Help needed moving to and from a bed to a chair (including a wheelchair)?: A Little Help needed standing up from a chair using your arms (e.g., wheelchair or bedside chair)?: A Little Help needed to walk in hospital room?: A Lot Help needed climbing 3-5 steps with a railing? : A Lot 6 Click Score: 16    End of Session Equipment Utilized During Treatment: Gait belt;Oxygen Activity Tolerance: Patient limited by fatigue (limited by tachycardia) Patient left: in chair;with call bell/phone within reach;with chair alarm set   PT Visit Diagnosis: Muscle weakness (generalized) (M62.81);Difficulty in walking, not elsewhere classified (R26.2)    Time: 4503-8882 PT Time Calculation (min) (ACUTE ONLY): 21 min   Charges:   PT Evaluation $PT Eval Low Complexity: [place], PT Acute Rehabilitation  Office: [phone] Pager: [phone]

## 2020-07-23 NOTE — NC FL2 (Signed)
[place] LEVEL OF CARE SCREENING TOOL     IDENTIFICATION  Patient Name: Lauren Santana Birthdate: 12/06/1950 Sex: female Admission Date (Current Location): 07/19/2020  [facility] and [place] Number:  Herbalist and Address:  [facility] [place],  [place] [address] [place]      Provider Number: [id number]  Attending Physician Name and Address:  Modena Jansky, MD  Relative Name and Phone Number:       Current Level of Care: Hospital Recommended Level of Care: [place] Prior Approval Number:    Date Approved/Denied:   PASRR Number: [id number]  Discharge Plan: SNF    Current Diagnoses: Patient Active Problem List   Diagnosis Date Noted  . Pulmonary embolism ([place]) 07/19/2020  . 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 S/P BSO (bilateral salpingo-oophorectomy) 01/08/2017  . S/P hysterectomy 01/08/2017  . Tachycardia   . Acute deep vein thrombosis (DVT) of right lower extremity ([place]) 02/14/2016  . Pulmonary emboli ([place]) 02/13/2016  . PE (pulmonary embolism) 02/13/2016  . AKI (acute kidney injury) ([place]) 02/13/2016  . Pulmonary embolism with acute cor pulmonale ([place])   . Pelvic mass in female   . Morbid obesity with BMI of 40.0-44.9, adult ([place]) 08/20/2015  . 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 Essential hypertension, benign 07/23/2014  . Edema 07/23/2014  . Dyslipidemia 07/23/2014  . Lower extremity numbness 07/23/2014  . 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 Postmenopausal bleeding 03/10/2012  . Endometriosis of pelvis 01/22/2007    Orientation RESPIRATION BLADDER Height & Weight     Self, Time, Situation, Place  Normal Continent Weight: 185 lb 6.5 oz (84.1 kg) Height:  5\' 1"  (154.9 cm)  BEHAVIORAL SYMPTOMS/MOOD NEUROLOGICAL BOWEL NUTRITION STATUS      Continent Diet (see dc summary)  AMBULATORY STATUS COMMUNICATION OF NEEDS Skin   Extensive Assist Verbally Normal                        Personal Care Assistance Level of Assistance  Bathing, Dressing, Feeding Bathing Assistance: Limited assistance Feeding assistance: Independent Dressing Assistance: Limited assistance     Functional Limitations Info  Sight, Hearing, Speech Sight Info: Adequate Hearing Info: Adequate Speech Info: Adequate    SPECIAL CARE FACTORS FREQUENCY  PT (By licensed PT), OT (By licensed OT)     PT Frequency: 5x week OT Frequency: 3x week            Contractures Contractures Info: Not present    Additional Factors Info  Code Status, Allergies Code Status Info: Full Allergies Info: Tylenol, Sulfa Antibiotics           Current Medications (07/23/2020):  This is the current hospital active medication list Current Facility-Administered Medications  Medication Dose Route Frequency Provider Last Rate Last Admin  . atorvastatin (LIPITOR) tablet 10 mg  10 mg Oral QPM Ollis, Brandi L, NP   10 mg at 07/22/20 1806  . Chlorhexidine Gluconate Cloth 2 % PADS 6 each  6 each Topical Daily Rise Patience, MD   6 each at 07/23/20 1005  . docusate sodium (COLACE) capsule 100 mg  100 mg Oral BID PRN Noe Gens L, NP   100 mg at 07/21/20 2113  . gabapentin (NEURONTIN) capsule 300 mg  300 mg Oral TID Noe Gens L, NP   300 mg at 07/23/20 1003  . heparin ADULT infusion 100 units/mL (25000 units/225mL sodium chloride 0.45%)  1,150 Units/hr  Intravenous Continuous Minda Ditto, RPH 11.5 mL/hr at 07/23/20 1001 1,150 Units/hr at 07/23/20 1001  . medroxyPROGESTERone (PROVERA) tablet 10 mg  10 mg Oral Daily Magdalen Spatz, NP   10 mg at 07/23/20 1003  . ondansetron (ZOFRAN) tablet 4 mg  4 mg Oral BID Ollis, Brandi L, NP   4 mg at 07/23/20 1003  . oxyCODONE-acetaminophen (PERCOCET) 7.5-325 MG per tablet 1 tablet  1 tablet Oral QID Noe Gens L, NP   1 tablet at 07/23/20 1003  . pantoprazole (PROTONIX) EC tablet 40 mg  40 mg Oral Daily Ollis, Brandi L, NP   40 mg at 07/23/20 1002  .  polyethylene glycol (MIRALAX / GLYCOLAX) packet 17 g  17 g Oral Daily PRN Ollis, Brandi L, NP      . potassium chloride (KLOR-CON) CR tablet 10 mEq  10 mEq Oral BID Ollis, Brandi L, NP   10 mEq at 07/23/20 1003  . prochlorperazine (COMPAZINE) tablet 5 mg  5 mg Oral Q12H Ollis, Brandi L, NP   5 mg at 07/23/20 1002  . sodium chloride flush (NS) 0.9 % injection 10-40 mL  10-40 mL Intracatheter Q12H Hongalgi, Lenis Dickinson, MD   10 mL at 07/22/20 2310  . sodium chloride flush (NS) 0.9 % injection 10-40 mL  10-40 mL Intracatheter PRN Hongalgi, Anand D, MD      . sucralfate (CARAFATE) 1 GM/10ML suspension 1 g  1 g Oral TID AC Ollis, Brandi L, NP   1 g at 07/23/20 1210     Discharge Medications: Please see discharge summary for a list of discharge medications.  Relevant Imaging Results:  Relevant Lab Results:   Additional Information SSN: [id number] LCSW

## 2020-07-23 NOTE — Progress Notes (Signed)
ANTICOAGULATION CONSULT NOTE - Follow Up Consult  Pharmacy Consult for heparin Indication: acute bilateral LE DVT and bilateral PE  Allergies  Allergen Reactions  . Tylenol [Acetaminophen] Other (See Comments)    States "seems like eats my stomach"  . Sulfa Antibiotics Other (See Comments)    Unknown allergic reaction   Patient Measurements:  Height: 5\' 1"  (154.9 cm) Weight: 84.7 kg (186 lb 11.7 oz) IBW/kg (Calculated) : 47.8 Heparin Dosing Weight: 67.2 kg  Vital Signs: Temp: 97.6 F (36.4 C) (08/30 0629) Temp Source: Oral (08/30 0629) BP: 112/72 (08/30 0629) Pulse Rate: 92 (08/30 0629)  Labs: Recent Labs    07/20/20 1517 07/20/20 2348 07/21/20 0700 07/21/20 0700 07/22/20 0328 07/23/20 0344  HGB  --   --  9.7*   < > 9.5* 9.7*  HCT  --   --  31.5*  --  30.4* 32.1*  PLT  --   --  106*  --  116* 122*  HEPARINUNFRC  --    < > 0.44  --  0.34 0.49  CREATININE  --   --  0.89  --   --   --   TROPONINIHS 100*  --  65*  --   --   --    < > = values in this interval not displayed.   Estimated Creatinine Clearance: 58.1 mL/min (by C-G formula based on SCr of 0.89 mg/dL).  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 CTA chest scan revealed large PE affecting both lungs, including a saddle embolus with noted right heart strain. Lower extremity doppler on 8/26 came back positive for bilateral DVTs. Pharmacy consulted for IV heparin dosing for acute VTE.   07/23/20 - Heparin level = 0.49 units/mL, therapeutic - CBC: Hgb low, but stable. Pltc low at 122K, but improving (on chemotherapy) - No bleeding or infusion issues noted per nursing  Goal of Therapy:  Heparin level 0.3-0.7 units/ml Monitor platelets by anticoagulation protocol: Yes   Plan:  Continue heparin infusion  at 1150 units/hr Oncology recommending transition to Lovenox at discharge Daily CBC and heparin level Monitor closely for s/sx of bleeding   Lindell Spar, PharmD, BCPS Clinical Pharmacist  07/23/2020, 6:42 AM

## 2020-07-23 NOTE — Consult Note (Addendum)
Cardiology Consultation:   Patient ID: Lauren Santana; [id number]; Apr 23, 1951   Admit date: 07/19/2020 Date of Consult: 07/23/2020  Primary Care Provider: Lucianne Lei, MD Primary Cardiologist: New to [facility]  Patient Profile:   Lauren Santana is a 69 y.o. 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 Algis Liming  History of Present Illness:   Lauren Santana is a 69 year old female with a history as stated above who presented to [facility] on 07/19/2020 with diarrhea, lightheadedness and a fall. She reports that she lives alone and was diagnosed with lymphoma approximately 3 months ago. She felt as of she was doing well until the day of presentation. She states that she had been constipated and took milk of magnesia on the evening of 07/18/2020 at which time she began having frequent bowel movements.  She was ambulating through her house when she began feeling weak and subsequently fell. She had no loss of consciousness. Given this, EMS was activated and the patient was found to be hypotensive with episodes of confusion.   On ED arrival, imaging was obtained which were negative for acute fractures.  CTA of chest showed large volume pulmonary embolism affecting both lungs indicating a saddle embolus with RV strain with a ratio >3.  IV heparin was initiated with IR consultation for potential catheter directed thrombolytics.  IR recommended given patients stable status, plan was to continue with conservative management with IV heparin. The patient is not a candidate for PE lysis or thrombectomy due to thrombocytopenia and high risk of bleeding  She denies chest pain, palpitations, SOB, new LE edema, dizziness or syncope.   Per chart review, patient  is currently undergoing chemotherapy at [facility] health for B-cell lymphoma  Echocardiogram performed 07/19/2020 showed EF at 70 to 75% with LV hyperdynamic function, no regional wall motion abnormalities, G1 DD, positive McConnell sign with severe hypokinesia of the mid free wall with sparing/hypercontractile apex, enlarged RV with severely reduced function  She was previously seen by cardiology 02/15/2016 in hospital consultation for tachycardia felt to be ectopic atrial tachycardia in the setting of pulmonary embolism with RA/RV strain.  Plan was to trial IV beta-blocker however prior to initial dose patient converted to normal sinus rhythm.  At that time, patient was on PTA labetalol.  No further treatment was recommended from cardiology standpoint prior to discharge.Echocardiogram performed which showed moderate LVH, an LVEF at 65% with mildly dilated RV.   Past Medical History:  Diagnosis Date  . Arthritis   . Edema    B/LLE  . Endometriosis   . High cholesterol   . Hypertension   . Numbness    feet  . Pre-diabetes   . Pulmonary embolism (HCC)    recurrent, second episode in 06/2020  . Walker as ambulation aid   . Wears dentures   . Wears glasses     Past Surgical History:  Procedure Laterality Date  . ABDOMINAL HYSTERECTOMY     and BSO  . CATARACT EXTRACTION Left 10/2018  . [place]  . COLONOSCOPY    . EYE SURGERY Bilateral    CATARACT SX  . HERNIA REPAIR  2001   incisional  . JOINT REPLACEMENT Left    Knee  .  MAXILLARY ANTROSTOMY Right 01/20/2020   Procedure: MAXILLARY ANTROSTOMY  WITH BIOPSY CALDWELL APPROACH;  Surgeon: Jerrell Belfast, MD;  Location: [place];  Service: ENT;  Laterality: Right;  . MULTIPLE TOOTH EXTRACTIONS  2011  . NASAL ENDOSCOPY Right 02/17/2020   Procedure: NASAL ENDOSCOPY WITH RIGHT INFERIOR TURNBINATE REDUCTION;  Surgeon: Jerrell Belfast, MD;  Location: [place];  Service: ENT;  Laterality: Right;  . neck mass removal       "fatty tissue, not cancerous" per pt's son  . SINUS ENDO WITH FUSION Right 01/20/2020   Procedure: SINUS ENDO WITH FUSION WITH BIOSPY;  Surgeon: Jerrell Belfast, MD;  Location: [place];  Service: ENT;  Laterality: Right;  . TOTAL KNEE ARTHROPLASTY Left 07/17/2014   Procedure: TOTAL KNEE ARTHROPLASTY;  Surgeon: Kerin Salen, MD;  Location: [place];  Service: Orthopedics;  Laterality: Left;     Prior to Admission medications   Medication Sig Start Date End Date Taking? Authorizing Provider  atorvastatin (LIPITOR) 10 MG tablet TAKE ONE TABLET BY MOUTH  ONCE DAILY IN THE EVENING Patient taking differently: Take 10 mg by mouth every evening.  03/18/16  Yes Gildardo Cranker, DO  diclofenac Sodium (VOLTAREN) 1 % GEL Apply 2 g topically 4 (four) times daily as needed (pain).   Yes [provider]  furosemide (LASIX) 20 MG tablet Take 1 tablet (20 mg total) by mouth daily. 02/16/16  Yes Bonnielee Haff, MD  gabapentin (NEURONTIN) 300 MG capsule Take 300 mg by mouth 3 (three) times daily.   Yes [provider]  labetalol (NORMODYNE) 100 MG tablet Take 100 mg by mouth 2 (two) times daily.   Yes [provider]  medroxyPROGESTERone (PROVERA) 10 MG tablet Take 10 mg by mouth every evening.  01/06/20  Yes [provider]  ondansetron (ZOFRAN) 4 MG tablet Take 4 mg by mouth 2 (two) times daily.  04/26/20  Yes [provider]  oxyCODONE-acetaminophen (PERCOCET) 7.5-325 MG tablet Take 1 tablet by mouth 4 (four) times daily.  07/02/20  Yes [provider]  potassium chloride (KLOR-CON) 10 MEQ tablet Take 10 mEq by mouth 2 (two) times daily. 06/20/20  Yes [provider]  prochlorperazine (COMPAZINE) 5 MG tablet Take 5 mg by mouth 2 (two) times daily.  04/26/20  Yes [provider]  sucralfate (CARAFATE) 1 GM/10ML suspension Take 1 g by mouth 3 (three) times daily before meals. 01/06/20  Yes [provider]  Lidocaine 4 % PTCH Apply 1 patch  topically 2 (two) times daily. Patient not taking: Reported on 05/28/2020 05/22/20   Varney Biles, MD  oxyCODONE-acetaminophen (PERCOCET/ROXICET) 5-325 MG tablet Take 1 tablet by mouth every 8 (eight) hours as needed for severe pain. Patient not taking: Reported on 05/28/2020 05/22/20   Varney Biles, MD    Inpatient Medications: Scheduled Meds: . atorvastatin  10 mg Oral QPM  . Chlorhexidine Gluconate Cloth  6 each Topical Daily  . gabapentin  300 mg Oral TID  . medroxyPROGESTERone  10 mg Oral Daily  . ondansetron  4 mg Oral BID  . oxyCODONE-acetaminophen  1 tablet Oral QID  . pantoprazole  40 mg Oral Daily  . potassium chloride  10 mEq Oral BID  . prochlorperazine  5 mg Oral Q12H  . sodium chloride flush  10-40 mL Intracatheter Q12H  . sucralfate  1 g Oral TID AC   Continuous Infusions: . heparin 1,150 Units/hr (07/23/20 1001)   PRN Meds: docusate sodium, polyethylene glycol, sodium chloride flush  Allergies:  Allergies  Allergen Reactions  . Tylenol [Acetaminophen] Other (See Comments)    States "seems like eats my stomach"  . Sulfa Antibiotics Other (See Comments)    Unknown allergic reaction    Social History:   Social History   Socioeconomic History  . Marital status: Divorced    Spouse name: Not on file  . Number of children: 2  . Years of education: Not on file  . Highest education level: High school graduate  Occupational History  . Not on file  Tobacco Use  . Smoking status: Never Smoker  . Smokeless tobacco: Never Used  Vaping Use  . Vaping Use: Never used  Substance and Sexual Activity  . Alcohol use: No  . Drug use: No  . Sexual activity: Not Currently    Comment: Hysterectoomy  Other Topics Concern  . Not on file  Social History Narrative   Lives alone    Right handed   Social Determinants of Health   Financial Resource Strain:   . Difficulty of Paying Living Expenses: Not on file  Food Insecurity:   . Worried About Charity fundraiser  in the Last Year: Not on file  . Ran Out of Food in the Last Year: Not on file  Transportation Needs:   . Lack of Transportation (Medical): Not on file  . Lack of Transportation (Non-Medical): Not on file  Physical Activity:   . Days of Exercise per Week: Not on file  . Minutes of Exercise per Session: Not on file  Stress:   . Feeling of Stress : Not on file  Social Connections:   . Frequency of Communication with Friends and Family: Not on file  . Frequency of Social Gatherings with Friends and Family: Not on file  . Attends Religious Services: Not on file  . Active Member of Clubs or Organizations: Not on file  . Attends Archivist Meetings: Not on file  . Marital Status: Not on file  Intimate Partner Violence:   . Fear of Current or Ex-Partner: Not on file  . Emotionally Abused: Not on file  . Physically Abused: Not on file  . Sexually Abused: Not on file    Family History:   Family History  Problem Relation Age of Onset  . Colon cancer Sister   . Cancer Sister        unsure of type of cancer   Family Status:  Family Status  Relation Name Status  . Mother  Deceased  . Father  Deceased  . Sister  (Not Specified)  . Sister  (Not Specified)    ROS:  Please see the history of present illness.  All other ROS reviewed and negative.     Physical Exam/Data:   Vitals:   07/22/20 1323 07/22/20 2027 07/23/20 0629 07/23/20 1153  BP: 120/87 103/77 112/72 113/68  Pulse: 77 97 92 72  Resp: 17 16 18 18   Temp: 98.4 F (36.9 C) 97.8 F (36.6 C) 97.6 F (36.4 C) 98.7 F (37.1 C)  TempSrc: Oral Oral Oral Oral  SpO2: 98% 99% 100% 100%  Weight:   84.1 kg   Height:        Intake/Output Summary (Last 24 hours) at 07/23/2020 1241 Last data filed at 07/23/2020 0631 Gross per 24 hour  Intake 541.66 ml  Output 2100 ml  Net -1558.34 ml   Filed Weights   07/21/20 0200 07/22/20 0529 07/23/20 0629  Weight: 82.5 kg 84.7 kg 84.1 kg   Body  mass index is 35.03 kg/m.    General: Overweight, NAD Neck: Negative for carotid bruits. No JVD Lungs:Clear to ausculation bilaterally. No wheezes, rales, or rhonchi. Breathing is unlabored. Cardiovascular: RRR with S1 S2. No murmurs Abdomen: Soft, non-tender, non-distended. No obvious abdominal masses. Extremities: No edema. Radial pulses 2+ bilaterally Neuro: Alert and oriented. No focal deficits. No facial asymmetry. MAE spontaneously. Psych: Responds to questions appropriately with normal affect.     EKG:  The EKG was personally reviewed and demonstrates: 07/23/20 ST with HR 133 Telemetry:  Telemetry was personally reviewed and demonstrates: 07/23/20 Difficult to assess between atrial tachycardia versus AF with RVR given higher rates.   Relevant CV Studies:  Echocardiogram 07/19/2020:  1. Left ventricular ejection fraction, by estimation, is 70 to 75%. The  left ventricle has hyperdynamic function. The left ventricle has no  regional wall motion abnormalities. There is mild left ventricular  hypertrophy. Left ventricular diastolic  parameters are consistent with Grade I diastolic dysfunction (impaired  relaxation).  2. Positive Mcconels signs, with severe hypokinesia of the mid free wall  with sparing/hypercontractile apex. The enlarged RV leads to underfilling  of the LV. . Right ventricular systolic function is severely reduced. The  right ventricular size is  severely enlarged. There is moderately elevated pulmonary artery systolic  pressure.  3. Right atrial size was severely dilated.  4. The mitral valve is normal in structure. No evidence of mitral valve  regurgitation. No evidence of mitral stenosis.  5. The aortic valve has an indeterminant number of cusps. Aortic valve  regurgitation is not visualized. No aortic stenosis is present.  6. Moderate pulmonary HTN with PASP 57 mmHg. PASP may underestimate  severity of true pulmonary vascular resistance given severe RV  dysfunciton.  7. The  inferior vena cava is dilated in size with <50% respiratory  variability, suggesting right atrial pressure of 15 mmHg.   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  - No cystic structure found in the popliteal fossa.    LEFT:  - Findings consistent with acute deep vein thrombosis involving the left  femoral vein, and left popliteal vein.  - No cystic structure found in the popliteal fossa.   Laboratory Data:  Chemistry Recent Labs  Lab 07/19/20 1202 07/20/20 0629 07/21/20 0700  NA 141 138 138  K 4.1 4.2 4.6  CL 109 109 106  CO2 21* 20* 20*  GLUCOSE 137* 105* 105*  BUN 14 12 12   CREATININE 0.83 0.69 0.89  CALCIUM 9.1 8.6* 8.7*  GFRNONAA >60 >60 >60  GFRAA >60 >60 >60  ANIONGAP 11 9 12     Total Protein  Date Value Ref Range Status  07/19/2020 5.7 (L) 6.5 - 8.1 g/dL Final   Albumin  Date Value Ref Range Status  07/19/2020 3.0 (L) 3.5 - 5.0 g/dL Final   AST  Date Value Ref Range Status  07/19/2020 24 15 - 41 U/L Final   ALT  Date Value Ref Range Status  07/19/2020 15 0 - 44 U/L Final   Alkaline Phosphatase  Date Value Ref Range Status  07/19/2020 86 38 - 126 U/L Final   Total Bilirubin  Date Value Ref Range Status  07/19/2020 1.1 0.3 - 1.2 mg/dL Final   Hematology Recent Labs  Lab 07/21/20 0700 07/22/20 0328 07/23/20 0344  WBC 9.5 8.4 8.2  RBC 3.10* 2.97* 3.11*  HGB 9.7* 9.5* 9.7*  HCT 31.5* 30.4* 32.1*  MCV 101.6* 102.4* 103.2*  MCH 31.3 32.0 31.2  MCHC 30.8 31.3 30.2  RDW 17.2* 17.4* 18.0*  PLT 106* 116* 122*   Cardiac EnzymesNo results for input(s): TROPONINI in the last 168 hours. No results for input(s): TROPIPOC in the last 168 hours.  BNP Recent Labs  Lab 07/20/20 0629 07/21/20 0700 07/22/20 0328  BNP 1,560.3* 1,382.8* 1,402.1*    DDimer No results for input(s): DDIMER in the last 168 hours. TSH:  Lab Results    Component Value Date   TSH 0.948 02/15/2016   Lipids: Lab Results  Component Value Date   CHOL 129 02/13/2016   HDL 48 02/13/2016   LDLCALC 69 02/13/2016   TRIG 61 02/13/2016   CHOLHDL 2.7 02/13/2016   HgbA1c: Lab Results  Component Value Date   HGBA1C 5.1 01/17/2020    Radiology/Studies:  CT Angio Chest PE W and/or Wo Contrast  Result Date: 07/19/2020 CLINICAL DATA:  Chest pain.  Question pulmonary embolism. EXAM: CT ANGIOGRAPHY CHEST WITH CONTRAST TECHNIQUE: Multidetector CT imaging of the chest was performed using the standard protocol during bolus administration of intravenous contrast. Multiplanar CT image reconstructions and MIPs were obtained to evaluate the vascular anatomy. CONTRAST:  78mL OMNIPAQUE IOHEXOL 350 MG/ML SOLN COMPARISON:  None. FINDINGS: Cardiovascular: Pulmonary arterial opacification is excellent. There is large volume pulmonary embolism affecting both lungs, including a saddle embolus. Diminished perfusion particularly of the left lower lobe. Overall heart size is normal. Right ventricular strain with a ratio of over 3. No aortic atherosclerotic calcification. Mediastinum/Nodes: No mass or adenopathy. Lungs/Pleura: Lungs are clear presently.  No effusions. Upper Abdomen: Negative Musculoskeletal: Curvature and chronic degenerative change. Recent or unhealed compression fracture at T12. This was present on 05/22/2020 but has shown some further collapse. Review of the MIP images confirms the above findings. IMPRESSION: 1. Large volume pulmonary embolism affecting both lungs, including a saddle embolus. Right ventricular strain with a ratio of over 3. 2. Recent or unhealed compression fracture at T12. This was present on 05/22/2020 but has shown some further collapse. 3. Critical Value/emergent results were called by telephone at the time of interpretation on 07/19/2020 at 1:28 pm to provider [facility] , who verbally acknowledged these results. Electronically Signed    By: Nelson Chimes M.D.   On: 07/19/2020 13:33   DG CHEST PORT 1 VIEW  Result Date: 07/21/2020 CLINICAL DATA:  Pulmonary embolism EXAM: PORTABLE CHEST 1 VIEW COMPARISON:  07/20/2020 FINDINGS: There is a right chest wall port a catheter with tip in the cavoatrial junction. Stable cardiomediastinal contours. No pleural effusion or edema. No airspace densities identified. Degenerative type change noted within both glenohumeral joints and both AC joints. IMPRESSION: No active cardiopulmonary abnormalities. Electronically Signed   By: Kerby Moors M.D.   On: 07/21/2020 06:35   DG Chest Port 1 View  Result Date: 07/20/2020 CLINICAL DATA:  Acute. EXAM: PORTABLE CHEST 1 VIEW COMPARISON:  CT 07/19/2020.  Chest x-ray 05/28/2020. FINDINGS: PowerPort catheter in unchanged position with tip over the right atrium. Stable cardiomegaly. Low lung volumes. No focal infiltrate. No pleural effusion or pneumothorax. Degenerative change thoracic spine and both shoulders. IMPRESSION: Cardiomegaly. No pulmonary venous congestion. No acute pulmonary infiltrate. Electronically Signed   By: Marcello Moores  Register   On: 07/20/2020 06:22   DG Knee Complete 4 Views Left  Result Date: 07/19/2020 CLINICAL DATA:  69 year old female status post fall at home today. Pain and swelling. EXAM: LEFT KNEE - COMPLETE 4+ VIEW COMPARISON:  Left knee series 07/20/2014. FINDINGS: Chronic left total knee arthroplasty. No evidence of joint effusion on the cross-table lateral view. Hardware appears stable and normally aligned. Chronic bony overgrowth at the lateral proximal tibia and fibula which might be ankylosed. No acute osseous abnormality identified. No discrete soft tissue injury. IMPRESSION: Chronic left total knee arthroplasty. No acute fracture or dislocation identified. Electronically Signed   By: Genevie Ann M.D.   On: 07/19/2020 12:51   ECHOCARDIOGRAM COMPLETE  Result Date: 07/19/2020    ECHOCARDIOGRAM REPORT   Patient Name:   Lauren Santana  [facility] Date of Exam: 07/19/2020 Medical Rec #:  [id number]          Height:       61.0 in Accession #:    [id number]         Weight:       240.0 lb Date of Birth:  04-28-1951         BSA:          2.041 m Patient Age:    57 years           BP:           108/80 mmHg Patient Gender: F                  HR:           108 bpm. Exam Location:  Inpatient Procedure: 2D Echo STAT ECHO Indications:    acute respiratory insufficiency 518.82  History:        Patient has prior history of Echocardiogram examinations, most                 recent 02/15/2016. Pulmonary embolism, Signs/Symptoms:edema; Risk                 Factors:Dyslipidemia and Hypertension.  Sonographer:    Johny Chess Referring Phys: Noe Gens, L IMPRESSIONS  1. Left ventricular ejection fraction, by estimation, is 70 to 75%. The left ventricle has hyperdynamic function. The left ventricle has no regional wall motion abnormalities. There is mild left ventricular hypertrophy. Left ventricular diastolic parameters are consistent with Grade I diastolic dysfunction (impaired relaxation).  2. Positive Mcconels signs, with severe hypokinesia of the mid free wall with sparing/hypercontractile apex. The enlarged RV leads to underfilling of the LV. . Right ventricular systolic function is severely reduced. The right ventricular size is severely enlarged. There is moderately elevated pulmonary artery systolic pressure.  3. Right atrial size was severely dilated.  4. The mitral valve is normal in structure. No evidence of mitral valve regurgitation. No evidence of mitral stenosis.  5. The aortic valve has an indeterminant number of cusps. Aortic valve regurgitation is not visualized. No aortic stenosis is present.  6. Moderate pulmonary HTN with PASP 57 mmHg. PASP may underestimate severity of true pulmonary vascular resistance given severe RV dysfunciton.  7. The inferior vena cava is dilated in size with <50% respiratory variability, suggesting right atrial  pressure of 15 mmHg. FINDINGS  Left Ventricle: Left ventricular ejection fraction, by estimation, is 70 to 75%. The left ventricle has hyperdynamic function. The left ventricle has no regional wall motion abnormalities. The left ventricular internal cavity size was normal in size. There is mild left ventricular hypertrophy. Left ventricular diastolic parameters are consistent with Grade I diastolic dysfunction (impaired relaxation). Right Ventricle: Positive Mcconels signs, with severe hypokinesia of the mid free wall with sparing/hypercontractile apex. The enlarged RV leads to underfilling of the LV. The right ventricular size  is severely enlarged. No increase in right ventricular wall thickness. Right ventricular systolic function is severely reduced. There is moderately elevated pulmonary artery systolic pressure. The tricuspid regurgitant velocity is 3.26 m/s, and with an assumed right atrial pressure of 15 mmHg, the estimated right ventricular systolic pressure is 34.7 mmHg. Left Atrium: Left atrial size was normal in size. Right Atrium: Right atrial size was severely dilated. Pericardium: A small pericardial effusion is present. The pericardial effusion is circumferential. Mitral Valve: The mitral valve is normal in structure. No evidence of mitral valve regurgitation. No evidence of mitral valve stenosis. Tricuspid Valve: The tricuspid valve is normal in structure. Tricuspid valve regurgitation is mild . No evidence of tricuspid stenosis. Aortic Valve: The aortic valve has an indeterminant number of cusps. . There is mild thickening and mild calcification of the aortic valve. Aortic valve regurgitation is not visualized. No aortic stenosis is present. Mild aortic valve annular calcification. There is mild thickening of the aortic valve. There is mild calcification of the aortic valve. Aortic valve mean gradient measures 5.9 mmHg. Aortic valve peak gradient measures 14.0 mmHg. Pulmonic Valve: The pulmonic  valve was not well visualized. Pulmonic valve regurgitation is not visualized. No evidence of pulmonic stenosis. Aorta: The aortic root is normal in size and structure. Pulmonary Artery: Moderate pulmonary HTN with PASP 57 mmHg. PASP may underestimate severity of true pulmonary vascular resistance given severe RV dysfunciton. Venous: The inferior vena cava is dilated in size with less than 50% respiratory variability, suggesting right atrial pressure of 15 mmHg. IAS/Shunts: No atrial level shunt detected by color flow Doppler.  LEFT VENTRICLE PLAX 2D LVIDd:         3.67 cm Diastology LVIDs:         2.92 cm LV e' lateral:   10.20 cm/s LV PW:         1.08 cm LV E/e' lateral: 4.9 LV IVS:        1.05 cm LV e' medial:    5.87 cm/s                        LV E/e' medial:  8.6  RIGHT VENTRICLE            IVC RV S prime:     5.98 cm/s  IVC diam: 2.52 cm TAPSE (M-mode): 0.9 cm LEFT ATRIUM             Index       RIGHT ATRIUM           Index LA diam:        2.70 cm 1.32 cm/m  RA Area:     22.30 cm LA Vol (A2C):   28.0 ml 13.72 ml/m RA Volume:   77.80 ml  38.12 ml/m LA Vol (A4C):   27.8 ml 13.62 ml/m LA Biplane Vol: 30.0 ml 14.70 ml/m  AORTIC VALVE AV Vmax:           186.77 cm/s AV Vmean:          111.289 cm/s AV VTI:            0.206 m AV Peak Grad:      14.0 mmHg AV Mean Grad:      5.9 mmHg LVOT Vmax:         102.44 cm/s LVOT Vmean:        63.093 cm/s LVOT VTI:          0.141 m LVOT/AV VTI ratio:  0.37  AORTA Ao Root diam: 2.70 cm Ao Asc diam:  2.90 cm MV E velocity: 50.40 cm/s  TRICUSPID VALVE MV A velocity: 85.40 cm/s  TR Peak grad:   42.5 mmHg MV E/A ratio:  0.59        TR Vmax:        326.00 cm/s                             SHUNTS                            Systemic VTI: 0.14 m Carlyle Dolly MD Electronically signed by Carlyle Dolly MD Signature Date/Time: 07/19/2020/6:53:15 PM    Final    VAS [place] LOWER EXTREMITY VENOUS (DVT)  Result Date: 07/20/2020  Lower Venous DVTStudy Indications: Swelling.  Limitations:  Body habitus and poor ultrasound/tissue interface. Comparison Study: 03/16/2019- right lower extremity venous duplex: chronic DVT                   right popliteal vein. Performing Technologist: Oliver Hum RVT  Examination Guidelines: A complete evaluation includes B-mode imaging, spectral Doppler, color Doppler, and power Doppler as needed of all accessible portions of each vessel. Bilateral testing is considered an integral part of a complete examination. Limited examinations for reoccurring indications may be performed as noted. The reflux portion of the exam is performed with the patient in reverse Trendelenburg.  +---------+---------------+---------+-----------+----------+--------------+ RIGHT    CompressibilityPhasicitySpontaneityPropertiesThrombus Aging +---------+---------------+---------+-----------+----------+--------------+ CFV      Full           Yes      Yes                                 +---------+---------------+---------+-----------+----------+--------------+ SFJ      Full                                                        +---------+---------------+---------+-----------+----------+--------------+ FV Prox  Full                                                        +---------+---------------+---------+-----------+----------+--------------+ FV Mid   Full                                                        +---------+---------------+---------+-----------+----------+--------------+ FV DistalFull                                                        +---------+---------------+---------+-----------+----------+--------------+ POP      Partial        Yes      Yes  Chronic        +---------+---------------+---------+-----------+----------+--------------+ PTV      Full                                                        +---------+---------------+---------+-----------+----------+--------------+ PERO     Full                                                         +---------+---------------+---------+-----------+----------+--------------+   +---------+---------------+---------+-----------+----------+--------------+ LEFT     CompressibilityPhasicitySpontaneityPropertiesThrombus Aging +---------+---------------+---------+-----------+----------+--------------+ CFV      Full           Yes      Yes                                 +---------+---------------+---------+-----------+----------+--------------+ SFJ      Full                                                        +---------+---------------+---------+-----------+----------+--------------+ FV Prox  Full                                                        +---------+---------------+---------+-----------+----------+--------------+ FV Mid   Partial                                      Acute          +---------+---------------+---------+-----------+----------+--------------+ FV DistalNone           No       No                   Acute          +---------+---------------+---------+-----------+----------+--------------+ PFV      Full                                                        +---------+---------------+---------+-----------+----------+--------------+ POP      None           No       No                   Acute          +---------+---------------+---------+-----------+----------+--------------+ PTV                                                   Not visualized +---------+---------------+---------+-----------+----------+--------------+ PERO  Not visualized +---------+---------------+---------+-----------+----------+--------------+   Left Technical Findings: Not visualized segments include PTV, peroneal veins.   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 - No cystic structure found in the  popliteal fossa.  LEFT: - Findings consistent with acute deep vein thrombosis involving the left femoral vein, and left popliteal vein. - No cystic structure found in the popliteal fossa.  *See table(s) above for measurements and observations. Electronically signed by Servando Snare MD on 07/20/2020 at 11:22:41 AM.    Final    Assessment and Plan:   1. Atrial fibrillation/atrial flutter with RVR: -Previously seen by cardiology in 2017 in the setting of acute pulmonary embolism with ectopic atrial tachycardia which essentially resolved spontaneously to NSR without treatment. At that time, patient was noted to be on home Labetolol which has since been discontinued. Unfortunately on hospital presentation, CTA with sub massive saddle PE and LE dopplers with acute left femoral/popliteal DVT.  -Continue current regimen for acute pulmonary embolism with IV heparin.  -Follow recommendations per primary team, oncology for transition to Lovenox BID -Hemoglobin, 9.7 today which appears to be at her baseline without acute change -Will plan to hydrate with 514ml NS today to allow for increased CO. AF exacerbated by low volume and RV strain. Would hold off on beta blocker to slow rates for now. If AF persists then can consider DCCV given duration of AC since hospitalization  -Would not diurese at this point  -Cardiology follow response closely   2. Acute PE (pulmonary embolism) and RLE DVT: -CT on hospital presentation with acute submassive saddle PE with severe right heart strain.  -Echocardiogram is as above.  -Lower extremity Dopplers to reveal right lower extremity DVT in the femoral and popliteal veins.   3. Thrombocytopenia: -Likely in the setting of lymphoma  -Platelet counts are low, 116 yesterday  -Admission plts at 73  4. Essential HTN -Stable, 113/68>112/72 -Holding Lasix, labetolol   5. Hyperlipidemia  -Continue home medications  6. B-Cell lymphoma: -Followed OP with [facility] for  chemotherapy -Oncology on board  -Appreciate assistance    For questions or updates, please contact [facility] Please consult [email or web address] for contact info under Cardiology/STEMI.   Lyndel Safe NP-[facility] Pager: [phone] 07/23/2020 12:41 PM   History and all data above reviewed.  Patient examined.  I agree with the findings as above.  The patient is a lovely lady with previous history of DVT.  She now presents with DVT and bilateral large PE with RV dysfunction.  she has good (hyperdynamic) LV function.  We are called because of tachycardia.  Tele demonstrates intermittent atrial fib/flutter.  The patient exam reveals OHY:[id number]  ,  Lungs: Decreased breath sounds bilaterally  ,  Abd: Positive bowel sounds, no rebound no guarding, Ext No edema, positive calf cord right leg  .  All available labs, radiology testing, previous records reviewed. Agree with documented assessment and plan.   Atrial fib/flutter:  She is already anticoagulated.  She has significant RV dysfunction and a hyperdynamic LV function likely related to underfilling secondary to decreased preload from the PE.  She will be volume sensitive with this and I note that she has auto diuresed the last couple of days.  I would like to start with some IV hydration.  If she remains tachycardic in her atrial fib we can start with low dose beta blocker.  If note her BP is marginal.  She does have an elevated BNP which can  happen with significant RV strain and elevated hs trop consistent with massive PE.     Jeneen Rinks Alveda Vanhorne  3:59 PM  07/23/2020

## 2020-07-23 NOTE — Care Management Important Message (Signed)
Important Message  Patient Details IM Letter given to the Patient Name: Lauren Santana MRN: [id number] Date of Birth: October 15, 1951   Medicare Important Message Given:  Yes     [name], [name] 07/23/2020, 10:05 AM

## 2020-07-23 NOTE — Progress Notes (Signed)
Received called from CCMD that pt had converted to A fib w/ RVR. 12 lead EKG and VS obtained. Pt up in chair at this time eating lunch. No acute distress noted.  Dr. Algis Liming notified. No orders received. Will continue to monitor. Dheeraj Hail, Laurel Dimmer, RN

## 2020-07-23 NOTE — Progress Notes (Addendum)
PROGRESS NOTE   Lauren [facility]  MPN:[id number]    DOB: Mar 22, 1951    DOA: 07/19/2020  PCP: Lucianne Lei, MD   I have briefly reviewed patients previous medical records in [facility].  Chief Complaint  Patient presents with  . Diarrhea  . Fall  . Knee Pain    left    Brief Narrative:  69 year old female with PMH of diffuse large B cell lymphoma being treated at [facility], pulmonary embolism in 2017 treated with ~6 months of Xarelto, prediabetes, HTN, HLD and endometriosis, presented to the [facility] on 8/26 with reports of diarrhea, lightheadedness and a fall.  CTA chest in ED showed large volume bilateral pulmonary embolism, including a saddle embolus, RV strain with ratio >3, initiated on IV heparin and CCM admitted the patient to the hospital.  Care transferred to [facility] on 8/28.  Hematology consulting.  Course complicated by A. fib with RVR, Cardiology was consulted.   Assessment & Plan:  Active Problems:   Pulmonary embolism (HCC)   Acute submassive pulmonary embolism with right heart strain, recurrent/acute left femoral and popliteal vein DVT/chronic right popliteal vein DVT: CTA chest 8/26 showed large volume pulmonary embolus bilaterally including a saddle embolus with RV strain.  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  2D echo 8/26: LVEF 00-86%, grade 1 diastolic dysfunction, positive McConnell sign with severe hypokinesia of the mid free wall with sparing/hypercontractile apex, severely reduced RV systolic function.  Troponin peaked to 218 but has gradually reduced to 65.  Troponin elevation likely secondary to demand ischemia.  As per PCCM/hematology input, not a candidate for lytic treatment but only as last resort, continue IV heparin x5 days and then hematology recommends transitioning to Lovenox.  Hemodynamically stable and not hypoxic.  Initiated IV heparin on 8/26, continued through 8/30 and transition  to Lovenox on 8/31 at time of discharge.  Started gentle and gradual mobilization.  PT recommends SNF, patient and son agreeable.  A. fib with RVR: On telemetry, she had intermittent episodes of A. fib with RVR up to 140s prior to today.  However since this morning she went into A. fib with RVR in the 130s.  Asymptomatic.  Likely due to strain from acute submassive PE.  Already on anticoagulation with IV heparin which will be transitioned to Lovenox tomorrow.  Cardiology consulted and are bolusing with 500 mL normal saline because A. fib may be due to RV strain and low cardiac output.  Avoiding beta-blockers.  Macrocytic anemia/anemia of chronic disease and malignancy Hemoglobin stable in the 9-10 g range.  Follow daily CBCs while on IV heparin.  Stable.  Thrombocytopenia chronic May be related to her lymphoma or its treatment.  Presented with platelet count of 73 which is improved to 106.  Follow daily CBCs.  Counts improving up to 122 today.  Essential hypertension: Controlled.  Holding home Lasix, labetalol due to submassive PE to avoid hypotension/hemodynamic compromise.  However now has new onset A. fib with RVR and will await Cardiology input to see if they recommend resuming labetalol versus another beta-blocker versus amiodarone.  Dyslipidemia  Chronic pain: Continue home oxycodone.  Controlled.  Large B-cell lymphoma: Outpatient follow-up with [facility].  Chronic constipation, diarrhea s/p laxative use Seems to have resolved.  Compression fracture of T12: No back pain reported today.  Body mass index is 35.03 kg/m./Obesity.  GOC: PCCM had this discussion with her on 8/27 at which point she stated  that she would be accepting of short-term support but would not "want to live on machines".   DVT prophylaxis: IV heparin ongoing Code Status: Full Family Communication: Discussed in detail with patient's son via phone on 8/30, updated care and answered questions.   Disposition:    Status is: Inpatient  Remains inpatient appropriate because:Inpatient level of care appropriate due to severity of illness   Dispo: The patient is from: Home              Anticipated d/c is to: SNF              Anticipated d/c date is: 1 to 2 days.  Pending transitioning to full dose Lovenox and stability of A. fib.              Patient currently is not medically stable to d/c.  Ongoing IV heparin for submassive PE.  Also new onset A. fib with RVR.        Consultants:   PCCM Hematology. Cardiology  Procedures:   None  Antimicrobials:   None   Subjective:  Patient denies complaints.  No chest pain, dyspnea, dizziness, lightheadedness, palpitations or bleeding reported.  Objective:   Vitals:   07/22/20 1323 07/22/20 2027 07/23/20 0629 07/23/20 1153  BP: 120/87 103/77 112/72 113/68  Pulse: 77 97 92 72  Resp: 17 16 18 18   Temp: 98.4 F (36.9 C) 97.8 F (36.6 C) 97.6 F (36.4 C) 98.7 F (37.1 C)  TempSrc: Oral Oral Oral Oral  SpO2: 98% 99% 100% 100%  Weight:   84.1 kg   Height:        General exam: Pleasant elderly female, moderately built and obese lying comfortably propped up in bed without distress.  Getting ready to eat breakfast this morning. Respiratory system: Clear to auscultation. Respiratory effort normal.  Right upper anterior chest Port-A-Cath. Cardiovascular system: S1 & S2 heard, RRR. No JVD, murmurs, rubs, gallops or clicks.  Trace bilateral leg edema.  When I reviewed telemetry this morning: She had had occasional episodes of A. fib with RVR up to 140s but had reverted to sinus rhythm.  Subsequently she went back into A. fib with RVR in the 130s per RN report. Gastrointestinal system: Abdomen is nondistended, soft and nontender. No organomegaly or masses felt. Normal bowel sounds heard. Central nervous system: Alert and oriented. No focal neurological deficits. Extremities: Symmetric 5 x 5 power. Skin: No rashes, lesions or ulcers Psychiatry:  Judgement and insight appear normal. Mood & affect appropriate.     Data Reviewed:   I have personally reviewed following labs and imaging studies   CBC: Recent Labs  Lab 07/19/20 1202 07/20/20 0629 07/21/20 0700 07/22/20 0328 07/23/20 0344  WBC 10.4   < > 9.5 8.4 8.2  NEUTROABS 7.4  --   --   --   --   HGB 10.7*   < > 9.7* 9.5* 9.7*  HCT 33.8*   < > 31.5* 30.4* 32.1*  MCV 100.6*   < > 101.6* 102.4* 103.2*  PLT 73*   < > 106* 116* 122*   < > = values in this interval not displayed.    Basic Metabolic Panel: Recent Labs  Lab 07/19/20 1202 07/20/20 0629 07/21/20 0700  NA 141 138 138  K 4.1 4.2 4.6  CL 109 109 106  CO2 21* 20* 20*  GLUCOSE 137* 105* 105*  BUN 14 12 12   CREATININE 0.83 0.69 0.89  CALCIUM 9.1 8.6* 8.7*  MG  --  2.0 2.0  PHOS  --  3.5  --     Liver Function Tests: Recent Labs  Lab 07/19/20 1202  AST 24  ALT 15  ALKPHOS 86  BILITOT 1.1  PROT 5.7*  ALBUMIN 3.0*    CBG: No results for input(s): GLUCAP in the last 168 hours.  Microbiology Studies:   Recent Results (from the past 240 hour(s))  SARS Coronavirus 2 by RT PCR (hospital order, performed in [facility] hospital lab) Nasopharyngeal Nasopharyngeal Swab     Status: None   Collection Time: 07/19/20  1:40 PM   Specimen: Nasopharyngeal Swab  Result Value Ref Range Status   SARS Coronavirus 2 NEGATIVE NEGATIVE Final    Comment: (NOTE) SARS-CoV-2 target nucleic acids are NOT DETECTED.  The SARS-CoV-2 RNA is generally detectable in upper and lower respiratory specimens during the acute phase of infection. The lowest concentration of SARS-CoV-2 viral copies this assay can detect is 250 copies / mL. A negative result does not preclude SARS-CoV-2 infection and should not be used as the sole basis for treatment or other patient management decisions.  A negative result may occur with improper specimen collection / handling, submission of specimen other than nasopharyngeal swab, presence  of viral mutation(s) within the areas targeted by this assay, and inadequate number of viral copies (<250 copies / mL). A negative result must be combined with clinical observations, patient history, and epidemiological information.  Fact Sheet for Patients:   StrictlyIdeas.no  Fact Sheet for Healthcare Providers: BankingDealers.co.za  This test is not yet approved or  cleared by the [place] FDA and has been authorized for detection and/or diagnosis of SARS-CoV-2 by FDA under an Emergency Use Authorization (EUA).  This EUA will remain in effect (meaning this test can be used) for the duration of the COVID-19 declaration under Section 564(b)(1) of the Act, 21 U.S.C. section 360bbb-3(b)(1), unless the authorization is terminated or revoked sooner.  Performed at [facility], [place] [address] [place], [place] [address]      Radiology Studies:  No results found.   Scheduled Meds:   . atorvastatin  10 mg Oral QPM  . Chlorhexidine Gluconate Cloth  6 each Topical Daily  . gabapentin  300 mg Oral TID  . medroxyPROGESTERone  10 mg Oral Daily  . ondansetron  4 mg Oral BID  . oxyCODONE-acetaminophen  1 tablet Oral QID  . pantoprazole  40 mg Oral Daily  . potassium chloride  10 mEq Oral BID  . prochlorperazine  5 mg Oral Q12H  . sodium chloride flush  10-40 mL Intracatheter Q12H  . sucralfate  1 g Oral TID AC    Continuous Infusions:   . heparin 1,150 Units/hr (07/23/20 1001)     LOS: 4 days     Vernell Leep, MD, [place], [facility]. [facility]    To contact the attending provider between 7A-7P or the covering provider during after hours 7P-7A, please log into the web site [email or web address] and access using universal [place] password for that web site. If you do not have the password, please call the hospital operator.  07/23/2020, 3:23 PM

## 2020-07-24 DIAGNOSIS — D696 Thrombocytopenia, unspecified: Secondary | ICD-10-CM

## 2020-07-24 DIAGNOSIS — I10 Essential (primary) hypertension: Secondary | ICD-10-CM | POA: Diagnosis not present

## 2020-07-24 DIAGNOSIS — E1169 Type 2 diabetes mellitus with other specified complication: Secondary | ICD-10-CM | POA: Diagnosis not present

## 2020-07-24 DIAGNOSIS — G8929 Other chronic pain: Secondary | ICD-10-CM

## 2020-07-24 DIAGNOSIS — D638 Anemia in other chronic diseases classified elsewhere: Secondary | ICD-10-CM

## 2020-07-24 DIAGNOSIS — M159 Polyosteoarthritis, unspecified: Secondary | ICD-10-CM | POA: Diagnosis not present

## 2020-07-24 DIAGNOSIS — I4891 Unspecified atrial fibrillation: Secondary | ICD-10-CM

## 2020-07-24 DIAGNOSIS — C8330 Diffuse large B-cell lymphoma, unspecified site: Secondary | ICD-10-CM

## 2020-07-24 DIAGNOSIS — E785 Hyperlipidemia, unspecified: Secondary | ICD-10-CM | POA: Diagnosis not present

## 2020-07-24 LAB — BASIC METABOLIC PANEL
Anion gap: 8 (ref 5–15)
BUN: 10 mg/dL (ref 8–23)
CO2: 22 mmol/L (ref 22–32)
Calcium: 8.7 mg/dL — ABNORMAL LOW (ref 8.9–10.3)
Chloride: 105 mmol/L (ref 98–111)
Creatinine, Ser: 0.71 mg/dL (ref 0.44–1.00)
GFR calc Af Amer: >60 mL/min (ref >60)
GFR calc non Af Amer: >60 mL/min (ref >60)
Glucose, Bld: 105 mg/dL — ABNORMAL HIGH (ref 70–99)
Potassium: 4.6 mmol/L (ref 3.5–5.1)
Sodium: 135 mmol/L (ref 135–145)

## 2020-07-24 LAB — CBC
HCT: 32.1 % — ABNORMAL LOW (ref 36.0–46.0)
Hemoglobin: 9.6 g/dL — ABNORMAL LOW (ref 12.0–15.0)
MCH: 31.3 pg (ref 26.0–34.0)
MCHC: 29.9 g/dL — ABNORMAL LOW (ref 30.0–36.0)
MCV: 104.6 fL — ABNORMAL HIGH (ref 80.0–100.0)
Platelets: 126 10*3/uL — ABNORMAL LOW (ref 150–400)
RBC: 3.07 MIL/uL — ABNORMAL LOW (ref 3.87–5.11)
RDW: 18.6 % — ABNORMAL HIGH (ref 11.5–15.5)
WBC: 7.1 10*3/uL (ref 4.0–10.5)
nRBC: 1.4 % — ABNORMAL HIGH (ref 0.0–0.2)

## 2020-07-24 LAB — SARS CORONAVIRUS 2 BY RT PCR (HOSPITAL ORDER, PERFORMED IN ~~LOC~~ HOSPITAL LAB): SARS Coronavirus 2: NEGATIVE

## 2020-07-24 MED ORDER — ENOXAPARIN SODIUM 100 MG/ML ~~LOC~~ SOLN
1.0000 mg/kg | Freq: Two times a day (BID) | SUBCUTANEOUS | Status: DC
  Administered 2020-07-24 – 2020-07-25 (×3): 85 mg via SUBCUTANEOUS
  Filled 2020-07-24 (×3): qty 1

## 2020-07-24 NOTE — Assessment & Plan Note (Signed)
-   baseline Hgb 9-10 g/dL and remains stable

## 2020-07-24 NOTE — Progress Notes (Addendum)
Progress Note  Patient Name: Lauren Santana Date of Encounter: 07/24/2020  Primary Cardiologist: New- Dr. Percival Spanish, MD  Subjective   Denies pain or SOB. A little more confused today than yesterday.   Inpatient Medications    Scheduled Meds: . atorvastatin  10 mg Oral QPM  . Chlorhexidine Gluconate Cloth  6 each Topical Daily  . enoxaparin (LOVENOX) injection  1 mg/kg Subcutaneous Q12H  . gabapentin  300 mg Oral TID  . medroxyPROGESTERone  10 mg Oral Daily  . ondansetron  4 mg Oral BID  . oxyCODONE-acetaminophen  1 tablet Oral QID  . pantoprazole  40 mg Oral Daily  . potassium chloride  10 mEq Oral BID  . prochlorperazine  5 mg Oral Q12H  . sodium chloride flush  10-40 mL Intracatheter Q12H  . sucralfate  1 g Oral TID AC   Continuous Infusions:  PRN Meds: docusate sodium, polyethylene glycol, sodium chloride flush   Vital Signs    Vitals:   07/23/20 1153 07/23/20 2038 07/24/20 0409 07/24/20 0427  BP: 113/68 (!) 137/92 125/74   Pulse: 72 81 (!) 55   Resp: 18 20 18    Temp: 98.7 F (37.1 C) 98.1 F (36.7 C) (!) 97.5 F (36.4 C)   TempSrc: Oral Oral Oral   SpO2: 100% 100% 98%   Weight:    85.2 kg  Height:        Intake/Output Summary (Last 24 hours) at 07/24/2020 0906 Last data filed at 07/24/2020 0530 Gross per 24 hour  Intake 1050.83 ml  Output 1150 ml  Net -99.17 ml   Filed Weights   07/22/20 0529 07/23/20 0629 07/24/20 0427  Weight: 84.7 kg 84.1 kg 85.2 kg    Physical Exam   General: Well developed, well nourished, NAD Neck: Negative for carotid bruits. No JVD Lungs:Clear to ausculation bilaterally. No wheezes, rales, or rhonchi. Breathing is unlabored. Cardiovascular: RRR with S1 S2. No murmurs Abdomen: Soft, non-tender, non-distended. No obvious abdominal masses. Extremities: Mild 1+ edema. Radial pulses 2+ bilaterally Neuro: Alert and oriented. No focal deficits. No facial asymmetry. MAE spontaneously. Psych: Responds to questions  appropriately with normal affect.    Labs    Chemistry Recent Labs  Lab 07/19/20 1202 07/19/20 1202 07/20/20 0629 07/21/20 0700 07/24/20 0405  NA 141   < > 138 138 135  K 4.1   < > 4.2 4.6 4.6  CL 109   < > 109 106 105  CO2 21*   < > 20* 20* 22  GLUCOSE 137*   < > 105* 105* 105*  BUN 14   < > 12 12 10   CREATININE 0.83   < > 0.69 0.89 0.71  CALCIUM 9.1   < > 8.6* 8.7* 8.7*  PROT 5.7*  --   --   --   --   ALBUMIN 3.0*  --   --   --   --   AST 24  --   --   --   --   ALT 15  --   --   --   --   ALKPHOS 86  --   --   --   --   BILITOT 1.1  --   --   --   --   GFRNONAA >60   < > >60 >60 >60  GFRAA >60   < > >60 >60 >60  ANIONGAP 11   < > 9 12 8    < > = values in  this interval not displayed.     Hematology Recent Labs  Lab 07/22/20 0328 07/23/20 0344 07/24/20 0405  WBC 8.4 8.2 7.1  RBC 2.97* 3.11* 3.07*  HGB 9.5* 9.7* 9.6*  HCT 30.4* 32.1* 32.1*  MCV 102.4* 103.2* 104.6*  MCH 32.0 31.2 31.3  MCHC 31.3 30.2 29.9*  RDW 17.4* 18.0* 18.6*  PLT 116* 122* 126*    Cardiac EnzymesNo results for input(s): TROPONINI in the last 168 hours. No results for input(s): TROPIPOC in the last 168 hours.   BNP Recent Labs  Lab 07/20/20 0629 07/21/20 0700 07/22/20 0328  BNP 1,560.3* 1,382.8* 1,402.1*     DDimer No results for input(s): DDIMER in the last 168 hours.   Radiology    No results found.  Telemetry    07/24/20 ST with rates in the low 100's - Personally Reviewed  ECG    No new tracing as of 07/24/20- Personally Reviewed  Cardiac Studies   Echocardiogram 07/19/2020:  1. Left ventricular ejection fraction, by estimation, is 70 to 75%. The  left ventricle has hyperdynamic function. The left ventricle has no  regional wall motion abnormalities. There is mild left ventricular  hypertrophy. Left ventricular diastolic  parameters are consistent with Grade I diastolic dysfunction (impaired  relaxation).  2. Positive Mcconels signs, with severe hypokinesia of  the mid free wall  with sparing/hypercontractile apex. The enlarged RV leads to underfilling  of the LV. . Right ventricular systolic function is severely reduced. The  right ventricular size is  severely enlarged. There is moderately elevated pulmonary artery systolic  pressure.  3. Right atrial size was severely dilated.  4. The mitral valve is normal in structure. No evidence of mitral valve  regurgitation. No evidence of mitral stenosis.  5. The aortic valve has an indeterminant number of cusps. Aortic valve  regurgitation is not visualized. No aortic stenosis is present.  6. Moderate pulmonary HTN with PASP 57 mmHg. PASP may underestimate  severity of true pulmonary vascular resistance given severe RV  dysfunciton.  7. The inferior vena cava is dilated in size with <50% respiratory  variability, suggesting right atrial pressure of 15 mmHg.   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  - No cystic structure found in the popliteal fossa.    LEFT:  - Findings consistent with acute deep vein thrombosis involving the left  femoral vein, and left popliteal vein.  - No cystic structure found in the popliteal fossa.   Patient Profile     69 y.o. 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 Algis Liming  Assessment & Plan    1. Atrial fibrillation/atrial flutter with RVR: -Previously seen by cardiology in 2017 in the setting of acute pulmonary embolism with ectopic atrial tachycardia which essentially resolved spontaneously to NSR without treatment. At that time, patient was noted to be on home Labetolol which  has since been discontinued. Unfortunately on hospital presentation, CTA with sub massive saddle PE and LE dopplers with acute left femoral/popliteal DVT.  -Continue current regimen for acute pulmonary embolism>>transisitoned today to SQ Lovenox per hem/onc -Hemoglobin, 9.6 today which appears to be at her baseline without acute change -  Hydrated 07/23/20 with response>>>now in SR/ST>>HR's ranging from the 60's>>low 100's   -Can consider repeat fluid bolus today>>MD to determine  -AF exacerbated by low volume with RV strain secondary to massive PE. -Continue to hold off on beta blocker for now. -Would not diurese at this point  -Cardiology follow response closely   2. Acute PE (pulmonary embolism) and RLE DVT: -CT on hospital presentation with acute submassive saddle PE with severe right heart strain.  -Echocardiogram is as above -Lower extremity Dopplers to reveal right lower extremity DVT in the femoral and popliteal veins.   3. Thrombocytopenia: -Likely in the setting of lymphoma  -Platelet counts are low, 126 today  -Admission plts at 73 -Heme/Onc following closely   4. Essential HTN -Stable, 125/74>137/92>113/68 -Holding Lasix, labetolol   5. Hyperlipidemia  -Continue home medications  6. B-Cell lymphoma: -Followed OP with [facility] for chemotherapy -Oncology on board  -Appreciate assistance   Signed, Kathyrn Drown NP-C [facility] Pager: [phone] 07/24/2020, 9:06 AM    For questions or updates, please contact   Please consult [email or web address] for contact info under Cardiology/STEMI.  History and all data above reviewed.  Patient examined.  I agree with the findings as above.  She denies any chest pain or SOB.  N The patient exam reveals COR:RRR  ,  Lungs: Clear  ,  Abd: Positive bowel sounds, no rebound no guarding, Ext no edema  .  All available labs, radiology testing, previous records reviewed. Agree with documented assessment and plan.  Atrial fib:  She will be on  Lovenox for at least a month for her PE and then possibly a DOAC.  Her afib has been transient and she has responded to volume loading.  I encouraged continued PO volume loading.   As she recovers from the PE this might be less likely to happen.  If she has documented PAF with rapid rates as an out patient we could consider starting beta blocker or calcium blocker as she will be beyond any risk of hemodynamic collapse.    Jeneen Rinks Farin Buhman  1:07 PM  07/24/2020

## 2020-07-24 NOTE — Hospital Course (Addendum)
Ms. Convery is a 69 year old female with PMH of diffuse large B cell lymphoma being treated at [facility], pulmonary embolism in 2017 treated with ~6 months of Xarelto, prediabetes, HTN, HLD and endometriosis who presented to the [facility] on 8/26 with reports of diarrhea, lightheadedness and a fall.   CTA chest in ED showed large volume bilateral pulmonary embolism, including a saddle embolus, RV strain with ratio >3, initiated on IV heparin and CCM admitted the patient to the hospital.  Care transferred to [facility] on 8/28.  Hematology consulting.  Course complicated by A. fib with RVR, Cardiology was consulted. She was transitioned from a heparin drip to Lovenox on 07/24/2020.  She will continue on Lovenox at discharge and possibly transition to a DOAC after approximately 1 month.  Her A. fib with RVR responded to volume loading during hospitalization.  Per cardiology, if she has recurrent RVR outpatient, could consider initiation at that time of beta-blocker or CCB.  She is still receiving outpatient chemo with 2 more cycles left. Next treatment is 08/02/20 (q3 week treatment) with Dr. Jolayne Haines at [place] [facility].

## 2020-07-24 NOTE — Progress Notes (Signed)
Occupational Therapy Treatment Patient Details Name: Lauren Santana MRN: [id number] DOB: 06-25-1951 Today's Date: 07/24/2020    History of present illness 69 yo female admitted with PE, DVT. Hx of lymphoma, L TKA 2015, PE, T10/T11 fractures   OT comments  Pt very pleasant and willing to participate!  Follow Up Recommendations  Home health OT;SNF (depending on progress)    Equipment Recommendations  None recommended by OT    Recommendations for Other Services      Precautions / Restrictions Precautions Precautions: Fall Precaution Comments: monitor HR       Mobility Bed Mobility Overal bed mobility: Needs Assistance Bed Mobility: Supine to Sit     Supine to sit: [facility] elevated;Min assist     General bed mobility comments: Increased time.  Transfers Overall transfer level: Needs assistance Equipment used: Rolling walker (2 wheeled) Transfers: Sit to/from [facility] Sit to Stand: Min assist;From elevated surface Stand pivot transfers: Min assist            Balance Overall balance assessment: Needs assistance         Standing balance support: Bilateral upper extremity supported Standing balance-Leahy Scale: Poor                             ADL either performed or assessed with clinical judgement   ADL Overall ADL's : Needs assistance/impaired Eating/Feeding: Set up;Sitting   Grooming: Set up;Sitting   Upper Body Bathing: Minimal assistance;Sitting   Lower Body Bathing: Sit to/from stand;Cueing for safety;Cueing for sequencing;Maximal assistance           Toilet Transfer: Moderate assistance;RW;Cueing for sequencing;Cueing for safety;BSC   Toileting- Clothing Manipulation and Hygiene: Moderate assistance;Sit to/from stand       Functional mobility during ADLs: Moderate assistance;Rolling walker General ADL Comments: pt agreeable to SNF               Cognition Arousal/Alertness: Awake/alert Behavior  During Therapy: WFL for tasks assessed/performed Overall Cognitive Status: Within Functional Limits for tasks assessed                                                     Pertinent Vitals/ Pain       Pain Assessment: No/denies pain     Prior Functioning/Environment              Frequency  Min 2X/week        Progress Toward Goals  OT Goals(current goals can now be found in the care plan section)  Progress towards OT goals: Progressing toward goals  ADL Goals Pt Will Perform Grooming: standing;with supervision Pt Will Perform Lower Body Dressing: with supervision;sit to/from stand Pt Will Transfer to Toilet: with supervision;ambulating Pt Will Perform Toileting - Clothing Manipulation and hygiene: with supervision;sit to/from stand  Plan      Co-evaluation                 AM-PAC OT "6 Clicks" Daily Activity     Outcome Measure   Help from another person eating meals?: None Help from another person taking care of personal grooming?: None Help from another person toileting, which includes using toliet, bedpan, or urinal?: A Little Help from another person bathing (including washing, rinsing, drying)?: A Lot Help from another person to put on  and taking off regular upper body clothing?: A Little Help from another person to put on and taking off regular lower body clothing?: A Lot 6 Click Score: 18    End of Session Equipment Utilized During Treatment: Rolling walker  OT Visit Diagnosis: Unsteadiness on feet (R26.81);Repeated falls (R29.6)   Activity Tolerance Patient tolerated treatment well   Patient Left in chair;with family/visitor present   Nurse Communication Mobility status        Time: 1040-1105 OT Time Calculation (min): 25 min  Charges: OT General Charges $OT Visit: 1 Visit OT Treatments $Self Care/Home Management : 23-37 mins  [name], [place] [phone] Office- [phone]      Adin, [name] 07/24/2020, 4:05 PM

## 2020-07-24 NOTE — Assessment & Plan Note (Signed)
-   chronic; considered in setting of underlying lymphoma - monitor PLTC but seem to be improving

## 2020-07-24 NOTE — TOC Progression Note (Signed)
Transition of Care [facility]) - Progression Note    Patient Details  Name: Lauren Santana MRN: [id number] Date of Birth: October 06, 1951  Transition of [facility]) CM/SW Contact  Shade Flood, LCSW Phone Number: 07/24/2020, 4:26 PM  Clinical Narrative:     TOC following. Working with pt and her son throughout the day to arrange SNF rehab. Was working towards [facility] and then Scientist, physiological at [facility] declined to take pt once it was determined that she would be getting chemo in clinic at [facility] on 9/9.  Pt does have HTA auth for 7 days at [facility]. Auth number is [id number]. She also has auth from HTA for EMS transport to SNF. Auth number is [id number].  Pt and son to decide what they want to do. Anticipate dc to SNF on 9/1.  Assigned TOC will follow.   Expected Discharge Plan: [place] Barriers to Discharge: Continued Medical Work up  Expected Discharge Plan and Services Expected Discharge Plan: [place] In-house Referral: Clinical Social Work   Post Acute Care Choice: [place] Living arrangements for the past 2 months: Apartment                                       Social Determinants of Health (SDOH) Interventions    Readmission Risk Interventions Readmission Risk Prevention Plan 07/23/2020  Transportation Screening Complete  Medication Review (RN CM) Complete  Some recent data might be hidden

## 2020-07-24 NOTE — Progress Notes (Signed)
PROGRESS NOTE    NOLYN EILERT   HQI:[id number]  DOB: [birth year]  DOA: 07/19/2020     5  PCP: Lucianne Lei, MD  CC: fall, lightheadedness  Hospital Course: Ms. Woodin is a 69 year old female with PMH of diffuse large B cell lymphoma being treated at [facility], pulmonary embolism in 2017 treated with ~6 months of Xarelto, prediabetes, HTN, HLD and endometriosis who presented to the [facility] on 8/26 with reports of diarrhea, lightheadedness and a fall.   CTA chest in ED showed large volume bilateral pulmonary embolism, including a saddle embolus, RV strain with ratio >3, initiated on IV heparin and CCM admitted the patient to the hospital.  Care transferred to [facility] on 8/28.  Hematology consulting.  Course complicated by A. fib with RVR, Cardiology was consulted. She was transitioned from a heparin drip to Lovenox on 07/24/2020.  She will continue on Lovenox at discharge and possibly transition to a DOAC after approximately 1 month.  Her A. fib with RVR responded to volume loading during hospitalization.  Per cardiology, if she has recurrent RVR outpatient, could consider initiation at that time of beta-blocker or CCB.   Interval History:  Patient is reportedly a little more confused today than she was yesterday. No significant events otherwise overnight. Resting in bed comfortably.  Old records reviewed in assessment of this patient  ROS: Constitutional: negative for chills and fevers, Respiratory: negative for cough and dyspnea on exertion, Cardiovascular: negative for chest pain and Gastrointestinal: negative for abdominal pain  Assessment & Plan: Pulmonary embolism (HCC) CTA chest 8/26 showed large volume pulmonary embolus bilaterally including a saddle embolus with RV strain.  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  2D echo 8/26: LVEF 13-24%, grade 1 diastolic dysfunction, positive McConnell sign with severe  hypokinesia of the mid free wall with sparing/hypercontractile apex, severely reduced RV systolic function. Troponin peaked to 218 but has gradually reduced to 65.  Troponin elevation likely secondary to demand ischemia.  As per PCCM/hematology input, not a candidate for lytic treatment but only as last resort - now s/p IV heparin x5 days and have transitioned to Lovenox.  Hemodynamically stable and not hypoxic. - per cardiology, possible DOAC after 4 weeks Lovenox  Acute deep vein thrombosis (DVT) of right lower extremity (HCC) LEV Dopplers show chronic right popliteal vein DVT and acute left femoral and popliteal vein DVTs. -Continue Lovenox, see PE  Essential hypertension, benign -Hemodynamically stable without medication at this time  Atrial fibrillation with RVR ([place]) - asymptomatic; considered 2/2 strain from PE - continue anticoagulation as noted - cardiology following, appreciate assistance; has responded to volume loading - per cardiology if RVR outpatient could consider BB/CCB at that time as would be considered lower risk at that time if needed  Thrombocytopenia ([place]) - chronic; considered in setting of underlying lymphoma - monitor PLTC but seem to be improving   Anemia of chronic disease - baseline Hgb 9-10 g/dL and remains stable  Dyslipidemia - continue statin  Chronic pain - continue home oxycodone   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[place]) - continue outpatient care with [facility]   Antimicrobials: None  DVT prophylaxis: Lovenox Code Status: Full Family Communication: None present Disposition Plan: Status is: Inpatient  Remains inpatient appropriate because:Unsafe d/c plan, IV treatments appropriate due to intensity of illness or inability to take PO and Inpatient level of care appropriate due to severity of illness   Dispo: The patient  is from: Home              Anticipated d/c is to: SNF              Anticipated d/c date is: 1 day              Patient  currently is not medically stable to d/c.       Objective: Blood pressure 114/66, pulse 99, temperature 98.1 F (36.7 C), temperature source Oral, resp. rate 18, height 5\' 1"  (1.549 m), weight 85.2 kg, SpO2 99 %.  Examination: General appearance: alert, cooperative and no distress Head: Normocephalic, without obvious abnormality, atraumatic Eyes: EOMI Lungs: clear to auscultation bilaterally Heart: irregularly irregular rhythm and S1, S2 normal Abdomen: normal findings: bowel sounds normal and soft, non-tender Extremities: No edema Skin: mobility and turgor normal Neurologic: Grossly normal  Consultants:   Cardiology  Oncology  Procedures:   07/19/2020, TTE  07/19/2020, lower extremity duplex  Data Reviewed: I have personally reviewed following labs and imaging studies Results for orders placed or performed during the hospital encounter of 07/19/20 (from the past 24 hour(s))  CBC     Status: Abnormal   Collection Time: 07/24/20  4:05 AM  Result Value Ref Range   WBC 7.1 4.0 - 10.5 K/uL   RBC 3.07 (L) 3.87 - 5.11 MIL/uL   Hemoglobin 9.6 (L) 12.0 - 15.0 g/dL   HCT 32.1 (L) 36 - 46 %   MCV 104.6 (H) 80.0 - 100.0 fL   MCH 31.3 26.0 - 34.0 pg   MCHC 29.9 (L) 30.0 - 36.0 g/dL   RDW 18.6 (H) 11.5 - 15.5 %   Platelets 126 (L) 150 - 400 K/uL   nRBC 1.4 (H) 0.0 - 0.2 %  Basic metabolic panel     Status: Abnormal   Collection Time: 07/24/20  4:05 AM  Result Value Ref Range   Sodium 135 135 - 145 mmol/L   Potassium 4.6 3.5 - 5.1 mmol/L   Chloride 105 98 - 111 mmol/L   CO2 22 22 - 32 mmol/L   Glucose, Bld 105 (H) 70 - 99 mg/dL   BUN 10 8 - 23 mg/dL   Creatinine, Ser 0.71 0.44 - 1.00 mg/dL   Calcium 8.7 (L) 8.9 - 10.3 mg/dL   GFR calc non Af Amer >60 >60 mL/min   GFR calc Af Amer >60 >60 mL/min   Anion gap 8 5 - 15    Recent Results (from the past 240 hour(s))  SARS Coronavirus 2 by RT PCR (hospital order, performed in [place] hospital lab) Nasopharyngeal  Nasopharyngeal Swab     Status: None   Collection Time: 07/19/20  1:40 PM   Specimen: Nasopharyngeal Swab  Result Value Ref Range Status   SARS Coronavirus 2 NEGATIVE NEGATIVE Final    Comment: (NOTE) SARS-CoV-2 target nucleic acids are NOT DETECTED.  The SARS-CoV-2 RNA is generally detectable in upper and lower respiratory specimens during the acute phase of infection. The lowest concentration of SARS-CoV-2 viral copies this assay can detect is 250 copies / mL. A negative result does not preclude SARS-CoV-2 infection and should not be used as the sole basis for treatment or other patient management decisions.  A negative result may occur with improper specimen collection / handling, submission of specimen other than nasopharyngeal swab, presence of viral mutation(s) within the areas targeted by this assay, and inadequate number of viral copies (<250 copies / mL). A negative result must be combined with  clinical observations, patient history, and epidemiological information.  Fact Sheet for Patients:   StrictlyIdeas.no  Fact Sheet for Healthcare Providers: BankingDealers.co.za  This test is not yet approved or  cleared by the [place] FDA and has been authorized for detection and/or diagnosis of SARS-CoV-2 by FDA under an Emergency Use Authorization (EUA).  This EUA will remain in effect (meaning this test can be used) for the duration of the COVID-19 declaration under Section 564(b)(1) of the Act, 21 U.S.C. section 360bbb-3(b)(1), unless the authorization is terminated or revoked sooner.  Performed at [facility], [place] [address] [place], [place] [address]      Radiology Studies: No results found. DG CHEST PORT 1 VIEW  Final Result    DG Chest Port 1 View  Final Result    VAS [place] LOWER EXTREMITY VENOUS (DVT)  Final Result    CT Angio Chest PE W and/or Wo Contrast  Final Result    DG Knee  Complete 4 Views Left  Final Result      Scheduled Meds: . atorvastatin  10 mg Oral QPM  . Chlorhexidine Gluconate Cloth  6 each Topical Daily  . enoxaparin (LOVENOX) injection  1 mg/kg Subcutaneous Q12H  . gabapentin  300 mg Oral TID  . medroxyPROGESTERone  10 mg Oral Daily  . ondansetron  4 mg Oral BID  . oxyCODONE-acetaminophen  1 tablet Oral QID  . pantoprazole  40 mg Oral Daily  . potassium chloride  10 mEq Oral BID  . prochlorperazine  5 mg Oral Q12H  . sodium chloride flush  10-40 mL Intracatheter Q12H  . sucralfate  1 g Oral TID AC   PRN Meds: docusate sodium, polyethylene glycol, sodium chloride flush Continuous Infusions:    LOS: 5 days  Time spent: Greater than 50% of the 35 minute visit was spent in counseling/coordination of care for the patient as laid out in the A&P.   Dwyane Dee, MD [facility] 07/24/2020, 2:38 PM  Contact via secure chat.  To contact the attending provider between 7A-7P or the covering provider during after hours 7P-7A, please log into the web site [email or web address] and access using universal [place] password for that web site. If you do not have the password, please call the hospital operator.

## 2020-07-24 NOTE — Assessment & Plan Note (Signed)
-   asymptomatic; considered 2/2 strain from PE - continue anticoagulation as noted - cardiology following, appreciate assistance; has responded to volume loading - per cardiology if RVR outpatient could consider BB/CCB at that time as would be considered lower risk at that time if needed

## 2020-07-24 NOTE — Assessment & Plan Note (Addendum)
-   continue outpatient care with [facility] - patient reportedly has 2 cycles left; next cycle with [facility] on 9/9; follows with Dr. Jolayne Haines

## 2020-07-24 NOTE — Assessment & Plan Note (Signed)
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 -Continue Lovenox, see PE

## 2020-07-24 NOTE — Assessment & Plan Note (Signed)
-  Hemodynamically stable without medication at this time

## 2020-07-24 NOTE — Assessment & Plan Note (Signed)
-   continue home oxycodone

## 2020-07-24 NOTE — Progress Notes (Signed)
Overall, Lauren Santana is about the same.  I think she did walk a little bit yesterday.  She still is without oxygen.  She has pretty stable vital signs.  I think we can now switch her over to Lovenox.  This will help improve her ambulatory ability.  We will treat her with twice daily dosing of Lovenox.  She seems to be eating okay.  There is no nausea or vomiting.  She has had no bleeding.  There has been no problems with pain.  Her labs show white cell count 7.1.  Hemoglobin 9.6.  Platelet count 126,000.  Her BUN is 10 creatinine 0.71.  I do not know if she needs another echocardiogram to see what the right ventricle is doing.  I will leave this up to cardiology or pulmonary medicine or whoever is following this aspect of her health care.  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  Her vital signs show temperature 97.5.  Pulse is 62.  Blood pressure 125/74.  Oxygen saturation on room air is 98%.  Her lungs show good breath sounds bilaterally.  She has good air movement bilaterally.  There is no wheezes.  Cardiac exam regular rate and rhythm with no murmurs, rubs or bruits.  Abdomen is soft.  She has decent bowel sounds.  There is no palpable liver or spleen tip.  Extremities shows no clubbing, cyanosis or edema.  I cannot palpate any venous cord in her legs.  Neurological exam is nonfocal.  Lauren Santana has the saddle embolism.  She has a cor pulmonale.  She had markedly elevated beta natruretic peptide.  She is going be transitioned over to Lovenox now.  Hopefully, she will be able to be discharged after another day or so.  We need to make sure that she and her family know how to administer the Lovenox as an outpatient.  [name], MD  Darlyn Chamber 17:14

## 2020-07-24 NOTE — Assessment & Plan Note (Signed)
continue statin

## 2020-07-24 NOTE — Assessment & Plan Note (Signed)
CTA chest 8/26 showed large volume pulmonary embolus bilaterally including a saddle embolus with RV strain.  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  2D echo 8/26: LVEF 54-00%, grade 1 diastolic dysfunction, positive McConnell sign with severe hypokinesia of the mid free wall with sparing/hypercontractile apex, severely reduced RV systolic function. Troponin peaked to 218 but has gradually reduced to 65.  Troponin elevation likely secondary to demand ischemia.  As per PCCM/hematology input, not a candidate for lytic treatment but only as last resort - now s/p IV heparin x5 days and have transitioned to Lovenox.  Hemodynamically stable and not hypoxic. - per cardiology, possible DOAC after 4 weeks Lovenox

## 2020-07-25 ENCOUNTER — Encounter (HOSPITAL_COMMUNITY): Payer: Self-pay | Admitting: Internal Medicine

## 2020-07-25 ENCOUNTER — Telehealth: Payer: Self-pay | Admitting: Physician Assistant

## 2020-07-25 DIAGNOSIS — E785 Hyperlipidemia, unspecified: Secondary | ICD-10-CM | POA: Diagnosis not present

## 2020-07-25 DIAGNOSIS — I2699 Other pulmonary embolism without acute cor pulmonale: Secondary | ICD-10-CM | POA: Diagnosis not present

## 2020-07-25 DIAGNOSIS — D4989 Neoplasm of unspecified behavior of other specified sites: Secondary | ICD-10-CM | POA: Diagnosis not present

## 2020-07-25 DIAGNOSIS — D638 Anemia in other chronic diseases classified elsewhere: Secondary | ICD-10-CM | POA: Diagnosis not present

## 2020-07-25 DIAGNOSIS — G8929 Other chronic pain: Secondary | ICD-10-CM | POA: Diagnosis not present

## 2020-07-25 DIAGNOSIS — I4891 Unspecified atrial fibrillation: Secondary | ICD-10-CM | POA: Diagnosis not present

## 2020-07-25 DIAGNOSIS — D696 Thrombocytopenia, unspecified: Secondary | ICD-10-CM | POA: Diagnosis not present

## 2020-07-25 DIAGNOSIS — Z7401 Bed confinement status: Secondary | ICD-10-CM | POA: Diagnosis not present

## 2020-07-25 DIAGNOSIS — I2602 Saddle embolus of pulmonary artery with acute cor pulmonale: Secondary | ICD-10-CM | POA: Diagnosis not present

## 2020-07-25 DIAGNOSIS — M170 Bilateral primary osteoarthritis of knee: Secondary | ICD-10-CM | POA: Diagnosis not present

## 2020-07-25 DIAGNOSIS — I82401 Acute embolism and thrombosis of unspecified deep veins of right lower extremity: Secondary | ICD-10-CM | POA: Diagnosis not present

## 2020-07-25 DIAGNOSIS — G894 Chronic pain syndrome: Secondary | ICD-10-CM | POA: Diagnosis not present

## 2020-07-25 DIAGNOSIS — E538 Deficiency of other specified B group vitamins: Secondary | ICD-10-CM | POA: Diagnosis not present

## 2020-07-25 DIAGNOSIS — M6281 Muscle weakness (generalized): Secondary | ICD-10-CM | POA: Diagnosis not present

## 2020-07-25 DIAGNOSIS — M2550 Pain in unspecified joint: Secondary | ICD-10-CM | POA: Diagnosis not present

## 2020-07-25 DIAGNOSIS — R609 Edema, unspecified: Secondary | ICD-10-CM | POA: Diagnosis not present

## 2020-07-25 DIAGNOSIS — M25562 Pain in left knee: Secondary | ICD-10-CM | POA: Diagnosis not present

## 2020-07-25 DIAGNOSIS — N809 Endometriosis, unspecified: Secondary | ICD-10-CM | POA: Diagnosis not present

## 2020-07-25 DIAGNOSIS — I480 Paroxysmal atrial fibrillation: Secondary | ICD-10-CM | POA: Diagnosis not present

## 2020-07-25 DIAGNOSIS — C8330 Diffuse large B-cell lymphoma, unspecified site: Secondary | ICD-10-CM | POA: Diagnosis not present

## 2020-07-25 DIAGNOSIS — M25561 Pain in right knee: Secondary | ICD-10-CM | POA: Diagnosis not present

## 2020-07-25 DIAGNOSIS — I10 Essential (primary) hypertension: Secondary | ICD-10-CM

## 2020-07-25 DIAGNOSIS — G5793 Unspecified mononeuropathy of bilateral lower limbs: Secondary | ICD-10-CM | POA: Diagnosis not present

## 2020-07-25 DIAGNOSIS — I82411 Acute embolism and thrombosis of right femoral vein: Secondary | ICD-10-CM | POA: Diagnosis not present

## 2020-07-25 DIAGNOSIS — S22088D Other fracture of T11-T12 vertebra, subsequent encounter for fracture with routine healing: Secondary | ICD-10-CM | POA: Diagnosis not present

## 2020-07-25 DIAGNOSIS — R531 Weakness: Secondary | ICD-10-CM | POA: Diagnosis not present

## 2020-07-25 LAB — BASIC METABOLIC PANEL
Anion gap: 7 (ref 5–15)
BUN: 8 mg/dL (ref 8–23)
CO2: 22 mmol/L (ref 22–32)
Calcium: 8.6 mg/dL — ABNORMAL LOW (ref 8.9–10.3)
Chloride: 108 mmol/L (ref 98–111)
Creatinine, Ser: 0.66 mg/dL (ref 0.44–1.00)
GFR calc Af Amer: >60 mL/min (ref >60)
GFR calc non Af Amer: >60 mL/min (ref >60)
Glucose, Bld: 94 mg/dL (ref 70–99)
Potassium: 4.5 mmol/L (ref 3.5–5.1)
Sodium: 137 mmol/L (ref 135–145)

## 2020-07-25 LAB — CBC
HCT: 30.2 % — ABNORMAL LOW (ref 36.0–46.0)
Hemoglobin: 9.4 g/dL — ABNORMAL LOW (ref 12.0–15.0)
MCH: 31.9 pg (ref 26.0–34.0)
MCHC: 31.1 g/dL (ref 30.0–36.0)
MCV: 102.4 fL — ABNORMAL HIGH (ref 80.0–100.0)
Platelets: 119 10*3/uL — ABNORMAL LOW (ref 150–400)
RBC: 2.95 MIL/uL — ABNORMAL LOW (ref 3.87–5.11)
RDW: 18.6 % — ABNORMAL HIGH (ref 11.5–15.5)
WBC: 6.7 10*3/uL (ref 4.0–10.5)
nRBC: 0.6 % — ABNORMAL HIGH (ref 0.0–0.2)

## 2020-07-25 LAB — MAGNESIUM: Magnesium: 1.9 mg/dL (ref 1.7–2.4)

## 2020-07-25 MED ORDER — ENOXAPARIN SODIUM 80 MG/0.8ML ~~LOC~~ SOLN: 80.0000 mg | Freq: Two times a day (BID) | SUBCUTANEOUS | Status: DC

## 2020-07-25 MED ORDER — OXYCODONE-ACETAMINOPHEN 7.5-325 MG PO TABS
1.0000 | ORAL_TABLET | Freq: Four times a day (QID) | ORAL | 0 refills | Status: DC | PRN
Start: 2020-07-25 — End: 2020-08-21

## 2020-07-25 MED ORDER — HEPARIN SOD (PORK) LOCK FLUSH 100 UNIT/ML IV SOLN
500.0000 [IU] | INTRAVENOUS | Status: AC | PRN
  Administered 2020-07-25: 500 [IU]
  Filled 2020-07-25: qty 5

## 2020-07-25 MED ORDER — ONDANSETRON HCL 4 MG PO TABS
4.0000 mg | ORAL_TABLET | Freq: Two times a day (BID) | ORAL | 0 refills | Status: DC | PRN
Start: 2020-07-25 — End: 2022-11-01

## 2020-07-25 NOTE — TOC Progression Note (Signed)
Transition of Care [facility]) - Progression Note    Patient Details  Name: Lauren Santana MRN: [id number] Date of Birth: [birth year]  Transition of [facility] CM/SW Contact  Dechelle Attaway, Juliann Pulse, RN Phone Number: 07/25/2020, 10:01 AM  Clinical Narrative: [facility] unable to provide transoprtation for otpt chemo infusion.[facility] rep Kenyada Dosch-will confirm if able to manage transportation, if so they can accept for d/c today. Son/patient/MD agree to plan.covid 8/31 neg.      Expected Discharge Plan: Skilled Nursing Facility Barriers to Discharge: Other (comment) (Awaiting [facility] if able to manage otpt chemo infusion transportation.)  Expected Discharge Plan and Services Expected Discharge Plan: [place] In-house Referral: Clinical Social Work   Post Acute Care Choice: [place] Living arrangements for the past 2 months: Apartment                                       Social Determinants of Health (SDOH) Interventions    Readmission Risk Interventions Readmission Risk Prevention Plan 07/23/2020  Transportation Screening Complete  Medication Review (RN CM) Complete  Some recent data might be hidden

## 2020-07-25 NOTE — Discharge Summary (Signed)
Physician Discharge Summary  [name] DPO:[id number] DOB: 1951/03/10 DOA: 07/19/2020  PCP: Lucianne Lei, MD  Admit date: 07/19/2020 Discharge date: 07/25/2020  Admitted From: home Disposition:  SNF Discharging physician: Dwyane Dee, MD  Recommendations for Outpatient Follow-up:  1. Patient has next chemo appt on 9/9 with [facility]  Patient discharged to SNF in Discharge Condition: stable CODE STATUS: Full Diet recommendation:  Diet Orders (From admission, onward)    Start     Ordered   07/20/20 0914  Diet Heart Room service appropriate? Yes; Fluid consistency: Thin  Diet effective now       Question Answer Comment  Room service appropriate? Yes   Fluid consistency: Thin      07/20/20 0918          Hospital Course: Lauren Santana is a 69 year old female with PMH of diffuse large B cell lymphoma being treated at [facility], pulmonary embolism in 2017 treated with ~6 months of Xarelto, prediabetes, HTN, HLD and endometriosis who [facility] with reports of diarrhea, lightheadedness and a fall.   CTA chest in ED showed large volume bilateral pulmonary embolism, including a saddle embolus, RV strain with ratio >3, initiated on IV heparin and CCM admitted the patient to the hospital.  Care transferred to [facility] on 8/28.  Hematology consulting.  Course complicated by A. fib with RVR, Cardiology was consulted. She was transitioned from a heparin drip to Lovenox on 07/24/2020.  She will continue on Lovenox at discharge and possibly transition to a DOAC after approximately 1 month.  Her A. fib with RVR responded to volume loading during hospitalization.  Per cardiology, if she has recurrent RVR outpatient, could consider initiation at that time of beta-blocker or CCB.  She is still receiving outpatient chemo with 2 more cycles left. Next treatment is 08/02/20 (q3 week treatment) with Dr. Jolayne Haines at [place] [facility].    Pulmonary embolism ([place]) CTA chest 8/26  showed large volume pulmonary embolus bilaterally including a saddle embolus with RV strain.  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  2D echo 8/26: LVEF 43-15%, grade 1 diastolic dysfunction, positive McConnell sign with severe hypokinesia of the mid free wall with sparing/hypercontractile apex, severely reduced RV systolic function. Troponin peaked to 218 but has gradually reduced to 65.  Troponin elevation likely secondary to demand ischemia.  As per PCCM/hematology input, not a candidate for lytic treatment but only as last resort - now s/p IV heparin x5 days and have transitioned to Lovenox.  Hemodynamically stable and not hypoxic. - per cardiology, possible DOAC after 4 weeks Lovenox  Acute deep vein thrombosis (DVT) of right lower extremity (HCC) LEV Dopplers show chronic right popliteal vein DVT and acute left femoral and popliteal vein DVTs. -Continue Lovenox, see PE  Essential hypertension, benign -Hemodynamically stable without medication at this time  Atrial fibrillation with RVR ([place]) - asymptomatic; considered 2/2 strain from PE - continue anticoagulation as noted - cardiology following, appreciate assistance; has responded to volume loading - per cardiology if RVR outpatient could consider BB/CCB at that time as would be considered lower risk at that time if needed  Thrombocytopenia ([place]) - chronic; considered in setting of underlying lymphoma - monitor PLTC but seem to be improving   Anemia of chronic disease - baseline Hgb 9-10 g/dL and remains stable  Dyslipidemia - continue statin  Chronic pain - continue home oxycodone   DLBCL (diffuse large B  cell lymphoma) ([place]) - continue outpatient care with [facility] - patient reportedly has 2 cycles left; next cycle with [facility] on 9/9; follows with Dr. Jolayne Haines    The patient's chronic medical conditions were treated accordingly per the patient's home medication regimen  except as noted.  On day of discharge, patient was felt deemed stable for discharge. Patient/family member advised to call PCP or come back to ER if needed.   Discharge Diagnoses:   Principal Diagnosis: Pulmonary embolism [facility])  Active Hospital Problems   Diagnosis Date Noted  . Pulmonary embolism ([place]) 07/19/2020    Priority: High  . Atrial fibrillation with RVR ([place]) 07/24/2020  . Thrombocytopenia ([place]) 07/24/2020  . Anemia of chronic disease 07/24/2020  . Chronic pain 07/24/2020  . 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[place]) 07/24/2020  . Acute deep vein thrombosis (DVT) of right lower extremity ([place]) 02/14/2016  . Obesity (BMI 30-39.9) 08/20/2015  . Essential hypertension, benign 07/23/2014  . Dyslipidemia 07/23/2014    Resolved Hospital Problems  No resolved problems to display.    Discharge Instructions    Increase activity slowly   Complete by: As directed      Allergies as of 07/25/2020      Reactions   Tylenol [acetaminophen] Other (See Comments)   States "seems like eats my stomach"   Sulfa Antibiotics Other (See Comments)   Unknown allergic reaction      Medication List    STOP taking these medications   diclofenac Sodium 1 % Gel Commonly known as: VOLTAREN   furosemide 20 MG tablet Commonly known as: LASIX   labetalol 100 MG tablet Commonly known as: NORMODYNE   Lidocaine 4 % Ptch   prochlorperazine 5 MG tablet Commonly known as: COMPAZINE     TAKE these medications   atorvastatin 10 MG tablet Commonly known as: LIPITOR TAKE ONE TABLET BY MOUTH  ONCE DAILY IN THE EVENING What changed: See the new instructions.   enoxaparin 80 MG/0.8ML injection Commonly known as: LOVENOX Inject 0.8 mLs (80 mg total) into the skin every 12 (twelve) hours.   gabapentin 300 MG capsule Commonly known as: NEURONTIN Take 300 mg by mouth 3 (three) times daily.   medroxyPROGESTERone 10 MG tablet Commonly known as: PROVERA Take 10 mg by mouth every evening.    ondansetron 4 MG tablet Commonly known as: ZOFRAN Take 1 tablet (4 mg total) by mouth 2 (two) times daily as needed for nausea. What changed:   when to take this  reasons to take this   oxyCODONE-acetaminophen 7.5-325 MG tablet Commonly known as: PERCOCET Take 1 tablet by mouth every 6 (six) hours as needed for severe pain. What changed:   when to take this  reasons to take this  Another medication with the same name was removed. Continue taking this medication, and follow the directions you see here.   potassium chloride 10 MEQ tablet Commonly known as: KLOR-CON Take 10 mEq by mouth 2 (two) times daily.   sucralfate 1 GM/10ML suspension Commonly known as: CARAFATE Take 1 g by mouth 3 (three) times daily before meals.       Allergies  Allergen Reactions  . Tylenol [Acetaminophen] Other (See Comments)    States "seems like eats my stomach"  . Sulfa Antibiotics Other (See Comments)    Unknown allergic reaction    Consultations: Oncology Cardiology  Discharge Exam: BP 135/75 (BP Location: Right Arm)   Pulse 90   Temp 98.3 F (36.8 C) (Oral)   Resp 18  Ht 5\' 1"  (1.549 m)   Wt 82.4 kg   LMP  (LMP Unknown)   SpO2 98%   BMI 34.32 kg/m  General appearance: alert, cooperative and no distress Head: Normocephalic, without obvious abnormality, atraumatic Eyes: EOMI Lungs: clear to auscultation bilaterally Heart: irregularly irregular rhythm and S1, S2 normal Abdomen: normal findings: bowel sounds normal and soft, non-tender Extremities: No edema Skin: mobility and turgor normal Neurologic: Grossly normal  The results of significant diagnostics from this hospitalization (including imaging, microbiology, ancillary and laboratory) are listed below for reference.   Microbiology: Recent Results (from the past 240 hour(s))  SARS Coronavirus 2 by RT PCR (hospital order, performed in [facility] hospital lab) Nasopharyngeal Nasopharyngeal Swab     Status: None    Collection Time: 07/19/20  1:40 PM   Specimen: Nasopharyngeal Swab  Result Value Ref Range Status   SARS Coronavirus 2 NEGATIVE NEGATIVE Final    Comment: (NOTE) SARS-CoV-2 target nucleic acids are NOT DETECTED.  The SARS-CoV-2 RNA is generally detectable in upper and lower respiratory specimens during the acute phase of infection. The lowest concentration of SARS-CoV-2 viral copies this assay can detect is 250 copies / mL. A negative result does not preclude SARS-CoV-2 infection and should not be used as the sole basis for treatment or other patient management decisions.  A negative result may occur with improper specimen collection / handling, submission of specimen other than nasopharyngeal swab, presence of viral mutation(s) within the areas targeted by this assay, and inadequate number of viral copies (<250 copies / mL). A negative result must be combined with clinical observations, patient history, and epidemiological information.  Fact Sheet for Patients:   StrictlyIdeas.no  Fact Sheet for Healthcare Providers: BankingDealers.co.za  This test is not yet approved or  cleared by the [place] FDA and has been authorized for detection and/or diagnosis of SARS-CoV-2 by FDA under an Emergency Use Authorization (EUA).  This EUA will remain in effect (meaning this test can be used) for the duration of the COVID-19 declaration under Section 564(b)(1) of the Act, 21 U.S.C. section 360bbb-3(b)(1), unless the authorization is terminated or revoked sooner.  Performed at [facility], [place] [address], [place], [place] [address]   SARS Coronavirus 2 by RT PCR (hospital order, performed in [facility] hospital lab) Nasopharyngeal Nasopharyngeal Swab     Status: None   Collection Time: 07/24/20  2:44 PM   Specimen: Nasopharyngeal Swab  Result Value Ref Range Status   SARS Coronavirus 2 NEGATIVE NEGATIVE Final     Comment: (NOTE) SARS-CoV-2 target nucleic acids are NOT DETECTED.  The SARS-CoV-2 RNA is generally detectable in upper and lower respiratory specimens during the acute phase of infection. The lowest concentration of SARS-CoV-2 viral copies this assay can detect is 250 copies / mL. A negative result does not preclude SARS-CoV-2 infection and should not be used as the sole basis for treatment or other patient management decisions.  A negative result may occur with improper specimen collection / handling, submission of specimen other than nasopharyngeal swab, presence of viral mutation(s) within the areas targeted by this assay, and inadequate number of viral copies (<250 copies / mL). A negative result must be combined with clinical observations, patient history, and epidemiological information.  Fact Sheet for Patients:   StrictlyIdeas.no  Fact Sheet for Healthcare Providers: BankingDealers.co.za  This test is not yet approved or  cleared by the [place] FDA and has been authorized for detection and/or diagnosis of SARS-CoV-2 by  FDA under an Emergency Use Authorization (EUA).  This EUA will remain in effect (meaning this test can be used) for the duration of the COVID-19 declaration under Section 564(b)(1) of the Act, 21 U.S.C. section 360bbb-3(b)(1), unless the authorization is terminated or revoked sooner.  Performed at [facility], [place] [address], [place], [place] [address]      Labs: BNP (last 3 results) Recent Labs    07/20/20 0629 07/21/20 0700 07/22/20 0328  BNP 1,560.3* 1,382.8* 6,720.9*   Basic Metabolic Panel: Recent Labs  Lab 07/19/20 1202 07/20/20 0629 07/21/20 0700 07/24/20 0405 07/25/20 0329  NA 141 138 138 135 137  K 4.1 4.2 4.6 4.6 4.5  CL 109 109 106 105 108  CO2 21* 20* 20* 22 22  GLUCOSE 137* 105* 105* 105* 94  BUN 14 12 12 10 8   CREATININE 0.83 0.69 0.89 0.71 0.66   CALCIUM 9.1 8.6* 8.7* 8.7* 8.6*  MG  --  2.0 2.0  --  1.9  PHOS  --  3.5  --   --   --    Liver Function Tests: Recent Labs  Lab 07/19/20 1202  AST 24  ALT 15  ALKPHOS 86  BILITOT 1.1  PROT 5.7*  ALBUMIN 3.0*   Recent Labs  Lab 07/19/20 1202  LIPASE 18   No results for input(s): AMMONIA in the last 168 hours. CBC: Recent Labs  Lab 07/19/20 1202 07/20/20 0629 07/21/20 0700 07/22/20 0328 07/23/20 0344 07/24/20 0405 07/25/20 0329  WBC 10.4   < > 9.5 8.4 8.2 7.1 6.7  NEUTROABS 7.4  --   --   --   --   --   --   HGB 10.7*   < > 9.7* 9.5* 9.7* 9.6* 9.4*  HCT 33.8*   < > 31.5* 30.4* 32.1* 32.1* 30.2*  MCV 100.6*   < > 101.6* 102.4* 103.2* 104.6* 102.4*  PLT 73*   < > 106* 116* 122* 126* 119*   < > = values in this interval not displayed.   Cardiac Enzymes: No results for input(s): CKTOTAL, CKMB, CKMBINDEX, TROPONINI in the last 168 hours. BNP: Invalid input(s): POCBNP CBG: No results for input(s): GLUCAP in the last 168 hours. D-Dimer No results for input(s): DDIMER in the last 72 hours. Hgb A1c No results for input(s): HGBA1C in the last 72 hours. Lipid Profile No results for input(s): CHOL, HDL, LDLCALC, TRIG, CHOLHDL, LDLDIRECT in the last 72 hours. Thyroid function studies No results for input(s): TSH, T4TOTAL, T3FREE, THYROIDAB in the last 72 hours.  Invalid input(s): FREET3 Anemia work up No results for input(s): VITAMINB12, FOLATE, FERRITIN, TIBC, IRON, RETICCTPCT in the last 72 hours. Urinalysis    Component Value Date/Time   COLORURINE YELLOW 07/20/2020 0100   APPEARANCEUR CLEAR 07/20/2020 0100   LABSPEC <1.005 (L) 07/20/2020 0100   PHURINE 6.0 07/20/2020 0100   GLUCOSEU NEGATIVE 07/20/2020 0100   HGBUR NEGATIVE 07/20/2020 0100   BILIRUBINUR NEGATIVE 07/20/2020 0100   KETONESUR NEGATIVE 07/20/2020 0100   PROTEINUR NEGATIVE 07/20/2020 0100   UROBILINOGEN 0.2 12/26/2016 1611   NITRITE NEGATIVE 07/20/2020 0100   LEUKOCYTESUR NEGATIVE 07/20/2020  0100   Sepsis Labs Invalid input(s): PROCALCITONIN,  WBC,  LACTICIDVEN Microbiology Recent Results (from the past 240 hour(s))  SARS Coronavirus 2 by RT PCR (hospital order, performed in [place] hospital lab) Nasopharyngeal Nasopharyngeal Swab     Status: None   Collection Time: 07/19/20  1:40 PM   Specimen: Nasopharyngeal Swab  Result Value  Ref Range Status   SARS Coronavirus 2 NEGATIVE NEGATIVE Final    Comment: (NOTE) SARS-CoV-2 target nucleic acids are NOT DETECTED.  The SARS-CoV-2 RNA is generally detectable in upper and lower respiratory specimens during the acute phase of infection. The lowest concentration of SARS-CoV-2 viral copies this assay can detect is 250 copies / mL. A negative result does not preclude SARS-CoV-2 infection and should not be used as the sole basis for treatment or other patient management decisions.  A negative result may occur with improper specimen collection / handling, submission of specimen other than nasopharyngeal swab, presence of viral mutation(s) within the areas targeted by this assay, and inadequate number of viral copies (<250 copies / mL). A negative result must be combined with clinical observations, patient history, and epidemiological information.  Fact Sheet for Patients:   StrictlyIdeas.no  Fact Sheet for Healthcare Providers: BankingDealers.co.za  This test is not yet approved or  cleared by the [place] FDA and has been authorized for detection and/or diagnosis of SARS-CoV-2 by FDA under an Emergency Use Authorization (EUA).  This EUA will remain in effect (meaning this test can be used) for the duration of the COVID-19 declaration under Section 564(b)(1) of the Act, 21 U.S.C. section 360bbb-3(b)(1), unless the authorization is terminated or revoked sooner.  Performed at [facility], [place] [address] [place], [place] [address]   SARS Coronavirus 2 by  RT PCR (hospital order, performed in [facility] hospital lab) Nasopharyngeal Nasopharyngeal Swab     Status: None   Collection Time: 07/24/20  2:44 PM   Specimen: Nasopharyngeal Swab  Result Value Ref Range Status   SARS Coronavirus 2 NEGATIVE NEGATIVE Final    Comment: (NOTE) SARS-CoV-2 target nucleic acids are NOT DETECTED.  The SARS-CoV-2 RNA is generally detectable in upper and lower respiratory specimens during the acute phase of infection. The lowest concentration of SARS-CoV-2 viral copies this assay can detect is 250 copies / mL. A negative result does not preclude SARS-CoV-2 infection and should not be used as the sole basis for treatment or other patient management decisions.  A negative result may occur with improper specimen collection / handling, submission of specimen other than nasopharyngeal swab, presence of viral mutation(s) within the areas targeted by this assay, and inadequate number of viral copies (<250 copies / mL). A negative result must be combined with clinical observations, patient history, and epidemiological information.  Fact Sheet for Patients:   StrictlyIdeas.no  Fact Sheet for Healthcare Providers: BankingDealers.co.za  This test is not yet approved or  cleared by the [place] FDA and has been authorized for detection and/or diagnosis of SARS-CoV-2 by FDA under an Emergency Use Authorization (EUA).  This EUA will remain in effect (meaning this test can be used) for the duration of the COVID-19 declaration under Section 564(b)(1) of the Act, 21 U.S.C. section 360bbb-3(b)(1), unless the authorization is terminated or revoked sooner.  Performed at [facility], [place] [address] [place], [place] [address]     Procedures/Studies: CT Angio Chest PE W and/or Wo Contrast  Result Date: 07/19/2020 CLINICAL DATA:  Chest pain.  Question pulmonary embolism. EXAM: CT ANGIOGRAPHY CHEST  WITH CONTRAST TECHNIQUE: Multidetector CT imaging of the chest was performed using the standard protocol during bolus administration of intravenous contrast. Multiplanar CT image reconstructions and MIPs were obtained to evaluate the vascular anatomy. CONTRAST:  79mL OMNIPAQUE IOHEXOL 350 MG/ML SOLN COMPARISON:  None. FINDINGS: Cardiovascular: Pulmonary arterial opacification is excellent. There is large  volume pulmonary embolism affecting both lungs, including a saddle embolus. Diminished perfusion particularly of the left lower lobe. Overall heart size is normal. Right ventricular strain with a ratio of over 3. No aortic atherosclerotic calcification. Mediastinum/Nodes: No mass or adenopathy. Lungs/Pleura: Lungs are clear presently.  No effusions. Upper Abdomen: Negative Musculoskeletal: Curvature and chronic degenerative change. Recent or unhealed compression fracture at T12. This was present on 05/22/2020 but has shown some further collapse. Review of the MIP images confirms the above findings. IMPRESSION: 1. Large volume pulmonary embolism affecting both lungs, including a saddle embolus. Right ventricular strain with a ratio of over 3. 2. Recent or unhealed compression fracture at T12. This was present on 05/22/2020 but has shown some further collapse. 3. Critical Value/emergent results were called by telephone at the time of interpretation on 07/19/2020 at 1:28 pm to provider [facility] , who verbally acknowledged these results. Electronically Signed   By: Nelson Chimes M.D.   On: 07/19/2020 13:33   DG CHEST PORT 1 VIEW  Result Date: 07/21/2020 CLINICAL DATA:  Pulmonary embolism EXAM: PORTABLE CHEST 1 VIEW COMPARISON:  07/20/2020 FINDINGS: There is a right chest wall port a catheter with tip in the cavoatrial junction. Stable cardiomediastinal contours. No pleural effusion or edema. No airspace densities identified. Degenerative type change noted within both glenohumeral joints and both AC joints.  IMPRESSION: No active cardiopulmonary abnormalities. Electronically Signed   By: Kerby Moors M.D.   On: 07/21/2020 06:35   DG Chest Port 1 View  Result Date: 07/20/2020 CLINICAL DATA:  Acute. EXAM: PORTABLE CHEST 1 VIEW COMPARISON:  CT 07/19/2020.  Chest x-ray 05/28/2020. FINDINGS: PowerPort catheter in unchanged position with tip over the right atrium. Stable cardiomegaly. Low lung volumes. No focal infiltrate. No pleural effusion or pneumothorax. Degenerative change thoracic spine and both shoulders. IMPRESSION: Cardiomegaly. No pulmonary venous congestion. No acute pulmonary infiltrate. Electronically Signed   By: Marcello Moores  Register   On: 07/20/2020 06:22   DG Knee Complete 4 Views Left  Result Date: 07/19/2020 CLINICAL DATA:  69 year old female status post fall at home today. Pain and swelling. EXAM: LEFT KNEE - COMPLETE 4+ VIEW COMPARISON:  Left knee series 07/20/2014. FINDINGS: Chronic left total knee arthroplasty. No evidence of joint effusion on the cross-table lateral view. Hardware appears stable and normally aligned. Chronic bony overgrowth at the lateral proximal tibia and fibula which might be ankylosed. No acute osseous abnormality identified. No discrete soft tissue injury. IMPRESSION: Chronic left total knee arthroplasty. No acute fracture or dislocation identified. Electronically Signed   By: Genevie Ann M.D.   On: 07/19/2020 12:51   ECHOCARDIOGRAM COMPLETE  Result Date: 07/19/2020    ECHOCARDIOGRAM REPORT   Patient Name:   Lauren Santana [facility] Date of Exam: 07/19/2020 Medical Rec #:  [id number]          Height:       61.0 in Accession #:    [id number]         Weight:       240.0 lb Date of Birth:  [birth year]         BSA:          2.041 m Patient Age:    69 years           BP:           108/80 mmHg Patient Gender: F                  HR:  108 bpm. Exam Location:  Inpatient Procedure: 2D Echo STAT ECHO Indications:    acute respiratory insufficiency 518.82  History:        Patient has  prior history of Echocardiogram examinations, most                 recent 02/15/2016. Pulmonary embolism, Signs/Symptoms:edema; Risk                 Factors:Dyslipidemia and Hypertension.  Sonographer:    Johny Chess Referring Phys: Noe Gens, L IMPRESSIONS  1. Left ventricular ejection fraction, by estimation, is 70 to 75%. The left ventricle has hyperdynamic function. The left ventricle has no regional wall motion abnormalities. There is mild left ventricular hypertrophy. Left ventricular diastolic parameters are consistent with Grade I diastolic dysfunction (impaired relaxation).  2. Positive Mcconels signs, with severe hypokinesia of the mid free wall with sparing/hypercontractile apex. The enlarged RV leads to underfilling of the LV. . Right ventricular systolic function is severely reduced. The right ventricular size is severely enlarged. There is moderately elevated pulmonary artery systolic pressure.  3. Right atrial size was severely dilated.  4. The mitral valve is normal in structure. No evidence of mitral valve regurgitation. No evidence of mitral stenosis.  5. The aortic valve has an indeterminant number of cusps. Aortic valve regurgitation is not visualized. No aortic stenosis is present.  6. Moderate pulmonary HTN with PASP 57 mmHg. PASP may underestimate severity of true pulmonary vascular resistance given severe RV dysfunciton.  7. The inferior vena cava is dilated in size with <50% respiratory variability, suggesting right atrial pressure of 15 mmHg. FINDINGS  Left Ventricle: Left ventricular ejection fraction, by estimation, is 70 to 75%. The left ventricle has hyperdynamic function. The left ventricle has no regional wall motion abnormalities. The left ventricular internal cavity size was normal in size. There is mild left ventricular hypertrophy. Left ventricular diastolic parameters are consistent with Grade I diastolic dysfunction (impaired relaxation). Right Ventricle: Positive  Mcconels signs, with severe hypokinesia of the mid free wall with sparing/hypercontractile apex. The enlarged RV leads to underfilling of the LV. The right ventricular size is severely enlarged. No increase in right ventricular wall thickness. Right ventricular systolic function is severely reduced. There is moderately elevated pulmonary artery systolic pressure. The tricuspid regurgitant velocity is 3.26 m/s, and with an assumed right atrial pressure of 15 mmHg, the estimated right ventricular systolic pressure is 54.0 mmHg. Left Atrium: Left atrial size was normal in size. Right Atrium: Right atrial size was severely dilated. Pericardium: A small pericardial effusion is present. The pericardial effusion is circumferential. Mitral Valve: The mitral valve is normal in structure. No evidence of mitral valve regurgitation. No evidence of mitral valve stenosis. Tricuspid Valve: The tricuspid valve is normal in structure. Tricuspid valve regurgitation is mild . No evidence of tricuspid stenosis. Aortic Valve: The aortic valve has an indeterminant number of cusps. . There is mild thickening and mild calcification of the aortic valve. Aortic valve regurgitation is not visualized. No aortic stenosis is present. Mild aortic valve annular calcification. There is mild thickening of the aortic valve. There is mild calcification of the aortic valve. Aortic valve mean gradient measures 5.9 mmHg. Aortic valve peak gradient measures 14.0 mmHg. Pulmonic Valve: The pulmonic valve was not well visualized. Pulmonic valve regurgitation is not visualized. No evidence of pulmonic stenosis. Aorta: The aortic root is normal in size and structure. Pulmonary Artery: Moderate pulmonary HTN with PASP 57 mmHg. PASP may underestimate severity of  true pulmonary vascular resistance given severe RV dysfunciton. Venous: The inferior vena cava is dilated in size with less than 50% respiratory variability, suggesting right atrial pressure of 15 mmHg.  IAS/Shunts: No atrial level shunt detected by color flow Doppler.  LEFT VENTRICLE PLAX 2D LVIDd:         3.67 cm Diastology LVIDs:         2.92 cm LV e' lateral:   10.20 cm/s LV PW:         1.08 cm LV E/e' lateral: 4.9 LV IVS:        1.05 cm LV e' medial:    5.87 cm/s                        LV E/e' medial:  8.6  RIGHT VENTRICLE            IVC RV S prime:     5.98 cm/s  IVC diam: 2.52 cm TAPSE (M-mode): 0.9 cm LEFT ATRIUM             Index       RIGHT ATRIUM           Index LA diam:        2.70 cm 1.32 cm/m  RA Area:     22.30 cm LA Vol (A2C):   28.0 ml 13.72 ml/m RA Volume:   77.80 ml  38.12 ml/m LA Vol (A4C):   27.8 ml 13.62 ml/m LA Biplane Vol: 30.0 ml 14.70 ml/m  AORTIC VALVE AV Vmax:           186.77 cm/s AV Vmean:          111.289 cm/s AV VTI:            0.206 m AV Peak Grad:      14.0 mmHg AV Mean Grad:      5.9 mmHg LVOT Vmax:         102.44 cm/s LVOT Vmean:        63.093 cm/s LVOT VTI:          0.141 m LVOT/AV VTI ratio: 0.69  AORTA Ao Root diam: 2.70 cm Ao Asc diam:  2.90 cm MV E velocity: 50.40 cm/s  TRICUSPID VALVE MV A velocity: 85.40 cm/s  TR Peak grad:   42.5 mmHg MV E/A ratio:  0.59        TR Vmax:        326.00 cm/s                             SHUNTS                            Systemic VTI: 0.14 m Carlyle Dolly MD Electronically signed by Carlyle Dolly MD Signature Date/Time: 07/19/2020/6:53:15 PM    Final    VAS [place] LOWER EXTREMITY VENOUS (DVT)  Result Date: 07/20/2020  Lower Venous DVTStudy Indications: Swelling.  Limitations: Body habitus and poor ultrasound/tissue interface. Comparison Study: 03/16/2019- right lower extremity venous duplex: chronic DVT                   right popliteal vein. Performing Technologist: Oliver Hum RVT  Examination Guidelines: A complete evaluation includes B-mode imaging, spectral Doppler, color Doppler, and power Doppler as needed of all accessible portions of each vessel. Bilateral testing is considered an integral part of a complete examination.  Limited examinations for reoccurring indications may be performed as noted. The reflux portion of the exam is performed with the patient in reverse Trendelenburg.  +---------+---------------+---------+-----------+----------+--------------+ RIGHT    CompressibilityPhasicitySpontaneityPropertiesThrombus Aging +---------+---------------+---------+-----------+----------+--------------+ CFV      Full           Yes      Yes                                 +---------+---------------+---------+-----------+----------+--------------+ SFJ      Full                                                        +---------+---------------+---------+-----------+----------+--------------+ FV Prox  Full                                                        +---------+---------------+---------+-----------+----------+--------------+ FV Mid   Full                                                        +---------+---------------+---------+-----------+----------+--------------+ FV DistalFull                                                        +---------+---------------+---------+-----------+----------+--------------+ POP      Partial        Yes      Yes                  Chronic        +---------+---------------+---------+-----------+----------+--------------+ PTV      Full                                                        +---------+---------------+---------+-----------+----------+--------------+ PERO     Full                                                        +---------+---------------+---------+-----------+----------+--------------+   +---------+---------------+---------+-----------+----------+--------------+ LEFT     CompressibilityPhasicitySpontaneityPropertiesThrombus Aging +---------+---------------+---------+-----------+----------+--------------+ CFV      Full           Yes      Yes                                  +---------+---------------+---------+-----------+----------+--------------+ SFJ      Full                                                        +---------+---------------+---------+-----------+----------+--------------+  FV Prox  Full                                                        +---------+---------------+---------+-----------+----------+--------------+ FV Mid   Partial                                      Acute          +---------+---------------+---------+-----------+----------+--------------+ FV DistalNone           No       No                   Acute          +---------+---------------+---------+-----------+----------+--------------+ PFV      Full                                                        +---------+---------------+---------+-----------+----------+--------------+ POP      None           No       No                   Acute          +---------+---------------+---------+-----------+----------+--------------+ PTV                                                   Not visualized +---------+---------------+---------+-----------+----------+--------------+ PERO                                                  Not visualized +---------+---------------+---------+-----------+----------+--------------+   Left Technical Findings: Not visualized segments include PTV, peroneal veins.   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 - No cystic structure found in the popliteal fossa.  LEFT: - Findings consistent with acute deep vein thrombosis involving the left femoral vein, and left popliteal vein. - No cystic structure found in the popliteal fossa.  *See table(s) above for measurements and observations. Electronically signed by Servando Snare MD on 07/20/2020 at 11:22:41 AM.    Final      Time coordinating discharge: Over 30 minutes    Dwyane Dee, MD  [facility] 07/25/2020, 11:48 AM Pager: Secure chat  If 7PM-7AM, please contact night-coverage [email or web address] Password [email or web address]

## 2020-07-25 NOTE — Telephone Encounter (Signed)
     Pt has TOC appt with Fabian Sharp on 08/09/20 11:15 am, scheduled bu Almyra Deforest

## 2020-07-25 NOTE — Progress Notes (Signed)
Overall, Lauren Santana is okay.  She now is on Lovenox.  This is being given twice a day.  Looks like she might go to skilled nursing for little bit.  This certainly sounds like a good idea.  She is out of bed a little bit.  She is eating well.  She did not have any problems with nausea or vomiting.  There is no chest wall pain.  She has had no cough.  She has had no headache.  She will be followed as an outpatient by the oncologist at [facility].  I am sure they will be able to help manage the Lovenox.  Her labs show white cell count 6.7.  Hemoglobin 9.4.  Platelet count 119,000.  Her BUN is 8 creatinine 0.66.  Cardiology has been following her.  I not sure they feel another echocardiogram is needed.  I would think that at some point, an echocardiogram could help assess her right ventricular function.  She has had no problems with bowels or bladder.  She has had no issues with leg swelling.  There are no rashes.  Overall, her physical exam shows vital signs temperature 98.3.  Pulse 90.  Blood pressure 135/75.  Oxygen saturation is 98% on room air.  Her lungs sound clear bilaterally.  Cardiac exam regular rate and rhythm with occasional extra beat.  Abdomen is soft.  She has good bowel sounds.  There is no fluid wave.  Extremities shows no clubbing, cyanosis or edema.  She has good range of motion of her joints.  Neurological exam shows no focal neurological deficits.  Again, it sounds like Lauren Santana will go home today.  She presented with a submassive pulmonary embolus.  There was a question whether or not she needed thrombolytic therapy.  This was a very difficult situation and very difficult decision given that she has underlying malignancy.  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  She has had chemotherapy with a fantastic response.  The heparin has helped.  She now is on Lovenox.  Again, I would think that the [facility] physicians will be able to manage  her outpatient Lovenox dosing and then maybe switch her over to an oral agent at the proper time.  As always, we had a very good prayer today.  I know that she has a very strong faith.  She has gotten great care from all the staff up on [address].  She is very complementary of the staff.  [name], MD  Psalms 71:3

## 2020-07-25 NOTE — Progress Notes (Signed)
Occupational Therapy Progress Note  Patient upset this morning, waiting for breakfast and reports soiled with urine "I stink." Patient agreeable to transfer to recliner to get washed up, min A with rolling walker to recliner with mod A for peri hygiene. Seated patient set up A for UB bathing and max A for LB bathing. Patient min A stand pivot to bedside commode and max A for peri hygiene after bowel movement. Patient continues to show deficits with activity tolerance, standing balance with recommendation for discharge venue listed below to maximize patient safety and independence with self care.    07/25/20 0800  OT Visit Information  Last OT Received On 07/25/20  Assistance Needed +1  History of Present Illness 69 yo female admitted with PE, DVT. Hx of lymphoma, L TKA 2015, PE, T10/T11 fractures  Precautions  Precautions Fall  Precaution Comments monitor HR  Pain Assessment  Pain Assessment No/denies pain  Cognition  Arousal/Alertness Awake/alert  Behavior During Therapy WFL for tasks assessed/performed  Overall Cognitive Status Within Functional Limits for tasks assessed  ADL  Overall ADL's  Needs assistance/impaired  Grooming Wash/dry face;Set up;Sitting  Upper Body Bathing Set up;Sitting  Upper Body Bathing Details (indicate cue type and reason) in recliner  Lower Body Bathing Maximal assistance;Sitting/lateral leans;Sit to/from stand  Lower Body Bathing Details (indicate cue type and reason) to wash peri area, thighs  Upper Body Dressing  Set up;Sitting  Upper Body Dressing Details (indicate cue type and reason) don clean hospital gown  Toilet Transfer Minimal assistance;Cueing for safety;Stand-pivot;BSC  Toilet Transfer Details (indicate cue type and reason) from bed to recliner then recliner to [facility] and back. min A for safety and cues for hand placement   Toileting- Clothing Manipulation and Hygiene Sit to/from stand;Moderate assistance  Toileting - Clothing Manipulation Details  (indicate cue type and reason) patient able to clean front of peri area in standing when getting out of bed soiled with urine, patient require total A for buttock peri care after bowel movement   Functional mobility during ADLs Minimal assistance;Cueing for safety  General ADL Comments patient upset this AM that she hasn't had breakfast and is soiled with urine "I stink" patient agreeable to sponge bathing in recliner with encouragement to perform as much of bathing and peri care as able, patient requiring assistance listed above due to decreased activity tolerance and dynamic balance   Bed Mobility  Overal bed mobility Needs Assistance  Bed Mobility Supine to Sit  Supine to sit Min assist;HOB elevated  General bed mobility comments to mobilize LEs to EOB  Balance  Overall balance assessment Needs assistance  Sitting-balance support Feet supported  Sitting balance-Leahy Scale Fair  Standing balance support Single extremity supported  Standing balance-Leahy Scale Poor  Restrictions  Weight Bearing Restrictions No  Transfers  Overall transfer level Needs assistance  Equipment used None;4-wheeled walker  Transfers Sit to/from Stand;Stand Pivot Transfers  Sit to Stand Min assist  Stand pivot transfers Min assist  General transfer comment patient utilize walker for transfer from bed to recliner, leans heavily onto walker during peri care. patient perform stand pivot from recliner <> BSC without AD and min A for safety   General Comments  General comments (skin integrity, edema, etc.) note HR up to 130 with functional transfer  OT - End of Session  Equipment Utilized During Treatment Rolling walker  Activity Tolerance Patient tolerated treatment well  Patient left in chair;with call bell/phone within reach;with chair alarm set  Nurse Communication Mobility status  OT Assessment/Plan  OT Plan Discharge plan needs to be updated  OT Visit Diagnosis Unsteadiness on feet (R26.81);Repeated falls  (R29.6)  OT Frequency (ACUTE ONLY) Min 2X/week  Follow Up Recommendations SNF  OT Equipment None recommended by OT  AM-PAC OT "6 Clicks" Daily Activity Outcome Measure (Version 2)  Help from another person eating meals? 4  Help from another person taking care of personal grooming? 3  Help from another person toileting, which includes using toliet, bedpan, or urinal? 2  Help from another person bathing (including washing, rinsing, drying)? 2  Help from another person to put on and taking off regular upper body clothing? 3  Help from another person to put on and taking off regular lower body clothing? 2  6 Click Score 16  OT Goal Progression  Progress towards OT goals Progressing toward goals  Acute Rehab OT Goals  Patient Stated Goal be able to go home  OT Goal Formulation With patient  Time For Goal Achievement 08/07/20  ADL Goals  Pt Will Perform Grooming standing;with supervision  Pt Will Perform Lower Body Dressing with supervision;sit to/from stand  Pt Will Transfer to Toilet with supervision;ambulating  Pt Will Perform Toileting - Clothing Manipulation and hygiene with supervision;sit to/from stand  OT Time Calculation  OT Start Time (ACUTE ONLY) 0811  OT Stop Time (ACUTE ONLY) 0843  OT Time Calculation (min) 32 min  OT General Charges  $OT Visit 1 Visit  OT Treatments  $Self Care/Home Management  23-37 mins   Delbert Phenix OT OT pager: [phone]

## 2020-07-25 NOTE — TOC Transition Note (Addendum)
Transition of Care [facility]) - CM/SW Discharge Note   Patient Details  Name: Lauren Santana MRN: [id number] Date of Birth: [birth year]  [facility] CM/SW Contact:  Dessa Phi, RN Phone Number: 07/25/2020, 10:25 AM   Clinical Narrative: 11:33a-[facility] accepted-rm#113,tel# for report [phone] will receive otpt chemo infusion q3 weeks-2 more cycles left,9/9 next treatment @ [place]. [place] will transport to otpt [facility] for treament.   Awaiting [place] to provide rm,nsg report tel # [phone]-[facility] rep Juliann Pulse able to accept. [facility] for transport.       Barriers to Discharge: No Barriers Identified   Patient Goals and CMS Choice Patient states their goals for this hospitalization and ongoing recovery are:: get better CMS Medicare.gov Compare Post Acute Care list provided to:: Patient Represenative (must comment) Choice offered to / list presented to : Adult Children  Discharge Placement              Patient chooses bed at: [facility] Patient to be transferred to facility by: [place] Name of family member notified: [name] son Patient and family notified of of transfer: 07/25/20  Discharge Plan and Services In-house Referral: Clinical Social Work   Post Acute Care Choice: [place]                               Social Determinants of Health (SDOH) Interventions     Readmission Risk Interventions Readmission Risk Prevention Plan 07/23/2020  Transportation Screening Complete  Medication Review (RN CM) Complete  Some recent data might be hidden

## 2020-07-25 NOTE — Progress Notes (Addendum)
Progress Note  Patient Name: Lauren Santana Date of Encounter: 07/25/2020  [facility] Cardiologist: Dr. Percival Spanish  Subjective   Denies any CP or SOB.  Inpatient Medications    Scheduled Meds: . atorvastatin  10 mg Oral QPM  . Chlorhexidine Gluconate Cloth  6 each Topical Daily  . enoxaparin (LOVENOX) injection  1 mg/kg Subcutaneous Q12H  . gabapentin  300 mg Oral TID  . medroxyPROGESTERone  10 mg Oral Daily  . ondansetron  4 mg Oral BID  . oxyCODONE-acetaminophen  1 tablet Oral QID  . pantoprazole  40 mg Oral Daily  . potassium chloride  10 mEq Oral BID  . prochlorperazine  5 mg Oral Q12H  . sodium chloride flush  10-40 mL Intracatheter Q12H  . sucralfate  1 g Oral TID AC   Continuous Infusions:  PRN Meds: docusate sodium, polyethylene glycol, sodium chloride flush   Vital Signs    Vitals:   07/24/20 1306 07/24/20 2013 07/24/20 2014 07/25/20 0533  BP: 114/66  121/69 135/75  Pulse: 99 88 90 90  Resp: 18 18  18   Temp: 98.1 F (36.7 C) 97.8 F (36.6 C)  98.3 F (36.8 C)  TempSrc: Oral Oral  Oral  SpO2: 99% 100%  98%  Weight:    82.4 kg  Height:        Intake/Output Summary (Last 24 hours) at 07/25/2020 0948 Last data filed at 07/25/2020 0600 Gross per 24 hour  Intake 1090 ml  Output 545 ml  Net 545 ml   Last 3 Weights 07/25/2020 07/24/2020 07/23/2020  Weight (lbs) 181 lb 10.5 oz 187 lb 13.3 oz 185 lb 6.5 oz  Weight (kg) 82.4 kg 85.2 kg 84.1 kg      Telemetry    Sinus tachycardia with HR 90-100s - Personally Reviewed  ECG    Atrial fibrillation with RVR - Personally Reviewed  Physical Exam   GEN: No acute distress.   Neck: No JVD Cardiac: RRR, no murmurs, rubs, or gallops.  Respiratory: Clear to auscultation bilaterally. GI: Soft, nontender, non-distended  MS: trace lower extremity edema; No deformity. Neuro:  Nonfocal  Psych: Normal affect   Labs    High Sensitivity Troponin:   Recent Labs  Lab 07/19/20 1950 07/20/20 0100  07/20/20 0629 07/20/20 1517 07/21/20 0700  TROPONINIHS 173* 138* 111* 100* 65*      Chemistry Recent Labs  Lab 07/19/20 1202 07/20/20 0629 07/21/20 0700 07/24/20 0405 07/25/20 0329  NA 141   < > 138 135 137  K 4.1   < > 4.6 4.6 4.5  CL 109   < > 106 105 108  CO2 21*   < > 20* 22 22  GLUCOSE 137*   < > 105* 105* 94  BUN 14   < > 12 10 8   CREATININE 0.83   < > 0.89 0.71 0.66  CALCIUM 9.1   < > 8.7* 8.7* 8.6*  PROT 5.7*  --   --   --   --   ALBUMIN 3.0*  --   --   --   --   AST 24  --   --   --   --   ALT 15  --   --   --   --   ALKPHOS 86  --   --   --   --   BILITOT 1.1  --   --   --   --   GFRNONAA >60   < > >60 >  60 >60  GFRAA >60   < > >60 >60 >60  ANIONGAP 11   < > 12 8 7    < > = values in this interval not displayed.     Hematology Recent Labs  Lab 07/23/20 0344 07/24/20 0405 07/25/20 0329  WBC 8.2 7.1 6.7  RBC 3.11* 3.07* 2.95*  HGB 9.7* 9.6* 9.4*  HCT 32.1* 32.1* 30.2*  MCV 103.2* 104.6* 102.4*  MCH 31.2 31.3 31.9  MCHC 30.2 29.9* 31.1  RDW 18.0* 18.6* 18.6*  PLT 122* 126* 119*    BNP Recent Labs  Lab 07/20/20 0629 07/21/20 0700 07/22/20 0328  BNP 1,560.3* 1,382.8* 1,402.1*     DDimer No results for input(s): DDIMER in the last 168 hours.   Radiology    No results found.  Cardiac Studies   Echo 07/19/2020 1. Left ventricular ejection fraction, by estimation, is 70 to 75%. The  left ventricle has hyperdynamic function. The left ventricle has no  regional wall motion abnormalities. There is mild left ventricular  hypertrophy. Left ventricular diastolic  parameters are consistent with Grade I diastolic dysfunction (impaired  relaxation).  2. Positive Mcconels signs, with severe hypokinesia of the mid free wall  with sparing/hypercontractile apex. The enlarged RV leads to underfilling  of the LV. . Right ventricular systolic function is severely reduced. The  right ventricular size is  severely enlarged. There is moderately elevated  pulmonary artery systolic  pressure.  3. Right atrial size was severely dilated.  4. The mitral valve is normal in structure. No evidence of mitral valve  regurgitation. No evidence of mitral stenosis.  5. The aortic valve has an indeterminant number of cusps. Aortic valve  regurgitation is not visualized. No aortic stenosis is present.  6. Moderate pulmonary HTN with PASP 57 mmHg. PASP may underestimate  severity of true pulmonary vascular resistance given severe RV  dysfunciton.  7. The inferior vena cava is dilated in size with <50% respiratory  variability, suggesting right atrial pressure of 15 mmHg.   Patient Profile     69 y.o. 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   Assessment & Plan    1. Paroxysmal atrial fibrillation  - converted to sinus tachycardia. Likely related to the stress from PE.   - patient will need long term anticoagulation for recurrent PE, but suspect recurrence of PAF will be less once PE resolves.   - planning to be discharged to SNF. Will arrange follow up in 3-4 weeks  2. Acute PE and RLE DVT  - previous PE in 2017, recurrent PE saddle embolus confirmed on CT with sign of RV strain  - Echo 07/19/2020 EF 70-75%, grade 1 DD, enlarged RV, severe hypokinesis of RV free wall, severely reduced RVEF.   - will defer to Dr. Percival Spanish to consider repeat echocardiogram to look at RV strain in a few weeks  3. Thrombocytopenia: platelet 119 today  4. HTN: home lasix and labetalol discontinued to avoid decrease preload with acute PE  5. HLD: on lipitor  6. B-Cell lymphoma     For questions or updates, please contact [place] Please consult [email or web address] for contact info under     Signed, Almyra Deforest, [place]  07/25/2020, 9:48 AM    The patient was discharged before I could see her.   Follow up is arranged.  She can have a  follow up echo  ordered after that appt.

## 2020-07-25 NOTE — Progress Notes (Signed)
Physical Therapy Treatment Patient Details Name: Lauren Santana MRN: [id number] DOB: 1951/11/24 Today's Date: 07/25/2020    History of Present Illness 69 yo female admitted with PE, DVT. Hx of lymphoma, L TKA 2015, PE, T10/T11 fractures    PT Comments    Assisted OOB to amb to bathroom.  Assisted in bathroom then back to bed.    Follow Up Recommendations  SNF     Equipment Recommendations  None recommended by PT    Recommendations for Other Services       Precautions / Restrictions Precautions Precautions: Fall Restrictions Weight Bearing Restrictions: No    Mobility  Bed Mobility Overal bed mobility: Needs Assistance Bed Mobility: Sit to Supine       Sit to supine: Mod assist   General bed mobility comments: assisted b LE up onto bed and assisted with scooting to [facility]  Transfers Overall transfer level: Needs assistance Equipment used: None Transfers: Sit to/from [facility] Sit to Stand: Min assist Stand pivot transfers: Min assist;Mod assist       General transfer comment: 25% VC's on safety with turns and assisted with hygiene after toilet use a pt was unable  Ambulation/Gait Ambulation/Gait assistance: Min assist Gait Distance (Feet): 18 Feet (9 feet x 2 to and from bathroom) Assistive device: Rolling walker (2 wheeled) Gait Pattern/deviations: Step-through pattern;Decreased stride length;Trunk flexed Gait velocity: decreased   General Gait Details: limited distance to and from bathroom due to fatigue/effort   Stairs             Wheelchair Mobility    Modified Rankin (Stroke Patients Only)       Balance                                            Cognition Arousal/Alertness: Awake/alert Behavior During Therapy: WFL for tasks assessed/performed Overall Cognitive Status: Within Functional Limits for tasks assessed                                 General Comments: AxO x 3 pleasant       Exercises      General Comments        Pertinent Vitals/Pain Pain Assessment: No/denies pain    Home Living                      Prior Function            PT Goals (current goals can now be found in the care plan section) Progress towards PT goals: Progressing toward goals    Frequency    Min 3X/week      PT Plan Current plan remains appropriate    Co-evaluation              AM-PAC PT "6 Clicks" Mobility   Outcome Measure  Help needed turning from your back to your side while in a flat bed without using bedrails?: A Little Help needed moving from lying on your back to sitting on the side of a flat bed without using bedrails?: A Little Help needed moving to and from a bed to a chair (including a wheelchair)?: A Little Help needed standing up from a chair using your arms (e.g., wheelchair or bedside chair)?: A Little Help needed to walk in hospital room?: A Lot  Help needed climbing 3-5 steps with a railing? : Total 6 Click Score: 15    End of Session Equipment Utilized During Treatment: Gait belt Activity Tolerance: Patient limited by fatigue Patient left: in bed;with call bell/phone within reach;with bed alarm set Nurse Communication: Mobility status PT Visit Diagnosis: Muscle weakness (generalized) (M62.81);Difficulty in walking, not elsewhere classified (R26.2)     Time: 8099-8338 PT Time Calculation (min) (ACUTE ONLY): 18 min  Charges:  $Gait Training: 8-22 mins                     Rica Koyanagi  PTA Acute  Rehabilitation Services Pager      [phone] Office      [phone]

## 2020-07-26 DIAGNOSIS — G894 Chronic pain syndrome: Secondary | ICD-10-CM | POA: Diagnosis not present

## 2020-07-26 DIAGNOSIS — D638 Anemia in other chronic diseases classified elsewhere: Secondary | ICD-10-CM | POA: Diagnosis not present

## 2020-07-26 DIAGNOSIS — I480 Paroxysmal atrial fibrillation: Secondary | ICD-10-CM | POA: Diagnosis not present

## 2020-07-26 DIAGNOSIS — I2602 Saddle embolus of pulmonary artery with acute cor pulmonale: Secondary | ICD-10-CM | POA: Diagnosis not present

## 2020-07-26 NOTE — Telephone Encounter (Signed)
Patient contacted regarding discharge from  on 07-25-20.  Patient understands to follow up with provider [facility] on 08-09-20 at 11:15 am at [facility]. Patient understands discharge instructions? yes Patient understands medications and regiment? yes Patient understands to bring all medications to this visit?  Ask patient:  Are you enrolled in My Chart yes  If no ask patient if they would like to enroll.   Spoke with patients son, patient is in a rehab facility.

## 2020-08-01 DIAGNOSIS — M170 Bilateral primary osteoarthritis of knee: Secondary | ICD-10-CM | POA: Diagnosis not present

## 2020-08-01 DIAGNOSIS — M25562 Pain in left knee: Secondary | ICD-10-CM | POA: Diagnosis not present

## 2020-08-01 DIAGNOSIS — M25561 Pain in right knee: Secondary | ICD-10-CM | POA: Diagnosis not present

## 2020-08-01 DIAGNOSIS — G8929 Other chronic pain: Secondary | ICD-10-CM | POA: Diagnosis not present

## 2020-08-02 DIAGNOSIS — E538 Deficiency of other specified B group vitamins: Secondary | ICD-10-CM | POA: Diagnosis not present

## 2020-08-02 DIAGNOSIS — C8330 Diffuse large B-cell lymphoma, unspecified site: Secondary | ICD-10-CM | POA: Diagnosis not present

## 2020-08-02 DIAGNOSIS — I2699 Other pulmonary embolism without acute cor pulmonale: Secondary | ICD-10-CM | POA: Diagnosis not present

## 2020-08-05 NOTE — Progress Notes (Deleted)
Cardiology Office Note:    Date:  08/05/2020   ID:  Lauren Santana, DOB 04-May-1951, MRN [id number]  PCP:  Lucianne Lei, MD  Cardiologist:  Minus Breeding, MD   Referring MD: Lucianne Lei, MD   No chief complaint on file. ***  History of Present Illness:    Lauren Santana is a 69 y.o. female with a hx of PE/DVT 2017 and tachcyardia treated with xarelto, diffuse large B-cell lymphoma s/p chemo at [facility], who was recently seen while hospitalized 07/19/20 for evaluation of tachycardia with submassive saddle PE and significant RV strain as well as acute left DVT.  She was thrombocytopenic, 2/ b cell lymphoma, and therefore not a candidate for lysis or thrombectomy. She was treated with IV heparin. Echocardiogram with EF 70-75% wit LV hyperdynamic function, no RWMA, grade 1 DD, positive McConnell sign with severe hypokinesis of the mid free wall with sparing/hypercontractile apex, enlarged RV with severely reduced function. She initially presented with sinus tachycardia but converted to Afib RVR on 07/23/20. Cardiology was consulted. Plan for anticoagulation and gentle hydration. Pt converted to sinus tachycardia and PAF expected to resolve as PE resolves.   She returns today for follow up.      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   Hypertension   Hyperlipidemia - continue statin - non aortic atherosclerosis noted on CT chest    Past Medical History:  Diagnosis Date  . Arthritis   . Edema    B/LLE  . Endometriosis   . High cholesterol   . Hypertension   . Numbness    feet  .  Pre-diabetes   . Pulmonary embolism (HCC)    recurrent, second episode in 06/2020  . Walker as ambulation aid   . Wears dentures   . Wears glasses     Past Surgical History:  Procedure Laterality Date  . ABDOMINAL HYSTERECTOMY     and BSO  . CATARACT EXTRACTION Left 10/2018  . [place]  . COLONOSCOPY    . EYE SURGERY Bilateral    CATARACT SX  . HERNIA REPAIR  2001   incisional  . JOINT REPLACEMENT Left    Knee  . MAXILLARY ANTROSTOMY Right 01/20/2020   Procedure: MAXILLARY ANTROSTOMY  WITH BIOPSY CALDWELL APPROACH;  Surgeon: Jerrell Belfast, MD;  Location: [place];  Service: ENT;  Laterality: Right;  . MULTIPLE TOOTH EXTRACTIONS  2011  . NASAL ENDOSCOPY Right 02/17/2020   Procedure: NASAL ENDOSCOPY WITH RIGHT INFERIOR TURNBINATE REDUCTION;  Surgeon: Jerrell Belfast, MD;  Location: [place];  Service: ENT;  Laterality: Right;  . neck mass removal     "fatty tissue, not cancerous" per pt's son  . SINUS ENDO WITH FUSION Right 01/20/2020   Procedure: SINUS ENDO WITH FUSION WITH BIOSPY;  Surgeon: Jerrell Belfast, MD;  Location: [place];  Service: ENT;  Laterality: Right;  . TOTAL KNEE ARTHROPLASTY Left 07/17/2014   Procedure: TOTAL KNEE ARTHROPLASTY;  Surgeon: Kerin Salen, MD;  Location: [place];  Service: Orthopedics;  Laterality: Left;    Current Medications: No outpatient medications have been marked as taking for the 08/09/20 encounter (Appointment) with Ledora Bottcher, [place].     Allergies:   Tylenol [acetaminophen] and Sulfa antibiotics   Social History   Socioeconomic History  . Marital status: Divorced    Spouse name: Not on file  . Number of children: 2  . Years of education: Not on file  . Highest education level: High school graduate  Occupational History  . Not on file  Tobacco Use  . Smoking status: Never Smoker  . Smokeless tobacco: Never Used  Vaping Use  . Vaping Use: Never used  Substance and Sexual Activity  . Alcohol use: No  . Drug  use: No  . Sexual activity: Not Currently    Comment: Hysterectoomy  Other Topics Concern  . Not on file  Social History Narrative   Lives alone    Right handed   Social Determinants of Health   Financial Resource Strain:   . Difficulty of Paying Living Expenses: Not on file  Food Insecurity:   . Worried About Charity fundraiser in the Last Year: Not on file  . Ran Out of Food in the Last Year: Not on file  Transportation Needs:   . Lack of Transportation (Medical): Not on file  . Lack of Transportation (Non-Medical): Not on file  Physical Activity:   . Days of Exercise per Week: Not on file  . Minutes of Exercise per Session: Not on file  Stress:   . Feeling of Stress : Not on file  Social Connections:   . Frequency of Communication with Friends and Family: Not on file  . Frequency of Social Gatherings with Friends and Family: Not on file  . Attends Religious Services: Not on file  . Active Member of Clubs or Organizations: Not on file  . Attends Archivist Meetings: Not on file  . Marital Status: Not on file     Family History: The patient's ***family history includes Cancer in her sister; Colon cancer in her sister.  ROS:   Please see the history of present illness.    *** All other systems reviewed and are negative.  EKGs/Labs/Other Studies Reviewed:    The following studies were reviewed today:  Echocardiogram 07/19/2020:  1. Left ventricular ejection fraction, by estimation, is 70 to 75%. The  left ventricle has hyperdynamic function. The left ventricle has no  regional wall motion abnormalities. There is mild left ventricular  hypertrophy. Left ventricular diastolic  parameters are consistent with Grade I diastolic dysfunction (impaired  relaxation).  2. Positive Mcconels signs, with severe hypokinesia of the mid free wall  with sparing/hypercontractile apex. The enlarged RV leads to underfilling  of the LV. . Right ventricular systolic  function is severely reduced. The  right ventricular size is  severely enlarged. There is moderately elevated pulmonary artery systolic  pressure.  3. Right atrial size was severely dilated.  4. The mitral valve is normal in structure. No evidence of mitral valve  regurgitation. No evidence of mitral stenosis.  5. The aortic valve has an indeterminant number of cusps. Aortic valve  regurgitation is not visualized. No aortic stenosis is present.  6. Moderate pulmonary HTN with PASP 57 mmHg. PASP may underestimate  severity of true pulmonary vascular resistance given severe RV  dysfunciton.  7. The inferior vena cava is dilated in size with <50% respiratory  variability, suggesting right atrial pressure of 15 mmHg.  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  - No cystic structure found in the popliteal fossa.    LEFT:  - Findings consistent with acute deep vein thrombosis involving the left  femoral vein, and left popliteal vein.  - No cystic structure found in the popliteal fossa.   EKG:  EKG is *** ordered today.  The ekg ordered today demonstrates ***  Recent Labs: 07/19/2020: ALT 15 07/22/2020: B Natriuretic Peptide 1,402.1 07/25/2020: BUN 8; Creatinine, Ser 0.66; Hemoglobin 9.4; Magnesium 1.9; Platelets 119; Potassium 4.5; Sodium 137  Recent Lipid Panel    Component Value Date/Time   CHOL 129 02/13/2016 0455   TRIG 61 02/13/2016 0455   HDL 48 02/13/2016 0455   CHOLHDL 2.7 02/13/2016 0455   VLDL 12 02/13/2016 0455   LDLCALC 69 02/13/2016 0455    Physical Exam:    VS:  LMP  (LMP Unknown)     Wt Readings from Last 3 Encounters:  07/25/20 181 lb 10.5 oz (82.4 kg)  05/28/20 240 lb (108.9 kg)  02/17/20 204 lb (92.5 kg)     GEN: *** Well nourished, well developed in no acute distress HEENT: Normal NECK: No JVD; No carotid bruits LYMPHATICS:  No lymphadenopathy CARDIAC: ***RRR, no murmurs, rubs, gallops RESPIRATORY:  Clear to auscultation without rales, wheezing or rhonchi  ABDOMEN: Soft, non-tender, non-distended MUSCULOSKELETAL:  No edema; No deformity  SKIN: Warm and dry NEUROLOGIC:  Alert and oriented x 3 PSYCHIATRIC:  Normal affect   ASSESSMENT:    No diagnosis found. PLAN:    In order of problems listed above:  No diagnosis found.   Medication Adjustments/Labs and Tests Ordered: Current medicines are reviewed at length with the patient today.  Concerns regarding medicines are outlined above.  No orders of the defined types were placed in this encounter.  No orders of the defined types were placed in this encounter.   Signed, Ledora Bottcher, [place]  08/05/2020 12:17 PM     [facility]

## 2020-08-09 ENCOUNTER — Ambulatory Visit: Payer: PPO | Admitting: Physician Assistant

## 2020-08-09 DIAGNOSIS — M6281 Muscle weakness (generalized): Secondary | ICD-10-CM | POA: Diagnosis not present

## 2020-08-09 DIAGNOSIS — G8929 Other chronic pain: Secondary | ICD-10-CM | POA: Diagnosis not present

## 2020-08-09 DIAGNOSIS — I10 Essential (primary) hypertension: Secondary | ICD-10-CM | POA: Diagnosis not present

## 2020-08-09 DIAGNOSIS — I4891 Unspecified atrial fibrillation: Secondary | ICD-10-CM | POA: Diagnosis not present

## 2020-08-14 ENCOUNTER — Ambulatory Visit (INDEPENDENT_AMBULATORY_CARE_PROVIDER_SITE_OTHER): Payer: PPO | Admitting: Podiatry

## 2020-08-14 ENCOUNTER — Other Ambulatory Visit: Payer: Self-pay

## 2020-08-14 DIAGNOSIS — M79675 Pain in left toe(s): Secondary | ICD-10-CM | POA: Diagnosis not present

## 2020-08-14 DIAGNOSIS — B351 Tinea unguium: Secondary | ICD-10-CM | POA: Diagnosis not present

## 2020-08-14 DIAGNOSIS — M79674 Pain in right toe(s): Secondary | ICD-10-CM

## 2020-08-15 DIAGNOSIS — Z86711 Personal history of pulmonary embolism: Secondary | ICD-10-CM | POA: Diagnosis not present

## 2020-08-15 DIAGNOSIS — H4901 Third [oculomotor] nerve palsy, right eye: Secondary | ICD-10-CM | POA: Diagnosis not present

## 2020-08-15 DIAGNOSIS — E7800 Pure hypercholesterolemia, unspecified: Secondary | ICD-10-CM | POA: Diagnosis not present

## 2020-08-15 DIAGNOSIS — M1990 Unspecified osteoarthritis, unspecified site: Secondary | ICD-10-CM | POA: Diagnosis not present

## 2020-08-15 DIAGNOSIS — Z961 Presence of intraocular lens: Secondary | ICD-10-CM | POA: Diagnosis not present

## 2020-08-15 DIAGNOSIS — R7303 Prediabetes: Secondary | ICD-10-CM | POA: Diagnosis not present

## 2020-08-15 DIAGNOSIS — C8510 Unspecified B-cell lymphoma, unspecified site: Secondary | ICD-10-CM | POA: Diagnosis not present

## 2020-08-15 DIAGNOSIS — Z6841 Body Mass Index (BMI) 40.0 and over, adult: Secondary | ICD-10-CM | POA: Diagnosis not present

## 2020-08-15 DIAGNOSIS — I10 Essential (primary) hypertension: Secondary | ICD-10-CM | POA: Diagnosis not present

## 2020-08-15 DIAGNOSIS — H52203 Unspecified astigmatism, bilateral: Secondary | ICD-10-CM | POA: Diagnosis not present

## 2020-08-15 DIAGNOSIS — Z96652 Presence of left artificial knee joint: Secondary | ICD-10-CM | POA: Diagnosis not present

## 2020-08-15 DIAGNOSIS — D329 Benign neoplasm of meninges, unspecified: Secondary | ICD-10-CM | POA: Diagnosis not present

## 2020-08-15 DIAGNOSIS — S22089D Unspecified fracture of T11-T12 vertebra, subsequent encounter for fracture with routine healing: Secondary | ICD-10-CM | POA: Diagnosis not present

## 2020-08-15 DIAGNOSIS — S22079D Unspecified fracture of T9-T10 vertebra, subsequent encounter for fracture with routine healing: Secondary | ICD-10-CM | POA: Diagnosis not present

## 2020-08-15 DIAGNOSIS — Z9181 History of falling: Secondary | ICD-10-CM | POA: Diagnosis not present

## 2020-08-15 DIAGNOSIS — H04123 Dry eye syndrome of bilateral lacrimal glands: Secondary | ICD-10-CM | POA: Diagnosis not present

## 2020-08-15 DIAGNOSIS — Z86718 Personal history of other venous thrombosis and embolism: Secondary | ICD-10-CM | POA: Diagnosis not present

## 2020-08-15 NOTE — Progress Notes (Signed)
Subjective: 69 y.o. returns the office today for painful, elongated, thickened toenails which she cannot trim herself. Denies any redness or drainage around the nails.  Since her last saw she had pulmonary embolisms in the hospital as well as rehab.  Denies any systemic complaints such as fevers, chills, nausea, vomiting.   PCP: Lucianne Lei, MD  Objective: AAO 3, NAD DP/PT pulses palpable, CRT less than 3 seconds Nails hypertrophic, dystrophic, elongated, brittle, discolored 10. There is tenderness overlying the nails 1-5 bilaterally. There is no surrounding erythema or drainage along the nail sites. Chronic edema to bilateral ankles No open lesions or pre-ulcerative lesions are identified. No pain with calf compression, swelling, warmth, erythema.  Assessment: Patient presents with symptomatic onychomycosis  Plan: -Treatment options including alternatives, risks, complications were discussed -Nails sharply debrided 10 without complication/bleeding. -She is in for diabetic shoes but she is not diabetic.  There was a question previously if she is diabetic or not but states that she has not.  We will have her see Liliane Channel for possible custom shoes given the swelling -Discussed daily foot inspection. If there are any changes, to call the office immediately.  -Follow-up in 3 months or sooner if any problems are to arise. In the meantime, encouraged to call the office with any questions, concerns, changes symptoms.  Celesta Gentile, DPM

## 2020-08-16 ENCOUNTER — Other Ambulatory Visit: Payer: Self-pay

## 2020-08-16 ENCOUNTER — Encounter (HOSPITAL_COMMUNITY): Payer: Self-pay

## 2020-08-16 ENCOUNTER — Inpatient Hospital Stay (HOSPITAL_COMMUNITY)
Admission: EM | Admit: 2020-08-16 | Discharge: 2020-08-25 | DRG: 872 | Disposition: A | Discharge status: Home Health | Payer: PPO | Attending: Student | Admitting: Student

## 2020-08-16 ENCOUNTER — Emergency Department (HOSPITAL_COMMUNITY): Payer: PPO

## 2020-08-16 DIAGNOSIS — D638 Anemia in other chronic diseases classified elsewhere: Secondary | ICD-10-CM | POA: Diagnosis not present

## 2020-08-16 DIAGNOSIS — E1169 Type 2 diabetes mellitus with other specified complication: Secondary | ICD-10-CM | POA: Diagnosis not present

## 2020-08-16 DIAGNOSIS — E785 Hyperlipidemia, unspecified: Secondary | ICD-10-CM | POA: Diagnosis present

## 2020-08-16 DIAGNOSIS — Z90710 Acquired absence of both cervix and uterus: Secondary | ICD-10-CM | POA: Diagnosis not present

## 2020-08-16 DIAGNOSIS — Z90722 Acquired absence of ovaries, bilateral: Secondary | ICD-10-CM

## 2020-08-16 DIAGNOSIS — Z86718 Personal history of other venous thrombosis and embolism: Secondary | ICD-10-CM

## 2020-08-16 DIAGNOSIS — K819 Cholecystitis, unspecified: Secondary | ICD-10-CM | POA: Diagnosis not present

## 2020-08-16 DIAGNOSIS — Z96652 Presence of left artificial knee joint: Secondary | ICD-10-CM | POA: Diagnosis not present

## 2020-08-16 DIAGNOSIS — R109 Unspecified abdominal pain: Secondary | ICD-10-CM | POA: Diagnosis not present

## 2020-08-16 DIAGNOSIS — Z6832 Body mass index (BMI) 32.0-32.9, adult: Secondary | ICD-10-CM

## 2020-08-16 DIAGNOSIS — N809 Endometriosis, unspecified: Secondary | ICD-10-CM | POA: Diagnosis present

## 2020-08-16 DIAGNOSIS — I10 Essential (primary) hypertension: Secondary | ICD-10-CM | POA: Diagnosis not present

## 2020-08-16 DIAGNOSIS — G8929 Other chronic pain: Secondary | ICD-10-CM | POA: Diagnosis present

## 2020-08-16 DIAGNOSIS — G894 Chronic pain syndrome: Secondary | ICD-10-CM | POA: Diagnosis not present

## 2020-08-16 DIAGNOSIS — K810 Acute cholecystitis: Secondary | ICD-10-CM | POA: Diagnosis not present

## 2020-08-16 DIAGNOSIS — R6520 Severe sepsis without septic shock: Secondary | ICD-10-CM | POA: Diagnosis not present

## 2020-08-16 DIAGNOSIS — E119 Type 2 diabetes mellitus without complications: Secondary | ICD-10-CM | POA: Diagnosis not present

## 2020-08-16 DIAGNOSIS — M159 Polyosteoarthritis, unspecified: Secondary | ICD-10-CM | POA: Diagnosis not present

## 2020-08-16 DIAGNOSIS — L89302 Pressure ulcer of unspecified buttock, stage 2: Secondary | ICD-10-CM | POA: Diagnosis present

## 2020-08-16 DIAGNOSIS — K8020 Calculus of gallbladder without cholecystitis without obstruction: Secondary | ICD-10-CM | POA: Diagnosis not present

## 2020-08-16 DIAGNOSIS — R1084 Generalized abdominal pain: Secondary | ICD-10-CM | POA: Diagnosis not present

## 2020-08-16 DIAGNOSIS — Z9049 Acquired absence of other specified parts of digestive tract: Secondary | ICD-10-CM | POA: Diagnosis not present

## 2020-08-16 DIAGNOSIS — E872 Acidosis, unspecified: Secondary | ICD-10-CM

## 2020-08-16 DIAGNOSIS — Z978 Presence of other specified devices: Secondary | ICD-10-CM | POA: Diagnosis not present

## 2020-08-16 DIAGNOSIS — M6281 Muscle weakness (generalized): Secondary | ICD-10-CM | POA: Diagnosis not present

## 2020-08-16 DIAGNOSIS — I480 Paroxysmal atrial fibrillation: Secondary | ICD-10-CM | POA: Diagnosis not present

## 2020-08-16 DIAGNOSIS — S22088D Other fracture of T11-T12 vertebra, subsequent encounter for fracture with routine healing: Secondary | ICD-10-CM | POA: Diagnosis not present

## 2020-08-16 DIAGNOSIS — Z9079 Acquired absence of other genital organ(s): Secondary | ICD-10-CM

## 2020-08-16 DIAGNOSIS — L8990 Pressure ulcer of unspecified site, unspecified stage: Secondary | ICD-10-CM | POA: Insufficient documentation

## 2020-08-16 DIAGNOSIS — Z886 Allergy status to analgesic agent status: Secondary | ICD-10-CM

## 2020-08-16 DIAGNOSIS — E876 Hypokalemia: Secondary | ICD-10-CM | POA: Diagnosis not present

## 2020-08-16 DIAGNOSIS — D72820 Lymphocytosis (symptomatic): Secondary | ICD-10-CM | POA: Diagnosis present

## 2020-08-16 DIAGNOSIS — A419 Sepsis, unspecified organism: Secondary | ICD-10-CM | POA: Diagnosis present

## 2020-08-16 DIAGNOSIS — I119 Hypertensive heart disease without heart failure: Secondary | ICD-10-CM | POA: Diagnosis not present

## 2020-08-16 DIAGNOSIS — J9811 Atelectasis: Secondary | ICD-10-CM | POA: Diagnosis not present

## 2020-08-16 DIAGNOSIS — Z79899 Other long term (current) drug therapy: Secondary | ICD-10-CM

## 2020-08-16 DIAGNOSIS — Z20822 Contact with and (suspected) exposure to covid-19: Secondary | ICD-10-CM | POA: Diagnosis present

## 2020-08-16 DIAGNOSIS — Z981 Arthrodesis status: Secondary | ICD-10-CM

## 2020-08-16 DIAGNOSIS — E669 Obesity, unspecified: Secondary | ICD-10-CM | POA: Diagnosis present

## 2020-08-16 DIAGNOSIS — Z86711 Personal history of pulmonary embolism: Secondary | ICD-10-CM

## 2020-08-16 DIAGNOSIS — K8000 Calculus of gallbladder with acute cholecystitis without obstruction: Secondary | ICD-10-CM | POA: Diagnosis not present

## 2020-08-16 DIAGNOSIS — I4891 Unspecified atrial fibrillation: Secondary | ICD-10-CM | POA: Diagnosis present

## 2020-08-16 DIAGNOSIS — K8001 Calculus of gallbladder with acute cholecystitis with obstruction: Secondary | ICD-10-CM | POA: Diagnosis not present

## 2020-08-16 DIAGNOSIS — D72829 Elevated white blood cell count, unspecified: Secondary | ICD-10-CM | POA: Diagnosis not present

## 2020-08-16 DIAGNOSIS — Z79890 Hormone replacement therapy (postmenopausal): Secondary | ICD-10-CM | POA: Diagnosis not present

## 2020-08-16 DIAGNOSIS — Z882 Allergy status to sulfonamides status: Secondary | ICD-10-CM

## 2020-08-16 DIAGNOSIS — C8330 Diffuse large B-cell lymphoma, unspecified site: Secondary | ICD-10-CM | POA: Diagnosis not present

## 2020-08-16 DIAGNOSIS — A4159 Other Gram-negative sepsis: Principal | ICD-10-CM | POA: Diagnosis present

## 2020-08-16 DIAGNOSIS — I2692 Saddle embolus of pulmonary artery without acute cor pulmonale: Secondary | ICD-10-CM | POA: Diagnosis not present

## 2020-08-16 DIAGNOSIS — Z8572 Personal history of non-Hodgkin lymphomas: Secondary | ICD-10-CM | POA: Diagnosis not present

## 2020-08-16 DIAGNOSIS — R1011 Right upper quadrant pain: Secondary | ICD-10-CM

## 2020-08-16 DIAGNOSIS — R5381 Other malaise: Secondary | ICD-10-CM | POA: Diagnosis not present

## 2020-08-16 DIAGNOSIS — Z934 Other artificial openings of gastrointestinal tract status: Secondary | ICD-10-CM | POA: Diagnosis not present

## 2020-08-16 DIAGNOSIS — I2699 Other pulmonary embolism without acute cor pulmonale: Secondary | ICD-10-CM | POA: Diagnosis present

## 2020-08-16 DIAGNOSIS — Z9221 Personal history of antineoplastic chemotherapy: Secondary | ICD-10-CM

## 2020-08-16 LAB — RESPIRATORY PANEL BY RT PCR (FLU A&B, COVID)
Influenza A by PCR: NEGATIVE
Influenza B by PCR: NEGATIVE
SARS Coronavirus 2 by RT PCR: NEGATIVE

## 2020-08-16 LAB — PROTIME-INR
INR: 1.3 — ABNORMAL HIGH (ref 0.8–1.2)
Prothrombin Time: 15.7 seconds — ABNORMAL HIGH (ref 11.4–15.2)

## 2020-08-16 LAB — CBC
HCT: 40 % (ref 36.0–46.0)
Hemoglobin: 12.2 g/dL (ref 12.0–15.0)
MCH: 30.6 pg (ref 26.0–34.0)
MCHC: 30.5 g/dL (ref 30.0–36.0)
MCV: 100.3 fL — ABNORMAL HIGH (ref 80.0–100.0)
Platelets: 160 10*3/uL (ref 150–400)
RBC: 3.99 MIL/uL (ref 3.87–5.11)
RDW: 14.6 % (ref 11.5–15.5)
WBC: 19.7 10*3/uL — ABNORMAL HIGH (ref 4.0–10.5)
nRBC: 0 % (ref 0.0–0.2)

## 2020-08-16 LAB — COMPREHENSIVE METABOLIC PANEL
ALT: 13 U/L (ref 0–44)
AST: 15 U/L (ref 15–41)
Albumin: 3.1 g/dL — ABNORMAL LOW (ref 3.5–5.0)
Alkaline Phosphatase: 77 U/L (ref 38–126)
Anion gap: 14 (ref 5–15)
BUN: 11 mg/dL (ref 8–23)
CO2: 23 mmol/L (ref 22–32)
Calcium: 9.2 mg/dL (ref 8.9–10.3)
Chloride: 103 mmol/L (ref 98–111)
Creatinine, Ser: 0.68 mg/dL (ref 0.44–1.00)
GFR calc Af Amer: >60 mL/min (ref >60)
GFR calc non Af Amer: >60 mL/min (ref >60)
Glucose, Bld: 93 mg/dL (ref 70–99)
Potassium: 3.7 mmol/L (ref 3.5–5.1)
Sodium: 140 mmol/L (ref 135–145)
Total Bilirubin: 1.4 mg/dL — ABNORMAL HIGH (ref 0.3–1.2)
Total Protein: 6.5 g/dL (ref 6.5–8.1)

## 2020-08-16 LAB — LIPASE, BLOOD: Lipase: 16 U/L (ref 11–51)

## 2020-08-16 LAB — HEMOGLOBIN A1C
Hgb A1c MFr Bld: 4.6 % — ABNORMAL LOW (ref 4.8–5.6)
Mean Plasma Glucose: 85.32 mg/dL

## 2020-08-16 LAB — LACTIC ACID, PLASMA: Lactic Acid, Venous: 3.7 mmol/L (ref 0.5–1.9)

## 2020-08-16 LAB — APTT: aPTT: 29 seconds (ref 24–36)

## 2020-08-16 MED ORDER — SODIUM CHLORIDE 0.9% FLUSH
10.0000 mL | Freq: Two times a day (BID) | INTRAVENOUS | Status: DC
  Administered 2020-08-17: 10 mL

## 2020-08-16 MED ORDER — SODIUM CHLORIDE 0.9% FLUSH
10.0000 mL | INTRAVENOUS | Status: DC | PRN
  Administered 2020-08-25: 10 mL

## 2020-08-16 MED ORDER — SODIUM CHLORIDE 0.9 % IV SOLN
2.0000 g | Freq: Once | INTRAVENOUS | Status: DC
  Filled 2020-08-16: qty 2

## 2020-08-16 MED ORDER — SODIUM CHLORIDE 0.9 % IV SOLN
2.0000 g | Freq: Three times a day (TID) | INTRAVENOUS | Status: DC
  Administered 2020-08-16 – 2020-08-20 (×11): 2 g via INTRAVENOUS
  Filled 2020-08-16 (×12): qty 2

## 2020-08-16 MED ORDER — LACTATED RINGERS IV SOLN: INTRAVENOUS | Status: DC

## 2020-08-16 MED ORDER — ONDANSETRON HCL 4 MG/2ML IJ SOLN
4.0000 mg | Freq: Four times a day (QID) | INTRAMUSCULAR | Status: DC | PRN
  Administered 2020-08-17 – 2020-08-22 (×5): 4 mg via INTRAVENOUS
  Filled 2020-08-16 (×6): qty 2

## 2020-08-16 MED ORDER — ALTEPLASE 2 MG IJ SOLR
2.0000 mg | Freq: Once | INTRAMUSCULAR | Status: AC
  Administered 2020-08-16: 2 mg
  Filled 2020-08-16: qty 2

## 2020-08-16 MED ORDER — METRONIDAZOLE IN NACL 5-0.79 MG/ML-% IV SOLN
500.0000 mg | Freq: Once | INTRAVENOUS | Status: DC
  Filled 2020-08-16: qty 100

## 2020-08-16 MED ORDER — SODIUM CHLORIDE 0.9 % IV SOLN: 2.0000 g | Freq: Three times a day (TID) | INTRAVENOUS | Status: DC

## 2020-08-16 MED ORDER — LACTATED RINGERS IV BOLUS
1000.0000 mL | Freq: Once | INTRAVENOUS | Status: AC
  Administered 2020-08-16: 1000 mL via INTRAVENOUS

## 2020-08-16 MED ORDER — STERILE WATER FOR INJECTION IJ SOLN
INTRAMUSCULAR | Status: AC
  Filled 2020-08-16: qty 10

## 2020-08-16 MED ORDER — ONDANSETRON HCL 4 MG/2ML IJ SOLN
4.0000 mg | Freq: Once | INTRAMUSCULAR | Status: AC
  Administered 2020-08-16: 4 mg via INTRAVENOUS
  Filled 2020-08-16: qty 2

## 2020-08-16 MED ORDER — METRONIDAZOLE IN NACL 5-0.79 MG/ML-% IV SOLN
500.0000 mg | Freq: Three times a day (TID) | INTRAVENOUS | Status: DC
  Administered 2020-08-16 – 2020-08-20 (×11): 500 mg via INTRAVENOUS
  Filled 2020-08-16 (×10): qty 100

## 2020-08-16 MED ORDER — MORPHINE SULFATE (PF) 4 MG/ML IV SOLN
4.0000 mg | Freq: Once | INTRAVENOUS | Status: AC
  Administered 2020-08-16: 4 mg via INTRAVENOUS
  Filled 2020-08-16: qty 1

## 2020-08-16 MED ORDER — HEPARIN (PORCINE) 25000 UT/250ML-% IV SOLN
1150.0000 [IU]/h | INTRAVENOUS | Status: DC
  Administered 2020-08-17: 1150 [IU]/h via INTRAVENOUS
  Filled 2020-08-16: qty 250

## 2020-08-16 MED ORDER — BISACODYL 5 MG PO TBEC: 5.0000 mg | DELAYED_RELEASE_TABLET | Freq: Every day | ORAL | Status: DC | PRN

## 2020-08-16 MED ORDER — ONDANSETRON HCL 4 MG PO TABS
4.0000 mg | ORAL_TABLET | Freq: Four times a day (QID) | ORAL | Status: DC | PRN
  Administered 2020-08-23 – 2020-08-24 (×3): 4 mg via ORAL
  Filled 2020-08-16 (×3): qty 1

## 2020-08-16 MED ORDER — LACTATED RINGERS IV BOLUS (SEPSIS): 500.0000 mL | Freq: Once | INTRAVENOUS | Status: DC

## 2020-08-16 NOTE — Progress Notes (Signed)
ANTICOAGULATION CONSULT NOTE -  Consult  Pharmacy Consult for IV heparin Indication: pulmonary embolus  Allergies  Allergen Reactions  . Tylenol [Acetaminophen] Other (See Comments)    States "seems like eats my stomach"  . Sulfa Antibiotics Other (See Comments)    Unknown allergic reaction    Patient Measurements:   Heparin Dosing Weight: 67.1 kg   Vital Signs: Temp: 99.4 F (37.4 C) (09/23 2138) Temp Source: Oral (09/23 2138) BP: 127/55 (09/23 2138) Pulse Rate: 100 (09/23 2138)  Labs: Recent Labs    08/16/20 1441  HGB 12.2  HCT 40.0  PLT 160  CREATININE 0.68    CrCl cannot be calculated (Unknown ideal weight.).   Medical History: Past Medical History:  Diagnosis Date  . Arthritis   . Edema    B/LLE  . Endometriosis   . High cholesterol   . Hypertension   . Numbness    feet  . Pre-diabetes   . Pulmonary embolism (HCC)    recurrent, second episode in 06/2020  . Walker as ambulation aid   . Wears dentures   . Wears glasses     Medications:  Scheduled:  .  morphine injection  4 mg Intravenous Once  . sodium chloride flush  10-40 mL Intracatheter Q12H    Assessment: Pharmacy is consulted to dose heparin drip in 69 yo female with PMH of PE/DVT.  Pt on Lovenox PTA. Per protocol, no bolus. Infusion also due to stop at 0600 on 9/24 prior to surgery.   Goal of Therapy:  Heparin level 0.3-0.7 units/ml Monitor platelets by anticoagulation protocol: Yes   Plan:   Start heparin IV 1150 units/hr.   Hold heparin drip at 0600 on 9/24.   F/u anticoagulation plans after procedure.   Royetta Asal, PharmD, BCPS 08/16/2020 10:52 PM

## 2020-08-16 NOTE — H&P (Signed)
Lauren Santana is an 69 y.o. female.   Chief Complaint: Abdominal pain, anorexia, nausea and vomiting HPI: The patient is a 69 yr old woman who carries a past medical history significant for Diffuse large B cell lymphoma, History of DVT and PE in 2017 as well as recent (07/19/2020) admission to [facility] for large saddle embolus with RV strain that she obtained while on oral anticoagulation. When she was discharged it was on lovenox. She also has Atrial fibrillation with RVR, although during her recent admission her rate responded to volume loading and required no changes to medication. She states that she last had her chemotherapy a week ago and that she is due for chemotherapy again in a week and a half.   The patient states that she had the presenting symptoms for about a month or more. She states that the symptoms have become progressively worse. She complains of pain in her right upper quadrant.   In the ED she has had a low grade fever, tachycardia, and tachypnea. Labwork demonstrates Lactic acid of 3.7, Anion Gap of 14, and T bili of 1.4. WBC is 19.7 with Hgb of 12.2. CXR has demonstrated low lung volumes and mild bibasilar atelectasis and unchanged cardiomegaly. Abdominal ultrasound demonstrated findings equivocal for acute calculus cholecystitis with gallbladder wall thickening, gallstones, and percholecystic free fluid, but no sonographic Murphy's sign.  The patient has been started on IV Cefepime and Flagyl in the ED. Sepsis protocol has been initiated and the patient has received 2L LR. Gastroenterology and general surgery have been consulted by EDP.  [facility] hospitalists have been consulted to admit the patient for futher evaluation and treatment.  Past Medical History:  Diagnosis Date  . Arthritis   . Edema    B/LLE  . Endometriosis   . High cholesterol   . Hypertension   . Numbness    feet  . Pre-diabetes   . Pulmonary embolism (HCC)    recurrent, second episode in 06/2020  .  Walker as ambulation aid   . Wears dentures   . Wears glasses     Past Surgical History:  Procedure Laterality Date  . ABDOMINAL HYSTERECTOMY     and BSO  . CATARACT EXTRACTION Left 10/2018  . [place]  . COLONOSCOPY    . EYE SURGERY Bilateral    CATARACT SX  . HERNIA REPAIR  2001   incisional  . JOINT REPLACEMENT Left    Knee  . MAXILLARY ANTROSTOMY Right 01/20/2020   Procedure: MAXILLARY ANTROSTOMY  WITH BIOPSY CALDWELL APPROACH;  Surgeon: Jerrell Belfast, MD;  Location: [place];  Service: ENT;  Laterality: Right;  . MULTIPLE TOOTH EXTRACTIONS  2011  . NASAL ENDOSCOPY Right 02/17/2020   Procedure: NASAL ENDOSCOPY WITH RIGHT INFERIOR TURNBINATE REDUCTION;  Surgeon: Jerrell Belfast, MD;  Location: [place];  Service: ENT;  Laterality: Right;  . neck mass removal     "fatty tissue, not cancerous" per pt's son  . SINUS ENDO WITH FUSION Right 01/20/2020   Procedure: SINUS ENDO WITH FUSION WITH BIOSPY;  Surgeon: Jerrell Belfast, MD;  Location: [place];  Service: ENT;  Laterality: Right;  . TOTAL KNEE ARTHROPLASTY Left 07/17/2014   Procedure: TOTAL KNEE ARTHROPLASTY;  Surgeon: Kerin Salen, MD;  Location: [place];  Service: Orthopedics;  Laterality: Left;    Family History  Problem Relation Age of Onset  . Colon cancer Sister   . Cancer Sister        unsure of type  of cancer   Social History:  reports that she has never smoked. She has never used smokeless tobacco. She reports that she does not drink alcohol and does not use drugs. (Not in a hospital admission)   Allergies:  Allergies  Allergen Reactions  . Tylenol [Acetaminophen] Other (See Comments)    States "seems like eats my stomach"  . Sulfa Antibiotics Other (See Comments)    Unknown allergic reaction    Pertinent items noted in HPI and remainder of comprehensive ROS otherwise negative.   General appearance: alert, cooperative, fatigued, mild distress and slowed mentation Head: Normocephalic, without  obvious abnormality, atraumatic Eyes: conjunctivae/corneas clear. PERRL, EOM's intact. Fundi benign. Throat: lips, mucosa, and tongue normal; teeth and gums normal Neck: no adenopathy, no carotid bruit, no JVD, supple, symmetrical, trachea midline and thyroid not enlarged, symmetric, no tenderness/mass/nodules Resp: No increased work of breathing. No wheezes, rales, or rhonchi. No tactile fremitus. Chest wall: no tenderness Cardio: regular rate and rhythm, S1, S2 normal, no murmur, click, rub or gallop GI: Abdomen is soft, but tender in the right upper quadrant. Hypoactive bowel sounds. No organomegaly, masses, or hernias are appreciated. Extremities: extremities normal, atraumatic, no cyanosis or edema Pulses: 2+ and symmetric Skin: Skin color, texture, turgor normal. No rashes or lesions Lymph nodes: Cervical, supraclavicular, and axillary nodes normal. Neurologic: Alert and oriented X 3, normal strength and tone. Normal symmetric reflexes. Normal coordination and gait  Results for orders placed or performed during the hospital encounter of 08/16/20 (from the past 48 hour(s))  Lipase, blood     Status: None   Collection Time: 08/16/20  2:41 PM  Result Value Ref Range   Lipase 16 11 - 51 U/L    Comment: Performed at [facility], [place] [address], [place], [place] [address]  Comprehensive metabolic panel     Status: Abnormal   Collection Time: 08/16/20  2:41 PM  Result Value Ref Range   Sodium 140 135 - 145 mmol/L   Potassium 3.7 3.5 - 5.1 mmol/L   Chloride 103 98 - 111 mmol/L   CO2 23 22 - 32 mmol/L   Glucose, Bld 93 70 - 99 mg/dL    Comment: Glucose reference range applies only to samples taken after fasting for at least 8 hours.   BUN 11 8 - 23 mg/dL   Creatinine, Ser 0.68 0.44 - 1.00 mg/dL   Calcium 9.2 8.9 - 10.3 mg/dL   Total Protein 6.5 6.5 - 8.1 g/dL   Albumin 3.1 (L) 3.5 - 5.0 g/dL   AST 15 15 - 41 U/L   ALT 13 0 - 44 U/L   Alkaline Phosphatase 77 38 -  126 U/L   Total Bilirubin 1.4 (H) 0.3 - 1.2 mg/dL   GFR calc non Af Amer >60 >60 mL/min   GFR calc Af Amer >60 >60 mL/min   Anion gap 14 5 - 15    Comment: Performed at [facility], [place] [address], [place], [place] [address]  CBC     Status: Abnormal   Collection Time: 08/16/20  2:41 PM  Result Value Ref Range   WBC 19.7 (H) 4.0 - 10.5 K/uL   RBC 3.99 3.87 - 5.11 MIL/uL   Hemoglobin 12.2 12.0 - 15.0 g/dL   HCT 40.0 36 - 46 %   MCV 100.3 (H) 80.0 - 100.0 fL   MCH 30.6 26.0 - 34.0 pg   MCHC 30.5 30.0 - 36.0 g/dL   RDW 14.6 11.5 -  15.5 %   Platelets 160 150 - 400 K/uL   nRBC 0.0 0.0 - 0.2 %    Comment: Performed at [facility], [place] [address], [place], [place] [address]  Lactic acid, plasma     Status: Abnormal   Collection Time: 08/16/20  5:15 PM  Result Value Ref Range   Lactic Acid, Venous 3.7 (HH) 0.5 - 1.9 mmol/L    Comment: CRITICAL RESULT CALLED TO, READ BACK BY AND VERIFIED WITH: LOWDERMILK,J. RN @1807  08/16/20 BILLINGSLEY,L Performed at [facility], [place] [address], [place], [place] [address]    @[id number] @  Blood pressure 112/62, pulse (!) 112, temperature 100 F (37.8 C), temperature source Oral, resp. rate 18, SpO2 95 %.    Assessment/Plan Problem  Cholecystitis, Acute  Abdominal Pain  Severe Sepsis (Hcc)  Atrial Fibrillation With Rvr (Hcc)  Anemia of Chronic Disease  Chronic Pain  Dlbcl (Diffuse Large B Cell Lymphoma) (Hcc)  Pulmonary Emboli (Hcc)  Essential Hypertension, Benign   Severe Sepsis: Pt with tachycardia, tachypnea, lactic acidosis, and lymphocytosis. Source of infection is likely gallbladder with worrisome findings on physical exam and abdominal ultrasound. The patient was started on sepsis protocol in the ED and has received 2L LR. She has also been started on cefepime and flagyl. Monitor lactic acid.  Acute calculous cholecystitis/abdominal pain: Although abdominal ultrasound findings  are considered equivocal due to the patient's lack of sonographic Murphy's sign, the patient did have epigastric and right upper quadrant tenderness on my exam. Liver enzymes and sepsis parameters will be monitored. The patient has been started on IV cefepime and flagyl. GI and general surgery have been consulted. At this point General surgery favors percutaneous drainage to operative procedures due to the patient's frailty and recent pulmonary embolus. Lovenox will be held in anticipation of possible procedure tomorrow. Will place the patient on a heparin gtt that will be stopped at 0600 as pre surgery's recommendation. She will also receive pain control and antiemetics.  Diffuse Large B Cell Lymphoma: The patient has completed 4 cyclees of R-mini CHOP. She was due for another treatment on 08/02/2020, but due to her recent illness this was deferred. She was to return to oncology office in 2 weeks.  Pulmonary Emboli: Saddle embolus with RV strain. Pt was on oral anticoagulation at the time of diagnosis and admission. She was discharged on lovenox. Despite the patient's history of recurrent and severe PE's, the patient receives Provera at home. This will not be continued here. Due to possible need for invasive procedure tomorrow, the patient will be placed on a heparin drip which will be stopped at 0600 at surgery's recommendation. She currently does not complain of shortness of breath or chest pain, cough.  Atrial fibrillation with RVR: Rapid heart rate is felt to be due to sepsis. When the patient was admitted to [facility] with saddle embolus she also was found to have AF with RVR, but it responded to volume resuscitation. She has already received 2 L LR in the ED. She will receive another 500 cc. She is not on rate limiting medications at home. She will be monitored on telemetry.  Essential Hypertension: Pt is normotensive currently. She is not on any antihypertensives at home. Monitor.  I have seen and  examined this patient myself. I have spent 82 minutes in her evaluation and care.  DVT prophylaxis: Heparin gtt CODE STATUS: Full Code Family Communication: None. General Surgery spoke with the patient's son. Disposition: The patient will be admitted  as inpatient to a progressive bed.  Status is: Inpatient  Remains inpatient appropriate because:Inpatient level of care appropriate due to severity of illness   Dispo: The patient is from: Home              Anticipated d/c is to: to be determined              Anticipated d/c date is: 3 days              Patient currently is not medically stable to d/c.  Severity of Illness: The appropriate patient status for this patient is INPATIENT. Inpatient status is judged to be reasonable and necessary in order to provide the required intensity of service to ensure the patient's safety. The patient's presenting symptoms, physical exam findings, and initial radiographic and laboratory data in the context of their chronic comorbidities is felt to place them at high risk for further clinical deterioration. Furthermore, it is not anticipated that the patient will be medically stable for discharge from the hospital within 2 midnights of admission. The following factors support the patient status of inpatient.   " The patient's presenting symptoms include pain, nausea, anorexia. " The worrisome physical exam findings include Right upper quadrant pain. " The initial radiographic and laboratory data are worrisome because of Leukocytosis, lactic acid of 3.7, elevated bilirubin and ultrasound findings suggestive of acute calculous cholecystitis. " The chronic co-morbidities include Diffuse large B-Cell Lymphoma, thrombophillia.   * I certify that at the point of admission it is my clinical judgment that the patient will require inpatient hospital care spanning beyond 2 midnights from the point of admission due to high intensity of service, high risk for further  deterioration and high frequency of surveillance required.*  Omaira Mellen 08/16/2020, 8:43 PM

## 2020-08-16 NOTE — Sepsis Progress Note (Signed)
Secure chat with bedside RN regarding blood cultures that have not shown completed in epic. Also checking on LR bolus and repeat lactic acid. I have asked bedside RN to check with lab.

## 2020-08-16 NOTE — Progress Notes (Signed)
PHARMACY NOTE -  Cefepime  Pharmacy has been assisting with dosing of cefepime for IAI.  Dosage remains stable at 2g IV q8 hr and need for further dosage adjustment appears unlikely at present given SCr at baseline  Pharmacy will sign off, following peripherally for culture results or dose adjustments. Please reconsult if a change in clinical status warrants re-evaluation of dosage.  Reuel Boom, PharmD, BCPS [phone] 08/16/2020, 6:42 PM

## 2020-08-16 NOTE — Consult Note (Signed)
Lauren Santana May 28, 1951  [id number].    Requesting MD: ED Chief Complaint/Reason for Consult: abdominal pain/acute cholecystitis  HPI:  Lauren Santana is a 69 yo female who presents to the ED with complaints of right sided abdominal pain. She says the pain has been ongoing for at least the last few days but is unable to give a more specific duration of symptoms. She also endorses nausea and vomiting. She was somewhat confused when I interviewed her, and I obtained much of the history from her son over the phone. He says that for the last few months she has complained of right sided abdominal pain. She is currently being treated at [facility] for diffuse large B cell lymphoma and has completed 4 cycles of chemotherapy (last treatment was about 6 weeks ago). At the end of August she was admitted at [facility] with an acute saddle PE with RV strain, as well as new onset afib. Her afib resolved, and she was discharged on therapeutic lovenox for treatment of the PE. She was discharged to rehab, and her son says she has only been at home for about a week. She has had frequent nausea and vomiting throughout her lymphoma treatment, but those symptoms have worsened in the last few days. She presented to the ED today. Labs are significant for a WBC of 19 and mildly elevated Tbili of 1.4. She is mildly tachycardic but normotensive and afebrile. A RUQ US showed mild gallbladder wall thickening and sludge, and was read as equivocal for cholecystitis.  ROS: Review of Systems  Constitutional: Negative for chills and fever.  Respiratory: Negative for cough.   Cardiovascular: Negative for chest pain.  Gastrointestinal: Positive for abdominal pain and diarrhea.  Neurological: Negative for dizziness and headaches.    Family History  Problem Relation Age of Onset  . Colon cancer Sister   . Cancer Sister        unsure of type of cancer    Past Medical History:  Diagnosis Date  . Arthritis   . Edema     B/LLE  . Endometriosis   . High cholesterol   . Hypertension   . Numbness    feet  . Pre-diabetes   . Pulmonary embolism (HCC)    recurrent, second episode in 06/2020  . Walker as ambulation aid   . Wears dentures   . Wears glasses     Past Surgical History:  Procedure Laterality Date  . ABDOMINAL HYSTERECTOMY     and BSO  . CATARACT EXTRACTION Left 10/2018  . [place]  . COLONOSCOPY    . EYE SURGERY Bilateral    CATARACT SX  . HERNIA REPAIR  2001   incisional  . JOINT REPLACEMENT Left    Knee  . MAXILLARY ANTROSTOMY Right 01/20/2020   Procedure: MAXILLARY ANTROSTOMY  WITH BIOPSY CALDWELL APPROACH;  Surgeon: Lauren Belfast, MD;  Location: [place];  Service: ENT;  Laterality: Right;  . MULTIPLE TOOTH EXTRACTIONS  2011  . NASAL ENDOSCOPY Right 02/17/2020   Procedure: NASAL ENDOSCOPY WITH RIGHT INFERIOR TURNBINATE REDUCTION;  Surgeon: Lauren Belfast, MD;  Location: [place];  Service: ENT;  Laterality: Right;  . neck mass removal     "fatty tissue, not cancerous" per pt's son  . SINUS ENDO WITH FUSION Right 01/20/2020   Procedure: SINUS ENDO WITH FUSION WITH BIOSPY;  Surgeon: Lauren Belfast, MD;  Location: [place];  Service: ENT;  Laterality: Right;  . TOTAL KNEE ARTHROPLASTY Left  07/17/2014   Procedure: TOTAL KNEE ARTHROPLASTY;  Surgeon: Lauren Salen, MD;  Location: [place];  Service: Orthopedics;  Laterality: Left;    Social History:  reports that she has never smoked. She has never used smokeless tobacco. She reports that she does not drink alcohol and does not use drugs.  Allergies:  Allergies  Allergen Reactions  . Tylenol [Acetaminophen] Other (See Comments)    States "seems like eats my stomach"  . Sulfa Antibiotics Other (See Comments)    Unknown allergic reaction    (Not in a hospital admission)    Physical Exam: Blood pressure 117/61, pulse (!) 112, temperature 100 F (37.8 C), temperature source Oral, resp. rate 11, SpO2 94 %. General:  resting in bed, appears uncomfortable HEENT: head is normocephalic, atraumatic.  Sclera are nonicteric. Ears and nose without any masses or lesions. Heart: mildly tachycardic at 110, regular rhythm Lungs: Nonlabored respirations on room air, symmetric chest wall expansion. Abd: soft, nondistended, tender to palpation in RUQ and R flank but otherwise nontender. No rebound tenderness or guarding. No masses or organomegaly. MS: all 4 extremities are symmetrical with no cyanosis, clubbing, or edema. Skin: warm and dry with no masses, lesions, or rashes Neuro: alert, oriented to place and time   Results for orders placed or performed during the hospital encounter of 08/16/20 (from the past 48 hour(s))  Lipase, blood     Status: None   Collection Time: 08/16/20  2:41 PM  Result Value Ref Range   Lipase 16 11 - 51 U/L    Comment: Performed at [facility], [place] [address] [place], [place] [address]  Comprehensive metabolic panel     Status: Abnormal   Collection Time: 08/16/20  2:41 PM  Result Value Ref Range   Sodium 140 135 - 145 mmol/L   Potassium 3.7 3.5 - 5.1 mmol/L   Chloride 103 98 - 111 mmol/L   CO2 23 22 - 32 mmol/L   Glucose, Bld 93 70 - 99 mg/dL    Comment: Glucose reference range applies only to samples taken after fasting for at least 8 hours.   BUN 11 8 - 23 mg/dL   Creatinine, Ser 0.68 0.44 - 1.00 mg/dL   Calcium 9.2 8.9 - 10.3 mg/dL   Total Protein 6.5 6.5 - 8.1 g/dL   Albumin 3.1 (L) 3.5 - 5.0 g/dL   AST 15 15 - 41 U/L   ALT 13 0 - 44 U/L   Alkaline Phosphatase 77 38 - 126 U/L   Total Bilirubin 1.4 (H) 0.3 - 1.2 mg/dL   GFR calc non Af Amer >60 >60 mL/min   GFR calc Af Amer >60 >60 mL/min   Anion gap 14 5 - 15    Comment: Performed at [facility], [place] [address] [place],  [address]  CBC     Status: Abnormal   Collection Time: 08/16/20  2:41 PM  Result Value Ref Range   WBC 19.7 (H) 4.0 - 10.5 K/uL   RBC 3.99 3.87 -  5.11 MIL/uL   Hemoglobin 12.2 12.0 - 15.0 g/dL   HCT 40.0 36 - 46 %   MCV 100.3 (H) 80.0 - 100.0 fL   MCH 30.6 26.0 - 34.0 pg   MCHC 30.5 30.0 - 36.0 g/dL   RDW 14.6 11.5 - 15.5 %   Platelets 160 150 - 400 K/uL   nRBC 0.0 0.0 - 0.2 %    Comment: Performed at [facility], [place] [address], [place], [place] [address]  Lactic acid, plasma     Status: Abnormal   Collection Time: 08/16/20  5:15 PM  Result Value Ref Range   Lactic Acid, Venous 3.7 (HH) 0.5 - 1.9 mmol/L    Comment: CRITICAL RESULT CALLED TO, READ BACK BY AND VERIFIED WITH: LOWDERMILK,J. RN @1807  08/16/20 BILLINGSLEY,L Performed at [facility], [place] [address] [place] [address], [place], [place] [address]    DG Chest Port 1 View  Result Date: 08/16/2020 CLINICAL DATA:  Questionable sepsis - evaluate for abnormality Abdominal pain. EXAM: PORTABLE CHEST 1 VIEW COMPARISON:  Chest radiograph 07/21/2020.  CT 07/19/2020 FINDINGS: Lung volumes are low. Accessed right chest port with tip in the right atrium, unchanged. Similar cardiomegaly. Unchanged mediastinal contours allowing for rotation. Minor subsegmental atelectasis at the lung bases. No confluent airspace disease. No pneumothorax or large pleural effusion. Degenerative change of both shoulders. IMPRESSION: 1. Low lung volumes with mild bibasilar atelectasis. 2. Unchanged cardiomegaly. Electronically Signed   By: Keith Rake M.D.   On: 08/16/2020 18:03   US Abdomen Limited RUQ  Result Date: 08/16/2020 CLINICAL DATA:  Right upper quadrant abdominal pain EXAM: ULTRASOUND ABDOMEN LIMITED RIGHT UPPER QUADRANT COMPARISON:  CT dated May 22, 2020 FINDINGS: Gallbladder: There is cholelithiasis. The gallbladder wall is thickened measuring approximately 5 mm. There is pericholecystic free fluid. The sonographic Percell Miller sign is reported as negative. Common bile duct: Diameter: 5 mm Liver: No focal lesion identified. Within normal limits in parenchymal echogenicity.  Portal vein is patent on color Doppler imaging with normal direction of blood flow towards the liver. Other: None. IMPRESSION: Findings are equivocal for acute calculus cholecystitis. While there is gallbladder wall thickening in the presence of gallstones and pericholecystic free fluid, the sonographic Percell Miller sign is reported as negative. Surgical consultation is recommended. Consider HIDA scan for further evaluation. Electronically Signed   By: Constance Holster M.D.   On: 08/16/2020 17:12      Assessment/Plan 69 yo female with diffuse large B cell lymphoma and recent saddle PE, presenting with RUQ pain and leukocytosis. Given mild wall thickening with some fluid on [place], this is most likely acute cholecystitis, however would recommend ruling out other sources of sepsis given that the pain has been present for months and [place] is equivocal.  - Continue broad-spectrum IV antibiotics - Trend LFTs daily. Bilirubin is only mildly elevated and CBD diameter is normal on [place] so choledocholithiasis is unlikely, however if Tbili continues to trend up would recommend an MRCP. - NPO, IV fluid hydration - If RUQ persists with antibiotics, would favor percutaneous cholecystectomy over a surgical intervention since patient is frail and has multiple medical comorbidities including recent saddle PE with need for ongoing anticoagulation. I discussed this plan with the patient's son [name] via phone, will keep him updated. - Please hold lovenox in case of need for procedure tomorrow. If anticoagulation is needed tonight for recent PE, heparin gtt is ok but should be stopped at 6am in case of need for procedure tomorrow. - Surgery will continue to follow  Dwan Bolt, [facility] 08/16/2020, 8:30 PM

## 2020-08-16 NOTE — Plan of Care (Signed)

## 2020-08-16 NOTE — Sepsis Progress Note (Signed)
Spoke with bedside RN regarding blood cultures. She stated that lab was able to obtain one blood culture and that the patient has been a difficult lab draw. She stated that she was going to start the antibiotics and finish the fluid bolus.

## 2020-08-16 NOTE — ED Notes (Signed)
Date and time results received: 08/16/20 6:07 PM  Test: Lactic Acid Critical Value: 3.7  Name of Provider Notified: Ron Parker, EDP

## 2020-08-16 NOTE — ED Provider Notes (Addendum)
[facility] DEPT Provider Note   CSN: [id number] Arrival date & time: 08/16/20  1355     History Chief Complaint  Patient presents with  . Abdominal Pain    Lauren Santana is a 69 y.o. female.   Abdominal Pain Pain location:  RUQ Pain quality: aching and dull   Pain radiates to:  Does not radiate Pain severity:  Moderate Onset quality:  Gradual Timing:  Constant Progression:  Worsening Relieved by:  Nothing Worsened by:  Nothing Ineffective treatments:  None tried Associated symptoms: anorexia, nausea and vomiting   Associated symptoms: no chest pain, no chills, no cough, no diarrhea, no dysuria, no fever and no shortness of breath        Past Medical History:  Diagnosis Date  . Arthritis   . Edema    B/LLE  . Endometriosis   . High cholesterol   . Hypertension   . Numbness    feet  . Pre-diabetes   . Pulmonary embolism (HCC)    recurrent, second episode in 06/2020  . Walker as ambulation aid   . Wears dentures   . Wears glasses     Patient Active Problem List   Diagnosis Date Noted  . Atrial fibrillation with RVR ([place]) 07/24/2020  . Thrombocytopenia ([place]) 07/24/2020  . Anemia of chronic disease 07/24/2020  . Chronic pain 07/24/2020  . 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[place]) 07/24/2020  . Pulmonary embolism ([place]) 07/19/2020  . 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 S/P BSO (bilateral salpingo-oophorectomy) 01/08/2017  . S/P hysterectomy 01/08/2017  . Tachycardia   . Acute deep vein thrombosis (DVT) of right lower extremity ([place]) 02/14/2016  . Pulmonary emboli ([place]) 02/13/2016  . PE (pulmonary embolism) 02/13/2016  . AKI (acute kidney injury) ([place]) 02/13/2016  . Pulmonary embolism with acute cor pulmonale ([place])   . Pelvic mass in female   . Obesity (BMI 30-39.9) 08/20/2015  . 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 Essential hypertension, benign 07/23/2014  . Edema 07/23/2014  .  Dyslipidemia 07/23/2014  . Lower extremity numbness 07/23/2014  . 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 Postmenopausal bleeding 03/10/2012  . Endometriosis of pelvis 01/22/2007    Past Surgical History:  Procedure Laterality Date  . ABDOMINAL HYSTERECTOMY     and BSO  . CATARACT EXTRACTION Left 10/2018  . [place]  . COLONOSCOPY    . EYE SURGERY Bilateral    CATARACT SX  . HERNIA REPAIR  2001   incisional  . JOINT REPLACEMENT Left    Knee  . MAXILLARY ANTROSTOMY Right 01/20/2020   Procedure: MAXILLARY ANTROSTOMY  WITH BIOPSY CALDWELL APPROACH;  Surgeon: Jerrell Belfast, MD;  Location: [facility];  Service: ENT;  Laterality: Right;  . MULTIPLE TOOTH EXTRACTIONS  2011  . NASAL ENDOSCOPY Right 02/17/2020   Procedure: NASAL ENDOSCOPY WITH RIGHT INFERIOR TURNBINATE REDUCTION;  Surgeon: Jerrell Belfast, MD;  Location: [facility];  Service: ENT;  Laterality: Right;  . neck mass removal     "fatty tissue, not cancerous" per pt's son  . SINUS ENDO WITH FUSION Right 01/20/2020   Procedure: SINUS ENDO WITH FUSION WITH BIOSPY;  Surgeon: Jerrell Belfast, MD;  Location: [facility];  Service: ENT;  Laterality: Right;  . TOTAL KNEE ARTHROPLASTY Left 07/17/2014   Procedure: TOTAL KNEE ARTHROPLASTY;  Surgeon: Kerin Salen, MD;  Location: [facility];  Service: Orthopedics;  Laterality: Left;     OB History  Gravida  4   Para  3   Term  3   Preterm      AB  1   Living  2     SAB  1   TAB      Ectopic      Multiple      Live Births              Family History  Problem Relation Age of Onset  . Colon cancer Sister   . Cancer Sister        unsure of type of cancer    Social History   Tobacco Use  . Smoking status: Never Smoker  . Smokeless tobacco: Never Used  Vaping Use  . Vaping Use: Never used  Substance Use Topics  . Alcohol use: No  . Drug use: No    Home Medications Prior to Admission medications   Medication Sig Start Date End Date Taking? Authorizing  Provider  atorvastatin (LIPITOR) 10 MG tablet TAKE ONE TABLET BY MOUTH  ONCE DAILY IN THE EVENING Patient taking differently: Take 10 mg by mouth every evening.  03/18/16   Gildardo Cranker, DO  enoxaparin (LOVENOX) 80 MG/0.8ML injection Inject 0.8 mLs (80 mg total) into the skin every 12 (twelve) hours. 07/25/20   Dwyane Dee, MD  gabapentin (NEURONTIN) 300 MG capsule Take 300 mg by mouth 3 (three) times daily.    [provider]  medroxyPROGESTERone (PROVERA) 10 MG tablet Take 10 mg by mouth every evening.  01/06/20   [provider]  ondansetron (ZOFRAN) 4 MG tablet Take 1 tablet (4 mg total) by mouth 2 (two) times daily as needed for nausea. 07/25/20   Dwyane Dee, MD  oxyCODONE-acetaminophen (PERCOCET) 7.5-325 MG tablet Take 1 tablet by mouth every 6 (six) hours as needed for severe pain. 07/25/20   Dwyane Dee, MD  potassium chloride (KLOR-CON) 10 MEQ tablet Take 10 mEq by mouth 2 (two) times daily. 06/20/20   [provider]  sucralfate (CARAFATE) 1 GM/10ML suspension Take 1 g by mouth 3 (three) times daily before meals. 01/06/20   [provider]    Allergies    Tylenol [acetaminophen] and Sulfa antibiotics  Review of Systems   Review of Systems  Constitutional: Negative for chills and fever.  HENT: Negative for congestion and rhinorrhea.   Respiratory: Negative for cough and shortness of breath.   Cardiovascular: Negative for chest pain and palpitations.  Gastrointestinal: Positive for abdominal pain, anorexia, nausea and vomiting. Negative for diarrhea.  Genitourinary: Negative for difficulty urinating and dysuria.  Musculoskeletal: Negative for arthralgias and back pain.  Skin: Negative for rash and wound.  Neurological: Negative for light-headedness and headaches.    Physical Exam Updated Vital Signs BP (!) 115/56   Pulse (!) 113   Temp 100 F (37.8 C) (Oral)   Resp (!) 24   LMP  (LMP Unknown)   SpO2 96%   Physical Exam Vitals and  nursing note reviewed. Exam conducted with a chaperone present.  Constitutional:      General: She is not in acute distress.    Appearance: Normal appearance.  HENT:     Head: Normocephalic and atraumatic.     Nose: No rhinorrhea.  Eyes:     General:        Right eye: No discharge.        Left eye: No discharge.     Conjunctiva/sclera: Conjunctivae normal.  Cardiovascular:     Rate and Rhythm: Normal  rate and regular rhythm.  Pulmonary:     Effort: Pulmonary effort is normal. No respiratory distress.     Breath sounds: No stridor.  Abdominal:     General: Abdomen is flat. There is no distension.     Palpations: Abdomen is soft.     Tenderness: There is abdominal tenderness in the right upper quadrant. There is guarding.  Musculoskeletal:        General: No tenderness or signs of injury.  Skin:    General: Skin is warm and dry.  Neurological:     General: No focal deficit present.     Mental Status: She is alert. Mental status is at baseline.     Motor: No weakness.  Psychiatric:        Mood and Affect: Mood normal.        Behavior: Behavior normal.     ED Results / Procedures / Treatments   Labs (all labs ordered are listed, but only abnormal results are displayed) Labs Reviewed  COMPREHENSIVE METABOLIC PANEL - Abnormal; Notable for the following components:      Result Value   Albumin 3.1 (*)    Total Bilirubin 1.4 (*)    All other components within normal limits  CBC - Abnormal; Notable for the following components:   WBC 19.7 (*)    MCV 100.3 (*)    All other components within normal limits  LACTIC ACID, PLASMA - Abnormal; Notable for the following components:   Lactic Acid, Venous 3.7 (*)    All other components within normal limits  CULTURE, BLOOD (SINGLE)  URINE CULTURE  CULTURE, BLOOD (SINGLE)  RESPIRATORY PANEL BY RT PCR (FLU A&B, COVID)  LIPASE, BLOOD  URINALYSIS, ROUTINE W REFLEX MICROSCOPIC  LACTIC ACID, PLASMA  PROTIME-INR  APTT    EKG EKG  Interpretation  Date/Time:  Thursday August 16 2020 16:57:22 EDT Ventricular Rate:  116 PR Interval:    QRS Duration: 83 QT Interval:  408 QTC Calculation: 562 R Axis:   113 Text Interpretation: Sinus tachycardia Right axis deviation Borderline abnrm T, anterolateral leads Prolonged QT interval Artifact in lead(s) I II III aVR aVL aVF V1 V2 Confirmed by Dewaine Conger [phone]) on 08/16/2020 5:03:12 PM   Radiology DG Chest Port 1 View  Result Date: 08/16/2020 CLINICAL DATA:  Questionable sepsis - evaluate for abnormality Abdominal pain. EXAM: PORTABLE CHEST 1 VIEW COMPARISON:  Chest radiograph 07/21/2020.  CT 07/19/2020 FINDINGS: Lung volumes are low. Accessed right chest port with tip in the right atrium, unchanged. Similar cardiomegaly. Unchanged mediastinal contours allowing for rotation. Minor subsegmental atelectasis at the lung bases. No confluent airspace disease. No pneumothorax or large pleural effusion. Degenerative change of both shoulders. IMPRESSION: 1. Low lung volumes with mild bibasilar atelectasis. 2. Unchanged cardiomegaly. Electronically Signed   By: Keith Rake M.D.   On: 08/16/2020 18:03   US Abdomen Limited RUQ  Result Date: 08/16/2020 CLINICAL DATA:  Right upper quadrant abdominal pain EXAM: ULTRASOUND ABDOMEN LIMITED RIGHT UPPER QUADRANT COMPARISON:  CT dated May 22, 2020 FINDINGS: Gallbladder: There is cholelithiasis. The gallbladder wall is thickened measuring approximately 5 mm. There is pericholecystic free fluid. The sonographic Percell Miller sign is reported as negative. Common bile duct: Diameter: 5 mm Liver: No focal lesion identified. Within normal limits in parenchymal echogenicity. Portal vein is patent on color Doppler imaging with normal direction of blood flow towards the liver. Other: None. IMPRESSION: Findings are equivocal for acute calculus cholecystitis. While there is gallbladder wall thickening in the  presence of gallstones and pericholecystic free fluid,  the sonographic Percell Miller sign is reported as negative. Surgical consultation is recommended. Consider HIDA scan for further evaluation. Electronically Signed   By: Constance Holster M.D.   On: 08/16/2020 17:12    Procedures .Critical Care Performed by: Breck Coons, MD Authorized by: Breck Coons, MD   Critical care provider statement:    Critical care time (minutes):  45   Critical care was necessary to treat or prevent imminent or life-threatening deterioration of the following conditions:  Sepsis   Critical care was time spent personally by me on the following activities:  Discussions with consultants, evaluation of patient's response to treatment, examination of patient, ordering and performing treatments and interventions, ordering and review of laboratory studies, ordering and review of radiographic studies, pulse oximetry, re-evaluation of patient's condition, obtaining history from patient or surrogate, review of old charts, blood draw for specimens and development of treatment plan with patient or surrogate   (including critical care time)  Medications Ordered in ED Medications  alteplase (CATHFLO ACTIVASE) injection 2 mg (has no administration in time range)  lactated ringers infusion (has no administration in time range)  ceFEPIme (MAXIPIME) 2 g in sodium chloride 0.9 % 100 mL IVPB (has no administration in time range)  metroNIDAZOLE (FLAGYL) IVPB 500 mg (has no administration in time range)  lactated ringers bolus 1,000 mL (has no administration in time range)  lactated ringers bolus 1,000 mL (1,000 mLs Intravenous New Bag/Given 08/16/20 1636)  ondansetron (ZOFRAN) injection 4 mg (4 mg Intravenous Given 08/16/20 1634)  morphine 4 MG/ML injection 4 mg (4 mg Intravenous Given 08/16/20 1634)    ED Course  I have reviewed the triage vital signs and the nursing notes.  Pertinent labs & imaging results that were available during my care of the patient were reviewed by me and  considered in my medical decision making (see chart for details).    MDM Rules/Calculators/A&P                          69 year old female comes in with poor appetite nausea vomiting abdominal pain for several weeks.  Getting worse.  Hemodynamically shows tachycardia with borderline fever.  Right upper quadrant tenderness she will get ultrasound she will get screening labs.  CBC shows a leukocytosis, elevation in T bili otherwise unremarkable..  Still waiting for urinalysis ultrasound.  Fluids antiemetics and pain medicine given.  Patient is receiving chemotherapy for B-cell lymphoma, I spoke with her son about what is going on, the ultrasound reviewed by radiology and myself shows concerning signs for cholecystitis.  That being the case she will be given broad-spectrum antibiotics, cultures will be obtained.  She will need a consult for general surgery and likely admission.  Cholangitis also on the list however the common bile duct appears normal.  Additional bolus of lactated Ringer's will be given, she remains hemodynamically stable.  Screening Covid test will be sent.  General surgery team will evaluate the patient, they requested medicine admission due to her multiple complex medical issues.  Have also consulted gastroenterology they will round on the patient tomorrow, based on the story laboratory studies they do not feel there is emergent need for their involvement right now.  The patient will be admitted to the hospitalist.  For the remainder this patient's care please see inpatient team notes.  I will intervene as needed while the patient remains in the emergency department.  CRITICAL  CARE Performed by: Breck Coons   Total critical care time: 45 minutes  Critical care time was exclusive of separately billable procedures and treating other patients.  Critical care was necessary to treat or prevent imminent or life-threatening deterioration.  Critical care was time spent personally by  me on the following activities: development of treatment plan with patient and/or surrogate as well as nursing, discussions with consultants, evaluation of patient's response to treatment, examination of patient, obtaining history from patient or surrogate, ordering and performing treatments and interventions, ordering and review of laboratory studies, ordering and review of radiographic studies, pulse oximetry and re-evaluation of patient's condition.   Final Clinical Impression(s) / ED Diagnoses Final diagnoses:  RUQ pain  Cholecystitis  Sepsis, due to unspecified organism, unspecified whether acute organ dysfunction present ([place])  Lactic acidosis    Rx / DC Orders ED Discharge Orders    None       Breck Coons, MD 08/16/20 1826    Breck Coons, MD 08/16/20 [phone]

## 2020-08-16 NOTE — ED Triage Notes (Signed)
Pt presents with c/o abdominal pain for one month. Pt also reports she went to her MD and had lab work done and they sent her here because they said she was dehydrated.

## 2020-08-16 NOTE — Progress Notes (Signed)
Please call once Respiratory panel (Covid) results are available.  Receiving nurse is aware of patient pending transfer.  CN has spoken with attending RN in ED and made her aware

## 2020-08-17 ENCOUNTER — Inpatient Hospital Stay (HOSPITAL_COMMUNITY): Payer: PPO

## 2020-08-17 HISTORY — PX: IR PERC CHOLECYSTOSTOMY: IMG2326

## 2020-08-17 LAB — CBC
HCT: 32.3 % — ABNORMAL LOW (ref 36.0–46.0)
Hemoglobin: 10.2 g/dL — ABNORMAL LOW (ref 12.0–15.0)
MCH: 32.1 pg (ref 26.0–34.0)
MCHC: 31.6 g/dL (ref 30.0–36.0)
MCV: 101.6 fL — ABNORMAL HIGH (ref 80.0–100.0)
Platelets: 121 10*3/uL — ABNORMAL LOW (ref 150–400)
RBC: 3.18 MIL/uL — ABNORMAL LOW (ref 3.87–5.11)
RDW: 14.6 % (ref 11.5–15.5)
WBC: 14.3 10*3/uL — ABNORMAL HIGH (ref 4.0–10.5)
nRBC: 0 % (ref 0.0–0.2)

## 2020-08-17 LAB — COMPREHENSIVE METABOLIC PANEL
ALT: 10 U/L (ref 0–44)
AST: 12 U/L — ABNORMAL LOW (ref 15–41)
Albumin: 2.3 g/dL — ABNORMAL LOW (ref 3.5–5.0)
Alkaline Phosphatase: 60 U/L (ref 38–126)
Anion gap: 7 (ref 5–15)
BUN: 10 mg/dL (ref 8–23)
CO2: 23 mmol/L (ref 22–32)
Calcium: 8.2 mg/dL — ABNORMAL LOW (ref 8.9–10.3)
Chloride: 107 mmol/L (ref 98–111)
Creatinine, Ser: 0.43 mg/dL — ABNORMAL LOW (ref 0.44–1.00)
GFR calc Af Amer: >60 mL/min (ref >60)
GFR calc non Af Amer: >60 mL/min (ref >60)
Glucose, Bld: 85 mg/dL (ref 70–99)
Potassium: 3.1 mmol/L — ABNORMAL LOW (ref 3.5–5.1)
Sodium: 137 mmol/L (ref 135–145)
Total Bilirubin: 1 mg/dL (ref 0.3–1.2)
Total Protein: 4.7 g/dL — ABNORMAL LOW (ref 6.5–8.1)

## 2020-08-17 LAB — URINALYSIS, ROUTINE W REFLEX MICROSCOPIC
Glucose, UA: NEGATIVE mg/dL
Hgb urine dipstick: NEGATIVE
Ketones, ur: NEGATIVE mg/dL
Nitrite: NEGATIVE
Protein, ur: 30 mg/dL — AB
Specific Gravity, Urine: 1.026 (ref 1.005–1.030)
pH: 6 (ref 5.0–8.0)

## 2020-08-17 LAB — PROTIME-INR
INR: 1.3 — ABNORMAL HIGH (ref 0.8–1.2)
Prothrombin Time: 15.6 seconds — ABNORMAL HIGH (ref 11.4–15.2)

## 2020-08-17 LAB — LACTIC ACID, PLASMA: Lactic Acid, Venous: 1 mmol/L (ref 0.5–1.9)

## 2020-08-17 LAB — PROCALCITONIN: Procalcitonin: 5.95 ng/mL

## 2020-08-17 LAB — CORTISOL-AM, BLOOD: Cortisol - AM: 14.2 ug/dL (ref 6.7–22.6)

## 2020-08-17 MED ORDER — LIDOCAINE HCL 1 % IJ SOLN
INTRAMUSCULAR | Status: AC
  Filled 2020-08-17: qty 20

## 2020-08-17 MED ORDER — MIDAZOLAM HCL 2 MG/2ML IJ SOLN
INTRAMUSCULAR | Status: AC
  Filled 2020-08-17: qty 4

## 2020-08-17 MED ORDER — ENOXAPARIN SODIUM 80 MG/0.8ML ~~LOC~~ SOLN
80.0000 mg | Freq: Two times a day (BID) | SUBCUTANEOUS | Status: DC
  Administered 2020-08-17 – 2020-08-23 (×12): 80 mg via SUBCUTANEOUS
  Filled 2020-08-17 (×12): qty 0.8

## 2020-08-17 MED ORDER — POTASSIUM CHLORIDE 2 MEQ/ML IV SOLN
INTRAVENOUS | Status: AC
  Filled 2020-08-17 (×4): qty 1000

## 2020-08-17 MED ORDER — HEPARIN (PORCINE) 25000 UT/250ML-% IV SOLN: 1150.0000 [IU]/h | INTRAVENOUS | Status: DC

## 2020-08-17 MED ORDER — MORPHINE SULFATE (PF) 2 MG/ML IV SOLN
2.0000 mg | INTRAVENOUS | Status: AC | PRN
  Administered 2020-08-17 – 2020-08-18 (×3): 2 mg via INTRAVENOUS
  Filled 2020-08-17 (×3): qty 1

## 2020-08-17 MED ORDER — FENTANYL CITRATE (PF) 100 MCG/2ML IJ SOLN
INTRAMUSCULAR | Status: AC
  Filled 2020-08-17: qty 2

## 2020-08-17 MED ORDER — SODIUM CHLORIDE 0.9 % IV BOLUS
250.0000 mL | Freq: Once | INTRAVENOUS | Status: AC
  Administered 2020-08-17: 250 mL via INTRAVENOUS

## 2020-08-17 MED ORDER — TECHNETIUM TC 99M MEBROFENIN IV KIT
5.4500 | PACK | Freq: Once | INTRAVENOUS | Status: AC | PRN
  Administered 2020-08-17: 5.45 via INTRAVENOUS

## 2020-08-17 MED ORDER — MORPHINE SULFATE (PF) 4 MG/ML IV SOLN
3.0000 mg | Freq: Once | INTRAVENOUS | Status: AC
  Administered 2020-08-17: 3 mg via INTRAVENOUS
  Filled 2020-08-17: qty 1

## 2020-08-17 MED ORDER — MIDAZOLAM HCL 2 MG/2ML IJ SOLN
INTRAMUSCULAR | Status: AC | PRN
  Administered 2020-08-17: 0.5 mg via INTRAVENOUS

## 2020-08-17 MED ORDER — POTASSIUM CHLORIDE 10 MEQ/100ML IV SOLN
10.0000 meq | INTRAVENOUS | Status: AC
  Administered 2020-08-17 (×4): 10 meq via INTRAVENOUS
  Filled 2020-08-17 (×4): qty 100

## 2020-08-17 MED ORDER — CHLORHEXIDINE GLUCONATE CLOTH 2 % EX PADS
6.0000 | MEDICATED_PAD | Freq: Every day | CUTANEOUS | Status: DC
  Administered 2020-08-17 – 2020-08-25 (×9): 6 via TOPICAL

## 2020-08-17 MED ORDER — FENTANYL CITRATE (PF) 100 MCG/2ML IJ SOLN
INTRAMUSCULAR | Status: AC | PRN
  Administered 2020-08-17: 25 ug via INTRAVENOUS

## 2020-08-17 NOTE — Progress Notes (Signed)
Will not initiate Heparin drip. Patient is scheduled for procedure in Interventional Radiology.

## 2020-08-17 NOTE — Progress Notes (Signed)
   08/17/20 1713  Assess: MEWS Score  BP (!) 135/58  Pulse Rate 100  Resp (!) 34  SpO2 97 %  O2 Device Nasal Cannula  O2 Flow Rate (L/min) 2 L/min  Assess: MEWS Score  MEWS Temp 0  MEWS Systolic 0  MEWS Pulse 0  MEWS RR 2  MEWS LOC 0  MEWS Score 2  MEWS Score Color Yellow  Treat  Pain Scale 0-10  Pain Score 0  Patient is not on unit. Patient in interventional radiology for procedure.

## 2020-08-17 NOTE — Progress Notes (Signed)
PROGRESS NOTE    Dona Walby [facility]  ZHY:[id number] DOB: [birth year] DOA: 08/16/2020 PCP: Lucianne Lei, MD  Brief Narrative:   69 year old black female [place] (follows with Dr. Jolayne Haines) pulmonary embolism 2017 Rx 6 months Xarelto For history of endometriosis not amenable to surgical resection DM, HTN, HLD Hospitalized 8/26 through 9/1 large pulmonary embolism-started back on anticoagulation-developed A. fib RVR but was ultimately sent to skilled Returns to hospital 9/23 1 month nausea vomiting-in ED low-grade temp tachycardia tachyarrhythmia Lactic acid 3.7 gap 14 bilirubin 1.4 WBC 19 Abdominal ultrasound = acute calculus cholecystitis Rx IV cefepime Flagyl GI and general surgery consult by emergency room  Ultimately it was decided that patient was a very poor surgical candidate and that patient would need PERC drain placement   Assessment & Plan:   Principal Problem:   Cholecystitis, acute Active Problems:   Essential hypertension, benign   Pulmonary emboli (HCC)   Atrial fibrillation with RVR (HCC)   Anemia of chronic disease   Chronic pain   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HCC)   Abdominal pain   Severe sepsis ([place])   Pressure injury of skin   1. Sepsis secondary to possible cholecystitis a. Received goal-directed fluid therapy b. General surgery has seen patient is a very poor surgical candidate given comorbidities c. HIDA scan personally over read by Dr. Chalmers Guest discussed with me is positive-PERC drain being planned by general surgery d. Follow LFTs a.m. white count and continue cefepime and Flagyl at this time e. Appreciate general surgery input 2. Recent large saddle pulmonary embolism on heparin currently a. Hospitalized recently for embolism b. Resume heparin until procedure planned and can be turned off 1 to 2 hours prior 3. Hypokalemia a. Changed fluids to contain potassium 40 mEq at 75 cc/h b. Repeat labs including magnesium in  the morning for sepsis on admission 4. DLBLC followed at [facility] a. Outpatient follow-up with [facility] 5. DM TY 2 A1c 4.6 this admission a. Blood sugars ranging 85-93 she is n.p.o. b. No coverage at this time 6. HTN a. History of no meds at present-monitor 7. HLD 8. Prior endometriosis a. Not amenable to surgery-Provera can worsen risk for VTE-defer this decision making to her OB/GYN b. Hold for now c. Outpatient follow-up  DVT prophylaxis: Heparin to be resumed Code Status: Full Family Communication: called son [name] [phone] Disposition:   Status is: Inpatient  Remains inpatient appropriate because:Hemodynamically unstable, Persistent severe electrolyte disturbances, Altered mental status and Ongoing diagnostic testing needed not appropriate for outpatient work up   Dispo: The patient is from: Home              Anticipated d/c is to: SNF              Anticipated d/c date is: 2 days              Patient currently is not medically stable to d/c.   Consultants:   gen surgery  Procedures: Hida  Antimicrobials: flagyl/cefepime   Subjective: Awake in some pain no fever No chills is npo  Objective: Vitals:   08/17/20 0002 08/17/20 0203 08/17/20 0355 08/17/20 0557  BP: 122/65 118/62 (!) 110/50 (!) 101/45  Pulse: 94 87 87 89  Resp: 17 16 17 20   Temp: 98.6 F (37 C) 98.2 F (36.8 C) 98.3 F (36.8 C) 98.5 F (36.9 C)  TempSrc:  Oral Oral Oral  SpO2: 100% 99% 100% 99%  Weight:  Height:        Intake/Output Summary (Last 24 hours) at 08/17/2020 0754 Last data filed at 08/17/2020 4920 Gross per 24 hour  Intake 1460.37 ml  Output 350 ml  Net 1110.37 ml   Filed Weights   08/16/20 2138  Weight: 78.3 kg    Examination:  General exam: Slight sleepy arousable however no distress in some discomfort Respiratory system: Clear no added sound Cardiovascular system: S1-S2 no murmur rub or gallop Gastrointestinal system: Murphy's test positive at the bedside  slight distention no hepatosplenomegaly intact m Central nervous system: Moving all 4 limbs equally Extremities: No lower extremity edema Skin: No lower extremity edema Psychiatry: euthymic but flat  Data Reviewed: I have personally reviewed following labs and imaging studies Potassium 3.1 BUN/creatinine 10/0.4 Procalcitonin 5 WBC 14 Hemoglobin 10 Platelet 121 INR 1.3  Radiology Studies: DG Chest Port 1 View  Result Date: 08/16/2020 CLINICAL DATA:  Questionable sepsis - evaluate for abnormality Abdominal pain. EXAM: PORTABLE CHEST 1 VIEW COMPARISON:  Chest radiograph 07/21/2020.  CT 07/19/2020 FINDINGS: Lung volumes are low. Accessed right chest port with tip in the right atrium, unchanged. Similar cardiomegaly. Unchanged mediastinal contours allowing for rotation. Minor subsegmental atelectasis at the lung bases. No confluent airspace disease. No pneumothorax or large pleural effusion. Degenerative change of both shoulders. IMPRESSION: 1. Low lung volumes with mild bibasilar atelectasis. 2. Unchanged cardiomegaly. Electronically Signed   By: Keith Rake M.D.   On: 08/16/2020 18:03   US Abdomen Limited RUQ  Result Date: 08/16/2020 CLINICAL DATA:  Right upper quadrant abdominal pain EXAM: ULTRASOUND ABDOMEN LIMITED RIGHT UPPER QUADRANT COMPARISON:  CT dated May 22, 2020 FINDINGS: Gallbladder: There is cholelithiasis. The gallbladder wall is thickened measuring approximately 5 mm. There is pericholecystic free fluid. The sonographic Percell Miller sign is reported as negative. Common bile duct: Diameter: 5 mm Liver: No focal lesion identified. Within normal limits in parenchymal echogenicity. Portal vein is patent on color Doppler imaging with normal direction of blood flow towards the liver. Other: None. IMPRESSION: Findings are equivocal for acute calculus cholecystitis. While there is gallbladder wall thickening in the presence of gallstones and pericholecystic free fluid, the sonographic Percell Miller  sign is reported as negative. Surgical consultation is recommended. Consider HIDA scan for further evaluation. Electronically Signed   By: Constance Holster M.D.   On: 08/16/2020 17:12     Scheduled Meds: . Chlorhexidine Gluconate Cloth  6 each Topical Daily  . sodium chloride flush  10-40 mL Intracatheter Q12H   Continuous Infusions: . ceFEPime (MAXIPIME) IV 2 g (08/17/20 0606)  . lactated ringers Stopped (08/16/20 2211)  . lactated ringers 150 mL/hr at 08/17/20 0005  . metronidazole 500 mg (08/17/20 0651)     LOS: 1 day    Time spent: [place], MD [facility] To contact the attending provider between 7A-7P or the covering provider during after hours 7P-7A, please log into the web site [email or web address] and access using universal [place] password for that web site. If you do not have the password, please call the hospital operator.  08/17/2020, 7:54 AM

## 2020-08-17 NOTE — TOC Initial Note (Signed)
Transition of Care [facility]) - Initial/Assessment Note    Patient Details  Name: Lauren Santana MRN: [id number] Date of Birth: 11-Feb-1951  Transition of [facility]) CM/SW Contact:    Dessa Phi, RN Phone Number: 08/17/2020, 3:37 PM  Clinical Narrative: Spoke to patient about d/c plans-recent SNF stay @ [facility]-she prefers to d/c home @ d/c.                  Expected Discharge Plan: Home/Self Care Barriers to Discharge: Continued Medical Work up   Patient Goals and CMS Choice Patient states their goals for this hospitalization and ongoing recovery are:: go home CMS Medicare.gov Compare Post Acute Care list provided to:: Patient Choice offered to / list presented to : Patient  Expected Discharge Plan and Services Expected Discharge Plan: Home/Self Care   Discharge Planning Services: CM Consult   Living arrangements for the past 2 months: Single Family Home                                      Prior Living Arrangements/Services Living arrangements for the past 2 months: Single Family Home Lives with:: Self Patient language and need for interpreter reviewed:: Yes Do you feel safe going back to the place where you live?: Yes      Need for Family Participation in Patient Care: No (Comment) Care giver support system in place?: Yes (comment) Current home services: DME (rw) Criminal Activity/Legal Involvement Pertinent to Current Situation/Hospitalization: No - Comment as needed  Activities of Daily Living Home Assistive Devices/Equipment: Walker (specify type) ADL Screening (condition at time of admission) Patient's cognitive ability adequate to safely complete daily activities?: No Is the patient deaf or have difficulty hearing?: No Does the patient have difficulty seeing, even when wearing glasses/contacts?: No Does the patient have difficulty concentrating, remembering, or making decisions?: No Patient able to express need for assistance with ADLs?:  Yes Does the patient have difficulty dressing or bathing?: Yes Independently performs ADLs?: No Communication: Independent Dressing (OT): Needs assistance Grooming: Appropriate for developmental age Feeding: Independent Bathing: Needs assistance Toileting: Needs assistance In/Out Bed: Needs assistance Walks in Home: Independent Does the patient have difficulty walking or climbing stairs?: Yes Weakness of Legs: Both Weakness of Arms/Hands: None  Permission Sought/Granted Permission sought to share information with : Case Manager Permission granted to share information with : Yes, Verbal Permission Granted  Share Information with NAME: Case Manager           Emotional Assessment Appearance:: Appears stated age Attitude/Demeanor/Rapport: Gracious Affect (typically observed): Accepting Orientation: : Oriented to  Time, Oriented to Place, Oriented to Self Alcohol / Substance Use: Not Applicable Psych Involvement: No (comment)  Admission diagnosis:  Lactic acidosis [E87.2] Cholecystitis [K81.9] Cholecystitis, acute [K81.0] RUQ pain [R10.11] Sepsis, due to unspecified organism, unspecified whether acute organ dysfunction present [facility]) [A41.9] Patient Active Problem List   Diagnosis Date Noted  . Cholecystitis, acute 08/16/2020  . Abdominal pain 08/16/2020  . Severe sepsis ([place]) 08/16/2020  . Pressure injury of skin 08/16/2020  . Atrial fibrillation with RVR ([place]) 07/24/2020  . Thrombocytopenia ([place]) 07/24/2020  . Anemia of chronic disease 07/24/2020  . Chronic pain 07/24/2020  . 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[place]) 07/24/2020  . Pulmonary embolism ([place]) 07/19/2020  . 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 S/P BSO (bilateral salpingo-oophorectomy) 01/08/2017  . S/P hysterectomy 01/08/2017  . Tachycardia   .  Acute deep vein thrombosis (DVT) of right lower extremity ([place]) 02/14/2016  . Pulmonary emboli ([place]) 02/13/2016  . PE (pulmonary embolism)  02/13/2016  . AKI (acute kidney injury) ([place]) 02/13/2016  . Pulmonary embolism with acute cor pulmonale ([place])   . Pelvic mass in female   . Obesity (BMI 30-39.9) 08/20/2015  . 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 Essential hypertension, benign 07/23/2014  . Edema 07/23/2014  . Dyslipidemia 07/23/2014  . Lower extremity numbness 07/23/2014  . 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 Postmenopausal bleeding 03/10/2012  . Endometriosis of pelvis 01/22/2007   PCP:  Lucianne Lei, MD Pharmacy:   [id number] - [place], [place] [address] [place] [address] Phone: [phone] Fax: [phone]     Social Determinants of Health (SDOH) Interventions    Readmission Risk Interventions Readmission Risk Prevention Plan 07/23/2020  Transportation Screening Complete  Medication Review (RN CM) Complete  Some recent data might be hidden

## 2020-08-17 NOTE — Progress Notes (Signed)
[place] [facility] Office:  [phone] General Surgery Progress Note   LOS: 1 day  POD -     Assessment and Plan: 1.  Possible cholecystitis  Plan to get HIDA scan - if positive would consider perc drain of gall bladder  WBC - 14,300 - 08/17/2020  Flagyl/cefepime   She has no abdominal symptoms at this time.  2.  History of PE at end of August 2021 3.  A fib 4.  Diffuse large B cell lymphoma  Being treated at [facility] - last chemotherapy treatment about 6 weeks ago  Dr. Jolayne Haines is her treating oncologist at [facility] 5. DVT prophylaxis - on hold 6.  Hypokalemia  K+ - 3.1 - 08/17/2020   Principal Problem:   Cholecystitis, acute Active Problems:   Essential hypertension, benign   Pulmonary emboli (HCC)   Atrial fibrillation with RVR (HCC)   Anemia of chronic disease   Chronic pain   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HCC)   Abdominal pain   Severe sepsis (HCC)   Pressure injury of skin  Subjective:  She is not a good historian and often does not complete sentences.  But she has no abdominal pain at this time.  She lives by herself.   Her son [name] is her main contact.  Objective:   Vitals:   08/17/20 0811 08/17/20 0933  BP: (!) 112/50 (!) 106/51  Pulse: 95 94  Resp: (!) 24 (!) 21  Temp: 98.6 F (37 C) 98.5 F (36.9 C)  SpO2: 99% 100%     Intake/Output from previous day:  09/23 0701 - 09/24 0700 In: 1460.4 [I.V.:973.4; IV Piggyback:487] Out: 350 [Urine:350]  Intake/Output this shift:  No intake/output data recorded.   Physical Exam:   General: AA F who is alert and oriented.    HEENT: Normal. Pupils equal. .   Lungs: Clear   Abdomen: Soft, no localized tenderness   Lab Results:    Recent Labs    08/16/20 1441 08/17/20 0051  WBC 19.7* 14.3*  HGB 12.2 10.2*  HCT 40.0 32.3*  PLT 160 121*    BMET   Recent Labs    08/16/20 1441 08/17/20 0051  NA 140 137  K 3.7 3.1*  CL 103 107  CO2 23 23  GLUCOSE 93 85  BUN 11 10   CREATININE 0.68 0.43*  CALCIUM 9.2 8.2*    PT/INR   Recent Labs    08/16/20 2207 08/17/20 0051  LABPROT 15.7* 15.6*  INR 1.3* 1.3*    ABG  No results for input(s): PHART, HCO3 in the last 72 hours.  Invalid input(s): PCO2, PO2   Studies/Results:  DG Chest Port 1 View  Result Date: 08/16/2020 CLINICAL DATA:  Questionable sepsis - evaluate for abnormality Abdominal pain. EXAM: PORTABLE CHEST 1 VIEW COMPARISON:  Chest radiograph 07/21/2020.  CT 07/19/2020 FINDINGS: Lung volumes are low. Accessed right chest port with tip in the right atrium, unchanged. Similar cardiomegaly. Unchanged mediastinal contours allowing for rotation. Minor subsegmental atelectasis at the lung bases. No confluent airspace disease. No pneumothorax or large pleural effusion. Degenerative change of both shoulders. IMPRESSION: 1. Low lung volumes with mild bibasilar atelectasis. 2. Unchanged cardiomegaly. Electronically Signed   By: Keith Rake M.D.   On: 08/16/2020 18:03   US Abdomen Limited RUQ  Result Date: 08/16/2020 CLINICAL DATA:  Right upper quadrant abdominal pain EXAM: ULTRASOUND ABDOMEN LIMITED RIGHT UPPER QUADRANT COMPARISON:  CT dated May 22, 2020 FINDINGS: Gallbladder: There is cholelithiasis. The  gallbladder wall is thickened measuring approximately 5 mm. There is pericholecystic free fluid. The sonographic Percell Miller sign is reported as negative. Common bile duct: Diameter: 5 mm Liver: No focal lesion identified. Within normal limits in parenchymal echogenicity. Portal vein is patent on color Doppler imaging with normal direction of blood flow towards the liver. Other: None. IMPRESSION: Findings are equivocal for acute calculus cholecystitis. While there is gallbladder wall thickening in the presence of gallstones and pericholecystic free fluid, the sonographic Percell Miller sign is reported as negative. Surgical consultation is recommended. Consider HIDA scan for further evaluation. Electronically Signed   By:  Constance Holster M.D.   On: 08/16/2020 17:12     Anti-infectives:   Anti-infectives (From admission, onward)   Start     Dose/Rate Route Frequency Ordered Stop   08/17/20 0600  ceFEPIme (MAXIPIME) 2 g in sodium chloride 0.9 % 100 mL IVPB  Status:  Discontinued        2 g 200 mL/hr over 30 Minutes Intravenous Every 8 hours 08/16/20 1840 08/16/20 2002   08/16/20 2200  ceFEPIme (MAXIPIME) 2 g in sodium chloride 0.9 % 100 mL IVPB  Status:  Discontinued        2 g 200 mL/hr over 30 Minutes Intravenous Every 8 hours 08/16/20 2002 08/16/20 2005   08/16/20 2030  metroNIDAZOLE (FLAGYL) IVPB 500 mg        500 mg 100 mL/hr over 60 Minutes Intravenous Every 8 hours 08/16/20 2001     08/16/20 2015  ceFEPIme (MAXIPIME) 2 g in sodium chloride 0.9 % 100 mL IVPB        2 g 200 mL/hr over 30 Minutes Intravenous Every 8 hours 08/16/20 2002     08/16/20 1815  ceFEPIme (MAXIPIME) 2 g in sodium chloride 0.9 % 100 mL IVPB  Status:  Discontinued        2 g 200 mL/hr over 30 Minutes Intravenous  Once 08/16/20 1811 08/16/20 2001   08/16/20 1815  metroNIDAZOLE (FLAGYL) IVPB 500 mg  Status:  Discontinued        500 mg 100 mL/hr over 60 Minutes Intravenous  Once 08/16/20 1811 08/16/20 2001      Alphonsa Overall, MD, [facility] Surgery Office: [phone] 08/17/2020

## 2020-08-17 NOTE — Progress Notes (Signed)
Patient returned from study, see flow sheet VS. Patient remains NPO, mouth wash and rinse provided. Will continue to monitor.

## 2020-08-17 NOTE — Progress Notes (Addendum)
Patient returned from IR. Drain dressing to right abdomen is patent clean dry intact.Drainage brown purulent. Pt complains of pain 9/10 and nausea.

## 2020-08-17 NOTE — Procedures (Signed)
Interventional Radiology Procedure Note  Procedure: perc cholecystostomy    Complications: None  Estimated Blood Loss:  min  Findings: Exudative bile aspirated cx sent    Tamera Punt, MD

## 2020-08-17 NOTE — Progress Notes (Signed)
HIDA positive, will order perc chole drain given patient is a poor surgical candidate at this time.  Henreitta Cea 2:31 PM 08/17/2020

## 2020-08-17 NOTE — Progress Notes (Addendum)
2130-Admitted from ED, Aox4 not in distress, due meds and IVF bolus to start, pt has accessed mediport but currently with cathflo dwelling. Pt is a hard stick, some blood tests are pending for collection. 0000- IVF and ATB started (see MAR), pt has a due Heparin drip to start at 2330 last night but was unable to start on time due to lack of another IV access, LR bolus and IV atb ongoing. Pharmacy informed and heparin is not compatible with LR. IV team consulted for IV access placement. 0040-Heparin drip started as ordered.  6922- Lactic acid is now 1.0 from 3.7, K is 3.1, hospitalist on call informed by page.

## 2020-08-17 NOTE — Progress Notes (Signed)
V.O received from Dr. Henderson Baltimore to administer Morphine 3 mg IV.

## 2020-08-17 NOTE — Progress Notes (Addendum)
Referring Physician(s): Newman,D  Supervising Physician: Daryll Brod  Patient Status:  [facility] - In-pt  Chief Complaint:  Abdominal pain, nausea, cholecystitis  Subjective: Patient familiar to IR service from chest tube placement in 2001, and left pelvic mass biopsy in 2016.  She is a 69 year old female with history of endometriosis, hypercholesterolemia, hypertension, PE as well as chronic right popliteal DVT and acute left femoral/popliteal vein DVT in August of this year- on outpatient Lovenox, diffuse large B-cell lymphoma and paroxysmal atrial fibrillation.  She was recently admitted to [facility] with right-sided abdominal pain of several days duration along with nausea/intermittent vomiting.  Ultrasound of abdomen revealed findings equivocal for acute calculus cholecystitis with gallbladder wall thickening, gallstones and pericholecystic free fluid.  HIDA scan performed today revealed no gallbladder filling before or after IV morphine likely reflecting cystic duct obstruction.  Patient was evaluated by surgery and deemed a poor surgical candidate.  Request now received for percutaneous cholecystostomy.  Patient currently afebrile, WBC 14.3, hemoglobin 10.2, platelets 121k, potassium 3.1, creatinine 0.43, total bilirubin 1, lactic acid normal, COVID-19 negative, blood/urine cultures pending.  Additional history as below.  Past Medical History:  Diagnosis Date  . Arthritis   . Edema    B/LLE  . Endometriosis   . High cholesterol   . Hypertension   . Numbness    feet  . Pre-diabetes   . Pulmonary embolism (HCC)    recurrent, second episode in 06/2020  . Walker as ambulation aid   . Wears dentures   . Wears glasses    Past Surgical History:  Procedure Laterality Date  . ABDOMINAL HYSTERECTOMY     and BSO  . CATARACT EXTRACTION Left 10/2018  . [place]  . COLONOSCOPY    . EYE SURGERY Bilateral    CATARACT SX  . HERNIA REPAIR  2001    incisional  . JOINT REPLACEMENT Left    Knee  . MAXILLARY ANTROSTOMY Right 01/20/2020   Procedure: MAXILLARY ANTROSTOMY  WITH BIOPSY CALDWELL APPROACH;  Surgeon: Jerrell Belfast, MD;  Location: [place];  Service: ENT;  Laterality: Right;  . MULTIPLE TOOTH EXTRACTIONS  2011  . NASAL ENDOSCOPY Right 02/17/2020   Procedure: NASAL ENDOSCOPY WITH RIGHT INFERIOR TURNBINATE REDUCTION;  Surgeon: Jerrell Belfast, MD;  Location: [place];  Service: ENT;  Laterality: Right;  . neck mass removal     "fatty tissue, not cancerous" per pt's son  . SINUS ENDO WITH FUSION Right 01/20/2020   Procedure: SINUS ENDO WITH FUSION WITH BIOSPY;  Surgeon: Jerrell Belfast, MD;  Location: [place];  Service: ENT;  Laterality: Right;  . TOTAL KNEE ARTHROPLASTY Left 07/17/2014   Procedure: TOTAL KNEE ARTHROPLASTY;  Surgeon: Kerin Salen, MD;  Location: [place];  Service: Orthopedics;  Laterality: Left;      Allergies: Tylenol [acetaminophen] and Sulfa antibiotics  Medications: Prior to Admission medications   Medication Sig Start Date End Date Taking? Authorizing Provider  atorvastatin (LIPITOR) 10 MG tablet TAKE ONE TABLET BY MOUTH  ONCE DAILY IN THE EVENING Patient taking differently: Take 10 mg by mouth every evening.  03/18/16  Yes Gildardo Cranker, DO  diclofenac Sodium (VOLTAREN) 1 % GEL Apply 2 g topically daily as needed (pain).  08/11/20  Yes [provider]  enoxaparin (LOVENOX) 80 MG/0.8ML injection Inject 0.8 mLs (80 mg total) into the skin every 12 (twelve) hours. 07/25/20  Yes Dwyane Dee, MD  gabapentin (NEURONTIN) 300 MG capsule Take 300 mg by mouth  3 (three) times daily.   Yes [provider]  medroxyPROGESTERone (PROVERA) 10 MG tablet Take 10 mg by mouth daily.  01/06/20  Yes [provider]  ondansetron (ZOFRAN) 4 MG tablet Take 1 tablet (4 mg total) by mouth 2 (two) times daily as needed for nausea. 07/25/20  Yes Dwyane Dee, MD  oxyCODONE-acetaminophen (PERCOCET) 7.5-325 MG tablet  Take 1 tablet by mouth every 6 (six) hours as needed for severe pain. 07/25/20  Yes Dwyane Dee, MD  potassium chloride (KLOR-CON) 10 MEQ tablet Take 10 mEq by mouth 2 (two) times daily. 06/20/20  Yes [provider]  sucralfate (CARAFATE) 1 GM/10ML suspension Take 1 g by mouth 3 (three) times daily before meals. 01/06/20  Yes [provider]     Vital Signs: BP (!) 116/55 (BP Location: Left Arm)   Pulse 95   Temp 98.7 F (37.1 C) (Oral)   Resp (!) 23   Ht 5' 0.98" (1.549 m)   Wt 172 lb 9.9 oz (78.3 kg)   LMP  (LMP Unknown)   SpO2 100%   BMI 32.63 kg/m   Physical Exam patient awake, answers simple questions okay, oriented to name and birthdate, year and location; chest clear to auscultation bilaterally.  Heart with regular rate and rhythm.  Abdomen soft, positive bowel sounds, mild to moderately tender  right upper quadrant to palpation.  No lower extremity edema. Imaging: NM Hepatobiliary Liver Func  Result Date: 08/17/2020 CLINICAL DATA:  Abdominal pain. Ultrasound imaging suggesting cholecystitis. EXAM: NUCLEAR MEDICINE HEPATOBILIARY IMAGING TECHNIQUE: Sequential images of the abdomen were obtained out to 60 minutes following intravenous administration of radiopharmaceutical. RADIOPHARMACEUTICALS:  5.5 mCi Tc-57m  Choletec IV COMPARISON:  Ultrasound exam 08/16/2020 FINDINGS: Prompt uptake of radiotracer by the liver parenchyma is evident. Biliary activity is visible by 20 minutes with gut activity noted at 25-30 minutes. After 2 hours of observation, no gallbladder filling is observed. Patient was given 3 mg intravenous morphine and imaged for an additional 30 minutes. A booster dose could not be administered for further imaging due to national radiopharmaceutical shortage. After 30 minutes of observation post morphine, no gallbladder filling is observed. IMPRESSION: 1. Normal hepatobiliary patency without common bile duct obstruction. 2. No gallbladder filling before or  after intravenous morphine administration. Imaging after morphine limited by inability to administer a booster dose of radiopharmaceutical, but imaging features likely reflect cystic duct obstruction. Electronically Signed   By: Misty Stanley M.D.   On: 08/17/2020 14:01   DG Chest Port 1 View  Result Date: 08/16/2020 CLINICAL DATA:  Questionable sepsis - evaluate for abnormality Abdominal pain. EXAM: PORTABLE CHEST 1 VIEW COMPARISON:  Chest radiograph 07/21/2020.  CT 07/19/2020 FINDINGS: Lung volumes are low. Accessed right chest port with tip in the right atrium, unchanged. Similar cardiomegaly. Unchanged mediastinal contours allowing for rotation. Minor subsegmental atelectasis at the lung bases. No confluent airspace disease. No pneumothorax or large pleural effusion. Degenerative change of both shoulders. IMPRESSION: 1. Low lung volumes with mild bibasilar atelectasis. 2. Unchanged cardiomegaly. Electronically Signed   By: Keith Rake M.D.   On: 08/16/2020 18:03   US Abdomen Limited RUQ  Result Date: 08/16/2020 CLINICAL DATA:  Right upper quadrant abdominal pain EXAM: ULTRASOUND ABDOMEN LIMITED RIGHT UPPER QUADRANT COMPARISON:  CT dated May 22, 2020 FINDINGS: Gallbladder: There is cholelithiasis. The gallbladder wall is thickened measuring approximately 5 mm. There is pericholecystic free fluid. The sonographic Percell Miller sign is reported as negative. Common bile duct: Diameter: 5 mm Liver: No  focal lesion identified. Within normal limits in parenchymal echogenicity. Portal vein is patent on color Doppler imaging with normal direction of blood flow towards the liver. Other: None. IMPRESSION: Findings are equivocal for acute calculus cholecystitis. While there is gallbladder wall thickening in the presence of gallstones and pericholecystic free fluid, the sonographic Percell Miller sign is reported as negative. Surgical consultation is recommended. Consider HIDA scan for further evaluation. Electronically  Signed   By: Constance Holster M.D.   On: 08/16/2020 17:12    Labs:  CBC: Recent Labs    07/24/20 0405 07/25/20 0329 08/16/20 1441 08/17/20 0051  WBC 7.1 6.7 19.7* 14.3*  HGB 9.6* 9.4* 12.2 10.2*  HCT 32.1* 30.2* 40.0 32.3*  PLT 126* 119* 160 121*    COAGS: Recent Labs    08/16/20 2207 08/17/20 0051  INR 1.3* 1.3*  APTT 29  --     BMP: Recent Labs    07/24/20 0405 07/25/20 0329 08/16/20 1441 08/17/20 0051  NA 135 137 140 137  K 4.6 4.5 3.7 3.1*  CL 105 108 103 107  CO2 22 22 23 23   GLUCOSE 105* 94 93 85  BUN 10 8 11 10   CALCIUM 8.7* 8.6* 9.2 8.2*  CREATININE 0.71 0.66 0.68 0.43*  GFRNONAA >60 >60 >60 >60  GFRAA >60 >60 >60 >60    LIVER FUNCTION TESTS: Recent Labs    05/28/20 1756 07/19/20 1202 08/16/20 1441 08/17/20 0051  BILITOT 0.6 1.1 1.4* 1.0  AST 19 24 15  12*  ALT 17 15 13 10   ALKPHOS 113 86 77 60  PROT 6.0* 5.7* 6.5 4.7*  ALBUMIN 3.2* 3.0* 3.1* 2.3*    Assessment and Plan: Patient familiar to IR service from chest tube placement in 2001, and left pelvic mass biopsy in 2016.  She is a 69 year old female with history of endometriosis, hypercholesterolemia, hypertension, PE as well as chronic right popliteal DVT (2017) and acute left femoral/popliteal vein DVT  in August of this year- on outpatient Lovenox, diffuse large B-cell lymphoma and paroxysmal atrial fibrillation.  She was recently admitted to [facility] with right-sided abdominal pain of several days duration along with nausea/intermittent vomiting.  Ultrasound of abdomen revealed findings equivocal for acute calculus cholecystitis with gallbladder wall thickening, gallstones and pericholecystic free fluid.  HIDA scan performed today revealed no gallbladder filling before or after IV morphine likely reflecting cystic duct obstruction.  Patient was evaluated by surgery and deemed a poor surgical candidate.  Request now received for percutaneous cholecystostomy.  Patient currently  afebrile, WBC 14.3, hemoglobin 10.2, PT/INR 15.6/1.3, platelets 121k, potassium 3.1, creatinine 0.43, total bilirubin 1, lactic acid normal, COVID-19 negative, blood/urine cultures pending.  Latest imaging studies were reviewed by Dr. Annamaria Boots.  Details/risks of procedure, including but not limited to, internal bleeding, infection, injury to adjacent structures, need for prolonged drainage discussed with patient and son, Tura Roller ,with their understanding and consent.  Procedure scheduled for this afternoon  Electronically Signed: D. Rowe Robert, PA-C 08/17/2020, 4:02 PM   I spent a total of 30 minutes at the the patient's bedside AND on the patient's hospital floor or unit, greater than 50% of which was counseling/coordinating care for percutaneous cholecystostomy    Patient ID: Lauren Santana, female   DOB: 12/15/1950, 68 y.o.   MRN: [id number]

## 2020-08-18 LAB — COMPREHENSIVE METABOLIC PANEL
ALT: 11 U/L (ref 0–44)
AST: 13 U/L — ABNORMAL LOW (ref 15–41)
Albumin: 2.1 g/dL — ABNORMAL LOW (ref 3.5–5.0)
Alkaline Phosphatase: 61 U/L (ref 38–126)
Anion gap: 9 (ref 5–15)
BUN: 7 mg/dL — ABNORMAL LOW (ref 8–23)
CO2: 19 mmol/L — ABNORMAL LOW (ref 22–32)
Calcium: 8.2 mg/dL — ABNORMAL LOW (ref 8.9–10.3)
Chloride: 108 mmol/L (ref 98–111)
Creatinine, Ser: 0.5 mg/dL (ref 0.44–1.00)
GFR calc Af Amer: >60 mL/min (ref >60)
GFR calc non Af Amer: >60 mL/min (ref >60)
Glucose, Bld: 52 mg/dL — ABNORMAL LOW (ref 70–99)
Potassium: 3.8 mmol/L (ref 3.5–5.1)
Sodium: 136 mmol/L (ref 135–145)
Total Bilirubin: 1.2 mg/dL (ref 0.3–1.2)
Total Protein: 4.8 g/dL — ABNORMAL LOW (ref 6.5–8.1)

## 2020-08-18 LAB — CBC WITH DIFFERENTIAL/PLATELET
Abs Immature Granulocytes: 0.04 10*3/uL (ref 0.00–0.07)
Basophils Absolute: 0 10*3/uL (ref 0.0–0.1)
Basophils Relative: 0 %
Eosinophils Absolute: 0.1 10*3/uL (ref 0.0–0.5)
Eosinophils Relative: 1 %
HCT: 31.3 % — ABNORMAL LOW (ref 36.0–46.0)
Hemoglobin: 9.7 g/dL — ABNORMAL LOW (ref 12.0–15.0)
Immature Granulocytes: 1 %
Lymphocytes Relative: 11 %
Lymphs Abs: 0.9 10*3/uL (ref 0.7–4.0)
MCH: 31.5 pg (ref 26.0–34.0)
MCHC: 31 g/dL (ref 30.0–36.0)
MCV: 101.6 fL — ABNORMAL HIGH (ref 80.0–100.0)
Monocytes Absolute: 1 10*3/uL (ref 0.1–1.0)
Monocytes Relative: 11 %
Neutro Abs: 6.6 10*3/uL (ref 1.7–7.7)
Neutrophils Relative %: 76 %
Platelets: 131 10*3/uL — ABNORMAL LOW (ref 150–400)
RBC: 3.08 MIL/uL — ABNORMAL LOW (ref 3.87–5.11)
RDW: 14.3 % (ref 11.5–15.5)
WBC: 8.7 10*3/uL (ref 4.0–10.5)
nRBC: 0 % (ref 0.0–0.2)

## 2020-08-18 LAB — URINE CULTURE: Culture: NO GROWTH

## 2020-08-18 MED ORDER — MORPHINE SULFATE (PF) 2 MG/ML IV SOLN
1.0000 mg | INTRAVENOUS | Status: DC | PRN
  Filled 2020-08-18: qty 1

## 2020-08-18 MED ORDER — POTASSIUM CHLORIDE 2 MEQ/ML IV SOLN
INTRAVENOUS | Status: DC
  Filled 2020-08-18 (×3): qty 1000

## 2020-08-18 MED ORDER — OXYCODONE-ACETAMINOPHEN 5-325 MG PO TABS
1.0000 | ORAL_TABLET | ORAL | Status: DC | PRN
  Administered 2020-08-18 – 2020-08-24 (×11): 1 via ORAL
  Filled 2020-08-18 (×11): qty 1

## 2020-08-18 MED ORDER — ALTEPLASE 2 MG IJ SOLR
2.0000 mg | Freq: Once | INTRAMUSCULAR | Status: AC
  Administered 2020-08-18: 2 mg
  Filled 2020-08-18: qty 2

## 2020-08-18 MED ORDER — STERILE WATER FOR INJECTION IJ SOLN
INTRAMUSCULAR | Status: AC
  Filled 2020-08-18: qty 10

## 2020-08-18 NOTE — Progress Notes (Addendum)
Referring Physician(s): Dr. Lucia Gaskins  Supervising Physician: Daryll Brod  Patient Status:  [facility] - In-pt  Chief Complaint: Abdominal pain, cholecystitis; s/p percutaneous cholecystostomy 08/17/20  Subjective: Patient sitting up in bed eating lunch. She states she feels much better. Minimal RUQ pain.   Allergies: Tylenol [acetaminophen] and Sulfa antibiotics  Medications: Prior to Admission medications   Medication Sig Start Date End Date Taking? Authorizing Provider  atorvastatin (LIPITOR) 10 MG tablet TAKE ONE TABLET BY MOUTH  ONCE DAILY IN THE EVENING Patient taking differently: Take 10 mg by mouth every evening.  03/18/16  Yes Gildardo Cranker, DO  diclofenac Sodium (VOLTAREN) 1 % GEL Apply 2 g topically daily as needed (pain).  08/11/20  Yes [provider]  enoxaparin (LOVENOX) 80 MG/0.8ML injection Inject 0.8 mLs (80 mg total) into the skin every 12 (twelve) hours. 07/25/20  Yes Dwyane Dee, MD  gabapentin (NEURONTIN) 300 MG capsule Take 300 mg by mouth 3 (three) times daily.   Yes [provider]  medroxyPROGESTERone (PROVERA) 10 MG tablet Take 10 mg by mouth daily.  01/06/20  Yes [provider]  ondansetron (ZOFRAN) 4 MG tablet Take 1 tablet (4 mg total) by mouth 2 (two) times daily as needed for nausea. 07/25/20  Yes Dwyane Dee, MD  oxyCODONE-acetaminophen (PERCOCET) 7.5-325 MG tablet Take 1 tablet by mouth every 6 (six) hours as needed for severe pain. 07/25/20  Yes Dwyane Dee, MD  potassium chloride (KLOR-CON) 10 MEQ tablet Take 10 mEq by mouth 2 (two) times daily. 06/20/20  Yes [provider]  sucralfate (CARAFATE) 1 GM/10ML suspension Take 1 g by mouth 3 (three) times daily before meals. 01/06/20  Yes [provider]     Vital Signs: BP (!) 111/59 (BP Location: Left Arm)   Pulse 91   Temp 98.4 F (36.9 C) (Oral)   Resp (!) 21   Ht 5' 0.98" (1.549 m)   Wt 172 lb 9.9 oz (78.3 kg)   LMP  (LMP Unknown)   SpO2 100%   BMI  32.63 kg/m   Physical Exam Constitutional:      General: She is not in acute distress. Pulmonary:     Effort: Pulmonary effort is normal.  Abdominal:     Palpations: Abdomen is soft.     Comments: RUQ drain in place. Dressing is clean and dry. Drain is to gravity bag with approximately 30 cc bile in bag. Drain easily flushed. Minimal tenderness to palpation.   Neurological:     Mental Status: She is alert.     Imaging: NM Hepatobiliary Liver Func  Result Date: 08/17/2020 CLINICAL DATA:  Abdominal pain. Ultrasound imaging suggesting cholecystitis. EXAM: NUCLEAR MEDICINE HEPATOBILIARY IMAGING TECHNIQUE: Sequential images of the abdomen were obtained out to 60 minutes following intravenous administration of radiopharmaceutical. RADIOPHARMACEUTICALS:  5.5 mCi Tc-42m  Choletec IV COMPARISON:  Ultrasound exam 08/16/2020 FINDINGS: Prompt uptake of radiotracer by the liver parenchyma is evident. Biliary activity is visible by 20 minutes with gut activity noted at 25-30 minutes. After 2 hours of observation, no gallbladder filling is observed. Patient was given 3 mg intravenous morphine and imaged for an additional 30 minutes. A booster dose could not be administered for further imaging due to national radiopharmaceutical shortage. After 30 minutes of observation post morphine, no gallbladder filling is observed. IMPRESSION: 1. Normal hepatobiliary patency without common bile duct obstruction. 2. No gallbladder filling before or after intravenous morphine administration. Imaging after morphine limited by inability to administer a booster dose of  radiopharmaceutical, but imaging features likely reflect cystic duct obstruction. Electronically Signed   By: Misty Stanley M.D.   On: 08/17/2020 14:01   IR Perc Cholecystostomy  Result Date: 08/17/2020 INDICATION: Acute calculus cholecystitis EXAM: ULTRASOUND AND FLUOROSCOPIC PERCUTANEOUS TRANSHEPATIC CHOLECYSTOSTOMY MEDICATIONS: Patient is already receiving  antibiotics as an inpatient.; The antibiotic was administered within an appropriate time frame prior to the initiation of the procedure. ANESTHESIA/SEDATION: Moderate (conscious) sedation was employed during this procedure. A total of Versed 0.5 mg and Fentanyl 25 mcg was administered intravenously. Moderate Sedation Time: 10 minutes. The patient's level of consciousness and vital signs were monitored continuously by radiology nursing throughout the procedure under my direct supervision. FLUOROSCOPY TIME:  Fluoroscopy Time: 0 minutes 48 seconds (10 mGy). COMPLICATIONS: None immediate. PROCEDURE: Informed written consent was obtained from the patient after a thorough discussion of the procedural risks, benefits and alternatives. All questions were addressed. Maximal Sterile Barrier Technique was utilized including caps, mask, sterile gowns, sterile gloves, sterile drape, hand hygiene and skin antiseptic. A timeout was performed prior to the initiation of the procedure. Previous imaging reviewed. Ultrasound was localized in the right upper quadrant. Transhepatic window marked. Under sterile conditions and local anesthesia, a 21 gauge needle was advanced from a right upper quadrant transhepatic approach into the gallbladder. Needle position confirmed with ultrasound. Images obtained for documentation. There was return of exudative bile. 018 guidewire advanced into the gallbladder. Accustick dilator set advanced. Amplatz guidewire exchange performed. Tract dilatation performed to insert a 10 [place] drain. Drain catheter retention loop formed the gallbladder. Position confirmed with fluoroscopy and ultrasound. Images obtained for documentation. Syringe aspiration yielded 20 cc exudative bile. Sample sent for culture. Catheter secured with Prolene suture and connected to external gravity drainage bag. Sterile dressing applied. No immediate complication. Patient tolerated the procedure well. IMPRESSION: Successful  ultrasound and fluoroscopic 10 French cholecystostomy placement. Electronically Signed   By: Jerilynn Mages.  Shick M.D.   On: 08/17/2020 17:47   DG Chest Port 1 View  Result Date: 08/16/2020 CLINICAL DATA:  Questionable sepsis - evaluate for abnormality Abdominal pain. EXAM: PORTABLE CHEST 1 VIEW COMPARISON:  Chest radiograph 07/21/2020.  CT 07/19/2020 FINDINGS: Lung volumes are low. Accessed right chest port with tip in the right atrium, unchanged. Similar cardiomegaly. Unchanged mediastinal contours allowing for rotation. Minor subsegmental atelectasis at the lung bases. No confluent airspace disease. No pneumothorax or large pleural effusion. Degenerative change of both shoulders. IMPRESSION: 1. Low lung volumes with mild bibasilar atelectasis. 2. Unchanged cardiomegaly. Electronically Signed   By: Keith Rake M.D.   On: 08/16/2020 18:03   US Abdomen Limited RUQ  Result Date: 08/16/2020 CLINICAL DATA:  Right upper quadrant abdominal pain EXAM: ULTRASOUND ABDOMEN LIMITED RIGHT UPPER QUADRANT COMPARISON:  CT dated May 22, 2020 FINDINGS: Gallbladder: There is cholelithiasis. The gallbladder wall is thickened measuring approximately 5 mm. There is pericholecystic free fluid. The sonographic Percell Miller sign is reported as negative. Common bile duct: Diameter: 5 mm Liver: No focal lesion identified. Within normal limits in parenchymal echogenicity. Portal vein is patent on color Doppler imaging with normal direction of blood flow towards the liver. Other: None. IMPRESSION: Findings are equivocal for acute calculus cholecystitis. While there is gallbladder wall thickening in the presence of gallstones and pericholecystic free fluid, the sonographic Percell Miller sign is reported as negative. Surgical consultation is recommended. Consider HIDA scan for further evaluation. Electronically Signed   By: Constance Holster M.D.   On: 08/16/2020 17:12    Labs:  CBC: Recent Labs  07/25/20 0329 08/16/20 1441 08/17/20 0051  08/18/20 0905  WBC 6.7 19.7* 14.3* 8.7  HGB 9.4* 12.2 10.2* 9.7*  HCT 30.2* 40.0 32.3* 31.3*  PLT 119* 160 121* 131*    COAGS: Recent Labs    08/16/20 2207 08/17/20 0051  INR 1.3* 1.3*  APTT 29  --     BMP: Recent Labs    07/25/20 0329 08/16/20 1441 08/17/20 0051 08/18/20 0905  NA 137 140 137 136  K 4.5 3.7 3.1* 3.8  CL 108 103 107 108  CO2 22 23 23  19*  GLUCOSE 94 93 85 52*  BUN 8 11 10  7*  CALCIUM 8.6* 9.2 8.2* 8.2*  CREATININE 0.66 0.68 0.43* 0.50  GFRNONAA >60 >60 >60 >60  GFRAA >60 >60 >60 >60    LIVER FUNCTION TESTS: Recent Labs    07/19/20 1202 08/16/20 1441 08/17/20 0051 08/18/20 0905  BILITOT 1.1 1.4* 1.0 1.2  AST 24 15 12* 13*  ALT 15 13 10 11   ALKPHOS 86 77 60 61  PROT 5.7* 6.5 4.7* 4.8*  ALBUMIN 3.0* 3.1* 2.3* 2.1*    Assessment and Plan:  Cholecystitis; s/p cholecystostomy drain 08/17/20: Patient with significantly decreased pain; cholecystostomy to gravity bag with approximately 30 cc of bile in gravity bag. 90 cc output documented in Epic. Drain easily flushed.   Continue to flush drain and document output each shift. Change dressing daily or as needed. Other plans per primary teams.    IR will continue to follow.   Electronically Signed: Soyla Dryer, AGACNP-BC [phone] 08/18/2020, 2:17 PM   I spent a total of 15 Minutes at the the patient's bedside AND on the patient's hospital floor or unit, greater than 50% of which was counseling/coordinating care for cholecystostomy drain care.

## 2020-08-18 NOTE — Progress Notes (Signed)
Assessment & Plan: HD#3 1.  Acute cholecystitis             HIDA scan positive             WBC - 14,300 - 08/17/2020             Flagyl/cefepime              Perc cholecystostomy placed by IR 08/17/2020 2.  History of PE, August 2021 3.  A fib 4.  Diffuse large B cell lymphoma             Being treated at [facility] - last chemotherapy treatment about 6 weeks ago             Dr. Jolayne Haines is her treating oncologist at [facility] exam benign this AM.  Good quality bile in perc drain.  Will follow.         Armandina Gemma, MD       [facility]       Office: [phone]   Chief Complaint: Acute cholecystitis  Subjective: Patient in bed, nursing at bedside.  No complaints - just repeatedly states that she needs help.  Objective: Vital signs in last 24 hours: Temp:  [98.4 F (36.9 C)-100.3 F (37.9 C)] 98.6 F (37 C) (09/25 0531) Pulse Rate:  [94-107] 102 (09/25 0531) Resp:  [14-34] 14 (09/25 0531) BP: (106-137)/(51-65) 137/59 (09/25 0531) SpO2:  [95 %-100 %] 99 % (09/25 0531) Last BM Date: 08/15/20  Intake/Output from previous day: 09/24 0701 - 09/25 0700 In: 483.8 [I.V.:150; IV Piggyback:333.8] Out: 840 [Urine:750; Drains:90] Intake/Output this shift: No intake/output data recorded.  Physical Exam: HEENT - sclerae clear, mucous membranes moist Neck - soft Abdomen - soft without distension; non-tender; drain RUQ with bile in drainage bag  Lab Results:  Recent Labs    08/16/20 1441 08/17/20 0051  WBC 19.7* 14.3*  HGB 12.2 10.2*  HCT 40.0 32.3*  PLT 160 121*   BMET Recent Labs    08/16/20 1441 08/17/20 0051  NA 140 137  K 3.7 3.1*  CL 103 107  CO2 23 23  GLUCOSE 93 85  BUN 11 10  CREATININE 0.68 0.43*  CALCIUM 9.2 8.2*   PT/INR Recent Labs    08/16/20 2207 08/17/20 0051  LABPROT 15.7* 15.6*  INR 1.3* 1.3*   Comprehensive Metabolic Panel:    Component Value Date/Time   NA 137 08/17/2020 0051   NA 140  08/16/2020 1441   NA 142 01/03/2019 1352   K 3.1 (L) 08/17/2020 0051   K 3.7 08/16/2020 1441   CL 107 08/17/2020 0051   CL 103 08/16/2020 1441   CO2 23 08/17/2020 0051   CO2 23 08/16/2020 1441   BUN 10 08/17/2020 0051   BUN 11 08/16/2020 1441   BUN 8 01/03/2019 1352   CREATININE 0.43 (L) 08/17/2020 0051   CREATININE 0.68 08/16/2020 1441   GLUCOSE 85 08/17/2020 0051   GLUCOSE 93 08/16/2020 1441   CALCIUM 8.2 (L) 08/17/2020 0051   CALCIUM 9.2 08/16/2020 1441   AST 12 (L) 08/17/2020 0051   AST 15 08/16/2020 1441   ALT 10 08/17/2020 0051   ALT 13 08/16/2020 1441   ALKPHOS 60 08/17/2020 0051   ALKPHOS 77 08/16/2020 1441   BILITOT 1.0 08/17/2020 0051   BILITOT 1.4 (H) 08/16/2020 1441   PROT 4.7 (L) 08/17/2020 0051   PROT 6.5 08/16/2020 1441   ALBUMIN 2.3 (L) 08/17/2020  0051   ALBUMIN 3.1 (L) 08/16/2020 1441    Studies/Results: NM Hepatobiliary Liver Func  Result Date: 08/17/2020 CLINICAL DATA:  Abdominal pain. Ultrasound imaging suggesting cholecystitis. EXAM: NUCLEAR MEDICINE HEPATOBILIARY IMAGING TECHNIQUE: Sequential images of the abdomen were obtained out to 60 minutes following intravenous administration of radiopharmaceutical. RADIOPHARMACEUTICALS:  5.5 mCi Tc-35m  Choletec IV COMPARISON:  Ultrasound exam 08/16/2020 FINDINGS: Prompt uptake of radiotracer by the liver parenchyma is evident. Biliary activity is visible by 20 minutes with gut activity noted at 25-30 minutes. After 2 hours of observation, no gallbladder filling is observed. Patient was given 3 mg intravenous morphine and imaged for an additional 30 minutes. A booster dose could not be administered for further imaging due to national radiopharmaceutical shortage. After 30 minutes of observation post morphine, no gallbladder filling is observed. IMPRESSION: 1. Normal hepatobiliary patency without common bile duct obstruction. 2. No gallbladder filling before or after intravenous morphine administration. Imaging after  morphine limited by inability to administer a booster dose of radiopharmaceutical, but imaging features likely reflect cystic duct obstruction. Electronically Signed   By: Misty Stanley M.D.   On: 08/17/2020 14:01   IR Perc Cholecystostomy  Result Date: 08/17/2020 INDICATION: Acute calculus cholecystitis EXAM: ULTRASOUND AND FLUOROSCOPIC PERCUTANEOUS TRANSHEPATIC CHOLECYSTOSTOMY MEDICATIONS: Patient is already receiving antibiotics as an inpatient.; The antibiotic was administered within an appropriate time frame prior to the initiation of the procedure. ANESTHESIA/SEDATION: Moderate (conscious) sedation was employed during this procedure. A total of Versed 0.5 mg and Fentanyl 25 mcg was administered intravenously. Moderate Sedation Time: 10 minutes. The patient's level of consciousness and vital signs were monitored continuously by radiology nursing throughout the procedure under my direct supervision. FLUOROSCOPY TIME:  Fluoroscopy Time: 0 minutes 48 seconds (10 mGy). COMPLICATIONS: None immediate. PROCEDURE: Informed written consent was obtained from the patient after a thorough discussion of the procedural risks, benefits and alternatives. All questions were addressed. Maximal Sterile Barrier Technique was utilized including caps, mask, sterile gowns, sterile gloves, sterile drape, hand hygiene and skin antiseptic. A timeout was performed prior to the initiation of the procedure. Previous imaging reviewed. Ultrasound was localized in the right upper quadrant. Transhepatic window marked. Under sterile conditions and local anesthesia, a 21 gauge needle was advanced from a right upper quadrant transhepatic approach into the gallbladder. Needle position confirmed with ultrasound. Images obtained for documentation. There was return of exudative bile. 018 guidewire advanced into the gallbladder. Accustick dilator set advanced. Amplatz guidewire exchange performed. Tract dilatation performed to insert a 10 [place]  drain. Drain catheter retention loop formed the gallbladder. Position confirmed with fluoroscopy and ultrasound. Images obtained for documentation. Syringe aspiration yielded 20 cc exudative bile. Sample sent for culture. Catheter secured with Prolene suture and connected to external gravity drainage bag. Sterile dressing applied. No immediate complication. Patient tolerated the procedure well. IMPRESSION: Successful ultrasound and fluoroscopic 10 French cholecystostomy placement. Electronically Signed   By: Jerilynn Mages.  Shick M.D.   On: 08/17/2020 17:47   DG Chest Port 1 View  Result Date: 08/16/2020 CLINICAL DATA:  Questionable sepsis - evaluate for abnormality Abdominal pain. EXAM: PORTABLE CHEST 1 VIEW COMPARISON:  Chest radiograph 07/21/2020.  CT 07/19/2020 FINDINGS: Lung volumes are low. Accessed right chest port with tip in the right atrium, unchanged. Similar cardiomegaly. Unchanged mediastinal contours allowing for rotation. Minor subsegmental atelectasis at the lung bases. No confluent airspace disease. No pneumothorax or large pleural effusion. Degenerative change of both shoulders. IMPRESSION: 1. Low lung volumes with mild bibasilar atelectasis. 2. Unchanged cardiomegaly.  Electronically Signed   By: Keith Rake M.D.   On: 08/16/2020 18:03   US Abdomen Limited RUQ  Result Date: 08/16/2020 CLINICAL DATA:  Right upper quadrant abdominal pain EXAM: ULTRASOUND ABDOMEN LIMITED RIGHT UPPER QUADRANT COMPARISON:  CT dated May 22, 2020 FINDINGS: Gallbladder: There is cholelithiasis. The gallbladder wall is thickened measuring approximately 5 mm. There is pericholecystic free fluid. The sonographic Percell Miller sign is reported as negative. Common bile duct: Diameter: 5 mm Liver: No focal lesion identified. Within normal limits in parenchymal echogenicity. Portal vein is patent on color Doppler imaging with normal direction of blood flow towards the liver. Other: None. IMPRESSION: Findings are equivocal for acute  calculus cholecystitis. While there is gallbladder wall thickening in the presence of gallstones and pericholecystic free fluid, the sonographic Percell Miller sign is reported as negative. Surgical consultation is recommended. Consider HIDA scan for further evaluation. Electronically Signed   By: Constance Holster M.D.   On: 08/16/2020 17:12      Armandina Gemma 08/18/2020  Patient ID: [facility], female   DOB: 1951-02-22, 69 y.o.   MRN: [id number]

## 2020-08-18 NOTE — Progress Notes (Signed)
PROGRESS NOTE    Lauren Santana [facility]  KGU:[id number] DOB: 09/27/1951 DOA: 08/16/2020 PCP: Lucianne Lei, MD  Brief Narrative:   69 year old black female [place] (follows with Dr. Jolayne Haines) pulmonary embolism 2017 Rx 6 months Xarelto For history of endometriosis not amenable to surgical resection DM, HTN, HLD Hospitalized 8/26 through 9/1 large pulmonary embolism-started back on anticoagulation-developed A. fib RVR but was ultimately sent to skilled Returns to hospital 9/23 1 month nausea vomiting-in ED low-grade temp tachycardia tachyarrhythmia Lactic acid 3.7 gap 14 bilirubin 1.4 WBC 19 Abdominal ultrasound = acute calculus cholecystitis Rx IV cefepime Flagyl GI and general surgery consult by emergency room  Ultimately it was decided that patient was a very poor surgical candidate and that patient would need PERC drain placement Her drain was placed on 9/20 4 PM she had significant pain needing pain meds    Assessment & Plan:   Principal Problem:   Cholecystitis, acute Active Problems:   Essential hypertension, benign   Pulmonary emboli (HCC)   Atrial fibrillation with RVR (HCC)   Anemia of chronic disease   Chronic pain   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HCC)   Abdominal pain   Severe sepsis (HCC)   Pressure injury of skin   1. Sepsis secondary to possible cholecystitis a. Received goal-directed fluid therapy-lactic acidosis resolved b. Continue low-dose fluids with 40 mEq of potassium and likely saline lock a.m. if tolerating diet c. Percutaneous cholecystostomy placed 9/24 as poor surgical candidate d. Continue cefepime/Flagyl at this time-White count improved e. Drain management as per IR-palpable drain study in the outpatient setting-do not know if she will ever be a candidate for operative management of cholecystitis-surgeon to comment 2. Cholecystotomy a. Pain control with Percocet 1 tab every 4 as needed moderate pain b. For severe  pain use morphine 1 mg every 3 as needed 3. Recent large saddle pulmonary embolism on heparin currently a. Transition back to weight-based Lovenox every 12 given active cancer b. Outpatient follow-up 4. Hypokalemia a. Repeat labs including magnesium in the morning for sepsis on admission 5. DLBLC followed at [facility] a. Outpatient follow-up with [facility] 6. DM TY 2 A1c 4.6 this admission a. Patient was n.p.o. - CBGs ranging 50s to 80s monitor trends alone if eating would stop aggressive checks 7. HTN a. History of no meds at present-monitor 8. HLD 9. Prior endometriosis a. Not amenable to surgery-Provera can worsen risk for VTE-defer this decision making to her OB/GYN b. Hold for now c. Outpatient follow-up  DVT prophylaxis: Heparin to be resumed Code Status: Full Family Communication: called son [name] [phone] on 9/24 Disposition:   Status is: Inpatient  Remains inpatient appropriate because:Hemodynamically unstable, Persistent severe electrolyte disturbances, Altered mental status and Ongoing diagnostic testing needed not appropriate for outpatient work up   Dispo: The patient is from: Home              Anticipated d/c is to: SNF              Anticipated d/c date is: 2 days              Patient currently is not medically stable to d/c.   Consultants:   gen surgery  Procedures: Hida  Antimicrobials: flagyl/cefepime   Subjective: Asking for food has been n.p.o. No pain no fever Still having severe pain although seems quite comfortable at the bedside   Objective: Vitals:   08/17/20 2245 08/18/20 0159 08/18/20 0531 08/18/20 1023  BP:  (!) 122/57 (!) 137/59 (!) 133/53  Pulse:  (!) 106 (!) 102 92  Resp:  14 14 (!) 22  Temp: 100.3 F (37.9 C) 99.3 F (37.4 C) 98.6 F (37 C) 98.6 F (37 C)  TempSrc: Oral Oral Oral Oral  SpO2:  100% 99% 100%  Weight:      Height:        Intake/Output Summary (Last 24 hours) at 08/18/2020 1306 Last data filed at 08/18/2020  0532 Gross per 24 hour  Intake 483.77 ml  Output 840 ml  Net -356.23 ml   Filed Weights   08/16/20 2138  Weight: 78.3 kg    Examination:  Arousable not sleepy today alert asking for food Chest clear no added sound no rales no rhonchi Abdomen soft.  Drain in place no rebound S1-S2 no murmur rub or gallop ROM intact extremities no  Data Reviewed: I have personally reviewed following labs and imaging studies Potassium 3.1-->3.8 BUN/creatinine 10/0.4-->7/0.5 WBC 14-->8.7 Hemoglobin 10-has been >9.7 Platelet 121-->131  Radiology Studies: NM Hepatobiliary Liver Func  Result Date: 08/17/2020 CLINICAL DATA:  Abdominal pain. Ultrasound imaging suggesting cholecystitis. EXAM: NUCLEAR MEDICINE HEPATOBILIARY IMAGING TECHNIQUE: Sequential images of the abdomen were obtained out to 60 minutes following intravenous administration of radiopharmaceutical. RADIOPHARMACEUTICALS:  5.5 mCi Tc-26m  Choletec IV COMPARISON:  Ultrasound exam 08/16/2020 FINDINGS: Prompt uptake of radiotracer by the liver parenchyma is evident. Biliary activity is visible by 20 minutes with gut activity noted at 25-30 minutes. After 2 hours of observation, no gallbladder filling is observed. Patient was given 3 mg intravenous morphine and imaged for an additional 30 minutes. A booster dose could not be administered for further imaging due to national radiopharmaceutical shortage. After 30 minutes of observation post morphine, no gallbladder filling is observed. IMPRESSION: 1. Normal hepatobiliary patency without common bile duct obstruction. 2. No gallbladder filling before or after intravenous morphine administration. Imaging after morphine limited by inability to administer a booster dose of radiopharmaceutical, but imaging features likely reflect cystic duct obstruction. Electronically Signed   By: Misty Stanley M.D.   On: 08/17/2020 14:01   IR Perc Cholecystostomy  Result Date: 08/17/2020 INDICATION: Acute calculus  cholecystitis EXAM: ULTRASOUND AND FLUOROSCOPIC PERCUTANEOUS TRANSHEPATIC CHOLECYSTOSTOMY MEDICATIONS: Patient is already receiving antibiotics as an inpatient.; The antibiotic was administered within an appropriate time frame prior to the initiation of the procedure. ANESTHESIA/SEDATION: Moderate (conscious) sedation was employed during this procedure. A total of Versed 0.5 mg and Fentanyl 25 mcg was administered intravenously. Moderate Sedation Time: 10 minutes. The patient's level of consciousness and vital signs were monitored continuously by radiology nursing throughout the procedure under my direct supervision. FLUOROSCOPY TIME:  Fluoroscopy Time: 0 minutes 48 seconds (10 mGy). COMPLICATIONS: None immediate. PROCEDURE: Informed written consent was obtained from the patient after a thorough discussion of the procedural risks, benefits and alternatives. All questions were addressed. Maximal Sterile Barrier Technique was utilized including caps, mask, sterile gowns, sterile gloves, sterile drape, hand hygiene and skin antiseptic. A timeout was performed prior to the initiation of the procedure. Previous imaging reviewed. Ultrasound was localized in the right upper quadrant. Transhepatic window marked. Under sterile conditions and local anesthesia, a 21 gauge needle was advanced from a right upper quadrant transhepatic approach into the gallbladder. Needle position confirmed with ultrasound. Images obtained for documentation. There was return of exudative bile. 018 guidewire advanced into the gallbladder. Accustick dilator set advanced. Amplatz guidewire exchange performed. Tract dilatation performed to insert a 10 [place] drain. Drain catheter  retention loop formed the gallbladder. Position confirmed with fluoroscopy and ultrasound. Images obtained for documentation. Syringe aspiration yielded 20 cc exudative bile. Sample sent for culture. Catheter secured with Prolene suture and connected to external gravity  drainage bag. Sterile dressing applied. No immediate complication. Patient tolerated the procedure well. IMPRESSION: Successful ultrasound and fluoroscopic 10 French cholecystostomy placement. Electronically Signed   By: Jerilynn Mages.  Shick M.D.   On: 08/17/2020 17:47   DG Chest Port 1 View  Result Date: 08/16/2020 CLINICAL DATA:  Questionable sepsis - evaluate for abnormality Abdominal pain. EXAM: PORTABLE CHEST 1 VIEW COMPARISON:  Chest radiograph 07/21/2020.  CT 07/19/2020 FINDINGS: Lung volumes are low. Accessed right chest port with tip in the right atrium, unchanged. Similar cardiomegaly. Unchanged mediastinal contours allowing for rotation. Minor subsegmental atelectasis at the lung bases. No confluent airspace disease. No pneumothorax or large pleural effusion. Degenerative change of both shoulders. IMPRESSION: 1. Low lung volumes with mild bibasilar atelectasis. 2. Unchanged cardiomegaly. Electronically Signed   By: Keith Rake M.D.   On: 08/16/2020 18:03   US Abdomen Limited RUQ  Result Date: 08/16/2020 CLINICAL DATA:  Right upper quadrant abdominal pain EXAM: ULTRASOUND ABDOMEN LIMITED RIGHT UPPER QUADRANT COMPARISON:  CT dated May 22, 2020 FINDINGS: Gallbladder: There is cholelithiasis. The gallbladder wall is thickened measuring approximately 5 mm. There is pericholecystic free fluid. The sonographic Percell Miller sign is reported as negative. Common bile duct: Diameter: 5 mm Liver: No focal lesion identified. Within normal limits in parenchymal echogenicity. Portal vein is patent on color Doppler imaging with normal direction of blood flow towards the liver. Other: None. IMPRESSION: Findings are equivocal for acute calculus cholecystitis. While there is gallbladder wall thickening in the presence of gallstones and pericholecystic free fluid, the sonographic Percell Miller sign is reported as negative. Surgical consultation is recommended. Consider HIDA scan for further evaluation. Electronically Signed   By:  Constance Holster M.D.   On: 08/16/2020 17:12     Scheduled Meds: . sterile water (preservative free)      . alteplase  2 mg Intracatheter Once  . Chlorhexidine Gluconate Cloth  6 each Topical Daily  . enoxaparin (LOVENOX) injection  80 mg Subcutaneous BID  . sodium chloride flush  10-40 mL Intracatheter Q12H   Continuous Infusions: . ceFEPime (MAXIPIME) IV 2 g (08/18/20 0518)  . lactated ringers Stopped (08/16/20 2211)  . metronidazole 500 mg (08/18/20 0553)     LOS: 2 days    Time spent: 93  Nita Sells, MD [facility] To contact the attending provider between 7A-7P or the covering provider during after hours 7P-7A, please log into the web site [email or web address] and access using universal [place] password for that web site. If you do not have the password, please call the hospital operator.  08/18/2020, 1:06 PM

## 2020-08-19 LAB — COMPREHENSIVE METABOLIC PANEL
ALT: 10 U/L (ref 0–44)
AST: 12 U/L — ABNORMAL LOW (ref 15–41)
Albumin: 2.1 g/dL — ABNORMAL LOW (ref 3.5–5.0)
Alkaline Phosphatase: 64 U/L (ref 38–126)
Anion gap: 6 (ref 5–15)
BUN: 5 mg/dL — ABNORMAL LOW (ref 8–23)
CO2: 23 mmol/L (ref 22–32)
Calcium: 8.3 mg/dL — ABNORMAL LOW (ref 8.9–10.3)
Chloride: 106 mmol/L (ref 98–111)
Creatinine, Ser: 0.4 mg/dL — ABNORMAL LOW (ref 0.44–1.00)
GFR calc Af Amer: >60 mL/min (ref >60)
GFR calc non Af Amer: >60 mL/min (ref >60)
Glucose, Bld: 89 mg/dL (ref 70–99)
Potassium: 3.5 mmol/L (ref 3.5–5.1)
Sodium: 135 mmol/L (ref 135–145)
Total Bilirubin: 0.6 mg/dL (ref 0.3–1.2)
Total Protein: 4.8 g/dL — ABNORMAL LOW (ref 6.5–8.1)

## 2020-08-19 LAB — CBC WITH DIFFERENTIAL/PLATELET
Abs Immature Granulocytes: 0.03 10*3/uL (ref 0.00–0.07)
Basophils Absolute: 0 10*3/uL (ref 0.0–0.1)
Basophils Relative: 1 %
Eosinophils Absolute: 0.3 10*3/uL (ref 0.0–0.5)
Eosinophils Relative: 5 %
HCT: 29.9 % — ABNORMAL LOW (ref 36.0–46.0)
Hemoglobin: 9.5 g/dL — ABNORMAL LOW (ref 12.0–15.0)
Immature Granulocytes: 1 %
Lymphocytes Relative: 18 %
Lymphs Abs: 1.1 10*3/uL (ref 0.7–4.0)
MCH: 31.6 pg (ref 26.0–34.0)
MCHC: 31.8 g/dL (ref 30.0–36.0)
MCV: 99.3 fL (ref 80.0–100.0)
Monocytes Absolute: 0.9 10*3/uL (ref 0.1–1.0)
Monocytes Relative: 15 %
Neutro Abs: 3.6 10*3/uL (ref 1.7–7.7)
Neutrophils Relative %: 60 %
Platelets: 151 10*3/uL (ref 150–400)
RBC: 3.01 MIL/uL — ABNORMAL LOW (ref 3.87–5.11)
RDW: 14.4 % (ref 11.5–15.5)
WBC: 5.9 10*3/uL (ref 4.0–10.5)
nRBC: 0 % (ref 0.0–0.2)

## 2020-08-19 LAB — MAGNESIUM: Magnesium: 1.8 mg/dL (ref 1.7–2.4)

## 2020-08-19 NOTE — Plan of Care (Signed)
  Problem: Pain Managment: Goal: General experience of comfort will improve Outcome: Progressing   Problem: Skin Integrity: Goal: Risk for impaired skin integrity will decrease Outcome: Progressing   

## 2020-08-19 NOTE — Progress Notes (Signed)
PROGRESS NOTE    Lauren Santana [facility]  EVO:[id number] DOB: [birth year] DOA: 08/16/2020 PCP: Lucianne Lei, MD  Brief Narrative:   69 year old black female [place] (follows with Dr. Jolayne Haines) pulmonary embolism 2017 Rx 6 months Xarelto For history of endometriosis not amenable to surgical resection DM, HTN, HLD Hospitalized 8/26 through 9/1 large pulmonary embolism-started back on anticoagulation-developed A. fib RVR but was ultimately sent to skilled Returns to hospital 9/23 1 month nausea vomiting-in ED low-grade temp tachycardia tachyarrhythmia Lactic acid 3.7 gap 14 bilirubin 1.4 WBC 19 Abdominal ultrasound = acute calculus cholecystitis Rx IV cefepime Flagyl GI and general surgery consult by emergency room  Ultimately it was decided that patient was a very poor surgical candidate and that patient would need PERC drain placement Her drain was placed on 9/24 PM she had significant pain needing pain meds which has subsequently improved  She is stabilizing    Assessment & Plan:   Principal Problem:   Cholecystitis, acute Active Problems:   Essential hypertension, benign   Pulmonary emboli (HCC)   Atrial fibrillation with RVR (HCC)   Anemia of chronic disease   Chronic pain   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HCC)   Abdominal pain   Severe sepsis (HCC)   Pressure injury of skin   1. Sepsis secondary to possible cholecystitis a. Received goal-directed fluid therapy-lactic acidosis resolved b. Saline lock LR +40 K@50cc /h  9.26.2021 c. Percutaneous cholecystostomy placed 9/24 as poor surgical candidate-outpatient surgical options to be weighed once discharged d. Continue cefepime/Flagyl at this time-White count improved-narrow in the next 24 to 48 hours depending on final cultures 2. Cholecystotomy a. Pain control with Percocet 1 tab every 4 as needed moderate pain b. For severe pain use morphine 1 mg every 3 as needed 3. Recent large saddle  pulmonary embolism on heparin currently a. Post procedure have resumed Lovenox every 12 given active cancer b. Outpatient follow-up 4. Hypokalemia a. Resolved-saline lock 5. DLBLC followed at [facility] a. Outpatient follow-up with [facility] 6. DM TY 2 A1c 4.6 this admission a. Patient was n.p.o. - CBGs ranging 50s to 80s monitor trends alone if eating would stop aggressive checks 7. HTN a. History of no meds at present-monitor 8. HLD 9. Prior endometriosis a. Not amenable to surgery-Provera can worsen risk for VTE-defer this decision making to her OB/GYN b. Hold for now c. Outpatient follow-up  DVT prophylaxis: lovenox to be resumed Code Status: Full Family Communication: called son [name] [phone] on 9/24 and updated again on phone on 08/19/2020  Disposition:   Status is: Inpatient  Remains inpatient appropriate because:Hemodynamically unstable, Persistent severe electrolyte disturbances, Altered mental status and Ongoing diagnostic testing needed not appropriate for outpatient work up   Dispo: The patient is from: Home              Anticipated d/c is to: SNF              Anticipated d/c date is: 2 days              Patient currently is not medically stable to d/c.   Consultants:   gen surgery  Procedures: Hida Percutaneous cholecystotomy under  IR 9/24  Antimicrobials: flagyl/cefepime   Subjective:  Awake alert coherent no distress eating some ice cream but not tolerating diet does not seem to like it Pain seems controlled Has not been out of bed since admission No fever no chills  Objective: Vitals:   08/18/20 1840 08/18/20  2147 08/19/20 0515 08/19/20 1217  BP: 129/64 (!) 114/56 124/62 127/66  Pulse: 80 89 88 93  Resp: (!) 22 18 20 17   Temp: 98.5 F (36.9 C) 98.5 F (36.9 C) 98.4 F (36.9 C) 98.8 F (37.1 C)  TempSrc: Oral Oral  Oral  SpO2: 100% 99% 100% 100%  Weight:      Height:        Intake/Output Summary (Last 24 hours) at 08/19/2020  1254 Last data filed at 08/19/2020 1252 Gross per 24 hour  Intake 826.89 ml  Output 3040 ml  Net -2213.11 ml   Filed Weights   08/16/20 2138  Weight: 78.3 kg    Examination:  Arousable alert no distress Chest clear no added sound no rales no rhonchi Soft nontender abdomen no rebound no guarding drain in place draining well S1-S2 no murmur rub or gallop No lower extremity edema  Data Reviewed: I have personally reviewed following labs and imaging studies Potassium 3.1-->3.8-->3.5 BUN/creatinine 10/0.4-->7/0.5-->5/0.4 WBC 14-->8.7 -->5.9  hEmoglobin 10-has been >9.7-->9.5 Platelet 121--> 151  Radiology Studies: NM Hepatobiliary Liver Func  Result Date: 08/17/2020 CLINICAL DATA:  Abdominal pain. Ultrasound imaging suggesting cholecystitis. EXAM: NUCLEAR MEDICINE HEPATOBILIARY IMAGING TECHNIQUE: Sequential images of the abdomen were obtained out to 60 minutes following intravenous administration of radiopharmaceutical. RADIOPHARMACEUTICALS:  5.5 mCi Tc-42m  Choletec IV COMPARISON:  Ultrasound exam 08/16/2020 FINDINGS: Prompt uptake of radiotracer by the liver parenchyma is evident. Biliary activity is visible by 20 minutes with gut activity noted at 25-30 minutes. After 2 hours of observation, no gallbladder filling is observed. Patient was given 3 mg intravenous morphine and imaged for an additional 30 minutes. A booster dose could not be administered for further imaging due to national radiopharmaceutical shortage. After 30 minutes of observation post morphine, no gallbladder filling is observed. IMPRESSION: 1. Normal hepatobiliary patency without common bile duct obstruction. 2. No gallbladder filling before or after intravenous morphine administration. Imaging after morphine limited by inability to administer a booster dose of radiopharmaceutical, but imaging features likely reflect cystic duct obstruction. Electronically Signed   By: Misty Stanley M.D.   On: 08/17/2020 14:01   IR Perc  Cholecystostomy  Result Date: 08/17/2020 INDICATION: Acute calculus cholecystitis EXAM: ULTRASOUND AND FLUOROSCOPIC PERCUTANEOUS TRANSHEPATIC CHOLECYSTOSTOMY MEDICATIONS: Patient is already receiving antibiotics as an inpatient.; The antibiotic was administered within an appropriate time frame prior to the initiation of the procedure. ANESTHESIA/SEDATION: Moderate (conscious) sedation was employed during this procedure. A total of Versed 0.5 mg and Fentanyl 25 mcg was administered intravenously. Moderate Sedation Time: 10 minutes. The patient's level of consciousness and vital signs were monitored continuously by radiology nursing throughout the procedure under my direct supervision. FLUOROSCOPY TIME:  Fluoroscopy Time: 0 minutes 48 seconds (10 mGy). COMPLICATIONS: None immediate. PROCEDURE: Informed written consent was obtained from the patient after a thorough discussion of the procedural risks, benefits and alternatives. All questions were addressed. Maximal Sterile Barrier Technique was utilized including caps, mask, sterile gowns, sterile gloves, sterile drape, hand hygiene and skin antiseptic. A timeout was performed prior to the initiation of the procedure. Previous imaging reviewed. Ultrasound was localized in the right upper quadrant. Transhepatic window marked. Under sterile conditions and local anesthesia, a 21 gauge needle was advanced from a right upper quadrant transhepatic approach into the gallbladder. Needle position confirmed with ultrasound. Images obtained for documentation. There was return of exudative bile. 018 guidewire advanced into the gallbladder. Accustick dilator set advanced. Amplatz guidewire exchange performed. Tract dilatation performed to insert a  10 French drain. Drain catheter retention loop formed the gallbladder. Position confirmed with fluoroscopy and ultrasound. Images obtained for documentation. Syringe aspiration yielded 20 cc exudative bile. Sample sent for culture.  Catheter secured with Prolene suture and connected to external gravity drainage bag. Sterile dressing applied. No immediate complication. Patient tolerated the procedure well. IMPRESSION: Successful ultrasound and fluoroscopic 10 French cholecystostomy placement. Electronically Signed   By: Jerilynn Mages.  Shick M.D.   On: 08/17/2020 17:47     Scheduled Meds: . Chlorhexidine Gluconate Cloth  6 each Topical Daily  . enoxaparin (LOVENOX) injection  80 mg Subcutaneous BID  . sodium chloride flush  10-40 mL Intracatheter Q12H   Continuous Infusions: . ceFEPime (MAXIPIME) IV 2 g (08/19/20 0539)  . lactated ringers 1,000 mL with potassium chloride 40 mEq infusion 50 mL/hr at 08/18/20 1809  . lactated ringers Stopped (08/16/20 2211)  . metronidazole 500 mg (08/19/20 0619)     LOS: 3 days    Time spent: [place], MD [facility] To contact the attending provider between 7A-7P or the covering provider during after hours 7P-7A, please log into the web site [email or web address] and access using universal  password for that web site. If you do not have the password, please call the hospital operator.  08/19/2020, 12:54 PM

## 2020-08-19 NOTE — Progress Notes (Signed)
Assessment & Plan: HD#4 1.Acutecholecystitis WBC improved, down to 5.9 this AM Flagyl/cefepime  Perc cholecystostomy - IR 08/17/2020 2. History of PE, August 2021 3. A fib 4. Diffuse large B cell lymphoma Being treated at [facility] - last chemotherapy treatment about 6 weeks ago Dr. Jolayne Haines is her treating oncologist at [facility] exam benign this AM.  Good quality bile in perc drain. Discussion regarding eventual operative management will be delayed several weeks and depend on overall medical progress.  Patient appears much improved this AM.  Will follow.  Encourage oral intake, nutritional support.        Armandina Gemma, MD       [facility]       Office: [phone]   Chief Complaint: Acute cholecystitis  Subjective: Patient much improved, brighter, more conversant.  Doesn't want to eat yet.  Objective: Vital signs in last 24 hours: Temp:  [98.4 F (36.9 C)-98.6 F (37 C)] 98.4 F (36.9 C) (09/26 0515) Pulse Rate:  [80-92] 88 (09/26 0515) Resp:  [18-22] 20 (09/26 0515) BP: (111-133)/(53-64) 124/62 (09/26 0515) SpO2:  [99 %-100 %] 100 % (09/26 0515) Last BM Date: 08/15/20  Intake/Output from previous day: 09/25 0701 - 09/26 0700 In: 723.5 [P.O.:720; I.V.:3.5] Out: 2240 [Urine:2200; Drains:40] Intake/Output this shift: No intake/output data recorded.  Physical Exam: HEENT - sclerae clear, mucous membranes moist Neck - soft Abdomen - soft, non-distended; no mass; no tenderness  Lab Results:  Recent Labs    08/18/20 0905 08/19/20 0436  WBC 8.7 5.9  HGB 9.7* 9.5*  HCT 31.3* 29.9*  PLT 131* 151   BMET Recent Labs    08/18/20 0905 08/19/20 0436  NA 136 135  K 3.8 3.5  CL 108 106  CO2 19* 23  GLUCOSE 52* 89  BUN 7* 5*  CREATININE 0.50 0.40*  CALCIUM 8.2* 8.3*   PT/INR Recent Labs    08/16/20 2207 08/17/20 0051  LABPROT 15.7* 15.6*  INR 1.3*  1.3*   Comprehensive Metabolic Panel:    Component Value Date/Time   NA 135 08/19/2020 0436   NA 136 08/18/2020 0905   NA 142 01/03/2019 1352   K 3.5 08/19/2020 0436   K 3.8 08/18/2020 0905   CL 106 08/19/2020 0436   CL 108 08/18/2020 0905   CO2 23 08/19/2020 0436   CO2 19 (L) 08/18/2020 0905   BUN 5 (L) 08/19/2020 0436   BUN 7 (L) 08/18/2020 0905   BUN 8 01/03/2019 1352   CREATININE 0.40 (L) 08/19/2020 0436   CREATININE 0.50 08/18/2020 0905   GLUCOSE 89 08/19/2020 0436   GLUCOSE 52 (L) 08/18/2020 0905   CALCIUM 8.3 (L) 08/19/2020 0436   CALCIUM 8.2 (L) 08/18/2020 0905   AST 12 (L) 08/19/2020 0436   AST 13 (L) 08/18/2020 0905   ALT 10 08/19/2020 0436   ALT 11 08/18/2020 0905   ALKPHOS 64 08/19/2020 0436   ALKPHOS 61 08/18/2020 0905   BILITOT 0.6 08/19/2020 0436   BILITOT 1.2 08/18/2020 0905   PROT 4.8 (L) 08/19/2020 0436   PROT 4.8 (L) 08/18/2020 0905   ALBUMIN 2.1 (L) 08/19/2020 0436   ALBUMIN 2.1 (L) 08/18/2020 0905    Studies/Results: NM Hepatobiliary Liver Func  Result Date: 08/17/2020 CLINICAL DATA:  Abdominal pain. Ultrasound imaging suggesting cholecystitis. EXAM: NUCLEAR MEDICINE HEPATOBILIARY IMAGING TECHNIQUE: Sequential images of the abdomen were obtained out to 60 minutes following intravenous administration of radiopharmaceutical. RADIOPHARMACEUTICALS:  5.5 mCi Tc-10m  Choletec  IV COMPARISON:  Ultrasound exam 08/16/2020 FINDINGS: Prompt uptake of radiotracer by the liver parenchyma is evident. Biliary activity is visible by 20 minutes with gut activity noted at 25-30 minutes. After 2 hours of observation, no gallbladder filling is observed. Patient was given 3 mg intravenous morphine and imaged for an additional 30 minutes. A booster dose could not be administered for further imaging due to national radiopharmaceutical shortage. After 30 minutes of observation post morphine, no gallbladder filling is observed. IMPRESSION: 1. Normal hepatobiliary patency without  common bile duct obstruction. 2. No gallbladder filling before or after intravenous morphine administration. Imaging after morphine limited by inability to administer a booster dose of radiopharmaceutical, but imaging features likely reflect cystic duct obstruction. Electronically Signed   By: Misty Stanley M.D.   On: 08/17/2020 14:01   IR Perc Cholecystostomy  Result Date: 08/17/2020 INDICATION: Acute calculus cholecystitis EXAM: ULTRASOUND AND FLUOROSCOPIC PERCUTANEOUS TRANSHEPATIC CHOLECYSTOSTOMY MEDICATIONS: Patient is already receiving antibiotics as an inpatient.; The antibiotic was administered within an appropriate time frame prior to the initiation of the procedure. ANESTHESIA/SEDATION: Moderate (conscious) sedation was employed during this procedure. A total of Versed 0.5 mg and Fentanyl 25 mcg was administered intravenously. Moderate Sedation Time: 10 minutes. The patient's level of consciousness and vital signs were monitored continuously by radiology nursing throughout the procedure under my direct supervision. FLUOROSCOPY TIME:  Fluoroscopy Time: 0 minutes 48 seconds (10 mGy). COMPLICATIONS: None immediate. PROCEDURE: Informed written consent was obtained from the patient after a thorough discussion of the procedural risks, benefits and alternatives. All questions were addressed. Maximal Sterile Barrier Technique was utilized including caps, mask, sterile gowns, sterile gloves, sterile drape, hand hygiene and skin antiseptic. A timeout was performed prior to the initiation of the procedure. Previous imaging reviewed. Ultrasound was localized in the right upper quadrant. Transhepatic window marked. Under sterile conditions and local anesthesia, a 21 gauge needle was advanced from a right upper quadrant transhepatic approach into the gallbladder. Needle position confirmed with ultrasound. Images obtained for documentation. There was return of exudative bile. 018 guidewire advanced into the  gallbladder. Accustick dilator set advanced. Amplatz guidewire exchange performed. Tract dilatation performed to insert a 10 [place] drain. Drain catheter retention loop formed the gallbladder. Position confirmed with fluoroscopy and ultrasound. Images obtained for documentation. Syringe aspiration yielded 20 cc exudative bile. Sample sent for culture. Catheter secured with Prolene suture and connected to external gravity drainage bag. Sterile dressing applied. No immediate complication. Patient tolerated the procedure well. IMPRESSION: Successful ultrasound and fluoroscopic 10 French cholecystostomy placement. Electronically Signed   By: Jerilynn Mages.  Shick M.D.   On: 08/17/2020 17:47      Armandina Gemma 08/19/2020  Patient ID: Lauren Santana, female   DOB: 01/17/1951, 69 y.o.   MRN: [id number]

## 2020-08-20 LAB — CBC WITH DIFFERENTIAL/PLATELET
Abs Immature Granulocytes: 0.03 10*3/uL (ref 0.00–0.07)
Basophils Absolute: 0 10*3/uL (ref 0.0–0.1)
Basophils Relative: 1 %
Eosinophils Absolute: 0.3 10*3/uL (ref 0.0–0.5)
Eosinophils Relative: 5 %
HCT: 30.6 % — ABNORMAL LOW (ref 36.0–46.0)
Hemoglobin: 9.4 g/dL — ABNORMAL LOW (ref 12.0–15.0)
Immature Granulocytes: 1 %
Lymphocytes Relative: 24 %
Lymphs Abs: 1.3 10*3/uL (ref 0.7–4.0)
MCH: 30.6 pg (ref 26.0–34.0)
MCHC: 30.7 g/dL (ref 30.0–36.0)
MCV: 99.7 fL (ref 80.0–100.0)
Monocytes Absolute: 0.8 10*3/uL (ref 0.1–1.0)
Monocytes Relative: 14 %
Neutro Abs: 3.1 10*3/uL (ref 1.7–7.7)
Neutrophils Relative %: 55 %
Platelets: 158 10*3/uL (ref 150–400)
RBC: 3.07 MIL/uL — ABNORMAL LOW (ref 3.87–5.11)
RDW: 14.3 % (ref 11.5–15.5)
WBC: 5.5 10*3/uL (ref 4.0–10.5)
nRBC: 0 % (ref 0.0–0.2)

## 2020-08-20 LAB — COMPREHENSIVE METABOLIC PANEL
ALT: 10 U/L (ref 0–44)
AST: 9 U/L — ABNORMAL LOW (ref 15–41)
Albumin: 2.1 g/dL — ABNORMAL LOW (ref 3.5–5.0)
Alkaline Phosphatase: 56 U/L (ref 38–126)
Anion gap: 8 (ref 5–15)
BUN: <5 mg/dL — ABNORMAL LOW (ref 8–23)
CO2: 23 mmol/L (ref 22–32)
Calcium: 8.2 mg/dL — ABNORMAL LOW (ref 8.9–10.3)
Chloride: 105 mmol/L (ref 98–111)
Creatinine, Ser: 0.41 mg/dL — ABNORMAL LOW (ref 0.44–1.00)
GFR calc Af Amer: >60 mL/min (ref >60)
GFR calc non Af Amer: >60 mL/min (ref >60)
Glucose, Bld: 79 mg/dL (ref 70–99)
Potassium: 3.3 mmol/L — ABNORMAL LOW (ref 3.5–5.1)
Sodium: 136 mmol/L (ref 135–145)
Total Bilirubin: 0.6 mg/dL (ref 0.3–1.2)
Total Protein: 4.6 g/dL — ABNORMAL LOW (ref 6.5–8.1)

## 2020-08-20 LAB — BODY FLUID CULTURE: Special Requests: NORMAL

## 2020-08-20 LAB — SARS CORONAVIRUS 2 (TAT 6-24 HRS): SARS Coronavirus 2: NEGATIVE

## 2020-08-20 MED ORDER — PROCHLORPERAZINE MALEATE 10 MG PO TABS
5.0000 mg | ORAL_TABLET | Freq: Four times a day (QID) | ORAL | Status: DC | PRN
  Administered 2020-08-20 – 2020-08-25 (×8): 5 mg via ORAL
  Filled 2020-08-20 (×8): qty 1

## 2020-08-20 MED ORDER — AMOXICILLIN-POT CLAVULANATE 875-125 MG PO TABS
1.0000 | ORAL_TABLET | Freq: Two times a day (BID) | ORAL | Status: DC
  Administered 2020-08-20 – 2020-08-25 (×11): 1 via ORAL
  Filled 2020-08-20 (×11): qty 1

## 2020-08-20 NOTE — TOC Progression Note (Signed)
Transition of Care [facility]) - Progression Note    Patient Details  Name: GERALYN FIGIEL MRN: [id number] Date of Birth: [birth year]  Transition of [facility]) CM/SW Contact  Toneshia Coello, Juliann Pulse, RN Phone Number: 08/20/2020, 4:04 PM  Clinical Narrative:Noted PT recc HHPT.OT recc SNF-informed patient/son that insurance will not pay for SNF-they can pay OOP for it under [facility]. They also have medicaid, but has medicaid so if PT needs to re eval for LTC then I can pursue LTC @ Nursing home.MD updated.      Expected Discharge Plan: [place] Barriers to Discharge: Continued Medical Work up  Expected Discharge Plan and Services Expected Discharge Plan: [place]   Discharge Planning Services: CM Consult   Living arrangements for the past 2 months: Single Family Home                                       Social Determinants of Health (SDOH) Interventions    Readmission Risk Interventions Readmission Risk Prevention Plan 07/23/2020  Transportation Screening Complete  Medication Review (RN CM) Complete  Some recent data might be hidden

## 2020-08-20 NOTE — Care Management Important Message (Signed)
Important Message  Patient Details IM Letter presented to the Patient Name: Lauren Santana MRN: [id number] Date of Birth: 06-15-1951   Medicare Important Message Given:  Yes     [place], [place] 08/20/2020, 11:30 AM

## 2020-08-20 NOTE — Progress Notes (Addendum)
PROGRESS NOTE    Lauren Santana [facility]  TSV:[id number] DOB: 26-Mar-1951 DOA: 08/16/2020 PCP: Lucianne Lei, MD  Brief Narrative:   69 year old black female [place] (follows with Dr. Jolayne Haines) pulmonary embolism 2017 Rx 6 months Xarelto For history of endometriosis not amenable to surgical resection DM, HTN, HLD Hospitalized 8/26 through 9/1 large pulmonary embolism-started back on anticoagulation-developed A. fib RVR but was ultimately sent to skilled Returns to hospital 9/23 1 month nausea vomiting-in ED low-grade temp tachycardia tachyarrhythmia Lactic acid 3.7 gap 14 bilirubin 1.4 WBC 19 Abdominal ultrasound = acute calculus cholecystitis Rx IV cefepime Flagyl GI and general surgery consult by emergency room  Ultimately it was decided that patient was a very poor surgical candidate and that patient would need PERC drain placement Her drain was placed on 9/24 PM she had significant pain needing pain meds which has subsequently improved  She is stabilizing improved critically and has improved to a great degree    Assessment & Plan:   Principal Problem:   Cholecystitis, acute Active Problems:   Essential hypertension, benign   Pulmonary emboli (HCC)   Atrial fibrillation with RVR (HCC)   Anemia of chronic disease   Chronic pain   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HCC)   Abdominal pain   Severe sepsis (HCC)   Pressure injury of skin   1. Sepsis secondary to possible cholecystitis a. Received goal-directed fluid therapy-lactic acidosis resolved b. Saline lock LR 9/26 c. Percutaneous cholecystostomy placed 9/24 as poor surgical candidate-outpatient surgical options to be weighed once discharged d. Cultures growing possible Klebsiella was on cefepime Flagyl which will be transition to Augmentin 9/27 2. Cholecystotomy a. Pain control with Percocet 1 tab every 4 as needed moderate pain b. For severe pain use morphine 1 mg every 3 as needed 3. Recent  large saddle pulmonary embolism on heparin currently a. Post procedure have resumed Lovenox every 12 given active cancer b. Outpatient follow-up 4. Hypokalemia a. Resolved-saline lock 5. DLBLC followed at [facility] a. [facility] 6. DM TY 2 A1c 4.6 this admission a. Patient was n.p.o. - CBGs ranging 50s to 80s monitor trends alone if eating would stop aggressive checks 7. HTN a. History of no meds at present-monitor 8. HLD 9. Prior endometriosis a. Not amenable to surgery-Provera can worsen risk for VTE-defer this decision making to her OB/GYN b. Hold for now c. Outpatient follow-up  DVT prophylaxis: lovenox treatment doses resumed Code Status: Full Family Communication: Updated at bedside her son [name] [phone]  Disposition:   Status is: Inpatient  Remains inpatient appropriate because:Hemodynamically unstable, Persistent severe electrolyte disturbances, Altered mental status and Ongoing diagnostic testing needed not appropriate for outpatient work up   Dispo: The patient is from: Home              Anticipated d/c is to: SNF              Anticipated d/c date is: 2 days              Patient currently is not medically stable to d/c.   Consultants:   gen surgery  Procedures: [place] Percutaneous cholecystotomy under  IR 9/24  Antimicrobials: flagyl/cefepime   Subjective:  Doing much better overall improved no chest pain no fever   Objective: Vitals:   08/19/20 0515 08/19/20 1217 08/19/20 2025 08/20/20 0527  BP: 124/62 127/66 123/62 (!) 124/59  Pulse: 88 93 97 85  Resp: 20 17 18 18   Temp:  98.4 F (36.9 C) 98.8 F (37.1 C) 99.4 F (37.4 C) 99 F (37.2 C)  TempSrc:  Oral Oral   SpO2: 100% 100% 100% 95%  Weight:      Height:        Intake/Output Summary (Last 24 hours) at 08/20/2020 1243 Last data filed at 08/20/2020 1124 Gross per 24 hour  Intake 697.49 ml  Output 3250 ml  Net -2552.51 ml   Filed Weights   08/16/20 2138    Weight: 78.3 kg    Examination: Arousable no distress EOMI NCAT Chest clear Soft nontender abdomen no rebound no guarding  drain in place draining well S1-S2 no murmur rub or gallop No lower extremity edema  Data Reviewed: I have personally reviewed following labs and imaging studies Potassium 3.1-->3.8-->3.3 BUN/creatinine 10/0.4-->7/0.5-->5/0.4 WBC 14-->8.7 -->5.5 hEmoglobin 10-has been >9.7-->9.4 Platelet 121--> 158  Radiology Studies: No results found.   Scheduled Meds: . Chlorhexidine Gluconate Cloth  6 each Topical Daily  . enoxaparin (LOVENOX) injection  80 mg Subcutaneous BID  . sodium chloride flush  10-40 mL Intracatheter Q12H   Continuous Infusions: . ceFEPime (MAXIPIME) IV 2 g (08/20/20 0729)  . lactated ringers Stopped (08/16/20 2211)  . metronidazole 500 mg (08/20/20 0556)     LOS: 4 days    Time spent: 20  Nita Sells, MD [facility] To contact the attending provider between 7A-7P or the covering provider during after hours 7P-7A, please log into the web site [email or web address] and access using universal [place] password for that web site. If you do not have the password, please call the hospital operator.  08/20/2020, 12:43 PM

## 2020-08-20 NOTE — Evaluation (Signed)
Physical Therapy Evaluation Patient Details Name: Lauren Santana MRN: [id number] DOB: 12-Feb-1951 Today's Date: 08/20/2020   History of Present Illness  69 yo female admitted for Sepsis secondary to possible cholecystitis and s/p perc drain.  Hx of lymphoma, L TKA 2015, PE, T10/T11 fractures  Clinical Impression  Pt admitted with above diagnosis.  Pt currently with functional limitations due to the deficits listed below (see PT Problem List). Pt will benefit from skilled PT to increase their independence and safety with mobility to allow discharge to the venue listed below.  Pt assisted OOB and had BMs so transferred to recliner.  Pt lives at home with son and uses rollator to ambulate at baseline.     Follow Up Recommendations Home health PT;Supervision for mobility/OOB    Equipment Recommendations  None recommended by PT    Recommendations for Other Services       Precautions / Restrictions Precautions Precautions: Fall Precaution Comments: R perc drain      Mobility  Bed Mobility Overal bed mobility: Needs Assistance Bed Mobility: Supine to Sit     Supine to sit: Supervision;HOB elevated        Transfers Overall transfer level: Needs assistance Equipment used: Rolling walker (2 wheeled) Transfers: Sit to/from [facility] Sit to Stand: Mod assist Stand pivot transfers: Min assist       General transfer comment: assist to rise and steady, performed twice as pt had loose BM upon first stand, pt with small loose amounts continuous so only transferred to recliner  Ambulation/Gait             General Gait Details: deferred to due BM  Stairs            Wheelchair Mobility    Modified Rankin (Stroke Patients Only)       Balance Overall balance assessment: Needs assistance         Standing balance support: Bilateral upper extremity supported Standing balance-Leahy Scale: Poor Standing balance comment: reliant on UE support                              Pertinent Vitals/Pain Pain Assessment: No/denies pain    Home Living Family/patient expects to be discharged to:: Private residence Living Arrangements: Children Available Help at Discharge: Family;Available PRN/intermittently Type of Home: Apartment Home Access: Level entry     Home Layout: One level Home Equipment: Walker - 4 wheels;Cane - single point;Grab bars - tub/shower;Tub bench;Hand held shower head;Bedside commode      Prior Function Level of Independence: Needs assistance   Gait / Transfers Assistance Needed: using rollator for ambulation  ADL's / Homemaking Assistance Needed: assist with bathing, dressing        Hand Dominance        Extremity/Trunk Assessment        Lower Extremity Assessment Lower Extremity Assessment: Generalized weakness       Communication   Communication: No difficulties  Cognition Arousal/Alertness: Awake/alert Behavior During Therapy: WFL for tasks assessed/performed Overall Cognitive Status: Within Functional Limits for tasks assessed                                        General Comments      Exercises     Assessment/Plan    PT Assessment Patient needs continued PT services  PT Problem List Decreased  balance;Decreased activity tolerance;Decreased strength;Decreased mobility;Decreased knowledge of use of DME       PT Treatment Interventions DME instruction;Therapeutic exercise;Functional mobility training;Therapeutic activities;Patient/family education;Gait training;Balance training    PT Goals (Current goals can be found in the Care Plan section)  Acute Rehab PT Goals PT Goal Formulation: With patient Time For Goal Achievement: 09/03/20 Potential to Achieve Goals: Good    Frequency Min 3X/week   Barriers to discharge        Co-evaluation               AM-PAC PT "6 Clicks" Mobility  Outcome Measure Help needed turning from your back to your  side while in a flat bed without using bedrails?: A Little Help needed moving from lying on your back to sitting on the side of a flat bed without using bedrails?: A Little Help needed moving to and from a bed to a chair (including a wheelchair)?: A Lot Help needed standing up from a chair using your arms (e.g., wheelchair or bedside chair)?: A Lot Help needed to walk in hospital room?: A Little Help needed climbing 3-5 steps with a railing? : Total 6 Click Score: 14    End of Session Equipment Utilized During Treatment: Gait belt Activity Tolerance: Patient tolerated treatment well Patient left: with call bell/phone within reach;in chair;with chair alarm set Nurse Communication: Mobility status PT Visit Diagnosis: Difficulty in walking, not elsewhere classified (R26.2);Muscle weakness (generalized) (M62.81)    Time: 4982-6415 PT Time Calculation (min) (ACUTE ONLY): 17 min   Charges:   PT Evaluation $PT Eval Low Complexity: 1 Low        Kati PT, DPT Acute Rehabilitation Services Pager: [phone] Office: [phone]  [place] Ram E 08/20/2020, 1:24 PM

## 2020-08-20 NOTE — Progress Notes (Signed)
Referring Physician(s): Lauren Santana,D  Supervising Physician: Lauren Santana  Patient Status:  [name] - In-pt  Chief Complaint:  Abdominal pain, nausea/vomiting/cholecystitis  Subjective: Pt cont to have some intermittent N/V; denies worsening abdominal pain; son in room  Allergies: Tylenol [acetaminophen] and Sulfa antibiotics  Medications: Prior to Admission medications   Medication Sig Start Date End Date Taking? Authorizing Provider  atorvastatin (LIPITOR) 10 MG tablet TAKE ONE TABLET BY MOUTH  ONCE DAILY IN THE EVENING Patient taking differently: Take 10 mg by mouth every evening.  03/18/16  Yes Gildardo Cranker, DO  diclofenac Sodium (VOLTAREN) 1 % GEL Apply 2 g topically daily as needed (pain).  08/11/20  Yes [provider]  enoxaparin (LOVENOX) 80 MG/0.8ML injection Inject 0.8 mLs (80 mg total) into the skin every 12 (twelve) hours. 07/25/20  Yes Dwyane Dee, MD  gabapentin (NEURONTIN) 300 MG capsule Take 300 mg by mouth 3 (three) times daily.   Yes [provider]  medroxyPROGESTERone (PROVERA) 10 MG tablet Take 10 mg by mouth daily.  01/06/20  Yes [provider]  ondansetron (ZOFRAN) 4 MG tablet Take 1 tablet (4 mg total) by mouth 2 (two) times daily as needed for nausea. 07/25/20  Yes Dwyane Dee, MD  oxyCODONE-acetaminophen (PERCOCET) 7.5-325 MG tablet Take 1 tablet by mouth every 6 (six) hours as needed for severe pain. 07/25/20  Yes Dwyane Dee, MD  potassium chloride (KLOR-CON) 10 MEQ tablet Take 10 mEq by mouth 2 (two) times daily. 06/20/20  Yes [provider]  sucralfate (CARAFATE) 1 GM/10ML suspension Take 1 g by mouth 3 (three) times daily before meals. 01/06/20  Yes [provider]     Vital Signs: BP 121/66 (BP Location: Left Arm)   Pulse 88   Temp 98.5 F (36.9 C) (Oral)   Resp 18   Ht 5' 0.98" (1.549 m)   Wt 172 lb 9.9 oz (78.3 kg)   LMP  (LMP Unknown)   SpO2 99%   BMI 32.63 kg/m   Physical Exam awake,  alert.  Gallbladder drain intact, insertion site okay, mildly tender, output 200 cc yesterday, 50 cc today green bile, drain flushed without difficulty  Imaging: NM Hepatobiliary Liver Func  Result Date: 08/17/2020 CLINICAL DATA:  Abdominal pain. Ultrasound imaging suggesting cholecystitis. EXAM: NUCLEAR MEDICINE HEPATOBILIARY IMAGING TECHNIQUE: Sequential images of the abdomen were obtained out to 60 minutes following intravenous administration of radiopharmaceutical. RADIOPHARMACEUTICALS:  5.5 mCi Tc-51m  Choletec IV COMPARISON:  Ultrasound exam 08/16/2020 FINDINGS: Prompt uptake of radiotracer by the liver parenchyma is evident. Biliary activity is visible by 20 minutes with gut activity noted at 25-30 minutes. After 2 hours of observation, no gallbladder filling is observed. Patient was given 3 mg intravenous morphine and imaged for an additional 30 minutes. A booster dose could not be administered for further imaging due to national radiopharmaceutical shortage. After 30 minutes of observation post morphine, no gallbladder filling is observed. IMPRESSION: 1. Normal hepatobiliary patency without common bile duct obstruction. 2. No gallbladder filling before or after intravenous morphine administration. Imaging after morphine limited by inability to administer a booster dose of radiopharmaceutical, but imaging features likely reflect cystic duct obstruction. Electronically Signed   By: Lauren Santana M.D.   On: 08/17/2020 14:01   IR Perc Cholecystostomy  Result Date: 08/17/2020 INDICATION: Acute calculus cholecystitis EXAM: ULTRASOUND AND FLUOROSCOPIC PERCUTANEOUS TRANSHEPATIC CHOLECYSTOSTOMY MEDICATIONS: Patient is already receiving antibiotics as an inpatient.; The antibiotic was administered within an appropriate time frame prior to the initiation of  the procedure. ANESTHESIA/SEDATION: Moderate (conscious) sedation was employed during this procedure. A total of Versed 0.5 mg and Fentanyl 25 mcg was  administered intravenously. Moderate Sedation Time: 10 minutes. The patient's level of consciousness and vital signs were monitored continuously by radiology nursing throughout the procedure under my direct supervision. FLUOROSCOPY TIME:  Fluoroscopy Time: 0 minutes 48 seconds (10 mGy). COMPLICATIONS: None immediate. PROCEDURE: Informed written consent was obtained from the patient after a thorough discussion of the procedural risks, benefits and alternatives. All questions were addressed. Maximal Sterile Barrier Technique was utilized including caps, mask, sterile gowns, sterile gloves, sterile drape, hand hygiene and skin antiseptic. A timeout was performed prior to the initiation of the procedure. Previous imaging reviewed. Ultrasound was localized in the right upper quadrant. Transhepatic window marked. Under sterile conditions and local anesthesia, a 21 gauge needle was advanced from a right upper quadrant transhepatic approach into the gallbladder. Needle position confirmed with ultrasound. Images obtained for documentation. There was return of exudative bile. 018 guidewire advanced into the gallbladder. Accustick dilator set advanced. Amplatz guidewire exchange performed. Tract dilatation performed to insert a 10 [place] drain. Drain catheter retention loop formed the gallbladder. Position confirmed with fluoroscopy and ultrasound. Images obtained for documentation. Syringe aspiration yielded 20 cc exudative bile. Sample sent for culture. Catheter secured with Prolene suture and connected to external gravity drainage bag. Sterile dressing applied. No immediate complication. Patient tolerated the procedure well. IMPRESSION: Successful ultrasound and fluoroscopic 10 French cholecystostomy placement. Electronically Signed   By: Lauren Santana.  Lauren Santana M.D.   On: 08/17/2020 17:47   DG Chest Port 1 View  Result Date: 08/16/2020 CLINICAL DATA:  Questionable sepsis - evaluate for abnormality Abdominal pain. EXAM: PORTABLE  CHEST 1 VIEW COMPARISON:  Chest radiograph 07/21/2020.  CT 07/19/2020 FINDINGS: Lung volumes are low. Accessed right chest port with tip in the right atrium, unchanged. Similar cardiomegaly. Unchanged mediastinal contours allowing for rotation. Minor subsegmental atelectasis at the lung bases. No confluent airspace disease. No pneumothorax or large pleural effusion. Degenerative change of both shoulders. IMPRESSION: 1. Low lung volumes with mild bibasilar atelectasis. 2. Unchanged cardiomegaly. Electronically Signed   By: Keith Rake M.D.   On: 08/16/2020 18:03   US Abdomen Limited RUQ  Result Date: 08/16/2020 CLINICAL DATA:  Right upper quadrant abdominal pain EXAM: ULTRASOUND ABDOMEN LIMITED RIGHT UPPER QUADRANT COMPARISON:  CT dated May 22, 2020 FINDINGS: Gallbladder: There is cholelithiasis. The gallbladder wall is thickened measuring approximately 5 mm. There is pericholecystic free fluid. The sonographic Percell Miller sign is reported as negative. Common bile duct: Diameter: 5 mm Liver: No focal lesion identified. Within normal limits in parenchymal echogenicity. Portal vein is patent on color Doppler imaging with normal direction of blood flow towards the liver. Other: None. IMPRESSION: Findings are equivocal for acute calculus cholecystitis. While there is gallbladder wall thickening in the presence of gallstones and pericholecystic free fluid, the sonographic Percell Miller sign is reported as negative. Surgical consultation is recommended. Consider HIDA scan for further evaluation. Electronically Signed   By: Constance Holster M.D.   On: 08/16/2020 17:12    Labs:  CBC: Recent Labs    08/17/20 0051 08/18/20 0905 08/19/20 0436 08/20/20 0244  WBC 14.3* 8.7 5.9 5.5  HGB 10.2* 9.7* 9.5* 9.4*  HCT 32.3* 31.3* 29.9* 30.6*  PLT 121* 131* 151 158    COAGS: Recent Labs    08/16/20 2207 08/17/20 0051  INR 1.3* 1.3*  APTT 29  --     BMP: Recent Labs  08/17/20 0051 08/18/20 0905  08/19/20 0436 08/20/20 0244  NA 137 136 135 136  K 3.1* 3.8 3.5 3.3*  CL 107 108 106 105  CO2 23 19* 23 23  GLUCOSE 85 52* 89 79  BUN 10 7* 5* <5*  CALCIUM 8.2* 8.2* 8.3* 8.2*  CREATININE 0.43* 0.50 0.40* 0.41*  GFRNONAA >60 >60 >60 >60  GFRAA >60 >60 >60 >60    LIVER FUNCTION TESTS: Recent Labs    08/17/20 0051 08/18/20 0905 08/19/20 0436 08/20/20 0244  BILITOT 1.0 1.2 0.6 0.6  AST 12* 13* 12* 9*  ALT 10 11 10 10   ALKPHOS 60 61 64 56  PROT 4.7* 4.8* 4.8* 4.6*  ALBUMIN 2.3* 2.1* 2.1* 2.1*    Assessment and Plan: Patient with history of large B-cell lymphoma,PE and acute calculus cholecystitis/poor surgical candidate; status post percutaneous cholecystostomy on 9/24; temp 99, WBC normal, hemoglobin 9.4, creatinine 0.41, potassium 3.3, total bilirubin normal, bile culture with rare Klebsiella; continue drain irrigation, every 8 hours as an inpatient, once daily with 5 cc as outpatient along with output monitoring and dressing changes every 1 to 2 days; gallbladder drain will need to remain in place at least 4- 6 weeks unless surgery done in the interim; will schedule patient for follow-up cholangiogram in 4 to 6 weeks.   Electronically Signed: D. Rowe Robert, PA-C 08/20/2020, 1:25 PM   I spent a total of 15 minutes at the the patient's bedside AND on the patient's hospital floor or unit, greater than 50% of which was counseling/coordinating care for gallbladder drain    Patient ID: Lauren Santana, female   DOB: 22-Dec-1950, 69 y.o.   MRN: [id number]

## 2020-08-20 NOTE — Progress Notes (Signed)
Subjective: No new complaints.  Denies abdominal pain.  Ate some breakfast, but not a ton.  Says this is kind of her norm.  No other complaints.  ROS: See above, otherwise other systems negative  Objective: Vital signs in last 24 hours: Temp:  [98.8 F (37.1 C)-99.4 F (37.4 C)] 99 F (37.2 C) (09/27 0527) Pulse Rate:  [85-97] 85 (09/27 0527) Resp:  [17-18] 18 (09/27 0527) BP: (123-127)/(59-66) 124/59 (09/27 0527) SpO2:  [95 %-100 %] 95 % (09/27 0527) Last BM Date: 08/19/20  Intake/Output from previous day: 09/26 0701 - 09/27 0700 In: 1084.2 [P.O.:340; I.V.:266.7; IV Piggyback:477.5] Out: 3400 [Urine:3200; Drains:200] Intake/Output this shift: No intake/output data recorded.  PE: Gen: NAD, looks comfortable Abd: soft, NT, ND, +BS, perc chole drain in place with bilious output  Lab Results:  Recent Labs    08/19/20 0436 08/20/20 0244  WBC 5.9 5.5  HGB 9.5* 9.4*  HCT 29.9* 30.6*  PLT 151 158   BMET Recent Labs    08/19/20 0436 08/20/20 0244  NA 135 136  K 3.5 3.3*  CL 106 105  CO2 23 23  GLUCOSE 89 79  BUN 5* <5*  CREATININE 0.40* 0.41*  CALCIUM 8.3* 8.2*   PT/INR No results for input(s): LABPROT, INR in the last 72 hours. CMP     Component Value Date/Time   NA 136 08/20/2020 0244   NA 142 01/03/2019 1352   K 3.3 (L) 08/20/2020 0244   CL 105 08/20/2020 0244   CO2 23 08/20/2020 0244   GLUCOSE 79 08/20/2020 0244   BUN <5 (L) 08/20/2020 0244   BUN 8 01/03/2019 1352   CREATININE 0.41 (L) 08/20/2020 0244   CALCIUM 8.2 (L) 08/20/2020 0244   PROT 4.6 (L) 08/20/2020 0244   ALBUMIN 2.1 (L) 08/20/2020 0244   AST 9 (L) 08/20/2020 0244   ALT 10 08/20/2020 0244   ALKPHOS 56 08/20/2020 0244   BILITOT 0.6 08/20/2020 0244   GFRNONAA >60 08/20/2020 0244   GFRAA >60 08/20/2020 0244   Lipase     Component Value Date/Time   LIPASE 16 08/16/2020 1441       Studies/Results: No results found.  Anti-infectives: Anti-infectives (From admission,  onward)   Start     Dose/Rate Route Frequency Ordered Stop   08/17/20 0600  ceFEPIme (MAXIPIME) 2 g in sodium chloride 0.9 % 100 mL IVPB  Status:  Discontinued        2 g 200 mL/hr over 30 Minutes Intravenous Every 8 hours 08/16/20 1840 08/16/20 2002   08/16/20 2200  ceFEPIme (MAXIPIME) 2 g in sodium chloride 0.9 % 100 mL IVPB  Status:  Discontinued        2 g 200 mL/hr over 30 Minutes Intravenous Every 8 hours 08/16/20 2002 08/16/20 2005   08/16/20 2030  metroNIDAZOLE (FLAGYL) IVPB 500 mg        500 mg 100 mL/hr over 60 Minutes Intravenous Every 8 hours 08/16/20 2001     08/16/20 2015  ceFEPIme (MAXIPIME) 2 g in sodium chloride 0.9 % 100 mL IVPB        2 g 200 mL/hr over 30 Minutes Intravenous Every 8 hours 08/16/20 2002     08/16/20 1815  ceFEPIme (MAXIPIME) 2 g in sodium chloride 0.9 % 100 mL IVPB  Status:  Discontinued        2 g 200 mL/hr over 30 Minutes Intravenous  Once 08/16/20 1811 08/16/20 2001   08/16/20 1815  metroNIDAZOLE (FLAGYL) IVPB 500 mg  Status:  Discontinued        500 mg 100 mL/hr over 60 Minutes Intravenous  Once 08/16/20 1811 08/16/20 2001       Assessment/Plan PE in August 2021 A fib Diffuse large B cell lymphoma - last chemo about 6 weeks ago  Cholecystitis -s/p perc chole drain placement on Saturday -much improvement.  WBC normal, LFTs normal -on a solid diet -drain in place and doing well -surgically stable for DC when medically stable. -would treat a total of 7-10 days of abx therapy -will need follow up in drain clinic and will have her follow up with Dr. Lucia Gaskins as well  FEN - HH diet VTE - lovenox 80mg  BID ID - cefepime, can convert to oral from our standpoint   LOS: 4 days    Henreitta Cea , [facility] 08/20/2020, 10:16 AM Please see Amion for pager number during day hours 7:00am-4:30pm or 7:00am -11:30am on weekends

## 2020-08-20 NOTE — Plan of Care (Signed)
  Problem: Elimination: Goal: Will not experience complications related to urinary retention Outcome: Progressing   Problem: Pain Managment: Goal: General experience of comfort will improve Outcome: Progressing   

## 2020-08-20 NOTE — Evaluation (Addendum)
Occupational Therapy Evaluation Patient Details Name: Lauren Santana MRN: [id number] DOB: 1951/03/25 Today's Date: 08/20/2020    History of Present Illness 69 yo female admitted for Sepsis secondary to possible cholecystitis and s/p perc drain.  Hx of lymphoma, L TKA 2015, PE, T10/T11 fractures   Clinical Impression   Pt admitted with cholecystitis. Pt currently with functional limitations due to the deficits listed below (see OT Problem List).  Pt will benefit from skilled OT to increase their safety and independence with ADL and functional mobility for ADL to facilitate discharge to venue listed below.   Per OT eval pt will need A with ADL activity.  Son verbalized need for rehab.     Follow Up Recommendations  SNF;Supervision/Assistance - 24 hour    Equipment Recommendations  None recommended by OT    Recommendations for Other Services       Precautions / Restrictions Precautions Precautions: Fall Precaution Comments: R perc drain      Mobility Bed Mobility Overal bed mobility: Needs Assistance Bed Mobility: Supine to Sit;Sit to Supine     Supine to sit: HOB elevated;Min assist Sit to supine: Min assist      Transfers Overall transfer level: Needs assistance Equipment used: Rolling walker (2 wheeled) Transfers: Sit to/from Stand Sit to Stand: Mod assist Stand pivot transfers: Min assist          Balance Overall balance assessment: Needs assistance         Standing balance support: Bilateral upper extremity supported Standing balance-Leahy Scale: Poor Standing balance comment: reliant on UE support                           ADL either performed or assessed with clinical judgement   ADL Overall ADL's : Needs assistance/impaired     Grooming: Wash/dry face;Sitting;Set up   Upper Body Bathing: Set up;Sitting   Lower Body Bathing: Maximal assistance;Sit to/from stand;Sitting/lateral leans;Cueing for sequencing;Cueing for safety    Upper Body Dressing : Set up;Sitting   Lower Body Dressing: Maximal assistance;Sit to/from stand;Sitting/lateral leans;Cueing for safety;Cueing for sequencing      Toileting- Clothing Manipulation and Hygiene: +2 for physical assistance;+2 for safety/equipment;Maximal assistance;Sit to/from stand         General ADL Comments: son present for eval. pt will ikely need need SNF     Vision Patient Visual Report: No change from baseline       Perception     Praxis      Pertinent Vitals/Pain Pain Assessment: No/denies pain     Hand Dominance     Extremity/Trunk Assessment Upper Extremity Assessment Upper Extremity Assessment: Generalized weakness   Lower Extremity Assessment Lower Extremity Assessment: Generalized weakness       Communication Communication Communication: No difficulties   Cognition Arousal/Alertness: Awake/alert Behavior During Therapy: WFL for tasks assessed/performed Overall Cognitive Status: Within Functional Limits for tasks assessed                                                Home Living Family/patient expects to be discharged to:: Private residence Living Arrangements: Children Available Help at Discharge: Family;Available PRN/intermittently Type of Home: Apartment Home Access: Level entry     Home Layout: One level     Bathroom Shower/Tub: Teacher, early years/pre: Standard  Home Equipment: Allison - 4 wheels;Cane - single point;Grab bars - tub/shower;Tub bench;Hand held shower head;Bedside commode          Prior Functioning/Environment Level of Independence: Needs assistance  Gait / Transfers Assistance Needed: using rollator for ambulation ADL's / Homemaking Assistance Needed: assist with bathing, dressing            OT Problem List: Decreased strength;Decreased activity tolerance;Impaired balance (sitting and/or standing);Decreased safety awareness;Obesity      OT  Treatment/Interventions: Self-care/ADL training;Patient/family education;Therapeutic activities    OT Goals(Current goals can be found in the care plan section) Acute Rehab OT Goals Patient Stated Goal: go home and not rehab OT Goal Formulation: With patient Time For Goal Achievement: 09/03/20 Potential to Achieve Goals: Good  OT Frequency: Min 2X/week   Barriers to D/C: Decreased caregiver support             AM-PAC OT "6 Clicks" Daily Activity     Outcome Measure Help from another person eating meals?: None Help from another person taking care of personal grooming?: A Little Help from another person toileting, which includes using toliet, bedpan, or urinal?: A Lot Help from another person bathing (including washing, rinsing, drying)?: A Lot Help from another person to put on and taking off regular upper body clothing?: A Little Help from another person to put on and taking off regular lower body clothing?: A Lot 6 Click Score: 16   End of Session Nurse Communication: Mobility status  Activity Tolerance: Patient limited by fatigue Patient left: in bed;with call bell/phone within reach;with family/visitor present  OT Visit Diagnosis: Unsteadiness on feet (R26.81);Other abnormalities of gait and mobility (R26.89);Muscle weakness (generalized) (M62.81)                Time: 3005-1102 OT Time Calculation (min): 23 min Charges:  OT General Charges $OT Visit: 1 Visit OT Evaluation $OT Eval Moderate Complexity: 1 Mod  [name], [place] [phone] Office- [phone]     [name], [name] 08/20/2020, 2:45 PM

## 2020-08-21 ENCOUNTER — Telehealth: Payer: Self-pay | Admitting: Orthotics

## 2020-08-21 LAB — COMPREHENSIVE METABOLIC PANEL
ALT: 14 U/L (ref 0–44)
AST: 30 U/L (ref 15–41)
Albumin: 2.2 g/dL — ABNORMAL LOW (ref 3.5–5.0)
Alkaline Phosphatase: 65 U/L (ref 38–126)
Anion gap: 6 (ref 5–15)
BUN: <5 mg/dL — ABNORMAL LOW (ref 8–23)
CO2: 25 mmol/L (ref 22–32)
Calcium: 8.4 mg/dL — ABNORMAL LOW (ref 8.9–10.3)
Chloride: 104 mmol/L (ref 98–111)
Creatinine, Ser: 0.35 mg/dL — ABNORMAL LOW (ref 0.44–1.00)
GFR calc Af Amer: >60 mL/min (ref >60)
GFR calc non Af Amer: >60 mL/min (ref >60)
Glucose, Bld: 78 mg/dL (ref 70–99)
Potassium: 3.3 mmol/L — ABNORMAL LOW (ref 3.5–5.1)
Sodium: 135 mmol/L (ref 135–145)
Total Bilirubin: 0.5 mg/dL (ref 0.3–1.2)
Total Protein: 4.9 g/dL — ABNORMAL LOW (ref 6.5–8.1)

## 2020-08-21 LAB — CBC WITH DIFFERENTIAL/PLATELET
Abs Immature Granulocytes: 0.06 10*3/uL (ref 0.00–0.07)
Basophils Absolute: 0 10*3/uL (ref 0.0–0.1)
Basophils Relative: 1 %
Eosinophils Absolute: 0.2 10*3/uL (ref 0.0–0.5)
Eosinophils Relative: 4 %
HCT: 31.9 % — ABNORMAL LOW (ref 36.0–46.0)
Hemoglobin: 10 g/dL — ABNORMAL LOW (ref 12.0–15.0)
Immature Granulocytes: 1 %
Lymphocytes Relative: 27 %
Lymphs Abs: 1.4 10*3/uL (ref 0.7–4.0)
MCH: 30.7 pg (ref 26.0–34.0)
MCHC: 31.3 g/dL (ref 30.0–36.0)
MCV: 97.9 fL (ref 80.0–100.0)
Monocytes Absolute: 0.8 10*3/uL (ref 0.1–1.0)
Monocytes Relative: 15 %
Neutro Abs: 2.7 10*3/uL (ref 1.7–7.7)
Neutrophils Relative %: 52 %
Platelets: 193 10*3/uL (ref 150–400)
RBC: 3.26 MIL/uL — ABNORMAL LOW (ref 3.87–5.11)
RDW: 14.3 % (ref 11.5–15.5)
WBC: 5.2 10*3/uL (ref 4.0–10.5)
nRBC: 0 % (ref 0.0–0.2)

## 2020-08-21 MED ORDER — POTASSIUM CHLORIDE CRYS ER 20 MEQ PO TBCR: 40.0000 meq | EXTENDED_RELEASE_TABLET | Freq: Every day | ORAL | Status: DC

## 2020-08-21 MED ORDER — AMOXICILLIN-POT CLAVULANATE 875-125 MG PO TABS: 1.0000 | ORAL_TABLET | Freq: Two times a day (BID) | ORAL | 0 refills | Status: DC

## 2020-08-21 MED ORDER — ALUM & MAG HYDROXIDE-SIMETH 200-200-20 MG/5ML PO SUSP
15.0000 mL | Freq: Once | ORAL | Status: AC
  Administered 2020-08-21: 15 mL via ORAL
  Filled 2020-08-21: qty 30

## 2020-08-21 MED ORDER — GABAPENTIN 300 MG PO CAPS
300.0000 mg | ORAL_CAPSULE | Freq: Three times a day (TID) | ORAL | 0 refills | Status: DC
Start: 2020-08-21 — End: 2023-03-10

## 2020-08-21 MED ORDER — POTASSIUM CHLORIDE CRYS ER 20 MEQ PO TBCR
40.0000 meq | EXTENDED_RELEASE_TABLET | Freq: Every day | ORAL | Status: DC
  Administered 2020-08-21 – 2020-08-25 (×5): 40 meq via ORAL
  Filled 2020-08-21 (×5): qty 2

## 2020-08-21 MED ORDER — PROCHLORPERAZINE MALEATE 5 MG PO TABS: 5.0000 mg | ORAL_TABLET | Freq: Four times a day (QID) | ORAL | 0 refills | Status: DC | PRN

## 2020-08-21 MED ORDER — OXYCODONE-ACETAMINOPHEN 7.5-325 MG PO TABS
1.0000 | ORAL_TABLET | Freq: Four times a day (QID) | ORAL | 0 refills | Status: DC | PRN
Start: 2020-08-21 — End: 2020-08-23

## 2020-08-21 NOTE — TOC Progression Note (Addendum)
Transition of Care [facility]) - Progression Note    Patient Details  Name: IMOJEAN YOSHINO MRN: [id number] Date of Birth: 01/09/1951  [facility]) CM/SW Contact  Loistine Eberlin, Juliann Pulse, RN Phone Number: 08/21/2020, 11:06 AM  Clinical Narrative: Noted per nsg-2+ asst. PT to re eval-await recc.Noted PT recc SNF-patient not medically stable-will start auth tomorrow since has pending medical w/u. Will do fl2-fax out. Await bed offers.    Expected Discharge Plan: [place] Barriers to Discharge: Continued Medical Work up  Expected Discharge Plan and Services Expected Discharge Plan: [place]   Discharge Planning Services: CM Consult   Living arrangements for the past 2 months: Single Family Home                                       Social Determinants of Health (SDOH) Interventions    Readmission Risk Interventions Readmission Risk Prevention Plan 07/23/2020  Transportation Screening Complete  Medication Review (RN CM) Complete  Some recent data might be hidden

## 2020-08-21 NOTE — Telephone Encounter (Signed)
Left message with patient that she doesn't qualify for medicare diabetic shoes since she isn't diabetic.  We can order her the same she has had before with a pair of inserts. Self pay $250.

## 2020-08-21 NOTE — NC FL2 (Signed)
[place] LEVEL OF CARE SCREENING TOOL     IDENTIFICATION  Patient Name: Lauren Santana Birthdate: [birth year] Sex: female Admission Date (Current Location): 08/16/2020  [facility] [place] Number:  Herbalist and Address:  [facility],  [place] [address] [place]      Provider Number: [id number]  Attending Physician Name and Address:  Nita Sells, MD  Relative Name and Phone Number:  Zonie Crutcher [phone]    Current Level of Care: Hospital Recommended Level of Care: [place] Prior Approval Number:    Date Approved/Denied:   PASRR Number: [id number]  Discharge Plan: SNF    Current Diagnoses: Patient Active Problem List   Diagnosis Date Noted  . Cholecystitis, acute 08/16/2020  . Abdominal pain 08/16/2020  . Severe sepsis ([place]) 08/16/2020  . Pressure injury of skin 08/16/2020  . Atrial fibrillation with RVR ([place]) 07/24/2020  . Thrombocytopenia ([place]) 07/24/2020  . Anemia of chronic disease 07/24/2020  . Chronic pain 07/24/2020  . 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 Pulmonary embolism ([place]) 07/19/2020  . 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 S/P BSO (bilateral salpingo-oophorectomy) 01/08/2017  . S/P hysterectomy 01/08/2017  . Tachycardia   . Acute deep vein thrombosis (DVT) of right lower extremity ([place]) 02/14/2016  . Pulmonary emboli ([place]) 02/13/2016  . PE (pulmonary embolism) 02/13/2016  . AKI (acute kidney injury) ([place]) 02/13/2016  . Pulmonary embolism with acute cor pulmonale ([place])   . Pelvic mass in female   . Obesity (BMI 30-39.9) 08/20/2015  . 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 Essential hypertension, benign 07/23/2014  . Edema 07/23/2014  . Dyslipidemia 07/23/2014  . Lower extremity numbness 07/23/2014  . 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 Postmenopausal bleeding 03/10/2012  . Endometriosis of pelvis 01/22/2007     Orientation RESPIRATION BLADDER Height & Weight     Self, Time, Situation  Normal Continent Weight: 78.3 kg Height:  5' 0.98" (154.9 cm)  BEHAVIORAL SYMPTOMS/MOOD NEUROLOGICAL BOWEL NUTRITION STATUS      Continent (Biliary Drain) Diet (Heart Healthy)  AMBULATORY STATUS COMMUNICATION OF NEEDS Skin   Limited Assist Verbally Surgical wounds (R Percutaneous cholecystostomy Drain)                       Personal Care Assistance Level of Assistance  Bathing, Dressing, Feeding Bathing Assistance: Limited assistance Feeding assistance: Limited assistance Dressing Assistance: Limited assistance     Functional Limitations Info  Sight, Speech, Hearing Sight Info: Impaired (eyeglasses) Hearing Info: Adequate Speech Info: Adequate    SPECIAL CARE FACTORS FREQUENCY  PT (By licensed PT), OT (By licensed OT)     PT Frequency: 5x week OT Frequency: 5x week            Contractures Contractures Info: Not present    Additional Factors Info  Code Status, Allergies Code Status Info: Full code Allergies Info:  (Tylenol/sulfa antibiotics.)           Current Medications (08/21/2020):  This is the current hospital active medication list Current Facility-Administered Medications  Medication Dose Route Frequency Provider Last Rate Last Admin  . amoxicillin-clavulanate (AUGMENTIN) 875-125 MG per tablet 1 tablet  1 tablet Oral Q12H Nita Sells, MD   1 tablet at 08/21/20 0833  . bisacodyl (DULCOLAX) EC tablet 5 mg  5 mg Oral Daily PRN Swayze, Ava, DO      . Chlorhexidine Gluconate Cloth 2 %  PADS 6 each  6 each Topical Daily Swayze, Ava, DO   6 each at 08/20/20 1131  . enoxaparin (LOVENOX) injection 80 mg  80 mg Subcutaneous BID Lovey Newcomer T, NP   80 mg at 08/21/20 0835  . lactated ringers bolus 500 mL  500 mL Intravenous Once Swayze, Ava, DO   Paused at 08/16/20 2211  . morphine 2 MG/ML injection 1 mg  1 mg Intravenous Q3H PRN Nita Sells, MD      . ondansetron  (ZOFRAN) tablet 4 mg  4 mg Oral Q6H PRN Swayze, Ava, DO       Or  . ondansetron (ZOFRAN) injection 4 mg  4 mg Intravenous Q6H PRN Swayze, Ava, DO   4 mg at 08/20/20 1124  . oxyCODONE-acetaminophen (PERCOCET/ROXICET) 5-325 MG per tablet 1 tablet  1 tablet Oral Q4H PRN Nita Sells, MD   1 tablet at 08/20/20 2053  . potassium chloride SA (KLOR-CON) CR tablet 40 mEq  40 mEq Oral Daily Nita Sells, MD   40 mEq at 08/21/20 0833  . prochlorperazine (COMPAZINE) tablet 5 mg  5 mg Oral Q6H PRN Nita Sells, MD   5 mg at 08/21/20 0834  . sodium chloride flush (NS) 0.9 % injection 10-40 mL  10-40 mL Intracatheter Q12H Swayze, Ava, DO   10 mL at 08/17/20 2116  . sodium chloride flush (NS) 0.9 % injection 10-40 mL  10-40 mL Intracatheter PRN Swayze, Ava, DO         Discharge Medications: Please see discharge summary for a list of discharge medications.  Relevant Imaging Results:  Relevant Lab Results:   Additional Information SSN: [id number]  Nitika Jackowski, Juliann Pulse, RN

## 2020-08-21 NOTE — Discharge Summary (Signed)
Physician Discharge Summary  [facility] GHW:[id number] DOB: 08/19/1951 DOA: 08/16/2020  PCP: Lucianne Lei, MD  Admit date: 08/16/2020 Discharge date: 08/21/2020  Time spent: 35 minutes  Recommendations for Outpatient Follow-up:  1. Needs at least 14 days of antibiotics and leg 05/13/2020 given cholecystotomy drain placed and this will need to be followed up with IR in the outpatient setting 2. Recommend CBC Chem-12 in the outpatient setting 3. Discharging to skilled facility when able to be discharged 4. Multiple meds changed on MAR from this admission-see list below 5. May benefit from interval cholecystectomy as per general surgery-to be coordinated by skilled facility  Discharge Diagnoses:  Principal Problem:   Cholecystitis, acute Active Problems:   Essential hypertension, benign   Pulmonary emboli (HCC)   Atrial fibrillation with RVR (HCC)   Anemia of chronic disease   Chronic pain   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HCC)   Abdominal pain   Severe sepsis (HCC)   Pressure injury of skin   Discharge Condition: Improved  Diet recommendation: Regular  Filed Weights   08/16/20 2138  Weight: 78.3 kg    History of present illness:  69 year old black female [facility] [place] (follows with Dr. Jolayne Haines) pulmonary embolism 2017 Rx 6 months Xarelto For history of endometriosis not amenable to surgical resection DM, HTN, HLD Hospitalized 8/26 through 9/1 large pulmonary embolism-started back on anticoagulation-developed A. fib RVR but was ultimately sent to skilled Returns to hospital 9/23 1 month nausea vomiting-in ED low-grade temp tachycardia tachyarrhythmia Lactic acid 3.7 gap 14 bilirubin 1.4 WBC 19 Abdominal ultrasound = acute calculus cholecystitis Rx IV cefepime Flagyl GI and general surgery consult by emergency room  Ultimately it was decided that patient was a very poor surgical candidate and that patient would need PERC drain placement Her  drain was placed on 9/24 PM she had significant pain needing pain meds which has subsequently improved  She is stabilizing improved critically and has improved to a great degree  Hospital Course:  1. Sepsis secondary to possible cholecystitis a. Received goal-directed fluid therapy-lactic acidosis resolved b. Saline lock LR 9/26 c. Percutaneous cholecystostomy placed 9/24 as poor surgical candidate-outpatient surgical options to be weighed once discharged d. Cultures growing possible Klebsiella was on cefepime Flagyl which will be transition to Augmentin 9/27-end date would be at least 14 days 10/6 may be extended to 3 weeks if significant drainage still noted 2. Cholecystotomy a. Pain control with Percocet 1 tab every 4 as needed moderate pain b. Pain is not severe does not need IV meds c. Does require IR follow-up of the drain within 6 weeks and or cholecystectomy by general surgery to be coordinated in consult with in the outpatient setting 3. Recent large saddle pulmonary embolism on heparin currently a. Post procedure have resumed Lovenox every 12 given active cancer b. Outpatient follow-up with oncology 4. Hypokalemia a. Resolved-saline lock 5. DLBLC followed at [facility] a. Outpatient follow-up with [facility] 6. DM TY 2 A1c 4.6 this admission a. Patient was n.p.o. - CBGs ranging 50s to 80s monitor trends-no aggressive checks at this juncture 7. HTN a. History of no meds at present-monitor 8. HLD 9. Prior endometriosis a. Not amenable to surgery-Provera can worsen risk for VTE-defer this decision making to her OB/GYN-I have discontinued on discharge b. Hold for now c.   Procedures:  Cholecystotomy by IR 08/17/2020  Consultations:  General surgery  IR  Discharge Exam: Vitals:   08/21/20 0501 08/21/20 1316  BP:  128/60 118/61  Pulse: 92 96  Resp: 18 18  Temp: 98.5 F (36.9 C) 98.8 F (37.1 C)  SpO2: 98% 100%    General: Awake alert coherent no distress  eating and drinking better pain seems relatively controlled Cardiovascular: S1-S2 no murmur rub or gallop Respiratory: Clear Abdomen soft cholecystotomy drain in place draining bilious material no tenderness around drain Lower extremities are soft no tenderness no edema ROM to lower extremities is intact  Discharge Instructions    Allergies as of 08/21/2020      Reactions   Tylenol [acetaminophen] Other (See Comments)   States "seems like eats my stomach"   Sulfa Antibiotics Other (See Comments)   Unknown allergic reaction      Medication List    STOP taking these medications   atorvastatin 10 MG tablet Commonly known as: LIPITOR   medroxyPROGESTERone 10 MG tablet Commonly known as: PROVERA   potassium chloride 10 MEQ tablet Commonly known as: KLOR-CON   sucralfate 1 GM/10ML suspension Commonly known as: CARAFATE     TAKE these medications   amoxicillin-clavulanate 875-125 MG tablet Commonly known as: AUGMENTIN Take 1 tablet by mouth every 12 (twelve) hours for 8 days.   diclofenac Sodium 1 % Gel Commonly known as: VOLTAREN Apply 2 g topically daily as needed (pain).   enoxaparin 80 MG/0.8ML injection Commonly known as: LOVENOX Inject 0.8 mLs (80 mg total) into the skin every 12 (twelve) hours.   gabapentin 300 MG capsule Commonly known as: NEURONTIN Take 1 capsule (300 mg total) by mouth 3 (three) times daily.   ondansetron 4 MG tablet Commonly known as: ZOFRAN Take 1 tablet (4 mg total) by mouth 2 (two) times daily as needed for nausea.   oxyCODONE-acetaminophen 7.5-325 MG tablet Commonly known as: PERCOCET Take 1 tablet by mouth every 6 (six) hours as needed for severe pain.   potassium chloride SA 20 MEQ tablet Commonly known as: KLOR-CON Take 2 tablets (40 mEq total) by mouth daily. Start taking on: August 22, 2020   prochlorperazine 5 MG tablet Commonly known as: COMPAZINE Take 1 tablet (5 mg total) by mouth every 6 (six) hours as needed for  nausea or vomiting.      Allergies  Allergen Reactions  . Tylenol [Acetaminophen] Other (See Comments)    States "seems like eats my stomach"  . Sulfa Antibiotics Other (See Comments)    Unknown allergic reaction    Follow-up Information    Alphonsa Overall, MD Follow up on 09/21/2020.   Specialty: General Surgery Why: 11:15am, arrive by 10:45am for paperwork and check in process Contact information: [place] [address] [phone]        Greggory Keen, MD Follow up in 4 week(s).   Specialties: Interventional Radiology, Radiology Why: their office should call you with appointment date and time Contact information: [place] [address] [phone]                The results of significant diagnostics from this hospitalization (including imaging, microbiology, ancillary and laboratory) are listed below for reference.    Significant Diagnostic Studies: NM Hepatobiliary Liver Func  Result Date: 08/17/2020 CLINICAL DATA:  Abdominal pain. Ultrasound imaging suggesting cholecystitis. EXAM: NUCLEAR MEDICINE HEPATOBILIARY IMAGING TECHNIQUE: Sequential images of the abdomen were obtained out to 60 minutes following intravenous administration of radiopharmaceutical. RADIOPHARMACEUTICALS:  5.5 mCi Tc-72m  Choletec IV COMPARISON:  Ultrasound exam 08/16/2020 FINDINGS: Prompt uptake of radiotracer by the liver  parenchyma is evident. Biliary activity is visible by 20 minutes with gut activity noted at 25-30 minutes. After 2 hours of observation, no gallbladder filling is observed. Patient was given 3 mg intravenous morphine and imaged for an additional 30 minutes. A booster dose could not be administered for further imaging due to national radiopharmaceutical shortage. After 30 minutes of observation post morphine, no gallbladder filling is observed. IMPRESSION: 1. Normal hepatobiliary patency without common bile duct obstruction. 2.  No gallbladder filling before or after intravenous morphine administration. Imaging after morphine limited by inability to administer a booster dose of radiopharmaceutical, but imaging features likely reflect cystic duct obstruction. Electronically Signed   By: Misty Stanley M.D.   On: 08/17/2020 14:01   IR Perc Cholecystostomy  Result Date: 08/17/2020 INDICATION: Acute calculus cholecystitis EXAM: ULTRASOUND AND FLUOROSCOPIC PERCUTANEOUS TRANSHEPATIC CHOLECYSTOSTOMY MEDICATIONS: Patient is already receiving antibiotics as an inpatient.; The antibiotic was administered within an appropriate time frame prior to the initiation of the procedure. ANESTHESIA/SEDATION: Moderate (conscious) sedation was employed during this procedure. A total of Versed 0.5 mg and Fentanyl 25 mcg was administered intravenously. Moderate Sedation Time: 10 minutes. The patient's level of consciousness and vital signs were monitored continuously by radiology nursing throughout the procedure under my direct supervision. FLUOROSCOPY TIME:  Fluoroscopy Time: 0 minutes 48 seconds (10 mGy). COMPLICATIONS: None immediate. PROCEDURE: Informed written consent was obtained from the patient after a thorough discussion of the procedural risks, benefits and alternatives. All questions were addressed. Maximal Sterile Barrier Technique was utilized including caps, mask, sterile gowns, sterile gloves, sterile drape, hand hygiene and skin antiseptic. A timeout was performed prior to the initiation of the procedure. Previous imaging reviewed. Ultrasound was localized in the right upper quadrant. Transhepatic window marked. Under sterile conditions and local anesthesia, a 21 gauge needle was advanced from a right upper quadrant transhepatic approach into the gallbladder. Needle position confirmed with ultrasound. Images obtained for documentation. There was return of exudative bile. 018 guidewire advanced into the gallbladder. Accustick dilator set  advanced. Amplatz guidewire exchange performed. Tract dilatation performed to insert a 10 [place] drain. Drain catheter retention loop formed the gallbladder. Position confirmed with fluoroscopy and ultrasound. Images obtained for documentation. Syringe aspiration yielded 20 cc exudative bile. Sample sent for culture. Catheter secured with Prolene suture and connected to external gravity drainage bag. Sterile dressing applied. No immediate complication. Patient tolerated the procedure well. IMPRESSION: Successful ultrasound and fluoroscopic 10 French cholecystostomy placement. Electronically Signed   By: Jerilynn Mages.  Shick M.D.   On: 08/17/2020 17:47   DG Chest Port 1 View  Result Date: 08/16/2020 CLINICAL DATA:  Questionable sepsis - evaluate for abnormality Abdominal pain. EXAM: PORTABLE CHEST 1 VIEW COMPARISON:  Chest radiograph 07/21/2020.  CT 07/19/2020 FINDINGS: Lung volumes are low. Accessed right chest port with tip in the right atrium, unchanged. Similar cardiomegaly. Unchanged mediastinal contours allowing for rotation. Minor subsegmental atelectasis at the lung bases. No confluent airspace disease. No pneumothorax or large pleural effusion. Degenerative change of both shoulders. IMPRESSION: 1. Low lung volumes with mild bibasilar atelectasis. 2. Unchanged cardiomegaly. Electronically Signed   By: Keith Rake M.D.   On: 08/16/2020 18:03   US Abdomen Limited RUQ  Result Date: 08/16/2020 CLINICAL DATA:  Right upper quadrant abdominal pain EXAM: ULTRASOUND ABDOMEN LIMITED RIGHT UPPER QUADRANT COMPARISON:  CT dated May 22, 2020 FINDINGS: Gallbladder: There is cholelithiasis. The gallbladder wall is thickened measuring approximately 5 mm. There is pericholecystic free fluid. The sonographic Percell Miller sign is reported as  negative. Common bile duct: Diameter: 5 mm Liver: No focal lesion identified. Within normal limits in parenchymal echogenicity. Portal vein is patent on color Doppler imaging with normal  direction of blood flow towards the liver. Other: None. IMPRESSION: Findings are equivocal for acute calculus cholecystitis. While there is gallbladder wall thickening in the presence of gallstones and pericholecystic free fluid, the sonographic Percell Miller sign is reported as negative. Surgical consultation is recommended. Consider HIDA scan for further evaluation. Electronically Signed   By: Constance Holster M.D.   On: 08/16/2020 17:12    Microbiology: Recent Results (from the past 240 hour(s))  Respiratory Panel by RT PCR (Flu A&B, Covid) - Nasopharyngeal Swab     Status: None   Collection Time: 08/16/20  6:17 PM   Specimen: Nasopharyngeal Swab  Result Value Ref Range Status   SARS Coronavirus 2 by RT PCR NEGATIVE NEGATIVE Final    Comment: (NOTE) SARS-CoV-2 target nucleic acids are NOT DETECTED.  The SARS-CoV-2 RNA is generally detectable in upper respiratoy specimens during the acute phase of infection. The lowest concentration of SARS-CoV-2 viral copies this assay can detect is 131 copies/mL. A negative result does not preclude SARS-Cov-2 infection and should not be used as the sole basis for treatment or other patient management decisions. A negative result may occur with  improper specimen collection/handling, submission of specimen other than nasopharyngeal swab, presence of viral mutation(s) within the areas targeted by this assay, and inadequate number of viral copies (<131 copies/mL). A negative result must be combined with clinical observations, patient history, and epidemiological information. The expected result is Negative.  Fact Sheet for Patients:  PinkCheek.be  Fact Sheet for Healthcare Providers:  GravelBags.it  This test is no t yet approved or cleared by the [place] FDA and  has been authorized for detection and/or diagnosis of SARS-CoV-2 by FDA under an Emergency Use Authorization (EUA). This EUA will  remain  in effect (meaning this test can be used) for the duration of the COVID-19 declaration under Section 564(b)(1) of the Act, 21 U.S.C. section 360bbb-3(b)(1), unless the authorization is terminated or revoked sooner.     Influenza A by PCR NEGATIVE NEGATIVE Final   Influenza B by PCR NEGATIVE NEGATIVE Final    Comment: (NOTE) The Xpert Xpress SARS-CoV-2/FLU/RSV assay is intended as an aid in  the diagnosis of influenza from Nasopharyngeal swab specimens and  should not be used as a sole basis for treatment. Nasal washings and  aspirates are unacceptable for Xpert Xpress SARS-CoV-2/FLU/RSV  testing.  Fact Sheet for Patients: PinkCheek.be  Fact Sheet for Healthcare Providers: GravelBags.it  This test is not yet approved or cleared by the [place] FDA and  has been authorized for detection and/or diagnosis of SARS-CoV-2 by  FDA under an Emergency Use Authorization (EUA). This EUA will remain  in effect (meaning this test can be used) for the duration of the  Covid-19 declaration under Section 564(b)(1) of the Act, 21  U.S.C. section 360bbb-3(b)(1), unless the authorization is  terminated or revoked. Performed at [facility], [place] [address] [place], [place] [address]   Blood culture (routine single)     Status: None (Preliminary result)   Collection Time: 08/16/20 10:07 PM   Specimen: BLOOD  Result Value Ref Range Status   Specimen Description   Final    BLOOD LEFT HAND Performed at [place] [address] [place], [place] [address]    Special Requests   Final  BOTTLES DRAWN AEROBIC ONLY Blood Culture results may not be optimal due to an inadequate volume of blood received in culture bottles Performed at [facility], [place] [address], [place], [place] [address]    Culture   Final    NO GROWTH 4 DAYS Performed at [facility], [place] [address] [place], [place] [address]    Report Status PENDING  Incomplete  Urine culture     Status: None   Collection Time: 08/17/20  2:52 AM   Specimen: In/Out Cath Urine  Result Value Ref Range Status   Specimen Description   Final    IN/OUT CATH URINE Performed at [place] [address], [place], [place] [address]    Special Requests   Final    NONE Performed at [facility], [place] [address] [place], [place] [address]    Culture   Final    NO GROWTH Performed at [facility], [place] [address] [place], [place] [address]    Report Status 08/18/2020 FINAL  Final  Culture, blood (single)     Status: None (Preliminary result)   Collection Time: 08/17/20  6:00 AM   Specimen: BLOOD LEFT HAND  Result Value Ref Range Status   Specimen Description   Final    BLOOD LEFT HAND Performed at [place] [address] [place], [place] [address]    Special Requests   Final    BOTTLES DRAWN AEROBIC AND ANAEROBIC Blood Culture adequate volume Performed at [place] [address] [place], [place] [address]    Culture   Final    NO GROWTH 4 DAYS Performed at [facility], [place] [address], [place], [place] [address]    Report Status PENDING  Incomplete  Body fluid culture     Status: None   Collection Time: 08/17/20  5:30 PM   Specimen: Gallbladder; Body Fluid  Result Value Ref Range Status   Specimen Description   Final    GALL BLADDER BILE Performed at [place] [address] [place], [place] [address]    Special Requests   Final    Normal Performed at [facility], [place] [address] [place], [place] [address]    Gram Stain   Final    ABUNDANT WBC PRESENT, PREDOMINANTLY PMN RARE GRAM NEGATIVE RODS Performed at [facility], [place] [address] [place], [place] [address]    Culture RARE KLEBSIELLA PNEUMONIAE  Final   Report Status  08/20/2020 FINAL  Final   Organism ID, Bacteria KLEBSIELLA PNEUMONIAE  Final      Susceptibility   Klebsiella pneumoniae - MIC*    AMPICILLIN RESISTANT Resistant     CEFAZOLIN <=4 SENSITIVE Sensitive     CEFEPIME <=0.12 SENSITIVE Sensitive     CEFTAZIDIME <=1 SENSITIVE Sensitive     CEFTRIAXONE <=0.25 SENSITIVE Sensitive     CIPROFLOXACIN <=0.25 SENSITIVE Sensitive     GENTAMICIN <=1 SENSITIVE Sensitive     IMIPENEM <=0.25 SENSITIVE Sensitive     TRIMETH/SULFA <=20 SENSITIVE Sensitive     AMPICILLIN/SULBACTAM 4 SENSITIVE Sensitive     PIP/TAZO <=4 SENSITIVE Sensitive     * RARE KLEBSIELLA PNEUMONIAE  SARS CORONAVIRUS 2 (TAT 6-24 HRS) Nasopharyngeal Nasopharyngeal Swab     Status: None   Collection Time: 08/20/20  4:57 PM   Specimen: Nasopharyngeal Swab  Result Value Ref Range Status   SARS Coronavirus 2 NEGATIVE NEGATIVE  Final    Comment: (NOTE) SARS-CoV-2 target nucleic acids are NOT DETECTED.  The SARS-CoV-2 RNA is generally detectable in upper and lower respiratory specimens during the acute phase of infection. Negative results do not preclude SARS-CoV-2 infection, do not rule out co-infections with other pathogens, and should not be used as the sole basis for treatment or other patient management decisions. Negative results must be combined with clinical observations, patient history, and epidemiological information. The expected result is Negative.  Fact Sheet for Patients: SugarRoll.be  Fact Sheet for Healthcare Providers: https://www.woods-mathews.com/  This test is not yet approved or cleared by the [place] FDA and  has been authorized for detection and/or diagnosis of SARS-CoV-2 by FDA under an Emergency Use Authorization (EUA). This EUA will remain  in effect (meaning this test can be used) for the duration of the COVID-19 declaration under Se ction 564(b)(1) of the Act, 21 U.S.C. section 360bbb-3(b)(1), unless the  authorization is terminated or revoked sooner.  Performed at [facility], [place] [address] [place], [place] [address]      Labs: Basic Metabolic Panel: Recent Labs  Lab 08/17/20 0051 08/18/20 0905 08/19/20 0436 08/20/20 0244 08/21/20 0403  NA 137 136 135 136 135  K 3.1* 3.8 3.5 3.3* 3.3*  CL 107 108 106 105 104  CO2 23 19* 23 23 25   GLUCOSE 85 52* 89 79 78  BUN 10 7* 5* <5* <5*  CREATININE 0.43* 0.50 0.40* 0.41* 0.35*  CALCIUM 8.2* 8.2* 8.3* 8.2* 8.4*  MG  --   --  1.8  --   --    Liver Function Tests: Recent Labs  Lab 08/17/20 0051 08/18/20 0905 08/19/20 0436 08/20/20 0244 08/21/20 0403  AST 12* 13* 12* 9* 30  ALT 10 11 10 10 14   ALKPHOS 60 61 64 56 65  BILITOT 1.0 1.2 0.6 0.6 0.5  PROT 4.7* 4.8* 4.8* 4.6* 4.9*  ALBUMIN 2.3* 2.1* 2.1* 2.1* 2.2*   Recent Labs  Lab 08/16/20 1441  LIPASE 16   No results for input(s): AMMONIA in the last 168 hours. CBC: Recent Labs  Lab 08/17/20 0051 08/18/20 0905 08/19/20 0436 08/20/20 0244 08/21/20 0403  WBC 14.3* 8.7 5.9 5.5 5.2  NEUTROABS  --  6.6 3.6 3.1 2.7  HGB 10.2* 9.7* 9.5* 9.4* 10.0*  HCT 32.3* 31.3* 29.9* 30.6* 31.9*  MCV 101.6* 101.6* 99.3 99.7 97.9  PLT 121* 131* 151 158 193   Cardiac Enzymes: No results for input(s): CKTOTAL, CKMB, CKMBINDEX, TROPONINI in the last 168 hours. BNP: BNP (last 3 results) Recent Labs    07/20/20 0629 07/21/20 0700 07/22/20 0328  BNP 1,560.3* 1,382.8* 1,402.1*    ProBNP (last 3 results) No results for input(s): PROBNP in the last 8760 hours.  CBG: No results for input(s): GLUCAP in the last 168 hours.     Signed:  Nita Sells MD   [facility] 08/21/2020, 3:51 PM

## 2020-08-21 NOTE — Progress Notes (Signed)
Physical Therapy Treatment Patient Details Name: Lauren Santana MRN: [id number] DOB: Apr 09, 1951 Today's Date: 08/21/2020    History of Present Illness 69 yo female admitted for Sepsis secondary to possible cholecystitis and s/p perc drain.  Hx of lymphoma, L TKA 2015, PE, T10/T11 fractures    PT Comments    Pt assisted with ambulating short distance in hallway and currently requiring at least min assist.  Pt's son present today and reports pt lives alone and does not have assist.  Pt also had a fall prior to admission.  Pt and son are agreeable for SNF at this time.    Follow Up Recommendations  SNF     Equipment Recommendations  None recommended by PT    Recommendations for Other Services       Precautions / Restrictions Precautions Precautions: Fall Precaution Comments: R perc drain    Mobility  Bed Mobility Overal bed mobility: Needs Assistance Bed Mobility: Supine to Sit     Supine to sit: HOB elevated;Min guard     General bed mobility comments: increased time and effort  Transfers Overall transfer level: Needs assistance Equipment used: Rolling walker (2 wheeled) Transfers: Sit to/from Stand Sit to Stand: Min assist         General transfer comment: assist to rise and steady, cues for hand placement  Ambulation/Gait Ambulation/Gait assistance: Min guard;Min assist Gait Distance (Feet): 60 Feet Assistive device: Rolling walker (2 wheeled) Gait Pattern/deviations: Step-through pattern;Decreased stride length;Trunk flexed     General Gait Details: initially min assist for steadying, pt with forward trunk flexion with forearm resting on walker (reports endometriosis pain)   Stairs             Wheelchair Mobility    Modified Rankin (Stroke Patients Only)       Balance Overall balance assessment: Needs assistance         Standing balance support: Bilateral upper extremity supported Standing balance-Leahy Scale: Poor Standing  balance comment: reliant on UE support                            Cognition Arousal/Alertness: Awake/alert Behavior During Therapy: WFL for tasks assessed/performed Overall Cognitive Status: Within Functional Limits for tasks assessed                                        Exercises      General Comments        Pertinent Vitals/Pain Pain Assessment: No/denies pain    Home Living                      Prior Function            PT Goals (current goals can now be found in the care plan section) Progress towards PT goals: Progressing toward goals    Frequency    Min 3X/week      PT Plan Discharge plan needs to be updated    Co-evaluation              AM-PAC PT "6 Clicks" Mobility   Outcome Measure  Help needed turning from your back to your side while in a flat bed without using bedrails?: A Little Help needed moving from lying on your back to sitting on the side of a flat bed without using bedrails?: A Little Help needed  moving to and from a bed to a chair (including a wheelchair)?: A Little Help needed standing up from a chair using your arms (e.g., wheelchair or bedside chair)?: A Little Help needed to walk in hospital room?: A Little Help needed climbing 3-5 steps with a railing? : Total 6 Click Score: 16    End of Session Equipment Utilized During Treatment: Gait belt Activity Tolerance: Patient tolerated treatment well Patient left: with call bell/phone within reach;in chair;with chair alarm set;with family/visitor present Nurse Communication: Mobility status PT Visit Diagnosis: Difficulty in walking, not elsewhere classified (R26.2);Muscle weakness (generalized) (M62.81)     Time: 7989-2119 PT Time Calculation (min) (ACUTE ONLY): 17 min  Charges:  $Gait Training: 8-22 mins                     Arlyce Dice, DPT Acute Rehabilitation Services Pager: [phone] Office: [phone]   Trena Platt 08/21/2020, 12:46 PM

## 2020-08-22 DIAGNOSIS — R5381 Other malaise: Secondary | ICD-10-CM

## 2020-08-22 DIAGNOSIS — G894 Chronic pain syndrome: Secondary | ICD-10-CM

## 2020-08-22 DIAGNOSIS — I4891 Unspecified atrial fibrillation: Secondary | ICD-10-CM

## 2020-08-22 DIAGNOSIS — R1084 Generalized abdominal pain: Secondary | ICD-10-CM

## 2020-08-22 DIAGNOSIS — I2692 Saddle embolus of pulmonary artery without acute cor pulmonale: Secondary | ICD-10-CM

## 2020-08-22 DIAGNOSIS — D638 Anemia in other chronic diseases classified elsewhere: Secondary | ICD-10-CM

## 2020-08-22 DIAGNOSIS — C8330 Diffuse large B-cell lymphoma, unspecified site: Secondary | ICD-10-CM

## 2020-08-22 LAB — CULTURE, BLOOD (SINGLE)
Culture: NO GROWTH
Culture: NO GROWTH
Special Requests: ADEQUATE

## 2020-08-22 MED ORDER — ALUM & MAG HYDROXIDE-SIMETH 200-200-20 MG/5ML PO SUSP
30.0000 mL | ORAL | Status: DC | PRN
  Administered 2020-08-22 – 2020-08-24 (×4): 30 mL via ORAL
  Filled 2020-08-22 (×4): qty 30

## 2020-08-22 MED ORDER — ALUM & MAG HYDROXIDE-SIMETH 200-200-20 MG/5ML PO SUSP
30.0000 mL | Freq: Once | ORAL | Status: AC
  Administered 2020-08-22: 30 mL via ORAL
  Filled 2020-08-22: qty 30

## 2020-08-22 NOTE — Progress Notes (Signed)
PROGRESS NOTE  Lauren Santana YCX:[id number] DOB: 02-Oct-1951   PCP: Lucianne Lei, MD  Patient is from: Home.  Uses walker at baseline.  Independent for most ADLs per son.  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 fib, DM-2, HTN and HLD presented to ED on 9/23 with nausea, vomiting and abdominal pain.  She was admitted for severe sepsis in the setting of calculus cholecystitis.  She was a started on IV cefepime and Flagyl.  Surgery consulted and recommended PERC drain placement and IV antibiotics.  Patient underwent PERC drain placement by IR on 08/17/2020.  IR recommended outpatient follow-up in 6 weeks.  Patient was recommended by therapy who recommended SNF.  Subjective: Seen and examined earlier this morning.  No major events overnight of this morning.  No complaints.  She denies pain, nausea, vomiting, dyspnea or UTI symptoms.  Objective: Vitals:   08/21/20 1316 08/21/20 2038 08/22/20 0446 08/22/20 1358  BP: 118/61 (!) 123/106 122/62 (!) 149/117  Pulse: 96 95 93 89  Resp: 18 18 18    Temp: 98.8 F (37.1 C) 99.3 F (37.4 C) 98.7 F (37.1 C) 98.6 F (37 C)  TempSrc: Oral Oral Oral Oral  SpO2: 100% 100% 96% 98%  Weight:      Height:        Intake/Output Summary (Last 24 hours) at 08/22/2020 1706 Last data filed at 08/22/2020 1427 Gross per 24 hour  Intake 480 ml  Output 1425 ml  Net -945 ml   Filed Weights   08/16/20 2138  Weight: 78.3 kg    Examination:  GENERAL: No apparent distress.  Nontoxic. HEENT: MMM.  Vision and hearing grossly intact.  NECK: Supple.  No apparent JVD.  RESP: On room air.  No IWOB.  Fair aeration bilaterally. CVS:  RRR. Heart sounds normal.  ABD/GI/GU: BS+. Abd soft, NTND.  PERC drain over RUQ.  MSK/EXT:  Moves extremities. No apparent deformity. No edema.  SKIN: no apparent skin lesion  or wound NEURO: Awake, alert and oriented fairly..  No apparent focal neuro deficit other than generalized weakness PSYCH: Calm. Normal affect.  Procedures:  08/16/2020-percutaneous drain placement for acute calculus cholecystitis  Microbiology summarized: 9/23-COVID-19 and influenza PCR negative. 9/23-urine cultures negative. 9/24-biliary fluid with Klebsiella pneumonia resistant to ampicillin 9/24-blood cultures negative.  Assessment & Plan: Severe sepsis due to acute calculus cholecystitis-present on admission as evidenced by fever, tachycardia, leukocytosis and significant lactic acidosis.  General surgery did not feel patient was surgical candidate.  Percutaneous drain placed by IR on 9/23.  Fluid culture with Klebsiella pneumonia. -IV cefepime and Flagyl 9/23> 9/27.  Augmentin 9/27>> 10/6 -Outpatient follow-up with IR in 6 weeks  Recurrent PE-with recent large saddle PE.  Previously treated with Xarelto for 6 months when first diagnosed in 2017. -Continue IV Lovenox every 12 hours.  We will run by her oncologist to see if DOAC is a possibility -Outpatient follow-up  Diffuse large B-cell lymphoma-followed by Dr. Jolayne Haines, oncology at [place] follow-up by oncology  Paroxysmal A. Fib: Not on rate or rhythm control. -Anticoagulation as above  Controlled DM-2?  Not on diabetic medication at home -CBG monitoring discontinued  Essential hypertension?  Does not seem to be on antihypertensive medication at home -Continue monitoring  Hyperlipidemia: On atorvastatin at home.  Indication?  Chronic pain syndrome-on Percocet, gabapentin, Voltaren gel at home.  -Recommend  weaning dose  History of endometriosis?-on Provera at home.  Could increase your risk of VTE -Discontinue Provera. Doubt risk for endometriosis at her age  Chronic hypokalemia -Continue p.o. KCl  Leukocytosis: Likely due to #1.  Resolved.  Hyperbilirubinemia: Likely due to #1.  Resolved.  Anemia  of chronic disease: H&H stable. -Continue monitoring  Debility/physical deconditioning-per patient's son, patient was able to handle her ADL's after released from SNF about a week prior to this admission.  She uses walker at baseline. -PT/OT recommended SNF.    Class I obesity Body mass index is 32.63 kg/m.      Stage I pressure skin injury: POA Pressure Injury 08/16/20 Buttocks Anterior;Upper Stage 2 -  Partial thickness loss of dermis presenting as a shallow open injury with a red, pink wound bed without slough. (Active)  08/16/20 2130  Location: Buttocks  Location Orientation: Anterior;Upper  Staging: Stage 2 -  Partial thickness loss of dermis presenting as a shallow open injury with a red, pink wound bed without slough.  Wound Description (Comments):   Present on Admission: Yes   DVT prophylaxis:  On therapeutic dose Lovenox  Code Status: Full code Family Communication: Updated patient's son over the phone. Status is: Inpatient  Remains inpatient appropriate because:Unsafe d/c plan   Dispo: The patient is from: Home              Anticipated d/c is to: SNF              Anticipated d/c date is: 1 day              Patient currently is medically stable to d/c.       Consultants:  General surgery IR   Sch Meds:  Scheduled Meds: . alum & mag hydroxide-simeth  30 mL Oral Once  . amoxicillin-clavulanate  1 tablet Oral Q12H  . Chlorhexidine Gluconate Cloth  6 each Topical Daily  . enoxaparin (LOVENOX) injection  80 mg Subcutaneous BID  . potassium chloride  40 mEq Oral Daily  . sodium chloride flush  10-40 mL Intracatheter Q12H   Continuous Infusions: . lactated ringers Stopped (08/16/20 2211)   PRN Meds:.bisacodyl, morphine injection, ondansetron **OR** ondansetron (ZOFRAN) IV, oxyCODONE-acetaminophen, prochlorperazine, sodium chloride flush  Antimicrobials: Anti-infectives (From admission, onward)   Start     Dose/Rate Route Frequency Ordered Stop    08/21/20 0000  amoxicillin-clavulanate (AUGMENTIN) 875-125 MG tablet        1 tablet Oral Every 12 hours 08/21/20 1551 08/29/20 2359   08/20/20 1400  amoxicillin-clavulanate (AUGMENTIN) 875-125 MG per tablet 1 tablet        1 tablet Oral Every 12 hours 08/20/20 1247     08/17/20 0600  ceFEPIme (MAXIPIME) 2 g in sodium chloride 0.9 % 100 mL IVPB  Status:  Discontinued        2 g 200 mL/hr over 30 Minutes Intravenous Every 8 hours 08/16/20 1840 08/16/20 2002   08/16/20 2200  ceFEPIme (MAXIPIME) 2 g in sodium chloride 0.9 % 100 mL IVPB  Status:  Discontinued        2 g 200 mL/hr over 30 Minutes Intravenous Every 8 hours 08/16/20 2002 08/16/20 2005   08/16/20 2030  metroNIDAZOLE (FLAGYL) IVPB 500 mg  Status:  Discontinued        500 mg 100 mL/hr over 60 Minutes Intravenous Every 8 hours 08/16/20 2001 08/20/20 1247   08/16/20 2015  ceFEPIme (MAXIPIME) 2 g in sodium chloride 0.9 % 100 mL IVPB  Status:  Discontinued        2 g 200 mL/hr over 30 Minutes Intravenous Every 8 hours 08/16/20 2002 08/20/20 1247   08/16/20 1815  ceFEPIme (MAXIPIME) 2 g in sodium chloride 0.9 % 100 mL IVPB  Status:  Discontinued        2 g 200 mL/hr over 30 Minutes Intravenous  Once 08/16/20 1811 08/16/20 2001   08/16/20 1815  metroNIDAZOLE (FLAGYL) IVPB 500 mg  Status:  Discontinued        500 mg 100 mL/hr over 60 Minutes Intravenous  Once 08/16/20 1811 08/16/20 2001       I have personally reviewed the following labs and images: CBC: Recent Labs  Lab 08/17/20 0051 08/18/20 0905 08/19/20 0436 08/20/20 0244 08/21/20 0403  WBC 14.3* 8.7 5.9 5.5 5.2  NEUTROABS  --  6.6 3.6 3.1 2.7  HGB 10.2* 9.7* 9.5* 9.4* 10.0*  HCT 32.3* 31.3* 29.9* 30.6* 31.9*  MCV 101.6* 101.6* 99.3 99.7 97.9  PLT 121* 131* 151 158 193   BMP &GFR Recent Labs  Lab 08/17/20 0051 08/18/20 0905 08/19/20 0436 08/20/20 0244 08/21/20 0403  NA 137 136 135 136 135  K 3.1* 3.8 3.5 3.3* 3.3*  CL 107 108 106 105 104  CO2 23 19* 23 23 25     GLUCOSE 85 52* 89 79 78  BUN 10 7* 5* <5* <5*  CREATININE 0.43* 0.50 0.40* 0.41* 0.35*  CALCIUM 8.2* 8.2* 8.3* 8.2* 8.4*  MG  --   --  1.8  --   --    Estimated Creatinine Clearance: 62 mL/min (A) (by C-G formula based on SCr of 0.35 mg/dL (L)). Liver & Pancreas: Recent Labs  Lab 08/17/20 0051 08/18/20 0905 08/19/20 0436 08/20/20 0244 08/21/20 0403  AST 12* 13* 12* 9* 30  ALT 10 11 10 10 14   ALKPHOS 60 61 64 56 65  BILITOT 1.0 1.2 0.6 0.6 0.5  PROT 4.7* 4.8* 4.8* 4.6* 4.9*  ALBUMIN 2.3* 2.1* 2.1* 2.1* 2.2*   Recent Labs  Lab 08/16/20 1441  LIPASE 16   No results for input(s): AMMONIA in the last 168 hours. Diabetic: No results for input(s): HGBA1C in the last 72 hours. No results for input(s): GLUCAP in the last 168 hours. Cardiac Enzymes: No results for input(s): CKTOTAL, CKMB, CKMBINDEX, TROPONINI in the last 168 hours. No results for input(s): PROBNP in the last 8760 hours. Coagulation Profile: Recent Labs  Lab 08/16/20 2207 08/17/20 0051  INR 1.3* 1.3*   Thyroid Function Tests: No results for input(s): TSH, T4TOTAL, FREET4, T3FREE, THYROIDAB in the last 72 hours. Lipid Profile: No results for input(s): CHOL, HDL, LDLCALC, TRIG, CHOLHDL, LDLDIRECT in the last 72 hours. Anemia Panel: No results for input(s): VITAMINB12, FOLATE, FERRITIN, TIBC, IRON, RETICCTPCT in the last 72 hours. Urine analysis:    Component Value Date/Time   COLORURINE AMBER (A) 08/17/2020 0251   APPEARANCEUR CLEAR 08/17/2020 0251   LABSPEC 1.026 08/17/2020 0251   PHURINE 6.0 08/17/2020 0251   GLUCOSEU NEGATIVE 08/17/2020 0251   HGBUR NEGATIVE 08/17/2020 0251   BILIRUBINUR SMALL (A) 08/17/2020 0251   KETONESUR NEGATIVE 08/17/2020 0251   PROTEINUR 30 (A) 08/17/2020 0251   UROBILINOGEN 0.2 12/26/2016 1611   NITRITE NEGATIVE 08/17/2020 0251   LEUKOCYTESUR SMALL (A) 08/17/2020 0251   Sepsis Labs: Invalid input(s): PROCALCITONIN, [place]  Microbiology: Recent Results (from the  past 240 hour(s))  Respiratory Panel by RT PCR (Flu A&B, Covid) - Nasopharyngeal Swab     Status: None  Collection Time: 08/16/20  6:17 PM   Specimen: Nasopharyngeal Swab  Result Value Ref Range Status   SARS Coronavirus 2 by RT PCR NEGATIVE NEGATIVE Final    Comment: (NOTE) SARS-CoV-2 target nucleic acids are NOT DETECTED.  The SARS-CoV-2 RNA is generally detectable in upper respiratoy specimens during the acute phase of infection. The lowest concentration of SARS-CoV-2 viral copies this assay can detect is 131 copies/mL. A negative result does not preclude SARS-Cov-2 infection and should not be used as the sole basis for treatment or other patient management decisions. A negative result may occur with  improper specimen collection/handling, submission of specimen other than nasopharyngeal swab, presence of viral mutation(s) within the areas targeted by this assay, and inadequate number of viral copies (<131 copies/mL). A negative result must be combined with clinical observations, patient history, and epidemiological information. The expected result is Negative.  Fact Sheet for Patients:  PinkCheek.be  Fact Sheet for Healthcare Providers:  GravelBags.it  This test is no t yet approved or cleared by the [place] FDA and  has been authorized for detection and/or diagnosis of SARS-CoV-2 by FDA under an Emergency Use Authorization (EUA). This EUA will remain  in effect (meaning this test can be used) for the duration of the COVID-19 declaration under Section 564(b)(1) of the Act, 21 U.S.C. section 360bbb-3(b)(1), unless the authorization is terminated or revoked sooner.     Influenza A by PCR NEGATIVE NEGATIVE Final   Influenza B by PCR NEGATIVE NEGATIVE Final    Comment: (NOTE) The Xpert Xpress SARS-CoV-2/FLU/RSV assay is intended as an aid in  the diagnosis of influenza from Nasopharyngeal swab specimens and    should not be used as a sole basis for treatment. Nasal washings and  aspirates are unacceptable for Xpert Xpress SARS-CoV-2/FLU/RSV  testing.  Fact Sheet for Patients: PinkCheek.be  Fact Sheet for Healthcare Providers: GravelBags.it  This test is not yet approved or cleared by the [place] FDA and  has been authorized for detection and/or diagnosis of SARS-CoV-2 by  FDA under an Emergency Use Authorization (EUA). This EUA will remain  in effect (meaning this test can be used) for the duration of the  Covid-19 declaration under Section 564(b)(1) of the Act, 21  U.S.C. section 360bbb-3(b)(1), unless the authorization is  terminated or revoked. Performed at [facility], [place] [address] [place], [place] [address]   Blood culture (routine single)     Status: None   Collection Time: 08/16/20 10:07 PM   Specimen: BLOOD  Result Value Ref Range Status   Specimen Description   Final    BLOOD LEFT HAND Performed at [place] [address] [place], [place] [address]    Special Requests   Final    BOTTLES DRAWN AEROBIC ONLY Blood Culture results may not be optimal due to an inadequate volume of blood received in culture bottles Performed at [place] [address], [place], [place] [address]    Culture   Final    NO GROWTH 5 DAYS Performed at [facility], [place] [address], [place], [place] [address]    Report Status 08/22/2020 FINAL  Final  Urine culture     Status: None   Collection Time: 08/17/20  2:52 AM   Specimen: In/Out Cath Urine  Result Value Ref Range Status   Specimen Description   Final    IN/OUT CATH URINE Performed at [facility] [place], [place]  [address]    Special Requests   Final    NONE Performed at [facility], [place] [address], [place], [place] [address]    Culture    Final    NO GROWTH Performed at [facility], [place] [address], [place], [place] [address]    Report Status 08/18/2020 FINAL  Final  Culture, blood (single)     Status: None   Collection Time: 08/17/20  6:00 AM   Specimen: BLOOD LEFT HAND  Result Value Ref Range Status   Specimen Description   Final    BLOOD LEFT HAND Performed at [facility] [address], [place], [place] [address]    Special Requests   Final    BOTTLES DRAWN AEROBIC AND ANAEROBIC Blood Culture adequate volume Performed at [facility] [address] [place], [place] [address]    Culture   Final    NO GROWTH 5 DAYS Performed at [facility], [place] [address], [place], [place] [address]    Report Status 08/22/2020 FINAL  Final  Body fluid culture     Status: None   Collection Time: 08/17/20  5:30 PM   Specimen: Gallbladder; Body Fluid  Result Value Ref Range Status   Specimen Description   Final    GALL BLADDER BILE Performed at [place] [address], [place], [place] [address]    Special Requests   Final    Normal Performed at [facility], [place] [address], [place], [place] [address]    Gram Stain   Final    ABUNDANT WBC PRESENT, PREDOMINANTLY PMN RARE GRAM NEGATIVE RODS Performed at [facility], [place] [address] [place], [place] [address]    Culture RARE KLEBSIELLA PNEUMONIAE  Final   Report Status 08/20/2020 FINAL  Final   Organism ID, Bacteria KLEBSIELLA PNEUMONIAE  Final      Susceptibility   Klebsiella pneumoniae - MIC*    AMPICILLIN RESISTANT Resistant     CEFAZOLIN <=4 SENSITIVE Sensitive     CEFEPIME <=0.12 SENSITIVE Sensitive     CEFTAZIDIME <=1 SENSITIVE Sensitive     CEFTRIAXONE <=0.25 SENSITIVE Sensitive     CIPROFLOXACIN <=0.25 SENSITIVE Sensitive     GENTAMICIN <=1 SENSITIVE Sensitive     IMIPENEM <=0.25 SENSITIVE Sensitive     TRIMETH/SULFA <=20 SENSITIVE Sensitive      AMPICILLIN/SULBACTAM 4 SENSITIVE Sensitive     PIP/TAZO <=4 SENSITIVE Sensitive     * RARE KLEBSIELLA PNEUMONIAE  SARS CORONAVIRUS 2 (TAT 6-24 HRS) Nasopharyngeal Nasopharyngeal Swab     Status: None   Collection Time: 08/20/20  4:57 PM   Specimen: Nasopharyngeal Swab  Result Value Ref Range Status   SARS Coronavirus 2 NEGATIVE NEGATIVE Final    Comment: (NOTE) SARS-CoV-2 target nucleic acids are NOT DETECTED.  The SARS-CoV-2 RNA is generally detectable in upper and lower respiratory specimens during the acute phase of infection. Negative results do not preclude SARS-CoV-2 infection, do not rule out co-infections with other pathogens, and should not be used as the sole basis for treatment or other patient management decisions. Negative results must be combined with clinical observations, patient history, and epidemiological information. The expected result is Negative.  Fact Sheet for Patients: SugarRoll.be  Fact Sheet for Healthcare Providers: https://www.woods-mathews.com/  This test is not yet approved or cleared by the [place] FDA and  has been authorized for detection and/or diagnosis of SARS-CoV-2 by FDA under an Emergency Use  Authorization (EUA). This EUA will remain  in effect (meaning this test can be used) for the duration of the COVID-19 declaration under Se ction 564(b)(1) of the Act, 21 U.S.C. section 360bbb-3(b)(1), unless the authorization is terminated or revoked sooner.  Performed at [facility], [place] [address], [place], [place] [address]     Radiology Studies: No results found.  35 minutes with more than 50% spent in reviewing records, counseling patient/family and coordinating care.   Dajanae Brophy T. [place]  If 7PM-7AM, please contact night-coverage [email or web address] 08/22/2020, 5:06 PM

## 2020-08-22 NOTE — TOC Progression Note (Signed)
Transition of Care [facility]) - Progression Note    Patient Details  Name: Lauren Santana MRN: [id number] Date of Birth: 04-12-1951  Transition [facility]) CM/SW Contact  Layci Stenglein, Juliann Pulse, RN Phone Number: 08/22/2020, 4:20 PM  Clinical Narrative:  HTA has denied SNF/PTAR auth-MD can do a P@P  with Dr. Lynder Parents [phone] by 12p tomorrow.[facility] also following.Son [name] updated.      Expected Discharge Plan: Skilled Nursing Facility Barriers to Discharge: Insurance Authorization  Expected Discharge Plan and Services Expected Discharge Plan: [place]   Discharge Planning Services: CM Consult   Living arrangements for the past 2 months: Single Family Home                                       Social Determinants of Health (SDOH) Interventions    Readmission Risk Interventions Readmission Risk Prevention Plan 07/23/2020  Transportation Screening Complete  Medication Review (RN CM) Complete  Some recent data might be hidden

## 2020-08-22 NOTE — TOC Progression Note (Addendum)
Transition of Care [facility]) - Progression Note    Patient Details  Name: Lauren Santana MRN: [id number] Date of Birth: 07-05-1951  Transition of Care [facility]) CM/SW Contact  Levetta Bognar, Juliann Pulse, RN Phone Number: 08/22/2020, 9:46 AM  Clinical Narrative: Damaris Schooner to son [name] patient defers to with bed offers-await choice. Rogelia Rohrer for SNF, & PTAR pending bed choice-await response.    [facility] chosen-awaiting insurance auth-under med reivew.  Expected Discharge Plan: Skilled Nursing Facility Barriers to Discharge: Insurance Authorization  Expected Discharge Plan and Services Expected Discharge Plan: [place]   Discharge Planning Services: CM Consult   Living arrangements for the past 2 months: Single Family Home                                       Social Determinants of Health (SDOH) Interventions    Readmission Risk Interventions Readmission Risk Prevention Plan 07/23/2020  Transportation Screening Complete  Medication Review (RN CM) Complete  Some recent data might be hidden

## 2020-08-23 DIAGNOSIS — M159 Polyosteoarthritis, unspecified: Secondary | ICD-10-CM | POA: Diagnosis not present

## 2020-08-23 DIAGNOSIS — E785 Hyperlipidemia, unspecified: Secondary | ICD-10-CM | POA: Diagnosis not present

## 2020-08-23 DIAGNOSIS — I10 Essential (primary) hypertension: Secondary | ICD-10-CM | POA: Diagnosis not present

## 2020-08-23 DIAGNOSIS — A419 Sepsis, unspecified organism: Secondary | ICD-10-CM

## 2020-08-23 DIAGNOSIS — R6520 Severe sepsis without septic shock: Secondary | ICD-10-CM

## 2020-08-23 DIAGNOSIS — E1169 Type 2 diabetes mellitus with other specified complication: Secondary | ICD-10-CM | POA: Diagnosis not present

## 2020-08-23 LAB — RENAL FUNCTION PANEL
Albumin: 2.2 g/dL — ABNORMAL LOW (ref 3.5–5.0)
Anion gap: 7 (ref 5–15)
BUN: <5 mg/dL — ABNORMAL LOW (ref 8–23)
CO2: 25 mmol/L (ref 22–32)
Calcium: 8.6 mg/dL — ABNORMAL LOW (ref 8.9–10.3)
Chloride: 104 mmol/L (ref 98–111)
Creatinine, Ser: 0.42 mg/dL — ABNORMAL LOW (ref 0.44–1.00)
GFR calc Af Amer: >60 mL/min (ref >60)
GFR calc non Af Amer: >60 mL/min (ref >60)
Glucose, Bld: 84 mg/dL (ref 70–99)
Phosphorus: 2.9 mg/dL (ref 2.5–4.6)
Potassium: 3.6 mmol/L (ref 3.5–5.1)
Sodium: 136 mmol/L (ref 135–145)

## 2020-08-23 LAB — MAGNESIUM: Magnesium: 1.7 mg/dL (ref 1.7–2.4)

## 2020-08-23 MED ORDER — RIVAROXABAN 20 MG PO TABS
20.0000 mg | ORAL_TABLET | Freq: Every day | ORAL | 1 refills | Status: DC
Start: 2020-08-23 — End: 2023-03-10

## 2020-08-23 MED ORDER — PROBIOTIC ACIDOPHILUS PO CAPS: 1.0000 | ORAL_CAPSULE | Freq: Two times a day (BID) | ORAL | 0 refills | Status: DC

## 2020-08-23 MED ORDER — AMOXICILLIN-POT CLAVULANATE 875-125 MG PO TABS: 1.0000 | ORAL_TABLET | Freq: Two times a day (BID) | ORAL | 0 refills | Status: AC

## 2020-08-23 MED ORDER — OXYCODONE HCL 5 MG PO TABS: 5.0000 mg | ORAL_TABLET | Freq: Three times a day (TID) | ORAL | 0 refills | Status: AC | PRN

## 2020-08-23 MED ORDER — ACETAMINOPHEN 500 MG PO TABS: 500.0000 mg | ORAL_TABLET | Freq: Three times a day (TID) | ORAL | 2 refills | Status: AC

## 2020-08-23 MED ORDER — RIVAROXABAN 20 MG PO TABS
20.0000 mg | ORAL_TABLET | Freq: Every day | ORAL | Status: DC
  Administered 2020-08-23 – 2020-08-24 (×2): 20 mg via ORAL
  Filled 2020-08-23 (×2): qty 1

## 2020-08-23 NOTE — Discharge Summary (Signed)
Physician Discharge Summary  Lauren Santana IOM:[id number] DOB: 04-09-1951 DOA: 08/16/2020  PCP: Lucianne Lei, MD  Admit date: 08/16/2020 Discharge date: 08/23/2020  Admitted From: Home Disposition: Home  Recommendations for Outpatient Follow-up:  1. Follow ups as below. 2. Please obtain CBC/BMP/Mag at follow up 3. Please follow up on the following pending results: None  Home Health: PT/OT/RN/aide Equipment/Devices: None  Discharge Condition: Stable CODE STATUS: Full code   Follow-up Information    Alphonsa Overall, MD Follow up on 09/21/2020.   Specialty: General Surgery Why: 11:15am, arrive by 10:45am for paperwork and check in process Contact information: [place] [address] [phone]        Greggory Keen, MD Follow up in 4 week(s).   Specialties: Interventional Radiology, Radiology Why: their office should call you with appointment date and time Contact information: [address] [place] [address] [phone]        Lucianne Lei, MD. Schedule an appointment as soon as possible for a visit in 1 week(s).   Specialty: Family Medicine Contact information: [place] [place] [address] [place] [address] [phone]        Minus Breeding, MD .   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 fib, DM-2, HTN and HLD presented to ED on 9/23 with nausea, vomiting and abdominal pain.  She was admitted for severe sepsis in the setting of calculus cholecystitis.  She was a started on IV cefepime and Flagyl.  Surgery consulted and recommended PERC drain placement and IV antibiotics.  Patient underwent PERC drain placement by IR on 08/17/2020.  IR recommended outpatient follow-up in 6 weeks.     Therapy recommended SNF but insurance declined stating patient is at her baseline based on therapy assessments here and when she left the skilled nursing facility.  Family appealing.   See individual problems below for more hospital course.  Discharge Diagnoses:  Severe sepsis due to acute calculus cholecystitis-present on admission as evidenced by fever, tachycardia, leukocytosis and significant lactic acidosis.  General surgery did not feel patient was surgical candidate.  Percutaneous drain placed by IR on 9/23.  Fluid culture with Klebsiella pneumonia. -IV cefepime and Flagyl 9/23> 9/27.  Augmentin 9/27>> 10/6 -Outpatient follow-up with IR in 6 weeks -Outpatient follow-up with general surgery as above  Recurrent PE-with recent large saddle PE.  Previously treated with Xarelto for 6 months when first diagnosed in 2017.  Recently discharged on IV Lovenox -Change to p.o. Xarelto 20 mg daily -Outpatient follow-up  Diffuse large B-cell lymphoma-followed by Dr. Jolayne Haines, oncology at [facility] follow-up by oncology  Paroxysmal A. Fib: Not on rate or rhythm control. -Anticoagulation as above  Controlled DM-2?  Not on diabetic medication at home -CBG monitoring discontinued  Essential hypertension?  Does not seem to be on antihypertensive medication at home -Continue monitoring  Hyperlipidemia: On atorvastatin at home.  Indication?  Chronic pain syndrome-on Percocet, gabapentin, Voltaren gel at home.  -Recommend weaning if possible  History of endometriosis?-on Provera at home.  Could increase your risk of VTE -Provera discontinued.  Doubt risk for endometriosis at her age  Chronic hypokalemia -Continue p.o. KCl  Leukocytosis: Likely due to #1.  Resolved.  Hyperbilirubinemia: Likely  due to #1.  Resolved.  Anemia of chronic disease: H&H stable. -Recheck CBC at follow-up  Debility: Therapy recommended SNF but insurance declined stating that patient is at  baseline based on therapy assessment here and when she left SNF about a week prior to this admission. Family appealing. -Home health PT/OT/RN/aide ordered.  Class I obesity Body mass index is 32.63 kg/m.       Stage II pressure ulcer: POA Pressure Injury 08/16/20 Buttocks Anterior;Upper Stage 2 -  Partial thickness loss of dermis presenting as a shallow open injury with a red, pink wound bed without slough. (Active)  08/16/20 2130  Location: Buttocks  Location Orientation: Anterior;Upper  Staging: Stage 2 -  Partial thickness loss of dermis presenting as a shallow open injury with a red, pink wound bed without slough.  Wound Description (Comments):   Present on Admission: Yes    Discharge Exam: Vitals:   08/23/20 0623 08/23/20 0920  BP: (!) 119/56 (!) 120/51  Pulse: 91 90  Resp: 14 13  Temp: 99.3 F (37.4 C) 99 F (37.2 C)  SpO2:      GENERAL: No apparent distress.  Nontoxic. HEENT: MMM.  Vision and hearing grossly intact.  NECK: Supple.  No apparent JVD.  RESP:  No IWOB.  Fair aeration bilaterally. CVS:  RRR. Heart sounds normal.  ABD/GI/GU: Bowel sounds present. Soft. Non tender.  PERC drain over RUQ MSK/EXT:  Moves extremities. No apparent deformity. No edema.  SKIN: no apparent skin lesion or wound NEURO: Awake, alert and oriented appropriately.  No apparent focal neuro deficit. PSYCH: Calm. Normal affect.   Discharge Instructions  Discharge Instructions    Call MD for:  difficulty breathing, headache or visual disturbances   Complete by: As directed    Call MD for:  persistant nausea and vomiting   Complete by: As directed    Call MD for:  severe uncontrolled pain   Complete by: As directed    Call MD for:  temperature >100.4   Complete by: As directed    Change dressing (specify)   Complete by: As directed    Continue drain irrigation once daily with 5 cc along with output monitoring and dressing changes every 1 to 2 days   Diet general   Complete by: As  directed    Discharge instructions   Complete by: As directed    It has been a pleasure taking care of you!  You were hospitalized due to gallbladder infection and the stone.  You were treated surgically and medically.  We are discharging you on more antibiotic to complete treatment course.  We recommend follow-up with general surgery and interventional radiology to reassess the gallbladder drain.  We have also change it your blood thinner to a tablet form.    We may have started you on other new medications or made some changes to your home medications during this hospitalization. Please review your new medication list and the directions carefully before you take them.    Please go to your hospital follow-up appointments or call to schedule as recommended.   Take care,   Increase activity slowly   Complete by: As directed      Allergies as of 08/23/2020      Reactions   Tylenol [acetaminophen] Other (See Comments)   States "seems like eats my stomach"   Sulfa Antibiotics Other (See Comments)   Unknown allergic reaction      Medication List    STOP taking these medications  atorvastatin 10 MG tablet Commonly known as: LIPITOR   enoxaparin 80 MG/0.8ML injection Commonly known as: LOVENOX   medroxyPROGESTERone 10 MG tablet Commonly known as: PROVERA   oxyCODONE-acetaminophen 7.5-325 MG tablet Commonly known as: PERCOCET   potassium chloride 10 MEQ tablet Commonly known as: KLOR-CON   sucralfate 1 GM/10ML suspension Commonly known as: CARAFATE     TAKE these medications   acetaminophen 500 MG tablet Commonly known as: TYLENOL Take 1 tablet (500 mg total) by mouth every 8 (eight) hours.   amoxicillin-clavulanate 875-125 MG tablet Commonly known as: AUGMENTIN Take 1 tablet by mouth every 12 (twelve) hours for 7 days.   diclofenac Sodium 1 % Gel Commonly known as: VOLTAREN Apply 2 g topically daily as needed (pain).   gabapentin 300 MG capsule Commonly known  as: NEURONTIN Take 1 capsule (300 mg total) by mouth 3 (three) times daily.   ondansetron 4 MG tablet Commonly known as: ZOFRAN Take 1 tablet (4 mg total) by mouth 2 (two) times daily as needed for nausea.   oxyCODONE 5 MG immediate release tablet Commonly known as: Roxicodone Take 1 tablet (5 mg total) by mouth every 8 (eight) hours as needed for severe pain or breakthrough pain.   potassium chloride SA 20 MEQ tablet Commonly known as: KLOR-CON Take 2 tablets (40 mEq total) by mouth daily.   Probiotic Acidophilus Caps Take 1 tablet by mouth in the morning and at bedtime.   prochlorperazine 5 MG tablet Commonly known as: COMPAZINE Take 1 tablet (5 mg total) by mouth every 6 (six) hours as needed for nausea or vomiting.   rivaroxaban 20 MG Tabs tablet Commonly known as: XARELTO Take 1 tablet (20 mg total) by mouth daily with supper.            Durable Medical Equipment  (From admission, onward)         Start     Ordered   08/23/20 1031  DME Walker  Once       Question Answer Comment  Walker: With 5 Inch Wheels   Patient needs a walker to treat with the following condition Unsteady gait      08/23/20 1039           Discharge Care Instructions  (From admission, onward)         Start     Ordered   08/23/20 0000  Change dressing (specify)       Comments: Continue drain irrigation once daily with 5 cc along with output monitoring and dressing changes every 1 to 2 days   08/23/20 1039          Consultations:  General surgery  IR  Procedures/Studies:  08/16/2020-percutaneous drain placement for acute calculus cholecystitis   NM Hepatobiliary Liver Func  Result Date: 08/17/2020 CLINICAL DATA:  Abdominal pain. Ultrasound imaging suggesting cholecystitis. EXAM: NUCLEAR MEDICINE HEPATOBILIARY IMAGING TECHNIQUE: Sequential images of the abdomen were obtained out to 60 minutes following intravenous administration of radiopharmaceutical.  RADIOPHARMACEUTICALS:  5.5 mCi Tc-62m  Choletec IV COMPARISON:  Ultrasound exam 08/16/2020 FINDINGS: Prompt uptake of radiotracer by the liver parenchyma is evident. Biliary activity is visible by 20 minutes with gut activity noted at 25-30 minutes. After 2 hours of observation, no gallbladder filling is observed. Patient was given 3 mg intravenous morphine and imaged for an additional 30 minutes. A booster dose could not be administered for further imaging due to national radiopharmaceutical shortage. After 30 minutes of observation post morphine, no gallbladder filling is observed.  IMPRESSION: 1. Normal hepatobiliary patency without common bile duct obstruction. 2. No gallbladder filling before or after intravenous morphine administration. Imaging after morphine limited by inability to administer a booster dose of radiopharmaceutical, but imaging features likely reflect cystic duct obstruction. Electronically Signed   By: Misty Stanley M.D.   On: 08/17/2020 14:01   IR Perc Cholecystostomy  Result Date: 08/17/2020 INDICATION: Acute calculus cholecystitis EXAM: ULTRASOUND AND FLUOROSCOPIC PERCUTANEOUS TRANSHEPATIC CHOLECYSTOSTOMY MEDICATIONS: Patient is already receiving antibiotics as an inpatient.; The antibiotic was administered within an appropriate time frame prior to the initiation of the procedure. ANESTHESIA/SEDATION: Moderate (conscious) sedation was employed during this procedure. A total of Versed 0.5 mg and Fentanyl 25 mcg was administered intravenously. Moderate Sedation Time: 10 minutes. The patient's level of consciousness and vital signs were monitored continuously by radiology nursing throughout the procedure under my direct supervision. FLUOROSCOPY TIME:  Fluoroscopy Time: 0 minutes 48 seconds (10 mGy). COMPLICATIONS: None immediate. PROCEDURE: Informed written consent was obtained from the patient after a thorough discussion of the procedural risks, benefits and alternatives. All questions  were addressed. Maximal Sterile Barrier Technique was utilized including caps, mask, sterile gowns, sterile gloves, sterile drape, hand hygiene and skin antiseptic. A timeout was performed prior to the initiation of the procedure. Previous imaging reviewed. Ultrasound was localized in the right upper quadrant. Transhepatic window marked. Under sterile conditions and local anesthesia, a 21 gauge needle was advanced from a right upper quadrant transhepatic approach into the gallbladder. Needle position confirmed with ultrasound. Images obtained for documentation. There was return of exudative bile. 018 guidewire advanced into the gallbladder. Accustick dilator set advanced. Amplatz guidewire exchange performed. Tract dilatation performed to insert a 10 [place] drain. Drain catheter retention loop formed the gallbladder. Position confirmed with fluoroscopy and ultrasound. Images obtained for documentation. Syringe aspiration yielded 20 cc exudative bile. Sample sent for culture. Catheter secured with Prolene suture and connected to external gravity drainage bag. Sterile dressing applied. No immediate complication. Patient tolerated the procedure well. IMPRESSION: Successful ultrasound and fluoroscopic 10 French cholecystostomy placement. Electronically Signed   By: Jerilynn Mages.  Shick M.D.   On: 08/17/2020 17:47   DG Chest Port 1 View  Result Date: 08/16/2020 CLINICAL DATA:  Questionable sepsis - evaluate for abnormality Abdominal pain. EXAM: PORTABLE CHEST 1 VIEW COMPARISON:  Chest radiograph 07/21/2020.  CT 07/19/2020 FINDINGS: Lung volumes are low. Accessed right chest port with tip in the right atrium, unchanged. Similar cardiomegaly. Unchanged mediastinal contours allowing for rotation. Minor subsegmental atelectasis at the lung bases. No confluent airspace disease. No pneumothorax or large pleural effusion. Degenerative change of both shoulders. IMPRESSION: 1. Low lung volumes with mild bibasilar atelectasis. 2.  Unchanged cardiomegaly. Electronically Signed   By: Keith Rake M.D.   On: 08/16/2020 18:03   US Abdomen Limited RUQ  Result Date: 08/16/2020 CLINICAL DATA:  Right upper quadrant abdominal pain EXAM: ULTRASOUND ABDOMEN LIMITED RIGHT UPPER QUADRANT COMPARISON:  CT dated May 22, 2020 FINDINGS: Gallbladder: There is cholelithiasis. The gallbladder wall is thickened measuring approximately 5 mm. There is pericholecystic free fluid. The sonographic Percell Miller sign is reported as negative. Common bile duct: Diameter: 5 mm Liver: No focal lesion identified. Within normal limits in parenchymal echogenicity. Portal vein is patent on color Doppler imaging with normal direction of blood flow towards the liver. Other: None. IMPRESSION: Findings are equivocal for acute calculus cholecystitis. While there is gallbladder wall thickening in the presence of gallstones and pericholecystic free fluid, the sonographic Percell Miller sign is reported as negative.  Surgical consultation is recommended. Consider HIDA scan for further evaluation. Electronically Signed   By: Constance Holster M.D.   On: 08/16/2020 17:12       The results of significant diagnostics from this hospitalization (including imaging, microbiology, ancillary and laboratory) are listed below for reference.     Microbiology: Recent Results (from the past 240 hour(s))  Respiratory Panel by RT PCR (Flu A&B, Covid) - Nasopharyngeal Swab     Status: None   Collection Time: 08/16/20  6:17 PM   Specimen: Nasopharyngeal Swab  Result Value Ref Range Status   SARS Coronavirus 2 by RT PCR NEGATIVE NEGATIVE Final    Comment: (NOTE) SARS-CoV-2 target nucleic acids are NOT DETECTED.  The SARS-CoV-2 RNA is generally detectable in upper respiratoy specimens during the acute phase of infection. The lowest concentration of SARS-CoV-2 viral copies this assay can detect is 131 copies/mL. A negative result does not preclude SARS-Cov-2 infection and should not be used  as the sole basis for treatment or other patient management decisions. A negative result may occur with  improper specimen collection/handling, submission of specimen other than nasopharyngeal swab, presence of viral mutation(s) within the areas targeted by this assay, and inadequate number of viral copies (<131 copies/mL). A negative result must be combined with clinical observations, patient history, and epidemiological information. The expected result is Negative.  Fact Sheet for Patients:  PinkCheek.be  Fact Sheet for Healthcare Providers:  GravelBags.it  This test is no t yet approved or cleared by the [place] FDA and  has been authorized for detection and/or diagnosis of SARS-CoV-2 by FDA under an Emergency Use Authorization (EUA). This EUA will remain  in effect (meaning this test can be used) for the duration of the COVID-19 declaration under Section 564(b)(1) of the Act, 21 U.S.C. section 360bbb-3(b)(1), unless the authorization is terminated or revoked sooner.     Influenza A by PCR NEGATIVE NEGATIVE Final   Influenza B by PCR NEGATIVE NEGATIVE Final    Comment: (NOTE) The Xpert Xpress SARS-CoV-2/FLU/RSV assay is intended as an aid in  the diagnosis of influenza from Nasopharyngeal swab specimens and  should not be used as a sole basis for treatment. Nasal washings and  aspirates are unacceptable for Xpert Xpress SARS-CoV-2/FLU/RSV  testing.  Fact Sheet for Patients: PinkCheek.be  Fact Sheet for Healthcare Providers: GravelBags.it  This test is not yet approved or cleared by the [place] FDA and  has been authorized for detection and/or diagnosis of SARS-CoV-2 by  FDA under an Emergency Use Authorization (EUA). This EUA will remain  in effect (meaning this test can be used) for the duration of the  Covid-19 declaration under Section  564(b)(1) of the Act, 21  U.S.C. section 360bbb-3(b)(1), unless the authorization is  terminated or revoked. Performed at [facility], [place] [address], [place], [place] [address]   Blood culture (routine single)     Status: None   Collection Time: 08/16/20 10:07 PM   Specimen: BLOOD  Result Value Ref Range Status   Specimen Description   Final    BLOOD LEFT HAND Performed at [facility] [address], [place], [place] [address]    Special Requests   Final    BOTTLES DRAWN AEROBIC ONLY Blood Culture results may not be optimal due to an inadequate volume of blood received in culture bottles Performed at [place] [address], [place], [place] [address]    Culture   Final  NO GROWTH 5 DAYS Performed at [facility], [place] [address] [place], [place] [address]    Report Status 08/22/2020 FINAL  Final  Urine culture     Status: None   Collection Time: 08/17/20  2:52 AM   Specimen: In/Out Cath Urine  Result Value Ref Range Status   Specimen Description   Final    IN/OUT CATH URINE Performed at [place] [address] [place], [place] [address]    Special Requests   Final    NONE Performed at [facility], [place] [address] [place], [place] [address]    Culture   Final    NO GROWTH Performed at [facility], [place] [address] [place], [place] [address]    Report Status 08/18/2020 FINAL  Final  Culture, blood (single)     Status: None   Collection Time: 08/17/20  6:00 AM   Specimen: BLOOD LEFT HAND  Result Value Ref Range Status   Specimen Description   Final    BLOOD LEFT HAND Performed at [place] [address] [place], [place] [address]    Special Requests   Final    BOTTLES DRAWN AEROBIC AND ANAEROBIC Blood Culture adequate volume Performed at [place] [address], [place], [place] [address]     Culture   Final    NO GROWTH 5 DAYS Performed at [facility], [place] [address], [place], [place] [address]    Report Status 08/22/2020 FINAL  Final  Body fluid culture     Status: None   Collection Time: 08/17/20  5:30 PM   Specimen: Gallbladder; Body Fluid  Result Value Ref Range Status   Specimen Description   Final    GALL BLADDER BILE Performed at [facility] [place] [address], [place], [place] [address]    Special Requests   Final    Normal Performed at [facility], [place] [address], [place], [place] [address]    Gram Stain   Final    ABUNDANT WBC PRESENT, PREDOMINANTLY PMN RARE GRAM NEGATIVE RODS Performed at [facility], [place] [address], [place], [place] [address]    Culture RARE KLEBSIELLA PNEUMONIAE  Final   Report Status 08/20/2020 FINAL  Final   Organism ID, Bacteria KLEBSIELLA PNEUMONIAE  Final      Susceptibility   Klebsiella pneumoniae - MIC*    AMPICILLIN RESISTANT Resistant     CEFAZOLIN <=4 SENSITIVE Sensitive     CEFEPIME <=0.12 SENSITIVE Sensitive     CEFTAZIDIME <=1 SENSITIVE Sensitive     CEFTRIAXONE <=0.25 SENSITIVE Sensitive     CIPROFLOXACIN <=0.25 SENSITIVE Sensitive     GENTAMICIN <=1 SENSITIVE Sensitive     IMIPENEM <=0.25 SENSITIVE Sensitive     TRIMETH/SULFA <=20 SENSITIVE Sensitive     AMPICILLIN/SULBACTAM 4 SENSITIVE Sensitive     PIP/TAZO <=4 SENSITIVE Sensitive     * RARE KLEBSIELLA PNEUMONIAE  SARS CORONAVIRUS 2 (TAT 6-24 HRS) Nasopharyngeal Nasopharyngeal Swab     Status: None   Collection Time: 08/20/20  4:57 PM   Specimen: Nasopharyngeal Swab  Result Value Ref Range Status   SARS Coronavirus 2 NEGATIVE NEGATIVE Final    Comment: (NOTE) SARS-CoV-2 target nucleic acids are NOT DETECTED.  The SARS-CoV-2 RNA is generally detectable in upper and lower respiratory specimens during the acute phase of infection. Negative results do not preclude SARS-CoV-2 infection, do not rule  out co-infections with  other pathogens, and should not be used as the sole basis for treatment or other patient management decisions. Negative results must be combined with clinical observations, patient history, and epidemiological information. The expected result is Negative.  Fact Sheet for Patients: SugarRoll.be  Fact Sheet for Healthcare Providers: https://www.woods-mathews.com/  This test is not yet approved or cleared by the [place] FDA and  has been authorized for detection and/or diagnosis of SARS-CoV-2 by FDA under an Emergency Use Authorization (EUA). This EUA will remain  in effect (meaning this test can be used) for the duration of the COVID-19 declaration under Se ction 564(b)(1) of the Act, 21 U.S.C. section 360bbb-3(b)(1), unless the authorization is terminated or revoked sooner.  Performed at [facility], [place] [address] [place], [place] [address]      Labs: BNP (last 3 results) Recent Labs    07/20/20 0629 07/21/20 0700 07/22/20 0328  BNP 1,560.3* 1,382.8* 7,948.0*   Basic Metabolic Panel: Recent Labs  Lab 08/18/20 0905 08/19/20 0436 08/20/20 0244 08/21/20 0403 08/23/20 0350  NA 136 135 136 135 136  K 3.8 3.5 3.3* 3.3* 3.6  CL 108 106 105 104 104  CO2 19* 23 23 25 25   GLUCOSE 52* 89 79 78 84  BUN 7* 5* <5* <5* <5*  CREATININE 0.50 0.40* 0.41* 0.35* 0.42*  CALCIUM 8.2* 8.3* 8.2* 8.4* 8.6*  MG  --  1.8  --   --  1.7  PHOS  --   --   --   --  2.9   Liver Function Tests: Recent Labs  Lab 08/17/20 0051 08/17/20 0051 08/18/20 0905 08/19/20 0436 08/20/20 0244 08/21/20 0403 08/23/20 0350  AST 12*  --  13* 12* 9* 30  --   ALT 10  --  11 10 10 14   --   ALKPHOS 60  --  61 64 56 65  --   BILITOT 1.0  --  1.2 0.6 0.6 0.5  --   PROT 4.7*  --  4.8* 4.8* 4.6* 4.9*  --   ALBUMIN 2.3*   < > 2.1* 2.1* 2.1* 2.2* 2.2*   < > = values in this interval not displayed.   Recent Labs  Lab  08/16/20 1441  LIPASE 16   No results for input(s): AMMONIA in the last 168 hours. CBC: Recent Labs  Lab 08/17/20 0051 08/18/20 0905 08/19/20 0436 08/20/20 0244 08/21/20 0403  WBC 14.3* 8.7 5.9 5.5 5.2  NEUTROABS  --  6.6 3.6 3.1 2.7  HGB 10.2* 9.7* 9.5* 9.4* 10.0*  HCT 32.3* 31.3* 29.9* 30.6* 31.9*  MCV 101.6* 101.6* 99.3 99.7 97.9  PLT 121* 131* 151 158 193   Cardiac Enzymes: No results for input(s): CKTOTAL, CKMB, CKMBINDEX, TROPONINI in the last 168 hours. BNP: Invalid input(s): POCBNP CBG: No results for input(s): GLUCAP in the last 168 hours. D-Dimer No results for input(s): DDIMER in the last 72 hours. Hgb A1c No results for input(s): HGBA1C in the last 72 hours. Lipid Profile No results for input(s): CHOL, HDL, LDLCALC, TRIG, CHOLHDL, LDLDIRECT in the last 72 hours. Thyroid function studies No results for input(s): TSH, T4TOTAL, T3FREE, THYROIDAB in the last 72 hours.  Invalid input(s): FREET3 Anemia work up No results for input(s): VITAMINB12, FOLATE, FERRITIN, TIBC, IRON, RETICCTPCT in the last 72 hours. Urinalysis    Component Value Date/Time   COLORURINE AMBER (A) 08/17/2020 [place] 08/17/2020 0251   LABSPEC 1.026 08/17/2020 0251   PHURINE 6.0 08/17/2020 0251  GLUCOSEU NEGATIVE 08/17/2020 0251   HGBUR NEGATIVE 08/17/2020 0251   BILIRUBINUR SMALL (A) 08/17/2020 0251   KETONESUR NEGATIVE 08/17/2020 0251   PROTEINUR 30 (A) 08/17/2020 0251   UROBILINOGEN 0.2 12/26/2016 1611   NITRITE NEGATIVE 08/17/2020 0251   LEUKOCYTESUR SMALL (A) 08/17/2020 0251   Sepsis Labs Invalid input(s): PROCALCITONIN,  WBC,  LACTICIDVEN   Time coordinating discharge: 35 minutes  SIGNED:  Mercy Riding, MD  [facility] 08/23/2020, 10:41 AM  If 7PM-7AM, please contact night-coverage [email or web address]

## 2020-08-23 NOTE — Progress Notes (Signed)
PerTAra RN, may wait until tomorrow to reaccessed PAC, as pt may d/c in tom.

## 2020-08-23 NOTE — Progress Notes (Signed)
Physical Therapy Treatment Patient Details Name: Lauren Santana MRN: [id number] DOB: 1951-07-30 Today's Date: 08/23/2020    History of Present Illness 69 yo female admitted for Sepsis secondary to possible cholecystitis and s/p perc drain.  Hx of lymphoma, L TKA 2015, PE, T10/T11 fractures    PT Comments    Assisted OOB to [facility] immediately as pt still have uncontrolled loose stools (improving).  General transfer comment: assisted from elevated bed to [facility] thern off BSC to amb.  Pt required increased time and 25% VC's on proper hand transfer and safety with turn completion.  Also pt required assist with peri care as she was inable to safely stand and self perform.General Gait Details: used a [id number] Rollator with seat (what pt uses at home) amb a limited distance due to fatigue.  HR increased to 138 and RA 100%.  Pt tends to lean her forearms on walker as she walks.  "this is how I have to do it".  "I get tired easily". Pt will need ST Rehab at SNF prior to home.  Follow Up Recommendations  SNF     Equipment Recommendations  None recommended by PT    Recommendations for Other Services       Precautions / Restrictions Precautions Precautions: Fall Precaution Comments: R perc drain    Mobility  Bed Mobility Overal bed mobility: Needs Assistance Bed Mobility: Supine to Sit     Supine to sit: HOB elevated;Min guard Sit to supine: Min assist   General bed mobility comments: increased time and effort  Transfers Overall transfer level: Needs assistance Equipment used: Rolling walker (2 wheeled);None Transfers: Sit to/from [employer] to Stand: Min assist Stand pivot transfers: Min assist;Mod assist       General transfer comment: assisted from elevated bed to [facility] thern off BSC to amb.  Pt required increased time and 25% VC's on proper hand transfer and safety with turn completion.  Also pt required assist with peri care as she was inable to safely stand and  self perform.  Ambulation/Gait Ambulation/Gait assistance: Min guard;Min assist Gait Distance (Feet): 50 Feet (25 feet x 2 one seated rest break) Assistive device: 4-wheeled walker Gait Pattern/deviations: Step-through pattern;Decreased stride length;Trunk flexed Gait velocity: decreased   General Gait Details: used a [id number] Rollator with seat (what pt uses at home) amb a limited distance due to fatigue.  HR increased to 138 and RA 100%.  Pt tends to lean her forearms on walker as she walks.  "this is how I have to do it".  "I get tired easily".   Stairs             Wheelchair Mobility    Modified Rankin (Stroke Patients Only)       Balance                                            Cognition Arousal/Alertness: Awake/alert Behavior During Therapy: WFL for tasks assessed/performed Overall Cognitive Status: Within Functional Limits for tasks assessed                                 General Comments: AxO x 3 pleasant      Exercises      General Comments        Pertinent Vitals/Pain Pain Assessment: No/denies pain  Home Living                      Prior Function            PT Goals (current goals can now be found in the care plan section) Progress towards PT goals: Progressing toward goals    Frequency    Min 3X/week      PT Plan Discharge plan needs to be updated    Co-evaluation              AM-PAC PT "6 Clicks" Mobility   Outcome Measure  Help needed turning from your back to your side while in a flat bed without using bedrails?: A Little Help needed moving from lying on your back to sitting on the side of a flat bed without using bedrails?: A Little Help needed moving to and from a bed to a chair (including a wheelchair)?: A Little Help needed standing up from a chair using your arms (e.g., wheelchair or bedside chair)?: A Little Help needed to walk in hospital room?: A Little Help needed  climbing 3-5 steps with a railing? : A Lot 6 Click Score: 17    End of Session Equipment Utilized During Treatment: Gait belt Activity Tolerance: Patient limited by fatigue Patient left: in chair;with call bell/phone within reach Nurse Communication: Mobility status PT Visit Diagnosis: Difficulty in walking, not elsewhere classified (R26.2);Muscle weakness (generalized) (M62.81)     Time: 11:12 - 11:44    Charges:   1 gt    1 ta                     Rica Koyanagi  PTA Acute  Rehabilitation Services Pager      [phone] Office      [phone]

## 2020-08-23 NOTE — Progress Notes (Signed)
[facility] consulted to dose Xarelto for PE.  Previously on Lovenox 1 mg/kg q12 hr per Onc, but OK to transition to DOAC since > 1 month since PE Dx  Will omit initial high-dose phase (15 mg bid) and begin Xarelto 20 mg daily with supper; MD in agreement  Hgb low but stable, Plt stable WNL  SCr stable and at baseline; CrCl > 15 ml/min  BMET    Component Value Date/Time   NA 136 08/23/2020 0350   NA 142 01/03/2019 1352   K 3.6 08/23/2020 0350   CL 104 08/23/2020 0350   CO2 25 08/23/2020 0350   GLUCOSE 84 08/23/2020 0350   BUN <5 (L) 08/23/2020 0350   BUN 8 01/03/2019 1352   CREATININE 0.42 (L) 08/23/2020 0350   CALCIUM 8.6 (L) 08/23/2020 0350   GFRNONAA >60 08/23/2020 0350   GFRAA >60 08/23/2020 0350    CBC    Component Value Date/Time   WBC 5.2 08/21/2020 0403   RBC 3.26 (L) 08/21/2020 0403   HGB 10.0 (L) 08/21/2020 0403   HCT 31.9 (L) 08/21/2020 0403   PLT 193 08/21/2020 0403   MCV 97.9 08/21/2020 0403   MCH 30.7 08/21/2020 0403   MCHC 31.3 08/21/2020 0403   RDW 14.3 08/21/2020 0403   LYMPHSABS 1.4 08/21/2020 0403   MONOABS 0.8 08/21/2020 0403   EOSABS 0.2 08/21/2020 0403   BASOSABS 0.0 08/21/2020 0403    Pharmacy will sign off, following peripherally for dose adjustments. Please reconsult if a change in clinical status warrants re-evaluation of dosage.  Reuel Boom, PharmD, BCPS [phone] 08/23/2020, 10:46 AM

## 2020-08-23 NOTE — Discharge Instructions (Signed)
Information on my medicine - XARELTO (rivaroxaban)  This medication education was reviewed with me or my healthcare representative as part of my discharge preparation.  The pharmacist that spoke with me during my hospital stay was:  Michayla Mcneil A, [place]? Xarelto was prescribed to treat blood clots that may have been found in the veins of your legs (deep vein thrombosis) or in your lungs (pulmonary embolism) and to reduce the risk of them occurring again.  What do you need to know about Xarelto? The dose is one 20 mg tablet taken ONCE A DAY with your evening meal.  DO NOT stop taking Xarelto without talking to the health care provider who prescribed the medication.  Refill your prescription for 20 mg tablets before you run out.  After discharge, you should have regular check-up appointments with your healthcare provider that is prescribing your Xarelto.  In the future your dose may need to be changed if your kidney function changes by a significant amount.  What do you do if you miss a dose? If you are taking Xarelto ONCE DAILY and you miss a dose, take it as soon as you remember on the same day then continue your regularly scheduled once daily regimen the next day. Do not take two doses of Xarelto at the same time.   Important Safety Information Xarelto is a blood thinner medicine that can cause bleeding. You should call your healthcare provider right away if you experience any of the following: ? Bleeding from an injury or your nose that does not stop. ? Unusual colored urine (red or dark brown) or unusual colored stools (red or black). ? Unusual bruising for unknown reasons. ? A serious fall or if you hit your head (even if there is no bleeding).  Some medicines may interact with Xarelto and might increase your risk of bleeding while on Xarelto. To help avoid this, consult your healthcare provider or pharmacist prior to using any new  prescription or non-prescription medications, including herbals, vitamins, non-steroidal anti-inflammatory drugs (NSAIDs) and supplements.  This website has more information on Xarelto: [email or web address]

## 2020-08-23 NOTE — TOC Transition Note (Addendum)
Transition of Care [facility]) - CM/SW Discharge Note   Patient Details  Name: Lauren Santana MRN: [id number] Date of Birth: 07-11-1951  Transition of Care [facility]) CM/SW Contact:  Dessa Phi, RN Phone Number: 08/23/2020, 12:00 PM   Clinical Narrative: Son [name] has started process for appeal to HTA for SNF/PTAR  After p2p outcome of denial.Son also has medicare im notice for appeal-will await start of appeal process.Submitted custodial care benefit to HTA.[facility] following if d/c plan home w/HHC orders already in place;ordered for rw-but patient already has a rw-this is not needed for home.    2p-Appeal process has been initiated for HTA, & medicare-await outcome.Son Keith/patient aware.   Final next level of care: [place] Barriers to Discharge:  (appeal to insurance HTA for denial of peer to peer for SNF/PTAR;Keith will also start appeal for medicare rights of hospital stay-await case#)   Patient Goals and CMS Choice Patient states their goals for this hospitalization and ongoing recovery are:: go to rehab CMS Medicare.gov Compare Post Acute Care list provided to:: Patient Choice offered to / list presented to : Patient  Discharge Placement                       Discharge Plan and Services   Discharge Planning Services: CM Consult                                 Social Determinants of Health (SDOH) Interventions     Readmission Risk Interventions Readmission Risk Prevention Plan 07/23/2020  Transportation Screening Complete  Medication Review (RN CM) Complete  Some recent data might be hidden

## 2020-08-23 NOTE — Care Management Important Message (Signed)
Important Message  Patient Details  Name: STORY CONTI MRN: [id number] Date of Birth: [birth year]   Medicare Important Message Given:  Yes  [employer] Appeal Detailed Notice of Discharge letter created and saved: Yes Detailed Notice of Discharge Document Given to [place]: Yes [employer] ROI Document Created: Yes [employer] appeal documents uploaded to [employer] stite: Yes (confirmation # [id number])  Barb Merino [place] 08/23/2020, 2:05 PM

## 2020-08-23 NOTE — TOC Benefit Eligibility Note (Signed)
Transition of Care [facility]) Benefit Eligibility Note    Patient Details  Name: AMUNIQUE NEYRA MRN: [id number] Date of Birth: 1951/10/06   Medication/Dose: Alveda Reasons 20 MG DAILY  Covered?: Yes  Tier: 3 Drug  Prescription Coverage [facility]erred [facility]  Spoke with Person/Company/Phone Number:: [id number]    @  [employer] # [phone]  Co-Pay: Johnsie Kindred  Prior Approval: No  Deductible: Met  Additional Notes: SECONDARY INS : MEDICAID [place]  : EFF-DATE -06-24-2020  CO-PAY- $4.00 FOR EACH PPRESCRIPTION    Memory Argue Phone Number: 08/23/2020, 11:43 AM

## 2020-08-24 MED ORDER — DICLOFENAC SODIUM 1 % EX GEL
2.0000 g | Freq: Four times a day (QID) | CUTANEOUS | Status: DC
  Administered 2020-08-24 – 2020-08-25 (×5): 2 g via TOPICAL
  Filled 2020-08-24: qty 100

## 2020-08-24 MED ORDER — ACETAMINOPHEN 500 MG PO TABS
500.0000 mg | ORAL_TABLET | Freq: Three times a day (TID) | ORAL | Status: DC
  Administered 2020-08-24 – 2020-08-25 (×4): 500 mg via ORAL
  Filled 2020-08-24 (×4): qty 1

## 2020-08-24 MED ORDER — OXYCODONE-ACETAMINOPHEN 5-325 MG PO TABS
1.0000 | ORAL_TABLET | Freq: Three times a day (TID) | ORAL | Status: DC | PRN
  Administered 2020-08-24 – 2020-08-25 (×2): 1 via ORAL
  Filled 2020-08-24 (×4): qty 1

## 2020-08-24 NOTE — TOC Progression Note (Addendum)
Transition of Care [facility]) - Progression Note    Patient Details  Name: Lauren Santana MRN: [id number] Date of Birth: 12/13/1950  [facility]) CM/SW Contact  Ross Ludwig, [place] Phone Number: 08/24/2020, 12:26 PM  Clinical Narrative:     CSW was informed by previous case manager that patient's son has completed a [employer] and [employer] appeal.  CSW awaiting results of the appeal.  CSW to continue to follow patient's progress throughout discharge planning.  Patient's son is aware that if appeal is unsuccessful, patient will have to return home with home health which has been set up with [facility].  Private caregivers has been set up through [place] per Hannah Beat, case manager.  3:27pm  CSW was informed that patient did not win the Rosalyn Gess appeal for the hospital stay and she will have to be out of hospital tomorrow 08/25/2020 by noon or else they will be responsible for the bill.  Rosalyn Gess has informed patient's son about this as well.   4:30pm  CSW was contacted by [employer] company that SNF appeal is still pending.  Weekend CSW to contact HTA over weekend get update on SNF appeal.   Expected Discharge Plan: [place] Barriers to Discharge:  (appeal to insurance HTA for denial of peer to peer for SNF/PTAR;Keith will also start appeal for medicare rights of hospital stay-await case#)  Expected Discharge Plan and Services Expected Discharge Plan: [place]   Discharge Planning Services: CM Consult   Living arrangements for the past 2 months: Single Family Home Expected Discharge Date: 08/23/20                                     Social Determinants of Health (SDOH) Interventions    Readmission Risk Interventions Readmission Risk Prevention Plan 07/23/2020  Transportation Screening Complete  Medication Review (RN CM) Complete  Some recent data might be hidden

## 2020-08-24 NOTE — Progress Notes (Signed)
Occupational Therapy Treatment Patient Details Name: Lauren Santana MRN: [id number] DOB: [birth year] Today's Date: 08/24/2020    History of present illness 69 yo female admitted for Sepsis secondary to possible cholecystitis and s/p perc drain.  Hx of lymphoma, L TKA 2015, PE, T10/T11 fractures   OT comments  Patient presents supine in bed and reports bowel movement. Patient supervision to transfer to side of bed using bed rail. Patient min guard to transfer to [facility], to stand and perform toileting task and min guard to transfer to recliner with RW. Patient required verbal cues for hand placement and safety. Patient required mod assist for lower body bathing and toileting - to clean self after incontinent bowel movement. Patient able to assist while leaning over and holding onto walker - with therapist providing wash cloths to clean with, management of drain and hospital gown. Patient tolerated task without complaints of fatigue today. Continue to recommend short term rehab at discharge - as patient continues to need assistance with ADLs and exhibiting decreased mobility.   Follow Up Recommendations  SNF;Supervision/Assistance - 24 hour    Equipment Recommendations  None recommended by OT    Recommendations for Other Services      Precautions / Restrictions Precautions Precautions: Fall Precaution Comments: R perc drain Restrictions Weight Bearing Restrictions: No       Mobility Bed Mobility Overal bed mobility: Needs Assistance Bed Mobility: Rolling     Supine to sit: Supervision;HOB elevated     General bed mobility comments: supervision to transfer to side of bed with use of bed rail.  Transfers   Equipment used: Rolling walker (2 wheeled);None Transfers: Sit to/from [employer] to Stand: Min guard Stand pivot transfers: Min guard       General transfer comment: min guard to stand and transfer to [facility] and then transfer to recliner with RW.  verbal cues for technique and hand placement.    Balance Overall balance assessment: Mild deficits observed, not formally tested                                         ADL either performed or assessed with clinical judgement   ADL               Lower Body Bathing: Moderate assistance;Set up;Min guard;Sit to/from stand Lower Body Bathing Details (indicate cue type and reason): Mod assist for LB bathing after BM incontinent episode. Assistance for LEs and back of legs.         Toilet Transfer: Min Forensic psychologist Details (indicate cue type and reason): Min guard to transfer to [facility] Verbal cues for hand placement. Maintained kyphotic posture. Toileting- Clothing Manipulation and Hygiene: Moderate assistance;Sit to/from stand;Min guard Toileting - Clothing Manipulation Details (indicate cue type and reason): Mod assist for toileting - assistance for pericare. Patient able to perform in bent over position holding onto walker to keep herself steady. Therapist assisted with wiping buttocks and quality of task. Patient needing increased time to perform task. min guard provided due to precarious standing position.             Vision       Perception     Praxis      Cognition Arousal/Alertness: Awake/alert Behavior During Therapy: WFL for tasks assessed/performed Overall Cognitive Status: Within Functional Limits for tasks assessed  Exercises     Shoulder Instructions       General Comments      Pertinent Vitals/ Pain       Pain Assessment: No/denies pain  Home Living                                          Prior Functioning/Environment              Frequency  Min 2X/week        Progress Toward Goals  OT Goals(current goals can now be found in the care plan section)  Progress towards OT goals: Progressing toward goals  Acute Rehab OT  Goals Patient Stated Goal: go home and not rehab Time For Goal Achievement: 09/03/20  Plan Discharge plan remains appropriate    Co-evaluation                 AM-PAC OT "6 Clicks" Daily Activity     Outcome Measure   Help from another person eating meals?: None Help from another person taking care of personal grooming?: A Little Help from another person toileting, which includes using toliet, bedpan, or urinal?: A Lot Help from another person bathing (including washing, rinsing, drying)?: A Lot Help from another person to put on and taking off regular upper body clothing?: A Little Help from another person to put on and taking off regular lower body clothing?: A Lot 6 Click Score: 16    End of Session    OT Visit Diagnosis: Unsteadiness on feet (R26.81);Other abnormalities of gait and mobility (R26.89);Muscle weakness (generalized) (M62.81)   Activity Tolerance Patient tolerated treatment well   Patient Left in chair;with call bell/phone within reach;with chair alarm set   Nurse Communication Mobility status        Time: 6004-5997 OT Time Calculation (min): 19 min  Charges: OT General Charges $OT Visit: 1 Visit OT Treatments $Self Care/Home Management : 8-22 mins  Derl Barrow, OTR/L [place]  Office [phone] Pager: [place] 08/24/2020, 1:29 PM

## 2020-08-24 NOTE — Progress Notes (Addendum)
Patient discharged home yesterday (08/23/2020) with home health and DME after insurance denied SNF.  She remained in the hospital as patient's son decided to appeal.  Appeal completed and decision pending.   From medical standpoint, no significant change.  Discharge summary from 08/23/2020 remains in effect  Addendum "3:27pm  CSW was informed that patient did not win the appeal and she will have to be out of hospital tomorrow 08/25/2020 by noon or else they will be responsible for the bill"

## 2020-08-24 NOTE — Progress Notes (Signed)
Referring Physician(s): Nydia Bouton  Supervising Physician: Jacqulynn Cadet  Patient Status:  [facility] - In-pt  Chief Complaint:  Abdominal pain found to be acute calculus  cholecystitis sp cholecystotomy tube placed on 9.24.21 by Dr. Vernia Buff  Subjective:  Patient seen with son at bedside. Patient denies any abdominal pain, nausea or vomitting. Patient and son educated on drain care and follow up.   Allergies: Tylenol [acetaminophen] and Sulfa antibiotics  Medications: Prior to Admission medications   Medication Sig Start Date End Date Taking? Authorizing Provider  atorvastatin (LIPITOR) 10 MG tablet TAKE ONE TABLET BY MOUTH  ONCE DAILY IN THE EVENING Patient taking differently: Take 10 mg by mouth every evening.  03/18/16  Yes Gildardo Cranker, DO  diclofenac Sodium (VOLTAREN) 1 % GEL Apply 2 g topically daily as needed (pain).  08/11/20  Yes [provider]  enoxaparin (LOVENOX) 80 MG/0.8ML injection Inject 0.8 mLs (80 mg total) into the skin every 12 (twelve) hours. 07/25/20  Yes Dwyane Dee, MD  medroxyPROGESTERone (PROVERA) 10 MG tablet Take 10 mg by mouth daily.  01/06/20  Yes [provider]  ondansetron (ZOFRAN) 4 MG tablet Take 1 tablet (4 mg total) by mouth 2 (two) times daily as needed for nausea. 07/25/20  Yes Dwyane Dee, MD  potassium chloride (KLOR-CON) 10 MEQ tablet Take 10 mEq by mouth 2 (two) times daily. 06/20/20  Yes [provider]  sucralfate (CARAFATE) 1 GM/10ML suspension Take 1 g by mouth 3 (three) times daily before meals. 01/06/20  Yes [provider]  acetaminophen (TYLENOL) 500 MG tablet Take 1 tablet (500 mg total) by mouth every 8 (eight) hours. 08/23/20 08/23/21  Mercy Riding, MD  amoxicillin-clavulanate (AUGMENTIN) 875-125 MG tablet Take 1 tablet by mouth every 12 (twelve) hours for 7 days. 08/23/20 08/30/20  Mercy Riding, MD  gabapentin (NEURONTIN) 300 MG capsule Take 1 capsule (300 mg total) by mouth 3 (three) times  daily. 08/21/20   Nita Sells, MD  Lactobacillus (PROBIOTIC ACIDOPHILUS) CAPS Take 1 tablet by mouth in the morning and at bedtime. 08/23/20   Mercy Riding, MD  oxyCODONE (ROXICODONE) 5 MG immediate release tablet Take 1 tablet (5 mg total) by mouth every 8 (eight) hours as needed for severe pain or breakthrough pain. 08/23/20 08/23/21  Mercy Riding, MD  potassium chloride SA (KLOR-CON) 20 MEQ tablet Take 2 tablets (40 mEq total) by mouth daily. 08/22/20   Nita Sells, MD  prochlorperazine (COMPAZINE) 5 MG tablet Take 1 tablet (5 mg total) by mouth every 6 (six) hours as needed for nausea or vomiting. 08/21/20   Nita Sells, MD  rivaroxaban (XARELTO) 20 MG TABS tablet Take 1 tablet (20 mg total) by mouth daily with supper. 08/23/20   Mercy Riding, MD     Vital Signs: BP 110/63 (BP Location: Right Arm)   Pulse 92   Temp 98.5 F (36.9 C) (Oral)   Resp 17   Ht 5' 0.98" (1.549 m)   Wt 172 lb 9.9 oz (78.3 kg)   LMP  (LMP Unknown)   SpO2 97%   BMI 32.63 kg/m   Physical Exam Vitals and nursing note reviewed.  Constitutional:      Appearance: She is well-developed.  HENT:     Head: Normocephalic and atraumatic.  Eyes:     Conjunctiva/sclera: Conjunctivae normal.  Pulmonary:     Effort: Pulmonary effort is normal.  Abdominal:     Comments: Positive RUQ drain  To gravity bag.  site is unremarkable with no erythema, edema, tenderness, bleeding or drainage noted at exit site. Suture and stat lock in place. Dressing is clean dry and intact. 10 ml of  Dark greenish brown colored fluid noted in bulb suction device. Drain is able to be flushed easily.    Musculoskeletal:        General: Normal range of motion.     Cervical back: Normal range of motion.  Skin:    General: Skin is warm.  Neurological:     Mental Status: She is alert and oriented to person, place, and time.     Imaging: No results found.  Labs:  CBC: Recent Labs    08/18/20 0905 08/19/20 0436  08/20/20 0244 08/21/20 0403  WBC 8.7 5.9 5.5 5.2  HGB 9.7* 9.5* 9.4* 10.0*  HCT 31.3* 29.9* 30.6* 31.9*  PLT 131* 151 158 193    COAGS: Recent Labs    08/16/20 2207 08/17/20 0051  INR 1.3* 1.3*  APTT 29  --     BMP: Recent Labs    08/19/20 0436 08/20/20 0244 08/21/20 0403 08/23/20 0350  NA 135 136 135 136  K 3.5 3.3* 3.3* 3.6  CL 106 105 104 104  CO2 23 23 25 25   GLUCOSE 89 79 78 84  BUN 5* <5* <5* <5*  CALCIUM 8.3* 8.2* 8.4* 8.6*  CREATININE 0.40* 0.41* 0.35* 0.42*  GFRNONAA >60 >60 >60 >60  GFRAA >60 >60 >60 >60    LIVER FUNCTION TESTS: Recent Labs    08/18/20 0905 08/18/20 0905 08/19/20 0436 08/20/20 0244 08/21/20 0403 08/23/20 0350  BILITOT 1.2  --  0.6 0.6 0.5  --   AST 13*  --  12* 9* 30  --   ALT 11  --  10 10 14   --   ALKPHOS 61  --  64 56 65  --   PROT 4.8*  --  4.8* 4.6* 4.9*  --   ALBUMIN 2.1*   < > 2.1* 2.1* 2.2* 2.2*   < > = values in this interval not displayed.    Assessment and Plan:  69 y.o. female inpatient. History of large B cell lymphoma, DVT, PE, a fib. Patient presented to the ED at [facility] with abdominal pain, nausea and vomitting found to have acute calculus cholecystitis. Patient is not a surgical candidate. IR placed a cholecystomy tube on 9.24.21. Per Epic output is - 50 ml, 120 ml, 75 ml. 10 ml of dark greenish brown fluid noted to be in the gravity bag. Drain is easily flushed.   Patient is a febrile with no leukocytosis.   if patient is to be discharged, below are discharge instructions: - Flush each drain once daily with 5-10 cc NS flush (patient will need an order for flushes upon discharge). RN aware to teach patient how to manage drains at home. - Record output from each drain once daily. - Follow-up at drain clinic  6- 8  after discharge for CT/possible drain injection (assess for possible drain removal)- order placed to facilitate this.   Electronically Signed: Jacqualine Mau, NP 08/24/2020, 4:12 PM   I spent  a total of 15 Minutes at the the patient's bedside AND on the patient's hospital floor or unit, greater than 50% of which was counseling/coordinating care for cholecystomy tube.

## 2020-08-25 MED ORDER — SALINE FLUSH 0.9 % IV SOLN: 10.0000 mL | Freq: Every day | INTRAVENOUS | 0 refills | Status: AC

## 2020-08-25 MED ORDER — HEPARIN SOD (PORK) LOCK FLUSH 100 UNIT/ML IV SOLN
500.0000 [IU] | INTRAVENOUS | Status: AC | PRN
  Administered 2020-08-25: 500 [IU]
  Filled 2020-08-25: qty 5

## 2020-08-25 NOTE — Progress Notes (Signed)
Pt and son explained and provided discharge instructions. All questions were answered. IV team consulted to deaccess pt port before d/c. Pt and son taught how to empty and flush biliary drain. Pt educated on importance of the flushes. Pt also explained the importance of changing the dressing on the drain. Pt dressed in personal clothing. Pt taken via wheelchair to main entrance.

## 2020-08-25 NOTE — TOC Progression Note (Addendum)
Transition of Care [facility]) - Progression Note    Patient Details  Name: Lauren Santana MRN: [id number] Date of Birth: March 01, 1951  Transition [facility]) CM/SW [place], LCSW Phone Number: 08/25/2020, 9:05 AM  Clinical Narrative:    9:00am CSW reached out to insurance HTA weekend on call. At this a decision has not been made by [employer]  9:16 CSW spoke to [name] and provided an update. He has plan to transport the patient home today at 11:00am. CSW notified the nurse.    Expected Discharge Plan: [place] Barriers to Discharge:  (appeal to insurance HTA for denial of peer to peer for SNF/PTAR;Keith will also start appeal for medicare rights of hospital stay-await case#)  Expected Discharge Plan and Services Expected Discharge Plan: [place]   Discharge Planning Services: CM Consult   Living arrangements for the past 2 months: Single Family Home Expected Discharge Date: 08/23/20                                     Social Determinants of Health (SDOH) Interventions    Readmission Risk Interventions Readmission Risk Prevention Plan 07/23/2020  Transportation Screening Complete  Medication Review (RN CM) Complete  Some recent data might be hidden

## 2020-08-25 NOTE — Progress Notes (Signed)
Patient discharged home yesterday (08/23/2020) with home health and DME after insurance denied SNF.  She remained in the hospital as patient's son decided to appeal.  Appeal completed but patient did not wean appeal. She was discharged home with home health.   From medical standpoint, no significant change.  Discharge summary from 08/23/2020 remains in effect

## 2020-08-28 DIAGNOSIS — K810 Acute cholecystitis: Secondary | ICD-10-CM | POA: Diagnosis not present

## 2020-08-28 DIAGNOSIS — I10 Essential (primary) hypertension: Secondary | ICD-10-CM | POA: Diagnosis not present

## 2020-08-28 DIAGNOSIS — M6281 Muscle weakness (generalized): Secondary | ICD-10-CM | POA: Diagnosis not present

## 2020-08-28 DIAGNOSIS — C8330 Diffuse large B-cell lymphoma, unspecified site: Secondary | ICD-10-CM | POA: Diagnosis not present

## 2020-08-28 DIAGNOSIS — I480 Paroxysmal atrial fibrillation: Secondary | ICD-10-CM | POA: Diagnosis not present

## 2020-08-28 DIAGNOSIS — Z4803 Encounter for change or removal of drains: Secondary | ICD-10-CM | POA: Diagnosis not present

## 2020-08-28 DIAGNOSIS — G894 Chronic pain syndrome: Secondary | ICD-10-CM | POA: Diagnosis not present

## 2020-08-28 DIAGNOSIS — E119 Type 2 diabetes mellitus without complications: Secondary | ICD-10-CM | POA: Diagnosis not present

## 2020-08-31 DIAGNOSIS — K810 Acute cholecystitis: Secondary | ICD-10-CM | POA: Diagnosis not present

## 2020-08-31 MED FILL — NORMAL SALINE FLUSH SYRINGE: 0.9 | 30 days supply | Qty: 300 | Fill #0

## 2020-09-02 NOTE — Progress Notes (Signed)
Cardiology Office Note:    Date:  09/03/2020   ID:  Lauren Santana, DOB [birth year], MRN [id number]  PCP:  Lucianne Lei, MD  Cardiologist:  Minus Breeding, MD   Referring MD: Lucianne Lei, MD   Chief Complaint  Patient presents with  . Follow-up  . Edema    Feet.    History of Present Illness:    Lauren Santana is a 69 y.o. female with a hx of PE/DVT 2017 and tachcyardia treated with xarelto, diffuse large B-cell lymphoma s/p chemo at [facility], who was recently seen while hospitalized 07/19/20 for evaluation of tachycardia with submassive saddle PE and significant RV strain as well as acute left DVT.  She was thrombocytopenic, 2/ b cell lymphoma, and therefore not a candidate for lysis or thrombectomy. She was treated with IV heparin. Echocardiogram with EF 70-75% wit LV hyperdynamic function, no RWMA, grade 1 DD, positive McConnell sign with severe hypokinesis of the mid free wall with sparing/hypercontractile apex, enlarged RV with severely reduced function. She initially presented with sinus tachycardia but converted to Afib RVR on 07/23/20. Cardiology was consulted. Plan for anticoagulation and gentle hydration. Pt converted to sinus tachycardia and PAF expected to resolve as PE resolves. She missed an appt with me on 08/05/20. She was hospitalized shortly thereafter for severe sepsis due to calculus cholecystitis. PERC drain by IR. IV ABX and discharged on 08/23/20 - insurance denied SNF. She was switched to 20 mg xarelto for recurrent PE.   She presents today for scheduled follow up. Her lasix and statin were both discontinued at hospital discharge. She denies increased lower extremity swelling, orthopnea, DOE, SOB, and PND. We discussed daily weights. PERC drain with serous sanguinous bloody fluid. I was able to touch base with IR who said this discharge can be normal for her. They recommended calling IR clinic (number in AVS) if the amount of output significantly increases or there is  bright red blood with blood clots.     Past Medical History:  Diagnosis Date  . Arthritis   . Edema    B/LLE  . Endometriosis   . High cholesterol   . Hypertension   . Numbness    feet  . Pre-diabetes   . Pulmonary embolism (HCC)    recurrent, second episode in 06/2020  . Walker as ambulation aid   . Wears dentures   . Wears glasses     Past Surgical History:  Procedure Laterality Date  . ABDOMINAL HYSTERECTOMY     and BSO  . CATARACT EXTRACTION Left 10/2018  . [place]  . COLONOSCOPY    . EYE SURGERY Bilateral    CATARACT SX  . HERNIA REPAIR  2001   incisional  . IR PERC CHOLECYSTOSTOMY  08/17/2020  . JOINT REPLACEMENT Left    Knee  . MAXILLARY ANTROSTOMY Right 01/20/2020   Procedure: MAXILLARY ANTROSTOMY  WITH BIOPSY CALDWELL APPROACH;  Surgeon: Jerrell Belfast, MD;  Location: [place];  Service: ENT;  Laterality: Right;  . MULTIPLE TOOTH EXTRACTIONS  2011  . NASAL ENDOSCOPY Right 02/17/2020   Procedure: NASAL ENDOSCOPY WITH RIGHT INFERIOR TURNBINATE REDUCTION;  Surgeon: Jerrell Belfast, MD;  Location: [place];  Service: ENT;  Laterality: Right;  . neck mass removal     "fatty tissue, not cancerous" per pt's son  . SINUS ENDO WITH FUSION Right 01/20/2020   Procedure: SINUS ENDO WITH FUSION WITH BIOSPY;  Surgeon: Jerrell Belfast, MD;  Location: [place];  Service: ENT;  Laterality: Right;  . TOTAL KNEE ARTHROPLASTY Left 07/17/2014   Procedure: TOTAL KNEE ARTHROPLASTY;  Surgeon: Kerin Salen, MD;  Location: [place];  Service: Orthopedics;  Laterality: Left;    Current Medications: Current Meds  Medication Sig  . acetaminophen (TYLENOL) 500 MG tablet Take 1 tablet (500 mg total) by mouth every 8 (eight) hours.  . diclofenac Sodium (VOLTAREN) 1 % GEL Apply 2 g topically daily as needed (pain).   Marland Kitchen gabapentin (NEURONTIN) 300 MG capsule Take 1 capsule (300 mg total) by mouth 3 (three) times daily.  . Lactobacillus (PROBIOTIC ACIDOPHILUS) CAPS Take 1 tablet by  mouth in the morning and at bedtime.  . ondansetron (ZOFRAN) 4 MG tablet Take 1 tablet (4 mg total) by mouth 2 (two) times daily as needed for nausea.  Marland Kitchen oxyCODONE (ROXICODONE) 5 MG immediate release tablet Take 1 tablet (5 mg total) by mouth every 8 (eight) hours as needed for severe pain or breakthrough pain.  . potassium chloride SA (KLOR-CON) 20 MEQ tablet Take 2 tablets (40 mEq total) by mouth daily.  . prochlorperazine (COMPAZINE) 5 MG tablet Take 1 tablet (5 mg total) by mouth every 6 (six) hours as needed for nausea or vomiting.  . rivaroxaban (XARELTO) 20 MG TABS tablet Take 1 tablet (20 mg total) by mouth daily with supper.  . Sodium Chloride Flush (SALINE FLUSH) 0.9 % SOLN 10 mLs by Percutaneous route daily.     Allergies:   Tylenol [acetaminophen] and Sulfa antibiotics   Social History   Socioeconomic History  . Marital status: Divorced    Spouse name: Not on file  . Number of children: 2  . Years of education: Not on file  . Highest education level: High school graduate  Occupational History  . Not on file  Tobacco Use  . Smoking status: Never Smoker  . Smokeless tobacco: Never Used  Vaping Use  . Vaping Use: Never used  Substance and Sexual Activity  . Alcohol use: No  . Drug use: No  . Sexual activity: Not Currently    Comment: Hysterectoomy  Other Topics Concern  . Not on file  Social History Narrative   Lives alone    Right handed   Social Determinants of Health   Financial Resource Strain:   . Difficulty of Paying Living Expenses: Not on file  Food Insecurity:   . Worried About Charity fundraiser in the Last Year: Not on file  . Ran Out of Food in the Last Year: Not on file  Transportation Needs:   . Lack of Transportation (Medical): Not on file  . Lack of Transportation (Non-Medical): Not on file  Physical Activity:   . Days of Exercise per Week: Not on file  . Minutes of Exercise per Session: Not on file  Stress:   . Feeling of Stress : Not on  file  Social Connections:   . Frequency of Communication with Friends and Family: Not on file  . Frequency of Social Gatherings with Friends and Family: Not on file  . Attends Religious Services: Not on file  . Active Member of Clubs or Organizations: Not on file  . Attends Archivist Meetings: Not on file  . Marital Status: Not on file     Family History: The patient's family history includes Cancer in her sister; Colon cancer in her sister.  ROS:   Please see the history of present illness.     All other systems reviewed and are negative.  EKGs/Labs/Other Studies Reviewed:    The following studies were reviewed today:  Echo 07/19/20: 1. Left ventricular ejection fraction, by estimation, is 70 to 75%. The  left ventricle has hyperdynamic function. The left ventricle has no  regional wall motion abnormalities. There is mild left ventricular  hypertrophy. Left ventricular diastolic  parameters are consistent with Grade I diastolic dysfunction (impaired  relaxation).  2. Positive Mcconels signs, with severe hypokinesia of the mid free wall  with sparing/hypercontractile apex. The enlarged RV leads to underfilling  of the LV. . Right ventricular systolic function is severely reduced. The  right ventricular size is  severely enlarged. There is moderately elevated pulmonary artery systolic  pressure.  3. Right atrial size was severely dilated.  4. The mitral valve is normal in structure. No evidence of mitral valve  regurgitation. No evidence of mitral stenosis.  5. The aortic valve has an indeterminant number of cusps. Aortic valve  regurgitation is not visualized. No aortic stenosis is present.  6. Moderate pulmonary HTN with PASP 57 mmHg. PASP may underestimate  severity of true pulmonary vascular resistance given severe RV  dysfunciton.  7. The inferior vena cava is dilated in size with <50% respiratory  variability, suggesting right atrial pressure of 15  mmHg.   EKG:  EKG is not ordered today.    Recent Labs: 07/22/2020: B Natriuretic Peptide 1,402.1 08/21/2020: ALT 14; Hemoglobin 10.0; Platelets 193 08/23/2020: BUN <5; Creatinine, Ser 0.42; Magnesium 1.7; Potassium 3.6; Sodium 136  Recent Lipid Panel    Component Value Date/Time   CHOL 129 02/13/2016 0455   TRIG 61 02/13/2016 0455   HDL 48 02/13/2016 0455   CHOLHDL 2.7 02/13/2016 0455   VLDL 12 02/13/2016 0455   LDLCALC 69 02/13/2016 0455    Physical Exam:    VS:  BP 110/60 (BP Location: Left Arm, Patient Position: Sitting, Cuff Size: Normal)   Pulse 88   Ht 5\' 1"  (1.549 m)   Wt 166 lb (75.3 kg)   LMP  (LMP Unknown)   BMI 31.37 kg/m     Wt Readings from Last 3 Encounters:  09/03/20 166 lb (75.3 kg)  08/16/20 172 lb 9.9 oz (78.3 kg)  07/25/20 181 lb 10.5 oz (82.4 kg)     GEN:  Well nourished, well developed in no acute distress HEENT: Normal NECK: No JVD; No carotid bruits LYMPHATICS: No lymphadenopathy CARDIAC: RRR, no murmurs, rubs, gallops RESPIRATORY:  Clear to auscultation without rales, wheezing or rhonchi  ABDOMEN: Soft, non-tender, non-distended, PERC drain with serous-sang bloody fluid MUSCULOSKELETAL:  No edema; No deformity  SKIN: Warm and dry NEUROLOGIC:  Alert and oriented x 3 PSYCHIATRIC:  Normal affect   ASSESSMENT:    1. Acute saddle pulmonary embolism with acute cor pulmonale (HCC)   2. Medication management   3. Acute deep vein thrombosis (DVT) of femoral vein of right lower extremity (HCC)   4. Diastolic dysfunction   5. Atrial fibrillation with RVR (HCC)   6. Edema, unspecified type   7. Hyperlipidemia, unspecified hyperlipidemia type    PLAN:    In order of problems listed above:  Submassive Saddle PE RLE DVT - will now be on life long anticoagulation - now on xarelto - Echo with RV strain and hyperdynamic LV - repeat echo in early Dec   Diastolic dysfunction - lasix stopped at hospital discharge - talked with them about daily  weights and signs and symptoms of volume overload - chronic LE edema - stable for her -  breathing at baseline, no orthopnea, DOE, or PND - will continue to monitor clinically   PAF - EKG today with  - already on anticoagulation - if RVR, can trial BB or CCB - not currently on rate or rhythm control   RV strain due to large PE - will repeat echo in 2 months    Thrombocytopenia B-cell lymphoma - per oncology at [facility] -5/6 cycles on Thursday    Hyperlipidemia - was on statin, D/C'ed at hospital discharge (10 mg lipitor) Lipid panel 08/31/20: Total 134 HDL 58 LDL 57 Trig 100  Follow up with Dr. Percival Spanish after echo in Dec.  Medication Adjustments/Labs and Tests Ordered: Current medicines are reviewed at length with the patient today.  Concerns regarding medicines are outlined above.  Orders Placed This Encounter  Procedures  . CBC  . ECHOCARDIOGRAM COMPLETE   No orders of the defined types were placed in this encounter.   Signed, Ledora Bottcher, PA  09/03/2020 2:57 PM    [facility]

## 2020-09-03 ENCOUNTER — Other Ambulatory Visit: Payer: Self-pay

## 2020-09-03 ENCOUNTER — Ambulatory Visit (INDEPENDENT_AMBULATORY_CARE_PROVIDER_SITE_OTHER): Payer: PPO | Admitting: Physician Assistant

## 2020-09-03 ENCOUNTER — Encounter: Payer: Self-pay | Admitting: Physician Assistant

## 2020-09-03 VITALS — BP 110/60 | HR 88 | Ht 61.0 in | Wt 166.0 lb

## 2020-09-03 DIAGNOSIS — I5189 Other ill-defined heart diseases: Secondary | ICD-10-CM

## 2020-09-03 DIAGNOSIS — E785 Hyperlipidemia, unspecified: Secondary | ICD-10-CM

## 2020-09-03 DIAGNOSIS — I82411 Acute embolism and thrombosis of right femoral vein: Secondary | ICD-10-CM

## 2020-09-03 DIAGNOSIS — R609 Edema, unspecified: Secondary | ICD-10-CM

## 2020-09-03 DIAGNOSIS — I4891 Unspecified atrial fibrillation: Secondary | ICD-10-CM | POA: Diagnosis not present

## 2020-09-03 DIAGNOSIS — Z79899 Other long term (current) drug therapy: Secondary | ICD-10-CM

## 2020-09-03 DIAGNOSIS — I2602 Saddle embolus of pulmonary artery with acute cor pulmonale: Secondary | ICD-10-CM | POA: Diagnosis not present

## 2020-09-03 LAB — CBC
Hematocrit: 36.4 % (ref 34.0–46.6)
Hemoglobin: 11.8 g/dL (ref 11.1–15.9)
MCH: 30.5 pg (ref 26.6–33.0)
MCHC: 32.4 g/dL (ref 31.5–35.7)
MCV: 94 fL (ref 79–97)
Platelets: 194 10*3/uL (ref 150–450)
RBC: 3.87 x10E6/uL (ref 3.77–5.28)
RDW: 12.8 % (ref 11.7–15.4)
WBC: 5.2 10*3/uL (ref 3.4–10.8)

## 2020-09-03 NOTE — Patient Instructions (Signed)
Medication Instructions:  Continue current medications  *If you need a refill on your cardiac medications before your next appointment, please call your pharmacy*   Lab Work: CBC   Testing/Procedures: Your physician has requested that you have an echocardiogram in December. Echocardiography is a painless test that uses sound waves to create images of your heart. It provides your doctor with information about the size and shape of your heart and how well your heart's chambers and valves are working. This procedure takes approximately one hour. There are no restrictions for this procedure.   Follow-Up: At [facility], you and your health needs are our priority.  As part of our continuing mission to provide you with exceptional heart care, we have created designated Provider Care Teams.  These Care Teams include your primary Cardiologist (physician) and Advanced Practice Providers (APPs -  Physician Assistants and Nurse Practitioners) who all work together to provide you with the care you need, when you need it.  We recommend signing up for the patient portal called "MyChart".  Sign up information is provided on this After Visit Summary.  MyChart is used to connect with patients for Virtual Visits (Telemedicine).  Patients are able to view lab/test results, encounter notes, upcoming appointments, etc.  Non-urgent messages can be sent to your provider as well.   To learn more about what you can do with MyChart, go to [email or web address].    Your next appointment:   3 month(s)  The format for your next appointment:   In Person  Provider:   You may see Minus Breeding, MD or one of the following Advanced Practice Providers on your designated Care Team:    Rosaria Ferries, PA-C  Jory Sims, DNP, ANP    Other Instructions [facility] # [phone]

## 2020-09-06 ENCOUNTER — Telehealth: Payer: Self-pay | Admitting: Podiatry

## 2020-09-06 DIAGNOSIS — E538 Deficiency of other specified B group vitamins: Secondary | ICD-10-CM | POA: Diagnosis not present

## 2020-09-06 DIAGNOSIS — Z7901 Long term (current) use of anticoagulants: Secondary | ICD-10-CM | POA: Diagnosis not present

## 2020-09-06 DIAGNOSIS — C8331 Diffuse large B-cell lymphoma, lymph nodes of head, face, and neck: Secondary | ICD-10-CM | POA: Diagnosis not present

## 2020-09-06 DIAGNOSIS — Z978 Presence of other specified devices: Secondary | ICD-10-CM | POA: Diagnosis not present

## 2020-09-06 DIAGNOSIS — K819 Cholecystitis, unspecified: Secondary | ICD-10-CM | POA: Diagnosis not present

## 2020-09-06 DIAGNOSIS — Z9221 Personal history of antineoplastic chemotherapy: Secondary | ICD-10-CM | POA: Diagnosis not present

## 2020-09-06 DIAGNOSIS — A419 Sepsis, unspecified organism: Secondary | ICD-10-CM | POA: Diagnosis not present

## 2020-09-06 DIAGNOSIS — I2699 Other pulmonary embolism without acute cor pulmonale: Secondary | ICD-10-CM | POA: Diagnosis not present

## 2020-09-06 DIAGNOSIS — C8339 Diffuse large B-cell lymphoma, extranodal and solid organ sites: Secondary | ICD-10-CM | POA: Diagnosis not present

## 2020-09-06 NOTE — Telephone Encounter (Signed)
pts son called asking if pts shoes came back in yet. I did not see where they had shipped  So I told him no.  Upon further checking pt is not diabetic therefore insurance will not cover the shoes.  I called pts son back and left a message that insurance will not cover shoes since pt is not diabetic and for him to call me to discuss further. Liliane Channel had left message on 9.28. for pt previously.

## 2020-09-06 NOTE — Telephone Encounter (Signed)
pts son Lauren Santana called me back and is aware that insurance is not going to cover the shoes and inserts and is aware for self pay the cost is 250.00 for 1pr shoes and 1 pr inserts and he said he wants to proceed. I told him I would get them ordered and call him when they come in.

## 2020-09-18 ENCOUNTER — Other Ambulatory Visit: Payer: Self-pay

## 2020-09-18 ENCOUNTER — Ambulatory Visit: Payer: PPO | Admitting: Podiatry

## 2020-09-18 DIAGNOSIS — E1149 Type 2 diabetes mellitus with other diabetic neurological complication: Secondary | ICD-10-CM | POA: Diagnosis not present

## 2020-09-18 DIAGNOSIS — L600 Ingrowing nail: Secondary | ICD-10-CM | POA: Diagnosis not present

## 2020-09-18 MED ORDER — MUPIROCIN 2 % EX OINT: 1.0000 "application " | TOPICAL_OINTMENT | Freq: Two times a day (BID) | CUTANEOUS | 2 refills | Status: DC

## 2020-09-18 NOTE — Patient Instructions (Signed)
Soak Instructions- HAVE SOMEONE ELSE CHECK THE TEMPERATURE OF THE WATER AND MAKE SURE YOU DRY WELL AFTER SOAKING    THE DAY AFTER THE PROCEDURE  Place 1/4 cup of epsom salts in a quart of warm tap water.  Submerge your foot or feet with outer bandage intact for the initial soak; this will allow the bandage to become moist and wet for easy lift off.  Once you remove your bandage, continue to soak in the solution for 20 minutes.  This soak should be done twice a day.  Next, remove your foot or feet from solution, blot dry the affected area and cover.  You may use a band aid large enough to cover the area or use gauze and tape.  Apply other medications to the area as directed by the doctor such as polysporin neosporin.  IF YOUR SKIN BECOMES IRRITATED WHILE USING THESE INSTRUCTIONS, IT IS OKAY TO SWITCH TO  WHITE VINEGAR AND WATER. Or you may use antibacterial soap and water to keep the toe clean  Monitor for any signs/symptoms of infection. Call the office immediately if any occur or go directly to the emergency room. Call with any questions/concerns.

## 2020-09-19 DIAGNOSIS — E785 Hyperlipidemia, unspecified: Secondary | ICD-10-CM | POA: Diagnosis not present

## 2020-09-19 DIAGNOSIS — M159 Polyosteoarthritis, unspecified: Secondary | ICD-10-CM | POA: Diagnosis not present

## 2020-09-19 DIAGNOSIS — E1169 Type 2 diabetes mellitus with other specified complication: Secondary | ICD-10-CM | POA: Diagnosis not present

## 2020-09-19 DIAGNOSIS — I10 Essential (primary) hypertension: Secondary | ICD-10-CM | POA: Diagnosis not present

## 2020-09-20 DIAGNOSIS — Z79899 Other long term (current) drug therapy: Secondary | ICD-10-CM | POA: Diagnosis not present

## 2020-09-20 DIAGNOSIS — M6281 Muscle weakness (generalized): Secondary | ICD-10-CM | POA: Diagnosis not present

## 2020-09-20 DIAGNOSIS — C8330 Diffuse large B-cell lymphoma, unspecified site: Secondary | ICD-10-CM | POA: Diagnosis not present

## 2020-09-20 DIAGNOSIS — K819 Cholecystitis, unspecified: Secondary | ICD-10-CM | POA: Diagnosis not present

## 2020-09-20 DIAGNOSIS — Z86718 Personal history of other venous thrombosis and embolism: Secondary | ICD-10-CM | POA: Diagnosis not present

## 2020-09-20 DIAGNOSIS — E538 Deficiency of other specified B group vitamins: Secondary | ICD-10-CM | POA: Diagnosis not present

## 2020-09-20 DIAGNOSIS — Z7901 Long term (current) use of anticoagulants: Secondary | ICD-10-CM | POA: Diagnosis not present

## 2020-09-20 DIAGNOSIS — Z5111 Encounter for antineoplastic chemotherapy: Secondary | ICD-10-CM | POA: Diagnosis not present

## 2020-09-20 DIAGNOSIS — Z7401 Bed confinement status: Secondary | ICD-10-CM | POA: Diagnosis not present

## 2020-09-20 DIAGNOSIS — Z8719 Personal history of other diseases of the digestive system: Secondary | ICD-10-CM | POA: Diagnosis not present

## 2020-09-20 DIAGNOSIS — S22088D Other fracture of T11-T12 vertebra, subsequent encounter for fracture with routine healing: Secondary | ICD-10-CM | POA: Diagnosis not present

## 2020-09-22 NOTE — Progress Notes (Signed)
Subjective: 69 year old female presents the office today for evaluation of possible ingrown toenail right big toe, medial aspect.  She states the area has been painful but denies any drainage or pus or any red streaks.  She said no recent treatment.  She has no other concerns today. Denies any systemic complaints such as fevers, chills, nausea, vomiting. No acute changes since last appointment, and no other complaints at this time.   Objective: NAD-presents today with a caregiver DP/PT pulses palpable bilaterally, CRT less than 3 seconds Incurvation present to medial aspect of right hallux toenail with minimal edema.  Tenderness at the distal medial aspect of the nail border.  There is no drainage or pus or ascending cellulitis there is no open lesions. No pain with calf compression, swelling, warmth, erythema  Assessment: Ingrown toenail right medial border  Plan: -All treatment options discussed with the patient including all alternatives, risks, complications.  -Assessment partial nail avulsion but we decided to hold off on this.  Is able to sharply debride the symptomatic portion of ingrown toenail with any complications.  Recommend antibiotic ointment daily.  Discussed with the caregiver soaking the foot to make sure the caregiver is checking the temperature of the water.  Monitor for any signs or symptoms of infection.  If it is not improved or if there is any worsening to let me know otherwise I will see her back as scheduled for her routine debridement appointment. -Patient encouraged to call the office with any questions, concerns, change in symptoms.   Trula Slade DPM

## 2020-09-24 DIAGNOSIS — G894 Chronic pain syndrome: Secondary | ICD-10-CM | POA: Diagnosis not present

## 2020-09-24 DIAGNOSIS — Z4803 Encounter for change or removal of drains: Secondary | ICD-10-CM | POA: Diagnosis not present

## 2020-09-24 DIAGNOSIS — K810 Acute cholecystitis: Secondary | ICD-10-CM | POA: Diagnosis not present

## 2020-09-24 DIAGNOSIS — C8330 Diffuse large B-cell lymphoma, unspecified site: Secondary | ICD-10-CM | POA: Diagnosis not present

## 2020-09-24 DIAGNOSIS — E119 Type 2 diabetes mellitus without complications: Secondary | ICD-10-CM | POA: Diagnosis not present

## 2020-09-24 DIAGNOSIS — I10 Essential (primary) hypertension: Secondary | ICD-10-CM | POA: Diagnosis not present

## 2020-09-24 DIAGNOSIS — I480 Paroxysmal atrial fibrillation: Secondary | ICD-10-CM | POA: Diagnosis not present

## 2020-09-24 DIAGNOSIS — M6281 Muscle weakness (generalized): Secondary | ICD-10-CM | POA: Diagnosis not present

## 2020-09-26 DIAGNOSIS — K810 Acute cholecystitis: Secondary | ICD-10-CM | POA: Diagnosis not present

## 2020-09-28 ENCOUNTER — Other Ambulatory Visit: Payer: Self-pay

## 2020-09-28 ENCOUNTER — Ambulatory Visit (HOSPITAL_COMMUNITY)
Admission: RE | Admit: 2020-09-28 | Discharge: 2020-09-28 | Disposition: A | Discharge status: Home / Self Care | Payer: PPO | Source: Ambulatory Visit | Attending: Radiology | Admitting: Radiology

## 2020-09-28 DIAGNOSIS — K810 Acute cholecystitis: Secondary | ICD-10-CM | POA: Insufficient documentation

## 2020-09-28 DIAGNOSIS — Z434 Encounter for attention to other artificial openings of digestive tract: Secondary | ICD-10-CM | POA: Diagnosis not present

## 2020-09-28 HISTORY — PX: IR CHOLANGIOGRAM EXISTING TUBE: IMG6040

## 2020-09-28 MED ORDER — IOHEXOL 300 MG/ML  SOLN
50.0000 mL | Freq: Once | INTRAMUSCULAR | Status: AC | PRN
  Administered 2020-09-28: 10 mL

## 2020-10-01 ENCOUNTER — Other Ambulatory Visit (HOSPITAL_COMMUNITY): Payer: Self-pay | Admitting: Interventional Radiology

## 2020-10-01 DIAGNOSIS — K810 Acute cholecystitis: Secondary | ICD-10-CM | POA: Diagnosis not present

## 2020-10-01 DIAGNOSIS — K8000 Calculus of gallbladder with acute cholecystitis without obstruction: Secondary | ICD-10-CM

## 2020-10-04 DIAGNOSIS — D509 Iron deficiency anemia, unspecified: Secondary | ICD-10-CM | POA: Diagnosis not present

## 2020-10-04 DIAGNOSIS — D496 Neoplasm of unspecified behavior of brain: Secondary | ICD-10-CM | POA: Diagnosis not present

## 2020-10-04 DIAGNOSIS — C8330 Diffuse large B-cell lymphoma, unspecified site: Secondary | ICD-10-CM | POA: Diagnosis not present

## 2020-10-08 DIAGNOSIS — K810 Acute cholecystitis: Secondary | ICD-10-CM | POA: Diagnosis not present

## 2020-10-08 DIAGNOSIS — C8330 Diffuse large B-cell lymphoma, unspecified site: Secondary | ICD-10-CM | POA: Diagnosis not present

## 2020-10-11 DIAGNOSIS — C8330 Diffuse large B-cell lymphoma, unspecified site: Secondary | ICD-10-CM | POA: Diagnosis not present

## 2020-10-11 DIAGNOSIS — Z5111 Encounter for antineoplastic chemotherapy: Secondary | ICD-10-CM | POA: Diagnosis not present

## 2020-10-11 DIAGNOSIS — Z79899 Other long term (current) drug therapy: Secondary | ICD-10-CM | POA: Diagnosis not present

## 2020-10-11 DIAGNOSIS — K819 Cholecystitis, unspecified: Secondary | ICD-10-CM | POA: Diagnosis not present

## 2020-10-11 DIAGNOSIS — C8331 Diffuse large B-cell lymphoma, lymph nodes of head, face, and neck: Secondary | ICD-10-CM | POA: Diagnosis not present

## 2020-10-11 DIAGNOSIS — E538 Deficiency of other specified B group vitamins: Secondary | ICD-10-CM | POA: Diagnosis not present

## 2020-10-11 DIAGNOSIS — E611 Iron deficiency: Secondary | ICD-10-CM | POA: Diagnosis not present

## 2020-10-12 ENCOUNTER — Ambulatory Visit (HOSPITAL_COMMUNITY)
Admission: RE | Admit: 2020-10-12 | Discharge: 2020-10-12 | Disposition: A | Discharge status: Home / Self Care | Payer: PPO | Source: Ambulatory Visit | Attending: Interventional Radiology | Admitting: Interventional Radiology

## 2020-10-12 ENCOUNTER — Other Ambulatory Visit: Payer: Self-pay

## 2020-10-12 DIAGNOSIS — Z434 Encounter for attention to other artificial openings of digestive tract: Secondary | ICD-10-CM | POA: Insufficient documentation

## 2020-10-12 DIAGNOSIS — K831 Obstruction of bile duct: Secondary | ICD-10-CM | POA: Diagnosis not present

## 2020-10-12 DIAGNOSIS — K8000 Calculus of gallbladder with acute cholecystitis without obstruction: Secondary | ICD-10-CM | POA: Diagnosis not present

## 2020-10-12 HISTORY — PX: IR CATHETER TUBE CHANGE: IMG717

## 2020-10-12 MED ORDER — IOHEXOL 300 MG/ML  SOLN
50.0000 mL | Freq: Once | INTRAMUSCULAR | Status: AC | PRN
  Administered 2020-10-12: 10 mL

## 2020-10-12 MED ORDER — LIDOCAINE HCL 1 % IJ SOLN
INTRAMUSCULAR | Status: AC
  Filled 2020-10-12: qty 20

## 2020-10-15 ENCOUNTER — Other Ambulatory Visit (HOSPITAL_COMMUNITY): Payer: Self-pay | Admitting: Interventional Radiology

## 2020-10-15 DIAGNOSIS — K810 Acute cholecystitis: Secondary | ICD-10-CM

## 2020-10-21 DIAGNOSIS — M6281 Muscle weakness (generalized): Secondary | ICD-10-CM | POA: Diagnosis not present

## 2020-10-21 DIAGNOSIS — S22088D Other fracture of T11-T12 vertebra, subsequent encounter for fracture with routine healing: Secondary | ICD-10-CM | POA: Diagnosis not present

## 2020-10-22 DIAGNOSIS — E661 Drug-induced obesity: Secondary | ICD-10-CM | POA: Diagnosis not present

## 2020-10-22 DIAGNOSIS — F064 Anxiety disorder due to known physiological condition: Secondary | ICD-10-CM | POA: Diagnosis not present

## 2020-10-22 DIAGNOSIS — I10 Essential (primary) hypertension: Secondary | ICD-10-CM | POA: Diagnosis not present

## 2020-10-22 DIAGNOSIS — G894 Chronic pain syndrome: Secondary | ICD-10-CM | POA: Diagnosis not present

## 2020-10-23 ENCOUNTER — Other Ambulatory Visit: Payer: Self-pay

## 2020-10-23 ENCOUNTER — Ambulatory Visit: Payer: PPO | Admitting: Orthotics

## 2020-10-23 DIAGNOSIS — M159 Polyosteoarthritis, unspecified: Secondary | ICD-10-CM | POA: Diagnosis not present

## 2020-10-23 DIAGNOSIS — H04123 Dry eye syndrome of bilateral lacrimal glands: Secondary | ICD-10-CM | POA: Diagnosis not present

## 2020-10-23 DIAGNOSIS — A419 Sepsis, unspecified organism: Secondary | ICD-10-CM

## 2020-10-23 DIAGNOSIS — R609 Edema, unspecified: Secondary | ICD-10-CM

## 2020-10-23 DIAGNOSIS — R6520 Severe sepsis without septic shock: Secondary | ICD-10-CM

## 2020-10-23 DIAGNOSIS — I10 Essential (primary) hypertension: Secondary | ICD-10-CM | POA: Diagnosis not present

## 2020-10-23 DIAGNOSIS — E1169 Type 2 diabetes mellitus with other specified complication: Secondary | ICD-10-CM | POA: Diagnosis not present

## 2020-10-23 DIAGNOSIS — E1149 Type 2 diabetes mellitus with other diabetic neurological complication: Secondary | ICD-10-CM

## 2020-10-23 DIAGNOSIS — M722 Plantar fascial fibromatosis: Secondary | ICD-10-CM

## 2020-10-23 DIAGNOSIS — E785 Hyperlipidemia, unspecified: Secondary | ICD-10-CM | POA: Diagnosis not present

## 2020-10-23 NOTE — Progress Notes (Signed)
Had to reorder shoe, she needs xxw due to edema.Marland KitchenMarland KitchenOrderd he Ortho 825 stretch.

## 2020-10-24 ENCOUNTER — Encounter (HOSPITAL_COMMUNITY): Payer: Self-pay | Admitting: Cardiology

## 2020-10-24 ENCOUNTER — Encounter (HOSPITAL_COMMUNITY): Payer: Self-pay | Admitting: Physician Assistant

## 2020-10-24 ENCOUNTER — Other Ambulatory Visit (HOSPITAL_COMMUNITY): Payer: PPO

## 2020-10-24 DIAGNOSIS — Z86711 Personal history of pulmonary embolism: Secondary | ICD-10-CM | POA: Diagnosis not present

## 2020-10-24 DIAGNOSIS — K819 Cholecystitis, unspecified: Secondary | ICD-10-CM | POA: Diagnosis not present

## 2020-10-24 DIAGNOSIS — Z7901 Long term (current) use of anticoagulants: Secondary | ICD-10-CM | POA: Diagnosis not present

## 2020-10-24 DIAGNOSIS — C8510 Unspecified B-cell lymphoma, unspecified site: Secondary | ICD-10-CM | POA: Diagnosis not present

## 2020-10-24 NOTE — Progress Notes (Unsigned)
Patient ID: Lauren Santana, female   DOB: 09-07-1951, 69 y.o.   MRN: [id number]   Verified appointment "no show" status with Meeka at 10:31am.

## 2020-10-26 DIAGNOSIS — C8330 Diffuse large B-cell lymphoma, unspecified site: Secondary | ICD-10-CM | POA: Diagnosis not present

## 2020-10-26 DIAGNOSIS — M6281 Muscle weakness (generalized): Secondary | ICD-10-CM | POA: Diagnosis not present

## 2020-10-26 DIAGNOSIS — E119 Type 2 diabetes mellitus without complications: Secondary | ICD-10-CM | POA: Diagnosis not present

## 2020-10-26 DIAGNOSIS — I480 Paroxysmal atrial fibrillation: Secondary | ICD-10-CM | POA: Diagnosis not present

## 2020-10-26 DIAGNOSIS — G894 Chronic pain syndrome: Secondary | ICD-10-CM | POA: Diagnosis not present

## 2020-10-26 DIAGNOSIS — I10 Essential (primary) hypertension: Secondary | ICD-10-CM | POA: Diagnosis not present

## 2020-10-29 MED FILL — NORMAL SALINE FLUSH SYRINGE: 0.9 | 30 days supply | Qty: 300 | Fill #1

## 2020-11-05 DIAGNOSIS — S22089D Unspecified fracture of T11-T12 vertebra, subsequent encounter for fracture with routine healing: Secondary | ICD-10-CM | POA: Diagnosis not present

## 2020-11-05 DIAGNOSIS — R2681 Unsteadiness on feet: Secondary | ICD-10-CM | POA: Diagnosis not present

## 2020-11-07 DIAGNOSIS — N809 Endometriosis, unspecified: Secondary | ICD-10-CM | POA: Diagnosis not present

## 2020-11-07 DIAGNOSIS — Z780 Asymptomatic menopausal state: Secondary | ICD-10-CM | POA: Diagnosis not present

## 2020-11-07 DIAGNOSIS — N804 Endometriosis of rectovaginal septum and vagina: Secondary | ICD-10-CM | POA: Diagnosis not present

## 2020-11-13 ENCOUNTER — Ambulatory Visit: Payer: PPO | Admitting: Orthotics

## 2020-11-13 ENCOUNTER — Other Ambulatory Visit: Payer: Self-pay

## 2020-11-13 ENCOUNTER — Ambulatory Visit (INDEPENDENT_AMBULATORY_CARE_PROVIDER_SITE_OTHER): Payer: PPO | Admitting: Podiatry

## 2020-11-13 DIAGNOSIS — L600 Ingrowing nail: Secondary | ICD-10-CM | POA: Diagnosis not present

## 2020-11-13 DIAGNOSIS — B351 Tinea unguium: Secondary | ICD-10-CM

## 2020-11-13 DIAGNOSIS — M722 Plantar fascial fibromatosis: Secondary | ICD-10-CM

## 2020-11-13 DIAGNOSIS — E1149 Type 2 diabetes mellitus with other diabetic neurological complication: Secondary | ICD-10-CM

## 2020-11-13 NOTE — Progress Notes (Signed)
Need to get larger shoe.

## 2020-11-14 NOTE — Progress Notes (Signed)
Subjective: 69 y.o. returns the office today for painful, elongated, thickened toenails which she cannot trim herself. Denies any redness or drainage around the nails. Denies any acute changes since last appointment and no new complaints today. Denies any systemic complaints such as fevers, chills, nausea, vomiting.   PCP: Lucianne Lei, MD  Objective: AAO 3, NAD DP/PT pulses palpable, CRT less than 3 seconds Nails hypertrophic, dystrophic, elongated, brittle, discolored 10. There is tenderness overlying the nails 1-5 bilaterally. There is no surrounding erythema or drainage along the nail sites.  Chronic incurvation present of the toenails without signs of infection. No open lesions or pre-ulcerative lesions are identified. No other areas of tenderness bilateral lower extremities. No overlying edema, erythema, increased warmth. No pain with calf compression, swelling, warmth, erythema.  Assessment: Patient presents with symptomatic onychomycosis  Plan: -Treatment options including alternatives, risks, complications were discussed -Nails sharply debrided 10 without complication/bleeding. -Discussed daily foot inspection. If there are any changes, to call the office immediately.  -Follow-up in 3 months or sooner if any problems are to arise. In the meantime, encouraged to call the office with any questions, concerns, changes symptoms.  Celesta Gentile, DPM

## 2020-11-19 DIAGNOSIS — C8338 Diffuse large B-cell lymphoma, lymph nodes of multiple sites: Secondary | ICD-10-CM | POA: Diagnosis not present

## 2020-11-19 DIAGNOSIS — Z9889 Other specified postprocedural states: Secondary | ICD-10-CM | POA: Diagnosis not present

## 2020-11-19 DIAGNOSIS — R930 Abnormal findings on diagnostic imaging of skull and head, not elsewhere classified: Secondary | ICD-10-CM | POA: Diagnosis not present

## 2020-11-19 DIAGNOSIS — K119 Disease of salivary gland, unspecified: Secondary | ICD-10-CM | POA: Diagnosis not present

## 2020-11-20 DIAGNOSIS — S22088D Other fracture of T11-T12 vertebra, subsequent encounter for fracture with routine healing: Secondary | ICD-10-CM | POA: Diagnosis not present

## 2020-11-20 DIAGNOSIS — M6281 Muscle weakness (generalized): Secondary | ICD-10-CM | POA: Diagnosis not present

## 2020-11-22 ENCOUNTER — Other Ambulatory Visit (HOSPITAL_COMMUNITY): Payer: Self-pay | Admitting: Family Medicine

## 2020-11-22 ENCOUNTER — Ambulatory Visit (HOSPITAL_COMMUNITY)
Admission: RE | Admit: 2020-11-22 | Discharge: 2020-11-22 | Disposition: A | Discharge status: Home / Self Care | Payer: PPO | Source: Ambulatory Visit | Attending: Family Medicine | Admitting: Family Medicine

## 2020-11-22 ENCOUNTER — Other Ambulatory Visit: Payer: Self-pay

## 2020-11-22 DIAGNOSIS — Z934 Other artificial openings of gastrointestinal tract status: Secondary | ICD-10-CM | POA: Diagnosis not present

## 2020-11-22 DIAGNOSIS — Z978 Presence of other specified devices: Secondary | ICD-10-CM | POA: Diagnosis not present

## 2020-11-22 DIAGNOSIS — T85598A Other mechanical complication of other gastrointestinal prosthetic devices, implants and grafts, initial encounter: Secondary | ICD-10-CM | POA: Diagnosis not present

## 2020-11-22 DIAGNOSIS — E611 Iron deficiency: Secondary | ICD-10-CM | POA: Diagnosis not present

## 2020-11-22 DIAGNOSIS — K819 Cholecystitis, unspecified: Secondary | ICD-10-CM | POA: Diagnosis not present

## 2020-11-22 DIAGNOSIS — K810 Acute cholecystitis: Secondary | ICD-10-CM | POA: Diagnosis not present

## 2020-11-22 DIAGNOSIS — E538 Deficiency of other specified B group vitamins: Secondary | ICD-10-CM | POA: Diagnosis not present

## 2020-11-22 DIAGNOSIS — Z95828 Presence of other vascular implants and grafts: Secondary | ICD-10-CM | POA: Diagnosis not present

## 2020-11-22 DIAGNOSIS — C8331 Diffuse large B-cell lymphoma, lymph nodes of head, face, and neck: Secondary | ICD-10-CM | POA: Diagnosis not present

## 2020-11-22 DIAGNOSIS — D508 Other iron deficiency anemias: Secondary | ICD-10-CM | POA: Diagnosis not present

## 2020-11-22 HISTORY — PX: IR RADIOLOGIST EVAL & MGMT: IMG5224

## 2020-11-22 NOTE — Progress Notes (Signed)
Patient presented to IR today (with her son, [name]) for evaluation of her percutaneous cholecystostomy drain which was placed 08/17/20 and last exchanged 10/12/20.   The drain was easily flushed with 10 ml NS and was easily aspirated. The stat lock had dried drainage on it and this was removed. The suture is in place. The skin insertion site demonstrated mild skin break down, possibly related to some bile leakage from around the catheter. No current leaking observed around the site. The site does not look infected.   The site was gently cleaned with NS and split gauze was applied over the site. A new gravity bag was attached. A new stat-lock was not applied due to concern for skin breakdown/skin sensitivity. The patient was given more NS flushes, split gauze and tape. She has a routine exchange scheduled for 12/07/20.  The patient and her son know they can call IR with any additional questions or concerns.   Alwyn Ren, [place] [phone] 11/22/2020, 3:16 PM

## 2020-11-29 ENCOUNTER — Ambulatory Visit (HOSPITAL_COMMUNITY): Payer: Medicare Other | Attending: Internal Medicine

## 2020-11-29 ENCOUNTER — Other Ambulatory Visit: Payer: Self-pay

## 2020-11-29 DIAGNOSIS — I2602 Saddle embolus of pulmonary artery with acute cor pulmonale: Secondary | ICD-10-CM | POA: Insufficient documentation

## 2020-11-29 LAB — ECHOCARDIOGRAM COMPLETE: S' Lateral: 2.5 cm

## 2020-12-03 DIAGNOSIS — I10 Essential (primary) hypertension: Secondary | ICD-10-CM | POA: Diagnosis not present

## 2020-12-03 DIAGNOSIS — Z0000 Encounter for general adult medical examination without abnormal findings: Secondary | ICD-10-CM | POA: Diagnosis not present

## 2020-12-03 DIAGNOSIS — K8001 Calculus of gallbladder with acute cholecystitis with obstruction: Secondary | ICD-10-CM | POA: Diagnosis not present

## 2020-12-06 DIAGNOSIS — D508 Other iron deficiency anemias: Secondary | ICD-10-CM | POA: Diagnosis not present

## 2020-12-07 ENCOUNTER — Ambulatory Visit (HOSPITAL_COMMUNITY)
Admission: RE | Admit: 2020-12-07 | Discharge: 2020-12-07 | Disposition: A | Discharge status: Home / Self Care | Payer: Medicare Other | Source: Ambulatory Visit | Attending: Interventional Radiology | Admitting: Interventional Radiology

## 2020-12-07 ENCOUNTER — Other Ambulatory Visit: Payer: Self-pay

## 2020-12-07 DIAGNOSIS — Z434 Encounter for attention to other artificial openings of digestive tract: Secondary | ICD-10-CM | POA: Diagnosis not present

## 2020-12-07 DIAGNOSIS — K810 Acute cholecystitis: Secondary | ICD-10-CM | POA: Diagnosis not present

## 2020-12-07 HISTORY — PX: IR CATHETER TUBE CHANGE: IMG717

## 2020-12-07 MED ORDER — IOHEXOL 300 MG/ML  SOLN
50.0000 mL | Freq: Once | INTRAMUSCULAR | Status: AC | PRN
  Administered 2020-12-07: 5 mL

## 2020-12-07 MED ORDER — LIDOCAINE HCL 1 % IJ SOLN
INTRAMUSCULAR | Status: AC | PRN
Start: 2020-12-07 — End: 2020-12-07
  Administered 2020-12-07: 5 mL

## 2020-12-07 MED ORDER — LIDOCAINE HCL 1 % IJ SOLN
INTRAMUSCULAR | Status: AC
  Filled 2020-12-07: qty 20

## 2020-12-07 NOTE — Progress Notes (Deleted)
Cardiology Office Note:    Date:  12/07/2020   ID:  Lauren Santana, DOB 07-08-1951, MRN [id number]  PCP:  Lauren Lei, MD  Cardiologist:  Minus Breeding, MD  Electrophysiologist:  None   Referring MD: Lauren Lei, MD   Chief Complaint: follow-up of submassive saddle PE with RV strain  History of Present Illness:    Lauren Santana is a 70 y.o. 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 Percival Spanish and presents today for follow-up of PE.  Patient was admitted in 01/2016 for acute PE with right heart strain after presenting with exertional dyspnea and chest pain. She was also found to have a right lower extremity DVT at that time. Echo at that time showed LVEF of 65-70% with normal wall motion. RV was mildly dilated with moderately reduced systolic function. She was started on anticoagulation with Xarelto. She was noted to have atrial tachycardia during this admission and was evaluated by Cardiology. Home beta-blocker was continued and patient ultimately converted back to sinus rhythm.   Patient was not seen by Cardiology again until 06/2020 when she was again admitted acute submassive saddle PE with severe right heart strain. Also found to have left lower extremity DVT. 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 Therefore, she was started on IV Heparin. Echo showed LVEF of 70-75% with no regional wall motion and grade 1 diastolic dysfunction. Also noted positive McConnel sign with severe hypokinesis of the mid free wall with sparing/hypercontractile apex. RV was severely enlarged with severely  reduced systolic function as well as moderate pulmonary hypertension with PAPS of 57 mmHg. She did develop paroxysmal atrial fibrillation during admission. She received gentle hydration with IV fluids and ultimately converted to sinus tachycardia. This was felt to be due to stress from PE. She was ultimately transitioned to Xarelto prior to discharge. Home Lasix and Labetalol were discontinued to avoid decrease in preload with acute PE.   Patient was admitted in 07/2020 with severe sepsis secondary to calculus cholecystitis. Surgery was consulted and recommended PERC drain placement and IV antibiotics. Patient underwent PERC drain placement by IR on 08/17/2020. PT recommend SNF but insurance declined this stating that patient was at her baseline.  Patient was last seen by Doreene Adas, PA-C, in 08/2020 for follow-up at which time she was doing well from a cardiac standpoint. Repeat Echo was done on 11/29/2020 which showed normal LV function and significant improved in RV function. RV only mildly enlarged with normal systolic function and normal PASP.   Patient presents today for follow-up. ***  Recurrent PE/DVT (01/2016 and 06/2020) Recent Submassive Saddle PE with Severe Right Heart Strain Recent Right Lower Extremity DVT - Admitted in 06/2020 with acute submassive PE with severe right heart strain and right lower extremity DVT.  - Recent Echo on 11/29/2020 showed significant improvement in RV function (see above).  - *** - Will now be on life-long anticoagulation. Continue Xarelto 20mg  daily.  Paroxysmal Atrial Fibrillation - Developed atrial fibrillation in setting of submassive PE but converted back to sinus rhythm spontaneously. - *** - Continue Xarelto as above.  Mild Diastolic Dysfunction Chronic Lower Extremity Edema - Lasix was stopped  during admission in 06/2020 to avoid decrease in preload in setting of acute PE. - ***  Hyperlipidemia - Previously on Lipitor 10mg  daily but this was  discontinue during recent admission in 07/2020 as the indication for statin was reportedly unclear. - Lipid panel in 08/2020: Total Cholesterol 134, Triglycerides 100, HDL 58, LDL 57. *** - Can be followed by PCP. 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   Past Medical History:  Diagnosis Date  . Arthritis   . Edema    B/LLE  . Endometriosis   . High cholesterol   . Hypertension   . Numbness    feet  . Pre-diabetes   . Pulmonary embolism (HCC)    recurrent, second episode in 06/2020  . Walker as ambulation aid   . Wears dentures   . Wears glasses     Past Surgical History:  Procedure Laterality Date  . ABDOMINAL HYSTERECTOMY     and BSO  . CATARACT EXTRACTION Left 10/2018  . [place]  . COLONOSCOPY    . EYE SURGERY Bilateral    CATARACT SX  . HERNIA REPAIR  2001   incisional  . IR CATHETER TUBE CHANGE  10/12/2020  . IR CATHETER TUBE CHANGE  12/07/2020  . IR CHOLANGIOGRAM EXISTING TUBE  09/28/2020  . IR PERC CHOLECYSTOSTOMY  08/17/2020  . IR RADIOLOGIST EVAL & MGMT  11/22/2020  . JOINT REPLACEMENT Left    Knee  . MAXILLARY ANTROSTOMY Right 01/20/2020   Procedure: MAXILLARY ANTROSTOMY  WITH BIOPSY CALDWELL APPROACH;  Surgeon: Jerrell Belfast, MD;  Location: [place];  Service: ENT;  Laterality: Right;  . MULTIPLE TOOTH EXTRACTIONS  2011  . NASAL ENDOSCOPY Right 02/17/2020   Procedure: NASAL ENDOSCOPY WITH RIGHT INFERIOR TURNBINATE REDUCTION;  Surgeon: Jerrell Belfast, MD;  Location: [place];  Service: ENT;  Laterality: Right;  . neck mass removal     "fatty tissue, not cancerous" per pt's son  . SINUS ENDO WITH FUSION Right 01/20/2020   Procedure: SINUS ENDO WITH FUSION WITH BIOSPY;  Surgeon: Jerrell Belfast, MD;  Location: [place];  Service: ENT;  Laterality: Right;  . TOTAL KNEE ARTHROPLASTY Left 07/17/2014   Procedure: TOTAL KNEE ARTHROPLASTY;  Surgeon: Kerin Salen, MD;  Location: [place];  Service: Orthopedics;   Laterality: Left;    Current Medications: No outpatient medications have been marked as taking for the 12/11/20 encounter (Appointment) with Darreld Mclean, PA-C.     Allergies:   Tylenol [acetaminophen] and Sulfa antibiotics   Social History   Socioeconomic History  . Marital status: Divorced    Spouse name: Not on file  . Number of children: 2  . Years of education: Not on file  . Highest education level: High school graduate  Occupational History  . Not on file  Tobacco Use  . Smoking status: Never Smoker  . Smokeless tobacco: Never Used  Vaping Use  . Vaping Use: Never used  Substance and Sexual Activity  . Alcohol use: No  . Drug use: No  . Sexual activity: Not Currently    Comment: Hysterectoomy  Other Topics Concern  . Not on file  Social History Narrative   Lives alone    Right handed   Social Determinants of Health   Financial Resource Strain: Not on file  Food Insecurity: Not on file  Transportation Needs: Not on file  Physical Activity: Not on file  Stress: Not on file  Social Connections:  Not on file     Family History: The patient's ***family history includes Cancer in her sister; Colon cancer in her sister.  ROS:   Please see the history of present illness.    *** All other systems reviewed and are negative.  EKGs/Labs/Other Studies Reviewed:    The following studies were reviewed today:  Echocardiogram 07/19/2020: Impressions:1. Left ventricular ejection fraction, by estimation, is 70 to 75%. The  left ventricle has hyperdynamic function. The left ventricle has no  regional wall motion abnormalities. There is mild left ventricular  hypertrophy. Left ventricular diastolic  parameters are consistent with Grade I diastolic dysfunction (impaired  relaxation).  2. Positive Mcconels signs, with severe hypokinesia of the mid free wall  with sparing/hypercontractile apex. The enlarged RV leads to underfilling  of the LV. . Right ventricular  systolic function is severely reduced. The  right ventricular size is  severely enlarged. There is moderately elevated pulmonary artery systolic  pressure.  3. Right atrial size was severely dilated.  4. The mitral valve is normal in structure. No evidence of mitral valve  regurgitation. No evidence of mitral stenosis.  5. The aortic valve has an indeterminant number of cusps. Aortic valve  regurgitation is not visualized. No aortic stenosis is present.  6. Moderate pulmonary HTN with PASP 57 mmHg. PASP may underestimate  severity of true pulmonary vascular resistance given severe RV  dysfunciton.  7. The inferior vena cava is dilated in size with <50% respiratory  variability, suggesting right atrial pressure of 15 mmHg.  _______________  Echocardiogram 11/29/2020: Impressions: 1. Left ventricular ejection fraction, by estimation, is 65 to 70%. The  left ventricle has normal function. The left ventricle has no regional  wall motion abnormalities. There is mild left ventricular hypertrophy.  Indeterminate diastolic filling due to  E-A fusion from first degree AV block.  2. Right ventricular systolic function is normal. The right ventricular  size is mildly enlarged. There is normal pulmonary artery systolic  pressure. The estimated right ventricular systolic pressure is 64.3 mmHg.  3. The mitral valve is normal in structure. Trivial mitral valve  regurgitation. No evidence of mitral stenosis.  4. The aortic valve is grossly normal. There is mild calcification of the  aortic valve. Aortic valve regurgitation is not visualized. No aortic  stenosis is present.  5. The inferior vena cava is normal in size with greater than 50%  respiratory variability, suggesting right atrial pressure of 3 mmHg.   Comparison(s): A prior study was performed on 07/19/20. Prior images  reviewed side by side. RV size and function have improved significantly.   EKG:  EKG not ordered today.    Recent Labs: 07/22/2020: B Natriuretic Peptide 1,402.1 08/21/2020: ALT 14 08/23/2020: BUN <5; Creatinine, Ser 0.42; Magnesium 1.7; Potassium 3.6; Sodium 136 09/03/2020: Hemoglobin 11.8; Platelets 194  Recent Lipid Panel    Component Value Date/Time   CHOL 129 02/13/2016 0455   TRIG 61 02/13/2016 0455   HDL 48 02/13/2016 0455   CHOLHDL 2.7 02/13/2016 0455   VLDL 12 02/13/2016 0455   LDLCALC 69 02/13/2016 0455    Physical Exam:    Vital Signs: LMP  (LMP Unknown)     Wt Readings from Last 3 Encounters:  09/03/20 166 lb (75.3 kg)  08/16/20 172 lb 9.9 oz (78.3 kg)  07/25/20 181 lb 10.5 oz (82.4 kg)     General: 70 y.o. female in no acute distress. HEENT: Normocephalic and atraumatic. Sclera clear. EOMs intact. Neck: Supple. No carotid  bruits. No JVD. Heart: *** RRR. Distinct S1 and S2. No murmurs, gallops, or rubs. Radial and distal pedal pulses 2+ and equal bilaterally. Lungs: No increased work of breathing. Clear to ausculation bilaterally. No wheezes, rhonchi, or rales.  Abdomen: Soft, non-distended, and non-tender to palpation. Bowel sounds present in all 4 quadrants.  MSK: Normal strength and tone for age. *** Extremities: No lower extremity edema.    Skin: Warm and dry. Neuro: Alert and oriented x3. No focal deficits. Psych: Normal affect. Responds appropriately.   Assessment:    No diagnosis found.  Plan:     Disposition: Follow up in ***   Medication Adjustments/Labs and Tests Ordered: Current medicines are reviewed at length with the patient today.  Concerns regarding medicines are outlined above.  No orders of the defined types were placed in this encounter.  No orders of the defined types were placed in this encounter.   There are no Patient Instructions on file for this visit.   Signed, Darreld Mclean, PA-C  12/07/2020 4:05 PM    [facility]

## 2020-12-10 ENCOUNTER — Ambulatory Visit: Payer: Medicare Other | Admitting: Cardiology

## 2020-12-11 ENCOUNTER — Encounter: Payer: Self-pay | Admitting: Cardiology

## 2020-12-11 ENCOUNTER — Telehealth: Payer: Medicare Other | Admitting: Student

## 2020-12-11 ENCOUNTER — Telehealth (INDEPENDENT_AMBULATORY_CARE_PROVIDER_SITE_OTHER): Payer: Medicare Other | Admitting: Cardiology

## 2020-12-11 VITALS — BP 105/53 | HR 78

## 2020-12-11 DIAGNOSIS — C8330 Diffuse large B-cell lymphoma, unspecified site: Secondary | ICD-10-CM | POA: Diagnosis not present

## 2020-12-11 DIAGNOSIS — Z7901 Long term (current) use of anticoagulants: Secondary | ICD-10-CM

## 2020-12-11 DIAGNOSIS — I4891 Unspecified atrial fibrillation: Secondary | ICD-10-CM

## 2020-12-11 DIAGNOSIS — I2602 Saddle embolus of pulmonary artery with acute cor pulmonale: Secondary | ICD-10-CM

## 2020-12-11 NOTE — Patient Instructions (Signed)
Medication Instructions:  Continue current medications  *If you need a refill on your cardiac medications before your next appointment, please call your pharmacy*   Lab Work: None Ordered   Testing/Procedures: None Ordered   Follow-Up: At [facility], you and your health needs are our priority.  As part of our continuing mission to provide you with exceptional heart care, we have created designated Provider Care Teams.  These Care Teams include your primary Cardiologist (physician) and Advanced Practice Providers (APPs -  Physician Assistants and Nurse Practitioners) who all work together to provide you with the care you need, when you need it.  We recommend signing up for the patient portal called "MyChart".  Sign up information is provided on this After Visit Summary.  MyChart is used to connect with patients for Virtual Visits (Telemedicine).  Patients are able to view lab/test results, encounter notes, upcoming appointments, etc.  Non-urgent messages can be sent to your provider as well.   To learn more about what you can do with MyChart, go to [email or web address].    Your next appointment:   3 Months  The format for your next appointment:   In person  Provider:   Dr Minus Breeding or an APP

## 2020-12-11 NOTE — Progress Notes (Signed)
Virtual Visit via Video Note   This visit type was conducted due to national recommendations for restrictions regarding the COVID-19 Pandemic (e.g. social distancing) in an effort to limit this patient's exposure and mitigate transmission in our community.  Due to her co-morbid illnesses, this patient is at least at moderate risk for complications without adequate follow up.  This format is felt to be most appropriate for this patient at this time.  All issues noted in this document were discussed and addressed.  A limited physical exam was performed with this format.  Please refer to the patient's chart for her consent to telehealth for [facility].       Date:  12/11/2020   ID:  Lauren Santana, DOB 1951/07/07, MRN [id number] The patient was identified using 2 identifiers.  Patient Location: Home Provider Location: Home Office  PCP:  Lucianne Lei, MD  Cardiologist:  Minus Breeding, MD  Electrophysiologist:  None   Evaluation Performed:  Follow-Up Visit  Chief Complaint:  none  History of Present Illness:    Lauren Santana is a 70 y.o. female with a history of pulmonary embolism in 2017, diffuse large cell lymphoma, and PAF.  She was hospitalized in August 2021 and had a recurrent saddle pulmonary embolism with RV strain and a left DVT.  She was thrombopenia secondary to her lymphoma.  Echocardiogram then showed an ejection fraction of 70 to 75%.  She had an enlarged RV with severe RV dysfunction.  During that admission she did have PAF, cardiology was consulted.  She was discharged on anticoagulation.  She was seen in follow-up 09/03/2020.  No EKG was done that visit but her exam notes regular rhythm with a heart rate of 88.  She was otherwise doing well from a cardiac standpoint.  The plan was for a follow-up echocardiogram in 2 months.  She in fact did have this echocardiogram November 29, 2020 and it showed normalization of her RV function.  She was contacted today for  routine follow-up.  Her son was in on the video conference call.  The patient says she has been doing well from a cardiac standpoint.  She denies any chest pain or shortness of breath.  She is unaware of any tachycardia.  She is troubled by a sinus infection and I advised her to take regular Claritin and Tylenol and avoid any over-the-counter preparations with pseudoephedrine or phenylephrine.  She told me she may need gallbladder surgery soon, she will see the surgeon next month.  I encouraged her to contact us if she does in fact need surgery and not stop her anticoagulation without checking with [place] first.    The patient does not have symptoms concerning for COVID-19 infection (fever, chills, cough, or new shortness of breath).    Past Medical History:  Diagnosis Date  . Arthritis   . Edema    B/LLE  . Endometriosis   . High cholesterol   . Hypertension   . Numbness    feet  . Pre-diabetes   . Pulmonary embolism (HCC)    recurrent, second episode in 06/2020  . Walker as ambulation aid   . Wears dentures   . Wears glasses    Past Surgical History:  Procedure Laterality Date  . ABDOMINAL HYSTERECTOMY     and BSO  . CATARACT EXTRACTION Left 10/2018  . [place]  . COLONOSCOPY    . EYE SURGERY Bilateral    CATARACT SX  . HERNIA REPAIR  2001   incisional  . IR CATHETER TUBE CHANGE  10/12/2020  . IR CATHETER TUBE CHANGE  12/07/2020  . IR CHOLANGIOGRAM EXISTING TUBE  09/28/2020  . IR PERC CHOLECYSTOSTOMY  08/17/2020  . IR RADIOLOGIST EVAL & MGMT  11/22/2020  . JOINT REPLACEMENT Left    Knee  . MAXILLARY ANTROSTOMY Right 01/20/2020   Procedure: MAXILLARY ANTROSTOMY  WITH BIOPSY CALDWELL APPROACH;  Surgeon: Jerrell Belfast, MD;  Location: [place];  Service: ENT;  Laterality: Right;  . MULTIPLE TOOTH EXTRACTIONS  2011  . NASAL ENDOSCOPY Right 02/17/2020   Procedure: NASAL ENDOSCOPY WITH RIGHT INFERIOR TURNBINATE REDUCTION;  Surgeon: Jerrell Belfast, MD;  Location:  [place];  Service: ENT;  Laterality: Right;  . neck mass removal     "fatty tissue, not cancerous" per pt's son  . SINUS ENDO WITH FUSION Right 01/20/2020   Procedure: SINUS ENDO WITH FUSION WITH BIOSPY;  Surgeon: Jerrell Belfast, MD;  Location: [place];  Service: ENT;  Laterality: Right;  . TOTAL KNEE ARTHROPLASTY Left 07/17/2014   Procedure: TOTAL KNEE ARTHROPLASTY;  Surgeon: Kerin Salen, MD;  Location: [place];  Service: Orthopedics;  Laterality: Left;     Current Meds  Medication Sig  . acetaminophen (TYLENOL) 500 MG tablet Take 1 tablet (500 mg total) by mouth every 8 (eight) hours.  . diclofenac Sodium (VOLTAREN) 1 % GEL Apply 2 g topically daily as needed (pain).   Marland Kitchen gabapentin (NEURONTIN) 300 MG capsule Take 1 capsule (300 mg total) by mouth 3 (three) times daily.  . Lactobacillus (PROBIOTIC ACIDOPHILUS) CAPS Take 1 tablet by mouth in the morning and at bedtime.  . mupirocin ointment (BACTROBAN) 2 % Apply 1 application topically 2 (two) times daily.  . ondansetron (ZOFRAN) 4 MG tablet Take 1 tablet (4 mg total) by mouth 2 (two) times daily as needed for nausea.  Marland Kitchen oxyCODONE (ROXICODONE) 5 MG immediate release tablet Take 1 tablet (5 mg total) by mouth every 8 (eight) hours as needed for severe pain or breakthrough pain.  . potassium chloride SA (KLOR-CON) 20 MEQ tablet Take 2 tablets (40 mEq total) by mouth daily.  . prochlorperazine (COMPAZINE) 5 MG tablet Take 1 tablet (5 mg total) by mouth every 6 (six) hours as needed for nausea or vomiting.  . rivaroxaban (XARELTO) 20 MG TABS tablet Take 1 tablet (20 mg total) by mouth daily with supper.     Allergies:   Tylenol [acetaminophen] and Sulfa antibiotics   Social History   Tobacco Use  . Smoking status: Never Smoker  . Smokeless tobacco: Never Used  Vaping Use  . Vaping Use: Never used  Substance Use Topics  . Alcohol use: No  . Drug use: No     Family Hx: The patient's family history includes Cancer in her sister; Colon  cancer in her sister.  ROS:   Please see the history of present illness.    All other systems reviewed and are negative.   Prior CV studies:   The following studies were reviewed today:  Echo 11/29/2020- 1. Left ventricular ejection fraction, by estimation, is 65 to 70%. The  left ventricle has normal function. The left ventricle has no regional  wall motion abnormalities. There is mild left ventricular hypertrophy.  Indeterminate diastolic filling due to  E-A fusion from first degree AV block.  2. Right ventricular systolic function is normal. The right ventricular  size is mildly enlarged. There is normal pulmonary artery systolic  pressure. The estimated right ventricular systolic  pressure is 29.8 mmHg.  3. The mitral valve is normal in structure. Trivial mitral valve  regurgitation. No evidence of mitral stenosis.  4. The aortic valve is grossly normal. There is mild calcification of the  aortic valve. Aortic valve regurgitation is not visualized. No aortic  stenosis is present.  5. The inferior vena cava is normal in size with greater than 50%  respiratory variability, suggesting right atrial pressure of 3 mmHg.   Labs/Other Tests and Data Reviewed:    EKG:  An ECG dated 08/20/2020 was personally reviewed today and demonstrated:  AF with VR 100  Recent Labs: 07/22/2020: B Natriuretic Peptide 1,402.1 08/21/2020: ALT 14 08/23/2020: BUN <5; Creatinine, Ser 0.42; Magnesium 1.7; Potassium 3.6; Sodium 136 09/03/2020: Hemoglobin 11.8; Platelets 194   Recent Lipid Panel Lab Results  Component Value Date/Time   CHOL 129 02/13/2016 04:55 AM   TRIG 61 02/13/2016 04:55 AM   HDL 48 02/13/2016 04:55 AM   CHOLHDL 2.7 02/13/2016 04:55 AM   LDLCALC 69 02/13/2016 04:55 AM    Wt Readings from Last 3 Encounters:  09/03/20 166 lb (75.3 kg)  08/16/20 172 lb 9.9 oz (78.3 kg)  07/25/20 181 lb 10.5 oz (82.4 kg)     Risk Assessment/Calculations:      :[id number]  CHA2DS2-VASc Score =    2 This indicates a  % annual risk of stroke. The patient's score is based upon:age sex- already on lifelong AC secondary to PE       Objective:    Vital Signs:  BP (!) 105/53   Pulse 78   LMP  (LMP Unknown)    VITAL SIGNS:  reviewed  ASSESSMENT & PLAN:    Recurrent submassive Saddle PE RLE DVT - will now be on life long anticoagulation - now on xarelto   Diastolic dysfunction - lasix stopped at hospital discharge - breathing at baseline, no orthopnea, DOE, or PND - will continue to monitor clinically   PAF  - already on anticoagulation - if RVR, can trial BB or CCB - not currently on rate or rhythm control   RV strain due to large PE - Repeat echo 11/29/2020 shows normalization of RVF   Thrombocytopenia B-cell lymphoma - per oncology at [facility] -5/6 cycles on Thursday   Plan: Same Rx- f/u in 3 months. Will need to discuse anticoagulation with pharmacy and Dr Percival Spanish if she needs GB surgery.        COVID-19 Education: The signs and symptoms of COVID-19 were discussed with the patient and how to seek care for testing (follow up with PCP or arrange E-visit).  The importance of social distancing was discussed today.  Time:   Today, I have spent 20 minutes with the patient with telehealth technology discussing the above problems.     Medication Adjustments/Labs and Tests Ordered: Current medicines are reviewed at length with the patient today.  Concerns regarding medicines are outlined above.   Tests Ordered: No orders of the defined types were placed in this encounter.   Medication Changes: No orders of the defined types were placed in this encounter.   Follow Up:  In Person in 3 months  Signed, Kerin Ransom, Hershal Coria  12/11/2020 4:19 PM    [facility]

## 2020-12-13 DIAGNOSIS — C8330 Diffuse large B-cell lymphoma, unspecified site: Secondary | ICD-10-CM | POA: Diagnosis not present

## 2020-12-13 DIAGNOSIS — K118 Other diseases of salivary glands: Secondary | ICD-10-CM | POA: Diagnosis not present

## 2020-12-13 DIAGNOSIS — Z79899 Other long term (current) drug therapy: Secondary | ICD-10-CM | POA: Diagnosis not present

## 2020-12-20 DIAGNOSIS — E538 Deficiency of other specified B group vitamins: Secondary | ICD-10-CM | POA: Diagnosis not present

## 2020-12-20 DIAGNOSIS — C8330 Diffuse large B-cell lymphoma, unspecified site: Secondary | ICD-10-CM | POA: Diagnosis not present

## 2020-12-20 DIAGNOSIS — E611 Iron deficiency: Secondary | ICD-10-CM | POA: Diagnosis not present

## 2020-12-20 DIAGNOSIS — K819 Cholecystitis, unspecified: Secondary | ICD-10-CM | POA: Diagnosis not present

## 2020-12-20 DIAGNOSIS — D496 Neoplasm of unspecified behavior of brain: Secondary | ICD-10-CM | POA: Diagnosis not present

## 2020-12-20 DIAGNOSIS — U071 COVID-19: Secondary | ICD-10-CM | POA: Diagnosis not present

## 2020-12-28 DIAGNOSIS — K819 Cholecystitis, unspecified: Secondary | ICD-10-CM | POA: Diagnosis not present

## 2020-12-28 DIAGNOSIS — C8510 Unspecified B-cell lymphoma, unspecified site: Secondary | ICD-10-CM | POA: Diagnosis not present

## 2021-01-04 DIAGNOSIS — K119 Disease of salivary gland, unspecified: Secondary | ICD-10-CM | POA: Diagnosis not present

## 2021-01-04 DIAGNOSIS — Z8572 Personal history of non-Hodgkin lymphomas: Secondary | ICD-10-CM | POA: Diagnosis not present

## 2021-01-14 ENCOUNTER — Other Ambulatory Visit (HOSPITAL_COMMUNITY): Payer: Self-pay | Admitting: Surgery

## 2021-01-14 MED FILL — NORMAL SALINE FLUSH SYRINGE: 0.9 | 30 days supply | Qty: 300 | Fill #0

## 2021-01-29 DIAGNOSIS — K819 Cholecystitis, unspecified: Secondary | ICD-10-CM | POA: Diagnosis not present

## 2021-02-05 ENCOUNTER — Telehealth: Payer: Self-pay | Admitting: Podiatry

## 2021-02-05 NOTE — Telephone Encounter (Signed)
pts son called checking on mother's shoes that were ordered.   I reviewed chart and we have returned a few pair of shoes to [employer] and do not see another pair ordered. I think he may have ordered from [employer] or [employer] but not documented.   I told pts son to have pt discuss with Dr Jacqualyn Posey at her appt on 3.21 and we can see if we can order a shoe for her.

## 2021-02-08 DIAGNOSIS — R7303 Prediabetes: Secondary | ICD-10-CM | POA: Diagnosis not present

## 2021-02-08 DIAGNOSIS — E785 Hyperlipidemia, unspecified: Secondary | ICD-10-CM | POA: Diagnosis not present

## 2021-02-08 DIAGNOSIS — I10 Essential (primary) hypertension: Secondary | ICD-10-CM | POA: Diagnosis not present

## 2021-02-08 DIAGNOSIS — R609 Edema, unspecified: Secondary | ICD-10-CM | POA: Diagnosis not present

## 2021-02-08 DIAGNOSIS — C8490 Mature T/NK-cell lymphomas, unspecified, unspecified site: Secondary | ICD-10-CM | POA: Diagnosis not present

## 2021-02-08 DIAGNOSIS — K2091 Esophagitis, unspecified with bleeding: Secondary | ICD-10-CM | POA: Diagnosis not present

## 2021-02-08 DIAGNOSIS — K8001 Calculus of gallbladder with acute cholecystitis with obstruction: Secondary | ICD-10-CM | POA: Diagnosis not present

## 2021-02-11 ENCOUNTER — Ambulatory Visit (INDEPENDENT_AMBULATORY_CARE_PROVIDER_SITE_OTHER): Payer: Medicare Other | Admitting: Podiatry

## 2021-02-11 ENCOUNTER — Other Ambulatory Visit: Payer: Self-pay

## 2021-02-11 DIAGNOSIS — B351 Tinea unguium: Secondary | ICD-10-CM

## 2021-02-11 DIAGNOSIS — Z7901 Long term (current) use of anticoagulants: Secondary | ICD-10-CM | POA: Diagnosis not present

## 2021-02-11 DIAGNOSIS — R609 Edema, unspecified: Secondary | ICD-10-CM | POA: Diagnosis not present

## 2021-02-11 DIAGNOSIS — M2142 Flat foot [pes planus] (acquired), left foot: Secondary | ICD-10-CM

## 2021-02-11 DIAGNOSIS — M79675 Pain in left toe(s): Secondary | ICD-10-CM | POA: Diagnosis not present

## 2021-02-11 DIAGNOSIS — M2141 Flat foot [pes planus] (acquired), right foot: Secondary | ICD-10-CM | POA: Diagnosis not present

## 2021-02-11 DIAGNOSIS — R2681 Unsteadiness on feet: Secondary | ICD-10-CM | POA: Diagnosis not present

## 2021-02-11 DIAGNOSIS — G629 Polyneuropathy, unspecified: Secondary | ICD-10-CM

## 2021-02-11 DIAGNOSIS — M79674 Pain in right toe(s): Secondary | ICD-10-CM

## 2021-02-11 DIAGNOSIS — E1149 Type 2 diabetes mellitus with other diabetic neurological complication: Secondary | ICD-10-CM

## 2021-02-13 NOTE — Progress Notes (Signed)
Subjective: 70 y.o. returns the office today for painful, elongated, thickened toenails which she cannot trim herself. Denies any redness or drainage around the nails.  She states that she feels that her balance is off and she is fearful of falling.  She said physical therapy previously was helpful for her.  Also she presents to discuss different shoe options.  Previous order shoes but they were not fitting.  Denies any acute changes since last appointment and no new complaints today. Denies any systemic complaints such as fevers, chills, nausea, vomiting.   PCP: Lucianne Lei, MD  Objective: AAO 3, NAD DP/PT pulses palpable, CRT less than 3 seconds Chronic bilateral lower extremity edema present. Nails hypertrophic, dystrophic, elongated, brittle, discolored 10. There is tenderness overlying the nails 1-5 bilaterally. There is no surrounding erythema or drainage along the nail sites.  Chronic incurvation present of the toenails without signs of infection. No open lesions or pre-ulcerative lesions are identified. No pain with calf compression, swelling, warmth, erythema.  Assessment: Patient presents with symptomatic onychomycosis; gait instability, chronic edema  Plan: -Treatment options including alternatives, risks, complications were discussed -Nails sharply debrided 10 without complication/bleeding. -Prescription for [employer] given for custom shoes given the edema -Referral to [facility]. -Discussed daily foot inspection. If there are any changes, to call the office immediately.  -Follow-up in 3 months or sooner if any problems are to arise. In the meantime, encouraged to call the office with any questions, concerns, changes symptoms.  Celesta Gentile, DPM

## 2021-02-14 DIAGNOSIS — R600 Localized edema: Secondary | ICD-10-CM | POA: Diagnosis not present

## 2021-02-14 DIAGNOSIS — D508 Other iron deficiency anemias: Secondary | ICD-10-CM | POA: Diagnosis not present

## 2021-02-14 DIAGNOSIS — E539 Vitamin B deficiency, unspecified: Secondary | ICD-10-CM | POA: Diagnosis not present

## 2021-02-14 DIAGNOSIS — K819 Cholecystitis, unspecified: Secondary | ICD-10-CM | POA: Diagnosis not present

## 2021-02-14 DIAGNOSIS — D509 Iron deficiency anemia, unspecified: Secondary | ICD-10-CM | POA: Diagnosis not present

## 2021-02-14 DIAGNOSIS — C8330 Diffuse large B-cell lymphoma, unspecified site: Secondary | ICD-10-CM | POA: Diagnosis not present

## 2021-02-14 DIAGNOSIS — R609 Edema, unspecified: Secondary | ICD-10-CM | POA: Diagnosis not present

## 2021-02-14 DIAGNOSIS — D496 Neoplasm of unspecified behavior of brain: Secondary | ICD-10-CM | POA: Diagnosis not present

## 2021-02-14 DIAGNOSIS — Z9689 Presence of other specified functional implants: Secondary | ICD-10-CM | POA: Diagnosis not present

## 2021-02-21 DIAGNOSIS — C8490 Mature T/NK-cell lymphomas, unspecified, unspecified site: Secondary | ICD-10-CM | POA: Diagnosis not present

## 2021-02-21 DIAGNOSIS — R609 Edema, unspecified: Secondary | ICD-10-CM | POA: Diagnosis not present

## 2021-02-21 DIAGNOSIS — G894 Chronic pain syndrome: Secondary | ICD-10-CM | POA: Diagnosis not present

## 2021-02-21 DIAGNOSIS — I10 Essential (primary) hypertension: Secondary | ICD-10-CM | POA: Diagnosis not present

## 2021-02-21 DIAGNOSIS — E785 Hyperlipidemia, unspecified: Secondary | ICD-10-CM | POA: Diagnosis not present

## 2021-02-21 DIAGNOSIS — R7303 Prediabetes: Secondary | ICD-10-CM | POA: Diagnosis not present

## 2021-02-26 DIAGNOSIS — C8331 Diffuse large B-cell lymphoma, lymph nodes of head, face, and neck: Secondary | ICD-10-CM | POA: Diagnosis not present

## 2021-02-26 DIAGNOSIS — Z20822 Contact with and (suspected) exposure to covid-19: Secondary | ICD-10-CM | POA: Diagnosis not present

## 2021-02-27 DIAGNOSIS — R269 Unspecified abnormalities of gait and mobility: Secondary | ICD-10-CM | POA: Diagnosis not present

## 2021-02-27 DIAGNOSIS — M6259 Muscle wasting and atrophy, not elsewhere classified, multiple sites: Secondary | ICD-10-CM | POA: Diagnosis not present

## 2021-02-27 DIAGNOSIS — M25472 Effusion, left ankle: Secondary | ICD-10-CM | POA: Diagnosis not present

## 2021-02-27 DIAGNOSIS — M25672 Stiffness of left ankle, not elsewhere classified: Secondary | ICD-10-CM | POA: Diagnosis not present

## 2021-02-27 DIAGNOSIS — M25671 Stiffness of right ankle, not elsewhere classified: Secondary | ICD-10-CM | POA: Diagnosis not present

## 2021-02-27 DIAGNOSIS — M25471 Effusion, right ankle: Secondary | ICD-10-CM | POA: Diagnosis not present

## 2021-03-01 DIAGNOSIS — M25471 Effusion, right ankle: Secondary | ICD-10-CM | POA: Diagnosis not present

## 2021-03-01 DIAGNOSIS — R269 Unspecified abnormalities of gait and mobility: Secondary | ICD-10-CM | POA: Diagnosis not present

## 2021-03-01 DIAGNOSIS — M25671 Stiffness of right ankle, not elsewhere classified: Secondary | ICD-10-CM | POA: Diagnosis not present

## 2021-03-01 DIAGNOSIS — M6259 Muscle wasting and atrophy, not elsewhere classified, multiple sites: Secondary | ICD-10-CM | POA: Diagnosis not present

## 2021-03-01 DIAGNOSIS — M25472 Effusion, left ankle: Secondary | ICD-10-CM | POA: Diagnosis not present

## 2021-03-01 DIAGNOSIS — M25672 Stiffness of left ankle, not elsewhere classified: Secondary | ICD-10-CM | POA: Diagnosis not present

## 2021-03-04 DIAGNOSIS — R269 Unspecified abnormalities of gait and mobility: Secondary | ICD-10-CM | POA: Diagnosis not present

## 2021-03-04 DIAGNOSIS — M25671 Stiffness of right ankle, not elsewhere classified: Secondary | ICD-10-CM | POA: Diagnosis not present

## 2021-03-04 DIAGNOSIS — M6259 Muscle wasting and atrophy, not elsewhere classified, multiple sites: Secondary | ICD-10-CM | POA: Diagnosis not present

## 2021-03-04 DIAGNOSIS — M25471 Effusion, right ankle: Secondary | ICD-10-CM | POA: Diagnosis not present

## 2021-03-04 DIAGNOSIS — M25472 Effusion, left ankle: Secondary | ICD-10-CM | POA: Diagnosis not present

## 2021-03-04 DIAGNOSIS — M25672 Stiffness of left ankle, not elsewhere classified: Secondary | ICD-10-CM | POA: Diagnosis not present

## 2021-03-06 DIAGNOSIS — K819 Cholecystitis, unspecified: Secondary | ICD-10-CM | POA: Diagnosis not present

## 2021-03-08 DIAGNOSIS — M25472 Effusion, left ankle: Secondary | ICD-10-CM | POA: Diagnosis not present

## 2021-03-08 DIAGNOSIS — M6259 Muscle wasting and atrophy, not elsewhere classified, multiple sites: Secondary | ICD-10-CM | POA: Diagnosis not present

## 2021-03-08 DIAGNOSIS — M25672 Stiffness of left ankle, not elsewhere classified: Secondary | ICD-10-CM | POA: Diagnosis not present

## 2021-03-08 DIAGNOSIS — R269 Unspecified abnormalities of gait and mobility: Secondary | ICD-10-CM | POA: Diagnosis not present

## 2021-03-08 DIAGNOSIS — M25671 Stiffness of right ankle, not elsewhere classified: Secondary | ICD-10-CM | POA: Diagnosis not present

## 2021-03-08 DIAGNOSIS — M25471 Effusion, right ankle: Secondary | ICD-10-CM | POA: Diagnosis not present

## 2021-03-11 DIAGNOSIS — M25672 Stiffness of left ankle, not elsewhere classified: Secondary | ICD-10-CM | POA: Diagnosis not present

## 2021-03-11 DIAGNOSIS — M6259 Muscle wasting and atrophy, not elsewhere classified, multiple sites: Secondary | ICD-10-CM | POA: Diagnosis not present

## 2021-03-11 DIAGNOSIS — M25471 Effusion, right ankle: Secondary | ICD-10-CM | POA: Diagnosis not present

## 2021-03-11 DIAGNOSIS — M25671 Stiffness of right ankle, not elsewhere classified: Secondary | ICD-10-CM | POA: Diagnosis not present

## 2021-03-11 DIAGNOSIS — M25472 Effusion, left ankle: Secondary | ICD-10-CM | POA: Diagnosis not present

## 2021-03-11 DIAGNOSIS — R269 Unspecified abnormalities of gait and mobility: Secondary | ICD-10-CM | POA: Diagnosis not present

## 2021-03-15 DIAGNOSIS — M25671 Stiffness of right ankle, not elsewhere classified: Secondary | ICD-10-CM | POA: Diagnosis not present

## 2021-03-15 DIAGNOSIS — R269 Unspecified abnormalities of gait and mobility: Secondary | ICD-10-CM | POA: Diagnosis not present

## 2021-03-15 DIAGNOSIS — M6259 Muscle wasting and atrophy, not elsewhere classified, multiple sites: Secondary | ICD-10-CM | POA: Diagnosis not present

## 2021-03-15 DIAGNOSIS — M25471 Effusion, right ankle: Secondary | ICD-10-CM | POA: Diagnosis not present

## 2021-03-15 DIAGNOSIS — M25472 Effusion, left ankle: Secondary | ICD-10-CM | POA: Diagnosis not present

## 2021-03-15 DIAGNOSIS — M25672 Stiffness of left ankle, not elsewhere classified: Secondary | ICD-10-CM | POA: Diagnosis not present

## 2021-03-18 DIAGNOSIS — M25471 Effusion, right ankle: Secondary | ICD-10-CM | POA: Diagnosis not present

## 2021-03-18 DIAGNOSIS — M25671 Stiffness of right ankle, not elsewhere classified: Secondary | ICD-10-CM | POA: Diagnosis not present

## 2021-03-18 DIAGNOSIS — R269 Unspecified abnormalities of gait and mobility: Secondary | ICD-10-CM | POA: Diagnosis not present

## 2021-03-18 DIAGNOSIS — M25672 Stiffness of left ankle, not elsewhere classified: Secondary | ICD-10-CM | POA: Diagnosis not present

## 2021-03-18 DIAGNOSIS — M6259 Muscle wasting and atrophy, not elsewhere classified, multiple sites: Secondary | ICD-10-CM | POA: Diagnosis not present

## 2021-03-18 DIAGNOSIS — M25472 Effusion, left ankle: Secondary | ICD-10-CM | POA: Diagnosis not present

## 2021-03-21 DIAGNOSIS — S22088D Other fracture of T11-T12 vertebra, subsequent encounter for fracture with routine healing: Secondary | ICD-10-CM | POA: Diagnosis not present

## 2021-03-21 DIAGNOSIS — M6281 Muscle weakness (generalized): Secondary | ICD-10-CM | POA: Diagnosis not present

## 2021-03-22 DIAGNOSIS — M25472 Effusion, left ankle: Secondary | ICD-10-CM | POA: Diagnosis not present

## 2021-03-22 DIAGNOSIS — R269 Unspecified abnormalities of gait and mobility: Secondary | ICD-10-CM | POA: Diagnosis not present

## 2021-03-22 DIAGNOSIS — M6259 Muscle wasting and atrophy, not elsewhere classified, multiple sites: Secondary | ICD-10-CM | POA: Diagnosis not present

## 2021-03-22 DIAGNOSIS — M25671 Stiffness of right ankle, not elsewhere classified: Secondary | ICD-10-CM | POA: Diagnosis not present

## 2021-03-22 DIAGNOSIS — M25471 Effusion, right ankle: Secondary | ICD-10-CM | POA: Diagnosis not present

## 2021-03-22 DIAGNOSIS — M25672 Stiffness of left ankle, not elsewhere classified: Secondary | ICD-10-CM | POA: Diagnosis not present

## 2021-03-23 DIAGNOSIS — I10 Essential (primary) hypertension: Secondary | ICD-10-CM | POA: Diagnosis not present

## 2021-03-23 DIAGNOSIS — E1169 Type 2 diabetes mellitus with other specified complication: Secondary | ICD-10-CM | POA: Diagnosis not present

## 2021-03-23 DIAGNOSIS — E785 Hyperlipidemia, unspecified: Secondary | ICD-10-CM | POA: Diagnosis not present

## 2021-03-25 DIAGNOSIS — R269 Unspecified abnormalities of gait and mobility: Secondary | ICD-10-CM | POA: Diagnosis not present

## 2021-03-25 DIAGNOSIS — M25471 Effusion, right ankle: Secondary | ICD-10-CM | POA: Diagnosis not present

## 2021-03-25 DIAGNOSIS — M25672 Stiffness of left ankle, not elsewhere classified: Secondary | ICD-10-CM | POA: Diagnosis not present

## 2021-03-25 DIAGNOSIS — M25472 Effusion, left ankle: Secondary | ICD-10-CM | POA: Diagnosis not present

## 2021-03-25 DIAGNOSIS — M6259 Muscle wasting and atrophy, not elsewhere classified, multiple sites: Secondary | ICD-10-CM | POA: Diagnosis not present

## 2021-03-25 DIAGNOSIS — M25671 Stiffness of right ankle, not elsewhere classified: Secondary | ICD-10-CM | POA: Diagnosis not present

## 2021-03-29 DIAGNOSIS — M25471 Effusion, right ankle: Secondary | ICD-10-CM | POA: Diagnosis not present

## 2021-03-29 DIAGNOSIS — M25472 Effusion, left ankle: Secondary | ICD-10-CM | POA: Diagnosis not present

## 2021-03-29 DIAGNOSIS — M25671 Stiffness of right ankle, not elsewhere classified: Secondary | ICD-10-CM | POA: Diagnosis not present

## 2021-03-29 DIAGNOSIS — R269 Unspecified abnormalities of gait and mobility: Secondary | ICD-10-CM | POA: Diagnosis not present

## 2021-03-29 DIAGNOSIS — M25672 Stiffness of left ankle, not elsewhere classified: Secondary | ICD-10-CM | POA: Diagnosis not present

## 2021-03-29 DIAGNOSIS — M6259 Muscle wasting and atrophy, not elsewhere classified, multiple sites: Secondary | ICD-10-CM | POA: Diagnosis not present

## 2021-04-01 DIAGNOSIS — M6259 Muscle wasting and atrophy, not elsewhere classified, multiple sites: Secondary | ICD-10-CM | POA: Diagnosis not present

## 2021-04-01 DIAGNOSIS — R269 Unspecified abnormalities of gait and mobility: Secondary | ICD-10-CM | POA: Diagnosis not present

## 2021-04-01 DIAGNOSIS — M25672 Stiffness of left ankle, not elsewhere classified: Secondary | ICD-10-CM | POA: Diagnosis not present

## 2021-04-01 DIAGNOSIS — M25471 Effusion, right ankle: Secondary | ICD-10-CM | POA: Diagnosis not present

## 2021-04-01 DIAGNOSIS — M25671 Stiffness of right ankle, not elsewhere classified: Secondary | ICD-10-CM | POA: Diagnosis not present

## 2021-04-01 DIAGNOSIS — M25472 Effusion, left ankle: Secondary | ICD-10-CM | POA: Diagnosis not present

## 2021-04-04 DIAGNOSIS — E785 Hyperlipidemia, unspecified: Secondary | ICD-10-CM | POA: Diagnosis not present

## 2021-04-04 DIAGNOSIS — I10 Essential (primary) hypertension: Secondary | ICD-10-CM | POA: Diagnosis not present

## 2021-04-05 DIAGNOSIS — M25671 Stiffness of right ankle, not elsewhere classified: Secondary | ICD-10-CM | POA: Diagnosis not present

## 2021-04-05 DIAGNOSIS — M25471 Effusion, right ankle: Secondary | ICD-10-CM | POA: Diagnosis not present

## 2021-04-05 DIAGNOSIS — M25672 Stiffness of left ankle, not elsewhere classified: Secondary | ICD-10-CM | POA: Diagnosis not present

## 2021-04-05 DIAGNOSIS — M25472 Effusion, left ankle: Secondary | ICD-10-CM | POA: Diagnosis not present

## 2021-04-05 DIAGNOSIS — M6259 Muscle wasting and atrophy, not elsewhere classified, multiple sites: Secondary | ICD-10-CM | POA: Diagnosis not present

## 2021-04-05 DIAGNOSIS — R269 Unspecified abnormalities of gait and mobility: Secondary | ICD-10-CM | POA: Diagnosis not present

## 2021-04-08 DIAGNOSIS — M25672 Stiffness of left ankle, not elsewhere classified: Secondary | ICD-10-CM | POA: Diagnosis not present

## 2021-04-08 DIAGNOSIS — M25472 Effusion, left ankle: Secondary | ICD-10-CM | POA: Diagnosis not present

## 2021-04-08 DIAGNOSIS — M25471 Effusion, right ankle: Secondary | ICD-10-CM | POA: Diagnosis not present

## 2021-04-08 DIAGNOSIS — M6259 Muscle wasting and atrophy, not elsewhere classified, multiple sites: Secondary | ICD-10-CM | POA: Diagnosis not present

## 2021-04-08 DIAGNOSIS — R269 Unspecified abnormalities of gait and mobility: Secondary | ICD-10-CM | POA: Diagnosis not present

## 2021-04-08 DIAGNOSIS — M25671 Stiffness of right ankle, not elsewhere classified: Secondary | ICD-10-CM | POA: Diagnosis not present

## 2021-04-12 DIAGNOSIS — R269 Unspecified abnormalities of gait and mobility: Secondary | ICD-10-CM | POA: Diagnosis not present

## 2021-04-12 DIAGNOSIS — M25672 Stiffness of left ankle, not elsewhere classified: Secondary | ICD-10-CM | POA: Diagnosis not present

## 2021-04-12 DIAGNOSIS — M25471 Effusion, right ankle: Secondary | ICD-10-CM | POA: Diagnosis not present

## 2021-04-12 DIAGNOSIS — M6259 Muscle wasting and atrophy, not elsewhere classified, multiple sites: Secondary | ICD-10-CM | POA: Diagnosis not present

## 2021-04-12 DIAGNOSIS — M25472 Effusion, left ankle: Secondary | ICD-10-CM | POA: Diagnosis not present

## 2021-04-12 DIAGNOSIS — M25671 Stiffness of right ankle, not elsewhere classified: Secondary | ICD-10-CM | POA: Diagnosis not present

## 2021-04-15 DIAGNOSIS — M25471 Effusion, right ankle: Secondary | ICD-10-CM | POA: Diagnosis not present

## 2021-04-15 DIAGNOSIS — M25672 Stiffness of left ankle, not elsewhere classified: Secondary | ICD-10-CM | POA: Diagnosis not present

## 2021-04-15 DIAGNOSIS — R269 Unspecified abnormalities of gait and mobility: Secondary | ICD-10-CM | POA: Diagnosis not present

## 2021-04-15 DIAGNOSIS — M6259 Muscle wasting and atrophy, not elsewhere classified, multiple sites: Secondary | ICD-10-CM | POA: Diagnosis not present

## 2021-04-15 DIAGNOSIS — M25472 Effusion, left ankle: Secondary | ICD-10-CM | POA: Diagnosis not present

## 2021-04-15 DIAGNOSIS — M25671 Stiffness of right ankle, not elsewhere classified: Secondary | ICD-10-CM | POA: Diagnosis not present

## 2021-04-19 DIAGNOSIS — M25471 Effusion, right ankle: Secondary | ICD-10-CM | POA: Diagnosis not present

## 2021-04-19 DIAGNOSIS — R269 Unspecified abnormalities of gait and mobility: Secondary | ICD-10-CM | POA: Diagnosis not present

## 2021-04-19 DIAGNOSIS — M25472 Effusion, left ankle: Secondary | ICD-10-CM | POA: Diagnosis not present

## 2021-04-19 DIAGNOSIS — M6259 Muscle wasting and atrophy, not elsewhere classified, multiple sites: Secondary | ICD-10-CM | POA: Diagnosis not present

## 2021-04-19 DIAGNOSIS — M25672 Stiffness of left ankle, not elsewhere classified: Secondary | ICD-10-CM | POA: Diagnosis not present

## 2021-04-19 DIAGNOSIS — M25671 Stiffness of right ankle, not elsewhere classified: Secondary | ICD-10-CM | POA: Diagnosis not present

## 2021-04-20 DIAGNOSIS — M6281 Muscle weakness (generalized): Secondary | ICD-10-CM | POA: Diagnosis not present

## 2021-04-20 DIAGNOSIS — S22088D Other fracture of T11-T12 vertebra, subsequent encounter for fracture with routine healing: Secondary | ICD-10-CM | POA: Diagnosis not present

## 2021-04-26 DIAGNOSIS — M6259 Muscle wasting and atrophy, not elsewhere classified, multiple sites: Secondary | ICD-10-CM | POA: Diagnosis not present

## 2021-04-26 DIAGNOSIS — M25472 Effusion, left ankle: Secondary | ICD-10-CM | POA: Diagnosis not present

## 2021-04-26 DIAGNOSIS — M25671 Stiffness of right ankle, not elsewhere classified: Secondary | ICD-10-CM | POA: Diagnosis not present

## 2021-04-26 DIAGNOSIS — M25672 Stiffness of left ankle, not elsewhere classified: Secondary | ICD-10-CM | POA: Diagnosis not present

## 2021-04-26 DIAGNOSIS — M25471 Effusion, right ankle: Secondary | ICD-10-CM | POA: Diagnosis not present

## 2021-04-26 DIAGNOSIS — R269 Unspecified abnormalities of gait and mobility: Secondary | ICD-10-CM | POA: Diagnosis not present

## 2021-05-02 ENCOUNTER — Ambulatory Visit: Payer: Medicare Other | Admitting: Podiatry

## 2021-05-03 DIAGNOSIS — M25472 Effusion, left ankle: Secondary | ICD-10-CM | POA: Diagnosis not present

## 2021-05-03 DIAGNOSIS — R269 Unspecified abnormalities of gait and mobility: Secondary | ICD-10-CM | POA: Diagnosis not present

## 2021-05-03 DIAGNOSIS — M25671 Stiffness of right ankle, not elsewhere classified: Secondary | ICD-10-CM | POA: Diagnosis not present

## 2021-05-03 DIAGNOSIS — M6259 Muscle wasting and atrophy, not elsewhere classified, multiple sites: Secondary | ICD-10-CM | POA: Diagnosis not present

## 2021-05-03 DIAGNOSIS — M25672 Stiffness of left ankle, not elsewhere classified: Secondary | ICD-10-CM | POA: Diagnosis not present

## 2021-05-03 DIAGNOSIS — M25471 Effusion, right ankle: Secondary | ICD-10-CM | POA: Diagnosis not present

## 2021-05-06 DIAGNOSIS — M25472 Effusion, left ankle: Secondary | ICD-10-CM | POA: Diagnosis not present

## 2021-05-06 DIAGNOSIS — R269 Unspecified abnormalities of gait and mobility: Secondary | ICD-10-CM | POA: Diagnosis not present

## 2021-05-06 DIAGNOSIS — M6259 Muscle wasting and atrophy, not elsewhere classified, multiple sites: Secondary | ICD-10-CM | POA: Diagnosis not present

## 2021-05-06 DIAGNOSIS — M25672 Stiffness of left ankle, not elsewhere classified: Secondary | ICD-10-CM | POA: Diagnosis not present

## 2021-05-06 DIAGNOSIS — M25671 Stiffness of right ankle, not elsewhere classified: Secondary | ICD-10-CM | POA: Diagnosis not present

## 2021-05-06 DIAGNOSIS — M25471 Effusion, right ankle: Secondary | ICD-10-CM | POA: Diagnosis not present

## 2021-05-08 DIAGNOSIS — K819 Cholecystitis, unspecified: Secondary | ICD-10-CM | POA: Diagnosis not present

## 2021-05-10 DIAGNOSIS — M25471 Effusion, right ankle: Secondary | ICD-10-CM | POA: Diagnosis not present

## 2021-05-10 DIAGNOSIS — R269 Unspecified abnormalities of gait and mobility: Secondary | ICD-10-CM | POA: Diagnosis not present

## 2021-05-10 DIAGNOSIS — M25671 Stiffness of right ankle, not elsewhere classified: Secondary | ICD-10-CM | POA: Diagnosis not present

## 2021-05-10 DIAGNOSIS — M25472 Effusion, left ankle: Secondary | ICD-10-CM | POA: Diagnosis not present

## 2021-05-10 DIAGNOSIS — M6259 Muscle wasting and atrophy, not elsewhere classified, multiple sites: Secondary | ICD-10-CM | POA: Diagnosis not present

## 2021-05-10 DIAGNOSIS — M25672 Stiffness of left ankle, not elsewhere classified: Secondary | ICD-10-CM | POA: Diagnosis not present

## 2021-05-13 ENCOUNTER — Ambulatory Visit: Payer: Medicare Other | Admitting: Podiatry

## 2021-05-13 DIAGNOSIS — M6259 Muscle wasting and atrophy, not elsewhere classified, multiple sites: Secondary | ICD-10-CM | POA: Diagnosis not present

## 2021-05-13 DIAGNOSIS — M25471 Effusion, right ankle: Secondary | ICD-10-CM | POA: Diagnosis not present

## 2021-05-13 DIAGNOSIS — M25472 Effusion, left ankle: Secondary | ICD-10-CM | POA: Diagnosis not present

## 2021-05-13 DIAGNOSIS — M25672 Stiffness of left ankle, not elsewhere classified: Secondary | ICD-10-CM | POA: Diagnosis not present

## 2021-05-13 DIAGNOSIS — M25671 Stiffness of right ankle, not elsewhere classified: Secondary | ICD-10-CM | POA: Diagnosis not present

## 2021-05-13 DIAGNOSIS — R269 Unspecified abnormalities of gait and mobility: Secondary | ICD-10-CM | POA: Diagnosis not present

## 2021-05-15 DIAGNOSIS — E041 Nontoxic single thyroid nodule: Secondary | ICD-10-CM | POA: Diagnosis not present

## 2021-05-15 DIAGNOSIS — M47812 Spondylosis without myelopathy or radiculopathy, cervical region: Secondary | ICD-10-CM | POA: Diagnosis not present

## 2021-05-15 DIAGNOSIS — C8338 Diffuse large B-cell lymphoma, lymph nodes of multiple sites: Secondary | ICD-10-CM | POA: Diagnosis not present

## 2021-05-15 DIAGNOSIS — K8689 Other specified diseases of pancreas: Secondary | ICD-10-CM | POA: Diagnosis not present

## 2021-05-15 DIAGNOSIS — K7689 Other specified diseases of liver: Secondary | ICD-10-CM | POA: Diagnosis not present

## 2021-05-15 DIAGNOSIS — M4802 Spinal stenosis, cervical region: Secondary | ICD-10-CM | POA: Diagnosis not present

## 2021-05-15 DIAGNOSIS — C8330 Diffuse large B-cell lymphoma, unspecified site: Secondary | ICD-10-CM | POA: Diagnosis not present

## 2021-05-16 DIAGNOSIS — C8330 Diffuse large B-cell lymphoma, unspecified site: Secondary | ICD-10-CM | POA: Diagnosis not present

## 2021-05-17 DIAGNOSIS — R269 Unspecified abnormalities of gait and mobility: Secondary | ICD-10-CM | POA: Diagnosis not present

## 2021-05-17 DIAGNOSIS — M6259 Muscle wasting and atrophy, not elsewhere classified, multiple sites: Secondary | ICD-10-CM | POA: Diagnosis not present

## 2021-05-17 DIAGNOSIS — M25472 Effusion, left ankle: Secondary | ICD-10-CM | POA: Diagnosis not present

## 2021-05-17 DIAGNOSIS — M25672 Stiffness of left ankle, not elsewhere classified: Secondary | ICD-10-CM | POA: Diagnosis not present

## 2021-05-17 DIAGNOSIS — M25671 Stiffness of right ankle, not elsewhere classified: Secondary | ICD-10-CM | POA: Diagnosis not present

## 2021-05-17 DIAGNOSIS — M25471 Effusion, right ankle: Secondary | ICD-10-CM | POA: Diagnosis not present

## 2021-05-20 DIAGNOSIS — M6259 Muscle wasting and atrophy, not elsewhere classified, multiple sites: Secondary | ICD-10-CM | POA: Diagnosis not present

## 2021-05-20 DIAGNOSIS — M25471 Effusion, right ankle: Secondary | ICD-10-CM | POA: Diagnosis not present

## 2021-05-20 DIAGNOSIS — M25672 Stiffness of left ankle, not elsewhere classified: Secondary | ICD-10-CM | POA: Diagnosis not present

## 2021-05-20 DIAGNOSIS — R269 Unspecified abnormalities of gait and mobility: Secondary | ICD-10-CM | POA: Diagnosis not present

## 2021-05-20 DIAGNOSIS — M25472 Effusion, left ankle: Secondary | ICD-10-CM | POA: Diagnosis not present

## 2021-05-20 DIAGNOSIS — M25671 Stiffness of right ankle, not elsewhere classified: Secondary | ICD-10-CM | POA: Diagnosis not present

## 2021-05-21 DIAGNOSIS — S22088D Other fracture of T11-T12 vertebra, subsequent encounter for fracture with routine healing: Secondary | ICD-10-CM | POA: Diagnosis not present

## 2021-05-21 DIAGNOSIS — M6281 Muscle weakness (generalized): Secondary | ICD-10-CM | POA: Diagnosis not present

## 2021-05-23 DIAGNOSIS — E785 Hyperlipidemia, unspecified: Secondary | ICD-10-CM | POA: Diagnosis not present

## 2021-05-23 DIAGNOSIS — E1169 Type 2 diabetes mellitus with other specified complication: Secondary | ICD-10-CM | POA: Diagnosis not present

## 2021-05-23 DIAGNOSIS — M159 Polyosteoarthritis, unspecified: Secondary | ICD-10-CM | POA: Diagnosis not present

## 2021-05-23 DIAGNOSIS — I10 Essential (primary) hypertension: Secondary | ICD-10-CM | POA: Diagnosis not present

## 2021-05-24 DIAGNOSIS — R269 Unspecified abnormalities of gait and mobility: Secondary | ICD-10-CM | POA: Diagnosis not present

## 2021-05-24 DIAGNOSIS — M25472 Effusion, left ankle: Secondary | ICD-10-CM | POA: Diagnosis not present

## 2021-05-24 DIAGNOSIS — M6259 Muscle wasting and atrophy, not elsewhere classified, multiple sites: Secondary | ICD-10-CM | POA: Diagnosis not present

## 2021-05-24 DIAGNOSIS — M25471 Effusion, right ankle: Secondary | ICD-10-CM | POA: Diagnosis not present

## 2021-05-24 DIAGNOSIS — M25671 Stiffness of right ankle, not elsewhere classified: Secondary | ICD-10-CM | POA: Diagnosis not present

## 2021-05-24 DIAGNOSIS — M25672 Stiffness of left ankle, not elsewhere classified: Secondary | ICD-10-CM | POA: Diagnosis not present

## 2021-05-28 DIAGNOSIS — M25471 Effusion, right ankle: Secondary | ICD-10-CM | POA: Diagnosis not present

## 2021-05-28 DIAGNOSIS — M6259 Muscle wasting and atrophy, not elsewhere classified, multiple sites: Secondary | ICD-10-CM | POA: Diagnosis not present

## 2021-05-28 DIAGNOSIS — M25672 Stiffness of left ankle, not elsewhere classified: Secondary | ICD-10-CM | POA: Diagnosis not present

## 2021-05-28 DIAGNOSIS — R269 Unspecified abnormalities of gait and mobility: Secondary | ICD-10-CM | POA: Diagnosis not present

## 2021-05-28 DIAGNOSIS — M25671 Stiffness of right ankle, not elsewhere classified: Secondary | ICD-10-CM | POA: Diagnosis not present

## 2021-05-28 DIAGNOSIS — M25472 Effusion, left ankle: Secondary | ICD-10-CM | POA: Diagnosis not present

## 2021-05-31 DIAGNOSIS — M25672 Stiffness of left ankle, not elsewhere classified: Secondary | ICD-10-CM | POA: Diagnosis not present

## 2021-05-31 DIAGNOSIS — M25671 Stiffness of right ankle, not elsewhere classified: Secondary | ICD-10-CM | POA: Diagnosis not present

## 2021-05-31 DIAGNOSIS — M6259 Muscle wasting and atrophy, not elsewhere classified, multiple sites: Secondary | ICD-10-CM | POA: Diagnosis not present

## 2021-05-31 DIAGNOSIS — M25472 Effusion, left ankle: Secondary | ICD-10-CM | POA: Diagnosis not present

## 2021-05-31 DIAGNOSIS — M25471 Effusion, right ankle: Secondary | ICD-10-CM | POA: Diagnosis not present

## 2021-05-31 DIAGNOSIS — R269 Unspecified abnormalities of gait and mobility: Secondary | ICD-10-CM | POA: Diagnosis not present

## 2021-06-03 DIAGNOSIS — M25472 Effusion, left ankle: Secondary | ICD-10-CM | POA: Diagnosis not present

## 2021-06-03 DIAGNOSIS — M25471 Effusion, right ankle: Secondary | ICD-10-CM | POA: Diagnosis not present

## 2021-06-03 DIAGNOSIS — R269 Unspecified abnormalities of gait and mobility: Secondary | ICD-10-CM | POA: Diagnosis not present

## 2021-06-03 DIAGNOSIS — M6259 Muscle wasting and atrophy, not elsewhere classified, multiple sites: Secondary | ICD-10-CM | POA: Diagnosis not present

## 2021-06-03 DIAGNOSIS — M25671 Stiffness of right ankle, not elsewhere classified: Secondary | ICD-10-CM | POA: Diagnosis not present

## 2021-06-03 DIAGNOSIS — M25672 Stiffness of left ankle, not elsewhere classified: Secondary | ICD-10-CM | POA: Diagnosis not present

## 2021-06-04 DIAGNOSIS — I10 Essential (primary) hypertension: Secondary | ICD-10-CM | POA: Diagnosis not present

## 2021-06-04 DIAGNOSIS — E1169 Type 2 diabetes mellitus with other specified complication: Secondary | ICD-10-CM | POA: Diagnosis not present

## 2021-06-04 DIAGNOSIS — C8490 Mature T/NK-cell lymphomas, unspecified, unspecified site: Secondary | ICD-10-CM | POA: Diagnosis not present

## 2021-06-04 DIAGNOSIS — E785 Hyperlipidemia, unspecified: Secondary | ICD-10-CM | POA: Diagnosis not present

## 2021-06-04 DIAGNOSIS — R069 Unspecified abnormalities of breathing: Secondary | ICD-10-CM | POA: Diagnosis not present

## 2021-06-04 DIAGNOSIS — R609 Edema, unspecified: Secondary | ICD-10-CM | POA: Diagnosis not present

## 2021-06-06 ENCOUNTER — Ambulatory Visit (INDEPENDENT_AMBULATORY_CARE_PROVIDER_SITE_OTHER): Payer: Medicare Other | Admitting: Podiatry

## 2021-06-06 ENCOUNTER — Other Ambulatory Visit: Payer: Self-pay

## 2021-06-06 DIAGNOSIS — B351 Tinea unguium: Secondary | ICD-10-CM | POA: Diagnosis not present

## 2021-06-06 DIAGNOSIS — M79675 Pain in left toe(s): Secondary | ICD-10-CM

## 2021-06-06 DIAGNOSIS — M79674 Pain in right toe(s): Secondary | ICD-10-CM

## 2021-06-06 DIAGNOSIS — E1149 Type 2 diabetes mellitus with other diabetic neurological complication: Secondary | ICD-10-CM

## 2021-06-07 DIAGNOSIS — M25472 Effusion, left ankle: Secondary | ICD-10-CM | POA: Diagnosis not present

## 2021-06-07 DIAGNOSIS — M6259 Muscle wasting and atrophy, not elsewhere classified, multiple sites: Secondary | ICD-10-CM | POA: Diagnosis not present

## 2021-06-07 DIAGNOSIS — R269 Unspecified abnormalities of gait and mobility: Secondary | ICD-10-CM | POA: Diagnosis not present

## 2021-06-07 DIAGNOSIS — M25471 Effusion, right ankle: Secondary | ICD-10-CM | POA: Diagnosis not present

## 2021-06-07 DIAGNOSIS — M25672 Stiffness of left ankle, not elsewhere classified: Secondary | ICD-10-CM | POA: Diagnosis not present

## 2021-06-07 DIAGNOSIS — M25671 Stiffness of right ankle, not elsewhere classified: Secondary | ICD-10-CM | POA: Diagnosis not present

## 2021-06-10 DIAGNOSIS — M25472 Effusion, left ankle: Secondary | ICD-10-CM | POA: Diagnosis not present

## 2021-06-10 DIAGNOSIS — M25671 Stiffness of right ankle, not elsewhere classified: Secondary | ICD-10-CM | POA: Diagnosis not present

## 2021-06-10 DIAGNOSIS — M25471 Effusion, right ankle: Secondary | ICD-10-CM | POA: Diagnosis not present

## 2021-06-10 DIAGNOSIS — R269 Unspecified abnormalities of gait and mobility: Secondary | ICD-10-CM | POA: Diagnosis not present

## 2021-06-10 DIAGNOSIS — M6259 Muscle wasting and atrophy, not elsewhere classified, multiple sites: Secondary | ICD-10-CM | POA: Diagnosis not present

## 2021-06-10 DIAGNOSIS — M25672 Stiffness of left ankle, not elsewhere classified: Secondary | ICD-10-CM | POA: Diagnosis not present

## 2021-06-11 NOTE — Progress Notes (Signed)
Subjective: 70 y.o. returns the office today for painful, elongated, thickened toenails which she cannot trim herself. Denies any redness or drainage around the nails.  States that she is doing better overall.  She has no new concerns.  Denies any acute changes since last appointment and no new complaints today. Denies any systemic complaints such as fevers, chills, nausea, vomiting.   PCP: Lucianne Lei, MD  Objective: AAO 3, NAD DP/PT pulses palpable, CRT less than 3 seconds Chronic bilateral lower extremity edema present. Nails hypertrophic, dystrophic, elongated, brittle, discolored 10. There is tenderness overlying the nails 1-5 bilaterally. There is no surrounding erythema or drainage along the nail sites.  Chronic incurvation present of the toenails without signs of infection. No areas of tenderness identified otherwise. No open lesions or pre-ulcerative lesions are identified. No pain with calf compression, swelling, warmth, erythema.  Assessment: Patient presents with symptomatic onychomycosis; chronic edema  Plan: -Treatment options including alternatives, risks, complications were discussed -Nails sharply debrided 10 without complication/bleeding. -Continue compression, elevation for the edema. -Discussed daily foot inspection. If there are any changes, to call the office immediately.  -Follow-up in 3 months or sooner if any problems are to arise. In the meantime, encouraged to call the office with any questions, concerns, changes symptoms.  Celesta Gentile, DPM

## 2021-06-14 DIAGNOSIS — R269 Unspecified abnormalities of gait and mobility: Secondary | ICD-10-CM | POA: Diagnosis not present

## 2021-06-14 DIAGNOSIS — M25471 Effusion, right ankle: Secondary | ICD-10-CM | POA: Diagnosis not present

## 2021-06-14 DIAGNOSIS — M25472 Effusion, left ankle: Secondary | ICD-10-CM | POA: Diagnosis not present

## 2021-06-14 DIAGNOSIS — M25672 Stiffness of left ankle, not elsewhere classified: Secondary | ICD-10-CM | POA: Diagnosis not present

## 2021-06-14 DIAGNOSIS — M25671 Stiffness of right ankle, not elsewhere classified: Secondary | ICD-10-CM | POA: Diagnosis not present

## 2021-06-14 DIAGNOSIS — M6259 Muscle wasting and atrophy, not elsewhere classified, multiple sites: Secondary | ICD-10-CM | POA: Diagnosis not present

## 2021-06-17 DIAGNOSIS — M6259 Muscle wasting and atrophy, not elsewhere classified, multiple sites: Secondary | ICD-10-CM | POA: Diagnosis not present

## 2021-06-17 DIAGNOSIS — M25471 Effusion, right ankle: Secondary | ICD-10-CM | POA: Diagnosis not present

## 2021-06-17 DIAGNOSIS — M25672 Stiffness of left ankle, not elsewhere classified: Secondary | ICD-10-CM | POA: Diagnosis not present

## 2021-06-17 DIAGNOSIS — M25671 Stiffness of right ankle, not elsewhere classified: Secondary | ICD-10-CM | POA: Diagnosis not present

## 2021-06-17 DIAGNOSIS — R269 Unspecified abnormalities of gait and mobility: Secondary | ICD-10-CM | POA: Diagnosis not present

## 2021-06-17 DIAGNOSIS — M25472 Effusion, left ankle: Secondary | ICD-10-CM | POA: Diagnosis not present

## 2021-06-21 DIAGNOSIS — M25671 Stiffness of right ankle, not elsewhere classified: Secondary | ICD-10-CM | POA: Diagnosis not present

## 2021-06-21 DIAGNOSIS — M25472 Effusion, left ankle: Secondary | ICD-10-CM | POA: Diagnosis not present

## 2021-06-21 DIAGNOSIS — M25672 Stiffness of left ankle, not elsewhere classified: Secondary | ICD-10-CM | POA: Diagnosis not present

## 2021-06-21 DIAGNOSIS — M25471 Effusion, right ankle: Secondary | ICD-10-CM | POA: Diagnosis not present

## 2021-06-21 DIAGNOSIS — M6259 Muscle wasting and atrophy, not elsewhere classified, multiple sites: Secondary | ICD-10-CM | POA: Diagnosis not present

## 2021-06-21 DIAGNOSIS — R269 Unspecified abnormalities of gait and mobility: Secondary | ICD-10-CM | POA: Diagnosis not present

## 2021-06-24 DIAGNOSIS — S22088D Other fracture of T11-T12 vertebra, subsequent encounter for fracture with routine healing: Secondary | ICD-10-CM | POA: Diagnosis not present

## 2021-06-24 DIAGNOSIS — M25472 Effusion, left ankle: Secondary | ICD-10-CM | POA: Diagnosis not present

## 2021-06-24 DIAGNOSIS — R269 Unspecified abnormalities of gait and mobility: Secondary | ICD-10-CM | POA: Diagnosis not present

## 2021-06-24 DIAGNOSIS — M25471 Effusion, right ankle: Secondary | ICD-10-CM | POA: Diagnosis not present

## 2021-06-24 DIAGNOSIS — M6259 Muscle wasting and atrophy, not elsewhere classified, multiple sites: Secondary | ICD-10-CM | POA: Diagnosis not present

## 2021-06-24 DIAGNOSIS — M25672 Stiffness of left ankle, not elsewhere classified: Secondary | ICD-10-CM | POA: Diagnosis not present

## 2021-06-24 DIAGNOSIS — M25671 Stiffness of right ankle, not elsewhere classified: Secondary | ICD-10-CM | POA: Diagnosis not present

## 2021-06-24 DIAGNOSIS — M6281 Muscle weakness (generalized): Secondary | ICD-10-CM | POA: Diagnosis not present

## 2021-06-28 DIAGNOSIS — M25471 Effusion, right ankle: Secondary | ICD-10-CM | POA: Diagnosis not present

## 2021-06-28 DIAGNOSIS — M25672 Stiffness of left ankle, not elsewhere classified: Secondary | ICD-10-CM | POA: Diagnosis not present

## 2021-06-28 DIAGNOSIS — M25671 Stiffness of right ankle, not elsewhere classified: Secondary | ICD-10-CM | POA: Diagnosis not present

## 2021-06-28 DIAGNOSIS — M6259 Muscle wasting and atrophy, not elsewhere classified, multiple sites: Secondary | ICD-10-CM | POA: Diagnosis not present

## 2021-06-28 DIAGNOSIS — R269 Unspecified abnormalities of gait and mobility: Secondary | ICD-10-CM | POA: Diagnosis not present

## 2021-06-28 DIAGNOSIS — M25472 Effusion, left ankle: Secondary | ICD-10-CM | POA: Diagnosis not present

## 2021-07-01 DIAGNOSIS — M25671 Stiffness of right ankle, not elsewhere classified: Secondary | ICD-10-CM | POA: Diagnosis not present

## 2021-07-01 DIAGNOSIS — M25672 Stiffness of left ankle, not elsewhere classified: Secondary | ICD-10-CM | POA: Diagnosis not present

## 2021-07-01 DIAGNOSIS — R269 Unspecified abnormalities of gait and mobility: Secondary | ICD-10-CM | POA: Diagnosis not present

## 2021-07-01 DIAGNOSIS — M25472 Effusion, left ankle: Secondary | ICD-10-CM | POA: Diagnosis not present

## 2021-07-01 DIAGNOSIS — M6259 Muscle wasting and atrophy, not elsewhere classified, multiple sites: Secondary | ICD-10-CM | POA: Diagnosis not present

## 2021-07-01 DIAGNOSIS — M25471 Effusion, right ankle: Secondary | ICD-10-CM | POA: Diagnosis not present

## 2021-07-05 DIAGNOSIS — M25671 Stiffness of right ankle, not elsewhere classified: Secondary | ICD-10-CM | POA: Diagnosis not present

## 2021-07-05 DIAGNOSIS — M6259 Muscle wasting and atrophy, not elsewhere classified, multiple sites: Secondary | ICD-10-CM | POA: Diagnosis not present

## 2021-07-05 DIAGNOSIS — M25472 Effusion, left ankle: Secondary | ICD-10-CM | POA: Diagnosis not present

## 2021-07-05 DIAGNOSIS — R269 Unspecified abnormalities of gait and mobility: Secondary | ICD-10-CM | POA: Diagnosis not present

## 2021-07-05 DIAGNOSIS — M25672 Stiffness of left ankle, not elsewhere classified: Secondary | ICD-10-CM | POA: Diagnosis not present

## 2021-07-05 DIAGNOSIS — M25471 Effusion, right ankle: Secondary | ICD-10-CM | POA: Diagnosis not present

## 2021-07-08 DIAGNOSIS — M25672 Stiffness of left ankle, not elsewhere classified: Secondary | ICD-10-CM | POA: Diagnosis not present

## 2021-07-08 DIAGNOSIS — M25472 Effusion, left ankle: Secondary | ICD-10-CM | POA: Diagnosis not present

## 2021-07-08 DIAGNOSIS — R269 Unspecified abnormalities of gait and mobility: Secondary | ICD-10-CM | POA: Diagnosis not present

## 2021-07-08 DIAGNOSIS — M25671 Stiffness of right ankle, not elsewhere classified: Secondary | ICD-10-CM | POA: Diagnosis not present

## 2021-07-08 DIAGNOSIS — M25471 Effusion, right ankle: Secondary | ICD-10-CM | POA: Diagnosis not present

## 2021-07-08 DIAGNOSIS — M6259 Muscle wasting and atrophy, not elsewhere classified, multiple sites: Secondary | ICD-10-CM | POA: Diagnosis not present

## 2021-07-12 ENCOUNTER — Other Ambulatory Visit: Payer: Self-pay | Admitting: Podiatry

## 2021-07-12 DIAGNOSIS — M25671 Stiffness of right ankle, not elsewhere classified: Secondary | ICD-10-CM | POA: Diagnosis not present

## 2021-07-12 DIAGNOSIS — R269 Unspecified abnormalities of gait and mobility: Secondary | ICD-10-CM | POA: Diagnosis not present

## 2021-07-12 DIAGNOSIS — M25472 Effusion, left ankle: Secondary | ICD-10-CM | POA: Diagnosis not present

## 2021-07-12 DIAGNOSIS — M25672 Stiffness of left ankle, not elsewhere classified: Secondary | ICD-10-CM | POA: Diagnosis not present

## 2021-07-12 DIAGNOSIS — M25471 Effusion, right ankle: Secondary | ICD-10-CM | POA: Diagnosis not present

## 2021-07-12 DIAGNOSIS — M6259 Muscle wasting and atrophy, not elsewhere classified, multiple sites: Secondary | ICD-10-CM | POA: Diagnosis not present

## 2021-07-12 NOTE — Telephone Encounter (Signed)
Please advise 

## 2021-07-15 DIAGNOSIS — M6259 Muscle wasting and atrophy, not elsewhere classified, multiple sites: Secondary | ICD-10-CM | POA: Diagnosis not present

## 2021-07-15 DIAGNOSIS — M25472 Effusion, left ankle: Secondary | ICD-10-CM | POA: Diagnosis not present

## 2021-07-15 DIAGNOSIS — M25471 Effusion, right ankle: Secondary | ICD-10-CM | POA: Diagnosis not present

## 2021-07-15 DIAGNOSIS — R269 Unspecified abnormalities of gait and mobility: Secondary | ICD-10-CM | POA: Diagnosis not present

## 2021-07-15 DIAGNOSIS — M25672 Stiffness of left ankle, not elsewhere classified: Secondary | ICD-10-CM | POA: Diagnosis not present

## 2021-07-15 DIAGNOSIS — M25671 Stiffness of right ankle, not elsewhere classified: Secondary | ICD-10-CM | POA: Diagnosis not present

## 2021-07-16 DIAGNOSIS — E785 Hyperlipidemia, unspecified: Secondary | ICD-10-CM | POA: Diagnosis not present

## 2021-07-16 DIAGNOSIS — I10 Essential (primary) hypertension: Secondary | ICD-10-CM | POA: Diagnosis not present

## 2021-07-16 DIAGNOSIS — E1169 Type 2 diabetes mellitus with other specified complication: Secondary | ICD-10-CM | POA: Diagnosis not present

## 2021-07-19 DIAGNOSIS — M6259 Muscle wasting and atrophy, not elsewhere classified, multiple sites: Secondary | ICD-10-CM | POA: Diagnosis not present

## 2021-07-19 DIAGNOSIS — M25472 Effusion, left ankle: Secondary | ICD-10-CM | POA: Diagnosis not present

## 2021-07-19 DIAGNOSIS — M25671 Stiffness of right ankle, not elsewhere classified: Secondary | ICD-10-CM | POA: Diagnosis not present

## 2021-07-19 DIAGNOSIS — R269 Unspecified abnormalities of gait and mobility: Secondary | ICD-10-CM | POA: Diagnosis not present

## 2021-07-19 DIAGNOSIS — M25471 Effusion, right ankle: Secondary | ICD-10-CM | POA: Diagnosis not present

## 2021-07-19 DIAGNOSIS — M25672 Stiffness of left ankle, not elsewhere classified: Secondary | ICD-10-CM | POA: Diagnosis not present

## 2021-07-23 DIAGNOSIS — M25471 Effusion, right ankle: Secondary | ICD-10-CM | POA: Diagnosis not present

## 2021-07-23 DIAGNOSIS — M25472 Effusion, left ankle: Secondary | ICD-10-CM | POA: Diagnosis not present

## 2021-07-23 DIAGNOSIS — M25672 Stiffness of left ankle, not elsewhere classified: Secondary | ICD-10-CM | POA: Diagnosis not present

## 2021-07-23 DIAGNOSIS — R269 Unspecified abnormalities of gait and mobility: Secondary | ICD-10-CM | POA: Diagnosis not present

## 2021-07-23 DIAGNOSIS — M25671 Stiffness of right ankle, not elsewhere classified: Secondary | ICD-10-CM | POA: Diagnosis not present

## 2021-07-23 DIAGNOSIS — M6259 Muscle wasting and atrophy, not elsewhere classified, multiple sites: Secondary | ICD-10-CM | POA: Diagnosis not present

## 2021-07-24 DIAGNOSIS — E1169 Type 2 diabetes mellitus with other specified complication: Secondary | ICD-10-CM | POA: Diagnosis not present

## 2021-07-24 DIAGNOSIS — E785 Hyperlipidemia, unspecified: Secondary | ICD-10-CM | POA: Diagnosis not present

## 2021-07-24 DIAGNOSIS — I10 Essential (primary) hypertension: Secondary | ICD-10-CM | POA: Diagnosis not present

## 2021-07-24 DIAGNOSIS — M159 Polyosteoarthritis, unspecified: Secondary | ICD-10-CM | POA: Diagnosis not present

## 2021-07-26 DIAGNOSIS — M25671 Stiffness of right ankle, not elsewhere classified: Secondary | ICD-10-CM | POA: Diagnosis not present

## 2021-07-26 DIAGNOSIS — M25672 Stiffness of left ankle, not elsewhere classified: Secondary | ICD-10-CM | POA: Diagnosis not present

## 2021-07-26 DIAGNOSIS — M25472 Effusion, left ankle: Secondary | ICD-10-CM | POA: Diagnosis not present

## 2021-07-26 DIAGNOSIS — M25471 Effusion, right ankle: Secondary | ICD-10-CM | POA: Diagnosis not present

## 2021-07-26 DIAGNOSIS — R269 Unspecified abnormalities of gait and mobility: Secondary | ICD-10-CM | POA: Diagnosis not present

## 2021-07-26 DIAGNOSIS — M6259 Muscle wasting and atrophy, not elsewhere classified, multiple sites: Secondary | ICD-10-CM | POA: Diagnosis not present

## 2021-07-30 DIAGNOSIS — M25472 Effusion, left ankle: Secondary | ICD-10-CM | POA: Diagnosis not present

## 2021-07-30 DIAGNOSIS — R269 Unspecified abnormalities of gait and mobility: Secondary | ICD-10-CM | POA: Diagnosis not present

## 2021-07-30 DIAGNOSIS — M25671 Stiffness of right ankle, not elsewhere classified: Secondary | ICD-10-CM | POA: Diagnosis not present

## 2021-07-30 DIAGNOSIS — M25672 Stiffness of left ankle, not elsewhere classified: Secondary | ICD-10-CM | POA: Diagnosis not present

## 2021-07-30 DIAGNOSIS — M25471 Effusion, right ankle: Secondary | ICD-10-CM | POA: Diagnosis not present

## 2021-07-30 DIAGNOSIS — M6259 Muscle wasting and atrophy, not elsewhere classified, multiple sites: Secondary | ICD-10-CM | POA: Diagnosis not present

## 2021-08-02 DIAGNOSIS — M25671 Stiffness of right ankle, not elsewhere classified: Secondary | ICD-10-CM | POA: Diagnosis not present

## 2021-08-02 DIAGNOSIS — M25472 Effusion, left ankle: Secondary | ICD-10-CM | POA: Diagnosis not present

## 2021-08-02 DIAGNOSIS — M25471 Effusion, right ankle: Secondary | ICD-10-CM | POA: Diagnosis not present

## 2021-08-02 DIAGNOSIS — R269 Unspecified abnormalities of gait and mobility: Secondary | ICD-10-CM | POA: Diagnosis not present

## 2021-08-02 DIAGNOSIS — M25672 Stiffness of left ankle, not elsewhere classified: Secondary | ICD-10-CM | POA: Diagnosis not present

## 2021-08-02 DIAGNOSIS — M6259 Muscle wasting and atrophy, not elsewhere classified, multiple sites: Secondary | ICD-10-CM | POA: Diagnosis not present

## 2021-08-05 DIAGNOSIS — M6259 Muscle wasting and atrophy, not elsewhere classified, multiple sites: Secondary | ICD-10-CM | POA: Diagnosis not present

## 2021-08-05 DIAGNOSIS — M25672 Stiffness of left ankle, not elsewhere classified: Secondary | ICD-10-CM | POA: Diagnosis not present

## 2021-08-05 DIAGNOSIS — M25471 Effusion, right ankle: Secondary | ICD-10-CM | POA: Diagnosis not present

## 2021-08-05 DIAGNOSIS — M25472 Effusion, left ankle: Secondary | ICD-10-CM | POA: Diagnosis not present

## 2021-08-05 DIAGNOSIS — R269 Unspecified abnormalities of gait and mobility: Secondary | ICD-10-CM | POA: Diagnosis not present

## 2021-08-05 DIAGNOSIS — M25671 Stiffness of right ankle, not elsewhere classified: Secondary | ICD-10-CM | POA: Diagnosis not present

## 2021-08-09 DIAGNOSIS — M25671 Stiffness of right ankle, not elsewhere classified: Secondary | ICD-10-CM | POA: Diagnosis not present

## 2021-08-09 DIAGNOSIS — M25672 Stiffness of left ankle, not elsewhere classified: Secondary | ICD-10-CM | POA: Diagnosis not present

## 2021-08-09 DIAGNOSIS — M25472 Effusion, left ankle: Secondary | ICD-10-CM | POA: Diagnosis not present

## 2021-08-09 DIAGNOSIS — M6259 Muscle wasting and atrophy, not elsewhere classified, multiple sites: Secondary | ICD-10-CM | POA: Diagnosis not present

## 2021-08-09 DIAGNOSIS — M25471 Effusion, right ankle: Secondary | ICD-10-CM | POA: Diagnosis not present

## 2021-08-09 DIAGNOSIS — R269 Unspecified abnormalities of gait and mobility: Secondary | ICD-10-CM | POA: Diagnosis not present

## 2021-08-12 ENCOUNTER — Telehealth: Payer: Self-pay | Admitting: Podiatry

## 2021-08-12 DIAGNOSIS — M25471 Effusion, right ankle: Secondary | ICD-10-CM | POA: Diagnosis not present

## 2021-08-12 DIAGNOSIS — M25672 Stiffness of left ankle, not elsewhere classified: Secondary | ICD-10-CM | POA: Diagnosis not present

## 2021-08-12 DIAGNOSIS — R269 Unspecified abnormalities of gait and mobility: Secondary | ICD-10-CM | POA: Diagnosis not present

## 2021-08-12 DIAGNOSIS — M25671 Stiffness of right ankle, not elsewhere classified: Secondary | ICD-10-CM | POA: Diagnosis not present

## 2021-08-12 DIAGNOSIS — M25472 Effusion, left ankle: Secondary | ICD-10-CM | POA: Diagnosis not present

## 2021-08-12 DIAGNOSIS — M6259 Muscle wasting and atrophy, not elsewhere classified, multiple sites: Secondary | ICD-10-CM | POA: Diagnosis not present

## 2021-08-12 NOTE — Telephone Encounter (Signed)
Pt left message for someone to call her back about shoes.  I returned call and she is wanting to proceed with getting shoes from our office like she got a few years ago.   Upon checking in 2020 she got the moore balance shoes in size 10 w. She stated she did not get the shoes from the other place as she was told over 600.00 for the shoes and she cannot afford that. She is scheduled for 9.27

## 2021-08-15 DIAGNOSIS — C8330 Diffuse large B-cell lymphoma, unspecified site: Secondary | ICD-10-CM | POA: Diagnosis not present

## 2021-08-16 DIAGNOSIS — R269 Unspecified abnormalities of gait and mobility: Secondary | ICD-10-CM | POA: Diagnosis not present

## 2021-08-16 DIAGNOSIS — M6259 Muscle wasting and atrophy, not elsewhere classified, multiple sites: Secondary | ICD-10-CM | POA: Diagnosis not present

## 2021-08-16 DIAGNOSIS — M25671 Stiffness of right ankle, not elsewhere classified: Secondary | ICD-10-CM | POA: Diagnosis not present

## 2021-08-16 DIAGNOSIS — M25471 Effusion, right ankle: Secondary | ICD-10-CM | POA: Diagnosis not present

## 2021-08-16 DIAGNOSIS — M25472 Effusion, left ankle: Secondary | ICD-10-CM | POA: Diagnosis not present

## 2021-08-16 DIAGNOSIS — M25672 Stiffness of left ankle, not elsewhere classified: Secondary | ICD-10-CM | POA: Diagnosis not present

## 2021-08-19 DIAGNOSIS — M25672 Stiffness of left ankle, not elsewhere classified: Secondary | ICD-10-CM | POA: Diagnosis not present

## 2021-08-19 DIAGNOSIS — M25471 Effusion, right ankle: Secondary | ICD-10-CM | POA: Diagnosis not present

## 2021-08-19 DIAGNOSIS — M25671 Stiffness of right ankle, not elsewhere classified: Secondary | ICD-10-CM | POA: Diagnosis not present

## 2021-08-19 DIAGNOSIS — R269 Unspecified abnormalities of gait and mobility: Secondary | ICD-10-CM | POA: Diagnosis not present

## 2021-08-19 DIAGNOSIS — M25472 Effusion, left ankle: Secondary | ICD-10-CM | POA: Diagnosis not present

## 2021-08-19 DIAGNOSIS — M6259 Muscle wasting and atrophy, not elsewhere classified, multiple sites: Secondary | ICD-10-CM | POA: Diagnosis not present

## 2021-08-20 ENCOUNTER — Ambulatory Visit (INDEPENDENT_AMBULATORY_CARE_PROVIDER_SITE_OTHER): Payer: Medicare Other

## 2021-08-20 ENCOUNTER — Other Ambulatory Visit: Payer: Self-pay

## 2021-08-20 DIAGNOSIS — E1149 Type 2 diabetes mellitus with other diabetic neurological complication: Secondary | ICD-10-CM

## 2021-08-20 NOTE — Progress Notes (Signed)
Patient in office today for diabetic shoe measurement and custom inserts. Patient diabetes is currently treated by Lucianne Lei, MD. Patient shoe size is 10 xx-wide women's. Due to swelling in the feet patient would like to order the same shoe which is the Apex balance shoe [id number] at this time. Advised patient the office will call when the shoes and inserts are available for pick-up. Patient verbalized understanding. Medicaid ABN signed during this visit.

## 2021-08-21 DIAGNOSIS — M6281 Muscle weakness (generalized): Secondary | ICD-10-CM | POA: Diagnosis not present

## 2021-08-21 DIAGNOSIS — S22088D Other fracture of T11-T12 vertebra, subsequent encounter for fracture with routine healing: Secondary | ICD-10-CM | POA: Diagnosis not present

## 2021-08-23 DIAGNOSIS — I10 Essential (primary) hypertension: Secondary | ICD-10-CM | POA: Diagnosis not present

## 2021-08-23 DIAGNOSIS — M159 Polyosteoarthritis, unspecified: Secondary | ICD-10-CM | POA: Diagnosis not present

## 2021-08-23 DIAGNOSIS — E1169 Type 2 diabetes mellitus with other specified complication: Secondary | ICD-10-CM | POA: Diagnosis not present

## 2021-08-23 DIAGNOSIS — E785 Hyperlipidemia, unspecified: Secondary | ICD-10-CM | POA: Diagnosis not present

## 2021-08-26 DIAGNOSIS — M25672 Stiffness of left ankle, not elsewhere classified: Secondary | ICD-10-CM | POA: Diagnosis not present

## 2021-08-26 DIAGNOSIS — M25472 Effusion, left ankle: Secondary | ICD-10-CM | POA: Diagnosis not present

## 2021-08-26 DIAGNOSIS — M6259 Muscle wasting and atrophy, not elsewhere classified, multiple sites: Secondary | ICD-10-CM | POA: Diagnosis not present

## 2021-08-26 DIAGNOSIS — M25671 Stiffness of right ankle, not elsewhere classified: Secondary | ICD-10-CM | POA: Diagnosis not present

## 2021-08-26 DIAGNOSIS — M25471 Effusion, right ankle: Secondary | ICD-10-CM | POA: Diagnosis not present

## 2021-08-26 DIAGNOSIS — R269 Unspecified abnormalities of gait and mobility: Secondary | ICD-10-CM | POA: Diagnosis not present

## 2021-08-27 DIAGNOSIS — E785 Hyperlipidemia, unspecified: Secondary | ICD-10-CM | POA: Diagnosis not present

## 2021-08-27 DIAGNOSIS — R269 Unspecified abnormalities of gait and mobility: Secondary | ICD-10-CM | POA: Diagnosis not present

## 2021-08-27 DIAGNOSIS — I10 Essential (primary) hypertension: Secondary | ICD-10-CM | POA: Diagnosis not present

## 2021-08-30 DIAGNOSIS — M25472 Effusion, left ankle: Secondary | ICD-10-CM | POA: Diagnosis not present

## 2021-08-30 DIAGNOSIS — M25471 Effusion, right ankle: Secondary | ICD-10-CM | POA: Diagnosis not present

## 2021-08-30 DIAGNOSIS — M6259 Muscle wasting and atrophy, not elsewhere classified, multiple sites: Secondary | ICD-10-CM | POA: Diagnosis not present

## 2021-08-30 DIAGNOSIS — M25672 Stiffness of left ankle, not elsewhere classified: Secondary | ICD-10-CM | POA: Diagnosis not present

## 2021-08-30 DIAGNOSIS — R269 Unspecified abnormalities of gait and mobility: Secondary | ICD-10-CM | POA: Diagnosis not present

## 2021-08-30 DIAGNOSIS — M25671 Stiffness of right ankle, not elsewhere classified: Secondary | ICD-10-CM | POA: Diagnosis not present

## 2021-09-02 DIAGNOSIS — M6259 Muscle wasting and atrophy, not elsewhere classified, multiple sites: Secondary | ICD-10-CM | POA: Diagnosis not present

## 2021-09-02 DIAGNOSIS — M25471 Effusion, right ankle: Secondary | ICD-10-CM | POA: Diagnosis not present

## 2021-09-02 DIAGNOSIS — M25472 Effusion, left ankle: Secondary | ICD-10-CM | POA: Diagnosis not present

## 2021-09-02 DIAGNOSIS — M25672 Stiffness of left ankle, not elsewhere classified: Secondary | ICD-10-CM | POA: Diagnosis not present

## 2021-09-02 DIAGNOSIS — R269 Unspecified abnormalities of gait and mobility: Secondary | ICD-10-CM | POA: Diagnosis not present

## 2021-09-02 DIAGNOSIS — M25671 Stiffness of right ankle, not elsewhere classified: Secondary | ICD-10-CM | POA: Diagnosis not present

## 2021-09-06 DIAGNOSIS — M6259 Muscle wasting and atrophy, not elsewhere classified, multiple sites: Secondary | ICD-10-CM | POA: Diagnosis not present

## 2021-09-06 DIAGNOSIS — M25671 Stiffness of right ankle, not elsewhere classified: Secondary | ICD-10-CM | POA: Diagnosis not present

## 2021-09-06 DIAGNOSIS — M25471 Effusion, right ankle: Secondary | ICD-10-CM | POA: Diagnosis not present

## 2021-09-06 DIAGNOSIS — R269 Unspecified abnormalities of gait and mobility: Secondary | ICD-10-CM | POA: Diagnosis not present

## 2021-09-06 DIAGNOSIS — M25472 Effusion, left ankle: Secondary | ICD-10-CM | POA: Diagnosis not present

## 2021-09-06 DIAGNOSIS — M25672 Stiffness of left ankle, not elsewhere classified: Secondary | ICD-10-CM | POA: Diagnosis not present

## 2021-09-09 ENCOUNTER — Other Ambulatory Visit: Payer: Self-pay

## 2021-09-09 ENCOUNTER — Ambulatory Visit (INDEPENDENT_AMBULATORY_CARE_PROVIDER_SITE_OTHER): Payer: Medicare Other | Admitting: Podiatry

## 2021-09-09 DIAGNOSIS — M25671 Stiffness of right ankle, not elsewhere classified: Secondary | ICD-10-CM | POA: Diagnosis not present

## 2021-09-09 DIAGNOSIS — M25672 Stiffness of left ankle, not elsewhere classified: Secondary | ICD-10-CM | POA: Diagnosis not present

## 2021-09-09 DIAGNOSIS — M25472 Effusion, left ankle: Secondary | ICD-10-CM | POA: Diagnosis not present

## 2021-09-09 DIAGNOSIS — B351 Tinea unguium: Secondary | ICD-10-CM | POA: Diagnosis not present

## 2021-09-09 DIAGNOSIS — M79675 Pain in left toe(s): Secondary | ICD-10-CM | POA: Diagnosis not present

## 2021-09-09 DIAGNOSIS — M79674 Pain in right toe(s): Secondary | ICD-10-CM | POA: Diagnosis not present

## 2021-09-09 DIAGNOSIS — M6259 Muscle wasting and atrophy, not elsewhere classified, multiple sites: Secondary | ICD-10-CM | POA: Diagnosis not present

## 2021-09-09 DIAGNOSIS — E1149 Type 2 diabetes mellitus with other diabetic neurological complication: Secondary | ICD-10-CM | POA: Diagnosis not present

## 2021-09-09 DIAGNOSIS — M25471 Effusion, right ankle: Secondary | ICD-10-CM | POA: Diagnosis not present

## 2021-09-09 DIAGNOSIS — R269 Unspecified abnormalities of gait and mobility: Secondary | ICD-10-CM | POA: Diagnosis not present

## 2021-09-10 NOTE — Progress Notes (Signed)
Subjective: 70 y.o. returns the office today for painful, elongated, thickened toenails which she cannot trim herself. Denies any redness or drainage around the nails.  Overall states that she has been doing well.  Denies any systemic complaints such as fevers, chills, nausea, vomiting.   PCP: Lucianne Lei, MD  Objective: AAO 3, NAD DP/PT pulses palpable, CRT less than 3 seconds Chronic bilateral lower extremity edema present. Nails hypertrophic, dystrophic, elongated, brittle, discolored 10. There is tenderness overlying the nails 1-5 bilaterally. There is no surrounding erythema or drainage along the nail sites.  Chronic incurvation present of the toenails without signs of infection. No areas of tenderness identified otherwise. No open lesions or pre-ulcerative lesions are identified. No pain with calf compression, swelling, warmth, erythema.  Assessment: Patient presents with symptomatic onychomycosis; chronic edema  Plan: -Treatment options including alternatives, risks, complications were discussed -Nails sharply debrided 10 without complication/bleeding. -Continue compression, elevation for the edema. -Discussed daily foot inspection. If there are any changes, to call the office immediately.  -Follow-up in 3 months or sooner if any problems are to arise. In the meantime, encouraged to call the office with any questions, concerns, changes symptoms.  Celesta Gentile, DPM

## 2021-09-13 DIAGNOSIS — M25471 Effusion, right ankle: Secondary | ICD-10-CM | POA: Diagnosis not present

## 2021-09-13 DIAGNOSIS — M25472 Effusion, left ankle: Secondary | ICD-10-CM | POA: Diagnosis not present

## 2021-09-13 DIAGNOSIS — M25672 Stiffness of left ankle, not elsewhere classified: Secondary | ICD-10-CM | POA: Diagnosis not present

## 2021-09-13 DIAGNOSIS — R269 Unspecified abnormalities of gait and mobility: Secondary | ICD-10-CM | POA: Diagnosis not present

## 2021-09-13 DIAGNOSIS — M6259 Muscle wasting and atrophy, not elsewhere classified, multiple sites: Secondary | ICD-10-CM | POA: Diagnosis not present

## 2021-09-13 DIAGNOSIS — M25671 Stiffness of right ankle, not elsewhere classified: Secondary | ICD-10-CM | POA: Diagnosis not present

## 2021-09-16 DIAGNOSIS — M25472 Effusion, left ankle: Secondary | ICD-10-CM | POA: Diagnosis not present

## 2021-09-16 DIAGNOSIS — M6259 Muscle wasting and atrophy, not elsewhere classified, multiple sites: Secondary | ICD-10-CM | POA: Diagnosis not present

## 2021-09-16 DIAGNOSIS — M25671 Stiffness of right ankle, not elsewhere classified: Secondary | ICD-10-CM | POA: Diagnosis not present

## 2021-09-16 DIAGNOSIS — R269 Unspecified abnormalities of gait and mobility: Secondary | ICD-10-CM | POA: Diagnosis not present

## 2021-09-16 DIAGNOSIS — M25471 Effusion, right ankle: Secondary | ICD-10-CM | POA: Diagnosis not present

## 2021-09-16 DIAGNOSIS — M25672 Stiffness of left ankle, not elsewhere classified: Secondary | ICD-10-CM | POA: Diagnosis not present

## 2021-09-20 DIAGNOSIS — S22088D Other fracture of T11-T12 vertebra, subsequent encounter for fracture with routine healing: Secondary | ICD-10-CM | POA: Diagnosis not present

## 2021-09-20 DIAGNOSIS — M25471 Effusion, right ankle: Secondary | ICD-10-CM | POA: Diagnosis not present

## 2021-09-20 DIAGNOSIS — M6281 Muscle weakness (generalized): Secondary | ICD-10-CM | POA: Diagnosis not present

## 2021-09-20 DIAGNOSIS — M25672 Stiffness of left ankle, not elsewhere classified: Secondary | ICD-10-CM | POA: Diagnosis not present

## 2021-09-20 DIAGNOSIS — R269 Unspecified abnormalities of gait and mobility: Secondary | ICD-10-CM | POA: Diagnosis not present

## 2021-09-20 DIAGNOSIS — M25472 Effusion, left ankle: Secondary | ICD-10-CM | POA: Diagnosis not present

## 2021-09-20 DIAGNOSIS — M6259 Muscle wasting and atrophy, not elsewhere classified, multiple sites: Secondary | ICD-10-CM | POA: Diagnosis not present

## 2021-09-20 DIAGNOSIS — M25671 Stiffness of right ankle, not elsewhere classified: Secondary | ICD-10-CM | POA: Diagnosis not present

## 2021-09-23 DIAGNOSIS — R269 Unspecified abnormalities of gait and mobility: Secondary | ICD-10-CM | POA: Diagnosis not present

## 2021-09-23 DIAGNOSIS — M25472 Effusion, left ankle: Secondary | ICD-10-CM | POA: Diagnosis not present

## 2021-09-23 DIAGNOSIS — M6259 Muscle wasting and atrophy, not elsewhere classified, multiple sites: Secondary | ICD-10-CM | POA: Diagnosis not present

## 2021-09-23 DIAGNOSIS — M25672 Stiffness of left ankle, not elsewhere classified: Secondary | ICD-10-CM | POA: Diagnosis not present

## 2021-09-23 DIAGNOSIS — M25471 Effusion, right ankle: Secondary | ICD-10-CM | POA: Diagnosis not present

## 2021-09-23 DIAGNOSIS — M25671 Stiffness of right ankle, not elsewhere classified: Secondary | ICD-10-CM | POA: Diagnosis not present

## 2021-09-26 ENCOUNTER — Telehealth: Payer: Self-pay | Admitting: Podiatry

## 2021-09-26 NOTE — Telephone Encounter (Signed)
Pt called asking about her diabetic shoes. I explained we have faxed the needed documents to the pcp multiple times and have not received it back. She said it hurts her feelings that she has not gotten this information before that she has called several times and never been told this information.This is the first time I have talked to pt about it since she was measured. She then proceeded to say you have not ordered my shoes and I told her the shoes were ordered they just have not sent them due to not receiving the documents.  She asked how much for the shoes if she paid for them and I told her 250. For 1 pr of shoes and 1 pr of custom inserts. She said that is ridiculous. She asked what could she do to get her shoes and I told her to call the pcp office.

## 2021-09-27 DIAGNOSIS — R269 Unspecified abnormalities of gait and mobility: Secondary | ICD-10-CM | POA: Diagnosis not present

## 2021-09-27 DIAGNOSIS — M25472 Effusion, left ankle: Secondary | ICD-10-CM | POA: Diagnosis not present

## 2021-09-27 DIAGNOSIS — M6259 Muscle wasting and atrophy, not elsewhere classified, multiple sites: Secondary | ICD-10-CM | POA: Diagnosis not present

## 2021-09-27 DIAGNOSIS — M25671 Stiffness of right ankle, not elsewhere classified: Secondary | ICD-10-CM | POA: Diagnosis not present

## 2021-09-27 DIAGNOSIS — M25471 Effusion, right ankle: Secondary | ICD-10-CM | POA: Diagnosis not present

## 2021-09-27 DIAGNOSIS — M25672 Stiffness of left ankle, not elsewhere classified: Secondary | ICD-10-CM | POA: Diagnosis not present

## 2021-09-30 DIAGNOSIS — M25671 Stiffness of right ankle, not elsewhere classified: Secondary | ICD-10-CM | POA: Diagnosis not present

## 2021-09-30 DIAGNOSIS — M25471 Effusion, right ankle: Secondary | ICD-10-CM | POA: Diagnosis not present

## 2021-09-30 DIAGNOSIS — M25672 Stiffness of left ankle, not elsewhere classified: Secondary | ICD-10-CM | POA: Diagnosis not present

## 2021-09-30 DIAGNOSIS — M6259 Muscle wasting and atrophy, not elsewhere classified, multiple sites: Secondary | ICD-10-CM | POA: Diagnosis not present

## 2021-09-30 DIAGNOSIS — R269 Unspecified abnormalities of gait and mobility: Secondary | ICD-10-CM | POA: Diagnosis not present

## 2021-09-30 DIAGNOSIS — M25472 Effusion, left ankle: Secondary | ICD-10-CM | POA: Diagnosis not present

## 2021-10-04 DIAGNOSIS — M25672 Stiffness of left ankle, not elsewhere classified: Secondary | ICD-10-CM | POA: Diagnosis not present

## 2021-10-04 DIAGNOSIS — M25471 Effusion, right ankle: Secondary | ICD-10-CM | POA: Diagnosis not present

## 2021-10-04 DIAGNOSIS — M25472 Effusion, left ankle: Secondary | ICD-10-CM | POA: Diagnosis not present

## 2021-10-04 DIAGNOSIS — R269 Unspecified abnormalities of gait and mobility: Secondary | ICD-10-CM | POA: Diagnosis not present

## 2021-10-04 DIAGNOSIS — M25671 Stiffness of right ankle, not elsewhere classified: Secondary | ICD-10-CM | POA: Diagnosis not present

## 2021-10-04 DIAGNOSIS — M6259 Muscle wasting and atrophy, not elsewhere classified, multiple sites: Secondary | ICD-10-CM | POA: Diagnosis not present

## 2021-10-07 DIAGNOSIS — M25472 Effusion, left ankle: Secondary | ICD-10-CM | POA: Diagnosis not present

## 2021-10-07 DIAGNOSIS — M25672 Stiffness of left ankle, not elsewhere classified: Secondary | ICD-10-CM | POA: Diagnosis not present

## 2021-10-07 DIAGNOSIS — M25471 Effusion, right ankle: Secondary | ICD-10-CM | POA: Diagnosis not present

## 2021-10-07 DIAGNOSIS — R269 Unspecified abnormalities of gait and mobility: Secondary | ICD-10-CM | POA: Diagnosis not present

## 2021-10-07 DIAGNOSIS — M25671 Stiffness of right ankle, not elsewhere classified: Secondary | ICD-10-CM | POA: Diagnosis not present

## 2021-10-07 DIAGNOSIS — M6259 Muscle wasting and atrophy, not elsewhere classified, multiple sites: Secondary | ICD-10-CM | POA: Diagnosis not present

## 2021-10-08 DIAGNOSIS — I10 Essential (primary) hypertension: Secondary | ICD-10-CM | POA: Diagnosis not present

## 2021-10-11 DIAGNOSIS — M25472 Effusion, left ankle: Secondary | ICD-10-CM | POA: Diagnosis not present

## 2021-10-11 DIAGNOSIS — M6259 Muscle wasting and atrophy, not elsewhere classified, multiple sites: Secondary | ICD-10-CM | POA: Diagnosis not present

## 2021-10-11 DIAGNOSIS — R269 Unspecified abnormalities of gait and mobility: Secondary | ICD-10-CM | POA: Diagnosis not present

## 2021-10-11 DIAGNOSIS — M25471 Effusion, right ankle: Secondary | ICD-10-CM | POA: Diagnosis not present

## 2021-10-11 DIAGNOSIS — M25672 Stiffness of left ankle, not elsewhere classified: Secondary | ICD-10-CM | POA: Diagnosis not present

## 2021-10-11 DIAGNOSIS — M25671 Stiffness of right ankle, not elsewhere classified: Secondary | ICD-10-CM | POA: Diagnosis not present

## 2021-10-14 DIAGNOSIS — M25471 Effusion, right ankle: Secondary | ICD-10-CM | POA: Diagnosis not present

## 2021-10-14 DIAGNOSIS — M25472 Effusion, left ankle: Secondary | ICD-10-CM | POA: Diagnosis not present

## 2021-10-14 DIAGNOSIS — R269 Unspecified abnormalities of gait and mobility: Secondary | ICD-10-CM | POA: Diagnosis not present

## 2021-10-14 DIAGNOSIS — M25671 Stiffness of right ankle, not elsewhere classified: Secondary | ICD-10-CM | POA: Diagnosis not present

## 2021-10-14 DIAGNOSIS — M6259 Muscle wasting and atrophy, not elsewhere classified, multiple sites: Secondary | ICD-10-CM | POA: Diagnosis not present

## 2021-10-14 DIAGNOSIS — M25672 Stiffness of left ankle, not elsewhere classified: Secondary | ICD-10-CM | POA: Diagnosis not present

## 2021-10-18 DIAGNOSIS — M25672 Stiffness of left ankle, not elsewhere classified: Secondary | ICD-10-CM | POA: Diagnosis not present

## 2021-10-18 DIAGNOSIS — M25471 Effusion, right ankle: Secondary | ICD-10-CM | POA: Diagnosis not present

## 2021-10-18 DIAGNOSIS — M25671 Stiffness of right ankle, not elsewhere classified: Secondary | ICD-10-CM | POA: Diagnosis not present

## 2021-10-18 DIAGNOSIS — R269 Unspecified abnormalities of gait and mobility: Secondary | ICD-10-CM | POA: Diagnosis not present

## 2021-10-18 DIAGNOSIS — M6259 Muscle wasting and atrophy, not elsewhere classified, multiple sites: Secondary | ICD-10-CM | POA: Diagnosis not present

## 2021-10-18 DIAGNOSIS — M25472 Effusion, left ankle: Secondary | ICD-10-CM | POA: Diagnosis not present

## 2021-10-21 DIAGNOSIS — R269 Unspecified abnormalities of gait and mobility: Secondary | ICD-10-CM | POA: Diagnosis not present

## 2021-10-21 DIAGNOSIS — M25671 Stiffness of right ankle, not elsewhere classified: Secondary | ICD-10-CM | POA: Diagnosis not present

## 2021-10-21 DIAGNOSIS — M6259 Muscle wasting and atrophy, not elsewhere classified, multiple sites: Secondary | ICD-10-CM | POA: Diagnosis not present

## 2021-10-21 DIAGNOSIS — S22088D Other fracture of T11-T12 vertebra, subsequent encounter for fracture with routine healing: Secondary | ICD-10-CM | POA: Diagnosis not present

## 2021-10-21 DIAGNOSIS — M25471 Effusion, right ankle: Secondary | ICD-10-CM | POA: Diagnosis not present

## 2021-10-21 DIAGNOSIS — M6281 Muscle weakness (generalized): Secondary | ICD-10-CM | POA: Diagnosis not present

## 2021-10-21 DIAGNOSIS — M25672 Stiffness of left ankle, not elsewhere classified: Secondary | ICD-10-CM | POA: Diagnosis not present

## 2021-10-21 DIAGNOSIS — M25472 Effusion, left ankle: Secondary | ICD-10-CM | POA: Diagnosis not present

## 2021-10-23 DIAGNOSIS — M159 Polyosteoarthritis, unspecified: Secondary | ICD-10-CM | POA: Diagnosis not present

## 2021-10-23 DIAGNOSIS — I10 Essential (primary) hypertension: Secondary | ICD-10-CM | POA: Diagnosis not present

## 2021-10-23 DIAGNOSIS — E1169 Type 2 diabetes mellitus with other specified complication: Secondary | ICD-10-CM | POA: Diagnosis not present

## 2021-10-25 DIAGNOSIS — M25471 Effusion, right ankle: Secondary | ICD-10-CM | POA: Diagnosis not present

## 2021-10-25 DIAGNOSIS — M6259 Muscle wasting and atrophy, not elsewhere classified, multiple sites: Secondary | ICD-10-CM | POA: Diagnosis not present

## 2021-10-25 DIAGNOSIS — M25472 Effusion, left ankle: Secondary | ICD-10-CM | POA: Diagnosis not present

## 2021-10-25 DIAGNOSIS — M25671 Stiffness of right ankle, not elsewhere classified: Secondary | ICD-10-CM | POA: Diagnosis not present

## 2021-10-25 DIAGNOSIS — R269 Unspecified abnormalities of gait and mobility: Secondary | ICD-10-CM | POA: Diagnosis not present

## 2021-10-25 DIAGNOSIS — M25672 Stiffness of left ankle, not elsewhere classified: Secondary | ICD-10-CM | POA: Diagnosis not present

## 2021-10-28 ENCOUNTER — Telehealth: Payer: Self-pay | Admitting: Podiatry

## 2021-10-28 DIAGNOSIS — M25672 Stiffness of left ankle, not elsewhere classified: Secondary | ICD-10-CM | POA: Diagnosis not present

## 2021-10-28 DIAGNOSIS — R269 Unspecified abnormalities of gait and mobility: Secondary | ICD-10-CM | POA: Diagnosis not present

## 2021-10-28 DIAGNOSIS — M25472 Effusion, left ankle: Secondary | ICD-10-CM | POA: Diagnosis not present

## 2021-10-28 DIAGNOSIS — M6259 Muscle wasting and atrophy, not elsewhere classified, multiple sites: Secondary | ICD-10-CM | POA: Diagnosis not present

## 2021-10-28 DIAGNOSIS — M25671 Stiffness of right ankle, not elsewhere classified: Secondary | ICD-10-CM | POA: Diagnosis not present

## 2021-10-28 DIAGNOSIS — M25471 Effusion, right ankle: Secondary | ICD-10-CM | POA: Diagnosis not present

## 2021-10-28 NOTE — Telephone Encounter (Signed)
Received voicemail from Donnamarie Rossetti @ [facility] asking for a call back about pt getting diabetic shoes.   I returned call and explained what was needed and she stated there fax machine has been down and was just fixed. She asked if I would refax it and I told her sure and confirmed fax number put attention Bethena Roys @ [facility].

## 2021-11-01 DIAGNOSIS — M25671 Stiffness of right ankle, not elsewhere classified: Secondary | ICD-10-CM | POA: Diagnosis not present

## 2021-11-01 DIAGNOSIS — R269 Unspecified abnormalities of gait and mobility: Secondary | ICD-10-CM | POA: Diagnosis not present

## 2021-11-01 DIAGNOSIS — M6259 Muscle wasting and atrophy, not elsewhere classified, multiple sites: Secondary | ICD-10-CM | POA: Diagnosis not present

## 2021-11-01 DIAGNOSIS — M25472 Effusion, left ankle: Secondary | ICD-10-CM | POA: Diagnosis not present

## 2021-11-01 DIAGNOSIS — M25471 Effusion, right ankle: Secondary | ICD-10-CM | POA: Diagnosis not present

## 2021-11-01 DIAGNOSIS — M25672 Stiffness of left ankle, not elsewhere classified: Secondary | ICD-10-CM | POA: Diagnosis not present

## 2021-11-04 DIAGNOSIS — M25671 Stiffness of right ankle, not elsewhere classified: Secondary | ICD-10-CM | POA: Diagnosis not present

## 2021-11-04 DIAGNOSIS — M25672 Stiffness of left ankle, not elsewhere classified: Secondary | ICD-10-CM | POA: Diagnosis not present

## 2021-11-04 DIAGNOSIS — M25471 Effusion, right ankle: Secondary | ICD-10-CM | POA: Diagnosis not present

## 2021-11-04 DIAGNOSIS — R269 Unspecified abnormalities of gait and mobility: Secondary | ICD-10-CM | POA: Diagnosis not present

## 2021-11-04 DIAGNOSIS — M6259 Muscle wasting and atrophy, not elsewhere classified, multiple sites: Secondary | ICD-10-CM | POA: Diagnosis not present

## 2021-11-04 DIAGNOSIS — M25472 Effusion, left ankle: Secondary | ICD-10-CM | POA: Diagnosis not present

## 2021-11-07 DIAGNOSIS — M25672 Stiffness of left ankle, not elsewhere classified: Secondary | ICD-10-CM | POA: Diagnosis not present

## 2021-11-07 DIAGNOSIS — R269 Unspecified abnormalities of gait and mobility: Secondary | ICD-10-CM | POA: Diagnosis not present

## 2021-11-07 DIAGNOSIS — M25671 Stiffness of right ankle, not elsewhere classified: Secondary | ICD-10-CM | POA: Diagnosis not present

## 2021-11-07 DIAGNOSIS — M6259 Muscle wasting and atrophy, not elsewhere classified, multiple sites: Secondary | ICD-10-CM | POA: Diagnosis not present

## 2021-11-07 DIAGNOSIS — M25471 Effusion, right ankle: Secondary | ICD-10-CM | POA: Diagnosis not present

## 2021-11-07 DIAGNOSIS — M25472 Effusion, left ankle: Secondary | ICD-10-CM | POA: Diagnosis not present

## 2021-11-11 DIAGNOSIS — R269 Unspecified abnormalities of gait and mobility: Secondary | ICD-10-CM | POA: Diagnosis not present

## 2021-11-11 DIAGNOSIS — M25472 Effusion, left ankle: Secondary | ICD-10-CM | POA: Diagnosis not present

## 2021-11-11 DIAGNOSIS — M25672 Stiffness of left ankle, not elsewhere classified: Secondary | ICD-10-CM | POA: Diagnosis not present

## 2021-11-11 DIAGNOSIS — M25471 Effusion, right ankle: Secondary | ICD-10-CM | POA: Diagnosis not present

## 2021-11-11 DIAGNOSIS — M25671 Stiffness of right ankle, not elsewhere classified: Secondary | ICD-10-CM | POA: Diagnosis not present

## 2021-11-11 DIAGNOSIS — M6259 Muscle wasting and atrophy, not elsewhere classified, multiple sites: Secondary | ICD-10-CM | POA: Diagnosis not present

## 2021-11-14 DIAGNOSIS — R911 Solitary pulmonary nodule: Secondary | ICD-10-CM | POA: Diagnosis not present

## 2021-11-14 DIAGNOSIS — C8331 Diffuse large B-cell lymphoma, lymph nodes of head, face, and neck: Secondary | ICD-10-CM | POA: Diagnosis not present

## 2021-11-14 DIAGNOSIS — C8330 Diffuse large B-cell lymphoma, unspecified site: Secondary | ICD-10-CM | POA: Diagnosis not present

## 2021-11-19 DIAGNOSIS — R269 Unspecified abnormalities of gait and mobility: Secondary | ICD-10-CM | POA: Diagnosis not present

## 2021-11-19 DIAGNOSIS — M25672 Stiffness of left ankle, not elsewhere classified: Secondary | ICD-10-CM | POA: Diagnosis not present

## 2021-11-19 DIAGNOSIS — M25671 Stiffness of right ankle, not elsewhere classified: Secondary | ICD-10-CM | POA: Diagnosis not present

## 2021-11-19 DIAGNOSIS — M6259 Muscle wasting and atrophy, not elsewhere classified, multiple sites: Secondary | ICD-10-CM | POA: Diagnosis not present

## 2021-11-19 DIAGNOSIS — M25471 Effusion, right ankle: Secondary | ICD-10-CM | POA: Diagnosis not present

## 2021-11-19 DIAGNOSIS — M25472 Effusion, left ankle: Secondary | ICD-10-CM | POA: Diagnosis not present

## 2021-11-20 DIAGNOSIS — S22088D Other fracture of T11-T12 vertebra, subsequent encounter for fracture with routine healing: Secondary | ICD-10-CM | POA: Diagnosis not present

## 2021-11-20 DIAGNOSIS — M6281 Muscle weakness (generalized): Secondary | ICD-10-CM | POA: Diagnosis not present

## 2021-11-22 DIAGNOSIS — E785 Hyperlipidemia, unspecified: Secondary | ICD-10-CM | POA: Diagnosis not present

## 2021-11-22 DIAGNOSIS — M25472 Effusion, left ankle: Secondary | ICD-10-CM | POA: Diagnosis not present

## 2021-11-22 DIAGNOSIS — M25671 Stiffness of right ankle, not elsewhere classified: Secondary | ICD-10-CM | POA: Diagnosis not present

## 2021-11-22 DIAGNOSIS — M159 Polyosteoarthritis, unspecified: Secondary | ICD-10-CM | POA: Diagnosis not present

## 2021-11-22 DIAGNOSIS — R269 Unspecified abnormalities of gait and mobility: Secondary | ICD-10-CM | POA: Diagnosis not present

## 2021-11-22 DIAGNOSIS — I10 Essential (primary) hypertension: Secondary | ICD-10-CM | POA: Diagnosis not present

## 2021-11-22 DIAGNOSIS — M25471 Effusion, right ankle: Secondary | ICD-10-CM | POA: Diagnosis not present

## 2021-11-22 DIAGNOSIS — M6259 Muscle wasting and atrophy, not elsewhere classified, multiple sites: Secondary | ICD-10-CM | POA: Diagnosis not present

## 2021-11-22 DIAGNOSIS — E1169 Type 2 diabetes mellitus with other specified complication: Secondary | ICD-10-CM | POA: Diagnosis not present

## 2021-11-22 DIAGNOSIS — M25672 Stiffness of left ankle, not elsewhere classified: Secondary | ICD-10-CM | POA: Diagnosis not present
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
## 2021-11-26 ENCOUNTER — Other Ambulatory Visit: Payer: Self-pay

## 2021-11-26 ENCOUNTER — Ambulatory Visit: Payer: Medicare Other

## 2021-11-26 DIAGNOSIS — R609 Edema, unspecified: Secondary | ICD-10-CM

## 2021-11-26 DIAGNOSIS — M6259 Muscle wasting and atrophy, not elsewhere classified, multiple sites: Secondary | ICD-10-CM | POA: Diagnosis not present

## 2021-11-26 DIAGNOSIS — M25471 Effusion, right ankle: Secondary | ICD-10-CM | POA: Diagnosis not present

## 2021-11-26 DIAGNOSIS — M2142 Flat foot [pes planus] (acquired), left foot: Secondary | ICD-10-CM

## 2021-11-26 DIAGNOSIS — M2141 Flat foot [pes planus] (acquired), right foot: Secondary | ICD-10-CM

## 2021-11-26 DIAGNOSIS — R269 Unspecified abnormalities of gait and mobility: Secondary | ICD-10-CM | POA: Diagnosis not present

## 2021-11-26 DIAGNOSIS — M25672 Stiffness of left ankle, not elsewhere classified: Secondary | ICD-10-CM | POA: Diagnosis not present

## 2021-11-26 DIAGNOSIS — E1140 Type 2 diabetes mellitus with diabetic neuropathy, unspecified: Secondary | ICD-10-CM

## 2021-11-26 DIAGNOSIS — E1149 Type 2 diabetes mellitus with other diabetic neurological complication: Secondary | ICD-10-CM

## 2021-11-26 DIAGNOSIS — M25671 Stiffness of right ankle, not elsewhere classified: Secondary | ICD-10-CM | POA: Diagnosis not present

## 2021-11-26 DIAGNOSIS — M25472 Effusion, left ankle: Secondary | ICD-10-CM | POA: Diagnosis not present

## 2021-11-26 NOTE — Progress Notes (Signed)
SITUATION Reason for Visit: Fitting of Diabetic Shoes & Insoles Patient / Caregiver Report:  Patient reports comfort and is satisfied  OBJECTIVE DATA: Patient History / Diagnosis:     ICD-10-CM   1. Type II diabetes mellitus with neurological manifestations ([place])  E11.49     2. Swelling  R60.9     3. Pes planus of both feet  M21.41    M21.42       Change in Status:   None  ACTIONS PERFORMED: In-Person Delivery, patient was fit with: - 1x pair A5500 PDAC approved prefabricated Diabetic Shoes: T6000 stretch shoes - 3x pair B1660 PDAC approved CAM milled custom diabetic insoles  Shoes and insoles were verified for structural integrity and safety. Patient wore shoes and insoles in office. Skin was inspected and free of areas of concern after wearing shoes and inserts. Shoes and inserts fit properly. Patient / Caregiver provided with ferbal instruction and demonstration regarding donning, doffing, wear, care, proper fit, function, purpose, cleaning, and use of shoes and insoles ' and in all related precautions and risks and benefits regarding shoes and insoles. Patient / Caregiver was instructed to wear properly fitting socks with shoes at all times. Patient was also provided with verbal instruction regarding how to report any failures or malfunctions of shoes or inserts, and necessary follow up care. Patient / Caregiver was also instructed to contact physician regarding change in status that may affect function of shoes and inserts.   Patient / Caregiver verbalized undersatnding of instruction provided. Patient / Caregiver demonstrated independence with proper donning and doffing of shoes and inserts.  PLAN Patient to follow up as needed. Plan of care was discussed with and agreed upon by patient and/or caregiver. All questions were answered and concerns addressed.

## 2021-11-29 DIAGNOSIS — M25672 Stiffness of left ankle, not elsewhere classified: Secondary | ICD-10-CM | POA: Diagnosis not present

## 2021-11-29 DIAGNOSIS — R69 Illness, unspecified: Secondary | ICD-10-CM | POA: Diagnosis not present

## 2021-11-29 DIAGNOSIS — R269 Unspecified abnormalities of gait and mobility: Secondary | ICD-10-CM | POA: Diagnosis not present

## 2021-11-29 DIAGNOSIS — M25472 Effusion, left ankle: Secondary | ICD-10-CM | POA: Diagnosis not present

## 2021-11-29 DIAGNOSIS — M25471 Effusion, right ankle: Secondary | ICD-10-CM | POA: Diagnosis not present

## 2021-11-29 DIAGNOSIS — M6259 Muscle wasting and atrophy, not elsewhere classified, multiple sites: Secondary | ICD-10-CM | POA: Diagnosis not present

## 2021-11-29 DIAGNOSIS — M25671 Stiffness of right ankle, not elsewhere classified: Secondary | ICD-10-CM | POA: Diagnosis not present

## 2021-12-02 DIAGNOSIS — M25671 Stiffness of right ankle, not elsewhere classified: Secondary | ICD-10-CM | POA: Diagnosis not present

## 2021-12-02 DIAGNOSIS — M25472 Effusion, left ankle: Secondary | ICD-10-CM | POA: Diagnosis not present

## 2021-12-02 DIAGNOSIS — M25672 Stiffness of left ankle, not elsewhere classified: Secondary | ICD-10-CM | POA: Diagnosis not present

## 2021-12-02 DIAGNOSIS — R69 Illness, unspecified: Secondary | ICD-10-CM | POA: Diagnosis not present

## 2021-12-02 DIAGNOSIS — M6259 Muscle wasting and atrophy, not elsewhere classified, multiple sites: Secondary | ICD-10-CM | POA: Diagnosis not present

## 2021-12-02 DIAGNOSIS — M25471 Effusion, right ankle: Secondary | ICD-10-CM | POA: Diagnosis not present

## 2021-12-02 DIAGNOSIS — R269 Unspecified abnormalities of gait and mobility: Secondary | ICD-10-CM | POA: Diagnosis not present

## 2021-12-06 DIAGNOSIS — M25471 Effusion, right ankle: Secondary | ICD-10-CM | POA: Diagnosis not present

## 2021-12-06 DIAGNOSIS — R69 Illness, unspecified: Secondary | ICD-10-CM | POA: Diagnosis not present

## 2021-12-06 DIAGNOSIS — M25671 Stiffness of right ankle, not elsewhere classified: Secondary | ICD-10-CM | POA: Diagnosis not present

## 2021-12-06 DIAGNOSIS — M25672 Stiffness of left ankle, not elsewhere classified: Secondary | ICD-10-CM | POA: Diagnosis not present

## 2021-12-06 DIAGNOSIS — R269 Unspecified abnormalities of gait and mobility: Secondary | ICD-10-CM | POA: Diagnosis not present

## 2021-12-06 DIAGNOSIS — M6259 Muscle wasting and atrophy, not elsewhere classified, multiple sites: Secondary | ICD-10-CM | POA: Diagnosis not present

## 2021-12-06 DIAGNOSIS — M25472 Effusion, left ankle: Secondary | ICD-10-CM | POA: Diagnosis not present

## 2021-12-09 DIAGNOSIS — M6259 Muscle wasting and atrophy, not elsewhere classified, multiple sites: Secondary | ICD-10-CM | POA: Diagnosis not present

## 2021-12-09 DIAGNOSIS — M25672 Stiffness of left ankle, not elsewhere classified: Secondary | ICD-10-CM | POA: Diagnosis not present

## 2021-12-09 DIAGNOSIS — M25471 Effusion, right ankle: Secondary | ICD-10-CM | POA: Diagnosis not present

## 2021-12-09 DIAGNOSIS — M25472 Effusion, left ankle: Secondary | ICD-10-CM | POA: Diagnosis not present

## 2021-12-09 DIAGNOSIS — R69 Illness, unspecified: Secondary | ICD-10-CM | POA: Diagnosis not present

## 2021-12-09 DIAGNOSIS — R269 Unspecified abnormalities of gait and mobility: Secondary | ICD-10-CM | POA: Diagnosis not present

## 2021-12-09 DIAGNOSIS — M25671 Stiffness of right ankle, not elsewhere classified: Secondary | ICD-10-CM | POA: Diagnosis not present

## 2021-12-10 ENCOUNTER — Ambulatory Visit: Payer: Medicare Other | Admitting: Podiatry

## 2021-12-13 DIAGNOSIS — R69 Illness, unspecified: Secondary | ICD-10-CM | POA: Diagnosis not present

## 2021-12-13 DIAGNOSIS — M6259 Muscle wasting and atrophy, not elsewhere classified, multiple sites: Secondary | ICD-10-CM | POA: Diagnosis not present

## 2021-12-13 DIAGNOSIS — R269 Unspecified abnormalities of gait and mobility: Secondary | ICD-10-CM | POA: Diagnosis not present

## 2021-12-13 DIAGNOSIS — M25672 Stiffness of left ankle, not elsewhere classified: Secondary | ICD-10-CM | POA: Diagnosis not present

## 2021-12-13 DIAGNOSIS — M25671 Stiffness of right ankle, not elsewhere classified: Secondary | ICD-10-CM | POA: Diagnosis not present

## 2021-12-13 DIAGNOSIS — M25471 Effusion, right ankle: Secondary | ICD-10-CM | POA: Diagnosis not present

## 2021-12-13 DIAGNOSIS — M25472 Effusion, left ankle: Secondary | ICD-10-CM | POA: Diagnosis not present

## 2021-12-16 ENCOUNTER — Ambulatory Visit (INDEPENDENT_AMBULATORY_CARE_PROVIDER_SITE_OTHER): Payer: Medicare Other

## 2021-12-16 ENCOUNTER — Other Ambulatory Visit: Payer: Self-pay

## 2021-12-16 ENCOUNTER — Ambulatory Visit (INDEPENDENT_AMBULATORY_CARE_PROVIDER_SITE_OTHER): Payer: Medicare Other | Admitting: Podiatry

## 2021-12-16 DIAGNOSIS — M722 Plantar fascial fibromatosis: Secondary | ICD-10-CM

## 2021-12-16 DIAGNOSIS — M778 Other enthesopathies, not elsewhere classified: Secondary | ICD-10-CM | POA: Diagnosis not present

## 2021-12-16 DIAGNOSIS — B351 Tinea unguium: Secondary | ICD-10-CM | POA: Diagnosis not present

## 2021-12-16 DIAGNOSIS — R269 Unspecified abnormalities of gait and mobility: Secondary | ICD-10-CM | POA: Diagnosis not present

## 2021-12-16 DIAGNOSIS — M25471 Effusion, right ankle: Secondary | ICD-10-CM | POA: Diagnosis not present

## 2021-12-16 DIAGNOSIS — E1149 Type 2 diabetes mellitus with other diabetic neurological complication: Secondary | ICD-10-CM | POA: Diagnosis not present

## 2021-12-16 DIAGNOSIS — M25672 Stiffness of left ankle, not elsewhere classified: Secondary | ICD-10-CM | POA: Diagnosis not present

## 2021-12-16 DIAGNOSIS — Z7901 Long term (current) use of anticoagulants: Secondary | ICD-10-CM

## 2021-12-16 DIAGNOSIS — M79674 Pain in right toe(s): Secondary | ICD-10-CM

## 2021-12-16 DIAGNOSIS — M6259 Muscle wasting and atrophy, not elsewhere classified, multiple sites: Secondary | ICD-10-CM | POA: Diagnosis not present

## 2021-12-16 DIAGNOSIS — M79675 Pain in left toe(s): Secondary | ICD-10-CM

## 2021-12-16 DIAGNOSIS — M25472 Effusion, left ankle: Secondary | ICD-10-CM | POA: Diagnosis not present

## 2021-12-16 DIAGNOSIS — R69 Illness, unspecified: Secondary | ICD-10-CM | POA: Diagnosis not present

## 2021-12-16 DIAGNOSIS — M25671 Stiffness of right ankle, not elsewhere classified: Secondary | ICD-10-CM | POA: Diagnosis not present

## 2021-12-20 DIAGNOSIS — M25672 Stiffness of left ankle, not elsewhere classified: Secondary | ICD-10-CM | POA: Diagnosis not present

## 2021-12-20 DIAGNOSIS — M25472 Effusion, left ankle: Secondary | ICD-10-CM | POA: Diagnosis not present

## 2021-12-20 DIAGNOSIS — R69 Illness, unspecified: Secondary | ICD-10-CM | POA: Diagnosis not present

## 2021-12-20 DIAGNOSIS — M6259 Muscle wasting and atrophy, not elsewhere classified, multiple sites: Secondary | ICD-10-CM | POA: Diagnosis not present

## 2021-12-20 DIAGNOSIS — M25671 Stiffness of right ankle, not elsewhere classified: Secondary | ICD-10-CM | POA: Diagnosis not present

## 2021-12-20 DIAGNOSIS — M25471 Effusion, right ankle: Secondary | ICD-10-CM | POA: Diagnosis not present

## 2021-12-20 DIAGNOSIS — R269 Unspecified abnormalities of gait and mobility: Secondary | ICD-10-CM | POA: Diagnosis not present

## 2021-12-20 NOTE — Progress Notes (Signed)
Subjective: 71 y.o. returns the office today for painful, elongated, thickened toenails which she cannot trim herself. Denies any redness or drainage around the nails.  She states that she still has some neuropathy she is doing physical therapy twice a week which has been very beneficial for her to work on her gait as well.  She is complaining of foot pain on both sides no recent injury that she reports.  She has had some chronic swelling but this is not new.  Denies any open sores.  PCP: Lucianne Lei, MD  Objective: AAO 3, NAD DP/PT pulses palpable, CRT less than 3 seconds Chronic bilateral lower extremity edema present. Nails hypertrophic, dystrophic, elongated, brittle, discolored 10. There is tenderness overlying the nails 1-5 bilaterally. There is no surrounding erythema or drainage along the nail sites.  Chronic incurvation present of the toenails without signs of infection. Symptoms when she is getting discomfort on the forefoot bilaterally.  Not able to elicit any area of pinpoint tenderness today.  There is chronic appearing edema there is no erythema or warmth associated with this. No areas of tenderness identified otherwise. No open lesions or pre-ulcerative lesions are identified. No pain with calf compression, swelling, warmth, erythema.  Assessment: Patient presents with symptomatic onychomycosis; chronic edema; bilateral foot pain, neuropathy  Plan: -Treatment options including alternatives, risks, complications were discussed -X-rays obtained and reviewed.  Arthritic changes present the first MPJ but no evidence of acute fracture or stress fracture noted. -Regards to the foot pain continue with physical therapy as this has been beneficial for her.  As I think her symptoms are from neuropathy.  Discussed different medication options for this and she is currently on gabapentin. -Nails sharply debrided 10 without complication/bleeding. -Continue compression, elevation for the  edema. -Discussed daily foot inspection. If there are any changes, to call the office immediately.  -Follow-up in 3 months or sooner if any problems are to arise. In the meantime, encouraged to call the office with any questions, concerns, changes symptoms.  Celesta Gentile, DPM

## 2021-12-21 DIAGNOSIS — M6281 Muscle weakness (generalized): Secondary | ICD-10-CM | POA: Diagnosis not present

## 2021-12-21 DIAGNOSIS — S22088D Other fracture of T11-T12 vertebra, subsequent encounter for fracture with routine healing: Secondary | ICD-10-CM | POA: Diagnosis not present

## 2021-12-23 DIAGNOSIS — M25672 Stiffness of left ankle, not elsewhere classified: Secondary | ICD-10-CM | POA: Diagnosis not present

## 2021-12-23 DIAGNOSIS — R269 Unspecified abnormalities of gait and mobility: Secondary | ICD-10-CM | POA: Diagnosis not present

## 2021-12-23 DIAGNOSIS — M25671 Stiffness of right ankle, not elsewhere classified: Secondary | ICD-10-CM | POA: Diagnosis not present

## 2021-12-23 DIAGNOSIS — M25471 Effusion, right ankle: Secondary | ICD-10-CM | POA: Diagnosis not present

## 2021-12-23 DIAGNOSIS — M6259 Muscle wasting and atrophy, not elsewhere classified, multiple sites: Secondary | ICD-10-CM | POA: Diagnosis not present

## 2021-12-23 DIAGNOSIS — R69 Illness, unspecified: Secondary | ICD-10-CM | POA: Diagnosis not present

## 2021-12-23 DIAGNOSIS — M25472 Effusion, left ankle: Secondary | ICD-10-CM | POA: Diagnosis not present

## 2021-12-27 DIAGNOSIS — M25671 Stiffness of right ankle, not elsewhere classified: Secondary | ICD-10-CM | POA: Diagnosis not present

## 2021-12-27 DIAGNOSIS — M6259 Muscle wasting and atrophy, not elsewhere classified, multiple sites: Secondary | ICD-10-CM | POA: Diagnosis not present

## 2021-12-27 DIAGNOSIS — M25672 Stiffness of left ankle, not elsewhere classified: Secondary | ICD-10-CM | POA: Diagnosis not present

## 2021-12-27 DIAGNOSIS — M25472 Effusion, left ankle: Secondary | ICD-10-CM | POA: Diagnosis not present

## 2021-12-27 DIAGNOSIS — M25471 Effusion, right ankle: Secondary | ICD-10-CM | POA: Diagnosis not present

## 2021-12-27 DIAGNOSIS — R269 Unspecified abnormalities of gait and mobility: Secondary | ICD-10-CM | POA: Diagnosis not present

## 2021-12-30 ENCOUNTER — Other Ambulatory Visit: Payer: Self-pay

## 2021-12-30 ENCOUNTER — Ambulatory Visit: Payer: Medicare Other

## 2021-12-30 DIAGNOSIS — M6259 Muscle wasting and atrophy, not elsewhere classified, multiple sites: Secondary | ICD-10-CM | POA: Diagnosis not present

## 2021-12-30 DIAGNOSIS — M25671 Stiffness of right ankle, not elsewhere classified: Secondary | ICD-10-CM | POA: Diagnosis not present

## 2021-12-30 DIAGNOSIS — M2141 Flat foot [pes planus] (acquired), right foot: Secondary | ICD-10-CM

## 2021-12-30 DIAGNOSIS — R269 Unspecified abnormalities of gait and mobility: Secondary | ICD-10-CM | POA: Diagnosis not present

## 2021-12-30 DIAGNOSIS — M25472 Effusion, left ankle: Secondary | ICD-10-CM | POA: Diagnosis not present

## 2021-12-30 DIAGNOSIS — M25672 Stiffness of left ankle, not elsewhere classified: Secondary | ICD-10-CM | POA: Diagnosis not present

## 2021-12-30 DIAGNOSIS — E1149 Type 2 diabetes mellitus with other diabetic neurological complication: Secondary | ICD-10-CM

## 2021-12-30 DIAGNOSIS — M25471 Effusion, right ankle: Secondary | ICD-10-CM | POA: Diagnosis not present

## 2021-12-30 NOTE — Progress Notes (Signed)
SITUATION Reason for Consult: Follow-up with diabetic shoes and insoles Patient / Caregiver Report: Patient has difficulty self-donning shoes.  OBJECTIVE DATA History / Diagnosis:    ICD-10-CM   1. Type II diabetes mellitus with neurological manifestations ([place])  E11.49     2. Pes planus of both feet  M21.41    M21.42       Change in Pathology: None  ACTIONS PERFORMED Patient's equipment was checked for structural stability and fit. Informed patient we are happy to exchange her T6000 shoes for A3200W but patient will need to bring the box and all the original contents. Patient understands and will bring back box, shoes, and original insoles for exchange. Patient's existing foot orthotics fit well and patient is satisfied with them. All questions answered and concerns addressed.  PLAN Patient to bring shoes in box with original insoles to exchange for a different model. Plan of care discussed with and agreed upon by patient / caregiver.

## 2022-01-03 DIAGNOSIS — R269 Unspecified abnormalities of gait and mobility: Secondary | ICD-10-CM | POA: Diagnosis not present

## 2022-01-03 DIAGNOSIS — M25671 Stiffness of right ankle, not elsewhere classified: Secondary | ICD-10-CM | POA: Diagnosis not present

## 2022-01-03 DIAGNOSIS — M25472 Effusion, left ankle: Secondary | ICD-10-CM | POA: Diagnosis not present

## 2022-01-03 DIAGNOSIS — M6259 Muscle wasting and atrophy, not elsewhere classified, multiple sites: Secondary | ICD-10-CM | POA: Diagnosis not present

## 2022-01-03 DIAGNOSIS — M25672 Stiffness of left ankle, not elsewhere classified: Secondary | ICD-10-CM | POA: Diagnosis not present

## 2022-01-03 DIAGNOSIS — M25471 Effusion, right ankle: Secondary | ICD-10-CM | POA: Diagnosis not present

## 2022-01-06 DIAGNOSIS — M25671 Stiffness of right ankle, not elsewhere classified: Secondary | ICD-10-CM | POA: Diagnosis not present

## 2022-01-06 DIAGNOSIS — M25672 Stiffness of left ankle, not elsewhere classified: Secondary | ICD-10-CM | POA: Diagnosis not present

## 2022-01-06 DIAGNOSIS — M25471 Effusion, right ankle: Secondary | ICD-10-CM | POA: Diagnosis not present

## 2022-01-06 DIAGNOSIS — M6259 Muscle wasting and atrophy, not elsewhere classified, multiple sites: Secondary | ICD-10-CM | POA: Diagnosis not present

## 2022-01-06 DIAGNOSIS — M25472 Effusion, left ankle: Secondary | ICD-10-CM | POA: Diagnosis not present

## 2022-01-06 DIAGNOSIS — R269 Unspecified abnormalities of gait and mobility: Secondary | ICD-10-CM | POA: Diagnosis not present

## 2022-01-08 ENCOUNTER — Telehealth: Payer: Self-pay

## 2022-01-08 NOTE — Telephone Encounter (Signed)
ERROR - no phonecall

## 2022-01-10 DIAGNOSIS — M25472 Effusion, left ankle: Secondary | ICD-10-CM | POA: Diagnosis not present

## 2022-01-10 DIAGNOSIS — R269 Unspecified abnormalities of gait and mobility: Secondary | ICD-10-CM | POA: Diagnosis not present

## 2022-01-10 DIAGNOSIS — M6259 Muscle wasting and atrophy, not elsewhere classified, multiple sites: Secondary | ICD-10-CM | POA: Diagnosis not present

## 2022-01-10 DIAGNOSIS — M25471 Effusion, right ankle: Secondary | ICD-10-CM | POA: Diagnosis not present

## 2022-01-10 DIAGNOSIS — M25672 Stiffness of left ankle, not elsewhere classified: Secondary | ICD-10-CM | POA: Diagnosis not present

## 2022-01-10 DIAGNOSIS — M25671 Stiffness of right ankle, not elsewhere classified: Secondary | ICD-10-CM | POA: Diagnosis not present

## 2022-01-13 DIAGNOSIS — M25671 Stiffness of right ankle, not elsewhere classified: Secondary | ICD-10-CM | POA: Diagnosis not present

## 2022-01-13 DIAGNOSIS — M25672 Stiffness of left ankle, not elsewhere classified: Secondary | ICD-10-CM | POA: Diagnosis not present

## 2022-01-13 DIAGNOSIS — M25472 Effusion, left ankle: Secondary | ICD-10-CM | POA: Diagnosis not present

## 2022-01-13 DIAGNOSIS — R269 Unspecified abnormalities of gait and mobility: Secondary | ICD-10-CM | POA: Diagnosis not present

## 2022-01-13 DIAGNOSIS — M25471 Effusion, right ankle: Secondary | ICD-10-CM | POA: Diagnosis not present

## 2022-01-13 DIAGNOSIS — M6259 Muscle wasting and atrophy, not elsewhere classified, multiple sites: Secondary | ICD-10-CM | POA: Diagnosis not present

## 2022-01-17 DIAGNOSIS — R269 Unspecified abnormalities of gait and mobility: Secondary | ICD-10-CM | POA: Diagnosis not present

## 2022-01-17 DIAGNOSIS — M25472 Effusion, left ankle: Secondary | ICD-10-CM | POA: Diagnosis not present

## 2022-01-17 DIAGNOSIS — M25672 Stiffness of left ankle, not elsewhere classified: Secondary | ICD-10-CM | POA: Diagnosis not present

## 2022-01-17 DIAGNOSIS — M25471 Effusion, right ankle: Secondary | ICD-10-CM | POA: Diagnosis not present

## 2022-01-17 DIAGNOSIS — M6259 Muscle wasting and atrophy, not elsewhere classified, multiple sites: Secondary | ICD-10-CM | POA: Diagnosis not present

## 2022-01-17 DIAGNOSIS — M25671 Stiffness of right ankle, not elsewhere classified: Secondary | ICD-10-CM | POA: Diagnosis not present

## 2022-01-20 DIAGNOSIS — M25472 Effusion, left ankle: Secondary | ICD-10-CM | POA: Diagnosis not present

## 2022-01-20 DIAGNOSIS — R269 Unspecified abnormalities of gait and mobility: Secondary | ICD-10-CM | POA: Diagnosis not present

## 2022-01-20 DIAGNOSIS — M25471 Effusion, right ankle: Secondary | ICD-10-CM | POA: Diagnosis not present

## 2022-01-20 DIAGNOSIS — M25671 Stiffness of right ankle, not elsewhere classified: Secondary | ICD-10-CM | POA: Diagnosis not present

## 2022-01-20 DIAGNOSIS — M25672 Stiffness of left ankle, not elsewhere classified: Secondary | ICD-10-CM | POA: Diagnosis not present

## 2022-01-20 DIAGNOSIS — M6259 Muscle wasting and atrophy, not elsewhere classified, multiple sites: Secondary | ICD-10-CM | POA: Diagnosis not present

## 2022-01-21 DIAGNOSIS — S22088D Other fracture of T11-T12 vertebra, subsequent encounter for fracture with routine healing: Secondary | ICD-10-CM | POA: Diagnosis not present

## 2022-01-21 DIAGNOSIS — M6281 Muscle weakness (generalized): Secondary | ICD-10-CM | POA: Diagnosis not present

## 2022-01-24 DIAGNOSIS — I10 Essential (primary) hypertension: Secondary | ICD-10-CM | POA: Diagnosis not present

## 2022-01-24 DIAGNOSIS — E785 Hyperlipidemia, unspecified: Secondary | ICD-10-CM | POA: Diagnosis not present

## 2022-01-27 ENCOUNTER — Ambulatory Visit: Payer: Medicare Other

## 2022-01-27 DIAGNOSIS — M25471 Effusion, right ankle: Secondary | ICD-10-CM | POA: Diagnosis not present

## 2022-01-27 DIAGNOSIS — E1149 Type 2 diabetes mellitus with other diabetic neurological complication: Secondary | ICD-10-CM

## 2022-01-27 DIAGNOSIS — M25671 Stiffness of right ankle, not elsewhere classified: Secondary | ICD-10-CM | POA: Diagnosis not present

## 2022-01-27 DIAGNOSIS — M25672 Stiffness of left ankle, not elsewhere classified: Secondary | ICD-10-CM | POA: Diagnosis not present

## 2022-01-27 DIAGNOSIS — R269 Unspecified abnormalities of gait and mobility: Secondary | ICD-10-CM | POA: Diagnosis not present

## 2022-01-27 DIAGNOSIS — M2141 Flat foot [pes planus] (acquired), right foot: Secondary | ICD-10-CM

## 2022-01-27 DIAGNOSIS — M6259 Muscle wasting and atrophy, not elsewhere classified, multiple sites: Secondary | ICD-10-CM | POA: Diagnosis not present

## 2022-01-27 DIAGNOSIS — M25472 Effusion, left ankle: Secondary | ICD-10-CM | POA: Diagnosis not present

## 2022-01-27 NOTE — Progress Notes (Signed)
SITUATION ?Reason for Consult: Follow-up with diabetic shoes and insoles ?Patient / Caregiver Report: Patient is satisfied with new shoes ? ?OBJECTIVE DATA ?History / Diagnosis:  ?  ICD-10-CM   ?1. Type II diabetes mellitus with neurological manifestations ([place])  E11.49   ?  ?2. Pes planus of both feet  M21.41   ? M21.42   ?  ? ? ?Change in Pathology: None ? ?ACTIONS PERFORMED ?Patient's equipment was checked for structural stability and fit. Fit patient with A3200W shoes in exchange for discontinued model. Patient is satisfied with fit and function. Device(s) intact and fit is excellent. All questions answered and concerns addressed. ? ?PLAN ?Follow-up as needed (PRN). Plan of care discussed with and agreed upon by patient / caregiver. ? ?

## 2022-01-31 DIAGNOSIS — M25472 Effusion, left ankle: Secondary | ICD-10-CM | POA: Diagnosis not present

## 2022-01-31 DIAGNOSIS — M6259 Muscle wasting and atrophy, not elsewhere classified, multiple sites: Secondary | ICD-10-CM | POA: Diagnosis not present

## 2022-01-31 DIAGNOSIS — M25672 Stiffness of left ankle, not elsewhere classified: Secondary | ICD-10-CM | POA: Diagnosis not present

## 2022-01-31 DIAGNOSIS — M25471 Effusion, right ankle: Secondary | ICD-10-CM | POA: Diagnosis not present

## 2022-01-31 DIAGNOSIS — M25671 Stiffness of right ankle, not elsewhere classified: Secondary | ICD-10-CM | POA: Diagnosis not present

## 2022-01-31 DIAGNOSIS — R269 Unspecified abnormalities of gait and mobility: Secondary | ICD-10-CM | POA: Diagnosis not present

## 2022-02-03 DIAGNOSIS — M25471 Effusion, right ankle: Secondary | ICD-10-CM | POA: Diagnosis not present

## 2022-02-03 DIAGNOSIS — R269 Unspecified abnormalities of gait and mobility: Secondary | ICD-10-CM | POA: Diagnosis not present

## 2022-02-03 DIAGNOSIS — M25671 Stiffness of right ankle, not elsewhere classified: Secondary | ICD-10-CM | POA: Diagnosis not present

## 2022-02-03 DIAGNOSIS — M25472 Effusion, left ankle: Secondary | ICD-10-CM | POA: Diagnosis not present

## 2022-02-03 DIAGNOSIS — M6259 Muscle wasting and atrophy, not elsewhere classified, multiple sites: Secondary | ICD-10-CM | POA: Diagnosis not present

## 2022-02-03 DIAGNOSIS — M25672 Stiffness of left ankle, not elsewhere classified: Secondary | ICD-10-CM | POA: Diagnosis not present

## 2022-02-07 DIAGNOSIS — M6259 Muscle wasting and atrophy, not elsewhere classified, multiple sites: Secondary | ICD-10-CM | POA: Diagnosis not present

## 2022-02-07 DIAGNOSIS — M25672 Stiffness of left ankle, not elsewhere classified: Secondary | ICD-10-CM | POA: Diagnosis not present

## 2022-02-07 DIAGNOSIS — M25471 Effusion, right ankle: Secondary | ICD-10-CM | POA: Diagnosis not present

## 2022-02-07 DIAGNOSIS — M25671 Stiffness of right ankle, not elsewhere classified: Secondary | ICD-10-CM | POA: Diagnosis not present

## 2022-02-07 DIAGNOSIS — R269 Unspecified abnormalities of gait and mobility: Secondary | ICD-10-CM | POA: Diagnosis not present

## 2022-02-07 DIAGNOSIS — M25472 Effusion, left ankle: Secondary | ICD-10-CM | POA: Diagnosis not present

## 2022-02-10 DIAGNOSIS — M25671 Stiffness of right ankle, not elsewhere classified: Secondary | ICD-10-CM | POA: Diagnosis not present

## 2022-02-10 DIAGNOSIS — M25672 Stiffness of left ankle, not elsewhere classified: Secondary | ICD-10-CM | POA: Diagnosis not present

## 2022-02-10 DIAGNOSIS — M6259 Muscle wasting and atrophy, not elsewhere classified, multiple sites: Secondary | ICD-10-CM | POA: Diagnosis not present

## 2022-02-10 DIAGNOSIS — M25472 Effusion, left ankle: Secondary | ICD-10-CM | POA: Diagnosis not present

## 2022-02-10 DIAGNOSIS — R269 Unspecified abnormalities of gait and mobility: Secondary | ICD-10-CM | POA: Diagnosis not present

## 2022-02-10 DIAGNOSIS — M25471 Effusion, right ankle: Secondary | ICD-10-CM | POA: Diagnosis not present

## 2022-02-12 ENCOUNTER — Encounter: Payer: Self-pay | Admitting: Cardiology

## 2022-02-14 DIAGNOSIS — R269 Unspecified abnormalities of gait and mobility: Secondary | ICD-10-CM | POA: Diagnosis not present

## 2022-02-14 DIAGNOSIS — M25472 Effusion, left ankle: Secondary | ICD-10-CM | POA: Diagnosis not present

## 2022-02-14 DIAGNOSIS — M25672 Stiffness of left ankle, not elsewhere classified: Secondary | ICD-10-CM | POA: Diagnosis not present

## 2022-02-14 DIAGNOSIS — M25471 Effusion, right ankle: Secondary | ICD-10-CM | POA: Diagnosis not present

## 2022-02-14 DIAGNOSIS — M25671 Stiffness of right ankle, not elsewhere classified: Secondary | ICD-10-CM | POA: Diagnosis not present

## 2022-02-14 DIAGNOSIS — M6259 Muscle wasting and atrophy, not elsewhere classified, multiple sites: Secondary | ICD-10-CM | POA: Diagnosis not present

## 2022-02-17 ENCOUNTER — Ambulatory Visit: Payer: Medicare Other | Admitting: Podiatry

## 2022-02-17 DIAGNOSIS — R1030 Lower abdominal pain, unspecified: Secondary | ICD-10-CM | POA: Diagnosis not present

## 2022-02-21 DIAGNOSIS — M25671 Stiffness of right ankle, not elsewhere classified: Secondary | ICD-10-CM | POA: Diagnosis not present

## 2022-02-21 DIAGNOSIS — M6259 Muscle wasting and atrophy, not elsewhere classified, multiple sites: Secondary | ICD-10-CM | POA: Diagnosis not present

## 2022-02-21 DIAGNOSIS — R269 Unspecified abnormalities of gait and mobility: Secondary | ICD-10-CM | POA: Diagnosis not present

## 2022-02-21 DIAGNOSIS — M25472 Effusion, left ankle: Secondary | ICD-10-CM | POA: Diagnosis not present

## 2022-02-21 DIAGNOSIS — M25471 Effusion, right ankle: Secondary | ICD-10-CM | POA: Diagnosis not present

## 2022-02-21 DIAGNOSIS — M25672 Stiffness of left ankle, not elsewhere classified: Secondary | ICD-10-CM | POA: Diagnosis not present

## 2022-02-28 DIAGNOSIS — M25471 Effusion, right ankle: Secondary | ICD-10-CM | POA: Diagnosis not present

## 2022-02-28 DIAGNOSIS — M6259 Muscle wasting and atrophy, not elsewhere classified, multiple sites: Secondary | ICD-10-CM | POA: Diagnosis not present

## 2022-02-28 DIAGNOSIS — R269 Unspecified abnormalities of gait and mobility: Secondary | ICD-10-CM | POA: Diagnosis not present

## 2022-02-28 DIAGNOSIS — M25671 Stiffness of right ankle, not elsewhere classified: Secondary | ICD-10-CM | POA: Diagnosis not present

## 2022-02-28 DIAGNOSIS — M25672 Stiffness of left ankle, not elsewhere classified: Secondary | ICD-10-CM | POA: Diagnosis not present

## 2022-02-28 DIAGNOSIS — M25472 Effusion, left ankle: Secondary | ICD-10-CM | POA: Diagnosis not present

## 2022-03-03 DIAGNOSIS — M25671 Stiffness of right ankle, not elsewhere classified: Secondary | ICD-10-CM | POA: Diagnosis not present

## 2022-03-03 DIAGNOSIS — R269 Unspecified abnormalities of gait and mobility: Secondary | ICD-10-CM | POA: Diagnosis not present

## 2022-03-03 DIAGNOSIS — M25471 Effusion, right ankle: Secondary | ICD-10-CM | POA: Diagnosis not present

## 2022-03-03 DIAGNOSIS — M25472 Effusion, left ankle: Secondary | ICD-10-CM | POA: Diagnosis not present

## 2022-03-03 DIAGNOSIS — M6259 Muscle wasting and atrophy, not elsewhere classified, multiple sites: Secondary | ICD-10-CM | POA: Diagnosis not present

## 2022-03-03 DIAGNOSIS — M25672 Stiffness of left ankle, not elsewhere classified: Secondary | ICD-10-CM | POA: Diagnosis not present

## 2022-03-07 DIAGNOSIS — M25471 Effusion, right ankle: Secondary | ICD-10-CM | POA: Diagnosis not present

## 2022-03-07 DIAGNOSIS — M6259 Muscle wasting and atrophy, not elsewhere classified, multiple sites: Secondary | ICD-10-CM | POA: Diagnosis not present

## 2022-03-07 DIAGNOSIS — R269 Unspecified abnormalities of gait and mobility: Secondary | ICD-10-CM | POA: Diagnosis not present

## 2022-03-07 DIAGNOSIS — M25671 Stiffness of right ankle, not elsewhere classified: Secondary | ICD-10-CM | POA: Diagnosis not present

## 2022-03-07 DIAGNOSIS — M25472 Effusion, left ankle: Secondary | ICD-10-CM | POA: Diagnosis not present

## 2022-03-07 DIAGNOSIS — M25672 Stiffness of left ankle, not elsewhere classified: Secondary | ICD-10-CM | POA: Diagnosis not present

## 2022-03-10 DIAGNOSIS — M6259 Muscle wasting and atrophy, not elsewhere classified, multiple sites: Secondary | ICD-10-CM | POA: Diagnosis not present

## 2022-03-10 DIAGNOSIS — M25472 Effusion, left ankle: Secondary | ICD-10-CM | POA: Diagnosis not present

## 2022-03-10 DIAGNOSIS — R269 Unspecified abnormalities of gait and mobility: Secondary | ICD-10-CM | POA: Diagnosis not present

## 2022-03-10 DIAGNOSIS — M25671 Stiffness of right ankle, not elsewhere classified: Secondary | ICD-10-CM | POA: Diagnosis not present

## 2022-03-10 DIAGNOSIS — M25672 Stiffness of left ankle, not elsewhere classified: Secondary | ICD-10-CM | POA: Diagnosis not present

## 2022-03-10 DIAGNOSIS — M25471 Effusion, right ankle: Secondary | ICD-10-CM | POA: Diagnosis not present

## 2022-03-13 DIAGNOSIS — C8330 Diffuse large B-cell lymphoma, unspecified site: Secondary | ICD-10-CM | POA: Diagnosis not present

## 2022-03-13 DIAGNOSIS — D696 Thrombocytopenia, unspecified: Secondary | ICD-10-CM | POA: Diagnosis not present

## 2022-03-13 DIAGNOSIS — R110 Nausea: Secondary | ICD-10-CM | POA: Diagnosis not present

## 2022-03-13 DIAGNOSIS — R600 Localized edema: Secondary | ICD-10-CM | POA: Diagnosis not present

## 2022-03-13 DIAGNOSIS — K118 Other diseases of salivary glands: Secondary | ICD-10-CM | POA: Diagnosis not present

## 2022-03-13 DIAGNOSIS — R1110 Vomiting, unspecified: Secondary | ICD-10-CM | POA: Diagnosis not present

## 2022-03-13 DIAGNOSIS — E538 Deficiency of other specified B group vitamins: Secondary | ICD-10-CM | POA: Diagnosis not present

## 2022-03-13 DIAGNOSIS — E611 Iron deficiency: Secondary | ICD-10-CM | POA: Diagnosis not present

## 2022-03-14 DIAGNOSIS — M6259 Muscle wasting and atrophy, not elsewhere classified, multiple sites: Secondary | ICD-10-CM | POA: Diagnosis not present

## 2022-03-14 DIAGNOSIS — R269 Unspecified abnormalities of gait and mobility: Secondary | ICD-10-CM | POA: Diagnosis not present

## 2022-03-14 DIAGNOSIS — M25472 Effusion, left ankle: Secondary | ICD-10-CM | POA: Diagnosis not present

## 2022-03-14 DIAGNOSIS — M25671 Stiffness of right ankle, not elsewhere classified: Secondary | ICD-10-CM | POA: Diagnosis not present

## 2022-03-14 DIAGNOSIS — M25471 Effusion, right ankle: Secondary | ICD-10-CM | POA: Diagnosis not present

## 2022-03-14 DIAGNOSIS — M25672 Stiffness of left ankle, not elsewhere classified: Secondary | ICD-10-CM | POA: Diagnosis not present

## 2022-03-17 DIAGNOSIS — R269 Unspecified abnormalities of gait and mobility: Secondary | ICD-10-CM | POA: Diagnosis not present

## 2022-03-17 DIAGNOSIS — M25671 Stiffness of right ankle, not elsewhere classified: Secondary | ICD-10-CM | POA: Diagnosis not present

## 2022-03-17 DIAGNOSIS — M6259 Muscle wasting and atrophy, not elsewhere classified, multiple sites: Secondary | ICD-10-CM | POA: Diagnosis not present

## 2022-03-17 DIAGNOSIS — M25471 Effusion, right ankle: Secondary | ICD-10-CM | POA: Diagnosis not present

## 2022-03-17 DIAGNOSIS — M25672 Stiffness of left ankle, not elsewhere classified: Secondary | ICD-10-CM | POA: Diagnosis not present

## 2022-03-17 DIAGNOSIS — M25472 Effusion, left ankle: Secondary | ICD-10-CM | POA: Diagnosis not present

## 2022-03-21 DIAGNOSIS — R269 Unspecified abnormalities of gait and mobility: Secondary | ICD-10-CM | POA: Diagnosis not present

## 2022-03-21 DIAGNOSIS — M25472 Effusion, left ankle: Secondary | ICD-10-CM | POA: Diagnosis not present

## 2022-03-21 DIAGNOSIS — M25471 Effusion, right ankle: Secondary | ICD-10-CM | POA: Diagnosis not present

## 2022-03-21 DIAGNOSIS — M25671 Stiffness of right ankle, not elsewhere classified: Secondary | ICD-10-CM | POA: Diagnosis not present

## 2022-03-21 DIAGNOSIS — M25672 Stiffness of left ankle, not elsewhere classified: Secondary | ICD-10-CM | POA: Diagnosis not present

## 2022-03-21 DIAGNOSIS — M6259 Muscle wasting and atrophy, not elsewhere classified, multiple sites: Secondary | ICD-10-CM | POA: Diagnosis not present

## 2022-03-24 DIAGNOSIS — M25672 Stiffness of left ankle, not elsewhere classified: Secondary | ICD-10-CM | POA: Diagnosis not present

## 2022-03-24 DIAGNOSIS — M6259 Muscle wasting and atrophy, not elsewhere classified, multiple sites: Secondary | ICD-10-CM | POA: Diagnosis not present

## 2022-03-24 DIAGNOSIS — M25671 Stiffness of right ankle, not elsewhere classified: Secondary | ICD-10-CM | POA: Diagnosis not present

## 2022-03-24 DIAGNOSIS — R269 Unspecified abnormalities of gait and mobility: Secondary | ICD-10-CM | POA: Diagnosis not present

## 2022-03-24 DIAGNOSIS — M25472 Effusion, left ankle: Secondary | ICD-10-CM | POA: Diagnosis not present

## 2022-03-24 DIAGNOSIS — M25471 Effusion, right ankle: Secondary | ICD-10-CM | POA: Diagnosis not present

## 2022-03-28 DIAGNOSIS — M6259 Muscle wasting and atrophy, not elsewhere classified, multiple sites: Secondary | ICD-10-CM | POA: Diagnosis not present

## 2022-03-28 DIAGNOSIS — M25472 Effusion, left ankle: Secondary | ICD-10-CM | POA: Diagnosis not present

## 2022-03-28 DIAGNOSIS — M25672 Stiffness of left ankle, not elsewhere classified: Secondary | ICD-10-CM | POA: Diagnosis not present

## 2022-03-28 DIAGNOSIS — M25471 Effusion, right ankle: Secondary | ICD-10-CM | POA: Diagnosis not present

## 2022-03-28 DIAGNOSIS — M25671 Stiffness of right ankle, not elsewhere classified: Secondary | ICD-10-CM | POA: Diagnosis not present

## 2022-03-28 DIAGNOSIS — R269 Unspecified abnormalities of gait and mobility: Secondary | ICD-10-CM | POA: Diagnosis not present

## 2022-03-31 DIAGNOSIS — Z452 Encounter for adjustment and management of vascular access device: Secondary | ICD-10-CM | POA: Diagnosis not present

## 2022-03-31 DIAGNOSIS — C8330 Diffuse large B-cell lymphoma, unspecified site: Secondary | ICD-10-CM | POA: Diagnosis not present

## 2022-04-01 DIAGNOSIS — I10 Essential (primary) hypertension: Secondary | ICD-10-CM | POA: Diagnosis not present

## 2022-04-01 DIAGNOSIS — R269 Unspecified abnormalities of gait and mobility: Secondary | ICD-10-CM | POA: Diagnosis not present

## 2022-04-01 DIAGNOSIS — Z0000 Encounter for general adult medical examination without abnormal findings: Secondary | ICD-10-CM | POA: Diagnosis not present

## 2022-04-04 DIAGNOSIS — R269 Unspecified abnormalities of gait and mobility: Secondary | ICD-10-CM | POA: Diagnosis not present

## 2022-04-04 DIAGNOSIS — M6259 Muscle wasting and atrophy, not elsewhere classified, multiple sites: Secondary | ICD-10-CM | POA: Diagnosis not present

## 2022-04-04 DIAGNOSIS — M25671 Stiffness of right ankle, not elsewhere classified: Secondary | ICD-10-CM | POA: Diagnosis not present

## 2022-04-04 DIAGNOSIS — M25672 Stiffness of left ankle, not elsewhere classified: Secondary | ICD-10-CM | POA: Diagnosis not present

## 2022-04-04 DIAGNOSIS — M25471 Effusion, right ankle: Secondary | ICD-10-CM | POA: Diagnosis not present

## 2022-04-04 DIAGNOSIS — M25472 Effusion, left ankle: Secondary | ICD-10-CM | POA: Diagnosis not present

## 2022-04-07 DIAGNOSIS — R269 Unspecified abnormalities of gait and mobility: Secondary | ICD-10-CM | POA: Diagnosis not present

## 2022-04-07 DIAGNOSIS — M25471 Effusion, right ankle: Secondary | ICD-10-CM | POA: Diagnosis not present

## 2022-04-07 DIAGNOSIS — M25671 Stiffness of right ankle, not elsewhere classified: Secondary | ICD-10-CM | POA: Diagnosis not present

## 2022-04-07 DIAGNOSIS — M25672 Stiffness of left ankle, not elsewhere classified: Secondary | ICD-10-CM | POA: Diagnosis not present

## 2022-04-07 DIAGNOSIS — M6259 Muscle wasting and atrophy, not elsewhere classified, multiple sites: Secondary | ICD-10-CM | POA: Diagnosis not present

## 2022-04-07 DIAGNOSIS — M25472 Effusion, left ankle: Secondary | ICD-10-CM | POA: Diagnosis not present

## 2022-04-11 DIAGNOSIS — M25671 Stiffness of right ankle, not elsewhere classified: Secondary | ICD-10-CM | POA: Diagnosis not present

## 2022-04-11 DIAGNOSIS — R269 Unspecified abnormalities of gait and mobility: Secondary | ICD-10-CM | POA: Diagnosis not present

## 2022-04-11 DIAGNOSIS — M6259 Muscle wasting and atrophy, not elsewhere classified, multiple sites: Secondary | ICD-10-CM | POA: Diagnosis not present

## 2022-04-11 DIAGNOSIS — M25471 Effusion, right ankle: Secondary | ICD-10-CM | POA: Diagnosis not present

## 2022-04-11 DIAGNOSIS — M25672 Stiffness of left ankle, not elsewhere classified: Secondary | ICD-10-CM | POA: Diagnosis not present

## 2022-04-11 DIAGNOSIS — M25472 Effusion, left ankle: Secondary | ICD-10-CM | POA: Diagnosis not present

## 2022-04-14 DIAGNOSIS — M6259 Muscle wasting and atrophy, not elsewhere classified, multiple sites: Secondary | ICD-10-CM | POA: Diagnosis not present

## 2022-04-14 DIAGNOSIS — R269 Unspecified abnormalities of gait and mobility: Secondary | ICD-10-CM | POA: Diagnosis not present

## 2022-04-14 DIAGNOSIS — M25671 Stiffness of right ankle, not elsewhere classified: Secondary | ICD-10-CM | POA: Diagnosis not present

## 2022-04-14 DIAGNOSIS — M25471 Effusion, right ankle: Secondary | ICD-10-CM | POA: Diagnosis not present

## 2022-04-14 DIAGNOSIS — M25672 Stiffness of left ankle, not elsewhere classified: Secondary | ICD-10-CM | POA: Diagnosis not present

## 2022-04-14 DIAGNOSIS — M25472 Effusion, left ankle: Secondary | ICD-10-CM | POA: Diagnosis not present

## 2022-04-16 ENCOUNTER — Encounter: Payer: Self-pay | Admitting: Podiatry

## 2022-04-16 ENCOUNTER — Ambulatory Visit (INDEPENDENT_AMBULATORY_CARE_PROVIDER_SITE_OTHER): Payer: Medicare Other | Admitting: Podiatry

## 2022-04-16 DIAGNOSIS — M79675 Pain in left toe(s): Secondary | ICD-10-CM | POA: Diagnosis not present

## 2022-04-16 DIAGNOSIS — M79674 Pain in right toe(s): Secondary | ICD-10-CM

## 2022-04-16 DIAGNOSIS — B351 Tinea unguium: Secondary | ICD-10-CM | POA: Diagnosis not present

## 2022-04-16 NOTE — Progress Notes (Signed)
Subjective:   Patient ID: Lauren Santana, female   DOB: 71 y.o.   MRN: [id number]   HPI Patient presents with elongated nailbeds 1-5 both feet that are thick yellow brittle and are painful when pressed   ROS      Objective:  Physical Exam  Thick yellow brittle nailbeds 1-5 both feet painful with incurvation of the sides     Assessment:  Chronic mycotic nail infection with pain 1-5 both feet with patient being diabetic     Plan:  Diabetic care discussed debrided nailbeds 1-5 both feet no iatrogenic bleeding reappoint routine care

## 2022-04-18 DIAGNOSIS — M25671 Stiffness of right ankle, not elsewhere classified: Secondary | ICD-10-CM | POA: Diagnosis not present

## 2022-04-18 DIAGNOSIS — M25472 Effusion, left ankle: Secondary | ICD-10-CM | POA: Diagnosis not present

## 2022-04-18 DIAGNOSIS — M25672 Stiffness of left ankle, not elsewhere classified: Secondary | ICD-10-CM | POA: Diagnosis not present

## 2022-04-18 DIAGNOSIS — M25471 Effusion, right ankle: Secondary | ICD-10-CM | POA: Diagnosis not present

## 2022-04-18 DIAGNOSIS — R269 Unspecified abnormalities of gait and mobility: Secondary | ICD-10-CM | POA: Diagnosis not present

## 2022-04-18 DIAGNOSIS — M6259 Muscle wasting and atrophy, not elsewhere classified, multiple sites: Secondary | ICD-10-CM | POA: Diagnosis not present

## 2022-04-25 DIAGNOSIS — M25671 Stiffness of right ankle, not elsewhere classified: Secondary | ICD-10-CM | POA: Diagnosis not present

## 2022-04-25 DIAGNOSIS — M6259 Muscle wasting and atrophy, not elsewhere classified, multiple sites: Secondary | ICD-10-CM | POA: Diagnosis not present

## 2022-04-25 DIAGNOSIS — M25672 Stiffness of left ankle, not elsewhere classified: Secondary | ICD-10-CM | POA: Diagnosis not present

## 2022-04-25 DIAGNOSIS — M25471 Effusion, right ankle: Secondary | ICD-10-CM | POA: Diagnosis not present

## 2022-04-25 DIAGNOSIS — R269 Unspecified abnormalities of gait and mobility: Secondary | ICD-10-CM | POA: Diagnosis not present

## 2022-04-25 DIAGNOSIS — M25472 Effusion, left ankle: Secondary | ICD-10-CM | POA: Diagnosis not present

## 2022-05-02 DIAGNOSIS — M25672 Stiffness of left ankle, not elsewhere classified: Secondary | ICD-10-CM | POA: Diagnosis not present

## 2022-05-02 DIAGNOSIS — R269 Unspecified abnormalities of gait and mobility: Secondary | ICD-10-CM | POA: Diagnosis not present

## 2022-05-02 DIAGNOSIS — M25671 Stiffness of right ankle, not elsewhere classified: Secondary | ICD-10-CM | POA: Diagnosis not present

## 2022-05-02 DIAGNOSIS — M6259 Muscle wasting and atrophy, not elsewhere classified, multiple sites: Secondary | ICD-10-CM | POA: Diagnosis not present

## 2022-05-02 DIAGNOSIS — M25472 Effusion, left ankle: Secondary | ICD-10-CM | POA: Diagnosis not present

## 2022-05-02 DIAGNOSIS — M25471 Effusion, right ankle: Secondary | ICD-10-CM | POA: Diagnosis not present

## 2022-05-09 DIAGNOSIS — M6259 Muscle wasting and atrophy, not elsewhere classified, multiple sites: Secondary | ICD-10-CM | POA: Diagnosis not present

## 2022-05-09 DIAGNOSIS — M25472 Effusion, left ankle: Secondary | ICD-10-CM | POA: Diagnosis not present

## 2022-05-09 DIAGNOSIS — M25471 Effusion, right ankle: Secondary | ICD-10-CM | POA: Diagnosis not present

## 2022-05-09 DIAGNOSIS — M25671 Stiffness of right ankle, not elsewhere classified: Secondary | ICD-10-CM | POA: Diagnosis not present

## 2022-05-09 DIAGNOSIS — M25672 Stiffness of left ankle, not elsewhere classified: Secondary | ICD-10-CM | POA: Diagnosis not present

## 2022-05-09 DIAGNOSIS — R269 Unspecified abnormalities of gait and mobility: Secondary | ICD-10-CM | POA: Diagnosis not present

## 2022-05-16 DIAGNOSIS — M25671 Stiffness of right ankle, not elsewhere classified: Secondary | ICD-10-CM | POA: Diagnosis not present

## 2022-05-16 DIAGNOSIS — M25672 Stiffness of left ankle, not elsewhere classified: Secondary | ICD-10-CM | POA: Diagnosis not present

## 2022-05-16 DIAGNOSIS — M6259 Muscle wasting and atrophy, not elsewhere classified, multiple sites: Secondary | ICD-10-CM | POA: Diagnosis not present

## 2022-05-16 DIAGNOSIS — M25472 Effusion, left ankle: Secondary | ICD-10-CM | POA: Diagnosis not present

## 2022-05-16 DIAGNOSIS — M25471 Effusion, right ankle: Secondary | ICD-10-CM | POA: Diagnosis not present

## 2022-05-16 DIAGNOSIS — R269 Unspecified abnormalities of gait and mobility: Secondary | ICD-10-CM | POA: Diagnosis not present

## 2022-05-23 DIAGNOSIS — M25671 Stiffness of right ankle, not elsewhere classified: Secondary | ICD-10-CM | POA: Diagnosis not present

## 2022-05-23 DIAGNOSIS — M25672 Stiffness of left ankle, not elsewhere classified: Secondary | ICD-10-CM | POA: Diagnosis not present

## 2022-05-23 DIAGNOSIS — M25472 Effusion, left ankle: Secondary | ICD-10-CM | POA: Diagnosis not present

## 2022-05-23 DIAGNOSIS — M6259 Muscle wasting and atrophy, not elsewhere classified, multiple sites: Secondary | ICD-10-CM | POA: Diagnosis not present

## 2022-05-23 DIAGNOSIS — M25471 Effusion, right ankle: Secondary | ICD-10-CM | POA: Diagnosis not present

## 2022-05-23 DIAGNOSIS — R269 Unspecified abnormalities of gait and mobility: Secondary | ICD-10-CM | POA: Diagnosis not present

## 2022-05-30 DIAGNOSIS — R269 Unspecified abnormalities of gait and mobility: Secondary | ICD-10-CM | POA: Diagnosis not present

## 2022-05-30 DIAGNOSIS — M25672 Stiffness of left ankle, not elsewhere classified: Secondary | ICD-10-CM | POA: Diagnosis not present

## 2022-05-30 DIAGNOSIS — M25472 Effusion, left ankle: Secondary | ICD-10-CM | POA: Diagnosis not present

## 2022-05-30 DIAGNOSIS — M25471 Effusion, right ankle: Secondary | ICD-10-CM | POA: Diagnosis not present

## 2022-05-30 DIAGNOSIS — M6259 Muscle wasting and atrophy, not elsewhere classified, multiple sites: Secondary | ICD-10-CM | POA: Diagnosis not present

## 2022-05-30 DIAGNOSIS — M25671 Stiffness of right ankle, not elsewhere classified: Secondary | ICD-10-CM | POA: Diagnosis not present

## 2022-06-06 DIAGNOSIS — M25472 Effusion, left ankle: Secondary | ICD-10-CM | POA: Diagnosis not present

## 2022-06-06 DIAGNOSIS — M25672 Stiffness of left ankle, not elsewhere classified: Secondary | ICD-10-CM | POA: Diagnosis not present

## 2022-06-06 DIAGNOSIS — R269 Unspecified abnormalities of gait and mobility: Secondary | ICD-10-CM | POA: Diagnosis not present

## 2022-06-06 DIAGNOSIS — M25671 Stiffness of right ankle, not elsewhere classified: Secondary | ICD-10-CM | POA: Diagnosis not present

## 2022-06-06 DIAGNOSIS — M25471 Effusion, right ankle: Secondary | ICD-10-CM | POA: Diagnosis not present

## 2022-06-06 DIAGNOSIS — M6259 Muscle wasting and atrophy, not elsewhere classified, multiple sites: Secondary | ICD-10-CM | POA: Diagnosis not present

## 2022-07-03 DIAGNOSIS — R609 Edema, unspecified: Secondary | ICD-10-CM | POA: Diagnosis not present

## 2022-07-03 DIAGNOSIS — I10 Essential (primary) hypertension: Secondary | ICD-10-CM | POA: Diagnosis not present

## 2022-07-03 DIAGNOSIS — K219 Gastro-esophageal reflux disease without esophagitis: Secondary | ICD-10-CM | POA: Diagnosis not present

## 2022-07-10 DIAGNOSIS — G629 Polyneuropathy, unspecified: Secondary | ICD-10-CM | POA: Diagnosis not present

## 2022-07-10 DIAGNOSIS — R110 Nausea: Secondary | ICD-10-CM | POA: Diagnosis not present

## 2022-07-10 DIAGNOSIS — K118 Other diseases of salivary glands: Secondary | ICD-10-CM | POA: Diagnosis not present

## 2022-07-10 DIAGNOSIS — I890 Lymphedema, not elsewhere classified: Secondary | ICD-10-CM | POA: Diagnosis not present

## 2022-07-10 DIAGNOSIS — E538 Deficiency of other specified B group vitamins: Secondary | ICD-10-CM | POA: Diagnosis not present

## 2022-07-10 DIAGNOSIS — C8330 Diffuse large B-cell lymphoma, unspecified site: Secondary | ICD-10-CM | POA: Diagnosis not present

## 2022-07-16 ENCOUNTER — Telehealth: Payer: Self-pay | Admitting: Podiatry

## 2022-07-16 ENCOUNTER — Encounter: Payer: Self-pay | Admitting: Podiatry

## 2022-07-16 ENCOUNTER — Ambulatory Visit (INDEPENDENT_AMBULATORY_CARE_PROVIDER_SITE_OTHER): Payer: Medicare Other | Admitting: Podiatry

## 2022-07-16 DIAGNOSIS — E1149 Type 2 diabetes mellitus with other diabetic neurological complication: Secondary | ICD-10-CM

## 2022-07-16 DIAGNOSIS — B351 Tinea unguium: Secondary | ICD-10-CM | POA: Diagnosis not present

## 2022-07-16 DIAGNOSIS — M79675 Pain in left toe(s): Secondary | ICD-10-CM

## 2022-07-16 DIAGNOSIS — M79674 Pain in right toe(s): Secondary | ICD-10-CM | POA: Diagnosis not present

## 2022-07-16 DIAGNOSIS — G629 Polyneuropathy, unspecified: Secondary | ICD-10-CM

## 2022-07-16 DIAGNOSIS — R609 Edema, unspecified: Secondary | ICD-10-CM

## 2022-07-16 NOTE — Progress Notes (Signed)
  Subjective:  Patient ID: Lauren Santana, female    DOB: September 24, 1951,   MRN: [id number]  Chief Complaint  Patient presents with   Foot Swelling    Patient states she has neuropathy and she is concern about the swelling in her feet and her eczema.. on her feet RFC      71 y.o. female presents for concern of neuropathy and swelling in her feet. As well as concern of thickened elongated and painful nails that are difficult to trim. Requesting to have them trimmed today. Relates burning and tingling in their feet. Patient is diabetic and last A1c was  Lab Results  Component Value Date   HGBA1C 4.6 (L) 08/16/2020   .  Relates she has been in PT and that has been helping with neuropathy and PCP sent in referral for lyphedema clinic and wanting to know if this would be appropriate.   PCP:  Lucianne Lei, MD    . Denies any other pedal complaints. Denies n/v/f/c.   Past Medical History:  Diagnosis Date   Arthritis    Edema    B/LLE   Endometriosis    High cholesterol    Hypertension    Numbness    feet   Pre-diabetes    Pulmonary embolism (HCC)    recurrent, second episode in 06/2020   Walker as ambulation aid    Wears dentures    Wears glasses     Objective:  Physical Exam: Vascular: DP/PT pulses 2/4 bilateral. CFT <3 seconds. Absent hair growth on digits. Edema noted to bilateral lower extremities. Xerosis noted bilaterally.  Skin. No lacerations or abrasions bilateral feet. Nails 1-5 bilateral  are thickened discolored and elongated with subungual debris.  Musculoskeletal: MMT 5/5 bilateral lower extremities in DF, PF, Inversion and Eversion. Deceased ROM in DF of ankle joint.  Neurological: Sensation intact to light touch. Protective sensation diminished bilateral.    Assessment:   1. Pain due to onychomycosis of toenails of both feet   2. Type II diabetes mellitus with neurological manifestations ([place])   3. Swelling   4. Neuropathy      Plan:  Patient was  evaluated and treated and all questions answered. -Discussed and educated patient on diabetic foot care, especially with  regards to the vascular, neurological and musculoskeletal systems.  -Stressed the importance of good glycemic control and the detriment of not  controlling glucose levels in relation to the foot. -Discussed supportive shoes at all times and checking feet regularly.  -Mechanically debrided all nails 1-5 bilateral using sterile nail nipper and filed with dremel without incident  -Discussed following up with PT and lymphedema clinic as this will likely be helpful for her. .  -Answered all patient questions -Patient to return  in 3 months for at risk foot care -Patient advised to call the office if any problems or questions arise in the meantime.   Lorenda Peck, DPM

## 2022-07-16 NOTE — Telephone Encounter (Signed)
Pt states that her feet are swollen and she has neuropathy. She is demanding to be seen today and states that her mind is neurotic as it pertains to her feet hurting and feels she need to be seen asap.   Please advise.

## 2022-07-20 NOTE — Therapy (Signed)
OUTPATIENT PHYSICAL THERAPY ONCOLOGY EVALUATION  Patient Name: Lauren Santana MRN: [id number] DOB:10-06-1951, 71 y.o., female Today's Date: 07/21/2022   PT End of Session - 07/21/22 1408     Visit Number 1    Number of Visits 18    Date for PT Re-Evaluation 09/01/22    PT Start Time 1408    PT Stop Time 1500    PT Time Calculation (min) 52 min    Activity Tolerance Patient tolerated treatment well    Behavior During Therapy [facility] for tasks assessed/performed             Past Medical History:  Diagnosis Date   Arthritis    Edema    B/LLE   Endometriosis    High cholesterol    Hypertension    Numbness    feet   Pre-diabetes    Pulmonary embolism ([place])    recurrent, second episode in 06/2020   Walker as ambulation aid    Wears dentures    Wears glasses    Past Surgical History:  Procedure Laterality Date   ABDOMINAL HYSTERECTOMY     and BSO   CATARACT EXTRACTION Left 10/2018   CESAREAN [place], 1971   COLONOSCOPY     EYE SURGERY Bilateral    CATARACT SX   HERNIA REPAIR  2001   incisional   IR CATHETER TUBE CHANGE  10/12/2020   IR CATHETER TUBE CHANGE  12/07/2020   IR CHOLANGIOGRAM EXISTING TUBE  09/28/2020   IR PERC CHOLECYSTOSTOMY  08/17/2020   IR RADIOLOGIST EVAL & MGMT  11/22/2020   JOINT REPLACEMENT Left    Knee   MAXILLARY ANTROSTOMY Right 01/20/2020   Procedure: MAXILLARY ANTROSTOMY  WITH BIOPSY CALDWELL APPROACH;  Surgeon: Jerrell Belfast, MD;  Location: [place];  Service: ENT;  Laterality: Right;   MULTIPLE TOOTH EXTRACTIONS  2011   NASAL ENDOSCOPY Right 02/17/2020   Procedure: NASAL ENDOSCOPY WITH RIGHT INFERIOR TURNBINATE REDUCTION;  Surgeon: Jerrell Belfast, MD;  Location: [facility] OR;  Service: ENT;  Laterality: Right;   neck mass removal     "fatty tissue, not cancerous" per pt's son   SINUS ENDO WITH FUSION Right 01/20/2020   Procedure: SINUS ENDO WITH FUSION WITH BIOSPY;  Surgeon: Jerrell Belfast, MD;  Location: [place];  Service: ENT;   Laterality: Right;   TOTAL KNEE ARTHROPLASTY Left 07/17/2014   Procedure: TOTAL KNEE ARTHROPLASTY;  Surgeon: Kerin Salen, MD;  Location: [place];  Service: Orthopedics;  Laterality: Left;   Patient Active Problem List   Diagnosis Date Noted   Anticoagulated 12/11/2020   Cholecystitis, acute 08/16/2020   Abdominal pain 08/16/2020   Severe sepsis ([place]) 08/16/2020   Pressure injury of skin 08/16/2020   Atrial fibrillation ([place]) 07/24/2020   Thrombocytopenia ([place]) 07/24/2020   Anemia of chronic disease 07/24/2020   Chronic pain 07/24/2020   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[place]) 07/24/2020   Pulmonary embolism ([place]) 07/19/2020   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   S/P BSO (bilateral salpingo-oophorectomy) 01/08/2017   S/P hysterectomy 01/08/2017   Tachycardia    Acute deep vein thrombosis (DVT) of right lower extremity ([place]) 02/14/2016   Pulmonary emboli ([place]) 02/13/2016   PE (pulmonary embolism) 02/13/2016   AKI (acute kidney injury) ([place]) 02/13/2016   Pulmonary embolism with acute cor pulmonale (HCC)    Pelvic mass in female    Obesity (BMI 30-39.9) 08/20/2015   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   Essential hypertension, benign 07/23/2014  Edema 07/23/2014   Dyslipidemia 07/23/2014   Lower extremity numbness 07/23/2014   Arthritis of knee 07/17/2014   Postmenopausal bleeding 03/10/2012   Endometriosis of pelvis 01/22/2007    PCP: Beatrix Shipper, MD  REFERRING PROVIDER: [name], MD  REFERRING DIAG: Diffuse large B cell Lymphoma  THERAPY DIAG:  Lymphoma, large-cell, diffuse ([place])  Lymphedema  ONSET DATE: 2 years ago  Rationale for Evaluation and Treatment Rehabilitation  SUBJECTIVE                                                                                                                                                                                           SUBJECTIVE STATEMENT: Pt was treated for neuropathy, but she  has had swelling for several years that was never addressed. She has compression stockings, but she has trouble getting them on and hasn't been wearing them.  PERTINENT HISTORY:  The patient is a 71 year old woman with a history of DLBCL.She has completed 6 cycles of R-mini CHOP with cycle 6-day 1 being 10/11/2020.  She has chronic Nausea/vomiting but she takes some liquid Sulcrafate and this has helped.  At her office visit  in April with MD she had no clinical signs consistent with disease reoccurence.  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  She has taken furosemide for 2 years with no improvement. She has compression stockings but she has trouble getting them on and does not wear them. She is here with her son Lauren Santana.(Friend of Dr. Ron Agee)  PAIN:  Are you having pain? Yesfrom neuropathy NPRS scale: unable to rate/10 Pain location: bilateral feet. Pain orientation: Bilateral  PAIN TYPE: nervy Pain description: constant  Aggravating factors: nothing Relieving factors: nothing  PRECAUTIONS: Other: B cell lymphoma, DM  WEIGHT BEARING RESTRICTIONS No  FALLS:  Has patient fallen in last 6 months? no  LIVING ENVIRONMENT: Lives with: Lives Alone Lives in: House/apartment Stairs: No;  Has following equipment at home: Environmental consultant - 4 wheeled, Wheelchair (manual), shower chair, Shower bench, and bed side commode  OCCUPATION: retired Aeronautical engineer  LEISURE: reading, prayer, encourager to family and friends  HAND DOMINANCE : right   PRIOR LEVEL OF FUNCTION: Independent with household mobility with device, even does her own sweeping and mopping  PATIENT GOALS Decrease leg swelling   OBJECTIVE  COGNITION:  Overall cognitive status: Within functional limits for tasks assessed   PALPATION: Fibrosis noted especially right calf region, mild pitting without tenderness at bilateral lower legs  OBSERVATIONS /  OTHER ASSESSMENTS: Right leg with increased swelling greater than left,  ankles very swollen above her slipper but reduced at foot.  SENSATION:  Light touch: Appears intact    POSTURE: forward head, rounded shoulders    UPPER EXTREMITY STRENGTH: NT   LOWER EXTREMITY MMT: NT  LYMPHEDEMA ASSESSMENTS:   SURGERY TYPE/DATE: NA  NUMBER OF LYMPH NODES REMOVED: NA  CHEMOTHERAPY: Yes  RADIATION:no  HORMONE TREATMENT: no  INFECTIONS: NA  LYMPHEDEMA ASSESSMENTS:     LOWER EXTREMITY LANDMARK RIGHT eval  At groin   30 cm proximal to suprapatella   20 cm proximal to suprapatella   10 cm proximal to suprapatella   At midpatella / popliteal crease 47.3  30 cm proximal to floor at lateral plantar foot 54.6  20 cm proximal to floor at lateral plantar foot 39.9  10 cm proximal to floor at lateral plantar foot 34.3  Circumference of ankle/heel   5 cm proximal to 1st MTP joint 27.0  Across MTP joint 24.3  Around proximal great toe 9.4  (Blank rows = not tested)  LOWER EXTREMITY LANDMARK LEFT eval  At groin   30 cm proximal to suprapatella   20 cm proximal to suprapatella   10 cm proximal to suprapatella   At midpatella / popliteal crease 43.4  30 cm proximal to floor at lateral plantar foot 48.5  20 cm proximal to floor at lateral plantar foot 37.4  10 cm proximal to floor at lateral plantar foot 29.4  Circumference of ankle/heel   5 cm proximal to 1st MTP joint 24.5  Across MTP joint 23.4  Around proximal great toe 8.2  (Blank rows = not tested)    GAIT: Distance walked: to room and back to front office after rx Assistive device utilized: Environmental consultant - 4 wheeled Level of assistance: Modified independence Comments: flexed posture, decreased heel strike toe o    TODAY'S TREATMENT  Discussed treatment including compression bandaging, MLD, Remedial exercise, and different forms of compression garments. Pt might benefit from Velcro wraps instead of normal compression  garments. Discussed # of visits per week and length of stay, need for different footwear ie cast shoes which pt has, and possibly having someone we can train to wrap so she can shower/bathe  PATIENT EDUCATION:  Education details: Treatment plan, types of compression, footwear, Person educated: Patient and Child(ren),son Chartered loss adjuster method: Explanation Education comprehension: verbalized understanding   HOME EXERCISE PROGRAM: NA  ASSESSMENT:  CLINICAL IMPRESSION: Patient is a 71 y.o. female who was seen today for physical therapy evaluation and treatment for Bilateral LE Lymphedema. Pt has a hx of DVT and PE and is currently on Xarelto. She has diffuse Large  B Cell Lymphoma which has been treated and there is currently no signs of disease progression per MD note in computer and pt. She has significant increased edema in bilateral LE's greatest on the right. There is palpable fibrosis greatest in the right calf, and diffusely in bilateral LE's and mild pitting proximal to both ankles. She will benefit from skilled PT to reduce swelling, and decrease her risk of infection   OBJECTIVE IMPAIRMENTS decreased activity tolerance, difficulty walking, and increased edema.   ACTIVITY LIMITATIONS carrying, standing, stairs, bed mobility, and locomotion levelactivities requiring standing, stairs, extended walking  PARTICIPATION LIMITATIONS:  as above  PERSONAL FACTORS Age, Time since onset of injury/illness/exacerbation, and 1-2 comorbidities: Lymphoma, prioer DVT/PE  are also affecting patient's functional outcome.   REHAB POTENTIAL: Excellent  CLINICAL DECISION MAKING: Stable/uncomplicated  EVALUATION COMPLEXITY: Low  GOALS: Goals reviewed with patient? Yes  SHORT  TERM GOALS: Target date: 08/11/2022    Pt will have decreased edema at 10 cm prox to floor by atleast 2.5 cm Baseline LEFT 29.4, RIGHT 34.3 Goal status: INITIAL  2.  Pt will have decreased edema at 30 cm prox to floor by 3  cm Baseline: Left 54.6, Right 48.5 Goal status: INITIAL  3.  Pts. Family member will be independent in compression bandaging so pt may bathe at home. Baseline: dependent Goal status: INITIAL  LONG TERM GOALS: Target date: 09/01/2022    Pt will have decreased edema at 10 cm prox to floor by 4 cm Baseline: left 37.4, Right 34/3 Goal status: INITIAL  2.  Pt will have edema reduction at 30 cm prox to floor by 5 cm Baseline: Left 54.6, right 48.5 Goal status: INITIAL  3.  Pt will be fit with appropriate compression garments;likely velcro wrap, and will be independent in their use Baseline:  Goal status: INITIAL  4.  Pt will have flexitouch demo to determine her desire for having the pump to assist with swelling Baseline:  Goal status: INITIAL   PLAN: PT FREQUENCY: 3x/week  PT DURATION: 6 weeks  PLANNED INTERVENTIONS: Therapeutic exercises, Patient/Family education, Self Care, Orthotic/Fit training, Manual lymph drainage, Compression bandaging, Vasopneumatic device, Manual therapy, and Re-evaluation  PLAN FOR NEXT SESSION: share info on Flexi and see if she wants demographics sent, initiate MLD and wrapping, My want to just wrap right leg first to be sure she tolerates OK and then next visit to bilateral LE's toes -Knees as long as she can get around OK.   Claris Pong, PT 07/21/2022, 4:06 PM

## 2022-07-21 ENCOUNTER — Ambulatory Visit: Payer: Medicare Other | Attending: Internal Medicine

## 2022-07-21 ENCOUNTER — Other Ambulatory Visit: Payer: Self-pay

## 2022-07-21 DIAGNOSIS — I890 Lymphedema, not elsewhere classified: Secondary | ICD-10-CM | POA: Diagnosis not present

## 2022-07-21 DIAGNOSIS — C8330 Diffuse large B-cell lymphoma, unspecified site: Secondary | ICD-10-CM | POA: Insufficient documentation

## 2022-07-23 ENCOUNTER — Encounter: Payer: Medicare Other | Admitting: Rehabilitation

## 2022-07-25 ENCOUNTER — Encounter: Payer: Medicare Other | Admitting: Rehabilitation

## 2022-07-29 ENCOUNTER — Telehealth: Payer: Self-pay

## 2022-07-29 ENCOUNTER — Ambulatory Visit: Payer: Medicare Other | Admitting: Physical Therapy

## 2022-07-29 NOTE — Telephone Encounter (Signed)
Called pt at her request. She is feeling anxious about treatment and having her legs wrapped. She is also worried about having someone help her at home with sweeping, mopping and housework since she uses a walker. Explained that I would not use wraps that would prevent her from doing what she needs to do at home, but that someone to help would be beneficial regardless of if she does the treatment that requires wrapping. Also discussed the possibility of velcro reduction kit but would still require monitoring. Pt. Requests to cancel treatment this week, and will try to get rides and caregiver in place and will call next week. Advised pt to speak with her son [name].

## 2022-07-31 ENCOUNTER — Ambulatory Visit: Payer: Medicare Other

## 2022-08-04 ENCOUNTER — Ambulatory Visit: Payer: Medicare Other | Attending: Internal Medicine

## 2022-08-04 DIAGNOSIS — I890 Lymphedema, not elsewhere classified: Secondary | ICD-10-CM | POA: Insufficient documentation

## 2022-08-04 DIAGNOSIS — C8330 Diffuse large B-cell lymphoma, unspecified site: Secondary | ICD-10-CM | POA: Insufficient documentation

## 2022-08-04 DIAGNOSIS — K5900 Constipation, unspecified: Secondary | ICD-10-CM | POA: Diagnosis not present

## 2022-08-04 DIAGNOSIS — R112 Nausea with vomiting, unspecified: Secondary | ICD-10-CM | POA: Diagnosis not present

## 2022-08-06 ENCOUNTER — Ambulatory Visit: Payer: Medicare Other

## 2022-08-06 DIAGNOSIS — I890 Lymphedema, not elsewhere classified: Secondary | ICD-10-CM

## 2022-08-06 DIAGNOSIS — C8330 Diffuse large B-cell lymphoma, unspecified site: Secondary | ICD-10-CM | POA: Diagnosis not present

## 2022-08-06 NOTE — Therapy (Signed)
OUTPATIENT PHYSICAL THERAPY ONCOLOGY TREATMENT  Patient Name: LUMEN BRINLEE MRN: [id number] DOB:Sep 05, 1951, 71 y.o., female Today's Date: 08/06/2022   PT End of Session - 08/06/22 1106     Visit Number 2    Number of Visits 18    Date for PT Re-Evaluation 09/01/22    PT Start Time 1107    PT Stop Time 1155    PT Time Calculation (min) 48 min    Activity Tolerance Patient tolerated treatment well    Behavior During Therapy [facility] for tasks assessed/performed             Past Medical History:  Diagnosis Date   Arthritis    Edema    B/LLE   Endometriosis    High cholesterol    Hypertension    Numbness    feet   Pre-diabetes    Pulmonary embolism (HCC)    recurrent, second episode in 06/2020   Walker as ambulation aid    Wears dentures    Wears glasses    Past Surgical History:  Procedure Laterality Date   ABDOMINAL HYSTERECTOMY     and BSO   CATARACT EXTRACTION Left 10/2018   CESAREAN SECTION  1969, 1971   COLONOSCOPY     EYE SURGERY Bilateral    CATARACT SX   HERNIA REPAIR  2001   incisional   IR CATHETER TUBE CHANGE  10/12/2020   IR CATHETER TUBE CHANGE  12/07/2020   IR CHOLANGIOGRAM EXISTING TUBE  09/28/2020   IR PERC CHOLECYSTOSTOMY  08/17/2020   IR RADIOLOGIST EVAL & MGMT  11/22/2020   JOINT REPLACEMENT Left    Knee   MAXILLARY ANTROSTOMY Right 01/20/2020   Procedure: MAXILLARY ANTROSTOMY  WITH BIOPSY CALDWELL APPROACH;  Surgeon: Jerrell Belfast, MD;  Location: [facility];  Service: ENT;  Laterality: Right;   MULTIPLE TOOTH EXTRACTIONS  2011   NASAL ENDOSCOPY Right 02/17/2020   Procedure: NASAL ENDOSCOPY WITH RIGHT INFERIOR TURNBINATE REDUCTION;  Surgeon: Jerrell Belfast, MD;  Location: [facility] OR;  Service: ENT;  Laterality: Right;   neck mass removal     "fatty tissue, not cancerous" per pt's son   SINUS ENDO WITH FUSION Right 01/20/2020   Procedure: SINUS ENDO WITH FUSION WITH BIOSPY;  Surgeon: Jerrell Belfast, MD;  Location: [facility];  Service: ENT;   Laterality: Right;   TOTAL KNEE ARTHROPLASTY Left 07/17/2014   Procedure: TOTAL KNEE ARTHROPLASTY;  Surgeon: Kerin Salen, MD;  Location: [facility];  Service: Orthopedics;  Laterality: Left;   Patient Active Problem List   Diagnosis Date Noted   Anticoagulated 12/11/2020   Cholecystitis, acute 08/16/2020   Abdominal pain 08/16/2020   Severe sepsis ([place]) 08/16/2020   Pressure injury of skin 08/16/2020   Atrial fibrillation ([place]) 07/24/2020   Thrombocytopenia ([place]) 07/24/2020   Anemia of chronic disease 07/24/2020   Chronic pain 07/24/2020   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[place]) 07/24/2020   Pulmonary embolism ([place]) 07/19/2020   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   S/P BSO (bilateral salpingo-oophorectomy) 01/08/2017   S/P hysterectomy 01/08/2017   Tachycardia    Acute deep vein thrombosis (DVT) of right lower extremity ([place]) 02/14/2016   Pulmonary emboli ([place]) 02/13/2016   PE (pulmonary embolism) 02/13/2016   AKI (acute kidney injury) ([place]) 02/13/2016   Pulmonary embolism with acute cor pulmonale (HCC)    Pelvic mass in female    Obesity (BMI 30-39.9) 08/20/2015   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   Essential hypertension, benign 07/23/2014  Edema 07/23/2014   Dyslipidemia 07/23/2014   Lower extremity numbness 07/23/2014   Arthritis of knee 07/17/2014   Postmenopausal bleeding 03/10/2012   Endometriosis of pelvis 01/22/2007    PCP: Beatrix Shipper, MD  REFERRING PROVIDER: [name], MD  REFERRING DIAG: Diffuse large B cell Lymphoma  THERAPY DIAG:  Lymphoma, large-cell, diffuse ([place])  Lymphedema  ONSET DATE: 2 years ago  Rationale for Evaluation and Treatment Rehabilitation  SUBJECTIVE                                                                                                                                                                                           SUBJECTIVE STATEMENT: I have a lot of questions? Do you have  transportation if [name] can't bring me? Do you know someone that can come to my house a few hours a day to help me, Can we wrap 1 leg at a time. You know I get a little anxious.   PERTINENT HISTORY:  The patient is a 71 year old woman with a history of DLBCL.She has completed 6 cycles of R-mini CHOP with cycle 6-day 1 being 10/11/2020.  She has chronic Nausea/vomiting but she takes some liquid Sulcrafate and this has helped.  At her office visit  in April with MD she had no clinical signs consistent with disease reoccurence.  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  She has taken furosemide for 2 years with no improvement. She has compression stockings but she has trouble getting them on and does not wear them. She is here with her son [name].(Friend of Dr. Ron Agee)  PAIN:  Are you having pain? Yesfrom neuropathy NPRS scale: unable to rate/10 Pain location: bilateral feet. Pain orientation: Bilateral  PAIN TYPE: nervy Pain description: constant  Aggravating factors: nothing Relieving factors: nothing  PRECAUTIONS: Other: B cell lymphoma, DM  WEIGHT BEARING RESTRICTIONS No  FALLS:  Has patient fallen in last 6 months? no  LIVING ENVIRONMENT: Lives with: Lives Alone Lives in: House/apartment Stairs: No;  Has following equipment at home: Environmental consultant - 4 wheeled, Wheelchair (manual), shower chair, Shower bench, and bed side commode  OCCUPATION: retired Aeronautical engineer  LEISURE: reading, prayer, encourager to family and friends  HAND DOMINANCE : right   PRIOR LEVEL OF FUNCTION: Independent with household mobility with device, even does her own sweeping and mopping  PATIENT GOALS Decrease leg swelling   OBJECTIVE  COGNITION:  Overall cognitive status: Within functional limits for tasks assessed   PALPATION: Fibrosis noted especially right calf region, mild pitting without tenderness at bilateral  lower  legs  OBSERVATIONS / OTHER ASSESSMENTS: Right leg with increased swelling greater than left, ankles very swollen above her slipper but reduced at foot.  SENSATION:  Light touch: Appears intact    POSTURE: forward head, rounded shoulders    UPPER EXTREMITY STRENGTH: NT   LOWER EXTREMITY MMT: NT  LYMPHEDEMA ASSESSMENTS:   SURGERY TYPE/DATE: NA  NUMBER OF LYMPH NODES REMOVED: NA  CHEMOTHERAPY: Yes  RADIATION:no  HORMONE TREATMENT: no  INFECTIONS: NA  LYMPHEDEMA ASSESSMENTS:     LOWER EXTREMITY LANDMARK RIGHT eval  At groin   30 cm proximal to suprapatella   20 cm proximal to suprapatella   10 cm proximal to suprapatella   At midpatella / popliteal crease 47.3  30 cm proximal to floor at lateral plantar foot 54.6  20 cm proximal to floor at lateral plantar foot 39.9  10 cm proximal to floor at lateral plantar foot 34.3  Circumference of ankle/heel   5 cm proximal to 1st MTP joint 27.0  Across MTP joint 24.3  Around proximal great toe 9.4  (Blank rows = not tested)  LOWER EXTREMITY LANDMARK LEFT eval  At groin   30 cm proximal to suprapatella   20 cm proximal to suprapatella   10 cm proximal to suprapatella   At midpatella / popliteal crease 43.4  30 cm proximal to floor at lateral plantar foot 48.5  20 cm proximal to floor at lateral plantar foot 37.4  10 cm proximal to floor at lateral plantar foot 29.4  Circumference of ankle/heel   5 cm proximal to 1st MTP joint 24.5  Across MTP joint 23.4  Around proximal great toe 8.2  (Blank rows = not tested)    GAIT: Distance walked: to room and back to front office after rx Assistive device utilized: Environmental consultant - 4 wheeled Level of assistance: Modified independence Comments: flexed posture, decreased heel strike toe o    TODAY'S TREATMENT  08/06/2022 Showed pt velcro wraps to see if she thinks she can get them on. They were not large enough for her to anchor but she seemed to think she could. Answered pt  questions about transportation and caregiver and gave her contact numbers for Medicaid. Instructed pts son in compression bandaging. Therapist applied TG soft, Toe wrap toes 1-4, Artiflex ankle to knee,8 cm wrap to foot and ankle with figure 8, 10 cm wrap for 2 heel locks and then up leg, and then 10 cm wrap mid lef to knee with TG soft pulled over it. Pts son practiced 1st 2 wraps for foot and ankle and heel locks and did quite well .These were left on and therapist did 3rd leg wrap to knee. Pt was shown how to perform exercises to loosen wraps, and was advised to remove wraps if they slide or become too uncomfortable. She was also given written instructions for laundering, removing wraps, and video information for bandaging.   07/21/2022 Discussed treatment including compression bandaging, MLD, Remedial exercise, and different forms of compression garments. Pt might benefit from Velcro wraps instead of normal compression garments. Discussed # of visits per week and length of stay, need for different footwear ie cast shoes which pt has, and possibly having someone we can train to wrap so she can shower/bathe  PATIENT EDUCATION:  Education details: Treatment plan, types of compression, footwear, Person educated: Patient and Child(ren),son Chartered loss adjuster method: Explanation Education comprehension: verbalized understanding   HOME EXERCISE PROGRAM: NA  ASSESSMENT:  CLINICAL IMPRESSION: Pt with multiple questions which  were answered prior to treatment. Therapist performed wrapping initially with education to pts son. His first 2 wraps were left on and therapist completed third wrap. He did very well with only occasional VC's and assist. Pt verbalized understanding to remove wraps if they slid or become uncomfortable.   OBJECTIVE IMPAIRMENTS decreased activity tolerance, difficulty walking, and increased edema.   ACTIVITY LIMITATIONS carrying, standing, stairs, bed mobility, and locomotion  levelactivities requiring standing, stairs, extended walking  PARTICIPATION LIMITATIONS:  as above  PERSONAL FACTORS Age, Time since onset of injury/illness/exacerbation, and 1-2 comorbidities: Lymphoma, prioer DVT/PE  are also affecting patient's functional outcome.   REHAB POTENTIAL: Excellent  CLINICAL DECISION MAKING: Stable/uncomplicated  EVALUATION COMPLEXITY: Low  GOALS: Goals reviewed with patient? Yes  SHORT TERM GOALS: Target date: 08/27/2022    Pt will have decreased edema at 10 cm prox to floor by atleast 2.5 cm Baseline LEFT 29.4, RIGHT 34.3 Goal status: INITIAL  2.  Pt will have decreased edema at 30 cm prox to floor by 3 cm Baseline: Left 54.6, Right 48.5 Goal status: INITIAL  3.  Pts. Family member will be independent in compression bandaging so pt may bathe at home. Baseline: dependent Goal status: INITIAL  LONG TERM GOALS: Target date: 09/17/2022    Pt will have decreased edema at 10 cm prox to floor by 4 cm Baseline: left 37.4, Right 34/3 Goal status: INITIAL  2.  Pt will have edema reduction at 30 cm prox to floor by 5 cm Baseline: Left 54.6, right 48.5 Goal status: INITIAL  3.  Pt will be fit with appropriate compression garments;likely velcro wrap, and will be independent in their use Baseline:  Goal status: INITIAL  4.  Pt will have flexitouch demo to determine her desire for having the pump to assist with swelling Baseline:  Goal status: INITIAL   PLAN: PT FREQUENCY: 3x/week  PT DURATION: 6 weeks  PLANNED INTERVENTIONS: Therapeutic exercises, Patient/Family education, Self Care, Orthotic/Fit training, Manual lymph drainage, Compression bandaging, Vasopneumatic device, Manual therapy, and Re-evaluation  PLAN FOR NEXT SESSION: share info on Flexi and see if she wants demographics sent, initiate MLD and wrapping, My want to just wrap right leg first to be sure she tolerates OK and then next visit to bilateral LE's toes -Knees as long as she  can get around OK. Pt asked if she could just do 1 leg until it is done and then the other. Will check in with her next time.   Claris Pong, PT 08/06/2022, 12:50 PM

## 2022-08-06 NOTE — Patient Instructions (Signed)
Self bandaging the leg:   Follow along with this video:  [email or web address]  -OR-  Search in your internet browser: "self bandaging leg MD [place]"   And the video should appear in the video search results "Lymphedema Management: Self bandaging your leg" on YouTube   This video is for caregiver bandaging:  [email or web address]  -OR-  Search "Lymphedema management: Caregiver bandaging your leg" in your search browser and the video should appear in the search results [place]. Should your bandages become uncomfortable or feel too tight, follow these steps: Elevate your extremity higher than your heart.  Try to move your arm or leg joints against the firmness of the bandage to help with moving the fluid and allow the bandages to loosen a bit.  If the bandaging is still is too tight, it is ok to carefully remove the top layer.  There will still be more layers under it that can provide compression to your extremity. Finally, if you STILL have significant pain after trying these steps, it is ok to take the bandage off.  Check your skin carefully for any signs of irritation  PLEASE bring ALL bandage materials back to your next appointment as we will reuse what we can TAKE CARE OF YOUR BANDAGES SO THEY WILL LAST LONGER AND STAY IN BETTER CONDITION Washing bandages:  Wash periodically using a mild detergent in warm water.  Do not use fabric softener or bleach.  Place bandages in a mesh lingerie bag or in a tied off pillow case and use the gentle cycle of the washing machine or hand wash. If you hand wash, you may want to put them in the spin cycle of your washer to get the extra water out, but make sure you put them in a mesh bag first. Do not wring or stretch them while they are wet.  Drying bandages: Lay the bandages out smoothly on a towel away from direct sunlight or  heating sources that can damage the fabric. Rolling bandages in a towel and gently squeezing the towel to remove excess water before laying them out can speed up the process.  If you use a drying rack, place a towel on top of the rack to lay the bandages on.  If they hang down to dry, they fabric could be stretched out and the bandage will lose its compression.   Or, keep bandages in the mesh bag and dry them in the dryer on the low or no heat cycle. Rolling bandages: Please roll your bandages after drying them so they are ready for your next treatment. If they are rolled too loose, they will be difficult to apply.  If rolled too tight, they can get stretched out.   TAKE CARE OF YOUR SKIN Apply a low pH moisturizing lotion to your skin daily Avoid scratching your skin Treat skin irritations quickly  Know the 5 warning signs of infection: redness, pain, warmth to touch, fever and increased swelling.  Call your physician immediately if you notice any of these signs of a possible infection.

## 2022-08-08 ENCOUNTER — Ambulatory Visit: Payer: Medicare Other

## 2022-08-08 DIAGNOSIS — C8330 Diffuse large B-cell lymphoma, unspecified site: Secondary | ICD-10-CM

## 2022-08-08 DIAGNOSIS — I890 Lymphedema, not elsewhere classified: Secondary | ICD-10-CM

## 2022-08-08 NOTE — Therapy (Signed)
OUTPATIENT PHYSICAL THERAPY ONCOLOGY TREATMENT  Patient Name: Lauren Santana MRN: [id number] DOB:[birth year], 71 y.o., female Today's Date: 08/08/2022   PT End of Session - 08/08/22 1058     Visit Number 3    Number of Visits 18    Date for PT Re-Evaluation 09/01/22    PT Start Time 1100    PT Stop Time 1205    PT Time Calculation (min) 65 min    Activity Tolerance Patient tolerated treatment well    Behavior During Therapy [facility] for tasks assessed/performed             Past Medical History:  Diagnosis Date   Arthritis    Edema    B/LLE   Endometriosis    High cholesterol    Hypertension    Numbness    feet   Pre-diabetes    Pulmonary embolism ([place])    recurrent, second episode in 06/2020   Walker as ambulation aid    Wears dentures    Wears glasses    Past Surgical History:  Procedure Laterality Date   ABDOMINAL HYSTERECTOMY     and BSO   CATARACT EXTRACTION Left 10/2018   CESAREAN SECTION  1969, 1971   COLONOSCOPY     EYE SURGERY Bilateral    CATARACT SX   HERNIA REPAIR  2001   incisional   IR CATHETER TUBE CHANGE  10/12/2020   IR CATHETER TUBE CHANGE  12/07/2020   IR CHOLANGIOGRAM EXISTING TUBE  09/28/2020   IR PERC CHOLECYSTOSTOMY  08/17/2020   IR RADIOLOGIST EVAL & MGMT  11/22/2020   JOINT REPLACEMENT Left    Knee   MAXILLARY ANTROSTOMY Right 01/20/2020   Procedure: MAXILLARY ANTROSTOMY  WITH BIOPSY CALDWELL APPROACH;  Surgeon: Jerrell Belfast, MD;  Location: [place];  Service: ENT;  Laterality: Right;   MULTIPLE TOOTH EXTRACTIONS  2011   NASAL ENDOSCOPY Right 02/17/2020   Procedure: NASAL ENDOSCOPY WITH RIGHT INFERIOR TURNBINATE REDUCTION;  Surgeon: Jerrell Belfast, MD;  Location: [facility] OR;  Service: ENT;  Laterality: Right;   neck mass removal     "fatty tissue, not cancerous" per pt's son   SINUS ENDO WITH FUSION Right 01/20/2020   Procedure: SINUS ENDO WITH FUSION WITH BIOSPY;  Surgeon: Jerrell Belfast, MD;  Location: [place];  Service: ENT;   Laterality: Right;   TOTAL KNEE ARTHROPLASTY Left 07/17/2014   Procedure: TOTAL KNEE ARTHROPLASTY;  Surgeon: Kerin Salen, MD;  Location: [place];  Service: Orthopedics;  Laterality: Left;   Patient Active Problem List   Diagnosis Date Noted   Anticoagulated 12/11/2020   Cholecystitis, acute 08/16/2020   Abdominal pain 08/16/2020   Severe sepsis ([place]) 08/16/2020   Pressure injury of skin 08/16/2020   Atrial fibrillation ([place]) 07/24/2020   Thrombocytopenia ([place]) 07/24/2020   Anemia of chronic disease 07/24/2020   Chronic pain 07/24/2020   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   Pulmonary embolism ([place]) 07/19/2020   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   S/P BSO (bilateral salpingo-oophorectomy) 01/08/2017   S/P hysterectomy 01/08/2017   Tachycardia    Acute deep vein thrombosis (DVT) of right lower extremity ([place]) 02/14/2016   Pulmonary emboli ([place]) 02/13/2016   PE (pulmonary embolism) 02/13/2016   AKI (acute kidney injury) ([place]) 02/13/2016   Pulmonary embolism with acute cor pulmonale (H[facility])    Pelvic mass in female    Obesity (BMI 30-39.9) 08/20/2015   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   Essential hypertension, benign 07/23/2014  Edema 07/23/2014   Dyslipidemia 07/23/2014   Lower extremity numbness 07/23/2014   Arthritis of knee 07/17/2014   Postmenopausal bleeding 03/10/2012   Endometriosis of pelvis 01/22/2007    PCP: Beatrix Shipper, MD  REFERRING PROVIDER: [name], MD  REFERRING DIAG: Diffuse large B cell Lymphoma  THERAPY DIAG:  Lymphoma, large-cell, diffuse ([place])  Lymphedema  ONSET DATE: 2 years ago  Rationale for Evaluation and Treatment Rehabilitation  SUBJECTIVE                                                                                                                                                                                           SUBJECTIVE STATEMENT: I was really surprised. The wraps were  actually comfortable. Can we measure today? I made a shoe for my right foot.   PERTINENT HISTORY:  The patient is a 71 year old woman with a history of DLBCL.She has completed 6 cycles of R-mini CHOP with cycle 6-day 1 being 10/11/2020.  She has chronic Nausea/vomiting but she takes some liquid Sulcrafate and this has helped.  At her office visit  in April with MD she had no clinical signs consistent with disease reoccurence.  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  She has taken furosemide for 2 years with no improvement. She has compression stockings but she has trouble getting them on and does not wear them. She is here with her son Lauren Santana.(Friend of Dr. Ron Agee)  PAIN:  Are you having pain? Yesfrom neuropathy NPRS scale: unable to rate/10 Pain location: bilateral feet. Pain orientation: Bilateral  PAIN TYPE: nervy Pain description: constant  Aggravating factors: nothing Relieving factors: nothing  PRECAUTIONS: Other: B cell lymphoma, DM  WEIGHT BEARING RESTRICTIONS No  FALLS:  Has patient fallen in last 6 months? no  LIVING ENVIRONMENT: Lives with: Lives Alone Lives in: House/apartment Stairs: No;  Has following equipment at home: Environmental consultant - 4 wheeled, Wheelchair (manual), shower chair, Shower bench, and bed side commode  OCCUPATION: retired Aeronautical engineer  LEISURE: reading, prayer, encourager to family and friends  HAND DOMINANCE : right   PRIOR LEVEL OF FUNCTION: Independent with household mobility with device, even does her own sweeping and mopping  PATIENT GOALS Decrease leg swelling   OBJECTIVE  COGNITION:  Overall cognitive status: Within functional limits for tasks assessed   PALPATION: Fibrosis noted especially right calf region, mild pitting without tenderness at bilateral lower legs  OBSERVATIONS / OTHER ASSESSMENTS: Right leg with increased swelling greater than left, ankles  very swollen above her slipper but reduced at foot.  SENSATION:  Light touch: Appears intact    POSTURE: forward head, rounded shoulders    UPPER EXTREMITY STRENGTH: NT   LOWER EXTREMITY MMT: NT  LYMPHEDEMA ASSESSMENTS:   SURGERY TYPE/DATE: NA  NUMBER OF LYMPH NODES REMOVED: NA  CHEMOTHERAPY: Yes  RADIATION:no  HORMONE TREATMENT: no  INFECTIONS: NA  LYMPHEDEMA ASSESSMENTS:     LOWER EXTREMITY LANDMARK RIGHT eval 08/08/2022  At groin    30 cm proximal to suprapatella    20 cm proximal to suprapatella    10 cm proximal to suprapatella    At midpatella / popliteal crease 47.3   30 cm proximal to floor at lateral plantar foot 54.6 49.5  20 cm proximal to floor at lateral plantar foot 39.9 37.1  10 cm proximal to floor at lateral plantar foot 34.3 27.7  Circumference of ankle/heel    5 cm proximal to 1st MTP joint 27.0   Across MTP joint 24.3   Around proximal great toe 9.4   (Blank rows = not tested)  LOWER EXTREMITY LANDMARK LEFT eval  At groin   30 cm proximal to suprapatella   20 cm proximal to suprapatella   10 cm proximal to suprapatella   At midpatella / popliteal crease 43.4  30 cm proximal to floor at lateral plantar foot 48.5  20 cm proximal to floor at lateral plantar foot 37.4  10 cm proximal to floor at lateral plantar foot 29.4  Circumference of ankle/heel   5 cm proximal to 1st MTP joint 24.5  Across MTP joint 23.4  Around proximal great toe 8.2  (Blank rows = not tested)    GAIT: Distance walked: to room and back to front office after rx Assistive device utilized: Environmental consultant - 4 wheeled Level of assistance: Modified independence Comments: flexed posture, decreased heel strike toe o    TODAY'S TREATMENT  08/08/2022 Pts wraps removed and leg washed. Applied cocoa butter to right leg. Looked at her shoe that she had cut the front from but it has a velcro strap and it did fit over her wraps so she work well for her. Performed MLD to Right  LE, supraclavicular, 5 breaths, right axillary and inguinal LN's, right lateral leg, medial to lateral, and lateral leg retracing pathways, then lower leg retracing all steps and then foot and ankle retracing all steps and ending with LN's. Pt was wrapped by therapist:Therapist applied TG soft, Toe wrap toes 1-4, Artiflex ankle to knee,8 cm wrap to foot and ankle with figure 8, 10 cm wrap for 2 heel locks and then up leg, and then 10 cm wrap mid le to knee with TG soft pulled over it. Reminded pt how to exercise leg to loosen wraps and to remove if it gets uncomfortable. I showed pt the Flexitouch and she is not interested in having her benefits checked.     08/06/2022 Showed pt velcro wraps to see if she thinks she can get them on. They were not large enough for her to anchor but she seemed to think she could. Answered pt questions about transportation and caregiver and gave her contact numbers for Medicaid. Instructed pts son in compression bandaging. Therapist applied TG soft, Toe wrap toes 1-4, Artiflex ankle to knee,8 cm wrap to foot and ankle with figure 8, 10 cm wrap for 2 heel locks and then up leg, and then 10 cm wrap mid lef to knee with TG soft pulled over it. Pts son practiced 1st 2 wraps for foot and ankle and heel  locks and did quite well .These were left on and therapist did 3rd leg wrap to knee. Pt was shown how to perform exercises to loosen wraps, and was advised to remove wraps if they slide or become too uncomfortable. She was also given written instructions for laundering, removing wraps, and video information for bandaging.   07/21/2022 Discussed treatment including compression bandaging, MLD, Remedial exercise, and different forms of compression garments. Pt might benefit from Velcro wraps instead of normal compression garments. Discussed # of visits per week and length of stay, need for different footwear ie cast shoes which pt has, and possibly having someone we can train to wrap  so she can shower/bathe  PATIENT EDUCATION:  Education details: Treatment plan, types of compression, footwear, Person educated: Patient and Child(ren),son Chartered loss adjuster method: Explanation Education comprehension: verbalized understanding   HOME EXERCISE PROGRAM: NA  ASSESSMENT:  CLINICAL IMPRESSION: Pt had excellent reduction in her right leg after just 2 days in the wraps. Pt was comfortable in the wraps and has a good attitude about doing her best.Pt was not at all interested in the [place] even though her insurance would likely cover.   OBJECTIVE IMPAIRMENTS decreased activity tolerance, difficulty walking, and increased edema.   ACTIVITY LIMITATIONS carrying, standing, stairs, bed mobility, and locomotion levelactivities requiring standing, stairs, extended walking  PARTICIPATION LIMITATIONS:  as above  PERSONAL FACTORS Age, Time since onset of injury/illness/exacerbation, and 1-2 comorbidities: Lymphoma, prioer DVT/PE  are also affecting patient's functional outcome.   REHAB POTENTIAL: Excellent  CLINICAL DECISION MAKING: Stable/uncomplicated  EVALUATION COMPLEXITY: Low  GOALS: Goals reviewed with patient? Yes  SHORT TERM GOALS: Target date: 08/29/2022    Pt will have decreased edema at 10 cm prox to floor by atleast 2.5 cm Baseline LEFT 29.4, RIGHT 34.3 Goal status:right 08/08/2022  2.  Pt will have decreased edema at 30 cm prox to floor by 3 cm Baseline: Left 54.6, Right 48.5 Goal status: INITIAL  3.  Pts. Family member will be independent in compression bandaging so pt may bathe at home. Baseline: dependent Goal status: INITIAL  LONG TERM GOALS: Target date: 09/19/2022    Pt will have decreased edema at 10 cm prox to floor by 4 cm Baseline: left 37.4, Right 34/3 Goal status: INITIAL  2.  Pt will have edema reduction at 30 cm prox to floor by 5 cm Baseline: Left 54.6, right 48.5 Goal status: INITIAL  3.  Pt will be fit with appropriate  compression garments;likely velcro wrap, and will be independent in their use Baseline:  Goal status: INITIAL  4.  Pt will have flexitouch demo to determine her desire for having the pump to assist with swelling Baseline:  Goal status: INITIAL   PLAN: PT FREQUENCY: 3x/week  PT DURATION: 6 weeks  PLANNED INTERVENTIONS: Therapeutic exercises, Patient/Family education, Self Care, Orthotic/Fit training, Manual lymph drainage, Compression bandaging, Vasopneumatic device, Manual therapy, and Re-evaluation  PLAN FOR NEXT SESSION: Pt NOT interested in Flexitouch, initiate MLD and wrapping, May want to just wrap right leg first to be sure she tolerates OK and then next visit to bilateral LE's toes -Knees as long as she can get around OK. Pt asked if she could just do 1 leg until it is done and then the other. Will check in with her next time.   Claris Pong, PT 08/08/2022, 12:19 PM

## 2022-08-11 ENCOUNTER — Ambulatory Visit: Payer: Medicare Other

## 2022-08-11 DIAGNOSIS — I890 Lymphedema, not elsewhere classified: Secondary | ICD-10-CM | POA: Diagnosis not present

## 2022-08-11 DIAGNOSIS — C8330 Diffuse large B-cell lymphoma, unspecified site: Secondary | ICD-10-CM | POA: Diagnosis not present

## 2022-08-11 NOTE — Therapy (Signed)
OUTPATIENT PHYSICAL THERAPY ONCOLOGY TREATMENT  Patient Name: Lauren Santana MRN: [id number] DOB:13-Oct-1951, 72 y.o., female Today's Date: 08/11/2022   PT End of Session - 08/11/22 1203     Visit Number 4    Number of Visits 18    Date for PT Re-Evaluation 09/01/22    PT Start Time 1204    PT Stop Time 1253    PT Time Calculation (min) 49 min    Activity Tolerance Patient tolerated treatment well    Behavior During Therapy [facility] for tasks assessed/performed             Past Medical History:  Diagnosis Date   Arthritis    Edema    B/LLE   Endometriosis    High cholesterol    Hypertension    Numbness    feet   Pre-diabetes    Pulmonary embolism (HCC)    recurrent, second episode in 06/2020   Walker as ambulation aid    Wears dentures    Wears glasses    Past Surgical History:  Procedure Laterality Date   ABDOMINAL HYSTERECTOMY     and BSO   CATARACT EXTRACTION Left 10/2018   CESAREAN SECTION  1969, 1971   COLONOSCOPY     EYE SURGERY Bilateral    CATARACT SX   HERNIA REPAIR  2001   incisional   IR CATHETER TUBE CHANGE  10/12/2020   IR CATHETER TUBE CHANGE  12/07/2020   IR CHOLANGIOGRAM EXISTING TUBE  09/28/2020   IR PERC CHOLECYSTOSTOMY  08/17/2020   IR RADIOLOGIST EVAL & MGMT  11/22/2020   JOINT REPLACEMENT Left    Knee   MAXILLARY ANTROSTOMY Right 01/20/2020   Procedure: MAXILLARY ANTROSTOMY  WITH BIOPSY CALDWELL APPROACH;  Surgeon: Jerrell Belfast, MD;  Location: [place];  Service: ENT;  Laterality: Right;   MULTIPLE TOOTH EXTRACTIONS  2011   NASAL ENDOSCOPY Right 02/17/2020   Procedure: NASAL ENDOSCOPY WITH RIGHT INFERIOR TURNBINATE REDUCTION;  Surgeon: Jerrell Belfast, MD;  Location: [facility] OR;  Service: ENT;  Laterality: Right;   neck mass removal     "fatty tissue, not cancerous" per pt's son   SINUS ENDO WITH FUSION Right 01/20/2020   Procedure: SINUS ENDO WITH FUSION WITH BIOSPY;  Surgeon: Jerrell Belfast, MD;  Location: [place];  Service: ENT;   Laterality: Right;   TOTAL KNEE ARTHROPLASTY Left 07/17/2014   Procedure: TOTAL KNEE ARTHROPLASTY;  Surgeon: Kerin Salen, MD;  Location: [place];  Service: Orthopedics;  Laterality: Left;   Patient Active Problem List   Diagnosis Date Noted   Anticoagulated 12/11/2020   Cholecystitis, acute 08/16/2020   Abdominal pain 08/16/2020   Severe sepsis ([place]) 08/16/2020   Pressure injury of skin 08/16/2020   Atrial fibrillation ([place]) 07/24/2020   Thrombocytopenia ([place]) 07/24/2020   Anemia of chronic disease 07/24/2020   Chronic pain 07/24/2020   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[place]) 07/24/2020   Pulmonary embolism ([place]) 07/19/2020   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   S/P BSO (bilateral salpingo-oophorectomy) 01/08/2017   S/P hysterectomy 01/08/2017   Tachycardia    Acute deep vein thrombosis (DVT) of right lower extremity ([place]) 02/14/2016   Pulmonary emboli ([place]) 02/13/2016   PE (pulmonary embolism) 02/13/2016   AKI (acute kidney injury) ([place]) 02/13/2016   Pulmonary embolism with acute cor pulmonale (HCC)    Pelvic mass in female    Obesity (BMI 30-39.9) 08/20/2015   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   Essential hypertension, benign 07/23/2014  Edema 07/23/2014   Dyslipidemia 07/23/2014   Lower extremity numbness 07/23/2014   Arthritis of knee 07/17/2014   Postmenopausal bleeding 03/10/2012   Endometriosis of pelvis 01/22/2007    PCP: Beatrix Shipper, MD  REFERRING PROVIDER: [name], MD  REFERRING DIAG: Diffuse large B cell Lymphoma  THERAPY DIAG:  Lymphoma, large-cell, diffuse ([address])  Lymphedema  ONSET DATE: 2 years ago  Rationale for Evaluation and Treatment Rehabilitation  SUBJECTIVE                                                                                                                                                                                           SUBJECTIVE STATEMENT: I am doing good. I had a little bit of  pain in the heel area, but not bad.   PERTINENT HISTORY:  The patient is a 71 year old woman with a history of DLBCL.She has completed 6 cycles of R-mini CHOP with cycle 6-day 1 being 10/11/2020.  She has chronic Nausea/vomiting but she takes some liquid Sulcrafate and this has helped.  At her office visit  in April with MD she had no clinical signs consistent with disease reoccurence.  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  She has taken furosemide for 2 years with no improvement. She has compression stockings but she has trouble getting them on and does not wear them. She is here with her son [name].(Friend of Dr. Ron Agee)  PAIN:  Are you having pain? Yesfrom neuropathy NPRS scale: unable to rate/10 Pain location: bilateral feet. Pain orientation: Bilateral  PAIN TYPE: nervy Pain description: constant  Aggravating factors: nothing Relieving factors: nothing  PRECAUTIONS: Other: B cell lymphoma, DM  WEIGHT BEARING RESTRICTIONS No  FALLS:  Has patient fallen in last 6 months? no  LIVING ENVIRONMENT: Lives with: Lives Alone Lives in: House/apartment Stairs: No;  Has following equipment at home: Environmental consultant - 4 wheeled, Wheelchair (manual), shower chair, Shower bench, and bed side commode  OCCUPATION: retired Aeronautical engineer  LEISURE: reading, prayer, encourager to family and friends  HAND DOMINANCE : right   PRIOR LEVEL OF FUNCTION: Independent with household mobility with device, even does her own sweeping and mopping  PATIENT GOALS Decrease leg swelling   OBJECTIVE  COGNITION:  Overall cognitive status: Within functional limits for tasks assessed   PALPATION: Fibrosis noted especially right calf region, mild pitting without tenderness at bilateral lower legs  OBSERVATIONS / OTHER ASSESSMENTS: Right leg with increased swelling greater than left, ankles very swollen above her slipper but reduced  at foot.  SENSATION:  Light  touch: Appears intact    POSTURE: forward head, rounded shoulders    UPPER EXTREMITY STRENGTH: NT   LOWER EXTREMITY MMT: NT  LYMPHEDEMA ASSESSMENTS:   SURGERY TYPE/DATE: NA  NUMBER OF LYMPH NODES REMOVED: NA  CHEMOTHERAPY: Yes  RADIATION:no  HORMONE TREATMENT: no  INFECTIONS: NA  LYMPHEDEMA ASSESSMENTS:     LOWER EXTREMITY LANDMARK RIGHT eval 08/08/2022  At groin    30 cm proximal to suprapatella    20 cm proximal to suprapatella    10 cm proximal to suprapatella    At midpatella / popliteal crease 47.3   30 cm proximal to floor at lateral plantar foot 54.6 49.5  20 cm proximal to floor at lateral plantar foot 39.9 37.1  10 cm proximal to floor at lateral plantar foot 34.3 27.7  Circumference of ankle/heel    5 cm proximal to 1st MTP joint 27.0   Across MTP joint 24.3   Around proximal great toe 9.4   (Blank rows = not tested)  LOWER EXTREMITY LANDMARK LEFT eval  At groin   30 cm proximal to suprapatella   20 cm proximal to suprapatella   10 cm proximal to suprapatella   At midpatella / popliteal crease 43.4  30 cm proximal to floor at lateral plantar foot 48.5  20 cm proximal to floor at lateral plantar foot 37.4  10 cm proximal to floor at lateral plantar foot 29.4  Circumference of ankle/heel   5 cm proximal to 1st MTP joint 24.5  Across MTP joint 23.4  Around proximal great toe 8.2  (Blank rows = not tested)    GAIT: Distance walked: to room and back to front office after rx Assistive device utilized: Environmental consultant - 4 wheeled Level of assistance: Modified independence Comments: flexed posture, decreased heel strike toe o    TODAY'S TREATMENT  08/11/2022 Pts wraps removed and leg washed. Applied cocoa butter to right leg. Marland Kitchen Performed MLD to Right LE, supraclavicular, 5 breaths, right axillary and inguinal LN's, right lateral leg, medial to lateral, and lateral leg retracing pathways, then lower leg retracing all  steps and then foot and ankle retracing all steps and ending with LN's. Pt was wrapped by therapist:Therapist applied TG soft, Toe wrap toes 1-5 with 2 in elastomull,  Artiflex foot to knee,8 cm wrap to foot and ankle with figure 8, 10 cm wrap for 2 heel locks and then up leg, and then 10 cm wrap mid le to knee with TG soft pulled over it. Used new wraps today and pt will wash wraps that were removed today. Pt gave approval to send demographics to [facility]   08/08/2022 Pts wraps removed and leg washed. Applied cocoa butter to right leg. Looked at her shoe that she had cut the front from but it has a velcro strap and it did fit over her wraps so she work well for her. Performed MLD to Right LE, supraclavicular, 5 breaths, right axillary and inguinal LN's, right lateral leg, medial to lateral, and lateral leg retracing pathways, then lower leg retracing all steps and then foot and ankle retracing all steps and ending with LN's. Pt was wrapped by therapist:Therapist applied TG soft, Toe wrap toes 1-4, Artiflex ankle to knee,8 cm wrap to foot and ankle with figure 8, 10 cm wrap for 2 heel locks and then up leg, and then 10 cm wrap mid le to knee with TG soft pulled over it. Reminded pt how to exercise leg to loosen wraps and to remove if  it gets uncomfortable. I showed pt the Flexitouch and she is not interested in having her benefits checked.     08/06/2022 Showed pt velcro wraps to see if she thinks she can get them on. They were not large enough for her to anchor but she seemed to think she could. Answered pt questions about transportation and caregiver and gave her contact numbers for Medicaid. Instructed pts son in compression bandaging. Therapist applied TG soft, Toe wrap toes 1-4, Artiflex ankle to knee,8 cm wrap to foot and ankle with figure 8, 10 cm wrap for 2 heel locks and then up leg, and then 10 cm wrap mid lef to knee with TG soft pulled over it. Pts son practiced 1st 2 wraps for foot and ankle  and heel locks and did quite well .These were left on and therapist did 3rd leg wrap to knee. Pt was shown how to perform exercises to loosen wraps, and was advised to remove wraps if they slide or become too uncomfortable. She was also given written instructions for laundering, removing wraps, and video information for bandaging.   07/21/2022 Discussed treatment including compression bandaging, MLD, Remedial exercise, and different forms of compression garments. Pt might benefit from Velcro wraps instead of normal compression garments. Discussed # of visits per week and length of stay, need for different footwear ie cast shoes which pt has, and possibly having someone we can train to wrap so she can shower/bathe  PATIENT EDUCATION:  Education details: Treatment plan, types of compression, footwear, Person educated: Patient and Child(ren),son Chartered loss adjuster method: Explanation Education comprehension: verbalized understanding   HOME EXERCISE PROGRAM: NA  ASSESSMENT:  CLINICAL IMPRESSION: Pt does not wish to wrap left LE yet. Right leg doing well, howevr, both legs have some edema just above the knee that is slightly firm. Wrapping her leg above the knee would not stay up. May consider velcro wrap for above knee.  OBJECTIVE IMPAIRMENTS decreased activity tolerance, difficulty walking, and increased edema.   ACTIVITY LIMITATIONS carrying, standing, stairs, bed mobility, and locomotion levelactivities requiring standing, stairs, extended walking  PARTICIPATION LIMITATIONS:  as above  PERSONAL FACTORS Age, Time since onset of injury/illness/exacerbation, and 1-2 comorbidities: Lymphoma, prioer DVT/PE  are also affecting patient's functional outcome.   REHAB POTENTIAL: Excellent  CLINICAL DECISION MAKING: Stable/uncomplicated  EVALUATION COMPLEXITY: Low  GOALS: Goals reviewed with patient? Yes  SHORT TERM GOALS: Target date: 09/01/2022    Pt will have decreased edema at 10 cm prox  to floor by atleast 2.5 cm Baseline LEFT 29.4, RIGHT 34.3 Goal status:right 08/08/2022  2.  Pt will have decreased edema at 30 cm prox to floor by 3 cm Baseline: Left 54.6, Right 48.5 Goal status: INITIAL  3.  Pts. Family member will be independent in compression bandaging so pt may bathe at home. Baseline: dependent Goal status: INITIAL  LONG TERM GOALS: Target date: 09/22/2022    Pt will have decreased edema at 10 cm prox to floor by 4 cm Baseline: left 37.4, Right 34/3 Goal status: INITIAL  2.  Pt will have edema reduction at 30 cm prox to floor by 5 cm Baseline: Left 54.6, right 48.5 Goal status: INITIAL  3.  Pt will be fit with appropriate compression garments;likely velcro wrap, and will be independent in their use Baseline:  Goal status: INITIAL  4.  Pt will have flexitouch demo to determine her desire for having the pump to assist with swelling Baseline:  Goal status: INITIAL   PLAN: PT FREQUENCY:  3x/week  PT DURATION: 6 weeks  PLANNED INTERVENTIONS: Therapeutic exercises, Patient/Family education, Self Care, Orthotic/Fit training, Manual lymph drainage, Compression bandaging, Vasopneumatic device, Manual therapy, and Re-evaluation  PLAN FOR NEXT SESSION: Pt NOT interested in Flexitouch, continue MLD and wrapping, May want to just wrap right leg first to be sure she tolerates OK and then next visit to bilateral LE's toes -Knees as long as she can get around OK. Pt requests to  just do 1 leg until it is done and then the other.   Claris Pong, PT 08/11/2022, 1:01 PM

## 2022-08-13 ENCOUNTER — Ambulatory Visit: Payer: Medicare Other

## 2022-08-13 DIAGNOSIS — I890 Lymphedema, not elsewhere classified: Secondary | ICD-10-CM

## 2022-08-13 DIAGNOSIS — C8330 Diffuse large B-cell lymphoma, unspecified site: Secondary | ICD-10-CM | POA: Diagnosis not present

## 2022-08-13 NOTE — Therapy (Signed)
OUTPATIENT PHYSICAL THERAPY ONCOLOGY TREATMENT  Patient Name: Lauren Santana MRN: [id number] DOB:[birth year], 71 y.o., female Today's Date: 08/13/2022   PT End of Session - 08/13/22 1502     Visit Number 5    Number of Visits 18    Date for PT Re-Evaluation 09/01/22    PT Start Time 1505    PT Stop Time 1600    PT Time Calculation (min) 55 min    Activity Tolerance Patient tolerated treatment well    Behavior During Therapy [facility] for tasks assessed/performed             Past Medical History:  Diagnosis Date   Arthritis    Edema    B/LLE   Endometriosis    High cholesterol    Hypertension    Numbness    feet   Pre-diabetes    Pulmonary embolism (HCC)    recurrent, second episode in 06/2020   Walker as ambulation aid    Wears dentures    Wears glasses    Past Surgical History:  Procedure Laterality Date   ABDOMINAL HYSTERECTOMY     and BSO   CATARACT EXTRACTION Left 10/2018   CESAREAN SECTION  1969, 1971   COLONOSCOPY     EYE SURGERY Bilateral    CATARACT SX   HERNIA REPAIR  2001   incisional   IR CATHETER TUBE CHANGE  10/12/2020   IR CATHETER TUBE CHANGE  12/07/2020   IR CHOLANGIOGRAM EXISTING TUBE  09/28/2020   IR PERC CHOLECYSTOSTOMY  08/17/2020   IR RADIOLOGIST EVAL & MGMT  11/22/2020   JOINT REPLACEMENT Left    Knee   MAXILLARY ANTROSTOMY Right 01/20/2020   Procedure: MAXILLARY ANTROSTOMY  WITH BIOPSY CALDWELL APPROACH;  Surgeon: Lauren Belfast, MD;  Location: [place];  Service: ENT;  Laterality: Right;   MULTIPLE TOOTH EXTRACTIONS  2011   NASAL ENDOSCOPY Right 02/17/2020   Procedure: NASAL ENDOSCOPY WITH RIGHT INFERIOR TURNBINATE REDUCTION;  Surgeon: Lauren Belfast, MD;  Location: [facility];  Service: ENT;  Laterality: Right;   neck mass removal     "fatty tissue, not cancerous" per pt's son   SINUS ENDO WITH FUSION Right 01/20/2020   Procedure: SINUS ENDO WITH FUSION WITH BIOSPY;  Surgeon: Lauren Belfast, MD;  Location: [place];  Service: ENT;   Laterality: Right;   TOTAL KNEE ARTHROPLASTY Left 07/17/2014   Procedure: TOTAL KNEE ARTHROPLASTY;  Surgeon: Lauren Salen, MD;  Location: [place];  Service: Orthopedics;  Laterality: Left;   Patient Active Problem List   Diagnosis Date Noted   Anticoagulated 12/11/2020   Cholecystitis, acute 08/16/2020   Abdominal pain 08/16/2020   Severe sepsis ([place]) 08/16/2020   Pressure injury of skin 08/16/2020   Atrial fibrillation ([place]) 07/24/2020   Thrombocytopenia ([place]) 07/24/2020   Anemia of chronic disease 07/24/2020   Chronic pain 07/24/2020   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[place]) 07/24/2020   Pulmonary embolism ([place]) 07/19/2020   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   S/P BSO (bilateral salpingo-oophorectomy) 01/08/2017   S/P hysterectomy 01/08/2017   Tachycardia    Acute deep vein thrombosis (DVT) of right lower extremity ([place]) 02/14/2016   Pulmonary emboli ([place]) 02/13/2016   PE (pulmonary embolism) 02/13/2016   AKI (acute kidney injury) ([place]) 02/13/2016   Pulmonary embolism with acute cor pulmonale (HCC)    Pelvic mass in female    Obesity (BMI 30-39.9) 08/20/2015   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   Essential hypertension, benign 07/23/2014  Edema 07/23/2014   Dyslipidemia 07/23/2014   Lower extremity numbness 07/23/2014   Arthritis of knee 07/17/2014   Postmenopausal bleeding 03/10/2012   Endometriosis of pelvis 01/22/2007    PCP: Lauren Shipper, MD  REFERRING PROVIDER: [name], MD  REFERRING DIAG: Diffuse large B cell Lymphoma  THERAPY DIAG:  Lymphoma, large-cell, diffuse ([place])  Lymphedema  ONSET DATE: 2 years ago  Rationale for Evaluation and Treatment Rehabilitation  SUBJECTIVE                                                                                                                                                                                           SUBJECTIVE STATEMENT: The wrap felt good.   PERTINENT  HISTORY:  The patient is a 71 year old woman with a history of DLBCL.She has completed 6 cycles of R-mini CHOP with cycle 6-day 1 being 10/11/2020.  She has chronic Nausea/vomiting but she takes some liquid Sulcrafate and this has helped.  At her office visit  in April with MD she had no clinical signs consistent with disease reoccurence.  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  She has taken furosemide for 2 years with no improvement. She has compression stockings but she has trouble getting them on and does not wear them. She is here with her son Lauren Santana.(Friend of Lauren Santana)  PAIN:  Are you having pain? Yesfrom neuropathy NPRS scale: unable to rate/10 Pain location: bilateral feet. Pain orientation: Bilateral  PAIN TYPE: nervy Pain description: constant  Aggravating factors: nothing Relieving factors: nothing  PRECAUTIONS: Other: B cell lymphoma, DM  WEIGHT BEARING RESTRICTIONS No  FALLS:  Has patient fallen in last 6 months? no  LIVING ENVIRONMENT: Lives with: Lives Alone Lives in: House/apartment Stairs: No;  Has following equipment at home: Environmental consultant - 4 wheeled, Wheelchair (manual), shower chair, Shower bench, and bed side commode  OCCUPATION: retired Aeronautical engineer  LEISURE: reading, prayer, encourager to family and friends  HAND DOMINANCE : right   PRIOR LEVEL OF FUNCTION: Independent with household mobility with device, even does her own sweeping and mopping  PATIENT GOALS Decrease leg swelling   OBJECTIVE  COGNITION:  Overall cognitive status: Within functional limits for tasks assessed   PALPATION: Fibrosis noted especially right calf region, mild pitting without tenderness at bilateral lower legs  OBSERVATIONS / OTHER ASSESSMENTS: Right leg with increased swelling greater than left, ankles very swollen above her slipper but reduced at foot.  SENSATION:  Light touch: Appears  intact    POSTURE: forward head, rounded shoulders  UPPER EXTREMITY STRENGTH: NT   LOWER EXTREMITY MMT: NT  LYMPHEDEMA ASSESSMENTS:   SURGERY TYPE/DATE: NA  NUMBER OF LYMPH NODES REMOVED: NA  CHEMOTHERAPY: Yes  RADIATION:no  HORMONE TREATMENT: no  INFECTIONS: NA  LYMPHEDEMA ASSESSMENTS:     LOWER EXTREMITY LANDMARK RIGHT eval 08/08/2022 08/13/2022  At groin     30 cm proximal to suprapatella     20 cm proximal to suprapatella     10 cm proximal to suprapatella     At midpatella / popliteal crease 47.3    30 cm proximal to floor at lateral plantar foot 54.6 49.5 50  20 cm proximal to floor at lateral plantar foot 39.9 37.1 36.8  10 cm proximal to floor at lateral plantar foot 34.3 27.7 26.3  Circumference of ankle/heel     5 cm proximal to 1st MTP joint 27.0    Across MTP joint 24.3    Around proximal great toe 9.4    (Blank rows = not tested)  LOWER EXTREMITY LANDMARK LEFT eval  At groin   30 cm proximal to suprapatella   20 cm proximal to suprapatella   10 cm proximal to suprapatella   At midpatella / popliteal crease 43.4  30 cm proximal to floor at lateral plantar foot 48.5  20 cm proximal to floor at lateral plantar foot 37.4  10 cm proximal to floor at lateral plantar foot 29.4  Circumference of ankle/heel   5 cm proximal to 1st MTP joint 24.5  Across MTP joint 23.4  Around proximal great toe 8.2  (Blank rows = not tested)    GAIT: Distance walked: to room and back to front office after rx Assistive device utilized: Environmental consultant - 4 wheeled Level of assistance: Modified independence Comments: flexed posture, decreased heel strike toe o    TODAY'S TREATMENT  08/13/2022 Pts wraps removed and leg washed. Pt remeasured. Applied cocoa butter to right leg Performed MLD to Right LE, Supraclavicular, 5 Breaths, Right axillary and Inguinal LN's, right lateral leg, medial to lateral and lateral leg, retracing pathways then lower leg retracing steps, and  then foot and ankle retracing all steps and ending with LN's. Pt was wrapped by therapist applied TG soft, toe wrap 2 inelastomull toes 1-5, artiflex foot to knee, 8 cm wrap foot to ankle with figure 8/s, 10 cm wrap with 2 heel locks and then up leg., last 10 cm  lower calf to knee with TG soft pulled over it.   Due to swelling at Distal thigh decided to try large TG soft from below knee to groin folded at top as therapist does not feel a wrap here would stay   08/11/2022 Pts wraps removed and leg washed. Applied cocoa butter to right leg. Marland Kitchen Performed MLD to Right LE, supraclavicular, 5 breaths, right axillary and inguinal LN's, right lateral leg, medial to lateral, and lateral leg retracing pathways, then lower leg retracing all steps and then foot and ankle retracing all steps and ending with LN's. Pt was wrapped by therapist:Therapist applied TG soft, Toe wrap toes 1-5 with 2 in elastomull,  Artiflex foot to knee,8 cm wrap to foot and ankle with figure 8, 10 cm wrap for 2 heel locks and then up leg, and then 10 cm wrap mid le to knee with TG soft pulled over it. Used new wraps today and pt will wash wraps that were removed today. Pt gave approval to send demographics to [facility]   08/08/2022 Pts wraps removed and leg washed. Applied  cocoa butter to right leg. Looked at her shoe that she had cut the front from but it has a velcro strap and it did fit over her wraps so she work well for her. Performed MLD to Right LE, supraclavicular, 5 breaths, right axillary and inguinal LN's, right lateral leg, medial to lateral, and lateral leg retracing pathways, then lower leg retracing all steps and then foot and ankle retracing all steps and ending with LN's. Pt was wrapped by therapist:Therapist applied TG soft, Toe wrap toes 1-4, Artiflex ankle to knee,8 cm wrap to foot and ankle with figure 8, 10 cm wrap for 2 heel locks and then up leg, and then 10 cm wrap mid le to knee with TG soft pulled over it. Reminded pt  how to exercise leg to loosen wraps and to remove if it gets uncomfortable. I showed pt the Flexitouch and she is not interested in having her benefits checked.     08/06/2022 Showed pt velcro wraps to see if she thinks she can get them on. They were not large enough for her to anchor but she seemed to think she could. Answered pt questions about transportation and caregiver and gave her contact numbers for Medicaid. Instructed pts son in compression bandaging. Therapist applied TG soft, Toe wrap toes 1-4, Artiflex ankle to knee,8 cm wrap to foot and ankle with figure 8, 10 cm wrap for 2 heel locks and then up leg, and then 10 cm wrap mid lef to knee with TG soft pulled over it. Pts son practiced 1st 2 wraps for foot and ankle and heel locks and did quite well .These were left on and therapist did 3rd leg wrap to knee. Pt was shown how to perform exercises to loosen wraps, and was advised to remove wraps if they slide or become too uncomfortable. She was also given written instructions for laundering, removing wraps, and video information for bandaging.   07/21/2022 Discussed treatment including compression bandaging, MLD, Remedial exercise, and different forms of compression garments. Pt might benefit from Velcro wraps instead of normal compression garments. Discussed # of visits per week and length of stay, need for different footwear ie cast shoes which pt has, and possibly having someone we can train to wrap so she can shower/bathe  PATIENT EDUCATION:  Education details: Treatment plan, types of compression, footwear, Person educated: Patient and Child(ren),son Chartered loss adjuster method: Explanation Education comprehension: verbalized understanding   HOME EXERCISE PROGRAM: NA  ASSESSMENT:  CLINICAL IMPRESSION: Pt does not wish to wrap left LE yet. Continued MLD and wrap  to right LE .Right leg doing well, however, both legs have some edema just above the knee that is slightly firm. Tried  large TG soft from below Right  knee to upper thigh to try and improve swelling in that area. Pts insurance has no out of network benefits and therefor Medicaid will not pay for pt. Garments. Will contact [employer] to see if they can help.  OBJECTIVE IMPAIRMENTS decreased activity tolerance, difficulty walking, and increased edema.   ACTIVITY LIMITATIONS carrying, standing, stairs, bed mobility, and locomotion levelactivities requiring standing, stairs, extended walking  PARTICIPATION LIMITATIONS:  as above  PERSONAL FACTORS Age, Time since onset of injury/illness/exacerbation, and 1-2 comorbidities: Lymphoma, prioer DVT/PE  are also affecting patient's functional outcome.   REHAB POTENTIAL: Excellent  CLINICAL DECISION MAKING: Stable/uncomplicated  EVALUATION COMPLEXITY: Low  GOALS: Goals reviewed with patient? Yes  SHORT TERM GOALS: Target date: 09/03/2022    Pt will have decreased edema  at 10 cm prox to floor by atleast 2.5 cm Baseline LEFT 29.4, RIGHT 34.3 Goal status:right 08/08/2022  2.  Pt will have decreased edema at 30 cm prox to floor by 3 cm Baseline: Left 54.6, Right 48.5 Goal status: INITIAL  3.  Pts. Family member will be independent in compression bandaging so pt may bathe at home. Baseline: dependent Goal status: INITIAL  LONG TERM GOALS: Target date: 09/24/2022    Pt will have decreased edema at 10 cm prox to floor by 4 cm Baseline: left 37.4, Right 34/3 Goal status: INITIAL  2.  Pt will have edema reduction at 30 cm prox to floor by 5 cm Baseline: Left 54.6, right 48.5 Goal status: INITIAL  3.  Pt will be fit with appropriate compression garments;likely velcro wrap, and will be independent in their use Baseline:  Goal status: INITIAL  4.  Pt will have flexitouch demo to determine her desire for having the pump to assist with swelling Baseline:  Goal status: INITIAL   PLAN: PT FREQUENCY: 3x/week  PT DURATION: 6 weeks  PLANNED INTERVENTIONS:  Therapeutic exercises, Patient/Family education, Self Care, Orthotic/Fit training, Manual lymph drainage, Compression bandaging, Vasopneumatic device, Manual therapy, and Re-evaluation  PLAN FOR NEXT SESSION: Pt NOT interested in Flexitouch, continue MLD and wrapping, How was TG soft at thigh? wants to just wrap right leg first to be sure she tolerates OK .wants to  just do 1 leg until it is done and then the other.  Will email [employer] re;wraps and consider measuring for garments on Right Fri or Mon.   Claris Pong, PT 08/13/2022, 8:38 PM

## 2022-08-15 ENCOUNTER — Encounter: Payer: Self-pay | Admitting: Physical Therapy

## 2022-08-15 ENCOUNTER — Ambulatory Visit: Payer: Medicare Other | Admitting: Physical Therapy

## 2022-08-15 DIAGNOSIS — I890 Lymphedema, not elsewhere classified: Secondary | ICD-10-CM

## 2022-08-15 DIAGNOSIS — C8330 Diffuse large B-cell lymphoma, unspecified site: Secondary | ICD-10-CM | POA: Diagnosis not present

## 2022-08-15 NOTE — Therapy (Signed)
OUTPATIENT PHYSICAL THERAPY ONCOLOGY TREATMENT  Patient Name: Lauren Santana MRN: [id number] DOB:August 18, 1951, 71 y.o., female Today's Date: 08/15/2022   PT End of Session - 08/15/22 1200     Visit Number 6    Number of Visits 18    Date for PT Re-Evaluation 09/01/22    PT Start Time 1055    PT Stop Time 1157    PT Time Calculation (min) 62 min    Activity Tolerance Patient tolerated treatment well    Behavior During Therapy [facility] for tasks assessed/performed             Past Medical History:  Diagnosis Date   Arthritis    Edema    B/LLE   Endometriosis    High cholesterol    Hypertension    Numbness    feet   Pre-diabetes    Pulmonary embolism (HCC)    recurrent, second episode in 06/2020   Walker as ambulation aid    Wears dentures    Wears glasses    Past Surgical History:  Procedure Laterality Date   ABDOMINAL HYSTERECTOMY     and BSO   CATARACT EXTRACTION Left 10/2018   CESAREAN SECTION  1969, 1971   COLONOSCOPY     EYE SURGERY Bilateral    CATARACT SX   HERNIA REPAIR  2001   incisional   IR CATHETER TUBE CHANGE  10/12/2020   IR CATHETER TUBE CHANGE  12/07/2020   IR CHOLANGIOGRAM EXISTING TUBE  09/28/2020   IR PERC CHOLECYSTOSTOMY  08/17/2020   IR RADIOLOGIST EVAL & MGMT  11/22/2020   JOINT REPLACEMENT Left    Knee   MAXILLARY ANTROSTOMY Right 01/20/2020   Procedure: MAXILLARY ANTROSTOMY  WITH BIOPSY CALDWELL APPROACH;  Surgeon: Lauren Belfast, MD;  Location: [place];  Service: ENT;  Laterality: Right;   MULTIPLE TOOTH EXTRACTIONS  2011   NASAL ENDOSCOPY Right 02/17/2020   Procedure: NASAL ENDOSCOPY WITH RIGHT INFERIOR TURNBINATE REDUCTION;  Surgeon: Lauren Belfast, MD;  Location: [facility] OR;  Service: ENT;  Laterality: Right;   neck mass removal     "fatty tissue, not cancerous" per pt's son   SINUS ENDO WITH FUSION Right 01/20/2020   Procedure: SINUS ENDO WITH FUSION WITH BIOSPY;  Surgeon: Lauren Belfast, MD;  Location: [place];  Service: ENT;   Laterality: Right;   TOTAL KNEE ARTHROPLASTY Left 07/17/2014   Procedure: TOTAL KNEE ARTHROPLASTY;  Surgeon: Lauren Salen, MD;  Location: [place];  Service: Orthopedics;  Laterality: Left;   Patient Active Problem List   Diagnosis Date Noted   Anticoagulated 12/11/2020   Cholecystitis, acute 08/16/2020   Abdominal pain 08/16/2020   Severe sepsis ([place]) 08/16/2020   Pressure injury of skin 08/16/2020   Atrial fibrillation ([place]) 07/24/2020   Thrombocytopenia ([place]) 07/24/2020   Anemia of chronic disease 07/24/2020   Chronic pain 07/24/2020   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[place]) 07/24/2020   Pulmonary embolism ([place]) 07/19/2020   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   S/P BSO (bilateral salpingo-oophorectomy) 01/08/2017   S/P hysterectomy 01/08/2017   Tachycardia    Acute deep vein thrombosis (DVT) of right lower extremity ([place]) 02/14/2016   Pulmonary emboli ([place]) 02/13/2016   PE (pulmonary embolism) 02/13/2016   AKI (acute kidney injury) ([place]) 02/13/2016   Pulmonary embolism with acute cor pulmonale (HCC)    Pelvic mass in female    Obesity (BMI 30-39.9) 08/20/2015   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   Essential hypertension, benign 07/23/2014  Edema 07/23/2014   Dyslipidemia 07/23/2014   Lower extremity numbness 07/23/2014   Arthritis of knee 07/17/2014   Postmenopausal bleeding 03/10/2012   Endometriosis of pelvis 01/22/2007    PCP: Lauren Shipper, MD  REFERRING PROVIDER: [name], MD  REFERRING DIAG: Diffuse large B cell Lymphoma  THERAPY DIAG:  Lymphedema  Lymphoma, large-cell, diffuse ([place])  ONSET DATE: 2 years ago  Rationale for Evaluation and Treatment Rehabilitation  SUBJECTIVE                                                                                                                                                                                           SUBJECTIVE STATEMENT: My leg is going down.    PERTINENT  HISTORY:  The patient is a 71 year old woman with a history of DLBCL.She has completed 6 cycles of R-mini CHOP with cycle 6-day 1 being 10/11/2020.  She has chronic Nausea/vomiting but she takes some liquid Sulcrafate and this has helped.  At her office visit  in April with MD she had no clinical signs consistent with disease reoccurence.  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  She has taken furosemide for 2 years with no improvement. She has compression stockings but she has trouble getting them on and does not wear them. She is here with her son Lauren Santana.(Friend of Dr. Ron Santana)  PAIN:  Are you having pain? Yesfrom neuropathy NPRS scale: unable to rate/10 Pain location: bilateral feet. Pain orientation: Bilateral  PAIN TYPE: nervy Pain description: constant  Aggravating factors: nothing Relieving factors: nothing  PRECAUTIONS: Other: B cell lymphoma, DM  WEIGHT BEARING RESTRICTIONS No  FALLS:  Has patient fallen in last 6 months? no  LIVING ENVIRONMENT: Lives with: Lives Alone Lives in: House/apartment Stairs: No;  Has following equipment at home: Environmental consultant - 4 wheeled, Wheelchair (manual), shower chair, Shower bench, and bed side commode  OCCUPATION: retired Aeronautical engineer  LEISURE: reading, prayer, encourager to family and friends  HAND DOMINANCE : right   PRIOR LEVEL OF FUNCTION: Independent with household mobility with device, even does her own sweeping and mopping  PATIENT GOALS Decrease leg swelling   OBJECTIVE  COGNITION:  Overall cognitive status: Within functional limits for tasks assessed   PALPATION: Fibrosis noted especially right calf region, mild pitting without tenderness at bilateral lower legs  OBSERVATIONS / OTHER ASSESSMENTS: Right leg with increased swelling greater than left, ankles very swollen above her slipper but reduced at foot.  SENSATION:  Light touch: Appears  intact    POSTURE: forward head, rounded shoulders  UPPER EXTREMITY STRENGTH: NT   LOWER EXTREMITY MMT: NT  LYMPHEDEMA ASSESSMENTS:   SURGERY TYPE/DATE: NA  NUMBER OF LYMPH NODES REMOVED: NA  CHEMOTHERAPY: Yes  RADIATION:no  HORMONE TREATMENT: no  INFECTIONS: NA  LYMPHEDEMA ASSESSMENTS:     LOWER EXTREMITY LANDMARK RIGHT eval 08/08/2022 08/13/2022  At groin     30 cm proximal to suprapatella     20 cm proximal to suprapatella     10 cm proximal to suprapatella     At midpatella / popliteal crease 47.3    30 cm proximal to floor at lateral plantar foot 54.6 49.5 50  20 cm proximal to floor at lateral plantar foot 39.9 37.1 36.8  10 cm proximal to floor at lateral plantar foot 34.3 27.7 26.3  Circumference of ankle/heel     5 cm proximal to 1st MTP joint 27.0    Across MTP joint 24.3    Around proximal great toe 9.4    (Blank rows = not tested)  LOWER EXTREMITY LANDMARK LEFT eval  At groin   30 cm proximal to suprapatella   20 cm proximal to suprapatella   10 cm proximal to suprapatella   At midpatella / popliteal crease 43.4  30 cm proximal to floor at lateral plantar foot 48.5  20 cm proximal to floor at lateral plantar foot 37.4  10 cm proximal to floor at lateral plantar foot 29.4  Circumference of ankle/heel   5 cm proximal to 1st MTP joint 24.5  Across MTP joint 23.4  Around proximal great toe 8.2  (Blank rows = not tested)    GAIT: Distance walked: to room and back to front office after rx Assistive device utilized: Environmental consultant - 4 wheeled Level of assistance: Modified independence Comments: flexed posture, decreased heel strike toe o    TODAY'S TREATMENT  08/13/2022 Pts wraps removed and leg washed.  Performed MLD to Right LE, Supraclavicular, 5 Breaths, Right axillary and Inguinal LN's, right lateral leg, medial to lateral and lateral leg, retracing pathways then lower leg retracing steps, and then foot and ankle retracing all steps and  ending with LN's. Compression bandages applied: lotion to entire RUE, applied TG soft, elastomull to digits 1-5, artiflex foot to knee, 8 cm wrap foot to ankle with figure 8/s, 10 cm wrap with 2 heel locks and then up leg., last 10 cm from ankle to knee.  Large TG soft from below knee to groin folded at top as therapist does not feel a wrap here would stay   08/13/2022 Pts wraps removed and leg washed. Pt remeasured. Applied cocoa butter to right leg Performed MLD to Right LE, Supraclavicular, 5 Breaths, Right axillary and Inguinal LN's, right lateral leg, medial to lateral and lateral leg, retracing pathways then lower leg retracing steps, and then foot and ankle retracing all steps and ending with LN's. Pt was wrapped by therapist applied TG soft, toe wrap 2 inelastomull toes 1-5, artiflex foot to knee, 8 cm wrap foot to ankle with figure 8/s, 10 cm wrap with 2 heel locks and then up leg., last 10 cm  lower calf to knee with TG soft pulled over it.   Due to swelling at Distal thigh decided to try large TG soft from below knee to groin folded at top as therapist does not feel a wrap here would stay   08/11/2022 Pts wraps removed and leg washed. Applied cocoa butter to right leg. Marland Kitchen Performed MLD to Right LE, supraclavicular, 5 breaths, right axillary and  inguinal LN's, right lateral leg, medial to lateral, and lateral leg retracing pathways, then lower leg retracing all steps and then foot and ankle retracing all steps and ending with LN's. Pt was wrapped by therapist:Therapist applied TG soft, Toe wrap toes 1-5 with 2 in elastomull,  Artiflex foot to knee,8 cm wrap to foot and ankle with figure 8, 10 cm wrap for 2 heel locks and then up leg, and then 10 cm wrap mid le to knee with TG soft pulled over it. Used new wraps today and pt will wash wraps that were removed today. Pt gave approval to send demographics to [facility]   08/08/2022 Pts wraps removed and leg washed. Applied cocoa butter to right leg.  Looked at her shoe that she had cut the front from but it has a velcro strap and it did fit over her wraps so she work well for her. Performed MLD to Right LE, supraclavicular, 5 breaths, right axillary and inguinal LN's, right lateral leg, medial to lateral, and lateral leg retracing pathways, then lower leg retracing all steps and then foot and ankle retracing all steps and ending with LN's. Pt was wrapped by therapist:Therapist applied TG soft, Toe wrap toes 1-4, Artiflex ankle to knee,8 cm wrap to foot and ankle with figure 8, 10 cm wrap for 2 heel locks and then up leg, and then 10 cm wrap mid le to knee with TG soft pulled over it. Reminded pt how to exercise leg to loosen wraps and to remove if it gets uncomfortable. I showed pt the Flexitouch and she is not interested in having her benefits checked.     08/06/2022 Showed pt velcro wraps to see if she thinks she can get them on. They were not large enough for her to anchor but she seemed to think she could. Answered pt questions about transportation and caregiver and gave her contact numbers for Medicaid. Instructed pts son in compression bandaging. Therapist applied TG soft, Toe wrap toes 1-4, Artiflex ankle to knee,8 cm wrap to foot and ankle with figure 8, 10 cm wrap for 2 heel locks and then up leg, and then 10 cm wrap mid lef to knee with TG soft pulled over it. Pts son practiced 1st 2 wraps for foot and ankle and heel locks and did quite well .These were left on and therapist did 3rd leg wrap to knee. Pt was shown how to perform exercises to loosen wraps, and was advised to remove wraps if they slide or become too uncomfortable. She was also given written instructions for laundering, removing wraps, and video information for bandaging.   07/21/2022 Discussed treatment including compression bandaging, MLD, Remedial exercise, and different forms of compression garments. Pt might benefit from Velcro wraps instead of normal compression garments.  Discussed # of visits per week and length of stay, need for different footwear ie cast shoes which pt has, and possibly having someone we can train to wrap so she can shower/bathe  PATIENT EDUCATION:  Education details: Treatment plan, types of compression, footwear, Person educated: Patient and Child(ren),son Chartered loss adjuster method: Explanation Education comprehension: verbalized understanding   HOME EXERCISE PROGRAM: NA  ASSESSMENT:  CLINICAL IMPRESSION: Continued with MLD to RLE followed by compression bandaging. Pt is demonstrating good reduction of RLE swelling especially compared to her LLE. Will continue complete CDT until pt reaches maximal reduction of edema.   OBJECTIVE IMPAIRMENTS decreased activity tolerance, difficulty walking, and increased edema.   ACTIVITY LIMITATIONS carrying, standing, stairs, bed mobility, and  locomotion levelactivities requiring standing, stairs, extended walking  PARTICIPATION LIMITATIONS:  as above  PERSONAL FACTORS Age, Time since onset of injury/illness/exacerbation, and 1-2 comorbidities: Lymphoma, prioer DVT/PE  are also affecting patient's functional outcome.   REHAB POTENTIAL: Excellent  CLINICAL DECISION MAKING: Stable/uncomplicated  EVALUATION COMPLEXITY: Low  GOALS: Goals reviewed with patient? Yes  SHORT TERM GOALS: Target date: 09/05/2022    Pt will have decreased edema at 10 cm prox to floor by atleast 2.5 cm Baseline LEFT 29.4, RIGHT 34.3 Goal status:right 08/08/2022  2.  Pt will have decreased edema at 30 cm prox to floor by 3 cm Baseline: Left 54.6, Right 48.5 Goal status: INITIAL  3.  Pts. Family member will be independent in compression bandaging so pt may bathe at home. Baseline: dependent Goal status: INITIAL  LONG TERM GOALS: Target date: 09/26/2022    Pt will have decreased edema at 10 cm prox to floor by 4 cm Baseline: left 37.4, Right 34/3 Goal status: INITIAL  2.  Pt will have edema reduction at 30 cm  prox to floor by 5 cm Baseline: Left 54.6, right 48.5 Goal status: INITIAL  3.  Pt will be fit with appropriate compression garments;likely velcro wrap, and will be independent in their use Baseline:  Goal status: INITIAL  4.  Pt will have flexitouch demo to determine her desire for having the pump to assist with swelling Baseline:  Goal status: INITIAL   PLAN: PT FREQUENCY: 3x/week  PT DURATION: 6 weeks  PLANNED INTERVENTIONS: Therapeutic exercises, Patient/Family education, Self Care, Orthotic/Fit training, Manual lymph drainage, Compression bandaging, Vasopneumatic device, Manual therapy, and Re-evaluation  PLAN FOR NEXT SESSION: Pt NOT interested in Flexitouch, continue MLD and wrapping, How was TG soft at thigh? wants to just wrap right leg first to be sure she tolerates OK .wants to  just do 1 leg until it is done and then the other.  Will email [facility] re;wraps and consider measuring for garments on Right Fri or Mon.   [facility], PT 08/15/2022, 12:06 PM

## 2022-08-18 ENCOUNTER — Ambulatory Visit: Payer: Medicare Other

## 2022-08-18 DIAGNOSIS — C8330 Diffuse large B-cell lymphoma, unspecified site: Secondary | ICD-10-CM | POA: Diagnosis not present

## 2022-08-18 DIAGNOSIS — I890 Lymphedema, not elsewhere classified: Secondary | ICD-10-CM

## 2022-08-18 NOTE — Therapy (Signed)
OUTPATIENT PHYSICAL THERAPY ONCOLOGY TREATMENT  Patient Name: Lauren Santana MRN: [id number] DOB:18-Mar-1951, 71 y.o., female Today's Date: 08/18/2022   PT End of Session - 08/18/22 1202     Visit Number 7    Number of Visits 18    Date for PT Re-Evaluation 09/01/22    PT Start Time 1203    PT Stop Time 1259    PT Time Calculation (min) 56 min    Activity Tolerance Patient tolerated treatment well    Behavior During Therapy [facility] for tasks assessed/performed             Past Medical History:  Diagnosis Date   Arthritis    Edema    B/LLE   Endometriosis    High cholesterol    Hypertension    Numbness    feet   Pre-diabetes    Pulmonary embolism (HCC)    recurrent, second episode in 06/2020   Walker as ambulation aid    Wears dentures    Wears glasses    Past Surgical History:  Procedure Laterality Date   ABDOMINAL HYSTERECTOMY     and BSO   CATARACT EXTRACTION Left 10/2018   CESAREAN SECTION  1969, 1971   COLONOSCOPY     EYE SURGERY Bilateral    CATARACT SX   HERNIA REPAIR  2001   incisional   IR CATHETER TUBE CHANGE  10/12/2020   IR CATHETER TUBE CHANGE  12/07/2020   IR CHOLANGIOGRAM EXISTING TUBE  09/28/2020   IR PERC CHOLECYSTOSTOMY  08/17/2020   IR RADIOLOGIST EVAL & MGMT  11/22/2020   JOINT REPLACEMENT Left    Knee   MAXILLARY ANTROSTOMY Right 01/20/2020   Procedure: MAXILLARY ANTROSTOMY  WITH BIOPSY CALDWELL APPROACH;  Surgeon: Jerrell Belfast, MD;  Location: [place];  Service: ENT;  Laterality: Right;   MULTIPLE TOOTH EXTRACTIONS  2011   NASAL ENDOSCOPY Right 02/17/2020   Procedure: NASAL ENDOSCOPY WITH RIGHT INFERIOR TURNBINATE REDUCTION;  Surgeon: Jerrell Belfast, MD;  Location: [facility] OR;  Service: ENT;  Laterality: Right;   neck mass removal     "fatty tissue, not cancerous" per pt's son   SINUS ENDO WITH FUSION Right 01/20/2020   Procedure: SINUS ENDO WITH FUSION WITH BIOSPY;  Surgeon: Jerrell Belfast, MD;  Location: [place];  Service: ENT;   Laterality: Right;   TOTAL KNEE ARTHROPLASTY Left 07/17/2014   Procedure: TOTAL KNEE ARTHROPLASTY;  Surgeon: Kerin Salen, MD;  Location: [place];  Service: Orthopedics;  Laterality: Left;   Patient Active Problem List   Diagnosis Date Noted   Anticoagulated 12/11/2020   Cholecystitis, acute 08/16/2020   Abdominal pain 08/16/2020   Severe sepsis ([place]) 08/16/2020   Pressure injury of skin 08/16/2020   Atrial fibrillation ([place]) 07/24/2020   Thrombocytopenia ([place]) 07/24/2020   Anemia of chronic disease 07/24/2020   Chronic pain 07/24/2020   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[place]) 07/24/2020   Pulmonary embolism ([place]) 07/19/2020   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   S/P BSO (bilateral salpingo-oophorectomy) 01/08/2017   S/P hysterectomy 01/08/2017   Tachycardia    Acute deep vein thrombosis (DVT) of right lower extremity ([place]) 02/14/2016   Pulmonary emboli ([place]) 02/13/2016   PE (pulmonary embolism) 02/13/2016   AKI (acute kidney injury) ([place]) 02/13/2016   Pulmonary embolism with acute cor pulmonale (HCC)    Pelvic mass in female    Obesity (BMI 30-39.9) 08/20/2015   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   Essential hypertension, benign 07/23/2014  Edema 07/23/2014   Dyslipidemia 07/23/2014   Lower extremity numbness 07/23/2014   Arthritis of knee 07/17/2014   Postmenopausal bleeding 03/10/2012   Endometriosis of pelvis 01/22/2007    PCP: Beatrix Shipper, MD  REFERRING PROVIDER: [name], MD  REFERRING DIAG: Diffuse large B cell Lymphoma  THERAPY DIAG:  Lymphedema  Lymphoma, large-cell, diffuse ([place])  ONSET DATE: 2 years ago  Rationale for Evaluation and Treatment Rehabilitation  SUBJECTIVE                                                                                                                                                                                           SUBJECTIVE STATEMENT: My legs went out on me over the  weekend. I hope it isn't the wraps making me feel weak.    PERTINENT HISTORY:  The patient is a 71 year old woman with a history of DLBCL.She has completed 6 cycles of R-mini CHOP with cycle 6-day 1 being 10/11/2020.  She has chronic Nausea/vomiting but she takes some liquid Sulcrafate and this has helped.  At her office visit  in April with MD she had no clinical signs consistent with disease reoccurence.  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  She has taken furosemide for 2 years with no improvement. She has compression stockings but she has trouble getting them on and does not wear them. She is here with her son [name].(Friend of Dr. Ron Agee)  PAIN:  Are you having pain? Yesfrom neuropathy NPRS scale: unable to rate/10 Pain location: bilateral feet. Pain orientation: Bilateral  PAIN TYPE: nervy Pain description: constant  Aggravating factors: nothing Relieving factors: nothing  PRECAUTIONS: Other: B cell lymphoma, DM  WEIGHT BEARING RESTRICTIONS No  FALLS:  Has patient fallen in last 6 months? no  LIVING ENVIRONMENT: Lives with: Lives Alone Lives in: House/apartment Stairs: No;  Has following equipment at home: Environmental consultant - 4 wheeled, Wheelchair (manual), shower chair, Shower bench, and bed side commode  OCCUPATION: retired Aeronautical engineer  LEISURE: reading, prayer, encourager to family and friends  HAND DOMINANCE : right   PRIOR LEVEL OF FUNCTION: Independent with household mobility with device, even does her own sweeping and mopping  PATIENT GOALS Decrease leg swelling   OBJECTIVE  COGNITION:  Overall cognitive status: Within functional limits for tasks assessed   PALPATION: Fibrosis noted especially right calf region, mild pitting without tenderness at bilateral lower legs  OBSERVATIONS / OTHER ASSESSMENTS: Right leg with increased swelling greater than left, ankles very swollen above  her slipper but reduced at foot.  SENSATION:  Light touch: Appears intact    POSTURE: forward head, rounded shoulders    UPPER EXTREMITY STRENGTH: NT   LOWER EXTREMITY MMT: NT  LYMPHEDEMA ASSESSMENTS:   SURGERY TYPE/DATE: NA  NUMBER OF LYMPH NODES REMOVED: NA  CHEMOTHERAPY: Yes  RADIATION:no  HORMONE TREATMENT: no  INFECTIONS: NA  LYMPHEDEMA ASSESSMENTS:     LOWER EXTREMITY LANDMARK RIGHT eval 08/08/2022 08/13/2022  At groin     30 cm proximal to suprapatella     20 cm proximal to suprapatella     10 cm proximal to suprapatella     At midpatella / popliteal crease 47.3    30 cm proximal to floor at lateral plantar foot 54.6 49.5 50  20 cm proximal to floor at lateral plantar foot 39.9 37.1 36.8  10 cm proximal to floor at lateral plantar foot 34.3 27.7 26.3  Circumference of ankle/heel     5 cm proximal to 1st MTP joint 27.0    Across MTP joint 24.3    Around proximal great toe 9.4    (Blank rows = not tested)  LOWER EXTREMITY LANDMARK LEFT eval  At groin   30 cm proximal to suprapatella   20 cm proximal to suprapatella   10 cm proximal to suprapatella   At midpatella / popliteal crease 43.4  30 cm proximal to floor at lateral plantar foot 48.5  20 cm proximal to floor at lateral plantar foot 37.4  10 cm proximal to floor at lateral plantar foot 29.4  Circumference of ankle/heel   5 cm proximal to 1st MTP joint 24.5  Across MTP joint 23.4  Around proximal great toe 8.2  (Blank rows = not tested)    GAIT: Distance walked: to room and back to front office after rx Assistive device utilized: Environmental consultant - 4 wheeled Level of assistance: Modified independence Comments: flexed posture, decreased heel strike toe o    TODAY'S TREATMENT  08/18/2022 Pts wraps removed and leg washed.  Pt measured for Juzo Compression wrap with slip on Feature and Juzo compression wrap foot. Awaiting contact with Lauen at [facility] to see if [employer] will pay secondary to Cancer rx  at [facility]. Compression bandages applied: lotion to entire RUE, applied TG soft, elastomull to digits 1-5, artiflex foot to knee, 8 cm wrap foot to ankle with figure 8/s, 10 cm wrap with 2 heel locks and then up leg., last 10 cm from ankle to knee.  Large TG soft from below knee to groin folded at top as therapist does not feel a wrap here would stay . Also tried a [employer] grip wrap at knee and distal thigh but it did not conform well.    08/13/2022 Pts wraps removed and leg washed.  Performed MLD to Right LE, Supraclavicular, 5 Breaths, Right axillary and Inguinal LN's, right lateral leg, medial to lateral and lateral leg, retracing pathways then lower leg retracing steps, and then foot and ankle retracing all steps and ending with LN's. Compression bandages applied: lotion to entire RUE, applied TG soft, elastomull to digits 1-5, artiflex foot to knee, 8 cm wrap foot to ankle with figure 8/s, 10 cm wrap with 2 heel locks and then up leg., last 10 cm from ankle to knee.  Large TG soft from below knee to groin folded at top as therapist does not feel a wrap here would stay   08/13/2022 Pts wraps removed and leg washed. Pt remeasured. Applied cocoa butter to right leg Performed MLD to Right LE, Supraclavicular,  5 Breaths, Right axillary and Inguinal LN's, right lateral leg, medial to lateral and lateral leg, retracing pathways then lower leg retracing steps, and then foot and ankle retracing all steps and ending with LN's. Pt was wrapped by therapist applied TG soft, toe wrap 2 inelastomull toes 1-5, artiflex foot to knee, 8 cm wrap foot to ankle with figure 8/s, 10 cm wrap with 2 heel locks and then up leg., last 10 cm  lower calf to knee with TG soft pulled over it.   Due to swelling at Distal thigh decided to try large TG soft from below knee to groin folded at top as therapist does not feel a wrap here would stay   08/11/2022 Pts wraps removed and leg washed. Applied cocoa butter to  right leg. Marland Kitchen Performed MLD to Right LE, supraclavicular, 5 breaths, right axillary and inguinal LN's, right lateral leg, medial to lateral, and lateral leg retracing pathways, then lower leg retracing all steps and then foot and ankle retracing all steps and ending with LN's. Pt was wrapped by therapist:Therapist applied TG soft, Toe wrap toes 1-5 with 2 in elastomull,  Artiflex foot to knee,8 cm wrap to foot and ankle with figure 8, 10 cm wrap for 2 heel locks and then up leg, and then 10 cm wrap mid le to knee with TG soft pulled over it. Used new wraps today and pt will wash wraps that were removed today. Pt gave approval to send demographics to [facility]   08/08/2022 Pts wraps removed and leg washed. Applied cocoa butter to right leg. Looked at her shoe that she had cut the front from but it has a velcro strap and it did fit over her wraps so she work well for her. Performed MLD to Right LE, supraclavicular, 5 breaths, right axillary and inguinal LN's, right lateral leg, medial to lateral, and lateral leg retracing pathways, then lower leg retracing all steps and then foot and ankle retracing all steps and ending with LN's. Pt was wrapped by therapist:Therapist applied TG soft, Toe wrap toes 1-4, Artiflex ankle to knee,8 cm wrap to foot and ankle with figure 8, 10 cm wrap for 2 heel locks and then up leg, and then 10 cm wrap mid le to knee with TG soft pulled over it. Reminded pt how to exercise leg to loosen wraps and to remove if it gets uncomfortable. I showed pt the Flexitouch and she is not interested in having her benefits checked.     08/06/2022 Showed pt velcro wraps to see if she thinks she can get them on. They were not large enough for her to anchor but she seemed to think she could. Answered pt questions about transportation and caregiver and gave her contact numbers for Medicaid. Instructed pts son in compression bandaging. Therapist applied TG soft, Toe wrap toes 1-4, Artiflex ankle to  knee,8 cm wrap to foot and ankle with figure 8, 10 cm wrap for 2 heel locks and then up leg, and then 10 cm wrap mid lef to knee with TG soft pulled over it. Pts son practiced 1st 2 wraps for foot and ankle and heel locks and did quite well .These were left on and therapist did 3rd leg wrap to knee. Pt was shown how to perform exercises to loosen wraps, and was advised to remove wraps if they slide or become too uncomfortable. She was also given written instructions for laundering, removing wraps, and video information for bandaging.   07/21/2022 Discussed treatment including  compression bandaging, MLD, Remedial exercise, and different forms of compression garments. Pt might benefit from Velcro wraps instead of normal compression garments. Discussed # of visits per week and length of stay, need for different footwear ie cast shoes which pt has, and possibly having someone we can train to wrap so she can shower/bathe  PATIENT EDUCATION:  Education details: Treatment plan, types of compression, footwear, Person educated: Patient and Child(ren),son Chartered loss adjuster method: Explanation Education comprehension: verbalized understanding   HOME EXERCISE PROGRAM: NA  ASSESSMENT:  CLINICAL IMPRESSION: Pt measured for velcro wraps today but not ordered yet. Spoke with her son and he would like [place] to order on Wed. If [employer] does not come through. [place] wrap did not conform well and could not be used at the knee, however, The TG soft does seem to have softened her leg there.  OBJECTIVE IMPAIRMENTS decreased activity tolerance, difficulty walking, and increased edema.   ACTIVITY LIMITATIONS carrying, standing, stairs, bed mobility, and locomotion levelactivities requiring standing, stairs, extended walking  PARTICIPATION LIMITATIONS:  as above  PERSONAL FACTORS Age, Time since onset of injury/illness/exacerbation, and 1-2 comorbidities: Lymphoma, prioer DVT/PE  are also affecting patient's functional  outcome.   REHAB POTENTIAL: Excellent  CLINICAL DECISION MAKING: Stable/uncomplicated  EVALUATION COMPLEXITY: Low  GOALS: Goals reviewed with patient? Yes  SHORT TERM GOALS: Target date: 09/08/2022    Pt will have decreased edema at 10 cm prox to floor by atleast 2.5 cm Baseline LEFT 29.4, RIGHT 34.3 Goal status:right 08/08/2022  2.  Pt will have decreased edema at 30 cm prox to floor by 3 cm Baseline: Left 54.6, Right 48.5 Goal status: INITIAL  3.  Pts. Family member will be independent in compression bandaging so pt may bathe at home. Baseline: dependent Goal status: INITIAL  LONG TERM GOALS: Target date: 09/29/2022    Pt will have decreased edema at 10 cm prox to floor by 4 cm Baseline: left 37.4, Right 34/3 Goal status: INITIAL  2.  Pt will have edema reduction at 30 cm prox to floor by 5 cm Baseline: Left 54.6, right 48.5 Goal status: INITIAL  3.  Pt will be fit with appropriate compression garments;likely velcro wrap, and will be independent in their use Baseline:  Goal status: INITIAL  4.  Pt will have flexitouch demo to determine her desire for having the pump to assist with swelling Baseline:  Goal status: INITIAL   PLAN: PT FREQUENCY: 3x/week  PT DURATION: 6 weeks  PLANNED INTERVENTIONS: Therapeutic exercises, Patient/Family education, Self Care, Orthotic/Fit training, Manual lymph drainage, Compression bandaging, Vasopneumatic device, Manual therapy, and Re-evaluation  PLAN FOR NEXT SESSION:Order garments next visit, Pt NOT interested in Flexitouch, continue MLD and wrapping, How was TG soft at thigh? wants to just wrap right leg first to be sure she tolerates OK .wants to  just do 1 leg until it is done and then the other.  Will email [employer] re;wraps and consider measuring for garments on Right Fri or Mon.   Claris Pong, PT 08/18/2022, 1:15 PM

## 2022-08-19 DIAGNOSIS — L89320 Pressure ulcer of left buttock, unstageable: Secondary | ICD-10-CM | POA: Diagnosis not present

## 2022-08-19 DIAGNOSIS — I10 Essential (primary) hypertension: Secondary | ICD-10-CM | POA: Diagnosis not present

## 2022-08-20 ENCOUNTER — Ambulatory Visit: Payer: Medicare Other

## 2022-08-20 DIAGNOSIS — C8330 Diffuse large B-cell lymphoma, unspecified site: Secondary | ICD-10-CM

## 2022-08-20 DIAGNOSIS — I890 Lymphedema, not elsewhere classified: Secondary | ICD-10-CM

## 2022-08-20 NOTE — Therapy (Signed)
OUTPATIENT PHYSICAL THERAPY ONCOLOGY TREATMENT  Patient Name: Lauren Santana MRN: [id number] DOB:1951-07-27, 71 y.o., female Today's Date: 08/20/2022   PT End of Session - 08/20/22 1204     Visit Number 8    Number of Visits 18    Date for PT Re-Evaluation 09/01/22    PT Start Time 1205    PT Stop Time 2952    PT Time Calculation (min) 53 min    Activity Tolerance Patient tolerated treatment well    Behavior During Therapy [facility] for tasks assessed/performed             Past Medical History:  Diagnosis Date   Arthritis    Edema    B/LLE   Endometriosis    High cholesterol    Hypertension    Numbness    feet   Pre-diabetes    Pulmonary embolism (HCC)    recurrent, second episode in 06/2020   Walker as ambulation aid    Wears dentures    Wears glasses    Past Surgical History:  Procedure Laterality Date   ABDOMINAL HYSTERECTOMY     and BSO   CATARACT EXTRACTION Left 10/2018   CESAREAN SECTION  1969, 1971   COLONOSCOPY     EYE SURGERY Bilateral    CATARACT SX   HERNIA REPAIR  2001   incisional   IR CATHETER TUBE CHANGE  10/12/2020   IR CATHETER TUBE CHANGE  12/07/2020   IR CHOLANGIOGRAM EXISTING TUBE  09/28/2020   IR PERC CHOLECYSTOSTOMY  08/17/2020   IR RADIOLOGIST EVAL & MGMT  11/22/2020   JOINT REPLACEMENT Left    Knee   MAXILLARY ANTROSTOMY Right 01/20/2020   Procedure: MAXILLARY ANTROSTOMY  WITH BIOPSY CALDWELL APPROACH;  Surgeon: Jerrell Belfast, MD;  Location: [place];  Service: ENT;  Laterality: Right;   MULTIPLE TOOTH EXTRACTIONS  2011   NASAL ENDOSCOPY Right 02/17/2020   Procedure: NASAL ENDOSCOPY WITH RIGHT INFERIOR TURNBINATE REDUCTION;  Surgeon: Jerrell Belfast, MD;  Location: [facility] OR;  Service: ENT;  Laterality: Right;   neck mass removal     "fatty tissue, not cancerous" per pt's son   SINUS ENDO WITH FUSION Right 01/20/2020   Procedure: SINUS ENDO WITH FUSION WITH BIOSPY;  Surgeon: Jerrell Belfast, MD;  Location: [place];  Service: ENT;   Laterality: Right;   TOTAL KNEE ARTHROPLASTY Left 07/17/2014   Procedure: TOTAL KNEE ARTHROPLASTY;  Surgeon: Kerin Salen, MD;  Location: [place];  Service: Orthopedics;  Laterality: Left;   Patient Active Problem List   Diagnosis Date Noted   Anticoagulated 12/11/2020   Cholecystitis, acute 08/16/2020   Abdominal pain 08/16/2020   Severe sepsis ([place]) 08/16/2020   Pressure injury of skin 08/16/2020   Atrial fibrillation ([place]) 07/24/2020   Thrombocytopenia ([place]) 07/24/2020   Anemia of chronic disease 07/24/2020   Chronic pain 07/24/2020   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[place]) 07/24/2020   Pulmonary embolism (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   S/P BSO (bilateral salpingo-oophorectomy) 01/08/2017   S/P hysterectomy 01/08/2017   Tachycardia    Acute deep vein thrombosis (DVT) of right lower extremity ([place]) 02/14/2016   Pulmonary emboli ([place]) 02/13/2016   PE (pulmonary embolism) 02/13/2016   AKI (acute kidney injury) ([place]) 02/13/2016   Pulmonary embolism with acute cor pulmonale (HCC)    Pelvic mass in female    Obesity (BMI 30-39.9) 08/20/2015   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   Essential hypertension, benign 07/23/2014  Edema 07/23/2014   Dyslipidemia 07/23/2014   Lower extremity numbness 07/23/2014   Arthritis of knee 07/17/2014   Postmenopausal bleeding 03/10/2012   Endometriosis of pelvis 01/22/2007    PCP: Beatrix Shipper, MD  REFERRING PROVIDER: [name], MD  REFERRING DIAG: Diffuse large B cell Lymphoma  THERAPY DIAG:  Lymphedema  Lymphoma, large-cell, diffuse ([place])  ONSET DATE: 2 years ago  Rationale for Evaluation and Treatment Rehabilitation  SUBJECTIVE                                                                                                                                                                                           SUBJECTIVE STATEMENT: My wrap did fine. I even washed the  other wraps myself.    PERTINENT HISTORY:  The patient is a 71 year old woman with a history of DLBCL.She has completed 6 cycles of R-mini CHOP with cycle 6-day 1 being 10/11/2020.  She has chronic Nausea/vomiting but she takes some liquid Sulcrafate and this has helped.  At her office visit  in April with MD she had no clinical signs consistent with disease reoccurence.  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  She has taken furosemide for 2 years with no improvement. She has compression stockings but she has trouble getting them on and does not wear them. She is here with her son [name].(Friend of Dr. Ron Agee)  PAIN:  Are you having pain? Yesfrom neuropathy NPRS scale: unable to rate/10 Pain location: bilateral feet. Pain orientation: Bilateral  PAIN TYPE: nervy Pain description: constant  Aggravating factors: nothing Relieving factors: nothing  PRECAUTIONS: Other: B cell lymphoma, DM  WEIGHT BEARING RESTRICTIONS No  FALLS:  Has patient fallen in last 6 months? no  LIVING ENVIRONMENT: Lives with: Lives Alone Lives in: House/apartment Stairs: No;  Has following equipment at home: Environmental consultant - 4 wheeled, Wheelchair (manual), shower chair, Shower bench, and bed side commode  OCCUPATION: retired Aeronautical engineer  LEISURE: reading, prayer, encourager to family and friends  HAND DOMINANCE : right   PRIOR LEVEL OF FUNCTION: Independent with household mobility with device, even does her own sweeping and mopping  PATIENT GOALS Decrease leg swelling   OBJECTIVE  COGNITION:  Overall cognitive status: Within functional limits for tasks assessed   PALPATION: Fibrosis noted especially right calf region, mild pitting without tenderness at bilateral lower legs  OBSERVATIONS / OTHER ASSESSMENTS: Right leg with increased swelling greater than left, ankles very swollen above her slipper but reduced at  foot.  SENSATION:  Light touch: Appears intact  POSTURE: forward head, rounded shoulders    UPPER EXTREMITY STRENGTH: NT   LOWER EXTREMITY MMT: NT  LYMPHEDEMA ASSESSMENTS:   SURGERY TYPE/DATE: NA  NUMBER OF LYMPH NODES REMOVED: NA  CHEMOTHERAPY: Yes  RADIATION:no  HORMONE TREATMENT: no  INFECTIONS: NA  LYMPHEDEMA ASSESSMENTS:     LOWER EXTREMITY LANDMARK RIGHT eval 08/08/2022 08/13/2022  At groin     30 cm proximal to suprapatella     20 cm proximal to suprapatella     10 cm proximal to suprapatella     At midpatella / popliteal crease 47.3    30 cm proximal to floor at lateral plantar foot 54.6 49.5 50  20 cm proximal to floor at lateral plantar foot 39.9 37.1 36.8  10 cm proximal to floor at lateral plantar foot 34.3 27.7 26.3  Circumference of ankle/heel     5 cm proximal to 1st MTP joint 27.0    Across MTP joint 24.3    Around proximal great toe 9.4    (Blank rows = not tested)  LOWER EXTREMITY LANDMARK LEFT eval  At groin   30 cm proximal to suprapatella   20 cm proximal to suprapatella   10 cm proximal to suprapatella   At midpatella / popliteal crease 43.4  30 cm proximal to floor at lateral plantar foot 48.5  20 cm proximal to floor at lateral plantar foot 37.4  10 cm proximal to floor at lateral plantar foot 29.4  Circumference of ankle/heel   5 cm proximal to 1st MTP joint 24.5  Across MTP joint 23.4  Around proximal great toe 8.2  (Blank rows = not tested)    GAIT: Distance walked: to room and back to front office after rx Assistive device utilized: Environmental consultant - 4 wheeled Level of assistance: Modified independence Comments: flexed posture, decreased heel strike toe off     TODAY'S TREATMENT  08/20/2022  Pts wraps removed and leg washed.  Performed MLD to Right LE, Supraclavicular, 5 Breaths, Right axillary and Inguinal LN's, right lateral leg, medial to lateral and lateral leg, retracing pathways then lower leg retracing steps, and  then foot and ankle retracing all steps and ending with LN's. Compression bandages applied: cocoa butter to entire RUE, applied TG soft, elastomull to digits 1-5, artiflex foot to knee, 8 cm wrap foot to ankle with figure 8/s, 10 cm wrap with 2 heel locks and then up leg., last 10 cm from ankle to knee.  Large TG soft from below knee to groin folded at top . Ordered compression garments for pt using her Credit Card: XL MAX Juzo compression wrap with slip on feature and Juzo compression wrap Foot size Medium. Estimated shipping time 4-8 days. Pt has confirmation   08/18/2022 Pts wraps removed and leg washed.  Pt measured for Juzo Compression wrap with slip on Feature and Juzo compression wrap foot. Awaiting contact with Lauen at [facility] to see if [employer] will pay secondary to Cancer rx at [facility]. Compression bandages applied: lotion to entire RUE, applied TG soft, elastomull to digits 1-5, artiflex foot to knee, 8 cm wrap foot to ankle with figure 8/s, 10 cm wrap with 2 heel locks and then up leg., last 10 cm from ankle to knee.  Large TG soft from below knee to groin folded at top as therapist does not feel a wrap here would stay . Also tried a [employer] grip wrap at knee and distal thigh but it did not conform well.    08/13/2022  Pts wraps removed and leg washed.  Performed MLD to Right LE, Supraclavicular, 5 Breaths, Right axillary and Inguinal LN's, right lateral leg, medial to lateral and lateral leg, retracing pathways then lower leg retracing steps, and then foot and ankle retracing all steps and ending with LN's. Compression bandages applied: lotion to entire RUE, applied TG soft, elastomull to digits 1-5, artiflex foot to knee, 8 cm wrap foot to ankle with figure 8/s, 10 cm wrap with 2 heel locks and then up leg., last 10 cm from ankle to knee.  Large TG soft from below knee to groin folded at top as therapist does not feel a wrap here would stay   08/13/2022 Pts wraps removed and  leg washed. Pt remeasured. Applied cocoa butter to right leg Performed MLD to Right LE, Supraclavicular, 5 Breaths, Right axillary and Inguinal LN's, right lateral leg, medial to lateral and lateral leg, retracing pathways then lower leg retracing steps, and then foot and ankle retracing all steps and ending with LN's. Pt was wrapped by therapist applied TG soft, toe wrap 2 inelastomull toes 1-5, artiflex foot to knee, 8 cm wrap foot to ankle with figure 8/s, 10 cm wrap with 2 heel locks and then up leg., last 10 cm  lower calf to knee with TG soft pulled over it.   Due to swelling at Distal thigh decided to try large TG soft from below knee to groin folded at top as therapist does not feel a wrap here would stay   08/11/2022 Pts wraps removed and leg washed. Applied cocoa butter to right leg. Marland Kitchen Performed MLD to Right LE, supraclavicular, 5 breaths, right axillary and inguinal LN's, right lateral leg, medial to lateral, and lateral leg retracing pathways, then lower leg retracing all steps and then foot and ankle retracing all steps and ending with LN's. Pt was wrapped by therapist:Therapist applied TG soft, Toe wrap toes 1-5 with 2 in elastomull,  Artiflex foot to knee,8 cm wrap to foot and ankle with figure 8, 10 cm wrap for 2 heel locks and then up leg, and then 10 cm wrap mid le to knee with TG soft pulled over it. Used new wraps today and pt will wash wraps that were removed today. Pt gave approval to send demographics to [facility]   08/08/2022 Pts wraps removed and leg washed. Applied cocoa butter to right leg. Looked at her shoe that she had cut the front from but it has a velcro strap and it did fit over her wraps so she work well for her. Performed MLD to Right LE, supraclavicular, 5 breaths, right axillary and inguinal LN's, right lateral leg, medial to lateral, and lateral leg retracing pathways, then lower leg retracing all steps and then foot and ankle retracing all steps and ending with  LN's. Pt was wrapped by therapist:Therapist applied TG soft, Toe wrap toes 1-4, Artiflex ankle to knee,8 cm wrap to foot and ankle with figure 8, 10 cm wrap for 2 heel locks and then up leg, and then 10 cm wrap mid le to knee with TG soft pulled over it. Reminded pt how to exercise leg to loosen wraps and to remove if it gets uncomfortable. I showed pt the Flexitouch and she is not interested in having her benefits checked.       07/21/2022 Discussed treatment including compression bandaging, MLD, Remedial exercise, and different forms of compression garments. Pt might benefit from Velcro wraps instead of normal compression garments. Discussed # of visits per  week and length of stay, need for different footwear ie cast shoes which pt has, and possibly having someone we can train to wrap so she can shower/bathe  PATIENT EDUCATION:  Education details: Treatment plan, types of compression, footwear, Person educated: Patient and Child(ren),son Chartered loss adjuster method: Explanation Education comprehension: verbalized understanding   HOME EXERCISE PROGRAM: NA  ASSESSMENT:  CLINICAL IMPRESSION: Continued MLD and compression bandaging to right LE and ordered Juzo compression wraps using pts credit card as Thurnell Garbe would not cover due to not a [facility] MD. The TG soft does seem to have softened her leg at the area just above her knee and the lower leg looks very good. Will start left LE when pt receives her compression wraps for Right LE  OBJECTIVE IMPAIRMENTS decreased activity tolerance, difficulty walking, and increased edema.   ACTIVITY LIMITATIONS carrying, standing, stairs, bed mobility, and locomotion levelactivities requiring standing, stairs, extended walking  PARTICIPATION LIMITATIONS:  as above  PERSONAL FACTORS Age, Time since onset of injury/illness/exacerbation, and 1-2 comorbidities: Lymphoma, prioer DVT/PE  are also affecting patient's functional outcome.   REHAB POTENTIAL:  Excellent  CLINICAL DECISION MAKING: Stable/uncomplicated  EVALUATION COMPLEXITY: Low  GOALS: Goals reviewed with patient? Yes  SHORT TERM GOALS: Target date: 09/10/2022    Pt will have decreased edema at 10 cm prox to floor by atleast 2.5 cm Baseline LEFT 29.4, RIGHT 34.3 Goal status:right 08/08/2022  2.  Pt will have decreased edema at 30 cm prox to floor by 3 cm Baseline: Left 54.6, Right 48.5 Goal status: INITIAL  3.  Pts. Family member will be independent in compression bandaging so pt may bathe at home. Baseline: dependent Goal status: INITIAL  LONG TERM GOALS: Target date: 10/01/2022    Pt will have decreased edema at 10 cm prox to floor by 4 cm Baseline: left 37.4, Right 34/3 Goal status: INITIAL  2.  Pt will have edema reduction at 30 cm prox to floor by 5 cm Baseline: Left 54.6, right 48.5 Goal status: INITIAL  3.  Pt will be fit with appropriate compression garments;likely velcro wrap, and will be independent in their use Baseline:  Goal status: INITIAL  4.  Pt will have flexitouch demo to determine her desire for having the pump to assist with swelling Baseline:  Goal status: INITIAL   PLAN: PT FREQUENCY: 3x/week  PT DURATION: 6 weeks  PLANNED INTERVENTIONS: Therapeutic exercises, Patient/Family education, Self Care, Orthotic/Fit training, Manual lymph drainage, Compression bandaging, Vasopneumatic device, Manual therapy, and Re-evaluation  PLAN FOR NEXT SESSION: Measure pt Pt NOT interested in Flexitouch, continue MLD and wrapping, wants to just wrap right leg first to be sure she tolerates OK .wants to  just do 1 leg until it is done and then the other.  Start left LE when right LE wraps arrive   Claris Pong, PT 08/20/2022, 12:59 PM

## 2022-08-22 ENCOUNTER — Ambulatory Visit: Payer: Medicare Other

## 2022-08-22 DIAGNOSIS — C8330 Diffuse large B-cell lymphoma, unspecified site: Secondary | ICD-10-CM | POA: Diagnosis not present

## 2022-08-22 DIAGNOSIS — I890 Lymphedema, not elsewhere classified: Secondary | ICD-10-CM | POA: Diagnosis not present

## 2022-08-22 NOTE — Therapy (Signed)
OUTPATIENT PHYSICAL THERAPY ONCOLOGY TREATMENT  Patient Name: Lauren Santana MRN: [id number] DOB:[birth year], 71 y.o., female Today's Date: 08/22/2022   PT End of Session - 08/22/22 1207     Visit Number 9    Number of Visits 18    Date for PT Re-Evaluation 09/01/22    PT Start Time 1105    PT Stop Time 1200    PT Time Calculation (min) 55 min    Activity Tolerance Patient tolerated treatment well    Behavior During Therapy Lauren Santana for tasks assessed/performed              Past Medical History:  Diagnosis Date   Arthritis    Edema    B/LLE   Endometriosis    High cholesterol    Hypertension    Numbness    feet   Pre-diabetes    Pulmonary embolism ([place])    recurrent, second episode in 06/2020   Walker as ambulation aid    Wears dentures    Wears glasses    Past Surgical History:  Procedure Laterality Date   ABDOMINAL HYSTERECTOMY     and BSO   CATARACT EXTRACTION Left 10/2018   CESAREAN SECTION  1969, 1971   COLONOSCOPY     EYE SURGERY Bilateral    CATARACT SX   HERNIA REPAIR  2001   incisional   IR CATHETER TUBE CHANGE  10/12/2020   IR CATHETER TUBE CHANGE  12/07/2020   IR CHOLANGIOGRAM EXISTING TUBE  09/28/2020   IR PERC CHOLECYSTOSTOMY  08/17/2020   IR RADIOLOGIST EVAL & MGMT  11/22/2020   JOINT REPLACEMENT Left    Knee   MAXILLARY ANTROSTOMY Right 01/20/2020   Procedure: MAXILLARY ANTROSTOMY  WITH BIOPSY CALDWELL APPROACH;  Surgeon: Lauren Belfast, MD;  Location: [facility];  Service: ENT;  Laterality: Right;   MULTIPLE TOOTH EXTRACTIONS  2011   NASAL ENDOSCOPY Right 02/17/2020   Procedure: NASAL ENDOSCOPY WITH RIGHT INFERIOR TURNBINATE REDUCTION;  Surgeon: Lauren Belfast, MD;  Location: [facility] OR;  Service: ENT;  Laterality: Right;   neck mass removal     "fatty tissue, not cancerous" per pt's son   SINUS ENDO WITH FUSION Right 01/20/2020   Procedure: SINUS ENDO WITH FUSION WITH BIOSPY;  Surgeon: Lauren Belfast, MD;  Location: [place];  Service: ENT;   Laterality: Right;   TOTAL KNEE ARTHROPLASTY Left 07/17/2014   Procedure: TOTAL KNEE ARTHROPLASTY;  Surgeon: Kerin Salen, MD;  Location: [facility];  Service: Orthopedics;  Laterality: Left;   Patient Active Problem List   Diagnosis Date Noted   Anticoagulated 12/11/2020   Cholecystitis, acute 08/16/2020   Abdominal pain 08/16/2020   Severe sepsis ([place]) 08/16/2020   Pressure injury of skin 08/16/2020   Atrial fibrillation ([place]) 07/24/2020   Thrombocytopenia ([place]) 07/24/2020   Anemia of chronic disease 07/24/2020   Chronic pain 07/24/2020   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[place]) 07/24/2020   Pulmonary embolism ([place]) 07/19/2020   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   S/P BSO (bilateral salpingo-oophorectomy) 01/08/2017   S/P hysterectomy 01/08/2017   Tachycardia    Acute deep vein thrombosis (DVT) of right lower extremity ([place]) 02/14/2016   Pulmonary emboli ([place]) 02/13/2016   PE (pulmonary embolism) 02/13/2016   AKI (acute kidney injury) ([place]) 02/13/2016   Pulmonary embolism with acute cor pulmonale ([facility])    Pelvic mass in female    Obesity (BMI 30-39.9) 08/20/2015   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   Essential hypertension, benign  07/23/2014   Edema 07/23/2014   Dyslipidemia 07/23/2014   Lower extremity numbness 07/23/2014   Arthritis of knee 07/17/2014   Postmenopausal bleeding 03/10/2012   Endometriosis of pelvis 01/22/2007    PCP: Lauren Shipper, MD  REFERRING PROVIDER: [name], MD  REFERRING DIAG: Diffuse large B cell Lymphoma  THERAPY DIAG:  Lymphedema  Lymphoma, large-cell, diffuse ([place])  ONSET DATE: 2 years ago  Rationale for Evaluation and Treatment Rehabilitation  SUBJECTIVE                                                                                                                                                                                           SUBJECTIVE STATEMENT: My wrap did fine. I think my new  garments came but I couldn't bring them today.    PERTINENT HISTORY:  The patient is a 71 year old woman with a history of DLBCL.She has completed 6 cycles of R-mini CHOP with cycle 6-day 1 being 10/11/2020.  She has chronic Nausea/vomiting but she takes some liquid Sulcrafate and this has helped.  At her office visit  in April with MD she had no clinical signs consistent with disease reoccurence.  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  She has taken furosemide for 2 years with no improvement. She has compression stockings but she has trouble getting them on and does not wear them. She is here with her son Lauren Santana.(Friend of Dr. Ron Santana)  PAIN:  Are you having pain? Yesfrom neuropathy NPRS scale: unable to rate/10 Pain location: bilateral feet. Pain orientation: Bilateral  PAIN TYPE: nervy Pain description: constant  Aggravating factors: nothing Relieving factors: nothing  PRECAUTIONS: Other: B cell lymphoma, DM  WEIGHT BEARING RESTRICTIONS No  FALLS:  Has patient fallen in last 6 months? no  LIVING ENVIRONMENT: Lives with: Lives Alone Lives in: House/apartment Stairs: No;  Has following equipment at home: Environmental consultant - 4 wheeled, Wheelchair (manual), shower chair, Shower bench, and bed side commode  OCCUPATION: retired Aeronautical engineer  LEISURE: reading, prayer, encourager to family and friends  HAND DOMINANCE : right   PRIOR LEVEL OF FUNCTION: Independent with household mobility with device, even does her own sweeping and mopping  PATIENT GOALS Decrease leg swelling   OBJECTIVE  COGNITION:  Overall cognitive status: Within functional limits for tasks assessed   PALPATION: Fibrosis noted especially right calf region, mild pitting without tenderness at bilateral lower legs  OBSERVATIONS / OTHER ASSESSMENTS: Right leg with increased swelling greater than left, ankles very swollen above her slipper  but reduced at foot.  SENSATION:  Light touch: Appears intact    POSTURE: forward head, rounded shoulders    UPPER EXTREMITY STRENGTH: NT   LOWER EXTREMITY MMT: NT  LYMPHEDEMA ASSESSMENTS:   SURGERY TYPE/DATE: NA  NUMBER OF LYMPH NODES REMOVED: NA  CHEMOTHERAPY: Yes  RADIATION:no  HORMONE TREATMENT: no  INFECTIONS: NA  LYMPHEDEMA ASSESSMENTS:     LOWER EXTREMITY LANDMARK RIGHT eval 08/08/2022 08/13/2022 08/22/2022  At groin      30 cm proximal to suprapatella      20 cm proximal to suprapatella      10 cm proximal to suprapatella      At midpatella / popliteal crease 47.3     30 cm proximal to floor at lateral plantar foot 54.6 49.5 50 47.5  20 cm proximal to floor at lateral plantar foot 39.9 37.1 36.8 38.3  10 cm proximal to floor at lateral plantar foot 34.3 27.7 26.3 25.8  Circumference of ankle/heel      5 cm proximal to 1st MTP joint 27.0   23.2  Across MTP joint 24.3     Around proximal great toe 9.4   8.0  (Blank rows = not tested)  LOWER EXTREMITY LANDMARK LEFT eval  At groin   30 cm proximal to suprapatella   20 cm proximal to suprapatella   10 cm proximal to suprapatella   At midpatella / popliteal crease 43.4  30 cm proximal to floor at lateral plantar foot 48.5  20 cm proximal to floor at lateral plantar foot 37.4  10 cm proximal to floor at lateral plantar foot 29.4  Circumference of ankle/heel   5 cm proximal to 1st MTP joint 24.5  Across MTP joint 23.4  Around proximal great toe 8.2  (Blank rows = not tested)    GAIT: Distance walked: to room and back to front office after rx Assistive device utilized: Environmental consultant - 4 wheeled Level of assistance: Modified independence Comments: flexed posture, decreased heel strike toe off     TODAY'S TREATMENT  08/21/2022  Pts wraps removed and leg washed. Pt measured Performed MLD to Right LE, Supraclavicular, 5 Breaths, Right axillary and Inguinal LN's, right lateral leg, medial to lateral and  lateral leg, retracing pathways then lower leg retracing steps, and then foot and ankle retracing all steps and ending with LN's. Compression bandages applied: cocoa butter to entire RUE, applied TG soft, elastomull to digits 1-5, artiflex foot to knee, 8 cm wrap foot to ankle with figure 8/s, 10 cm wrap with 2 heel locks and then up leg., last 10 cm from ankle to knee.  Large TG soft from below knee to groin folded at top .  08/20/2022  Pts wraps removed and leg washed.  Performed MLD to Right LE, Supraclavicular, 5 Breaths, Right axillary and Inguinal LN's, right lateral leg, medial to lateral and lateral leg, retracing pathways then lower leg retracing steps, and then foot and ankle retracing all steps and ending with LN's. Compression bandages applied: cocoa butter to entire RUE, applied TG soft, elastomull to digits 1-5, artiflex foot to knee, 8 cm wrap foot to ankle with figure 8/s, 10 cm wrap with 2 heel locks and then up leg., last 10 cm from ankle to knee.  Large TG soft from below knee to groin folded at top . Ordered compression garments for pt using her Credit Card: XL MAX Juzo compression wrap with slip on feature and Juzo compression wrap Foot size Medium. Estimated shipping time 4-8 days. Pt has confirmation   08/18/2022  Pts wraps removed and leg washed.  Pt measured for Juzo Compression wrap with slip on Feature and Juzo compression wrap foot. Awaiting contact with Lauen at [facility] to see if [employer] will pay secondary to Cancer rx at [facility]. Compression bandages applied: lotion to entire RUE, applied TG soft, elastomull to digits 1-5, artiflex foot to knee, 8 cm wrap foot to ankle with figure 8/s, 10 cm wrap with 2 heel locks and then up leg., last 10 cm from ankle to knee.  Large TG soft from below knee to groin folded at top as therapist does not feel a wrap here would stay . Also tried a [employer] grip wrap at knee and distal thigh but it did not conform  well.  08/13/2022 Pts wraps removed and leg washed.  Performed MLD to Right LE, Supraclavicular, 5 Breaths, Right axillary and Inguinal LN's, right lateral leg, medial to lateral and lateral leg, retracing pathways then lower leg retracing steps, and then foot and ankle retracing all steps and ending with LN's. Compression bandages applied: lotion to entire RUE, applied TG soft, elastomull to digits 1-5, artiflex foot to knee, 8 cm wrap foot to ankle with figure 8/s, 10 cm wrap with 2 heel locks and then up leg., last 10 cm from ankle to knee.  Large TG soft from below knee to groin folded at top as therapist does not feel a wrap here would stay   08/13/2022 Pts wraps removed and leg washed. Pt remeasured. Applied cocoa butter to right leg Performed MLD to Right LE, Supraclavicular, 5 Breaths, Right axillary and Inguinal LN's, right lateral leg, medial to lateral and lateral leg, retracing pathways then lower leg retracing steps, and then foot and ankle retracing all steps and ending with LN's. Pt was wrapped by therapist applied TG soft, toe wrap 2 inelastomull toes 1-5, artiflex foot to knee, 8 cm wrap foot to ankle with figure 8/s, 10 cm wrap with 2 heel locks and then up leg., last 10 cm  lower calf to knee with TG soft pulled over it.   Due to swelling at Distal thigh decided to try large TG soft from below knee to groin folded at top as therapist does not feel a wrap here would stay   08/11/2022 Pts wraps removed and leg washed. Applied cocoa butter to right leg. Marland Kitchen Performed MLD to Right LE, supraclavicular, 5 breaths, right axillary and inguinal LN's, right lateral leg, medial to lateral, and lateral leg retracing pathways, then lower leg retracing all steps and then foot and ankle retracing all steps and ending with LN's. Pt was wrapped by therapist:Therapist applied TG soft, Toe wrap toes 1-5 with 2 in elastomull,  Artiflex foot to knee,8 cm wrap to foot and ankle with figure 8, 10 cm wrap for 2  heel locks and then up leg, and then 10 cm wrap mid le to knee with TG soft pulled over it. Used new wraps today and pt will wash wraps that were removed today. Pt gave approval to send demographics to [facility]   08/08/2022 Pts wraps removed and leg washed. Applied cocoa butter to right leg. Looked at her shoe that she had cut the front from but it has a velcro strap and it did fit over her wraps so she work well for her. Performed MLD to Right LE, supraclavicular, 5 breaths, right axillary and inguinal LN's, right lateral leg, medial to lateral, and lateral leg retracing pathways, then lower leg retracing all steps and then  foot and ankle retracing all steps and ending with LN's. Pt was wrapped by therapist:Therapist applied TG soft, Toe wrap toes 1-4, Artiflex ankle to knee,8 cm wrap to foot and ankle with figure 8, 10 cm wrap for 2 heel locks and then up leg, and then 10 cm wrap mid le to knee with TG soft pulled over it. Reminded pt how to exercise leg to loosen wraps and to remove if it gets uncomfortable. I showed pt the Flexitouch and she is not interested in having her benefits checked.  07/21/2022 Discussed treatment including compression bandaging, MLD, Remedial exercise, and different forms of compression garments. Pt might benefit from Velcro wraps instead of normal compression garments. Discussed # of visits per week and length of stay, need for different footwear ie cast shoes which pt has, and possibly having someone we can train to wrap so she can shower/bathe  PATIENT EDUCATION:  Education details: Treatment plan, types of compression, footwear, Person educated: Patient and Child(ren),son Chartered loss adjuster method: Explanation Education comprehension: verbalized understanding   HOME EXERCISE PROGRAM: NA  ASSESSMENT:  CLINICAL IMPRESSION: Measured Pts right LE with good reduction noted.Continued MLD and compression bandaging to right LE . Pt will bring her new garments when they  arrive. OBJECTIVE IMPAIRMENTS decreased activity tolerance, difficulty walking, and increased edema.   ACTIVITY LIMITATIONS carrying, standing, stairs, bed mobility, and locomotion levelactivities requiring standing, stairs, extended walking  PARTICIPATION LIMITATIONS:  as above  PERSONAL FACTORS Age, Time since onset of injury/illness/exacerbation, and 1-2 comorbidities: Lymphoma, prioer DVT/PE  are also affecting patient's functional outcome.   REHAB POTENTIAL: Excellent  CLINICAL DECISION MAKING: Stable/uncomplicated  EVALUATION COMPLEXITY: Low  GOALS: Goals reviewed with patient? Yes  SHORT TERM GOALS: Target date: 09/12/2022    Pt will have decreased edema at 10 cm prox to floor by atleast 2.5 cm Baseline LEFT 29.4, RIGHT 34.3 Goal status:right 08/08/2022  2.  Pt will have decreased edema at 30 cm prox to floor by 3 cm Baseline: Left 54.6, Right 48.5 Goal status: INITIAL  3.  Pts. Family member will be independent in compression bandaging so pt may bathe at home. Baseline: dependent Goal status: INITIAL  LONG TERM GOALS: Target date: 10/03/2022    Pt will have decreased edema at 10 cm prox to floor by 4 cm Baseline: left 37.4, Right 34/3 Goal status: INITIAL  2.  Pt will have edema reduction at 30 cm prox to floor by 5 cm Baseline: Left 54.6, right 48.5 Goal status: INITIAL  3.  Pt will be fit with appropriate compression garments;likely velcro wrap, and will be independent in their use Baseline:  Goal status: INITIAL  4.  Pt will have flexitouch demo to determine her desire for having the pump to assist with swelling Baseline:  Goal status: INITIAL   PLAN: PT FREQUENCY: 3x/week  PT DURATION: 6 weeks  PLANNED INTERVENTIONS: Therapeutic exercises, Patient/Family education, Self Care, Orthotic/Fit training, Manual lymph drainage, Compression bandaging, Vasopneumatic device, Manual therapy, and Re-evaluation  PLAN FOR NEXT SESSION: Measure pt Pt NOT  interested in Flexitouch, continue MLD and wrapping, wants to just wrap right leg first to be sure she tolerates OK .wants to  just do 1 leg until it is done and then the other.  Start left LE when right LE wraps arrive   Claris Pong, PT 08/22/2022, 12:17 PM

## 2022-08-25 ENCOUNTER — Ambulatory Visit: Payer: Medicare Other | Attending: Internal Medicine

## 2022-08-25 DIAGNOSIS — I890 Lymphedema, not elsewhere classified: Secondary | ICD-10-CM | POA: Diagnosis not present

## 2022-08-25 DIAGNOSIS — C8330 Diffuse large B-cell lymphoma, unspecified site: Secondary | ICD-10-CM | POA: Insufficient documentation

## 2022-08-25 NOTE — Therapy (Signed)
OUTPATIENT PHYSICAL THERAPY ONCOLOGY TREATMENT  Patient Name: Lauren Santana MRN: [id number] DOB:1951/07/27, 71 y.o., female Today's Date: 08/25/2022 Progress Note Reporting Period 07/21/2022 to 08/25/2022  See note below for Objective Data and Assessment of Progress/Goals.      PT End of Session - 08/25/22 1209     Visit Number 10    Number of Visits 18    Date for PT Re-Evaluation 09/01/22    PT Start Time 1209    PT Stop Time 1700    PT Time Calculation (min) 47 min    Activity Tolerance Patient tolerated treatment well    Behavior During Therapy WFL for tasks assessed/performed              Past Medical History:  Diagnosis Date   Arthritis    Edema    B/LLE   Endometriosis    High cholesterol    Hypertension    Numbness    feet   Pre-diabetes    Pulmonary embolism (HCC)    recurrent, second episode in 06/2020   Walker as ambulation aid    Wears dentures    Wears glasses    Past Surgical History:  Procedure Laterality Date   ABDOMINAL HYSTERECTOMY     and BSO   CATARACT EXTRACTION Left 10/2018   CESAREAN SECTION  1969, 1971   COLONOSCOPY     EYE SURGERY Bilateral    CATARACT SX   HERNIA REPAIR  2001   incisional   IR CATHETER TUBE CHANGE  10/12/2020   IR CATHETER TUBE CHANGE  12/07/2020   IR CHOLANGIOGRAM EXISTING TUBE  09/28/2020   IR PERC CHOLECYSTOSTOMY  08/17/2020   IR RADIOLOGIST EVAL & MGMT  11/22/2020   JOINT REPLACEMENT Left    Knee   MAXILLARY ANTROSTOMY Right 01/20/2020   Procedure: MAXILLARY ANTROSTOMY  WITH BIOPSY CALDWELL APPROACH;  Surgeon: Lauren Belfast, MD;  Location: [place];  Service: ENT;  Laterality: Right;   MULTIPLE TOOTH EXTRACTIONS  2011   NASAL ENDOSCOPY Right 02/17/2020   Procedure: NASAL ENDOSCOPY WITH RIGHT INFERIOR TURNBINATE REDUCTION;  Surgeon: Lauren Belfast, MD;  Location: [facility] OR;  Service: ENT;  Laterality: Right;   neck mass removal     "fatty tissue, not cancerous" per pt's son   SINUS ENDO WITH FUSION Right  01/20/2020   Procedure: SINUS ENDO WITH FUSION WITH BIOSPY;  Surgeon: Lauren Belfast, MD;  Location: [place];  Service: ENT;  Laterality: Right;   TOTAL KNEE ARTHROPLASTY Left 07/17/2014   Procedure: TOTAL KNEE ARTHROPLASTY;  Surgeon: Lauren Salen, MD;  Location: [place];  Service: Orthopedics;  Laterality: Left;   Patient Active Problem List   Diagnosis Date Noted   Anticoagulated 12/11/2020   Cholecystitis, acute 08/16/2020   Abdominal pain 08/16/2020   Severe sepsis ([place]) 08/16/2020   Pressure injury of skin 08/16/2020   Atrial fibrillation ([place]) 07/24/2020   Thrombocytopenia ([place]) 07/24/2020   Anemia of chronic disease 07/24/2020   Chronic pain 07/24/2020   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[place]) 07/24/2020   Pulmonary embolism ([place]) 07/19/2020   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   S/P BSO (bilateral salpingo-oophorectomy) 01/08/2017   S/P hysterectomy 01/08/2017   Tachycardia    Acute deep vein thrombosis (DVT) of right lower extremity ([place]) 02/14/2016   Pulmonary emboli ([place]) 02/13/2016   PE (pulmonary embolism) 02/13/2016   AKI (acute kidney injury) ([place]) 02/13/2016   Pulmonary embolism with acute cor pulmonale (HCC)    Pelvic mass in  female    Obesity (BMI 30-39.9) 08/20/2015   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   Essential hypertension, benign 07/23/2014   Edema 07/23/2014   Dyslipidemia 07/23/2014   Lower extremity numbness 07/23/2014   Arthritis of knee 07/17/2014   Postmenopausal bleeding 03/10/2012   Endometriosis of pelvis 01/22/2007    PCP: Lauren Shipper, MD  REFERRING PROVIDER: [name], MD  REFERRING DIAG: Diffuse large B cell Lymphoma  THERAPY DIAG:  Lymphedema  Lymphoma, large-cell, diffuse ([place])  ONSET DATE: 2 years ago  Rationale for Evaluation and Treatment Rehabilitation  SUBJECTIVE                                                                                                                                                                                            SUBJECTIVE STATEMENT: My wraps did not come. That was something from [facility]. I had a bad weekend. I couldn't even get up from my couch, and no one came by to see me.  PERTINENT HISTORY:  The patient is a 71 year old woman with a history of DLBCL.She has completed 6 cycles of R-mini CHOP with cycle 6-day 1 being 10/11/2020.  She has chronic Nausea/vomiting but she takes some liquid Sulcrafate and this has helped.  At her office visit  in April with MD she had no clinical signs consistent with disease reoccurence.  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  She has taken furosemide for 2 years with no improvement. She has compression stockings but she has trouble getting them on and does not wear them. She is here with her son Lauren Santana.(Friend of Dr. Ron Santana)  PAIN:  Are you having pain? Yesfrom neuropathy NPRS scale: unable to rate/10 Pain location: bilateral feet. Pain orientation: Bilateral  PAIN TYPE: nervy Pain description: constant  Aggravating factors: nothing Relieving factors: nothing  PRECAUTIONS: Other: B cell lymphoma, DM  WEIGHT BEARING RESTRICTIONS No  FALLS:  Has patient fallen in last 6 months? no  LIVING ENVIRONMENT: Lives with: Lives Alone Lives in: House/apartment Stairs: No;  Has following equipment at home: Environmental consultant - 4 wheeled, Wheelchair (manual), shower chair, Shower bench, and bed side commode  OCCUPATION: retired Aeronautical engineer  LEISURE: reading, prayer, encourager to family and friends  HAND DOMINANCE : right   PRIOR LEVEL OF FUNCTION: Independent with household mobility with device, even does her own sweeping and mopping  PATIENT GOALS Decrease leg swelling   OBJECTIVE  COGNITION:  Overall cognitive status: Within functional limits for tasks assessed   PALPATION: Fibrosis noted especially right calf region,  mild  pitting without tenderness at bilateral lower legs  OBSERVATIONS / OTHER ASSESSMENTS: Right leg with increased swelling greater than left, ankles very swollen above her slipper but reduced at foot.  SENSATION:  Light touch: Appears intact    POSTURE: forward head, rounded shoulders    UPPER EXTREMITY STRENGTH: NT   LOWER EXTREMITY MMT: NT  LYMPHEDEMA ASSESSMENTS:   SURGERY TYPE/DATE: NA  NUMBER OF LYMPH NODES REMOVED: NA  CHEMOTHERAPY: Yes  RADIATION:no  HORMONE TREATMENT: no  INFECTIONS: NA  LYMPHEDEMA ASSESSMENTS:     LOWER EXTREMITY LANDMARK RIGHT eval 08/08/2022 08/13/2022 08/22/2022  At groin      30 cm proximal to suprapatella      20 cm proximal to suprapatella      10 cm proximal to suprapatella      At midpatella / popliteal crease 47.3     30 cm proximal to floor at lateral plantar foot 54.6 49.5 50 47.5  20 cm proximal to floor at lateral plantar foot 39.9 37.1 36.8 38.3  10 cm proximal to floor at lateral plantar foot 34.3 27.7 26.3 25.8  Circumference of ankle/heel      5 cm proximal to 1st MTP joint 27.0   23.2  Across MTP joint 24.3     Around proximal great toe 9.4   8.0  (Blank rows = not tested)  LOWER EXTREMITY LANDMARK LEFT eval  At groin   30 cm proximal to suprapatella   20 cm proximal to suprapatella   10 cm proximal to suprapatella   At midpatella / popliteal crease 43.4  30 cm proximal to floor at lateral plantar foot 48.5  20 cm proximal to floor at lateral plantar foot 37.4  10 cm proximal to floor at lateral plantar foot 29.4  Circumference of ankle/heel   5 cm proximal to 1st MTP joint 24.5  Across MTP joint 23.4  Around proximal great toe 8.2  (Blank rows = not tested)    GAIT: Distance walked: to room and back to front office after rx Assistive device utilized: Environmental consultant - 4 wheeled Level of assistance: Modified independence Comments: flexed posture, decreased heel strike toe off     TODAY'S TREATMENT   08/25/2022  Pts wraps removed and leg washed. Pts leg assited onto table before and off table after treatment Performed MLD to Right LE, Supraclavicular, 5 Breaths, Right axillary and Inguinal LN's, right lateral leg, medial to lateral and lateral leg, retracing pathways then lower leg retracing steps, and then foot and ankle retracing all steps and ending with LN's. Compression bandages applied: cocoa butter to entire RUE, applied TG soft, elastomull to digits 1-5, artiflex foot to knee, 8 cm wrap foot to ankle with figure 8/s, 10 cm wrap with 2 heel locks and then up leg., last 10 cm from ankle to knee.  Large TG soft from below knee to groin folded at top .   08/21/2022  Pts wraps removed and leg washed. Pt measured Performed MLD to Right LE, Supraclavicular, 5 Breaths, Right axillary and Inguinal LN's, right lateral leg, medial to lateral and lateral leg, retracing pathways then lower leg retracing steps, and then foot and ankle retracing all steps and ending with LN's. Compression bandages applied: cocoa butter to entire RUE, applied TG soft, elastomull to digits 1-5, artiflex foot to knee, 8 cm wrap foot to ankle with figure 8/s, 10 cm wrap with 2 heel locks and then up leg., last 10 cm from ankle to knee.  Large TG  soft from below knee to groin folded at top .  08/20/2022  Pts wraps removed and leg washed.  Performed MLD to Right LE, Supraclavicular, 5 Breaths, Right axillary and Inguinal LN's, right lateral leg, medial to lateral and lateral leg, retracing pathways then lower leg retracing steps, and then foot and ankle retracing all steps and ending with LN's. Compression bandages applied: cocoa butter to entire RUE, applied TG soft, elastomull to digits 1-5, artiflex foot to knee, 8 cm wrap foot to ankle with figure 8/s, 10 cm wrap with 2 heel locks and then up leg., last 10 cm from ankle to knee.  Large TG soft from below knee to groin folded at top . Ordered compression garments for pt  using her Credit Card: XL MAX Juzo compression wrap with slip on feature and Juzo compression wrap Foot size Medium. Estimated shipping time 4-8 days. Pt has confirmation   08/18/2022 Pts wraps removed and leg washed.  Pt measured for Juzo Compression wrap with slip on Feature and Juzo compression wrap foot. Awaiting contact with Lauen at [facility] to see if [employer] will pay secondary to Cancer rx at [facility]. Compression bandages applied: lotion to entire RUE, applied TG soft, elastomull to digits 1-5, artiflex foot to knee, 8 cm wrap foot to ankle with figure 8/s, 10 cm wrap with 2 heel locks and then up leg., last 10 cm from ankle to knee.  Large TG soft from below knee to groin folded at top as therapist does not feel a wrap here would stay . Also tried a [employer] grip wrap at knee and distal thigh but it did not conform well.  08/13/2022 Pts wraps removed and leg washed.  Performed MLD to Right LE, Supraclavicular, 5 Breaths, Right axillary and Inguinal LN's, right lateral leg, medial to lateral and lateral leg, retracing pathways then lower leg retracing steps, and then foot and ankle retracing all steps and ending with LN's. Compression bandages applied: lotion to entire RUE, applied TG soft, elastomull to digits 1-5, artiflex foot to knee, 8 cm wrap foot to ankle with figure 8/s, 10 cm wrap with 2 heel locks and then up leg., last 10 cm from ankle to knee.  Large TG soft from below knee to groin folded at top as therapist does not feel a wrap here would stay   08/13/2022 Pts wraps removed and leg washed. Pt remeasured. Applied cocoa butter to right leg Performed MLD to Right LE, Supraclavicular, 5 Breaths, Right axillary and Inguinal LN's, right lateral leg, medial to lateral and lateral leg, retracing pathways then lower leg retracing steps, and then foot and ankle retracing all steps and ending with LN's. Pt was wrapped by therapist applied TG soft, toe wrap 2 inelastomull toes 1-5,  artiflex foot to knee, 8 cm wrap foot to ankle with figure 8/s, 10 cm wrap with 2 heel locks and then up leg., last 10 cm  lower calf to knee with TG soft pulled over it.   Due to swelling at Distal thigh decided to try large TG soft from below knee to groin folded at top as therapist does not feel a wrap here would stay   08/11/2022 Pts wraps removed and leg washed. Applied cocoa butter to right leg. Marland Kitchen Performed MLD to Right LE, supraclavicular, 5 breaths, right axillary and inguinal LN's, right lateral leg, medial to lateral, and lateral leg retracing pathways, then lower leg retracing all steps and then foot and ankle retracing all steps and ending  with LN's. Pt was wrapped by therapist:Therapist applied TG soft, Toe wrap toes 1-5 with 2 in elastomull,  Artiflex foot to knee,8 cm wrap to foot and ankle with figure 8, 10 cm wrap for 2 heel locks and then up leg, and then 10 cm wrap mid le to knee with TG soft pulled over it. Used new wraps today and pt will wash wraps that were removed today. Pt gave approval to send demographics to [facility]   08/08/2022 Pts wraps removed and leg washed. Applied cocoa butter to right leg. Looked at her shoe that she had cut the front from but it has a velcro strap and it did fit over her wraps so she work well for her. Performed MLD to Right LE, supraclavicular, 5 breaths, right axillary and inguinal LN's, right lateral leg, medial to lateral, and lateral leg retracing pathways, then lower leg retracing all steps and then foot and ankle retracing all steps and ending with LN's. Pt was wrapped by therapist:Therapist applied TG soft, Toe wrap toes 1-4, Artiflex ankle to knee,8 cm wrap to foot and ankle with figure 8, 10 cm wrap for 2 heel locks and then up leg, and then 10 cm wrap mid le to knee with TG soft pulled over it. Reminded pt how to exercise leg to loosen wraps and to remove if it gets uncomfortable. I showed pt the Flexitouch and she is not interested in having  her benefits checked.  07/21/2022 Discussed treatment including compression bandaging, MLD, Remedial exercise, and different forms of compression garments. Pt might benefit from Velcro wraps instead of normal compression garments. Discussed # of visits per week and length of stay, need for different footwear ie cast shoes which pt has, and possibly having someone we can train to wrap so she can shower/bathe  PATIENT EDUCATION:  Education details: Treatment plan, types of compression, footwear, Person educated: Patient and Child(ren),son Chartered loss adjuster method: Explanation Education comprehension: verbalized understanding   HOME EXERCISE PROGRAM: NA  ASSESSMENT:  CLINICAL IMPRESSION:  Continued with manual lymphatic drainage and compression bandaging as previously while awaiting pt garments that were ordered last Wednesday and will then begin left LE bandaging. Pt anxious to take a shower   OBJECTIVE IMPAIRMENTS decreased activity tolerance, difficulty walking, and increased edema.   ACTIVITY LIMITATIONS carrying, standing, stairs, bed mobility, and locomotion levelactivities requiring standing, stairs, extended walking  PARTICIPATION LIMITATIONS:  as above  PERSONAL FACTORS Age, Time since onset of injury/illness/exacerbation, and 1-2 comorbidities: Lymphoma, prioer DVT/PE  are also affecting patient's functional outcome.   REHAB POTENTIAL: Excellent  CLINICAL DECISION MAKING: Stable/uncomplicated  EVALUATION COMPLEXITY: Low  GOALS: Goals reviewed with patient? Yes  SHORT TERM GOALS: Target date: 09/15/2022    Pt will have decreased edema at 10 cm prox to floor by atleast 2.5 cm Baseline LEFT 29.4, RIGHT 34.3 Goal status: MET right 08/08/2022  2.  Pt will have decreased edema at 30 cm prox to floor by 3 cm Baseline: Left 54.6, Right 48.5 Goal status: MET right 08/13/2022  3.  Pts. Family member will be independent in compression bandaging so pt may bathe at  home. Baseline: dependent Goal status: Instructed but has not performed  LONG TERM GOALS: Target date: 10/06/2022    Pt will have decreased edema at 10 cm prox to floor by 4 cm Baseline: left 37.4, Right 34/3 Goal status: MET right 08/22/2022  2.  Pt will have edema reduction at 30 cm prox to floor by 5 cm Baseline: Left 54.6,  right 48.5 Goal status: MET Right 08/22/2022  3.  Pt will be fit with appropriate compression garments;likely velcro wrap, and will be independent in their use Baseline:  Goal status: MET for right but still awaiting garments  4.  Pt will have flexitouch demo to determine her desire for having the pump to assist with swelling Baseline:  Goal status: Deferred. Pt not interested   PLAN: PT FREQUENCY: 3x/week  PT DURATION: 6 weeks  PLANNED INTERVENTIONS: Therapeutic exercises, Patient/Family education, Self Care, Orthotic/Fit training, Manual lymph drainage, Compression bandaging, Vasopneumatic device, Manual therapy, and Re-evaluation  PLAN FOR NEXT SESSION: Measure pt Pt NOT interested in Flexitouch, continue MLD and wrapping, wants to just wrap right leg first to be sure she tolerates OK .wants to  just do 1 leg until it is done and then the other.  Start left LE when right LE wraps arrive   Claris Pong, PT 08/25/2022, 1:02 PM

## 2022-08-27 ENCOUNTER — Ambulatory Visit: Payer: Medicare Other

## 2022-08-27 DIAGNOSIS — C8330 Diffuse large B-cell lymphoma, unspecified site: Secondary | ICD-10-CM | POA: Diagnosis not present

## 2022-08-27 DIAGNOSIS — I890 Lymphedema, not elsewhere classified: Secondary | ICD-10-CM | POA: Diagnosis not present

## 2022-08-27 NOTE — Therapy (Signed)
OUTPATIENT PHYSICAL THERAPY ONCOLOGY TREATMENT  Patient Name: JAIDAN STACHNIK MRN: [id number] DOB:1951/10/11, 71 y.o., female Today's Date: 08/27/2022 Progress Note Reporting Period 07/21/2022 to 08/25/2022  See note below for Objective Data and Assessment of Progress/Goals.      PT End of Session - 08/27/22 1204     Visit Number 11    Number of Visits 18    Date for PT Re-Evaluation 09/01/22    PT Start Time 1204    PT Stop Time 1304    PT Time Calculation (min) 60 min    Activity Tolerance Patient tolerated treatment well    Behavior During Therapy WFL for tasks assessed/performed              Past Medical History:  Diagnosis Date   Arthritis    Edema    B/LLE   Endometriosis    High cholesterol    Hypertension    Numbness    feet   Pre-diabetes    Pulmonary embolism (HCC)    recurrent, second episode in 06/2020   Walker as ambulation aid    Wears dentures    Wears glasses    Past Surgical History:  Procedure Laterality Date   ABDOMINAL HYSTERECTOMY     and BSO   CATARACT EXTRACTION Left 10/2018   CESAREAN SECTION  1969, 1971   COLONOSCOPY     EYE SURGERY Bilateral    CATARACT SX   HERNIA REPAIR  2001   incisional   IR CATHETER TUBE CHANGE  10/12/2020   IR CATHETER TUBE CHANGE  12/07/2020   IR CHOLANGIOGRAM EXISTING TUBE  09/28/2020   IR PERC CHOLECYSTOSTOMY  08/17/2020   IR RADIOLOGIST EVAL & MGMT  11/22/2020   JOINT REPLACEMENT Left    Knee   MAXILLARY ANTROSTOMY Right 01/20/2020   Procedure: MAXILLARY ANTROSTOMY  WITH BIOPSY CALDWELL APPROACH;  Surgeon: Jerrell Belfast, MD;  Location: [facility];  Service: ENT;  Laterality: Right;   MULTIPLE TOOTH EXTRACTIONS  2011   NASAL ENDOSCOPY Right 02/17/2020   Procedure: NASAL ENDOSCOPY WITH RIGHT INFERIOR TURNBINATE REDUCTION;  Surgeon: Jerrell Belfast, MD;  Location: [facility] OR;  Service: ENT;  Laterality: Right;   neck mass removal     "fatty tissue, not cancerous" per pt's son   SINUS ENDO WITH FUSION Right  01/20/2020   Procedure: SINUS ENDO WITH FUSION WITH BIOSPY;  Surgeon: Jerrell Belfast, MD;  Location: [place];  Service: ENT;  Laterality: Right;   TOTAL KNEE ARTHROPLASTY Left 07/17/2014   Procedure: TOTAL KNEE ARTHROPLASTY;  Surgeon: Kerin Salen, MD;  Location: [place];  Service: Orthopedics;  Laterality: Left;   Patient Active Problem List   Diagnosis Date Noted   Anticoagulated 12/11/2020   Cholecystitis, acute 08/16/2020   Abdominal pain 08/16/2020   Severe sepsis ([place]) 08/16/2020   Pressure injury of skin 08/16/2020   Atrial fibrillation ([place]) 07/24/2020   Thrombocytopenia ([place]) 07/24/2020   Anemia of chronic disease 07/24/2020   Chronic pain 07/24/2020   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[place]) 07/24/2020   Pulmonary embolism ([place]) 07/19/2020   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   S/P BSO (bilateral salpingo-oophorectomy) 01/08/2017   S/P hysterectomy 01/08/2017   Tachycardia    Acute deep vein thrombosis (DVT) of right lower extremity ([place]) 02/14/2016   Pulmonary emboli ([place]) 02/13/2016   PE (pulmonary embolism) 02/13/2016   AKI (acute kidney injury) ([place]) 02/13/2016   Pulmonary embolism with acute cor pulmonale (HCC)    Pelvic mass in  female    Obesity (BMI 30-39.9) 08/20/2015   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   Essential hypertension, benign 07/23/2014   Edema 07/23/2014   Dyslipidemia 07/23/2014   Lower extremity numbness 07/23/2014   Arthritis of knee 07/17/2014   Postmenopausal bleeding 03/10/2012   Endometriosis of pelvis 01/22/2007    PCP: Beatrix Shipper, MD  REFERRING PROVIDER: [name], MD  REFERRING DIAG: Diffuse large B cell Lymphoma  THERAPY DIAG:  Lymphedema  Lymphoma, large-cell, diffuse ([place])  ONSET DATE: 2 years ago  Rationale for Evaluation and Treatment Rehabilitation  SUBJECTIVE                                                                                                                                                                                            SUBJECTIVE STATEMENT: My wraps came yesterday. I had some pain over the last couple of days all over my body. Today my left lateral leg hurts. I didn't take my  pain meds today.  PERTINENT HISTORY:  The patient is a 71 year old woman with a history of DLBCL.She has completed 6 cycles of R-mini CHOP with cycle 6-day 1 being 10/11/2020.  She has chronic Nausea/vomiting but she takes some liquid Sulcrafate and this has helped.  At her office visit  in April with MD she had no clinical signs consistent with disease reoccurence.  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  She has taken furosemide for 2 years with no improvement. She has compression stockings but she has trouble getting them on and does not wear them. She is here with her son [name].(Friend of Dr. Ron Agee)  PAIN:  Are you having pain? Yesfrom neuropathy NPRS scale: unable to rate/10 Pain location: bilateral feet. Pain orientation: Bilateral  PAIN TYPE: nervy Pain description: constant  Aggravating factors: nothing Relieving factors: nothing  PRECAUTIONS: Other: B cell lymphoma, DM  WEIGHT BEARING RESTRICTIONS No  FALLS:  Has patient fallen in last 6 months? no  LIVING ENVIRONMENT: Lives with: Lives Alone Lives in: House/apartment Stairs: No;  Has following equipment at home: Environmental consultant - 4 wheeled, Wheelchair (manual), shower chair, Shower bench, and bed side commode  OCCUPATION: retired Aeronautical engineer  LEISURE: reading, prayer, encourager to family and friends  HAND DOMINANCE : right   PRIOR LEVEL OF FUNCTION: Independent with household mobility with device, even does her own sweeping and mopping  PATIENT GOALS Decrease leg swelling   OBJECTIVE  COGNITION:  Overall cognitive status: Within functional limits for tasks assessed   PALPATION: Fibrosis noted especially right calf  region, mild pitting without tenderness at bilateral lower legs  OBSERVATIONS / OTHER ASSESSMENTS: Right leg with increased swelling greater than left, ankles very swollen above her slipper but reduced at foot.  SENSATION:  Light touch: Appears intact    POSTURE: forward head, rounded shoulders    UPPER EXTREMITY STRENGTH: NT   LOWER EXTREMITY MMT: NT  LYMPHEDEMA ASSESSMENTS:   SURGERY TYPE/DATE: NA  NUMBER OF LYMPH NODES REMOVED: NA  CHEMOTHERAPY: Yes  RADIATION:no  HORMONE TREATMENT: no  INFECTIONS: NA  LYMPHEDEMA ASSESSMENTS:     LOWER EXTREMITY LANDMARK RIGHT eval 08/08/2022 08/13/2022 08/22/2022  At groin      30 cm proximal to suprapatella      20 cm proximal to suprapatella      10 cm proximal to suprapatella      At midpatella / popliteal crease 47.3     30 cm proximal to floor at lateral plantar foot 54.6 49.5 50 47.5  20 cm proximal to floor at lateral plantar foot 39.9 37.1 36.8 38.3  10 cm proximal to floor at lateral plantar foot 34.3 27.7 26.3 25.8  Circumference of ankle/heel      5 cm proximal to 1st MTP joint 27.0   23.2  Across MTP joint 24.3     Around proximal great toe 9.4   8.0  (Blank rows = not tested)  LOWER EXTREMITY LANDMARK LEFT eval  At groin   30 cm proximal to suprapatella   20 cm proximal to suprapatella   10 cm proximal to suprapatella   At midpatella / popliteal crease 43.4  30 cm proximal to floor at lateral plantar foot 48.5  20 cm proximal to floor at lateral plantar foot 37.4  10 cm proximal to floor at lateral plantar foot 29.4  Circumference of ankle/heel   5 cm proximal to 1st MTP joint 24.5  Across MTP joint 23.4  Around proximal great toe 8.2  (Blank rows = not tested)    GAIT: Distance walked: to room and back to front office after rx Assistive device utilized: Environmental consultant - 4 wheeled Level of assistance: Modified independence Comments: flexed posture, decreased heel strike toe off     TODAY'S TREATMENT   08/27/2022 Pts wraps removed and leg washed Velcro wraps removed from boxes and velcro  tabs applied for pt to wear black side out at her request. Pt was given the mildly compressive undersock to apply with a little Assist of PT which she did with her leg outstretched on the treatment table. She was instructed in proper way to don the velcro wrap over the sock and she did so the first time with some assist and the next time alone. She was able to don the wrap with liner by herself with some difficulty mainly because of trouble reaching the lower leg. The foot piece was also applied first by PT and next by pt with occasional assist. Before leaving pts. son arrived and I explained to him and showed him the top straps and how they are pulled to get good pressure. They are a bit of a challege for pt because of difficulty reaching her foot and lower leg strap. 2 of the straps are long and reach behind her leg, however the garment in a smaller size will probably cause the liner to be too tight for pt. We will let pt wear the garment and remove prn and she will have her son and grand daughter come on Friday to practice also. Pt wants to  wait until next week to wrap her left leg.  08/25/2022  Pts wraps removed and leg washed. Pts leg assited onto table before and off table after treatment Performed MLD to Right LE, Supraclavicular, 5 Breaths, Right axillary and Inguinal LN's, right lateral leg, medial to lateral and lateral leg, retracing pathways then lower leg retracing steps, and then foot and ankle retracing all steps and ending with LN's. Compression bandages applied: cocoa butter to entire RUE, applied TG soft, elastomull to digits 1-5, artiflex foot to knee, 8 cm wrap foot to ankle with figure 8/s, 10 cm wrap with 2 heel locks and then up leg., last 10 cm from ankle to knee.  Large TG soft from below knee to groin folded at top .   08/21/2022  Pts wraps removed and leg washed. Pt measured Performed MLD  to Right LE, Supraclavicular, 5 Breaths, Right axillary and Inguinal LN's, right lateral leg, medial to lateral and lateral leg, retracing pathways then lower leg retracing steps, and then foot and ankle retracing all steps and ending with LN's. Compression bandages applied: cocoa butter to entire RUE, applied TG soft, elastomull to digits 1-5, artiflex foot to knee, 8 cm wrap foot to ankle with figure 8/s, 10 cm wrap with 2 heel locks and then up leg., last 10 cm from ankle to knee.  Large TG soft from below knee to groin folded at top .  08/20/2022  Pts wraps removed and leg washed.  Performed MLD to Right LE, Supraclavicular, 5 Breaths, Right axillary and Inguinal LN's, right lateral leg, medial to lateral and lateral leg, retracing pathways then lower leg retracing steps, and then foot and ankle retracing all steps and ending with LN's. Compression bandages applied: cocoa butter to entire RUE, applied TG soft, elastomull to digits 1-5, artiflex foot to knee, 8 cm wrap foot to ankle with figure 8/s, 10 cm wrap with 2 heel locks and then up leg., last 10 cm from ankle to knee.  Large TG soft from below knee to groin folded at top . Ordered compression garments for pt using her Credit Card: XL MAX Juzo compression wrap with slip on feature and Juzo compression wrap Foot size Medium. Estimated shipping time 4-8 days. Pt has confirmation   08/18/2022 Pts wraps removed and leg washed.  Pt measured for Juzo Compression wrap with slip on Feature and Juzo compression wrap foot. Awaiting contact with Lauen at [facility] to see if [employer] will pay secondary to Cancer rx at [facility]. Compression bandages applied: lotion to entire RUE, applied TG soft, elastomull to digits 1-5, artiflex foot to knee, 8 cm wrap foot to ankle with figure 8/s, 10 cm wrap with 2 heel locks and then up leg., last 10 cm from ankle to knee.  Large TG soft from below knee to groin folded at top as therapist does not feel a wrap here  would stay . Also tried a Financial trader grip wrap at knee and distal thigh but it did not conform well.    PATIENT EDUCATION:  Education details: Treatment plan, types of compression, footwear, Person educated: Patient and Child(ren),son Chartered loss adjuster method: Explanation Education comprehension: verbalized understanding   HOME EXERCISE PROGRAM: NA  ASSESSMENT:  CLINICAL IMPRESSION: Spent today setting up [place] wrap for right LE and practicing donning with pt. She did require PT assist but was able to do much better after practicing a time or 2. Greatest difficulty was the lower leg and under sock because of difficulty reaching. The  wraps felt comfortable for pt, and weren't as heavy as the compression bandages.  OBJECTIVE IMPAIRMENTS decreased activity tolerance, difficulty walking, and increased edema.   ACTIVITY LIMITATIONS carrying, standing, stairs, bed mobility, and locomotion levelactivities requiring standing, stairs, extended walking  PARTICIPATION LIMITATIONS:  as above  PERSONAL FACTORS Age, Time since onset of injury/illness/exacerbation, and 1-2 comorbidities: Lymphoma, prioer DVT/PE  are also affecting patient's functional outcome.   REHAB POTENTIAL: Excellent  CLINICAL DECISION MAKING: Stable/uncomplicated  EVALUATION COMPLEXITY: Low  GOALS: Goals reviewed with patient? Yes  SHORT TERM GOALS: Target date: 09/17/2022    Pt will have decreased edema at 10 cm prox to floor by atleast 2.5 cm Baseline LEFT 29.4, RIGHT 34.3 Goal status: MET right 08/08/2022  2.  Pt will have decreased edema at 30 cm prox to floor by 3 cm Baseline: Left 54.6, Right 48.5 Goal status: MET right 08/13/2022  3.  Pts. Family member will be independent in compression bandaging so pt may bathe at home. Baseline: dependent Goal status: Instructed but has not performed  LONG TERM GOALS: Target date: 10/08/2022    Pt will have decreased edema at 10 cm prox to floor by 4 cm Baseline:  left 37.4, Right 34/3 Goal status: MET right 08/22/2022  2.  Pt will have edema reduction at 30 cm prox to floor by 5 cm Baseline: Left 54.6, right 48.5 Goal status: MET Right 08/22/2022  3.  Pt will be fit with appropriate compression garments;likely velcro wrap, and will be independent in their use Baseline:  Goal status: MET for right but still awaiting garments  4.  Pt will have flexitouch demo to determine her desire for having the pump to assist with swelling Baseline:  Goal status: Deferred. Pt not interested   PLAN: PT FREQUENCY: 3x/week  PT DURATION: 6 weeks  PLANNED INTERVENTIONS: Therapeutic exercises, Patient/Family education, Self Care, Orthotic/Fit training, Manual lymph drainage, Compression bandaging, Vasopneumatic device, Manual therapy, and Re-evaluation  PLAN FOR NEXT SESSION: Measure pt Pt NOT interested in Flexitouch, continue MLD and wrapping, wants to just wrap right leg first to be sure she tolerates OK .wants to  just do 1 leg until it is done and then the other.  Start left LE when right LE wraps arrive   Claris Pong, PT 08/27/2022, 2:00 PM

## 2022-08-29 ENCOUNTER — Ambulatory Visit: Payer: Medicare Other

## 2022-08-29 DIAGNOSIS — I890 Lymphedema, not elsewhere classified: Secondary | ICD-10-CM | POA: Diagnosis not present

## 2022-08-29 DIAGNOSIS — C8330 Diffuse large B-cell lymphoma, unspecified site: Secondary | ICD-10-CM | POA: Diagnosis not present

## 2022-08-29 NOTE — Therapy (Signed)
OUTPATIENT PHYSICAL THERAPY ONCOLOGY TREATMENT  Patient Name: KAZIAH KRIZEK MRN: [id number] DOB:Jun 03, 1951, 71 y.o., female Today's Date: 08/29/2022     PT End of Session - 08/29/22 1103     Visit Number 12    Number of Visits 18    Date for PT Re-Evaluation 09/01/22    PT Start Time 1104    PT Stop Time 1147    PT Time Calculation (min) 43 min    Activity Tolerance Patient tolerated treatment well    Behavior During Therapy [facility] for tasks assessed/performed              Past Medical History:  Diagnosis Date   Arthritis    Edema    B/LLE   Endometriosis    High cholesterol    Hypertension    Numbness    feet   Pre-diabetes    Pulmonary embolism ([place])    recurrent, second episode in 06/2020   Walker as ambulation aid    Wears dentures    Wears glasses    Past Surgical History:  Procedure Laterality Date   ABDOMINAL HYSTERECTOMY     and BSO   CATARACT EXTRACTION Left 10/2018   CESAREAN SECTION  1969, 1971   COLONOSCOPY     EYE SURGERY Bilateral    CATARACT SX   HERNIA REPAIR  2001   incisional   IR CATHETER TUBE CHANGE  10/12/2020   IR CATHETER TUBE CHANGE  12/07/2020   IR CHOLANGIOGRAM EXISTING TUBE  09/28/2020   IR PERC CHOLECYSTOSTOMY  08/17/2020   IR RADIOLOGIST EVAL & MGMT  11/22/2020   JOINT REPLACEMENT Left    Knee   MAXILLARY ANTROSTOMY Right 01/20/2020   Procedure: MAXILLARY ANTROSTOMY  WITH BIOPSY CALDWELL APPROACH;  Surgeon: Jerrell Belfast, MD;  Location: [facility];  Service: ENT;  Laterality: Right;   MULTIPLE TOOTH EXTRACTIONS  2011   NASAL ENDOSCOPY Right 02/17/2020   Procedure: NASAL ENDOSCOPY WITH RIGHT INFERIOR TURNBINATE REDUCTION;  Surgeon: Jerrell Belfast, MD;  Location: [facility];  Service: ENT;  Laterality: Right;   neck mass removal     "fatty tissue, not cancerous" per pt's son   SINUS ENDO WITH FUSION Right 01/20/2020   Procedure: SINUS ENDO WITH FUSION WITH BIOSPY;  Surgeon: Jerrell Belfast, MD;  Location: [facility];  Service: ENT;   Laterality: Right;   TOTAL KNEE ARTHROPLASTY Left 07/17/2014   Procedure: TOTAL KNEE ARTHROPLASTY;  Surgeon: Kerin Salen, MD;  Location: [facility];  Service: Orthopedics;  Laterality: Left;   Patient Active Problem List   Diagnosis Date Noted   Anticoagulated 12/11/2020   Cholecystitis, acute 08/16/2020   Abdominal pain 08/16/2020   Severe sepsis ([place]) 08/16/2020   Pressure injury of skin 08/16/2020   Atrial fibrillation ([place]) 07/24/2020   Thrombocytopenia ([place]) 07/24/2020   Anemia of chronic disease 07/24/2020   Chronic pain 07/24/2020   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[place]) 07/24/2020   Pulmonary embolism ([place]) 07/19/2020   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   S/P BSO (bilateral salpingo-oophorectomy) 01/08/2017   S/P hysterectomy 01/08/2017   Tachycardia    Acute deep vein thrombosis (DVT) of right lower extremity ([place]) 02/14/2016   Pulmonary emboli ([place]) 02/13/2016   PE (pulmonary embolism) 02/13/2016   AKI (acute kidney injury) ([place]) 02/13/2016   Pulmonary embolism with acute cor pulmonale ([facility])    Pelvic mass in female    Obesity (BMI 30-39.9) 08/20/2015   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   Essential  hypertension, benign 07/23/2014   Edema 07/23/2014   Dyslipidemia 07/23/2014   Lower extremity numbness 07/23/2014   Arthritis of knee 07/17/2014   Postmenopausal bleeding 03/10/2012   Endometriosis of pelvis 01/22/2007    PCP: Beatrix Shipper, MD  REFERRING PROVIDER: [name], MD  REFERRING DIAG: Diffuse large B cell Lymphoma  THERAPY DIAG:  Lymphedema  Lymphoma, large-cell, diffuse ([place])  ONSET DATE: 2 years ago  Rationale for Evaluation and Treatment Rehabilitation  SUBJECTIVE                                                                                                                                                                                           SUBJECTIVE STATEMENT:  I wore the wraps Wednesday day and  night and Thursday but took them off in the evening to shower. [name] my grand daughter helped me get them on again. I am able to get them on by myself, but sometimes I have to take a little rest. My low couch works well, I brought [name] today to learn the correct way, but I don't want my left leg wrapped until next week.  PERTINENT HISTORY:  The patient is a 71 year old woman with a history of DLBCL.She has completed 6 cycles of R-mini CHOP with cycle 6-day 1 being 10/11/2020.  She has chronic Nausea/vomiting but she takes some liquid Sulcrafate and this has helped.  At her office visit  in April with MD she had no clinical signs consistent with disease reoccurence.  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  She has taken furosemide for 2 years with no improvement. She has compression stockings but she has trouble getting them on and does not wear them. She is here with her son [name].(Friend of Dr. Ron Agee)  PAIN:  Are you having pain? Yesfrom neuropathy NPRS scale: unable to rate/10 Pain location: bilateral feet. Pain orientation: Bilateral  PAIN TYPE: nervy Pain description: constant  Aggravating factors: nothing Relieving factors: nothing  PRECAUTIONS: Other: B cell lymphoma, DM  WEIGHT BEARING RESTRICTIONS No  FALLS:  Has patient fallen in last 6 months? no  LIVING ENVIRONMENT: Lives with: Lives Alone Lives in: House/apartment Stairs: No;  Has following equipment at home: Environmental consultant - 4 wheeled, Wheelchair (manual), shower chair, Shower bench, and bed side commode  OCCUPATION: retired Aeronautical engineer  LEISURE: reading, prayer, encourager to family and friends  HAND DOMINANCE : right   PRIOR LEVEL OF FUNCTION: Independent with household mobility with device, even does her own sweeping and mopping  PATIENT GOALS Decrease leg swelling  OBJECTIVE  COGNITION:  Overall cognitive status: Within  functional limits for tasks assessed   PALPATION: Fibrosis noted especially right calf region, mild pitting without tenderness at bilateral lower legs  OBSERVATIONS / OTHER ASSESSMENTS: Right leg with increased swelling greater than left, ankles very swollen above her slipper but reduced at foot.  SENSATION:  Light touch: Appears intact    POSTURE: forward head, rounded shoulders    UPPER EXTREMITY STRENGTH: NT   LOWER EXTREMITY MMT: NT  LYMPHEDEMA ASSESSMENTS:   SURGERY TYPE/DATE: NA  NUMBER OF LYMPH NODES REMOVED: NA  CHEMOTHERAPY: Yes  RADIATION:no  HORMONE TREATMENT: no  INFECTIONS: NA  LYMPHEDEMA ASSESSMENTS:     LOWER EXTREMITY LANDMARK RIGHT eval 08/08/2022 08/13/2022 08/22/2022 08/29/2022  At groin       30 cm proximal to suprapatella       20 cm proximal to suprapatella       10 cm proximal to suprapatella       At midpatella / popliteal crease 47.3      30 cm proximal to floor at lateral plantar foot 54.6 49.5 50 47.5 48  20 cm proximal to floor at lateral plantar foot 39.9 37.1 36.8 38.3 38.6  10 cm proximal to floor at lateral plantar foot 34.3 27.7 26.3 25.8 26.2  Circumference of ankle/heel       5 cm proximal to 1st MTP joint 27.0   23.2 23.9  Across MTP joint 24.3      Around proximal great toe 9.4   8.0 9.0  (Blank rows = not tested)  LOWER EXTREMITY LANDMARK LEFT eval  At groin   30 cm proximal to suprapatella   20 cm proximal to suprapatella   10 cm proximal to suprapatella   At midpatella / popliteal crease 43.4  30 cm proximal to floor at lateral plantar foot 48.5  20 cm proximal to floor at lateral plantar foot 37.4  10 cm proximal to floor at lateral plantar foot 29.4  Circumference of ankle/heel   5 cm proximal to 1st MTP joint 24.5  Across MTP joint 23.4  Around proximal great toe 8.2  (Blank rows = not tested)    GAIT: Distance walked: to room and back to front office after rx Assistive device utilized: Environmental consultant - 4  wheeled Level of assistance: Modified independence Comments: flexed posture, decreased heel strike toe off     TODAY'S TREATMENT   08/29/2022 PT arrived with her grand daughter Andy Gauss Pts velcro wraps were removed and they were shown how to anchor velcro to keep it out of the way  Pt was remeasured with only slight increases since wearing the wraps, but noted swelling at mid ankle. Pt was sitting with her leg on table and donned undersock independently. She then had her leg on the floor and was able to pull the Juzo wrap up and was able to anchor all straps with some difficulty with longer straps. She had a little more difficulty with the foot piece and getting it tight. Wraps were removed again and pts grand daughter applied wraps. Educated her to start at the bottom and showed her how to pull both straps for increased pressure. She did very well and has a good understanding of how to reverse the colors prn. We also discussed exs the pt should be doing and she admitted she has sitting and standing exs that she has not been doing and showed me all of them. She was advised to return to her exercises since  her legs are very weak.  08/27/2022 Pts wraps removed and leg washed Velcro wraps removed from boxes and velcro  tabs applied for pt to wear black side out at her request. Pt was given the mildly compressive undersock to apply with a little Assist of PT which she did with her leg outstretched on the treatment table. She was instructed in proper way to don the velcro wrap over the sock and she did so the first time with some assist and the next time alone. She was able to don the wrap with liner by herself with some difficulty mainly because of trouble reaching the lower leg. The foot piece was also applied first by PT and next by pt with occasional assist. Before leaving pts. son arrived and I explained to him and showed him the top straps and how they are pulled to get good pressure. They are a bit of  a challege for pt because of difficulty reaching her foot and lower leg strap. 2 of the straps are long and reach behind her leg, however the garment in a smaller size will probably cause the liner to be too tight for pt. We will let pt wear the garment and remove prn and she will have her son and grand daughter come on Friday to practice also. Pt wants to wait until next week to wrap her left leg.  08/25/2022  Pts wraps removed and leg washed. Pts leg assited onto table before and off table after treatment Performed MLD to Right LE, Supraclavicular, 5 Breaths, Right axillary and Inguinal LN's, right lateral leg, medial to lateral and lateral leg, retracing pathways then lower leg retracing steps, and then foot and ankle retracing all steps and ending with LN's. Compression bandages applied: cocoa butter to entire RUE, applied TG soft, elastomull to digits 1-5, artiflex foot to knee, 8 cm wrap foot to ankle with figure 8/s, 10 cm wrap with 2 heel locks and then up leg., last 10 cm from ankle to knee.  Large TG soft from below knee to groin folded at top .  08/21/2022  Pts wraps removed and leg washed. Pt measured Performed MLD to Right LE, Supraclavicular, 5 Breaths, Right axillary and Inguinal LN's, right lateral leg, medial to lateral and lateral leg, retracing pathways then lower leg retracing steps, and then foot and ankle retracing all steps and ending with LN's. Compression bandages applied: cocoa butter to entire RUE, applied TG soft, elastomull to digits 1-5, artiflex foot to knee, 8 cm wrap foot to ankle with figure 8/s, 10 cm wrap with 2 heel locks and then up leg., last 10 cm from ankle to knee.  Large TG soft from below knee to groin folded at top .  08/20/2022  Pts wraps removed and leg washed.  Performed MLD to Right LE, Supraclavicular, 5 Breaths, Right axillary and Inguinal LN's, right lateral leg, medial to lateral and lateral leg, retracing pathways then lower leg retracing steps,  and then foot and ankle retracing all steps and ending with LN's. Compression bandages applied: cocoa butter to entire RUE, applied TG soft, elastomull to digits 1-5, artiflex foot to knee, 8 cm wrap foot to ankle with figure 8/s, 10 cm wrap with 2 heel locks and then up leg., last 10 cm from ankle to knee.  Large TG soft from below knee to groin folded at top . Ordered compression garments for pt using her Credit Card: XL MAX Juzo compression wrap with slip on feature and Juzo compression  wrap Foot size Medium. Estimated shipping time 4-8 days. Pt has confirmation  08/18/2022 Pts wraps removed and leg washed.  Pt measured for Juzo Compression wrap with slip on Feature and Juzo compression wrap foot. Awaiting contact with Lauen at [facility] to see if [employer] will pay secondary to Cancer rx at [facility]. Compression bandages applied: lotion to entire RUE, applied TG soft, elastomull to digits 1-5, artiflex foot to knee, 8 cm wrap foot to ankle with figure 8/s, 10 cm wrap with 2 heel locks and then up leg., last 10 cm from ankle to knee.  Large TG soft from below knee to groin folded at top as therapist does not feel a wrap here would stay . Also tried a Financial trader grip wrap at knee and distal thigh but it did not conform well.    PATIENT EDUCATION:  Education details: Treatment plan, types of compression, footwear, Person educated: Patient and Child(ren),son Chartered loss adjuster method: Explanation Education comprehension: verbalized understanding   HOME EXERCISE PROGRAM: NA  ASSESSMENT:  CLINICAL IMPRESSION: Pt was able to don her garments independently but has some difficulty getting longer straps anchored with good pressure. Her grand daughter Andy Gauss did very well and has a good understanding of the wraps and how to apply them. OBJECTIVE IMPAIRMENTS decreased activity tolerance, difficulty walking, and increased edema.   We will initiate left LE wrapping next week. ACTIVITY LIMITATIONS  carrying, standing, stairs, bed mobility, and locomotion levelactivities requiring standing, stairs, extended walking  PARTICIPATION LIMITATIONS:  as above  PERSONAL FACTORS Age, Time since onset of injury/illness/exacerbation, and 1-2 comorbidities: Lymphoma, prioer DVT/PE  are also affecting patient's functional outcome.   REHAB POTENTIAL: Excellent  CLINICAL DECISION MAKING: Stable/uncomplicated  EVALUATION COMPLEXITY: Low  GOALS: Goals reviewed with patient? Yes  SHORT TERM GOALS: Target date: 09/19/2022    Pt will have decreased edema at 10 cm prox to floor by atleast 2.5 cm Baseline LEFT 29.4, RIGHT 34.3 Goal status: MET right 08/08/2022  2.  Pt will have decreased edema at 30 cm prox to floor by 3 cm Baseline: Left 54.6, Right 48.5 Goal status: MET right 08/13/2022  3.  Pts. Family member will be independent in compression bandaging so pt may bathe at home. Baseline: dependent Goal status: Instructed but has not performed  LONG TERM GOALS: Target date: 10/10/2022    Pt will have decreased edema at 10 cm prox to floor by 4 cm Baseline: left 37.4, Right 34/3 Goal status: MET right 08/22/2022  2.  Pt will have edema reduction at 30 cm prox to floor by 5 cm Baseline: Left 54.6, right 48.5 Goal status: MET Right 08/22/2022  3.  Pt will be fit with appropriate compression garments;likely velcro wrap, and will be independent in their use Baseline:  Goal status: MET for right but still awaiting garments  4.  Pt will have flexitouch demo to determine her desire for having the pump to assist with swelling Baseline:  Goal status: Deferred. Pt not interested   PLAN: PT FREQUENCY: 3x/week  PT DURATION: 6 weeks  PLANNED INTERVENTIONS: Therapeutic exercises, Patient/Family education, Self Care, Orthotic/Fit training, Manual lymph drainage, Compression bandaging, Vasopneumatic device, Manual therapy, and Re-evaluation  PLAN FOR NEXT SESSION: Measure pt Pt NOT interested in  Flexitouch, continue MLD and wrapping, Initiate Left LE wrapping and continue with Right Juzo wraps Claris Pong, PT 08/29/2022, 11:54 AM

## 2022-09-01 ENCOUNTER — Ambulatory Visit: Payer: Medicare Other

## 2022-09-01 DIAGNOSIS — C8330 Diffuse large B-cell lymphoma, unspecified site: Secondary | ICD-10-CM | POA: Diagnosis not present

## 2022-09-01 DIAGNOSIS — I890 Lymphedema, not elsewhere classified: Secondary | ICD-10-CM | POA: Diagnosis not present

## 2022-09-01 NOTE — Therapy (Addendum)
OUTPATIENT PHYSICAL THERAPY ONCOLOGY TREATMENT  Patient Name: Lauren Santana MRN: [id number] DOB:11/17/1951, 71 y.o., female Today's Date: 09/01/2022     PT End of Session - 09/01/22 1303     Visit Number 13    Number of Visits 25    Date for PT Re-Evaluation 09/29/2022    PT Start Time 1205    PT Stop Time 1255    PT Time Calculation (min) 50 min    Activity Tolerance Patient tolerated treatment well    Behavior During Therapy [facility] for tasks assessed/performed               Past Medical History:  Diagnosis Date   Arthritis    Edema    B/LLE   Endometriosis    High cholesterol    Hypertension    Numbness    feet   Pre-diabetes    Pulmonary embolism ([place])    recurrent, second episode in 06/2020   Walker as ambulation aid    Wears dentures    Wears glasses    Past Surgical History:  Procedure Laterality Date   ABDOMINAL HYSTERECTOMY     and BSO   CATARACT EXTRACTION Left 10/2018   CESAREAN SECTION  1969, 1971   COLONOSCOPY     EYE SURGERY Bilateral    CATARACT SX   HERNIA REPAIR  2001   incisional   IR CATHETER TUBE CHANGE  10/12/2020   IR CATHETER TUBE CHANGE  12/07/2020   IR CHOLANGIOGRAM EXISTING TUBE  09/28/2020   IR PERC CHOLECYSTOSTOMY  08/17/2020   IR RADIOLOGIST EVAL & MGMT  11/22/2020   JOINT REPLACEMENT Left    Knee   MAXILLARY ANTROSTOMY Right 01/20/2020   Procedure: MAXILLARY ANTROSTOMY  WITH BIOPSY CALDWELL APPROACH;  Surgeon: Jerrell Belfast, MD;  Location: [place];  Service: ENT;  Laterality: Right;   MULTIPLE TOOTH EXTRACTIONS  2011   NASAL ENDOSCOPY Right 02/17/2020   Procedure: NASAL ENDOSCOPY WITH RIGHT INFERIOR TURNBINATE REDUCTION;  Surgeon: Jerrell Belfast, MD;  Location: [facility] OR;  Service: ENT;  Laterality: Right;   neck mass removal     "fatty tissue, not cancerous" per pt's son   SINUS ENDO WITH FUSION Right 01/20/2020   Procedure: SINUS ENDO WITH FUSION WITH BIOSPY;  Surgeon: Jerrell Belfast, MD;  Location: [place];  Service: ENT;   Laterality: Right;   TOTAL KNEE ARTHROPLASTY Left 07/17/2014   Procedure: TOTAL KNEE ARTHROPLASTY;  Surgeon: Kerin Salen, MD;  Location: [place];  Service: Orthopedics;  Laterality: Left;   Patient Active Problem List   Diagnosis Date Noted   Anticoagulated 12/11/2020   Cholecystitis, acute 08/16/2020   Abdominal pain 08/16/2020   Severe sepsis ([place]) 08/16/2020   Pressure injury of skin 08/16/2020   Atrial fibrillation ([place]) 07/24/2020   Thrombocytopenia ([place]) 07/24/2020   Anemia of chronic disease 07/24/2020   Chronic pain 07/24/2020   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[place]) 07/24/2020   Pulmonary embolism ([place]) 07/19/2020   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   S/P BSO (bilateral salpingo-oophorectomy) 01/08/2017   S/P hysterectomy 01/08/2017   Tachycardia    Acute deep vein thrombosis (DVT) of right lower extremity ([place]) 02/14/2016   Pulmonary emboli ([place]) 02/13/2016   PE (pulmonary embolism) 02/13/2016   AKI (acute kidney injury) ([place]) 02/13/2016   Pulmonary embolism with acute cor pulmonale ([facility])    Pelvic mass in female    Obesity (BMI 30-39.9) 08/20/2015   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  Essential hypertension, benign 07/23/2014   Edema 07/23/2014   Dyslipidemia 07/23/2014   Lower extremity numbness 07/23/2014   Arthritis of knee 07/17/2014   Postmenopausal bleeding 03/10/2012   Endometriosis of pelvis 01/22/2007    PCP: Beatrix Shipper, MD  REFERRING PROVIDER: [name], MD  REFERRING DIAG: Diffuse large B cell Lymphoma  THERAPY DIAG:  Lymphedema  Lymphoma, large-cell, diffuse ([place])  ONSET DATE: 2 years ago  Rationale for Evaluation and Treatment Rehabilitation  SUBJECTIVE                                                                                                                                                                                           SUBJECTIVE STATEMENT: I took my shower this weekend. My  grandson helped me with the wraps today.  PERTINENT HISTORY:  The patient is a 71 year old woman with a history of DLBCL.She has completed 6 cycles of R-mini CHOP with cycle 6-day 1 being 10/11/2020.  She has chronic Nausea/vomiting but she takes some liquid Sulcrafate and this has helped.  At her office visit  in April with MD she had no clinical signs consistent with disease reoccurence.  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  She has taken furosemide for 2 years with no improvement. She has compression stockings but she has trouble getting them on and does not wear them. She is here with her son [name].(Friend of Dr. Ron Agee)  PAIN:  Are you having pain? Yesfrom neuropathy NPRS scale: unable to rate/10 Pain location: bilateral feet. Pain orientation: Bilateral  PAIN TYPE: nervy Pain description: constant  Aggravating factors: nothing Relieving factors: nothing  PRECAUTIONS: Other: B cell lymphoma, DM  WEIGHT BEARING RESTRICTIONS No  FALLS:  Has patient fallen in last 6 months? no  LIVING ENVIRONMENT: Lives with: Lives Alone Lives in: House/apartment Stairs: No;  Has following equipment at home: Environmental consultant - 4 wheeled, Wheelchair (manual), shower chair, Shower bench, and bed side commode  OCCUPATION: retired Aeronautical engineer  LEISURE: reading, prayer, encourager to family and friends  HAND DOMINANCE : right   PRIOR LEVEL OF FUNCTION: Independent with household mobility with device, even does her own sweeping and mopping  PATIENT GOALS Decrease leg swelling   OBJECTIVE  COGNITION:  Overall cognitive status: Within functional limits for tasks assessed   PALPATION: Fibrosis noted especially right calf region, mild pitting without tenderness at bilateral lower legs  OBSERVATIONS / OTHER ASSESSMENTS: Right leg with increased swelling greater than left, ankles very swollen above her slipper but  reduced at foot.  SENSATION:  Light touch: Appears intact    POSTURE: forward head, rounded shoulders    UPPER EXTREMITY STRENGTH: NT   LOWER EXTREMITY MMT: NT  LYMPHEDEMA ASSESSMENTS:   SURGERY TYPE/DATE: NA  NUMBER OF LYMPH NODES REMOVED: NA  CHEMOTHERAPY: Yes  RADIATION:no  HORMONE TREATMENT: no  INFECTIONS: NA  LYMPHEDEMA ASSESSMENTS:     LOWER EXTREMITY LANDMARK RIGHT eval 08/08/2022 08/13/2022 08/22/2022 08/29/2022  At groin       30 cm proximal to suprapatella       20 cm proximal to suprapatella       10 cm proximal to suprapatella       At midpatella / popliteal crease 47.3      30 cm proximal to floor at lateral plantar foot 54.6 49.5 50 47.5 48  20 cm proximal to floor at lateral plantar foot 39.9 37.1 36.8 38.3 38.6  10 cm proximal to floor at lateral plantar foot 34.3 27.7 26.3 25.8 26.2  Circumference of ankle/heel       5 cm proximal to 1st MTP joint 27.0   23.2 23.9  Across MTP joint 24.3      Around proximal great toe 9.4   8.0 9.0  (Blank rows = not tested)  LOWER EXTREMITY LANDMARK LEFT eval  At groin   30 cm proximal to suprapatella   20 cm proximal to suprapatella   10 cm proximal to suprapatella   At midpatella / popliteal crease 43.4  30 cm proximal to floor at lateral plantar foot 48.5  20 cm proximal to floor at lateral plantar foot 37.4  10 cm proximal to floor at lateral plantar foot 29.4  Circumference of ankle/heel   5 cm proximal to 1st MTP joint 24.5  Across MTP joint 23.4  Around proximal great toe 8.2  (Blank rows = not tested)    GAIT: Distance walked: to room and back to front office after rx Assistive device utilized: Environmental consultant - 4 wheeled Level of assistance: Modified independence Comments: flexed posture, decreased heel strike toe off     TODAY'S TREATMENT  09/01/2022  Tightened right LE velcro wrap slightly for pt.  Pts legs assisted onto table before and off table after treatment Performed MLD to left LE,  Supraclavicular, 5 Breaths, Left axillary and Inguinal LN's, left  lateral leg, medial to lateral and lateral leg, retracing pathways then lower leg retracing steps, and then foot and ankle retracing all steps and ending with LN's. Compression bandages applied to left LE: cocoa butter to entire LLE, applied TG soft,  2"elastomull to digits 1-5, artiflex foot to knee, 8 cm wrap foot to ankle with figure 8/s, 10 cm wrap with 2 heel locks and then up leg., last 10 cm from ankle to knee.  Large TG soft from below knee to groin folded at top .   08/29/2022 PT arrived with her grand daughter [name] Pts velcro wraps were removed and they were shown how to anchor velcro to keep it out of the way  Pt was remeasured with only slight increases since wearing the wraps, but noted swelling at mid ankle. Pt was sitting with her leg on table and donned undersock independently. She then had her leg on the floor and was able to pull the Juzo wrap up and was able to anchor all straps with some difficulty with longer straps. She had a little more difficulty with the foot piece and getting it tight. Wraps were removed again and pts grand daughter applied wraps. Educated her  to start at the bottom and showed her how to pull both straps for increased pressure. She did very well and has a good understanding of how to reverse the colors prn. We also discussed exs the pt should be doing and she admitted she has sitting and standing exs that she has not been doing and showed me all of them. She was advised to return to her exercises since her legs are very weak.  08/27/2022 Pts wraps removed and leg washed Velcro wraps removed from boxes and velcro  tabs applied for pt to wear black side out at her request. Pt was given the mildly compressive undersock to apply with a little Assist of PT which she did with her leg outstretched on the treatment table. She was instructed in proper way to don the velcro wrap over the sock and she did so  the first time with some assist and the next time alone. She was able to don the wrap with liner by herself with some difficulty mainly because of trouble reaching the lower leg. The foot piece was also applied first by PT and next by pt with occasional assist. Before leaving pts. son arrived and I explained to him and showed him the top straps and how they are pulled to get good pressure. They are a bit of a challege for pt because of difficulty reaching her foot and lower leg strap. 2 of the straps are long and reach behind her leg, however the garment in a smaller size will probably cause the liner to be too tight for pt. We will let pt wear the garment and remove prn and she will have her son and grand daughter come on Friday to practice also. Pt wants to wait until next week to wrap her left leg.  08/25/2022  Pts wraps removed and leg washed. Pts leg assited onto table before and off table after treatment Performed MLD to Right LE, Supraclavicular, 5 Breaths, Right axillary and Inguinal LN's, right lateral leg, medial to lateral and lateral leg, retracing pathways then lower leg retracing steps, and then foot and ankle retracing all steps and ending with LN's. Compression bandages applied: cocoa butter to entire RUE, applied TG soft, elastomull to digits 1-5, artiflex foot to knee, 8 cm wrap foot to ankle with figure 8/s, 10 cm wrap with 2 heel locks and then up leg., last 10 cm from ankle to knee.  Large TG soft from below knee to groin folded at top .  08/21/2022  Pts wraps removed and leg washed. Pt measured Performed MLD to Right LE, Supraclavicular, 5 Breaths, Right axillary and Inguinal LN's, right lateral leg, medial to lateral and lateral leg, retracing pathways then lower leg retracing steps, and then foot and ankle retracing all steps and ending with LN's. Compression bandages applied: cocoa butter to entire RUE, applied TG soft, elastomull to digits 1-5, artiflex foot to knee, 8 cm wrap  foot to ankle with figure 8/s, 10 cm wrap with 2 heel locks and then up leg., last 10 cm from ankle to knee.  Large TG soft from below knee to groin folded at top .  08/20/2022  Pts wraps removed and leg washed.  Performed MLD to Right LE, Supraclavicular, 5 Breaths, Right axillary and Inguinal LN's, right lateral leg, medial to lateral and lateral leg, retracing pathways then lower leg retracing steps, and then foot and ankle retracing all steps and ending with LN's. Compression bandages applied: cocoa butter to entire RUE, applied  TG soft, elastomull to digits 1-5, artiflex foot to knee, 8 cm wrap foot to ankle with figure 8/s, 10 cm wrap with 2 heel locks and then up leg., last 10 cm from ankle to knee.  Large TG soft from below knee to groin folded at top . Ordered compression garments for pt using her Credit Card: XL MAX Juzo compression wrap with slip on feature and Juzo compression wrap Foot size Medium. Estimated shipping time 4-8 days. Pt has confirmation  08/18/2022 Pts wraps removed and leg washed.  Pt measured for Juzo Compression wrap with slip on Feature and Juzo compression wrap foot. Awaiting contact with Lauen at [facility] to see if [employer] will pay secondary to Cancer rx at [facility]. Compression bandages applied: lotion to entire RUE, applied TG soft, elastomull to digits 1-5, artiflex foot to knee, 8 cm wrap foot to ankle with figure 8/s, 10 cm wrap with 2 heel locks and then up leg., last 10 cm from ankle to knee.  Large TG soft from below knee to groin folded at top as therapist does not feel a wrap here would stay . Also tried a Financial trader grip wrap at knee and distal thigh but it did not conform well.    PATIENT EDUCATION:  Education details: Treatment plan, types of compression, footwear, Person educated: Patient and Child(ren),son Chartered loss adjuster method: Explanation Education comprehension: verbalized understanding   HOME EXERCISE  PROGRAM: NA  ASSESSMENT:  CLINICAL IMPRESSION: Pt had assistance from [place] today to help her with velcro wraps on right LE.  Pts Velcro wraps holding edema quite well. Initiated MLD and compression bandaging to left LE today to the knee.. Pt was able to walk fine with the velcro wrap on right LE and bandages on left. She has still not purchased shoes yet and is wearing gripper socks on both feet. Anticipate 2 weeks of bandaging to the left and then purchasing velcro wrap for left LE.  OBJECTIVE IMPAIRMENTS decreased activity tolerance, difficulty walking, and increased edema.   We will initiate left LE wrapping next week. ACTIVITY LIMITATIONS carrying, standing, stairs, bed mobility, and locomotion levelactivities requiring standing, stairs, extended walking  PARTICIPATION LIMITATIONS:  as above  PERSONAL FACTORS Age, Time since onset of injury/illness/exacerbation, and 1-2 comorbidities: Lymphoma, prioer DVT/PE  are also affecting patient's functional outcome.   REHAB POTENTIAL: Excellent  CLINICAL DECISION MAKING: Stable/uncomplicated  EVALUATION COMPLEXITY: Low  GOALS: Goals reviewed with patient? Yes  SHORT TERM GOALS: Target date: 09/22/2022    Pt will have decreased edema at 10 cm prox to floor by atleast 2.5 cm Baseline LEFT 29.4, RIGHT 34.3 Goal status: MET right 08/08/2022  2.  Pt will have decreased edema at 30 cm prox to floor by 3 cm Baseline: Left 54.6, Right 48.5 Goal status: MET right 08/13/2022  3.  Pts. Family member will be independent in compression bandaging so pt may bathe at home. Baseline: dependent Goal status: Instructed but has not performed  LONG TERM GOALS: Target date: 10/13/2022    Pt will have decreased edema at 10 cm prox to floor by 4 cm Baseline: left 37.4, Right 34/3 Goal status: MET right 08/22/2022  2.  Pt will have edema reduction at 30 cm prox to floor by 5 cm Baseline: Left 54.6, right 48.5 Goal status: MET Right 08/22/2022  3.   Pt will be fit with appropriate compression garments;likely velcro wrap, and will be independent in their use Baseline:  Goal status: MET for right but still awaiting  garments  4.  Pt will have flexitouch demo to determine her desire for having the pump to assist with swelling Baseline:  Goal status: Deferred. Pt not interested   PLAN: PT FREQUENCY: 3x/week  PT DURATION: 6 weeks  PLANNED INTERVENTIONS: Therapeutic exercises, Patient/Family education, Self Care, Orthotic/Fit training, Manual lymph drainage, Compression bandaging, Vasopneumatic device, Manual therapy, and Re-evaluation  PLAN FOR NEXT SESSION: Measure pt Pt NOT interested in Flexitouch, continue MLD and wrapping, Initiate Left LE wrapping and continue with Right Juzo wraps Claris Pong, PT 09/01/2022, 1:08 PM

## 2022-09-04 ENCOUNTER — Ambulatory Visit: Payer: Medicare Other

## 2022-09-04 DIAGNOSIS — I890 Lymphedema, not elsewhere classified: Secondary | ICD-10-CM | POA: Diagnosis not present

## 2022-09-04 DIAGNOSIS — C8330 Diffuse large B-cell lymphoma, unspecified site: Secondary | ICD-10-CM

## 2022-09-04 NOTE — Therapy (Signed)
OUTPATIENT PHYSICAL THERAPY ONCOLOGY TREATMENT  Patient Name: Lauren Santana MRN: [id number] DOB:03/24/1951, 71 y.o., female Today's Date: 09/04/2022     PT End of Session - 09/04/22 1404     Visit Number 14    Number of Visits 25    Date for PT Re-Evaluation 09/29/22    PT Start Time 6144    PT Stop Time 1455    PT Time Calculation (min) 50 min    Activity Tolerance Patient tolerated treatment well    Behavior During Therapy [facility] for tasks assessed/performed               Past Medical History:  Diagnosis Date   Arthritis    Edema    B/LLE   Endometriosis    High cholesterol    Hypertension    Numbness    feet   Pre-diabetes    Pulmonary embolism (HCC)    recurrent, second episode in 06/2020   Walker as ambulation aid    Wears dentures    Wears glasses    Past Surgical History:  Procedure Laterality Date   ABDOMINAL HYSTERECTOMY     and BSO   CATARACT EXTRACTION Left 10/2018   CESAREAN SECTION  1969, 1971   COLONOSCOPY     EYE SURGERY Bilateral    CATARACT SX   HERNIA REPAIR  2001   incisional   IR CATHETER TUBE CHANGE  10/12/2020   IR CATHETER TUBE CHANGE  12/07/2020   IR CHOLANGIOGRAM EXISTING TUBE  09/28/2020   IR PERC CHOLECYSTOSTOMY  08/17/2020   IR RADIOLOGIST EVAL & MGMT  11/22/2020   JOINT REPLACEMENT Left    Knee   MAXILLARY ANTROSTOMY Right 01/20/2020   Procedure: MAXILLARY ANTROSTOMY  WITH BIOPSY CALDWELL APPROACH;  Surgeon: Jerrell Belfast, MD;  Location: [place];  Service: ENT;  Laterality: Right;   MULTIPLE TOOTH EXTRACTIONS  2011   NASAL ENDOSCOPY Right 02/17/2020   Procedure: NASAL ENDOSCOPY WITH RIGHT INFERIOR TURNBINATE REDUCTION;  Surgeon: Jerrell Belfast, MD;  Location: [facility] OR;  Service: ENT;  Laterality: Right;   neck mass removal     "fatty tissue, not cancerous" per pt's son   SINUS ENDO WITH FUSION Right 01/20/2020   Procedure: SINUS ENDO WITH FUSION WITH BIOSPY;  Surgeon: Jerrell Belfast, MD;  Location: [place];  Service: ENT;   Laterality: Right;   TOTAL KNEE ARTHROPLASTY Left 07/17/2014   Procedure: TOTAL KNEE ARTHROPLASTY;  Surgeon: Kerin Salen, MD;  Location: [place];  Service: Orthopedics;  Laterality: Left;   Patient Active Problem List   Diagnosis Date Noted   Anticoagulated 12/11/2020   Cholecystitis, acute 08/16/2020   Abdominal pain 08/16/2020   Severe sepsis ([place]) 08/16/2020   Pressure injury of skin 08/16/2020   Atrial fibrillation ([place]) 07/24/2020   Thrombocytopenia ([place]) 07/24/2020   Anemia of chronic disease 07/24/2020   Chronic pain 07/24/2020   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[place]) 07/24/2020   Pulmonary embolism ([place]) 07/19/2020   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   S/P BSO (bilateral salpingo-oophorectomy) 01/08/2017   S/P hysterectomy 01/08/2017   Tachycardia    Acute deep vein thrombosis (DVT) of right lower extremity ([place]) 02/14/2016   Pulmonary emboli ([place]) 02/13/2016   PE (pulmonary embolism) 02/13/2016   AKI (acute kidney injury) ([place]) 02/13/2016   Pulmonary embolism with acute cor pulmonale (HCC)    Pelvic mass in female    Obesity (BMI 30-39.9) 08/20/2015   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  Essential hypertension, benign 07/23/2014   Edema 07/23/2014   Dyslipidemia 07/23/2014   Lower extremity numbness 07/23/2014   Arthritis of knee 07/17/2014   Postmenopausal bleeding 03/10/2012   Endometriosis of pelvis 01/22/2007    PCP: Beatrix Shipper, MD  REFERRING PROVIDER: [name], MD  REFERRING DIAG: Diffuse large B cell Lymphoma  THERAPY DIAG:  Lymphedema  Lymphoma, large-cell, diffuse ([place])  ONSET DATE: 2 years ago  Rationale for Evaluation and Treatment Rehabilitation  SUBJECTIVE                                                                                                                                                                                           SUBJECTIVE STATEMENT: My wrap from Monday stayed intact. I  have my velcro wraps still on since last visit.  PERTINENT HISTORY:  The patient is a 71 year old woman with a history of DLBCL.She has completed 6 cycles of R-mini CHOP with cycle 6-day 1 being 10/11/2020.  She has chronic Nausea/vomiting but she takes some liquid Sulcrafate and this has helped.  At her office visit  in April with MD she had no clinical signs consistent with disease reoccurence.  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  She has taken furosemide for 2 years with no improvement. She has compression stockings but she has trouble getting them on and does not wear them. She is here with her son [name].(Friend of Dr. Ron Agee)  PAIN:  Are you having pain? Yesfrom neuropathy NPRS scale: unable to rate/10 Pain location: bilateral feet. Pain orientation: Bilateral  PAIN TYPE: nervy Pain description: constant  Aggravating factors: nothing Relieving factors: nothing  PRECAUTIONS: Other: B cell lymphoma, DM  WEIGHT BEARING RESTRICTIONS No  FALLS:  Has patient fallen in last 6 months? no  LIVING ENVIRONMENT: Lives with: Lives Alone Lives in: House/apartment Stairs: No;  Has following equipment at home: Environmental consultant - 4 wheeled, Wheelchair (manual), shower chair, Shower bench, and bed side commode  OCCUPATION: retired Aeronautical engineer  LEISURE: reading, prayer, encourager to family and friends  HAND DOMINANCE : right   PRIOR LEVEL OF FUNCTION: Independent with household mobility with device, even does her own sweeping and mopping  PATIENT GOALS Decrease leg swelling   OBJECTIVE  COGNITION:  Overall cognitive status: Within functional limits for tasks assessed   PALPATION: Fibrosis noted especially right calf region, mild pitting without tenderness at bilateral lower legs  OBSERVATIONS / OTHER ASSESSMENTS: Right leg with increased swelling greater than left, ankles very swollen above her slipper  but reduced at foot.  SENSATION:  Light touch: Appears intact    POSTURE: forward head, rounded shoulders    UPPER EXTREMITY STRENGTH: NT   LOWER EXTREMITY MMT: NT  LYMPHEDEMA ASSESSMENTS:   SURGERY TYPE/DATE: NA  NUMBER OF LYMPH NODES REMOVED: NA  CHEMOTHERAPY: Yes  RADIATION:no  HORMONE TREATMENT: no  INFECTIONS: NA  LYMPHEDEMA ASSESSMENTS:     LOWER EXTREMITY LANDMARK RIGHT eval 08/08/2022 08/13/2022 08/22/2022 08/29/2022 09/04/2022  At groin        30 cm proximal to suprapatella        20 cm proximal to suprapatella        10 cm proximal to suprapatella        At midpatella / popliteal crease 47.3       30 cm proximal to floor at lateral plantar foot 54.6 49.5 50 47.5 48 48.7  20 cm proximal to floor at lateral plantar foot 39.9 37.1 36.8 38.3 38.6 40  10 cm proximal to floor at lateral plantar foot 34.3 27.7 26.3 25.8 26.2 27  Circumference of ankle/heel        5 cm proximal to 1st MTP joint 27.0   23.2 23.9 25.6  Across MTP joint 24.3       Around proximal great toe 9.4   8.0 9.0 9.0  (Blank rows = not tested)  LOWER EXTREMITY LANDMARK LEFT eval 09/04/2022  At groin    30 cm proximal to suprapatella    20 cm proximal to suprapatella    10 cm proximal to suprapatella    At midpatella / popliteal crease 43.4   30 cm proximal to floor at lateral plantar foot 48.5 45.8  20 cm proximal to floor at lateral plantar foot 37.4 34.5  10 cm proximal to floor at lateral plantar foot 29.4 26  Circumference of ankle/heel    5 cm proximal to 1st MTP joint 24.5 23.5  Across MTP joint 23.4   Around proximal great toe 8.2 7.7  (Blank rows = not tested)    GAIT: Distance walked: to room and back to front office after rx Assistive device utilized: Environmental consultant - 4 wheeled Level of assistance: Modified independence Comments: flexed posture, decreased heel strike toe off     TODAY'S TREATMENT  09/04/2022 Pts velcro wraps on right removed and left LE wraps removed  and legs remeasured. Bilateral LE's washed with soap and water. Performed MLD to left LE, Supraclavicular, 5 Breaths, Left axillary and Inguinal LN's, left  lateral leg, medial to lateral and lateral leg, retracing pathways then lower leg retracing steps, and then foot and ankle retracing all steps and ending with LN's. Compression bandages applied: cocoa butter to entire LLE, applied TG soft,  2"elastomull to digits 1-5, artiflex foot to knee, 8 cm wrap foot to ankle with figure 8/s, 10 cm wrap with 2 heel locks and then up leg., last 10 cm from ankle to knee. Applied Right LE Stocking with velcro wraps to foot and ankle  09/01/2022  Tightened right LE velcro wrap slightly for pt.  Pts legs assisted onto table before and off table after treatment Performed MLD to left LE, Supraclavicular, 5 Breaths, Left axillary and Inguinal LN's, left  lateral leg, medial to lateral and lateral leg, retracing pathways then lower leg retracing steps, and then foot and ankle retracing all steps and ending with LN's. Compression bandages applied: cocoa butter to entire LLE, applied TG soft,  2"elastomull to digits 1-5, artiflex foot to knee, 8 cm wrap foot to ankle with figure  8/s, 10 cm wrap with 2 heel locks and then up leg., last 10 cm from ankle to knee.  Large TG soft from below knee to groin folded at top .   08/29/2022 PT arrived with her grand daughter [name] Pts velcro wraps were removed and they were shown how to anchor velcro to keep it out of the way  Pt was remeasured with only slight increases since wearing the wraps, but noted swelling at mid ankle. Pt was sitting with her leg on table and donned undersock independently. She then had her leg on the floor and was able to pull the Juzo wrap up and was able to anchor all straps with some difficulty with longer straps. She had a little more difficulty with the foot piece and getting it tight. Wraps were removed again and pts grand daughter applied wraps.  Educated her to start at the bottom and showed her how to pull both straps for increased pressure. She did very well and has a good understanding of how to reverse the colors prn. We also discussed exs the pt should be doing and she admitted she has sitting and standing exs that she has not been doing and showed me all of them. She was advised to return to her exercises since her legs are very weak.  08/27/2022 Pts wraps removed and leg washed Velcro wraps removed from boxes and velcro  tabs applied for pt to wear black side out at her request. Pt was given the mildly compressive undersock to apply with a little Assist of PT which she did with her leg outstretched on the treatment table. She was instructed in proper way to don the velcro wrap over the sock and she did so the first time with some assist and the next time alone. She was able to don the wrap with liner by herself with some difficulty mainly because of trouble reaching the lower leg. The foot piece was also applied first by PT and next by pt with occasional assist. Before leaving pts. son arrived and I explained to him and showed him the top straps and how they are pulled to get good pressure. They are a bit of a challege for pt because of difficulty reaching her foot and lower leg strap. 2 of the straps are long and reach behind her leg, however the garment in a smaller size will probably cause the liner to be too tight for pt. We will let pt wear the garment and remove prn and she will have her son and grand daughter come on Friday to practice also. Pt wants to wait until next week to wrap her left leg.  08/25/2022  Pts wraps removed and leg washed. Pts leg assited onto table before and off table after treatment Performed MLD to Right LE, Supraclavicular, 5 Breaths, Right axillary and Inguinal LN's, right lateral leg, medial to lateral and lateral leg, retracing pathways then lower leg retracing steps, and then foot and ankle retracing all  steps and ending with LN's. Compression bandages applied: cocoa butter to entire RUE, applied TG soft, elastomull to digits 1-5, artiflex foot to knee, 8 cm wrap foot to ankle with figure 8/s, 10 cm wrap with 2 heel locks and then up leg., last 10 cm from ankle to knee.  Large TG soft from below knee to groin folded at top .  08/21/2022  Pts wraps removed and leg washed. Pt measured Performed MLD to Right LE, Supraclavicular, 5 Breaths, Right axillary and Inguinal  LN's, right lateral leg, medial to lateral and lateral leg, retracing pathways then lower leg retracing steps, and then foot and ankle retracing all steps and ending with LN's. Compression bandages applied: cocoa butter to entire RUE, applied TG soft, elastomull to digits 1-5, artiflex foot to knee, 8 cm wrap foot to ankle with figure 8/s, 10 cm wrap with 2 heel locks and then up leg., last 10 cm from ankle to knee.  Large TG soft from below knee to groin folded at top .  08/20/2022  Pts wraps removed and leg washed.  Performed MLD to Right LE, Supraclavicular, 5 Breaths, Right axillary and Inguinal LN's, right lateral leg, medial to lateral and lateral leg, retracing pathways then lower leg retracing steps, and then foot and ankle retracing all steps and ending with LN's. Compression bandages applied: cocoa butter to entire RUE, applied TG soft, elastomull to digits 1-5, artiflex foot to knee, 8 cm wrap foot to ankle with figure 8/s, 10 cm wrap with 2 heel locks and then up leg., last 10 cm from ankle to knee.  Large TG soft from below knee to groin folded at top . Ordered compression garments for pt using her Credit Card: XL MAX Juzo compression wrap with slip on feature and Juzo compression wrap Foot size Medium. Estimated shipping time 4-8 days. Pt has confirmation  08/18/2022 Pts wraps removed and leg washed.  Pt measured for Juzo Compression wrap with slip on Feature and Juzo compression wrap foot. Awaiting contact with Lauen at [facility]  to see if [employer] will pay secondary to Cancer rx at [facility]. Compression bandages applied: lotion to entire RUE, applied TG soft, elastomull to digits 1-5, artiflex foot to knee, 8 cm wrap foot to ankle with figure 8/s, 10 cm wrap with 2 heel locks and then up leg., last 10 cm from ankle to knee.  Large TG soft from below knee to groin folded at top as therapist does not feel a wrap here would stay . Also tried a Financial trader grip wrap at knee and distal thigh but it did not conform well.    PATIENT EDUCATION:  Education details: Treatment plan, types of compression, footwear, Person educated: Patient and Child(ren),son Chartered loss adjuster method: Explanation Education comprehension: verbalized understanding   HOME EXERCISE PROGRAM: NA  ASSESSMENT:  CLINICAL IMPRESSION: Pt has not been removing velcro wrap on right LE at all although she was advised she can do so. Right LE with some increased swelling noted in right foot ankle and calf. Pt advised velcro wraps need to be more snug. Also suggested she try to elevate her legs more. Hope to measure for left velcro wraps next week. .  OBJECTIVE IMPAIRMENTS decreased activity tolerance, difficulty walking, and increased edema.   We will initiate left LE wrapping next week. ACTIVITY LIMITATIONS carrying, standing, stairs, bed mobility, and locomotion levelactivities requiring standing, stairs, extended walking  PARTICIPATION LIMITATIONS:  as above  PERSONAL FACTORS Age, Time since onset of injury/illness/exacerbation, and 1-2 comorbidities: Lymphoma, prioer DVT/PE  are also affecting patient's functional outcome.   REHAB POTENTIAL: Excellent  CLINICAL DECISION MAKING: Stable/uncomplicated  EVALUATION COMPLEXITY: Low  GOALS: Goals reviewed with patient? Yes  SHORT TERM GOALS: Target date: 09/25/2022    Pt will have decreased edema at 10 cm prox to floor by atleast 2.5 cm Baseline LEFT 29.4, RIGHT 34.3 Goal status: MET right  08/08/2022  2.  Pt will have decreased edema at 30 cm prox to floor by 3 cm Baseline: Left  54.6, Right 48.5 Goal status: MET right 08/13/2022  3.  Pts. Family member will be independent in compression bandaging so pt may bathe at home. Baseline: dependent Goal status: Instructed but has not performed  LONG TERM GOALS: Target date: 10/16/2022    Pt will have decreased edema at 10 cm prox to floor by 4 cm Baseline: left 37.4, Right 34/3 Goal status: MET right 08/22/2022  2.  Pt will have edema reduction at 30 cm prox to floor by 5 cm Baseline: Left 54.6, right 48.5 Goal status: MET Right 08/22/2022  3.  Pt will be fit with appropriate compression garments;likely velcro wrap, and will be independent in their use Baseline:  Goal status: MET for right but still awaiting garments  4.  Pt will have flexitouch demo to determine her desire for having the pump to assist with swelling Baseline:  Goal status: Deferred. Pt not interested   PLAN: PT FREQUENCY: 3x/week  PT DURATION: 6 weeks  PLANNED INTERVENTIONS: Therapeutic exercises, Patient/Family education, Self Care, Orthotic/Fit training, Manual lymph drainage, Compression bandaging, Vasopneumatic device, Manual therapy, and Re-evaluation  PLAN FOR NEXT SESSION:  Pt NOT interested in Flexitouch, continue MLD and wrapping, Initiate Left LE wrapping and continue with Right Juzo wraps Measure left for velcro next week if ready.   Claris Pong, PT 09/04/2022, 5:27 PM

## 2022-09-04 NOTE — Addendum Note (Signed)
Addended by: Claris Pong on: 09/04/2022 05:26 PM   Modules accepted: Orders

## 2022-09-08 ENCOUNTER — Ambulatory Visit: Payer: Medicare Other

## 2022-09-08 DIAGNOSIS — I890 Lymphedema, not elsewhere classified: Secondary | ICD-10-CM | POA: Diagnosis not present

## 2022-09-08 DIAGNOSIS — C8330 Diffuse large B-cell lymphoma, unspecified site: Secondary | ICD-10-CM

## 2022-09-08 NOTE — Therapy (Addendum)
OUTPATIENT PHYSICAL THERAPY ONCOLOGY TREATMENT  Patient Name: Lauren Santana MRN: [id number] DOB:03/16/1951, 71 y.o., female Today's Date: 09/08/2022     PT End of Session - 09/08/22 1403     Visit Number 15    Number of Visits 25    Date for PT Re-Evaluation 09/29/22    PT Start Time 1404    PT Stop Time 1456    PT Time Calculation (min) 52 min    Activity Tolerance Patient tolerated treatment well    Behavior During Therapy [facility] for tasks assessed/performed               Past Medical History:  Diagnosis Date   Arthritis    Edema    B/LLE   Endometriosis    High cholesterol    Hypertension    Numbness    feet   Pre-diabetes    Pulmonary embolism ([place])    recurrent, second episode in 06/2020   Walker as ambulation aid    Wears dentures    Wears glasses    Past Surgical History:  Procedure Laterality Date   ABDOMINAL HYSTERECTOMY     and BSO   CATARACT EXTRACTION Left 10/2018   CESAREAN SECTION  1969, 1971   COLONOSCOPY     EYE SURGERY Bilateral    CATARACT SX   HERNIA REPAIR  2001   incisional   IR CATHETER TUBE CHANGE  10/12/2020   IR CATHETER TUBE CHANGE  12/07/2020   IR CHOLANGIOGRAM EXISTING TUBE  09/28/2020   IR PERC CHOLECYSTOSTOMY  08/17/2020   IR RADIOLOGIST EVAL & MGMT  11/22/2020   JOINT REPLACEMENT Left    Knee   MAXILLARY ANTROSTOMY Right 01/20/2020   Procedure: MAXILLARY ANTROSTOMY  WITH BIOPSY CALDWELL APPROACH;  Surgeon: Lauren Belfast, MD;  Location: [place];  Service: ENT;  Laterality: Right;   MULTIPLE TOOTH EXTRACTIONS  2011   NASAL ENDOSCOPY Right 02/17/2020   Procedure: NASAL ENDOSCOPY WITH RIGHT INFERIOR TURNBINATE REDUCTION;  Surgeon: Lauren Belfast, MD;  Location: [facility] OR;  Service: ENT;  Laterality: Right;   neck mass removal     "fatty tissue, not cancerous" per pt's son   SINUS ENDO WITH FUSION Right 01/20/2020   Procedure: SINUS ENDO WITH FUSION WITH BIOSPY;  Surgeon: Lauren Belfast, MD;  Location: [place];  Service: ENT;   Laterality: Right;   TOTAL KNEE ARTHROPLASTY Left 07/17/2014   Procedure: TOTAL KNEE ARTHROPLASTY;  Surgeon: Lauren Salen, MD;  Location: [place];  Service: Orthopedics;  Laterality: Left;   Patient Active Problem List   Diagnosis Date Noted   Anticoagulated 12/11/2020   Cholecystitis, acute 08/16/2020   Abdominal pain 08/16/2020   Severe sepsis ([place]) 08/16/2020   Pressure injury of skin 08/16/2020   Atrial fibrillation ([place]) 07/24/2020   Thrombocytopenia ([place]) 07/24/2020   Anemia of chronic disease 07/24/2020   Chronic pain 07/24/2020   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[place]) 07/24/2020   Pulmonary embolism ([place]) 07/19/2020   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   S/P BSO (bilateral salpingo-oophorectomy) 01/08/2017   S/P hysterectomy 01/08/2017   Tachycardia    Acute deep vein thrombosis (DVT) of right lower extremity ([place]) 02/14/2016   Pulmonary emboli ([place]) 02/13/2016   PE (pulmonary embolism) 02/13/2016   AKI (acute kidney injury) ([place]) 02/13/2016   Pulmonary embolism with acute cor pulmonale (HCC)    Pelvic mass in female    Obesity (BMI 30-39.9) 08/20/2015   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  Essential hypertension, benign 07/23/2014   Edema 07/23/2014   Dyslipidemia 07/23/2014   Lower extremity numbness 07/23/2014   Arthritis of knee 07/17/2014   Postmenopausal bleeding 03/10/2012   Endometriosis of pelvis 01/22/2007    PCP: Lauren Shipper, MD  REFERRING PROVIDER: [name], MD  REFERRING DIAG: Diffuse large B cell Lymphoma  THERAPY DIAG:  Lymphedema  Lymphoma, large-cell, diffuse ([place])  ONSET DATE: 2 years ago  Rationale for Evaluation and Treatment Rehabilitation  SUBJECTIVE                                                                                                                                                                                           SUBJECTIVE STATEMENT: I went throught the ringer since I was  here last. I slipped from my couch and slid down on the floor. My son had to come get in the door with his key. I showered yesterday so I had to take the wraps off. Lauren Santana was there so she helped me  PERTINENT HISTORY:  The patient is a 71 year old woman with a history of DLBCL.She has completed 6 cycles of R-mini CHOP with cycle 6-day 1 being 10/11/2020.  She has chronic Nausea/vomiting but she takes some liquid Sulcrafate and this has helped.  At her office visit  in April with MD she had no clinical signs consistent with disease reoccurence.  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  She has taken furosemide for 2 years with no improvement. She has compression stockings but she has trouble getting them on and does not wear them. She is here with her son Lauren Santana.(Friend of Dr. Ron Santana)  PAIN:  Are you having pain? Yesfrom neuropathy NPRS scale: unable to rate/10 Pain location: bilateral feet. Pain orientation: Bilateral  PAIN TYPE: nervy Pain description: constant  Aggravating factors: nothing Relieving factors: nothing  PRECAUTIONS: Other: B cell lymphoma, DM  WEIGHT BEARING RESTRICTIONS No  FALLS:  Has patient fallen in last 6 months? no  LIVING ENVIRONMENT: Lives with: Lives Alone Lives in: House/apartment Stairs: No;  Has following equipment at home: Environmental consultant - 4 wheeled, Wheelchair (manual), shower chair, Shower bench, and bed side commode  OCCUPATION: retired Aeronautical engineer  LEISURE: reading, prayer, encourager to family and friends  HAND DOMINANCE : right   PRIOR LEVEL OF FUNCTION: Independent with household mobility with device, even does her own sweeping and mopping  PATIENT GOALS Decrease leg swelling   OBJECTIVE  COGNITION:  Overall cognitive status: Within functional limits for tasks assessed   PALPATION: Fibrosis noted especially right  calf region, mild pitting without tenderness at  bilateral lower legs  OBSERVATIONS / OTHER ASSESSMENTS: Right leg with increased swelling greater than left, ankles very swollen above her slipper but reduced at foot.  SENSATION:  Light touch: Appears intact    POSTURE: forward head, rounded shoulders    UPPER EXTREMITY STRENGTH: NT   LOWER EXTREMITY MMT: NT  LYMPHEDEMA ASSESSMENTS:   SURGERY TYPE/DATE: NA  NUMBER OF LYMPH NODES REMOVED: NA  CHEMOTHERAPY: Yes  RADIATION:no  HORMONE TREATMENT: no  INFECTIONS: NA  LYMPHEDEMA ASSESSMENTS:     LOWER EXTREMITY LANDMARK RIGHT eval 08/08/2022 08/13/2022 08/22/2022 08/29/2022 09/04/2022  At groin        30 cm proximal to suprapatella        20 cm proximal to suprapatella        10 cm proximal to suprapatella        At midpatella / popliteal crease 47.3       30 cm proximal to floor at lateral plantar foot 54.6 49.5 50 47.5 48 48.7  20 cm proximal to floor at lateral plantar foot 39.9 37.1 36.8 38.3 38.6 40  10 cm proximal to floor at lateral plantar foot 34.3 27.7 26.3 25.8 26.2 27  Circumference of ankle/heel        5 cm proximal to 1st MTP joint 27.0   23.2 23.9 25.6  Across MTP joint 24.3       Around proximal great toe 9.4   8.0 9.0 9.0  (Blank rows = not tested)  LOWER EXTREMITY LANDMARK LEFT eval 09/04/2022  At groin    30 cm proximal to suprapatella    20 cm proximal to suprapatella    10 cm proximal to suprapatella    At midpatella / popliteal crease 43.4   30 cm proximal to floor at lateral plantar foot 48.5 45.8  20 cm proximal to floor at lateral plantar foot 37.4 34.5  10 cm proximal to floor at lateral plantar foot 29.4 26  Circumference of ankle/heel    5 cm proximal to 1st MTP joint 24.5 23.5  Across MTP joint 23.4   Around proximal great toe 8.2 7.7  (Blank rows = not tested)    GAIT: Distance walked: to room and back to front office after rx Assistive device utilized: Environmental consultant - 4 wheeled Level of assistance: Modified independence Comments:  flexed posture, decreased heel strike toe off     TODAY'S TREATMENT   09/08/2022  Bilateral LE's washed with soap and water. Performed MLD to left LE, Supraclavicular, 5 Breaths, Left axillary and Inguinal LN's, left  lateral leg, medial to lateral and lateral leg, retracing pathways then lower leg retracing steps, and then foot and ankle retracing all steps and ending with LN's. Compression bandages applied: cocoa butter to entire LLE, applied TG soft,  2"elastomull to digits 1-5, artiflex foot to knee, 8 cm wrap foot to ankle with figure 8/s, 10 cm wrap with 2 heel locks and then up leg., last 10 cm from ankle to knee  09/04/2022 Pts velcro wraps on right removed and left LE wraps removed and legs remeasured. Bilateral LE's washed with soap and water. Performed MLD to left LE, Supraclavicular, 5 Breaths, Left axillary and Inguinal LN's, left  lateral leg, medial to lateral and lateral leg, retracing pathways then lower leg retracing steps, and then foot and ankle retracing all steps and ending with LN's. Compression bandages applied: cocoa butter to entire LLE, applied TG soft,  2"elastomull to digits 1-5,  artiflex foot to knee, 8 cm wrap foot to ankle with figure 8/s, 10 cm wrap with 2 heel locks and then up leg., last 10 cm from ankle to knee. Applied Right LE Stocking with velcro wraps to foot and ankle  09/01/2022  Tightened right LE velcro wrap slightly for pt.  Pts legs assisted onto table before and off table after treatment Performed MLD to left LE, Supraclavicular, 5 Breaths, Left axillary and Inguinal LN's, left  lateral leg, medial to lateral and lateral leg, retracing pathways then lower leg retracing steps, and then foot and ankle retracing all steps and ending with LN's. Compression bandages applied: cocoa butter to entire LLE, applied TG soft,  2"elastomull to digits 1-5, artiflex foot to knee, 8 cm wrap foot to ankle with figure 8/s, 10 cm wrap with 2 heel locks and then up  leg., last 10 cm from ankle to knee.  Large TG soft from below knee to groin folded at top .   08/29/2022 PT arrived with her grand daughter Lauren Santana Pts velcro wraps were removed and they were shown how to anchor velcro to keep it out of the way  Pt was remeasured with only slight increases since wearing the wraps, but noted swelling at mid ankle. Pt was sitting with her leg on table and donned undersock independently. She then had her leg on the floor and was able to pull the Juzo wrap up and was able to anchor all straps with some difficulty with longer straps. She had a little more difficulty with the foot piece and getting it tight. Wraps were removed again and pts grand daughter applied wraps. Educated her to start at the bottom and showed her how to pull both straps for increased pressure. She did very well and has a good understanding of how to reverse the colors prn. We also discussed exs the pt should be doing and she admitted she has sitting and standing exs that she has not been doing and showed me all of them. She was advised to return to her exercises since her legs are very weak.  08/27/2022 Pts wraps removed and leg washed Velcro wraps removed from boxes and velcro  tabs applied for pt to wear black side out at her request. Pt was given the mildly compressive undersock to apply with a little Assist of PT which she did with her leg outstretched on the treatment table. She was instructed in proper way to don the velcro wrap over the sock and she did so the first time with some assist and the next time alone. She was able to don the wrap with liner by herself with some difficulty mainly because of trouble reaching the lower leg. The foot piece was also applied first by PT and next by pt with occasional assist. Before leaving pts. son arrived and I explained to him and showed him the top straps and how they are pulled to get good pressure. They are a bit of a challege for pt because of difficulty  reaching her foot and lower leg strap. 2 of the straps are long and reach behind her leg, however the garment in a smaller size will probably cause the liner to be too tight for pt. We will let pt wear the garment and remove prn and she will have her son and grand daughter come on Friday to practice also. Pt wants to wait until next week to wrap her left leg.   PATIENT EDUCATION:  Education details: Treatment  plan, types of compression, footwear, Person educated: Patient and Child(ren),son Lauren Santana Education method: Explanation Education comprehension: verbalized understanding   HOME EXERCISE PROGRAM: NA  ASSESSMENT:  CLINICAL IMPRESSION: Pt. came in unwrapped on left but with Juzo Velcro Wraps on right LE. Right ankle still visible very swollen. Left leg with increased visible swelling also due to being out of wraps. Pt will come in wrapped next visit for measuing and hope to measure for velcro wraps on left also if leg appears ready.  OBJECTIVE IMPAIRMENTS decreased activity tolerance, difficulty walking, and increased edema.   We will initiate left LE wrapping next week. ACTIVITY LIMITATIONS carrying, standing, stairs, bed mobility, and locomotion levelactivities requiring standing, stairs, extended walking  PARTICIPATION LIMITATIONS:  as above  PERSONAL FACTORS Age, Time since onset of injury/illness/exacerbation, and 1-2 comorbidities: Lymphoma, prioer DVT/PE  are also affecting patient's functional outcome.   REHAB POTENTIAL: Excellent  CLINICAL DECISION MAKING: Stable/uncomplicated  EVALUATION COMPLEXITY: Low  GOALS: Goals reviewed with patient? Yes  SHORT TERM GOALS: Target date: 09/29/2022    Pt will have decreased edema at 10 cm prox to floor by atleast 2.5 cm Baseline LEFT 29.4, RIGHT 34.3 Goal status: MET right 08/08/2022  2.  Pt will have decreased edema at 30 cm prox to floor by 3 cm Baseline: Left 54.6, Right 48.5 Goal status: MET right 08/13/2022  3.  Pts.  Family member will be independent in compression bandaging so pt may bathe at home. Baseline: dependent Goal status: Instructed but has not performed  LONG TERM GOALS: Target date: 10/20/2022    Pt will have decreased edema at 10 cm prox to floor by 4 cm Baseline: left 37.4, Right 34/3 Goal status: MET right 08/22/2022  2.  Pt will have edema reduction at 30 cm prox to floor by 5 cm Baseline: Left 54.6, right 48.5 Goal status: MET Right 08/22/2022  3.  Pt will be fit with appropriate compression garments;likely velcro wrap, and will be independent in their use Baseline:  Goal status: MET for right but still awaiting garments  4.  Pt will have flexitouch demo to determine her desire for having the pump to assist with swelling Baseline:  Goal status: Deferred. Pt not interested   PLAN: PT FREQUENCY: 3x/week  PT DURATION: 6 weeks  PLANNED INTERVENTIONS: Therapeutic exercises, Patient/Family education, Self Care, Orthotic/Fit training, Manual lymph drainage, Compression bandaging, Vasopneumatic device, Manual therapy, and Re-evaluation  PLAN FOR NEXT SESSION:  Pt NOT interested in Flexitouch, continue MLD and wrapping, Initiate Left LE wrapping and continue with Right Juzo wraps Measure left for velcro next week if ready. PHYSICAL THERAPY DISCHARGE SUMMARY  Visits from Start of Care: 15  Current functional level related to goals / functional outcomes: Pt had partially met goals. She had good edema reduction of the right and had been fit for velcro compression wraps. We were still working on the left LE. She had other health issues for which she was treated and did not return for therapy.   Remaining deficits: Pt had difficulty donning garments on her own. She continued with complaints of leg pain and was seen at the hospital. She did not return to therapy   Education / Equipment: Velcro compression wrap right LE   Patient agrees to discharge. Patient goals were partially met.  Patient is being discharged due to a change in medical status.   Claris Pong, PT 09/08/2022, 2:58 PM

## 2022-09-09 ENCOUNTER — Other Ambulatory Visit: Payer: Self-pay

## 2022-09-09 ENCOUNTER — Encounter (HOSPITAL_COMMUNITY): Payer: Self-pay | Admitting: *Deleted

## 2022-09-09 ENCOUNTER — Emergency Department (HOSPITAL_COMMUNITY)
Admission: EM | Admit: 2022-09-09 | Discharge: 2022-09-09 | Disposition: A | Discharge status: Home / Self Care | Payer: Medicare Other | Attending: Emergency Medicine | Admitting: Emergency Medicine

## 2022-09-09 ENCOUNTER — Emergency Department (HOSPITAL_COMMUNITY): Payer: Medicare Other

## 2022-09-09 DIAGNOSIS — L89309 Pressure ulcer of unspecified buttock, unspecified stage: Secondary | ICD-10-CM | POA: Diagnosis not present

## 2022-09-09 DIAGNOSIS — Z79899 Other long term (current) drug therapy: Secondary | ICD-10-CM | POA: Diagnosis not present

## 2022-09-09 DIAGNOSIS — M79605 Pain in left leg: Secondary | ICD-10-CM | POA: Diagnosis not present

## 2022-09-09 DIAGNOSIS — M19012 Primary osteoarthritis, left shoulder: Secondary | ICD-10-CM | POA: Diagnosis not present

## 2022-09-09 DIAGNOSIS — M79604 Pain in right leg: Secondary | ICD-10-CM | POA: Insufficient documentation

## 2022-09-09 DIAGNOSIS — R5381 Other malaise: Secondary | ICD-10-CM

## 2022-09-09 MED ORDER — LIDOCAINE 5 % EX PTCH
1.0000 | MEDICATED_PATCH | CUTANEOUS | Status: DC
  Administered 2022-09-09: 1 via TRANSDERMAL
  Filled 2022-09-09: qty 1

## 2022-09-09 MED ORDER — "MEPILEX AG 4""X4"" EX PADS": 1.0000 "application " | MEDICATED_PAD | Freq: Every day | CUTANEOUS | 0 refills | Status: DC

## 2022-09-09 MED ORDER — TRAMADOL HCL 50 MG PO TABS
50.0000 mg | ORAL_TABLET | Freq: Once | ORAL | Status: AC
  Administered 2022-09-09: 50 mg via ORAL
  Filled 2022-09-09: qty 1

## 2022-09-09 MED ORDER — LIDOCAINE 5 % EX PTCH: 1.0000 | MEDICATED_PATCH | CUTANEOUS | 0 refills | Status: DC

## 2022-09-09 NOTE — Evaluation (Signed)
Physical Therapy Evaluation Patient Details Name: Lauren Santana MRN: [id number] DOB: 07-Sep-1951 Today's Date: 09/09/2022  History of Present Illness  71 y.o. female, history of diffuse large B-cell lymphoma, L TKA 2015, PE, T10/T11 fractures who presents to the ED for several complaints.  Clinical Impression  Pt seen in ED.  Pt currently with functional limitations due to the deficits listed below (see PT Problem List). Pt will benefit from skilled PT to increase their independence and safety with mobility to allow discharge to the venue listed below.  Pt reports she had lymphedema wrapping yesterday (OPPT) however was unable to tolerate and not able to mobilize so she removed dressing/wrapping.  Pt able to ambulate within room today with min/guard assist for safety.  Pt's son also present.  Pt declines SNF and requests some more assist at home.   Pt would benefit from HHPT.  Recommended pt f/u with OPPT on lymphedema management eventually since she plans to not wear wrappings any longer.        Recommendations for follow up therapy are one component of a multi-disciplinary discharge planning process, led by the attending physician.  Recommendations may be updated based on patient status, additional functional criteria and insurance authorization.  Follow Up Recommendations Home health PT      Assistance Recommended at Discharge Intermittent Supervision/Assistance  Patient can return home with the following  A little help with bathing/dressing/bathroom;Assistance with cooking/housework;Help with stairs or ramp for entrance    Equipment Recommendations None recommended by PT  Recommendations for Other Services       Functional Status Assessment Patient has had a recent decline in their functional status and demonstrates the ability to make significant improvements in function in a reasonable and predictable amount of time.     Precautions / Restrictions Precautions Precautions:  Fall      Mobility  Bed Mobility Overal bed mobility: Needs Assistance Bed Mobility: Supine to Sit, Sit to Supine     Supine to sit: Supervision Sit to supine: Supervision   General bed mobility comments: supervision for safety, increased time    Transfers Overall transfer level: Needs assistance Equipment used: Rolling walker (2 wheels) Transfers: Sit to/from Stand Sit to Stand: Min guard, From elevated surface           General transfer comment: min/guard for safety, from ED stretcher    Ambulation/Gait Ambulation/Gait assistance: Min guard Gait Distance (Feet): 25 Feet Assistive device: Rolling walker (2 wheels) Gait Pattern/deviations: Step-through pattern, Decreased stride length, Trunk flexed       General Gait Details: ambulated within room, forward trunk flexion at baseline from endometriosis per pt, limited distance due to weakness per pt  Stairs            Wheelchair Mobility    Modified Rankin (Stroke Patients Only)       Balance Overall balance assessment: Mild deficits observed, not formally tested (denies any recent falls)                                           Pertinent Vitals/Pain Pain Assessment Pain Assessment: Faces Faces Pain Scale: Hurts little more Pain Location: R groin pain Pain Descriptors / Indicators: Sore Pain Intervention(s): Repositioned, Monitored during session    Home Living Family/patient expects to be discharged to:: Private residence Living Arrangements: Alone Available Help at Discharge: Family;Available PRN/intermittently Type of Home: [place]  Access: Level entry       Home Layout: One level Home Equipment: Rollator (4 wheels)      Prior Function Prior Level of Function : Independent/Modified Independent             Mobility Comments: typically ambulatory with rollator       Hand Dominance        Extremity/Trunk Assessment   Upper Extremity  Assessment Upper Extremity Assessment: Generalized weakness    Lower Extremity Assessment Lower Extremity Assessment: Generalized weakness (bil lymphedema, no dressing/wrapping at time of evaluation)       Communication   Communication: No difficulties  Cognition Arousal/Alertness: Awake/alert Behavior During Therapy: WFL for tasks assessed/performed Overall Cognitive Status: Within Functional Limits for tasks assessed                                          General Comments      Exercises     Assessment/Plan    PT Assessment Patient needs continued PT services  PT Problem List Decreased activity tolerance;Decreased strength;Decreased knowledge of use of DME;Decreased mobility;Obesity       PT Treatment Interventions Gait training;DME instruction;Therapeutic activities;Therapeutic exercise;Functional mobility training;Balance training;Patient/family education    PT Goals (Current goals can be found in the Care Plan section)  Acute Rehab PT Goals PT Goal Formulation: With patient Time For Goal Achievement: 09/23/22 Potential to Achieve Goals: Good    Frequency Min 3X/week     Co-evaluation               AM-PAC PT "6 Clicks" Mobility  Outcome Measure Help needed turning from your back to your side while in a flat bed without using bedrails?: A Little Help needed moving from lying on your back to sitting on the side of a flat bed without using bedrails?: A Little Help needed moving to and from a bed to a chair (including a wheelchair)?: A Little Help needed standing up from a chair using your arms (e.g., wheelchair or bedside chair)?: A Little Help needed to walk in hospital room?: A Little Help needed climbing 3-5 steps with a railing? : A Lot 6 Click Score: 17    End of Session Equipment Utilized During Treatment: Gait belt Activity Tolerance: Patient tolerated treatment well Patient left: in bed;with call bell/phone within reach;with  family/visitor present   PT Visit Diagnosis: Difficulty in walking, not elsewhere classified (R26.2);Muscle weakness (generalized) (M62.81)    Time: 2878-6767 PT Time Calculation (min) (ACUTE ONLY): 20 min   Charges:   PT Evaluation $PT Eval Low Complexity: 1 Low         Kati PT, DPT Physical Therapist Acute Rehabilitation Services Preferred contact method: Secure Chat Weekend Pager Only: [phone] Office: [place] 09/09/2022, 4:13 PM

## 2022-09-09 NOTE — Discharge Instructions (Addendum)
You were seen in the emergency department today for swelling of your legs and difficulty with walking.  You were evaluated by physical therapy who recommends that you have home health.  I have sent a referral to have home health and physical therapy set up in your home.  I have also sent a prescription for lidocaine patches for your back and Mepilex pads for your bottom.  Please use as prescribed.  Return for emergencies.

## 2022-09-09 NOTE — Progress Notes (Addendum)
Awaiting PT eval.   Addend @ 11:38 AM RN CM, Rosendo Gros, contacted this CSW to inform to contact the pt's son. This CSW contacted the pt's son who inquired about HH PT. Per the chart, there was no notes of HH PT being set up. This CSW contacted [facility] who stated they are able to assist the pt. Caryl Pina, with [facility], requested a new [place] PT order to be put in by the EDP. This CSW contacted EDP, Dr. Ashok Cordia to request the new [facility] PT order. TOC following.   Addend @ 12:08 PM EDP, Dr. Ashok Cordia placed [facility] PT order. Caryl Pina with [facility] and the pt's son notified. TOC signing off.   Addend @ 1:05 PM This CSW was contacted by the pt's son stating "I believe my mom needs to be in a SNF receiving therapy. She is unable to walk independently at this time." This CSW contacted [place] supervisor Olga Coaster, to staff, who informed this CSW to let the pt's son know to place his mother through her PCP Dr. Criss Rosales.   This CSW informed the pt's son of the above information and told him that if his mother were in the hospital, we would assist with placement; however, due to her being discharged, we recommend they go through her PCP to have her placed at this time. TOC signing off.

## 2022-09-09 NOTE — ED Triage Notes (Addendum)
Pt presents with leg pain and swelling, left shoulder pain (chronic) and rt side discomfort. Also states someone needs to look at her butt.

## 2022-09-09 NOTE — ED Provider Notes (Signed)
[facility] DEPT Provider Note   CSN: [id number] Arrival date & time: 09/09/22  1136     History  Chief Complaint  Patient presents with   Leg Pain    Lauren Santana is a 71 y.o. female, history of diffuse large B-cell lymphoma, who presents to the ED for several complaints.  She states that she is concerned because of her buttocks have been hurting and her doctor told her to put some cream on it, and she has been unable to given that she lives alone and cannot reach that area.  She denies any rash, or sores in that area, but she states it is tender to the touch.  Also complains of bilateral painful legs.  Wears lymphedema wraps, and has been going to lymphedema clinic for several months now, she states her right leg has been wrapped more than her left, and that the right leg is weaker than the left, and this has been gradual.  States she has not been doing her exercises or that her leg strength, and that she thinks this may be causing it.  Denies any trauma to the area.  Also is here for her left shoulder pain has been going on for several months.  States that it is aching in nature, movement makes it worse.  Has not tried anything for it.  States her friends think it is arthritis.     Home Medications Prior to Admission medications   Medication Sig Start Date End Date Taking? Authorizing Provider  diclofenac Sodium (VOLTAREN) 1 % GEL Apply 2 g topically daily as needed (pain).  08/11/20   [provider]  gabapentin (NEURONTIN) 300 MG capsule Take 1 capsule (300 mg total) by mouth 3 (three) times daily. 08/21/20   Nita Sells, MD  Lactobacillus (PROBIOTIC ACIDOPHILUS) CAPS Take 1 tablet by mouth in the morning and at bedtime. 08/23/20   Mercy Riding, MD  mupirocin ointment (BACTROBAN) 2 % APPLY TO AFFECTED AREA TWICE A DAY 07/12/21   Trula Slade, DPM  ondansetron (ZOFRAN) 4 MG tablet Take 1 tablet (4 mg total) by mouth 2 (two)  times daily as needed for nausea. Patient not taking: Reported on 07/21/2022 07/25/20   Dwyane Dee, MD  potassium chloride SA (KLOR-CON) 20 MEQ tablet Take 2 tablets (40 mEq total) by mouth daily. 08/22/20   Nita Sells, MD  prochlorperazine (COMPAZINE) 5 MG tablet Take 1 tablet (5 mg total) by mouth every 6 (six) hours as needed for nausea or vomiting. Patient not taking: Reported on 07/21/2022 08/21/20   Nita Sells, MD  rivaroxaban (XARELTO) 20 MG TABS tablet Take 1 tablet (20 mg total) by mouth daily with supper. 08/23/20   Mercy Riding, MD      Allergies    Tylenol [acetaminophen] and Sulfa antibiotics    Review of Systems   Review of Systems  Musculoskeletal:        +L shoulder pain. Bilateral leg pain.   Skin:  Positive for wound. Negative for rash.    Physical Exam Updated Vital Signs BP (!) 155/65 (BP Location: Right Arm)   Pulse 86   Temp 98.2 F (36.8 C) (Oral)   Resp 15   Ht '5\' 2"'$  (1.575 m)   Wt 88 kg   LMP  (LMP Unknown)   SpO2 100%   BMI 35.48 kg/m  Physical Exam Constitutional:      General: She is not in acute distress.    Appearance: She is  obese.  HENT:     Nose: Nose normal.     Mouth/Throat:     Mouth: Mucous membranes are moist.  Eyes:     Extraocular Movements: Extraocular movements intact.     Conjunctiva/sclera: Conjunctivae normal.     Pupils: Pupils are equal, round, and reactive to light.  Cardiovascular:     Rate and Rhythm: Normal rate and regular rhythm.     Pulses: Normal pulses.  Pulmonary:     Effort: Pulmonary effort is normal.     Breath sounds: Normal breath sounds.  Abdominal:     General: Abdomen is flat.     Palpations: Abdomen is soft.  Musculoskeletal:     Comments: Nonpitting edema bilateral lower extremities.  Equal in size.  Pulses present.  L shoulder: Range of motion intact.  Tenderness to palpation of superior aspect of scapula.  No crepitus, edema noted.  Landmarks intact.  Bilateral upper extremity  strength equal.  Skin:    General: Skin is warm and dry.     Capillary Refill: Capillary refill takes less than 2 seconds.  Neurological:     General: No focal deficit present.     Mental Status: She is alert and oriented to person, place, and time.     ED Results / Procedures / Treatments   Labs (all labs ordered are listed, but only abnormal results are displayed) Labs Reviewed - No data to display  EKG None  Radiology No results found.  Procedures Procedures   Medications Ordered in ED Medications - No data to display  ED Course/ Medical Decision Making/ A&P                           Medical Decision Making Amount and/or Complexity of Data Reviewed Radiology: ordered.  Risk Prescription drug management.   Patient is a 71 year old female, here for multiple complaints, shoulder pain likely secondary to arthritis.  Reviewed x-ray, which shows moderate degenerative changes.  Coccyx pain likely secondary to skin breakdown, from incontinence versus sitting for long periods of time.  Recommend Mepilex.  Appropriate skin care.  Also here for weakness of bilateral lower extremities and discomfort.  They are equal in size, pulses equal.  She notes that she has not been using her legs much, and has been sitting around much.  She is requesting a physical therapy evaluation, as she is concerned she may not be able to go home..  PT eval ordered, prior to coming here she has been able to ambulate by herself with a rollator.  Care transitioned to [facility].  Final Clinical Impression(s) / ED Diagnoses Final diagnoses:  None    Rx / DC Orders ED Discharge Orders     None         Tayjah Lobdell, Si Gaul, PA 09/09/22 1528    Sherwood Gambler, MD 09/10/22 [phone]

## 2022-09-09 NOTE — ED Provider Notes (Signed)
Care of patient handed off to me at this time by [employer], PA-C.  Please see her note for work-up.  Briefly this is a 71 year old female who presents to the emergency department with several complaints.  Notably she has had bilateral lower extremity pain.  She has a history of lymphedema and wears wraps for this.  She states that the right leg has been wrapped more than the left and the right leg has become weaker than the left over time.  States that she has also not been doing her exercises. Physical Exam  BP (!) 155/65 (BP Location: Right Arm)   Pulse 86   Temp 98.2 F (36.8 C) (Oral)   Resp 15   Ht '5\' 2"'$  (1.575 m)   Wt 88 kg   LMP  (LMP Unknown)   SpO2 100%   BMI 35.48 kg/m   Physical Exam Vitals and nursing note reviewed.  HENT:     Head: Normocephalic and atraumatic.  Eyes:     General: No scleral icterus. Pulmonary:     Effort: Pulmonary effort is normal. No respiratory distress.  Skin:    Findings: No rash.  Neurological:     General: No focal deficit present.     Mental Status: She is alert and oriented to person, place, and time.  Psychiatric:        Mood and Affect: Mood normal.        Behavior: Behavior normal.        Thought Content: Thought content normal.        Judgment: Judgment normal.    Procedures  Procedures  ED Course / MDM    Medical Decision Making Amount and/or Complexity of Data Reviewed Radiology: ordered.  Risk Prescription drug management.   During her stay she had complained of shoulder pain and received an x-ray which showed chronic arthritic changes.  PT evaluated the patient and recommended home health as well as home physical therapy.  I placed a transitional care order for these as well as placed a face-to-face order for home health and home physical therapy.  I discussed this with the patient and her son at bedside.  They verbalized understanding.  We will also prescribe mepilex pads as well as lidocaine patches.  Discussed  return precautions.  They verbalized understanding.  They are agreeable to plan.  Feel that she is overall safe for discharge at this time.          Mickie Hillier, PA-C 09/09/22 1731    Valarie Merino, MD 09/10/22 [phone]

## 2022-09-10 ENCOUNTER — Ambulatory Visit: Payer: Medicare Other

## 2022-09-12 ENCOUNTER — Ambulatory Visit: Payer: Medicare Other | Admitting: Rehabilitation

## 2022-09-12 ENCOUNTER — Telehealth (HOSPITAL_COMMUNITY): Payer: Self-pay | Admitting: Emergency Medicine

## 2022-09-12 NOTE — Telephone Encounter (Signed)
TOC/SW requests home health order be placed - order placed

## 2022-09-13 ENCOUNTER — Emergency Department (HOSPITAL_COMMUNITY)
Admission: EM | Admit: 2022-09-13 | Discharge: 2022-09-15 | Disposition: A | Discharge status: Home / Self Care | Payer: Medicare Other | Source: Home / Self Care | Attending: Emergency Medicine | Admitting: Emergency Medicine

## 2022-09-13 ENCOUNTER — Other Ambulatory Visit: Payer: Self-pay

## 2022-09-13 ENCOUNTER — Encounter (HOSPITAL_BASED_OUTPATIENT_CLINIC_OR_DEPARTMENT_OTHER): Payer: Self-pay | Admitting: Emergency Medicine

## 2022-09-13 ENCOUNTER — Encounter (HOSPITAL_COMMUNITY): Payer: Self-pay

## 2022-09-13 ENCOUNTER — Emergency Department (HOSPITAL_COMMUNITY)
Admission: EM | Admit: 2022-09-13 | Discharge: 2022-09-13 | Discharge status: Against Medical Advice | Payer: Medicare Other | Attending: Emergency Medicine | Admitting: Emergency Medicine

## 2022-09-13 DIAGNOSIS — Z79899 Other long term (current) drug therapy: Secondary | ICD-10-CM | POA: Insufficient documentation

## 2022-09-13 DIAGNOSIS — M79604 Pain in right leg: Secondary | ICD-10-CM | POA: Insufficient documentation

## 2022-09-13 DIAGNOSIS — Z7901 Long term (current) use of anticoagulants: Secondary | ICD-10-CM | POA: Insufficient documentation

## 2022-09-13 DIAGNOSIS — M7989 Other specified soft tissue disorders: Secondary | ICD-10-CM | POA: Diagnosis not present

## 2022-09-13 DIAGNOSIS — R600 Localized edema: Secondary | ICD-10-CM | POA: Insufficient documentation

## 2022-09-13 DIAGNOSIS — Z5321 Procedure and treatment not carried out due to patient leaving prior to being seen by health care provider: Secondary | ICD-10-CM | POA: Insufficient documentation

## 2022-09-13 DIAGNOSIS — R269 Unspecified abnormalities of gait and mobility: Secondary | ICD-10-CM | POA: Diagnosis not present

## 2022-09-13 DIAGNOSIS — R262 Difficulty in walking, not elsewhere classified: Secondary | ICD-10-CM

## 2022-09-13 DIAGNOSIS — M79605 Pain in left leg: Secondary | ICD-10-CM | POA: Insufficient documentation

## 2022-09-13 DIAGNOSIS — I10 Essential (primary) hypertension: Secondary | ICD-10-CM | POA: Insufficient documentation

## 2022-09-13 MED ORDER — RIVAROXABAN 20 MG PO TABS
20.0000 mg | ORAL_TABLET | Freq: Every day | ORAL | Status: DC
  Administered 2022-09-13 – 2022-09-14 (×2): 20 mg via ORAL
  Filled 2022-09-13 (×2): qty 1

## 2022-09-13 MED ORDER — OXYCODONE HCL 5 MG PO TABS
5.0000 mg | ORAL_TABLET | Freq: Once | ORAL | Status: AC
  Administered 2022-09-13: 5 mg via ORAL
  Filled 2022-09-13: qty 1

## 2022-09-13 NOTE — ED Notes (Signed)
Purewick replaced per pt request

## 2022-09-13 NOTE — ED Notes (Signed)
Purewick applied per pt request.

## 2022-09-13 NOTE — ED Notes (Signed)
Pt placed in room with son bedside, given warm blankets, and placed on monitor.

## 2022-09-13 NOTE — ED Triage Notes (Signed)
Patient c/o bilateral leg swelling and pain. Patient states it is more difficult to walk. Patient states she is unable to use a rolling walker by herself now,. Patient was seen on 09/09/22 for the same.

## 2022-09-13 NOTE — ED Triage Notes (Signed)
Leg pain, unable to tolerate pain difficulty walking due to pain. Seen at lymphedema clinic. Feels pain has gotten worse since having legs wrapped at clinic. Seen for similar on 09/09/22. Left [facility] ed due to long wait today  Son is with her, states she is not able to be independent at this time

## 2022-09-13 NOTE — ED Provider Notes (Signed)
[facility] EMERGENCY DEPT Provider Note   CSN: [id number] Arrival date & time: 09/13/22  1857     History {Add pertinent medical, surgical, social history, OB history to HPI:1} Chief Complaint  Patient presents with   Leg Pain    HARRIS KISTLER is a 71 y.o. female.   Leg Pain      Home Medications Prior to Admission medications   Medication Sig Start Date End Date Taking? Authorizing Provider  diclofenac Sodium (VOLTAREN) 1 % GEL Apply 2 g topically daily as needed (pain).  08/11/20   [provider]  gabapentin (NEURONTIN) 300 MG capsule Take 1 capsule (300 mg total) by mouth 3 (three) times daily. 08/21/20   Nita Sells, MD  Lactobacillus (PROBIOTIC ACIDOPHILUS) CAPS Take 1 tablet by mouth in the morning and at bedtime. 08/23/20   Mercy Riding, MD  lidocaine (LIDODERM) 5 % Place 1 patch onto the skin daily. Remove & Discard patch within 12 hours or as directed by MD 09/09/22   Mickie Hillier, PA-C  mupirocin ointment (BACTROBAN) 2 % APPLY TO AFFECTED AREA TWICE A DAY 07/12/21   Trula Slade, DPM  ondansetron (ZOFRAN) 4 MG tablet Take 1 tablet (4 mg total) by mouth 2 (two) times daily as needed for nausea. Patient not taking: Reported on 07/21/2022 07/25/20   Dwyane Dee, MD  potassium chloride SA (KLOR-CON) 20 MEQ tablet Take 2 tablets (40 mEq total) by mouth daily. 08/22/20   Nita Sells, MD  prochlorperazine (COMPAZINE) 5 MG tablet Take 1 tablet (5 mg total) by mouth every 6 (six) hours as needed for nausea or vomiting. Patient not taking: Reported on 07/21/2022 08/21/20   Nita Sells, MD  rivaroxaban (XARELTO) 20 MG TABS tablet Take 1 tablet (20 mg total) by mouth daily with supper. 08/23/20   Mercy Riding, MD  Silver (MEPILEX AG) [phone]" PADS Apply 1 application  topically daily. 09/09/22   Mickie Hillier, PA-C      Allergies    Tylenol [acetaminophen] and Sulfa antibiotics    Review of Systems   Review of  Systems  Physical Exam Updated Vital Signs BP (!) 163/63   Pulse 79   Temp (!) 97.3 F (36.3 C)   Resp (!) 21   LMP  (LMP Unknown)   SpO2 99%  Physical Exam  ED Results / Procedures / Treatments   Labs (all labs ordered are listed, but only abnormal results are displayed) Labs Reviewed - No data to display  EKG None  Radiology No results found.  Procedures Procedures  {Document cardiac monitor, telemetry assessment procedure when appropriate:1}  Medications Ordered in ED Medications - No data to display  ED Course/ Medical Decision Making/ A&P                           Medical Decision Making   CENIYA FOWERS is a 71 y.o. female with a past medical history significant for hypertension, previous pulm embolism, hypercholesterolemia, endometriosis, previous hysterectomy, atrial fibrillation on Xarelto, diffuse large B-cell lymphoma, and previous chronic lower extremity edema who presents for continued difficulty with ambulation and unchanged leg swelling.  According to patient, she was seen several days ago for chronic leg swelling and difficulty mobility.  This was 5 days ago.  Patient had evaluation that was overall reassuring but then waited to see physical therapy.  Patient reportedly told him that she went to go home with home health and that  was the disposition plan.  After going home she reports she has not been able to get up or ambulate and her swelling is unchanged.  She has had no new falls or injuries but reports that she is feeling at being at home alone and does not feel she can wait until late next week week for physical therapy to come.  She reports that she tried to treat this as an outpatient but does not feel she can do so successfully anymore.  On my exam, patient does have edema in both legs.  She reports is unchanged for months.  He does have intact sensation and was able to lift them slightly.  She reports there is painful bilaterally which is chronic.   She reports the home pain medicine is not helping.  Lungs otherwise clear and chest nontender.  Abdomen nontender.  No focal neurologic deficits initially.  Given the patient's lack of new injury new trauma or anything changing, we agreed to hold on lab work-up or imaging at this time.  This was offered however as she reports that she returned because she is not able to be at home due to her inability to ambulate with the chronic leg swelling, I agree to hold on more diagnostic work-up at this time.  I do however feel that as she is failing with managing at home, will call transition of care and see what can be done.  I spoke to the transition of care provider overnight who says that she will need to be transferred ED to ED to [facility] and be reassessed by physical therapy to discuss placement to a skilled nursing facility.  Will do ED to ED transfer to see physical therapy to help facilitate likely placement.  Again, with her lack of any new symptoms, we will hold on medical work-up at this time.  Patient agrees with this plan.     {Document critical care time when appropriate:1} {Document review of labs and clinical decision tools ie heart score, Chads2Vasc2 etc:1}  {Document your independent review of radiology images, and any outside records:1} {Document your discussion with family members, caretakers, and with consultants:1} {Document social determinants of health affecting pt's care:1} {Document your decision making why or why not admission, treatments were needed:1} Final Clinical Impression(s) / ED Diagnoses Final diagnoses:  None    Rx / DC Orders ED Discharge Orders     None

## 2022-09-13 NOTE — ED Notes (Signed)
Pure wick removed at patient's request

## 2022-09-14 DIAGNOSIS — I10 Essential (primary) hypertension: Secondary | ICD-10-CM | POA: Diagnosis not present

## 2022-09-14 DIAGNOSIS — Z79899 Other long term (current) drug therapy: Secondary | ICD-10-CM | POA: Diagnosis not present

## 2022-09-14 DIAGNOSIS — Z5321 Procedure and treatment not carried out due to patient leaving prior to being seen by health care provider: Secondary | ICD-10-CM | POA: Diagnosis not present

## 2022-09-14 DIAGNOSIS — M7989 Other specified soft tissue disorders: Secondary | ICD-10-CM | POA: Diagnosis not present

## 2022-09-14 DIAGNOSIS — M79605 Pain in left leg: Secondary | ICD-10-CM | POA: Diagnosis not present

## 2022-09-14 DIAGNOSIS — M79604 Pain in right leg: Secondary | ICD-10-CM | POA: Diagnosis not present

## 2022-09-14 DIAGNOSIS — Z7901 Long term (current) use of anticoagulants: Secondary | ICD-10-CM | POA: Diagnosis not present

## 2022-09-14 DIAGNOSIS — R600 Localized edema: Secondary | ICD-10-CM | POA: Diagnosis not present

## 2022-09-14 LAB — BASIC METABOLIC PANEL
Anion gap: 5 (ref 5–15)
BUN: 6 mg/dL — ABNORMAL LOW (ref 8–23)
CO2: 30 mmol/L (ref 22–32)
Calcium: 8.7 mg/dL — ABNORMAL LOW (ref 8.9–10.3)
Chloride: 108 mmol/L (ref 98–111)
Creatinine, Ser: 0.65 mg/dL (ref 0.44–1.00)
GFR, Estimated: >60 mL/min (ref >60)
Glucose, Bld: 78 mg/dL (ref 70–99)
Potassium: 3.3 mmol/L — ABNORMAL LOW (ref 3.5–5.1)
Sodium: 143 mmol/L (ref 135–145)

## 2022-09-14 LAB — CBC WITH DIFFERENTIAL/PLATELET
Abs Immature Granulocytes: 0.01 10*3/uL (ref 0.00–0.07)
Basophils Absolute: 0 10*3/uL (ref 0.0–0.1)
Basophils Relative: 1 %
Eosinophils Absolute: 0.3 10*3/uL (ref 0.0–0.5)
Eosinophils Relative: 7 %
HCT: 38.7 % (ref 36.0–46.0)
Hemoglobin: 12 g/dL (ref 12.0–15.0)
Immature Granulocytes: 0 %
Lymphocytes Relative: 45 %
Lymphs Abs: 1.8 10*3/uL (ref 0.7–4.0)
MCH: 32.1 pg (ref 26.0–34.0)
MCHC: 31 g/dL (ref 30.0–36.0)
MCV: 103.5 fL — ABNORMAL HIGH (ref 80.0–100.0)
Monocytes Absolute: 0.4 10*3/uL (ref 0.1–1.0)
Monocytes Relative: 11 %
Neutro Abs: 1.4 10*3/uL — ABNORMAL LOW (ref 1.7–7.7)
Neutrophils Relative %: 36 %
Platelets: 100 10*3/uL — ABNORMAL LOW (ref 150–400)
RBC: 3.74 MIL/uL — ABNORMAL LOW (ref 3.87–5.11)
RDW: 12.9 % (ref 11.5–15.5)
WBC: 3.9 10*3/uL — ABNORMAL LOW (ref 4.0–10.5)
nRBC: 0 % (ref 0.0–0.2)

## 2022-09-14 LAB — URINALYSIS, ROUTINE W REFLEX MICROSCOPIC
Bilirubin Urine: NEGATIVE
Glucose, UA: NEGATIVE mg/dL
Ketones, ur: NEGATIVE mg/dL
Nitrite: NEGATIVE
Protein, ur: NEGATIVE mg/dL
Specific Gravity, Urine: 1.015 (ref 1.005–1.030)
pH: 8 (ref 5.0–8.0)

## 2022-09-14 LAB — URINALYSIS, MICROSCOPIC (REFLEX): WBC, UA: >50 WBC/hpf (ref 0–5)

## 2022-09-14 MED ORDER — GABAPENTIN 300 MG PO CAPS
300.0000 mg | ORAL_CAPSULE | Freq: Three times a day (TID) | ORAL | Status: DC
  Administered 2022-09-14 – 2022-09-15 (×2): 300 mg via ORAL
  Filled 2022-09-14 (×2): qty 1

## 2022-09-14 MED ORDER — PANTOPRAZOLE SODIUM 40 MG PO TBEC
40.0000 mg | DELAYED_RELEASE_TABLET | Freq: Every day | ORAL | Status: DC
  Administered 2022-09-14 – 2022-09-15 (×2): 40 mg via ORAL
  Filled 2022-09-14 (×2): qty 1

## 2022-09-14 MED ORDER — FUROSEMIDE 40 MG PO TABS
20.0000 mg | ORAL_TABLET | Freq: Every day | ORAL | Status: DC
  Administered 2022-09-15: 20 mg via ORAL
  Filled 2022-09-14 (×2): qty 1

## 2022-09-14 MED ORDER — ONDANSETRON 4 MG PO TBDP
4.0000 mg | ORAL_TABLET | Freq: Once | ORAL | Status: AC
  Administered 2022-09-14: 4 mg via ORAL
  Filled 2022-09-14: qty 1

## 2022-09-14 MED ORDER — ATORVASTATIN CALCIUM 10 MG PO TABS
10.0000 mg | ORAL_TABLET | Freq: Every day | ORAL | Status: DC
  Administered 2022-09-14 – 2022-09-15 (×2): 10 mg via ORAL
  Filled 2022-09-14 (×2): qty 1

## 2022-09-14 MED ORDER — TRAMADOL HCL 50 MG PO TABS
50.0000 mg | ORAL_TABLET | Freq: Three times a day (TID) | ORAL | Status: DC | PRN
  Administered 2022-09-14: 50 mg via ORAL
  Filled 2022-09-14: qty 1

## 2022-09-14 MED ORDER — ONDANSETRON HCL 4 MG PO TABS: 4.0000 mg | ORAL_TABLET | Freq: Two times a day (BID) | ORAL | Status: DC | PRN

## 2022-09-14 MED ORDER — OXYCODONE HCL 5 MG PO TABS
5.0000 mg | ORAL_TABLET | Freq: Once | ORAL | Status: AC
  Administered 2022-09-14: 5 mg via ORAL
  Filled 2022-09-14: qty 1

## 2022-09-14 MED ORDER — SUCRALFATE 1 G PO TABS
1.0000 g | ORAL_TABLET | Freq: Three times a day (TID) | ORAL | Status: DC
  Administered 2022-09-14 – 2022-09-15 (×3): 1 g via ORAL
  Filled 2022-09-14 (×3): qty 1

## 2022-09-14 NOTE — ED Provider Notes (Signed)
  Patient signed out by Dr. Sherry Ruffing.  In brief presents with inability to achieve ADLs at home.  Was seen and evaluated several days ago for the same.  PT has not made it to her house.  Dr. Sherry Ruffing reengaged transition of care team who recommended transferring to [facility] or [facility] for repeat PT evaluation and TOC evaluation.  Patient had work-up several days ago and this was not repeated here.  I held the patient overnight at [employer].  Discussed with Dr. Roxanne Mins at [facility].  We will transfer for eval.  Physical Exam  BP (!) 140/69   Pulse 77   Temp (!) 97.3 F (36.3 C)   Resp (!) 23   LMP  (LMP Unknown)   SpO2 98%   Physical Exam  Procedures  Procedures  ED Course / MDM    Medical Decision Making Risk Prescription drug management.   Problem List Items Addressed This Visit   None Visit Diagnoses     Edema of both legs    -  Primary   Bilateral leg pain       Impaired ambulation                 Merryl Hacker, MD 09/14/22 [phone]

## 2022-09-14 NOTE — ED Notes (Signed)
Took pt to bathroom in recliner, 2 assist.

## 2022-09-14 NOTE — ED Notes (Signed)
Pt updated with plan of care and pending transport, pt given washcloth for face. Denies other needs at this time.

## 2022-09-14 NOTE — Progress Notes (Signed)
TOC CSW spoke with Veva Holes [phone].  CSW updated [name] on PT eval recommendations.  CSW was given permission to fax pts info out for bed offer.  [name] was informed by CSW about [email or web address].    Jeremaih Klima Tarpley-Carter, MSW, LCSW-A Pronouns:  She/Her/Hers [facility] Health[facility]itions of Care Clinical Social Worker Direct Number:  [phone] [email or web address]

## 2022-09-14 NOTE — Progress Notes (Addendum)
Pt has been faxed out for bed offers.  CSW currently awaiting bed offers.  HTN Woodward Tarpley-Carter, MSW, LCSW-A Pronouns:  She/Her/Hers [facility] Clinical Social Worker Direct Number:  [phone] [email or web address]

## 2022-09-14 NOTE — Evaluation (Signed)
Occupational Therapy Evaluation Patient Details Name: Lauren Santana MRN: [id number] DOB: [birth year] Today's Date: 09/14/2022   History of Present Illness 71 y.o. female, history of diffuse large B-cell lymphoma, L TKA 2015, PE, T10/T11 fractures who presents to ED with c/p bil LE pain/edema and inability to ambulate and self care.  Pt in ED 10/17 with similar report and with no improvement since that time   Clinical Impression   Pt admitted with the above. Pt currently with functional limitations due to the deficits listed below (see OT Problem List).  Pt will benefit from skilled OT to increase their safety and independence with ADL and functional mobility for ADL to facilitate discharge to venue listed below.   Pt is strugglingat home and will need SNF       Recommendations for follow up therapy are one component of a multi-disciplinary discharge planning process, led by the attending physician.  Recommendations may be updated based on patient status, additional functional criteria and insurance authorization.   Follow Up Recommendations  Skilled nursing-short term rehab (<3 hours/day)    Assistance Recommended at Discharge Set up Supervision/Assistance  Patient can return home with the following A little help with walking and/or transfers;A little help with bathing/dressing/bathroom    Functional Status Assessment  Patient has had a recent decline in their functional status and demonstrates the ability to make significant improvements in function in a reasonable and predictable amount of time.  Equipment Recommendations  BSC/3in1    Recommendations for Other Services       Precautions / Restrictions Precautions Precautions: Fall Restrictions Weight Bearing Restrictions: No      Mobility Bed Mobility Overal bed mobility: Needs Assistance Bed Mobility: Supine to Sit, Sit to Supine     Supine to sit: Min guard Sit to supine: Mod assist   General bed mobility  comments: min guard for safety to EOB but assist to return bil LEs up onto bed    Transfers Overall transfer level: Needs assistance Equipment used: Rolling walker (2 wheels) Transfers: Sit to/from Stand Sit to Stand: Min assist, From elevated surface           General transfer comment: Steady assist to stand from high bed      Balance Overall balance assessment: Needs assistance Sitting-balance support: Feet unsupported, No upper extremity supported Sitting balance-Leahy Scale: Good     Standing balance support: Bilateral upper extremity supported Standing balance-Leahy Scale: Poor                             ADL either performed or assessed with clinical judgement   ADL Overall ADL's : Needs assistance/impaired Eating/Feeding: Set up;Sitting   Grooming: Set up;Sitting   Upper Body Bathing: Minimal assistance;Sitting   Lower Body Bathing: Maximal assistance;Sit to/from stand;Cueing for safety;+2 for safety/equipment   Upper Body Dressing : Set up;Sitting   Lower Body Dressing: Maximal assistance;Sit to/from stand;Cueing for safety;Cueing for sequencing   Toilet Transfer: Moderate assistance;Cueing for sequencing;Cueing for safety   Toileting- Clothing Manipulation and Hygiene: Maximal assistance;Sit to/from stand;Cueing for safety;Cueing for sequencing               Vision Patient Visual Report: No change from baseline              Pertinent Vitals/Pain Pain Assessment Faces Pain Scale: Hurts little more Pain Location: Bil LEs increased with WB Pain Descriptors / Indicators: Aching, Sore Pain Intervention(s): Limited activity within patient's  tolerance     Hand Dominance Right   Extremity/Trunk Assessment Upper Extremity Assessment Upper Extremity Assessment: Generalized weakness   Lower Extremity Assessment Lower Extremity Assessment: Generalized weakness       Communication Communication Communication: No difficulties    Cognition Arousal/Alertness: Awake/alert Behavior During Therapy: WFL for tasks assessed/performed Overall Cognitive Status: Within Functional Limits for tasks assessed                                                  Home Living Family/patient expects to be discharged to:: Skilled nursing facility Living Arrangements: Alone Available Help at Discharge: Family;Available PRN/intermittently Type of Home: Apartment Home Access: Level entry     Home Layout: One level               Home Equipment: Rollator (4 wheels)   Additional Comments: pt lives alone      Prior Functioning/Environment Prior Level of Function : Needs assist             Mobility Comments: typically ambulatory with rollator but increasing difficulty over last several weeks          OT Problem List: Decreased strength;Impaired balance (sitting and/or standing)      OT Treatment/Interventions: Self-care/ADL training    OT Goals(Current goals can be found in the care plan section) Acute Rehab OT Goals Patient Stated Goal: get strongert OT Goal Formulation: With patient Time For Goal Achievement: 09/28/22 Potential to Achieve Goals: Good  OT Frequency: Min 2X/week    Co-evaluation   Reason for Co-Treatment: To address functional/ADL transfers;For patient/therapist safety PT goals addressed during session: Mobility/safety with mobility OT goals addressed during session: ADL's and self-care      AM-PAC OT "6 Clicks" Daily Activity     Outcome Measure Help from another person eating meals?: A Little Help from another person taking care of personal grooming?: A Little Help from another person toileting, which includes using toliet, bedpan, or urinal?: A Little Help from another person bathing (including washing, rinsing, drying)?: A Little Help from another person to put on and taking off regular upper body clothing?: A Little Help from another person to put on and taking  off regular lower body clothing?: A Little 6 Click Score: 18   End of Session Equipment Utilized During Treatment: Gait belt;Rolling walker (2 wheels) Nurse Communication: Mobility status  Activity Tolerance: Patient tolerated treatment well Patient left: in bed;with call bell/phone within reach  OT Visit Diagnosis: Unsteadiness on feet (R26.81);Muscle weakness (generalized) (M62.81);Repeated falls (R29.6)                Time: 6440-3474 OT Time Calculation (min): 10 min Charges:  OT General Charges $OT Visit: 1 Visit OT Evaluation $OT Eval Low Complexity: 1 Low  [name], OT Acute Rehabilitation Services [phone] Office- [phone]     Shondrea Steinert, [name] 09/14/2022, 1:28 PM

## 2022-09-14 NOTE — ED Notes (Signed)
PT at bedside.

## 2022-09-14 NOTE — ED Notes (Signed)
Patient provided with meal tray and assisted to set up food to eat.

## 2022-09-14 NOTE — ED Notes (Signed)
Patient requested to sit in recliner. Two staff assist as patient ambulated two steps to recliner next to bed. Linen changed. Continues to visit with brother who is at bedside.

## 2022-09-14 NOTE — NC FL2 (Signed)
[place] LEVEL OF CARE SCREENING TOOL     IDENTIFICATION  Patient Name: Lauren Santana Birthdate: 1951-10-11 Sex: female Admission Date (Current Location): 09/13/2022  [place] and [place] Number:  Lauren Santana [id number] [place] and Address:  [facility],  [address] [place], [place]      Provider Number: [id number]  Attending Physician Name and Address:  Default, Provider, MD  Relative Name and Phone Number:  Lauren Santana [phone]    Current Level of Care: Hospital Recommended Level of Care: [place] Prior Approval Number:    Date Approved/Denied:   PASRR Number: [id number]  Discharge Plan: SNF    Current Diagnoses: Patient Active Problem List   Diagnosis Date Noted   Anticoagulated 12/11/2020   Cholecystitis, acute 08/16/2020   Abdominal pain 08/16/2020   Severe sepsis ([place]) 08/16/2020   Pressure injury of skin 08/16/2020   Atrial fibrillation ([place]) 07/24/2020   Thrombocytopenia ([place]) 07/24/2020   Anemia of chronic disease 07/24/2020   Chronic pain 07/24/2020   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[place]) 07/24/2020   Pulmonary embolism ([place]) 07/19/2020   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   S/P BSO (bilateral salpingo-oophorectomy) 01/08/2017   S/P hysterectomy 01/08/2017   Tachycardia    Acute deep vein thrombosis (DVT) of right lower extremity ([place]) 02/14/2016   Pulmonary emboli ([place]) 02/13/2016   PE (pulmonary embolism) 02/13/2016   AKI (acute kidney injury) ([place]) 02/13/2016   Pulmonary embolism with acute cor pulmonale (HCC)    Pelvic mass in female    Obesity (BMI 30-39.9) 08/20/2015   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   Essential hypertension, benign 07/23/2014   Edema 07/23/2014   Dyslipidemia 07/23/2014   Lower extremity numbness 07/23/2014   Arthritis of knee 07/17/2014   Postmenopausal bleeding 03/10/2012   Endometriosis of pelvis 01/22/2007     Orientation RESPIRATION BLADDER Height & Weight     Self, Situation, Place  Normal Continent Weight:   Height:     BEHAVIORAL SYMPTOMS/MOOD NEUROLOGICAL BOWEL NUTRITION STATUS      Continent Diet (Regular)  AMBULATORY STATUS COMMUNICATION OF NEEDS Skin   Limited Assist   Normal                       Personal Care Assistance Level of Assistance  Bathing, Feeding, Dressing Bathing Assistance: Limited assistance Feeding assistance: Limited assistance Dressing Assistance: Limited assistance     Functional Limitations Info  Sight, Hearing, Speech Sight Info: Adequate Hearing Info: Adequate Speech Info: Adequate    SPECIAL CARE FACTORS FREQUENCY  PT (By licensed PT), OT (By licensed OT)     PT Frequency: 5 x a week OT Frequency: 5 x a week            Contractures Contractures Info: Not present    Additional Factors Info  Code Status, Allergies Code Status Info: Full Code Allergies Info: Tylenol (Acetaminophen) and Sulfa Antibiotics           Current Medications (09/14/2022):  This is the current hospital active medication list Current Facility-Administered Medications  Medication Dose Route Frequency Provider Last Rate Last Admin   rivaroxaban (XARELTO) tablet 20 mg  20 mg Oral Q supper Tegeler, Gwenyth Allegra, MD   20 mg at 09/13/22 2223   Current Outpatient Medications  Medication Sig Dispense Refill   atorvastatin (LIPITOR) 10 MG tablet Take 10 mg by mouth daily.     [employer] VITAMIN B12 1000 MCG tablet  Take 1,000 mcg by mouth daily.     diclofenac Sodium (VOLTAREN) 1 % GEL Apply 2 g topically daily as needed (pain).      furosemide (LASIX) 20 MG tablet Take 20 mg by mouth daily.     gabapentin (NEURONTIN) 300 MG capsule Take 1 capsule (300 mg total) by mouth 3 (three) times daily. 6 capsule 0   labetalol (NORMODYNE) 100 MG tablet Take 100 mg by mouth 2 (two) times daily.     lidocaine (LIDODERM) 5 % Place 1 patch onto the skin daily. Remove & Discard  patch within 12 hours or as directed by MD 30 patch 0   mupirocin ointment (BACTROBAN) 2 % APPLY TO AFFECTED AREA TWICE A DAY (Patient taking differently: Apply 1 Application topically 2 (two) times daily.) 22 g 2   omeprazole (PRILOSEC) 40 MG capsule Take 40 mg by mouth 2 (two) times daily.     potassium chloride 20 MEQ/15ML (10%) SOLN SMARTSIG:3 teaspoon By Mouth Daily     rivaroxaban (XARELTO) 20 MG TABS tablet Take 1 tablet (20 mg total) by mouth daily with supper. 90 tablet 1   Silver (MEPILEX AG) 4"X4" PADS Apply 1 application  topically daily. 30 each 0   sucralfate (CARAFATE) 1 GM/10ML suspension SMARTSIG:2 teaspoon By Mouth 4 Times Daily     traMADol (ULTRAM) 50 MG tablet Take 50 mg by mouth every 8 (eight) hours as needed for moderate pain.     triamcinolone (KENALOG) 0.025 % cream Apply 1 Application topically 2 (two) times daily.     Lactobacillus (PROBIOTIC ACIDOPHILUS) CAPS Take 1 tablet by mouth in the morning and at bedtime. (Patient not taking: Reported on 09/14/2022) 30 capsule 0   ondansetron (ZOFRAN) 4 MG tablet Take 1 tablet (4 mg total) by mouth 2 (two) times daily as needed for nausea. (Patient not taking: Reported on 07/21/2022) 20 tablet 0   potassium chloride SA (KLOR-CON) 20 MEQ tablet Take 2 tablets (40 mEq total) by mouth daily. (Patient not taking: Reported on 09/14/2022)     prochlorperazine (COMPAZINE) 5 MG tablet Take 1 tablet (5 mg total) by mouth every 6 (six) hours as needed for nausea or vomiting. (Patient not taking: Reported on 07/21/2022) 30 tablet 0     Discharge Medications: Please see discharge summary for a list of discharge medications.  Relevant Imaging Results:  Relevant Lab Results:   Additional Information SSN:  [id number] HT:  5'2"  WT:  194 lbs   BMI:  35.48 kg/m2  Lauren Santana, LCSWA

## 2022-09-14 NOTE — ED Notes (Signed)
Patient assisted to restroom in recliner. With two staff assist ambulated to toilet.

## 2022-09-14 NOTE — ED Provider Notes (Signed)
Patient care assumed after ED ED transfer from [place] ED via [facility].  To briefly summarize patient was seen yesterday due to decreased mobility and lower extremity pain and swelling which has been a chronic issue worsening recently.  Patient sent to ED for PT evaluation and TOC evaluation.  Physical Exam  BP 137/83   Pulse 76   Temp (!) 97.4 F (36.3 C) (Oral)   Resp 18   LMP  (LMP Unknown)   SpO2 97%   Physical Exam Vitals and nursing note reviewed. Exam conducted with a chaperone present.  Constitutional:      General: She is not in acute distress.    Appearance: Normal appearance.  HENT:     Head: Normocephalic and atraumatic.     Mouth/Throat:     Comments: Edentulous Eyes:     General: No scleral icterus.    Extraocular Movements: Extraocular movements intact.     Pupils: Pupils are equal, round, and reactive to light.  Cardiovascular:     Pulses: Normal pulses.     Comments: Palpable pedal pulses, DP PT symmetric bilaterally Musculoskeletal:        General: Swelling and tenderness present.     Comments: Bilateral lower extremity lymphedema.  Diffuse tenderness, no focal tenderness or bony tenderness.  Skin:    Capillary Refill: Capillary refill takes less than 2 seconds.     Coloration: Skin is not jaundiced.  Neurological:     Mental Status: She is alert. Mental status is at baseline.     Coordination: Coordination normal.     Procedures  Procedures  ED Course / MDM    Medical Decision Making Amount and/or Complexity of Data Reviewed Labs: ordered.  Risk Prescription drug management.   I reviewed external medical records including ED notes from today as well as 09/09/2022.  Reviewed PT notes and social work notes.  This appears to be a chronic issue which is worsened and patient is unable to perform ADLs at home.  Transition of care, PT evaluation OT evaluation were ordered.    Patient has not had laboratory work-up since September although she  was seen for recent fall and lower extremity swelling on 09/09/22.  Discussed with attending Dr. Roderic Palau, given her age and comorbidities we will check basic labs.  No need to repeat any imaging.  Per my interpretation of laboratory workup: -CBC shows mild leukopenia with a white count of 3.9, no significant anemia, platelets 100. -BMP with mild hypokalemia, 3.3 and mild hypocalcemia 8.7.  Patient has already had an Xarelto.  I ordered oxycodone given patient's trend of pain.  Diet order also placed.  Patient is stable at this time awaiting PT evaluation and TOC/social work consult.     Sherrill Raring, PA-C 09/14/22 [id number]    Milton Ferguson, MD 09/15/22 0930

## 2022-09-14 NOTE — ED Notes (Signed)
Patient remains in recliner. States that she is comfortable. Brother at bedside.

## 2022-09-14 NOTE — ED Notes (Signed)
Family at bedside. 

## 2022-09-14 NOTE — ED Notes (Signed)
Called [name] (son) and gave an update on the patient's current status. Will call back once we here back from PT and SW.

## 2022-09-14 NOTE — Evaluation (Signed)
Physical Therapy Evaluation Patient Details Name: Lauren Santana MRN: [id number] DOB: August 22, 1951 Today's Date: 09/14/2022  History of Present Illness  71 y.o. female, history of diffuse large B-cell lymphoma, L TKA 2015, PE, T10/T11 fractures who presents to ED with c/p bil LE pain/edema and inability to ambulate and self care.  Pt in ED 10/17 with similar report and with no improvement since that time  Clinical Impression  Pt admitted as above and presenting with functional mobility limitations 2* generalized weakness, LE pain with mobility, decreased endurance and ambulatory balance deficits.  Pt currently requiring assist for all basic mobility tasks and is increased risk of falling.  Pt would benefit from follow up rehab at SNF level to maximize IND and safety prior to return home with limited assist.     Recommendations for follow up therapy are one component of a multi-disciplinary discharge planning process, led by the attending physician.  Recommendations may be updated based on patient status, additional functional criteria and insurance authorization.  Follow Up Recommendations Skilled nursing-short term rehab (<3 hours/day) Can patient physically be transported by private vehicle: Yes    Assistance Recommended at Discharge Frequent or constant Supervision/Assistance  Patient can return home with the following  A lot of help with walking and/or transfers;A little help with bathing/dressing/bathroom;Assistance with cooking/housework;Assist for transportation;Help with stairs or ramp for entrance    Equipment Recommendations None recommended by PT  Recommendations for Other Services       Functional Status Assessment Patient has had a recent decline in their functional status and demonstrates the ability to make significant improvements in function in a reasonable and predictable amount of time.     Precautions / Restrictions Precautions Precautions:  Fall Restrictions Weight Bearing Restrictions: No      Mobility  Bed Mobility Overal bed mobility: Needs Assistance Bed Mobility: Supine to Sit, Sit to Supine     Supine to sit: Min guard Sit to supine: Mod assist   General bed mobility comments: min guard for safety to EOB but assist to return bil LEs up onto bed    Transfers Overall transfer level: Needs assistance Equipment used: Rolling walker (2 wheels) Transfers: Sit to/from Stand Sit to Stand: Min assist, From elevated surface           General transfer comment: Steady assist to stand from high bed    Ambulation/Gait Ambulation/Gait assistance: Min assist Gait Distance (Feet): 8 Feet Assistive device: Rolling walker (2 wheels) Gait Pattern/deviations: Step-to pattern, Decreased step length - right, Decreased step length - left, Shuffle, Trunk flexed Gait velocity: decr     General Gait Details: Increased time with cues for posture and position from RW; distance ltd by reports of fatigue and increasing LE pain  Stairs            Wheelchair Mobility    Modified Rankin (Stroke Patients Only)       Balance Overall balance assessment: Needs assistance Sitting-balance support: Feet unsupported, No upper extremity supported Sitting balance-Leahy Scale: Good     Standing balance support: Bilateral upper extremity supported Standing balance-Leahy Scale: Poor                               Pertinent Vitals/Pain Pain Assessment Pain Assessment: Faces Faces Pain Scale: Hurts little more Pain Location: Bil LEs increased with WB Pain Descriptors / Indicators: Aching, Sore Pain Intervention(s): Limited activity within patient's tolerance, Monitored during session  Home Living Family/patient expects to be discharged to:: Skilled nursing facility Living Arrangements: Alone Available Help at Discharge: Family;Available PRN/intermittently Type of Home: Apartment Home Access: Level  entry       Home Layout: One level Home Equipment: Rollator (4 wheels) Additional Comments: pt lives alone    Prior Function Prior Level of Function : Needs assist             Mobility Comments: typically ambulatory with rollator but increasing difficulty over last several weeks       Hand Dominance   Dominant Hand: Right    Extremity/Trunk Assessment   Upper Extremity Assessment Upper Extremity Assessment: Defer to OT evaluation    Lower Extremity Assessment Lower Extremity Assessment: Generalized weakness       Communication   Communication: No difficulties  Cognition Arousal/Alertness: Awake/alert Behavior During Therapy: WFL for tasks assessed/performed Overall Cognitive Status: Within Functional Limits for tasks assessed                                          General Comments      Exercises     Assessment/Plan    PT Assessment Patient needs continued PT services  PT Problem List Decreased activity tolerance;Decreased strength;Decreased knowledge of use of DME;Decreased mobility;Obesity       PT Treatment Interventions Gait training;DME instruction;Therapeutic activities;Therapeutic exercise;Functional mobility training;Balance training;Patient/family education    PT Goals (Current goals can be found in the Care Plan section)  Acute Rehab PT Goals Patient Stated Goal: Rehab to regain strength and IND PT Goal Formulation: With patient Time For Goal Achievement: 09/28/22 Potential to Achieve Goals: Fair    Frequency Min 3X/week     Co-evaluation PT/OT/SLP Co-Evaluation/Treatment: Yes Reason for Co-Treatment: To address functional/ADL transfers;For patient/therapist safety PT goals addressed during session: Mobility/safety with mobility OT goals addressed during session: ADL's and self-care       AM-PAC PT "6 Clicks" Mobility  Outcome Measure Help needed turning from your back to your side while in a flat bed without  using bedrails?: A Little Help needed moving from lying on your back to sitting on the side of a flat bed without using bedrails?: A Little Help needed moving to and from a bed to a chair (including a wheelchair)?: A Little Help needed standing up from a chair using your arms (e.g., wheelchair or bedside chair)?: A Little Help needed to walk in hospital room?: Total Help needed climbing 3-5 steps with a railing? : Total 6 Click Score: 14    End of Session Equipment Utilized During Treatment: Gait belt Activity Tolerance: Patient tolerated treatment well;Patient limited by fatigue;Patient limited by pain Patient left: in bed;with call bell/phone within reach Nurse Communication: Mobility status PT Visit Diagnosis: Difficulty in walking, not elsewhere classified (R26.2);Muscle weakness (generalized) (M62.81)    Time: 9163-8466 PT Time Calculation (min) (ACUTE ONLY): 20 min   Charges:   PT Evaluation $PT Eval Low Complexity: 1 Low          [place] [facility] Pager [phone] Office [phone]   Doratha Mcswain 09/14/2022, 1:01 PM

## 2022-09-14 NOTE — ED Notes (Signed)
Report given to Retail buyer at [facility] ED. Carelink report given via phone.

## 2022-09-14 NOTE — Progress Notes (Signed)
TOC CSW currently awaiting evaluation from PT.  PT's recommendation is how CSW will follow up with dc planning.  Anders Hohmann Tarpley-Carter, MSW, LCSW-A Pronouns:  She/Her/Hers [facility] HealthTransitions of Care Clinical Social Worker Direct Number:  [phone] [email or web address]

## 2022-09-15 ENCOUNTER — Ambulatory Visit: Payer: Medicare Other

## 2022-09-15 DIAGNOSIS — D518 Other vitamin B12 deficiency anemias: Secondary | ICD-10-CM | POA: Diagnosis not present

## 2022-09-15 DIAGNOSIS — I4821 Permanent atrial fibrillation: Secondary | ICD-10-CM | POA: Diagnosis not present

## 2022-09-15 DIAGNOSIS — R278 Other lack of coordination: Secondary | ICD-10-CM | POA: Diagnosis not present

## 2022-09-15 DIAGNOSIS — I2699 Other pulmonary embolism without acute cor pulmonale: Secondary | ICD-10-CM | POA: Diagnosis not present

## 2022-09-15 DIAGNOSIS — R262 Difficulty in walking, not elsewhere classified: Secondary | ICD-10-CM | POA: Diagnosis not present

## 2022-09-15 DIAGNOSIS — G894 Chronic pain syndrome: Secondary | ICD-10-CM | POA: Diagnosis not present

## 2022-09-15 DIAGNOSIS — C8330 Diffuse large B-cell lymphoma, unspecified site: Secondary | ICD-10-CM | POA: Diagnosis not present

## 2022-09-15 DIAGNOSIS — E782 Mixed hyperlipidemia: Secondary | ICD-10-CM | POA: Diagnosis not present

## 2022-09-15 DIAGNOSIS — R293 Abnormal posture: Secondary | ICD-10-CM | POA: Diagnosis not present

## 2022-09-15 DIAGNOSIS — M7989 Other specified soft tissue disorders: Secondary | ICD-10-CM | POA: Diagnosis not present

## 2022-09-15 DIAGNOSIS — I482 Chronic atrial fibrillation, unspecified: Secondary | ICD-10-CM | POA: Diagnosis not present

## 2022-09-15 DIAGNOSIS — R200 Anesthesia of skin: Secondary | ICD-10-CM | POA: Diagnosis not present

## 2022-09-15 DIAGNOSIS — I480 Paroxysmal atrial fibrillation: Secondary | ICD-10-CM | POA: Diagnosis not present

## 2022-09-15 DIAGNOSIS — E876 Hypokalemia: Secondary | ICD-10-CM | POA: Diagnosis not present

## 2022-09-15 DIAGNOSIS — R2681 Unsteadiness on feet: Secondary | ICD-10-CM | POA: Diagnosis not present

## 2022-09-15 DIAGNOSIS — M150 Primary generalized (osteo)arthritis: Secondary | ICD-10-CM | POA: Diagnosis not present

## 2022-09-15 DIAGNOSIS — I10 Essential (primary) hypertension: Secondary | ICD-10-CM | POA: Diagnosis not present

## 2022-09-15 DIAGNOSIS — Z79899 Other long term (current) drug therapy: Secondary | ICD-10-CM | POA: Diagnosis not present

## 2022-09-15 DIAGNOSIS — D638 Anemia in other chronic diseases classified elsewhere: Secondary | ICD-10-CM | POA: Diagnosis not present

## 2022-09-15 DIAGNOSIS — M19011 Primary osteoarthritis, right shoulder: Secondary | ICD-10-CM | POA: Diagnosis not present

## 2022-09-15 DIAGNOSIS — M79605 Pain in left leg: Secondary | ICD-10-CM | POA: Diagnosis not present

## 2022-09-15 DIAGNOSIS — Z7901 Long term (current) use of anticoagulants: Secondary | ICD-10-CM | POA: Diagnosis not present

## 2022-09-15 DIAGNOSIS — C8510 Unspecified B-cell lymphoma, unspecified site: Secondary | ICD-10-CM | POA: Diagnosis not present

## 2022-09-15 DIAGNOSIS — R600 Localized edema: Secondary | ICD-10-CM | POA: Diagnosis not present

## 2022-09-15 DIAGNOSIS — Z5321 Procedure and treatment not carried out due to patient leaving prior to being seen by health care provider: Secondary | ICD-10-CM | POA: Diagnosis not present

## 2022-09-15 DIAGNOSIS — K219 Gastro-esophageal reflux disease without esophagitis: Secondary | ICD-10-CM | POA: Diagnosis not present

## 2022-09-15 DIAGNOSIS — R531 Weakness: Secondary | ICD-10-CM | POA: Diagnosis not present

## 2022-09-15 DIAGNOSIS — M79604 Pain in right leg: Secondary | ICD-10-CM | POA: Diagnosis not present

## 2022-09-15 DIAGNOSIS — Z743 Need for continuous supervision: Secondary | ICD-10-CM | POA: Diagnosis not present

## 2022-09-15 DIAGNOSIS — K5900 Constipation, unspecified: Secondary | ICD-10-CM | POA: Diagnosis not present

## 2022-09-15 NOTE — Progress Notes (Addendum)
This CSW presented bed offers to the pt's son, [name], who is the pt's Legal Guardian. They have chosen to move forward with [facility]. Ins Auth started. [facility] selected in the hub. TOC following.   Addend @ 11:42 AM Auth still pending.   Addend @ 2:58 PM The pt's Auth is approved. This CSW contacted Janie at [facility] to inform that she is approved and will be d/c today. EDP, Dr. Oswald Hillock and Lynn Ito, RN notified.

## 2022-09-15 NOTE — ED Notes (Signed)
Attempted to call report to [facility], states they will call back.

## 2022-09-15 NOTE — ED Provider Notes (Signed)
Care patient received from prior provider.  In short, patient with ongoing weakness and failure to thrive.  Patient has been a social work dispo today.  Update from social work that patient has received placement at a local skilled nursing facility.  Patient to transfer to skilled nursing facility at this time.  Patient informed in no acute distress stable for outpatient care management.   Tretha Sciara, MD 09/15/22 1539

## 2022-09-16 DIAGNOSIS — K219 Gastro-esophageal reflux disease without esophagitis: Secondary | ICD-10-CM | POA: Diagnosis not present

## 2022-09-16 DIAGNOSIS — I10 Essential (primary) hypertension: Secondary | ICD-10-CM | POA: Diagnosis not present

## 2022-09-16 DIAGNOSIS — I4821 Permanent atrial fibrillation: Secondary | ICD-10-CM | POA: Diagnosis not present

## 2022-09-16 DIAGNOSIS — K5900 Constipation, unspecified: Secondary | ICD-10-CM | POA: Diagnosis not present

## 2022-09-16 DIAGNOSIS — C8330 Diffuse large B-cell lymphoma, unspecified site: Secondary | ICD-10-CM | POA: Diagnosis not present

## 2022-09-16 DIAGNOSIS — E782 Mixed hyperlipidemia: Secondary | ICD-10-CM | POA: Diagnosis not present

## 2022-09-16 DIAGNOSIS — R200 Anesthesia of skin: Secondary | ICD-10-CM | POA: Diagnosis not present

## 2022-09-16 DIAGNOSIS — R600 Localized edema: Secondary | ICD-10-CM | POA: Diagnosis not present

## 2022-09-16 DIAGNOSIS — G894 Chronic pain syndrome: Secondary | ICD-10-CM | POA: Diagnosis not present

## 2022-09-16 DIAGNOSIS — M150 Primary generalized (osteo)arthritis: Secondary | ICD-10-CM | POA: Diagnosis not present

## 2022-09-16 DIAGNOSIS — D638 Anemia in other chronic diseases classified elsewhere: Secondary | ICD-10-CM | POA: Diagnosis not present

## 2022-09-16 DIAGNOSIS — I2699 Other pulmonary embolism without acute cor pulmonale: Secondary | ICD-10-CM | POA: Diagnosis not present

## 2022-09-17 ENCOUNTER — Encounter: Payer: Medicare Other | Admitting: Physical Therapy

## 2022-09-17 DIAGNOSIS — M150 Primary generalized (osteo)arthritis: Secondary | ICD-10-CM | POA: Diagnosis not present

## 2022-09-17 DIAGNOSIS — I10 Essential (primary) hypertension: Secondary | ICD-10-CM | POA: Diagnosis not present

## 2022-09-17 DIAGNOSIS — I4821 Permanent atrial fibrillation: Secondary | ICD-10-CM | POA: Diagnosis not present

## 2022-09-17 DIAGNOSIS — R600 Localized edema: Secondary | ICD-10-CM | POA: Diagnosis not present

## 2022-09-17 DIAGNOSIS — E782 Mixed hyperlipidemia: Secondary | ICD-10-CM | POA: Diagnosis not present

## 2022-09-17 DIAGNOSIS — G894 Chronic pain syndrome: Secondary | ICD-10-CM | POA: Diagnosis not present

## 2022-09-17 DIAGNOSIS — D638 Anemia in other chronic diseases classified elsewhere: Secondary | ICD-10-CM | POA: Diagnosis not present

## 2022-09-19 DIAGNOSIS — C8510 Unspecified B-cell lymphoma, unspecified site: Secondary | ICD-10-CM | POA: Diagnosis not present

## 2022-09-19 DIAGNOSIS — I480 Paroxysmal atrial fibrillation: Secondary | ICD-10-CM | POA: Diagnosis not present

## 2022-09-19 DIAGNOSIS — I2699 Other pulmonary embolism without acute cor pulmonale: Secondary | ICD-10-CM | POA: Diagnosis not present

## 2022-09-19 DIAGNOSIS — R531 Weakness: Secondary | ICD-10-CM | POA: Diagnosis not present

## 2022-09-22 ENCOUNTER — Ambulatory Visit: Payer: Medicare Other

## 2022-09-22 DIAGNOSIS — G894 Chronic pain syndrome: Secondary | ICD-10-CM | POA: Diagnosis not present

## 2022-09-22 DIAGNOSIS — I4821 Permanent atrial fibrillation: Secondary | ICD-10-CM | POA: Diagnosis not present

## 2022-09-22 DIAGNOSIS — M150 Primary generalized (osteo)arthritis: Secondary | ICD-10-CM | POA: Diagnosis not present

## 2022-09-22 DIAGNOSIS — I10 Essential (primary) hypertension: Secondary | ICD-10-CM | POA: Diagnosis not present

## 2022-09-23 ENCOUNTER — Other Ambulatory Visit: Payer: Self-pay | Admitting: *Deleted

## 2022-09-23 NOTE — Patient Outreach (Signed)
Per [facility] Ms. Pen resides in [facility]. Screening for potential [facility] care coordination services as benefit of PCP and insurance plan.   Secure communication sent to [facility] SN[facility] social worker to make aware writer is following for transition plans and potential [facility] needs.  Marthenia Rolling, MSN, RN,BSN [place] Acute Care Coordinator [phone] (Direct dial)

## 2022-10-01 DIAGNOSIS — I482 Chronic atrial fibrillation, unspecified: Secondary | ICD-10-CM | POA: Diagnosis not present

## 2022-10-01 DIAGNOSIS — I10 Essential (primary) hypertension: Secondary | ICD-10-CM | POA: Diagnosis not present

## 2022-10-01 DIAGNOSIS — M150 Primary generalized (osteo)arthritis: Secondary | ICD-10-CM | POA: Diagnosis not present

## 2022-10-01 DIAGNOSIS — G894 Chronic pain syndrome: Secondary | ICD-10-CM | POA: Diagnosis not present

## 2022-10-01 DIAGNOSIS — K219 Gastro-esophageal reflux disease without esophagitis: Secondary | ICD-10-CM | POA: Diagnosis not present

## 2022-10-03 DIAGNOSIS — G894 Chronic pain syndrome: Secondary | ICD-10-CM | POA: Diagnosis not present

## 2022-10-03 DIAGNOSIS — R600 Localized edema: Secondary | ICD-10-CM | POA: Diagnosis not present

## 2022-10-03 DIAGNOSIS — I10 Essential (primary) hypertension: Secondary | ICD-10-CM | POA: Diagnosis not present

## 2022-10-03 DIAGNOSIS — K219 Gastro-esophageal reflux disease without esophagitis: Secondary | ICD-10-CM | POA: Diagnosis not present

## 2022-10-03 DIAGNOSIS — I482 Chronic atrial fibrillation, unspecified: Secondary | ICD-10-CM | POA: Diagnosis not present

## 2022-10-03 DIAGNOSIS — M150 Primary generalized (osteo)arthritis: Secondary | ICD-10-CM | POA: Diagnosis not present

## 2022-10-07 ENCOUNTER — Other Ambulatory Visit: Payer: Self-pay | Admitting: *Deleted

## 2022-10-07 NOTE — Patient Outreach (Signed)
[place] Coordinator follow up. Ms. Karp resides in [facility]. Screening for potential [facility] care coordination services as benefit of insurance plan and PCP.  Spoke with [facility] social worker. Ms. [place] appealed and won appeal. Plan is to return home. Son [name] is primary contact.   Will plan outreach to son/DPR [name] to discuss potential [facility] services.   Marthenia Rolling, MSN, RN,BSN [place] Acute Care Coordinator [phone] (Direct dial)

## 2022-10-10 DIAGNOSIS — G894 Chronic pain syndrome: Secondary | ICD-10-CM | POA: Diagnosis not present

## 2022-10-10 DIAGNOSIS — I10 Essential (primary) hypertension: Secondary | ICD-10-CM | POA: Diagnosis not present

## 2022-10-10 DIAGNOSIS — I482 Chronic atrial fibrillation, unspecified: Secondary | ICD-10-CM | POA: Diagnosis not present

## 2022-10-10 DIAGNOSIS — K219 Gastro-esophageal reflux disease without esophagitis: Secondary | ICD-10-CM | POA: Diagnosis not present

## 2022-10-10 DIAGNOSIS — R600 Localized edema: Secondary | ICD-10-CM | POA: Diagnosis not present

## 2022-10-10 DIAGNOSIS — M150 Primary generalized (osteo)arthritis: Secondary | ICD-10-CM | POA: Diagnosis not present

## 2022-10-13 ENCOUNTER — Other Ambulatory Visit: Payer: Self-pay | Admitting: *Deleted

## 2022-10-13 DIAGNOSIS — I10 Essential (primary) hypertension: Secondary | ICD-10-CM

## 2022-10-13 NOTE — Patient Outreach (Signed)
[facility] Post- Acute Care Coordinator follow up. Verified in [facility] Ms. Windsor discharged from [facility] on 10/12/22. Screening for potential care coordination services as benefit of insurance plan and PCP.   SNF social worker previously indicated Ms. Sargent's son [name] is primary contact. Telephone call made to [name] (son/DPR) [phone] to discuss [facility] services. However, there was no answer. Left HIPAA compliant voicemail message to request return call.   Left voicemail message and sent secure email to [facility] social worker Florentina Jenny to inquire about home health arrangements.   Will make referral to [facility] care coordination team.  Marthenia Rolling, MSN, RN,BSN [place] Acute Care Coordinator [phone] (Direct dial)

## 2022-10-14 ENCOUNTER — Telehealth: Payer: Self-pay | Admitting: *Deleted

## 2022-10-14 NOTE — Progress Notes (Signed)
  Care Coordination  Outreach Note  10/14/2022 Name: Lauren Santana MRN: [id number] DOB: 1950/12/21   Care Coordination Outreach Attempts: An unsuccessful telephone outreach was attempted today to offer the patient information about available care coordination services as a benefit of their health plan.   Follow Up Plan:  Additional outreach attempts will be made to offer the patient care coordination information and services.   Encounter Outcome:  No Answer  [place]  Direct Dial: [phone]

## 2022-10-15 NOTE — Progress Notes (Signed)
  Care Coordination  Outreach Note  10/15/2022 Name: Lauren Santana MRN: [id number] DOB: Mar 24, 1951   Care Coordination Outreach Attempts: A second unsuccessful outreach was attempted today to offer the patient with information about available care coordination services as a benefit of their health plan.     Follow Up Plan:  Additional outreach attempts will be made to offer the patient care coordination information and services.   Encounter Outcome:  No Answer  [place]  Direct Dial: [phone]

## 2022-10-20 NOTE — Progress Notes (Addendum)
  Care Coordination   Note   10/20/2022 Name: Lauren Santana MRN: [id number] DOB: 1951/07/29  Lauren Santana is a 71 y.o. year old female who sees Lucianne Lei, MD for primary care. I reached out to [name] by phone today to offer care coordination services.  [name] and son Lauren Santana was given information about Care Coordination services today including:   The Care Coordination services include support from the care team which includes your Nurse Coordinator, Clinical Social Worker, or Pharmacist.  The Care Coordination team is here to help remove barriers to the health concerns and goals most important to you. Care Coordination services are voluntary, and the patient may decline or stop services at any time by request to their care team member.   Care Coordination Consent Status: Patient agreed to services and verbal consent obtained. [name] agreed to services and verbal consent obtained.   Follow up plan:  Telephone appointment with care coordination team member scheduled for:  10/28/22  and 11/29 with BSW   Encounter Outcome:  Pt. Scheduled  [place]  Direct Dial: [phone]

## 2022-10-21 ENCOUNTER — Ambulatory Visit: Payer: Medicare Other | Admitting: Podiatry

## 2022-10-22 ENCOUNTER — Ambulatory Visit: Payer: Self-pay

## 2022-10-22 NOTE — Patient Outreach (Signed)
  Care Coordination   Initial Visit Note   10/22/2022 Name: Lauren Santana MRN: [id number] DOB: 11/13/1951  Lauren Santana is a 71 y.o. year old female who sees Lucianne Lei, MD for primary care. I spoke with  [name] by phone today.  What matters to the patients health and wellness today?  I need more help at home    Goals Addressed             This Visit's Progress    Care Coordination Activities       Care Coordination Interventions: Discussed the patient would like assistance in the home with morning ADl's Education on [facility] program covered under Medicaid benefit - application initiated and faxed to Dr. Fransico Setters office for completion Determined the patient has yet to begin home health services since returning home from rehab During today's call, Deirdra from Dr. Fransico Setters office arrived at the patients home to complete a transitions of care visit Reviewed plan for Deirdra to assist with following up on status of home health while SW focuses on PCS needs Advised the patient SW will follow up with her regarding PCS application over the next week         SDOH assessments and interventions completed:  No     Care Coordination Interventions:  Yes, provided   Follow up plan:  SW to continue to follow    Encounter Outcome:  Pt. Visit Completed   Daneen Schick, Arita Miss, CDP Social Worker, Certified Dementia Practitioner [place] Coordination [phone]

## 2022-10-22 NOTE — Patient Instructions (Signed)
Visit Information  Thank you for taking time to visit with me today. Please don't hesitate to contact me if I can be of assistance to you.   Following are the goals we discussed today:   Goals Addressed             This Visit's Progress    Care Coordination Activities       Care Coordination Interventions: Discussed the patient would like assistance in the home with morning ADl's Education on [facility] program covered under Medicaid benefit - application initiated and faxed to Dr. Fransico Setters office for completion Determined the patient has yet to begin home health services since returning home from rehab During today's call, Deirdra from Dr. Fransico Setters office arrived at the patients home to complete a transitions of care visit Reviewed plan for Deirdra to assist with following up on status of home health while SW focuses on PCS needs Advised the patient SW will follow up with her regarding PCS application over the next week         If you are experiencing a Mental Health or [place] or need someone to talk to, please call 1-800-273-TALK (toll free, 24 hour hotline)  Patient verbalizes understanding of instructions and care plan provided today and agrees to view in [place]. Active MyChart status and patient understanding of how to access instructions and care plan via MyChart confirmed with patient.     The care management team will reach out to the patient again over the next 10 days.   Daneen Schick, BSW, CDP Social Worker, Certified Dementia Practitioner [place] [facility]  Care Coordination [phone]

## 2022-10-25 DIAGNOSIS — R7303 Prediabetes: Secondary | ICD-10-CM | POA: Diagnosis not present

## 2022-10-25 DIAGNOSIS — Z86718 Personal history of other venous thrombosis and embolism: Secondary | ICD-10-CM | POA: Diagnosis not present

## 2022-10-25 DIAGNOSIS — K819 Cholecystitis, unspecified: Secondary | ICD-10-CM | POA: Diagnosis not present

## 2022-10-25 DIAGNOSIS — M19011 Primary osteoarthritis, right shoulder: Secondary | ICD-10-CM | POA: Diagnosis not present

## 2022-10-25 DIAGNOSIS — C8330 Diffuse large B-cell lymphoma, unspecified site: Secondary | ICD-10-CM | POA: Diagnosis not present

## 2022-10-25 DIAGNOSIS — I2602 Saddle embolus of pulmonary artery with acute cor pulmonale: Secondary | ICD-10-CM | POA: Diagnosis not present

## 2022-10-25 DIAGNOSIS — I10 Essential (primary) hypertension: Secondary | ICD-10-CM | POA: Diagnosis not present

## 2022-10-25 DIAGNOSIS — R200 Anesthesia of skin: Secondary | ICD-10-CM | POA: Diagnosis not present

## 2022-10-25 DIAGNOSIS — I4891 Unspecified atrial fibrillation: Secondary | ICD-10-CM | POA: Diagnosis not present

## 2022-10-25 DIAGNOSIS — R600 Localized edema: Secondary | ICD-10-CM | POA: Diagnosis not present

## 2022-10-25 DIAGNOSIS — E7800 Pure hypercholesterolemia, unspecified: Secondary | ICD-10-CM | POA: Diagnosis not present

## 2022-10-25 DIAGNOSIS — G8929 Other chronic pain: Secondary | ICD-10-CM | POA: Diagnosis not present

## 2022-10-25 DIAGNOSIS — R2681 Unsteadiness on feet: Secondary | ICD-10-CM | POA: Diagnosis not present

## 2022-10-25 DIAGNOSIS — Z7901 Long term (current) use of anticoagulants: Secondary | ICD-10-CM | POA: Diagnosis not present

## 2022-10-27 DIAGNOSIS — Z7901 Long term (current) use of anticoagulants: Secondary | ICD-10-CM | POA: Diagnosis not present

## 2022-10-27 DIAGNOSIS — R200 Anesthesia of skin: Secondary | ICD-10-CM | POA: Diagnosis not present

## 2022-10-27 DIAGNOSIS — E7800 Pure hypercholesterolemia, unspecified: Secondary | ICD-10-CM | POA: Diagnosis not present

## 2022-10-27 DIAGNOSIS — Z86718 Personal history of other venous thrombosis and embolism: Secondary | ICD-10-CM | POA: Diagnosis not present

## 2022-10-27 DIAGNOSIS — R600 Localized edema: Secondary | ICD-10-CM | POA: Diagnosis not present

## 2022-10-27 DIAGNOSIS — M19011 Primary osteoarthritis, right shoulder: Secondary | ICD-10-CM | POA: Diagnosis not present

## 2022-10-27 DIAGNOSIS — C8330 Diffuse large B-cell lymphoma, unspecified site: Secondary | ICD-10-CM | POA: Diagnosis not present

## 2022-10-27 DIAGNOSIS — G8929 Other chronic pain: Secondary | ICD-10-CM | POA: Diagnosis not present

## 2022-10-27 DIAGNOSIS — I10 Essential (primary) hypertension: Secondary | ICD-10-CM | POA: Diagnosis not present

## 2022-10-27 DIAGNOSIS — K819 Cholecystitis, unspecified: Secondary | ICD-10-CM | POA: Diagnosis not present

## 2022-10-27 DIAGNOSIS — I2602 Saddle embolus of pulmonary artery with acute cor pulmonale: Secondary | ICD-10-CM | POA: Diagnosis not present

## 2022-10-27 DIAGNOSIS — R2681 Unsteadiness on feet: Secondary | ICD-10-CM | POA: Diagnosis not present

## 2022-10-27 DIAGNOSIS — R7303 Prediabetes: Secondary | ICD-10-CM | POA: Diagnosis not present

## 2022-10-27 DIAGNOSIS — I4891 Unspecified atrial fibrillation: Secondary | ICD-10-CM | POA: Diagnosis not present

## 2022-10-28 ENCOUNTER — Ambulatory Visit: Payer: Self-pay

## 2022-10-28 DIAGNOSIS — L89152 Pressure ulcer of sacral region, stage 2: Secondary | ICD-10-CM | POA: Diagnosis not present

## 2022-10-28 NOTE — Patient Outreach (Signed)
  Care Coordination   10/28/2022 Name: Lauren Santana MRN: [id number] DOB: 08/05/1951   Care Coordination Outreach Attempts:  An unsuccessful telephone outreach was attempted today to offer the patient information about available care coordination services as a benefit of their health plan.   Follow Up Plan:  Additional outreach attempts will be made to offer the patient care coordination information and services.   Encounter Outcome:  No Answer   Care Coordination Interventions:  No, not indicated    Barb Merino, RN, BSN, CCM Care Management Coordinator [facility] Care Management  Direct Phone: [phone]

## 2022-10-29 DIAGNOSIS — E7800 Pure hypercholesterolemia, unspecified: Secondary | ICD-10-CM | POA: Diagnosis not present

## 2022-10-29 DIAGNOSIS — M19011 Primary osteoarthritis, right shoulder: Secondary | ICD-10-CM | POA: Diagnosis not present

## 2022-10-29 DIAGNOSIS — I4891 Unspecified atrial fibrillation: Secondary | ICD-10-CM | POA: Diagnosis not present

## 2022-10-29 DIAGNOSIS — Z86718 Personal history of other venous thrombosis and embolism: Secondary | ICD-10-CM | POA: Diagnosis not present

## 2022-10-29 DIAGNOSIS — C8330 Diffuse large B-cell lymphoma, unspecified site: Secondary | ICD-10-CM | POA: Diagnosis not present

## 2022-10-29 DIAGNOSIS — R7303 Prediabetes: Secondary | ICD-10-CM | POA: Diagnosis not present

## 2022-10-29 DIAGNOSIS — R600 Localized edema: Secondary | ICD-10-CM | POA: Diagnosis not present

## 2022-10-29 DIAGNOSIS — K819 Cholecystitis, unspecified: Secondary | ICD-10-CM | POA: Diagnosis not present

## 2022-10-29 DIAGNOSIS — R200 Anesthesia of skin: Secondary | ICD-10-CM | POA: Diagnosis not present

## 2022-10-29 DIAGNOSIS — I2602 Saddle embolus of pulmonary artery with acute cor pulmonale: Secondary | ICD-10-CM | POA: Diagnosis not present

## 2022-10-29 DIAGNOSIS — G8929 Other chronic pain: Secondary | ICD-10-CM | POA: Diagnosis not present

## 2022-10-29 DIAGNOSIS — R2681 Unsteadiness on feet: Secondary | ICD-10-CM | POA: Diagnosis not present

## 2022-10-29 DIAGNOSIS — I10 Essential (primary) hypertension: Secondary | ICD-10-CM | POA: Diagnosis not present

## 2022-10-29 DIAGNOSIS — Z7901 Long term (current) use of anticoagulants: Secondary | ICD-10-CM | POA: Diagnosis not present

## 2022-10-30 ENCOUNTER — Ambulatory Visit: Payer: Self-pay

## 2022-10-30 DIAGNOSIS — R7303 Prediabetes: Secondary | ICD-10-CM | POA: Diagnosis not present

## 2022-10-30 DIAGNOSIS — R200 Anesthesia of skin: Secondary | ICD-10-CM | POA: Diagnosis not present

## 2022-10-30 DIAGNOSIS — I10 Essential (primary) hypertension: Secondary | ICD-10-CM | POA: Diagnosis not present

## 2022-10-30 DIAGNOSIS — R2681 Unsteadiness on feet: Secondary | ICD-10-CM | POA: Diagnosis not present

## 2022-10-30 DIAGNOSIS — R600 Localized edema: Secondary | ICD-10-CM | POA: Diagnosis not present

## 2022-10-30 DIAGNOSIS — G8929 Other chronic pain: Secondary | ICD-10-CM | POA: Diagnosis not present

## 2022-10-30 DIAGNOSIS — I4891 Unspecified atrial fibrillation: Secondary | ICD-10-CM | POA: Diagnosis not present

## 2022-10-30 DIAGNOSIS — M19011 Primary osteoarthritis, right shoulder: Secondary | ICD-10-CM | POA: Diagnosis not present

## 2022-10-30 DIAGNOSIS — I2602 Saddle embolus of pulmonary artery with acute cor pulmonale: Secondary | ICD-10-CM | POA: Diagnosis not present

## 2022-10-30 DIAGNOSIS — C8330 Diffuse large B-cell lymphoma, unspecified site: Secondary | ICD-10-CM | POA: Diagnosis not present

## 2022-10-30 DIAGNOSIS — Z7901 Long term (current) use of anticoagulants: Secondary | ICD-10-CM | POA: Diagnosis not present

## 2022-10-30 DIAGNOSIS — E7800 Pure hypercholesterolemia, unspecified: Secondary | ICD-10-CM | POA: Diagnosis not present

## 2022-10-30 DIAGNOSIS — K819 Cholecystitis, unspecified: Secondary | ICD-10-CM | POA: Diagnosis not present

## 2022-10-30 DIAGNOSIS — Z86718 Personal history of other venous thrombosis and embolism: Secondary | ICD-10-CM | POA: Diagnosis not present

## 2022-10-30 NOTE — Patient Outreach (Signed)
  Care Coordination   Collaboration  Visit Note   10/30/2022 Name: Lauren Santana MRN: [id number] DOB: 17-Sep-1951  Lauren Santana is a 71 y.o. year old female who sees Lucianne Lei, MD for primary care. I  collaborated with patients primary care providers office regarding PCS application  What matters to the patients health and wellness today?  Obtain PCS in the home    Goals Addressed             This Visit's Progress    Care Coordination Activities       Care Coordination Interventions: Determined [facility] has yet to receive patients PCS application from her primary care provider Outbound call placed to the patients providers office to follow up on status of completion Re-sent application via fax per office request Attempted to contact patients son to see how patient is doing - voice message left Scheduled follow up call over the next 10 days          SDOH assessments and interventions completed:  No     Care Coordination Interventions:  Yes, provided   Follow up plan: Follow up call scheduled for 12/14    Encounter Outcome:  No Answer   Daneen Schick, BSW, CDP Social Worker, Certified Dementia Practitioner [place] [facility]  Care Coordination [phone]

## 2022-10-31 DIAGNOSIS — R2681 Unsteadiness on feet: Secondary | ICD-10-CM | POA: Diagnosis not present

## 2022-10-31 DIAGNOSIS — Z7901 Long term (current) use of anticoagulants: Secondary | ICD-10-CM | POA: Diagnosis not present

## 2022-10-31 DIAGNOSIS — R7303 Prediabetes: Secondary | ICD-10-CM | POA: Diagnosis not present

## 2022-10-31 DIAGNOSIS — Z86718 Personal history of other venous thrombosis and embolism: Secondary | ICD-10-CM | POA: Diagnosis not present

## 2022-10-31 DIAGNOSIS — K819 Cholecystitis, unspecified: Secondary | ICD-10-CM | POA: Diagnosis not present

## 2022-10-31 DIAGNOSIS — E7800 Pure hypercholesterolemia, unspecified: Secondary | ICD-10-CM | POA: Diagnosis not present

## 2022-10-31 DIAGNOSIS — R600 Localized edema: Secondary | ICD-10-CM | POA: Diagnosis not present

## 2022-10-31 DIAGNOSIS — I10 Essential (primary) hypertension: Secondary | ICD-10-CM | POA: Diagnosis not present

## 2022-10-31 DIAGNOSIS — I2602 Saddle embolus of pulmonary artery with acute cor pulmonale: Secondary | ICD-10-CM | POA: Diagnosis not present

## 2022-10-31 DIAGNOSIS — C8330 Diffuse large B-cell lymphoma, unspecified site: Secondary | ICD-10-CM | POA: Diagnosis not present

## 2022-10-31 DIAGNOSIS — R200 Anesthesia of skin: Secondary | ICD-10-CM | POA: Diagnosis not present

## 2022-10-31 DIAGNOSIS — G8929 Other chronic pain: Secondary | ICD-10-CM | POA: Diagnosis not present

## 2022-10-31 DIAGNOSIS — M19011 Primary osteoarthritis, right shoulder: Secondary | ICD-10-CM | POA: Diagnosis not present

## 2022-10-31 DIAGNOSIS — I4891 Unspecified atrial fibrillation: Secondary | ICD-10-CM | POA: Diagnosis not present

## 2022-11-01 ENCOUNTER — Encounter (HOSPITAL_BASED_OUTPATIENT_CLINIC_OR_DEPARTMENT_OTHER): Payer: Self-pay | Admitting: Emergency Medicine

## 2022-11-01 ENCOUNTER — Emergency Department (HOSPITAL_BASED_OUTPATIENT_CLINIC_OR_DEPARTMENT_OTHER): Payer: Medicare Other | Admitting: Radiology

## 2022-11-01 ENCOUNTER — Other Ambulatory Visit: Payer: Self-pay

## 2022-11-01 ENCOUNTER — Emergency Department (HOSPITAL_BASED_OUTPATIENT_CLINIC_OR_DEPARTMENT_OTHER): Payer: Medicare Other

## 2022-11-01 ENCOUNTER — Emergency Department (HOSPITAL_BASED_OUTPATIENT_CLINIC_OR_DEPARTMENT_OTHER)
Admission: EM | Admit: 2022-11-01 | Discharge: 2022-11-01 | Disposition: A | Discharge status: Home / Self Care | Payer: Medicare Other | Attending: Emergency Medicine | Admitting: Emergency Medicine

## 2022-11-01 DIAGNOSIS — M79605 Pain in left leg: Secondary | ICD-10-CM | POA: Diagnosis not present

## 2022-11-01 DIAGNOSIS — R600 Localized edema: Secondary | ICD-10-CM | POA: Diagnosis not present

## 2022-11-01 DIAGNOSIS — I10 Essential (primary) hypertension: Secondary | ICD-10-CM | POA: Insufficient documentation

## 2022-11-01 DIAGNOSIS — M79604 Pain in right leg: Secondary | ICD-10-CM | POA: Diagnosis not present

## 2022-11-01 DIAGNOSIS — M79606 Pain in leg, unspecified: Secondary | ICD-10-CM | POA: Diagnosis present

## 2022-11-01 LAB — URINALYSIS, ROUTINE W REFLEX MICROSCOPIC
Bilirubin Urine: NEGATIVE
Glucose, UA: NEGATIVE mg/dL
Hgb urine dipstick: NEGATIVE
Ketones, ur: NEGATIVE mg/dL
Nitrite: NEGATIVE
Protein, ur: 30 mg/dL — AB
Specific Gravity, Urine: 1.032 — ABNORMAL HIGH (ref 1.005–1.030)
pH: 6 (ref 5.0–8.0)

## 2022-11-01 MED ORDER — TRAMADOL HCL 50 MG PO TABS: 50.0000 mg | ORAL_TABLET | Freq: Three times a day (TID) | ORAL | 0 refills | Status: DC

## 2022-11-01 MED ORDER — LIDOCAINE 5 % EX PTCH: 1.0000 | MEDICATED_PATCH | CUTANEOUS | 0 refills | Status: DC

## 2022-11-01 MED ORDER — OXYCODONE-ACETAMINOPHEN 5-325 MG PO TABS
1.0000 | ORAL_TABLET | Freq: Once | ORAL | Status: AC
  Administered 2022-11-01: 1 via ORAL
  Filled 2022-11-01: qty 1

## 2022-11-01 NOTE — ED Notes (Signed)
XR bedside.

## 2022-11-01 NOTE — ED Notes (Signed)
Purewick applied per pt and family request. Pt moved up in bed for comfort, denies other needs at this time.

## 2022-11-01 NOTE — ED Triage Notes (Signed)
Pt presents to ED POV. Pt c/o chronic leg pain that worsened thrusday. Pcp wanted pt to ED to check for DTV. Hx of dtv. Pt also wants to be checked for UTI.

## 2022-11-01 NOTE — ED Provider Notes (Signed)
[facility] EMERGENCY DEPT Provider Note   CSN: [id number] Arrival date & time: 11/01/22  1656     History  Chief Complaint  Patient presents with   Leg Pain    Lauren Santana is a 71 y.o. female with medical history significant for VTE/PE, hypertension, A-fib, chronic lower extremity edema here for evaluation of leg pain.  Patient states she has chronic leg pain.  She is recently discharged from SNF.  She has been having home health come out as she is still not very ambulatory.  States he started working with her doing more walking exercises causing her to have increased lower extremity pain.  Her PCP was concerned for her pain and that she possibly had a recurrence of her DVT wanted her to be assessed.  He also requesting urinalysis to check for UTI.  Denies any current symptoms.  Patient has chronic lower extremity swelling which is at baseline.  No fever, numbness, weakness, chest pain, shortness of breath, PND, orthopnea.  No meds PTA.  HPI     Home Medications Prior to Admission medications   Medication Sig Start Date End Date Taking? Authorizing Provider  atorvastatin (LIPITOR) 10 MG tablet Take 10 mg by mouth daily. 09/08/22   [provider]  [employer] VITAMIN B12 1000 MCG tablet Take 1,000 mcg by mouth daily. 07/11/22   [provider]  diclofenac Sodium (VOLTAREN) 1 % GEL Apply 2 g topically daily as needed (pain).  08/11/20   [provider]  furosemide (LASIX) 20 MG tablet Take 20 mg by mouth daily. 08/20/22   [provider]  gabapentin (NEURONTIN) 300 MG capsule Take 1 capsule (300 mg total) by mouth 3 (three) times daily. 08/21/20   Nita Sells, MD  labetalol (NORMODYNE) 100 MG tablet Take 100 mg by mouth 2 (two) times daily. 09/10/22   [provider]  lidocaine (LIDODERM) 5 % Place 1 patch onto the skin daily. Remove & Discard patch within 12 hours or as directed by MD 11/01/22  Yes Cyla Haluska A, PA-C   mupirocin ointment (BACTROBAN) 2 % APPLY TO AFFECTED AREA TWICE A DAY Patient taking differently: Apply 1 Application topically 2 (two) times daily. 07/12/21   Trula Slade, DPM  omeprazole (PRILOSEC) 40 MG capsule Take 40 mg by mouth 2 (two) times daily. 07/10/22   [provider]  potassium chloride 20 MEQ/15ML (10%) SOLN SMARTSIG:3 teaspoon By Mouth Daily 08/19/22   [provider]  rivaroxaban (XARELTO) 20 MG TABS tablet Take 1 tablet (20 mg total) by mouth daily with supper. 08/23/20   Mercy Riding, MD  Silver (MEPILEX AG) [id number]" PADS Apply 1 application  topically daily. 09/09/22   Mickie Hillier, PA-C  sucralfate (CARAFATE) 1 GM/10ML suspension SMARTSIG:2 teaspoon By Mouth 4 Times Daily 09/07/22   [provider]  traMADol (ULTRAM) 50 MG tablet Take 1 tablet (50 mg total) by mouth every 8 (eight) hours. 11/01/22  Yes Markevious Ehmke A, PA-C  triamcinolone (KENALOG) 0.025 % cream Apply 1 Application topically 2 (two) times daily. 09/06/22   [provider]      Allergies    Tylenol [acetaminophen] and Sulfa antibiotics    Review of Systems   Review of Systems  Constitutional: Negative.   HENT: Negative.    Respiratory: Negative.    Cardiovascular:  Positive for leg swelling. Negative for chest pain and palpitations.  Gastrointestinal: Negative.   Genitourinary: Negative.   Musculoskeletal: Negative.   Skin: Negative.  Neurological:  Positive for weakness (chronic generalized). Negative for dizziness, tremors, seizures, syncope, facial asymmetry, speech difficulty, light-headedness, numbness and headaches.  All other systems reviewed and are negative.   Physical Exam Updated Vital Signs BP (!) 157/64   Pulse 74   Temp 98.3 F (36.8 C) (Oral)   Resp 18   LMP  (LMP Unknown)   SpO2 100%  Physical Exam Vitals and nursing note reviewed.  Constitutional:      General: She is not in acute distress.    Appearance: She is well-developed.  She is ill-appearing (chronically ill appearing). She is not toxic-appearing or diaphoretic.  HENT:     Head: Atraumatic.     Nose: Nose normal.     Mouth/Throat:     Mouth: Mucous membranes are moist.  Eyes:     Pupils: Pupils are equal, round, and reactive to light.  Cardiovascular:     Rate and Rhythm: Normal rate.     Pulses:          Dorsalis pedis pulses are detected w/ Doppler on the right side and detected w/ Doppler on the left side.     Heart sounds: Normal heart sounds.  Pulmonary:     Effort: Pulmonary effort is normal. No respiratory distress.     Breath sounds: Normal breath sounds.  Abdominal:     General: Bowel sounds are normal. There is no distension.     Palpations: Abdomen is soft.  Musculoskeletal:        General: Normal range of motion.     Cervical back: Normal range of motion.     Comments: No bony tenderness, full range of motion, lower extremity edema bilateral without erythema or warmth.  Difficulty raising bilateral legs off bed however family states is at baseline.  Skin:    General: Skin is warm and dry.     Comments: No erythema, warmth.  She has chronic wall pressure wounds surrounding erythema, warmth, drainage  Neurological:     Mental Status: She is alert and oriented to person, place, and time. Mental status is at baseline.     Comments: Weakness generalized family states this is at baseline  Psychiatric:        Mood and Affect: Mood normal.    ED Results / Procedures / Treatments   Labs (all labs ordered are listed, but only abnormal results are displayed) Labs Reviewed  URINALYSIS, ROUTINE W REFLEX MICROSCOPIC - Abnormal; Notable for the following components:      Result Value   APPearance HAZY (*)    Specific Gravity, Urine 1.032 (*)    Protein, ur 30 (*)    Leukocytes,Ua TRACE (*)    All other components within normal limits    EKG None  Radiology US Venous Img Lower Bilateral  Result Date: 11/01/2022 CLINICAL DATA:  Right  leg pain. Chronic bilateral leg pain and lymphedema. EXAM: BILATERAL LOWER EXTREMITY VENOUS DOPPLER ULTRASOUND TECHNIQUE: Gray-scale sonography with compression, as well as color and duplex ultrasound, were performed to evaluate the deep venous system(s) from the level of the common femoral vein through the popliteal and proximal calf veins. COMPARISON:  None Available. FINDINGS: VENOUS Left lower extremity: Normal compressibility of the common femoral, superficial femoral, profunda femoris and popliteal veins. Evaluation of bilateral calf veins is limited due to edema. Right lower extremity: Normal compressibility of the common femoral, superficial femoral and profunda femoris. Evaluation of distal femoral and popliteal veins as well as calf veins is limited due to  edema. OTHER None Limitations: Marked subcutaneous soft tissue edema about the distal leg and calf IMPRESSION: 1. No evidence of DVT within the visualized lower extremity veins. 2. Marked subcutaneous soft tissue edema about the distal leg and calf limits evaluation. Electronically Signed   By: Keane Police D.O.   On: 11/01/2022 20:17   DG Femur Min 2 Views Right  Result Date: 11/01/2022 CLINICAL DATA:  Chronic right leg pain EXAM: RIGHT FEMUR 2 VIEWS COMPARISON:  None Available. FINDINGS: Normal alignment. No acute fracture or dislocation. 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 Mild diffuse subcutaneous edema noted. IMPRESSION: 1. 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 Electronically Signed   By: Fidela Salisbury M.D.   On: 11/01/2022 19:34    Procedures Procedures    Medications Ordered in ED Medications  oxyCODONE-acetaminophen (PERCOCET/ROXICET) 5-325 MG per tablet 1 tablet (1 tablet Oral Given 11/01/22 1912)    ED Course/ Medical  Decision Making/ A&P    71 year old, chronically ill with chronic lower extremity edema, prior history of PE, DVT, recently discharged from SNF for evaluation of lower extremity pain.  Patient states her swelling of her extremities is at baseline.  She has no overlying erythema or warmth.  Did state that she noted her bilateral legs have been aching as home health has been coming out to work with her on her ambulation.  No PND, orthopnea, chest pain or shortness of breath.  Her PCP was concerned that she was having some leg swelling per family and requested DVT ultrasound.  Patient states she thinks her pain is due to increase in attempting to ambulate.  She has some chronic appearing stasis wounds however no drainage, induration.  Did states she developed some left lateral femur pain after being placed in the wheelchair earlier today which she describes as a "pinching" sensation.  She has no overlying skin changes to suggest infectious process, her compartments are soft.  She is neurovascularly intact.  Range of motion at hip however does have some difficulty lifting legs off the bed which she states is chronic.  Plan on ultrasound, x-ray.  Family does request urinalysis to assess for UTI however she is currently asymptomatic.  Labs and imaging personally viewed and  interpreted:  UA negative for infection Ultrasound negative for DVT however limited due to edema Xray right femur with chronic arthritic changes however no acute fracture, dislocation   I discussed results with patient, family in room.  I suspect her pain is likely due to increased ambulation which she has not been used to.  She remains neurovascularly intact.  At this time I low suspicion for ischemia, VTE, infectious process, septic joint, occult fracture, dislocation, acute neurosurgical emergency such as cauda equina, discitis, osteomyelitis, bacterial infectious process, rhabdomyolysis, myositis, IVC occlusion, fluid overload.  Of  note patient does states she was previously on tramadol for chronic lower extremity pain however discontinued while she was in the SNF.  DC home with symptomatic management and close outpatient follow-up  The patient has been appropriately medically screened and/or stabilized in the ED. I have low suspicion for any other emergent medical condition which would require further screening, evaluation or treatment in the ED or require inpatient management.  Patient is hemodynamically stable and in no acute distress.  Patient able to ambulate in department prior to ED.  Evaluation  does not show acute pathology that would require ongoing or additional emergent interventions while in the emergency department or further inpatient treatment.  I have discussed the diagnosis with the patient and answered all questions.  Pain is been managed while in the emergency department and patient has no further complaints prior to discharge.  Patient is comfortable with plan discussed in room and is stable for discharge at this time.  I have discussed strict return precautions for returning to the emergency department.  Patient was encouraged to follow-up with PCP/specialist refer to at discharge.                            Medical Decision Making Amount and/or Complexity of Data Reviewed Independent Historian: caregiver External Data Reviewed: labs, radiology and notes. Labs: ordered. Decision-making details documented in ED Course. Radiology: ordered and independent interpretation performed. Decision-making details documented in ED Course.  Risk OTC drugs. Prescription drug management. Parenteral controlled substances. Decision regarding hospitalization. Diagnosis or treatment significantly limited by social determinants of health.          Final Clinical Impression(s) / ED Diagnoses Final diagnoses:  Pain in both lower extremities    Rx / DC Orders ED Discharge Orders          Ordered     traMADol (ULTRAM) 50 MG tablet  Every 8 hours        11/01/22 2055    lidocaine (LIDODERM) 5 %  Every 24 hours        11/01/22 2055              Elisa Sorlie A, PA-C 11/01/22 2056    Regan Lemming, MD 11/02/22 0013

## 2022-11-01 NOTE — ED Notes (Signed)
Ultrasound bedside.

## 2022-11-01 NOTE — Discharge Instructions (Signed)
Take the medications as prescribed.  Make sure to follow-up with your primary care provider  Return for new or worsening symptoms.

## 2022-11-04 DIAGNOSIS — E7800 Pure hypercholesterolemia, unspecified: Secondary | ICD-10-CM | POA: Diagnosis not present

## 2022-11-04 DIAGNOSIS — Z7901 Long term (current) use of anticoagulants: Secondary | ICD-10-CM | POA: Diagnosis not present

## 2022-11-04 DIAGNOSIS — R2681 Unsteadiness on feet: Secondary | ICD-10-CM | POA: Diagnosis not present

## 2022-11-04 DIAGNOSIS — Z86718 Personal history of other venous thrombosis and embolism: Secondary | ICD-10-CM | POA: Diagnosis not present

## 2022-11-04 DIAGNOSIS — R200 Anesthesia of skin: Secondary | ICD-10-CM | POA: Diagnosis not present

## 2022-11-04 DIAGNOSIS — R7303 Prediabetes: Secondary | ICD-10-CM | POA: Diagnosis not present

## 2022-11-04 DIAGNOSIS — G8929 Other chronic pain: Secondary | ICD-10-CM | POA: Diagnosis not present

## 2022-11-04 DIAGNOSIS — K819 Cholecystitis, unspecified: Secondary | ICD-10-CM | POA: Diagnosis not present

## 2022-11-04 DIAGNOSIS — R600 Localized edema: Secondary | ICD-10-CM | POA: Diagnosis not present

## 2022-11-04 DIAGNOSIS — C8330 Diffuse large B-cell lymphoma, unspecified site: Secondary | ICD-10-CM | POA: Diagnosis not present

## 2022-11-04 DIAGNOSIS — I4891 Unspecified atrial fibrillation: Secondary | ICD-10-CM | POA: Diagnosis not present

## 2022-11-04 DIAGNOSIS — M19011 Primary osteoarthritis, right shoulder: Secondary | ICD-10-CM | POA: Diagnosis not present

## 2022-11-04 DIAGNOSIS — I2602 Saddle embolus of pulmonary artery with acute cor pulmonale: Secondary | ICD-10-CM | POA: Diagnosis not present

## 2022-11-04 DIAGNOSIS — I10 Essential (primary) hypertension: Secondary | ICD-10-CM | POA: Diagnosis not present

## 2022-11-06 ENCOUNTER — Ambulatory Visit: Payer: Self-pay

## 2022-11-06 NOTE — Patient Instructions (Addendum)
Visit Information  Thank you for taking time to visit with me today. Please don't hesitate to contact me if I can be of assistance to you.   Following are the goals we discussed today:   Goals Addressed             This Visit's Progress    COMPLETED: Care Coordination Activities       Care Coordination Interventions: Third unsuccessful outbound call placed to the patients son [name] by Care Coordination team, voice message left requesting a return call Spoke with Ernestine with Dr. Fransico Setters office to confirm PCS application was received via fax Ernestine indicates the fax was received and the patients provider is working to complete this in order to fax to [place] Provided SW contact number should the provider need to contact SW for assistance No further assistance needed at this time considering the provider will fax the PCS form to [place] who will contact the patient directly to arrange an in home assessment for caregiver services SW is available should the patient and/or her son return my call          If you are experiencing a Mental Health or [place] or need someone to talk to, please go to [facility] [place] [phone]) call [phone]   No further follow up required: Your provider will complete and submit your PCS application to [facility] in an attempt to obtain an in home caregiver. Please contact me as needed.  Daneen Schick, BSW, CDP Social Worker, Certified Dementia Practitioner [place] [facility]  Care Coordination [phone]

## 2022-11-06 NOTE — Patient Outreach (Addendum)
  Care Coordination   Collaboration  Visit Note   11/06/2022 Name: ARRIYANA RODELL MRN: [id number] DOB: 1951-06-19  ALEGANDRA SOMMERS is a 71 y.o. year old female who sees Lucianne Lei, MD for primary care. I  collaborated with the patients primary care providers office.  What matters to the patients health and wellness today?  Unable to make contact with the patient    Goals Addressed             This Visit's Progress    COMPLETED: Care Coordination Activities       Care Coordination Interventions: Third unsuccessful outbound call placed to the patients son [name] by Care Coordination team, voice message left requesting a return call Spoke with Ernestine with Dr. Fransico Setters office to confirm PCS application was received via fax Ernestine indicates the fax was received and the patients provider is working to complete this in order to fax to [place] Provided SW contact number should the provider need to contact SW for assistance No further assistance needed at this time considering the provider will fax the PCS form to [place] who will contact the patient directly to arrange an in home assessment for caregiver services SW is available should the patient and/or her son return my call          SDOH assessments and interventions completed:  No     Care Coordination Interventions:  Yes, provided   Follow up plan: No further intervention required.   Encounter Outcome:  No Answer   Daneen Schick, BSW, CDP Social Worker, Certified Dementia Practitioner [place] [facility]  Care Coordination [phone]

## 2022-11-07 DIAGNOSIS — K819 Cholecystitis, unspecified: Secondary | ICD-10-CM | POA: Diagnosis not present

## 2022-11-07 DIAGNOSIS — I2602 Saddle embolus of pulmonary artery with acute cor pulmonale: Secondary | ICD-10-CM | POA: Diagnosis not present

## 2022-11-07 DIAGNOSIS — C8330 Diffuse large B-cell lymphoma, unspecified site: Secondary | ICD-10-CM | POA: Diagnosis not present

## 2022-11-07 DIAGNOSIS — R600 Localized edema: Secondary | ICD-10-CM | POA: Diagnosis not present

## 2022-11-07 DIAGNOSIS — R2681 Unsteadiness on feet: Secondary | ICD-10-CM | POA: Diagnosis not present

## 2022-11-07 DIAGNOSIS — I10 Essential (primary) hypertension: Secondary | ICD-10-CM | POA: Diagnosis not present

## 2022-11-07 DIAGNOSIS — E7800 Pure hypercholesterolemia, unspecified: Secondary | ICD-10-CM | POA: Diagnosis not present

## 2022-11-07 DIAGNOSIS — R7303 Prediabetes: Secondary | ICD-10-CM | POA: Diagnosis not present

## 2022-11-07 DIAGNOSIS — R200 Anesthesia of skin: Secondary | ICD-10-CM | POA: Diagnosis not present

## 2022-11-07 DIAGNOSIS — Z7901 Long term (current) use of anticoagulants: Secondary | ICD-10-CM | POA: Diagnosis not present

## 2022-11-07 DIAGNOSIS — M19011 Primary osteoarthritis, right shoulder: Secondary | ICD-10-CM | POA: Diagnosis not present

## 2022-11-07 DIAGNOSIS — I4891 Unspecified atrial fibrillation: Secondary | ICD-10-CM | POA: Diagnosis not present

## 2022-11-07 DIAGNOSIS — Z86718 Personal history of other venous thrombosis and embolism: Secondary | ICD-10-CM | POA: Diagnosis not present

## 2022-11-07 DIAGNOSIS — G8929 Other chronic pain: Secondary | ICD-10-CM | POA: Diagnosis not present

## 2022-11-08 DIAGNOSIS — R609 Edema, unspecified: Secondary | ICD-10-CM | POA: Diagnosis not present

## 2022-11-09 ENCOUNTER — Emergency Department (HOSPITAL_COMMUNITY)
Admission: EM | Admit: 2022-11-09 | Discharge: 2022-11-21 | Disposition: A | Discharge status: Other Acute Inpatient Hospital | Payer: Medicare Other | Attending: Student | Admitting: Student

## 2022-11-09 ENCOUNTER — Other Ambulatory Visit: Payer: Self-pay

## 2022-11-09 DIAGNOSIS — Z7901 Long term (current) use of anticoagulants: Secondary | ICD-10-CM | POA: Diagnosis not present

## 2022-11-09 DIAGNOSIS — U071 COVID-19: Secondary | ICD-10-CM | POA: Diagnosis not present

## 2022-11-09 DIAGNOSIS — M79661 Pain in right lower leg: Secondary | ICD-10-CM | POA: Insufficient documentation

## 2022-11-09 DIAGNOSIS — R600 Localized edema: Secondary | ICD-10-CM

## 2022-11-09 DIAGNOSIS — M79662 Pain in left lower leg: Secondary | ICD-10-CM | POA: Diagnosis not present

## 2022-11-09 DIAGNOSIS — R531 Weakness: Secondary | ICD-10-CM | POA: Diagnosis not present

## 2022-11-09 DIAGNOSIS — G8929 Other chronic pain: Secondary | ICD-10-CM | POA: Insufficient documentation

## 2022-11-09 DIAGNOSIS — M7989 Other specified soft tissue disorders: Secondary | ICD-10-CM | POA: Diagnosis present

## 2022-11-09 LAB — CBC WITH DIFFERENTIAL/PLATELET
Abs Immature Granulocytes: 0.02 10*3/uL (ref 0.00–0.07)
Basophils Absolute: 0 10*3/uL (ref 0.0–0.1)
Basophils Relative: 1 %
Eosinophils Absolute: 0.1 10*3/uL (ref 0.0–0.5)
Eosinophils Relative: 3 %
HCT: 39.2 % (ref 36.0–46.0)
Hemoglobin: 12.3 g/dL (ref 12.0–15.0)
Immature Granulocytes: 0 %
Lymphocytes Relative: 19 %
Lymphs Abs: 0.9 10*3/uL (ref 0.7–4.0)
MCH: 32.7 pg (ref 26.0–34.0)
MCHC: 31.4 g/dL (ref 30.0–36.0)
MCV: 104.3 fL — ABNORMAL HIGH (ref 80.0–100.0)
Monocytes Absolute: 0.8 10*3/uL (ref 0.1–1.0)
Monocytes Relative: 18 %
Neutro Abs: 2.6 10*3/uL (ref 1.7–7.7)
Neutrophils Relative %: 59 %
Platelets: 101 10*3/uL — ABNORMAL LOW (ref 150–400)
RBC: 3.76 MIL/uL — ABNORMAL LOW (ref 3.87–5.11)
RDW: 13.7 % (ref 11.5–15.5)
WBC: 4.5 10*3/uL (ref 4.0–10.5)
nRBC: 0 % (ref 0.0–0.2)

## 2022-11-09 LAB — BASIC METABOLIC PANEL
Anion gap: 7 (ref 5–15)
BUN: 9 mg/dL (ref 8–23)
CO2: 27 mmol/L (ref 22–32)
Calcium: 9.4 mg/dL (ref 8.9–10.3)
Chloride: 106 mmol/L (ref 98–111)
Creatinine, Ser: 0.66 mg/dL (ref 0.44–1.00)
GFR, Estimated: >60 mL/min (ref >60)
Glucose, Bld: 87 mg/dL (ref 70–99)
Potassium: 3.5 mmol/L (ref 3.5–5.1)
Sodium: 140 mmol/L (ref 135–145)

## 2022-11-09 LAB — BRAIN NATRIURETIC PEPTIDE: B Natriuretic Peptide: 39.3 pg/mL (ref 0.0–100.0)

## 2022-11-09 MED ORDER — FUROSEMIDE 40 MG PO TABS
20.0000 mg | ORAL_TABLET | Freq: Once | ORAL | Status: AC
  Administered 2022-11-09: 20 mg via ORAL
  Filled 2022-11-09: qty 1

## 2022-11-09 MED ORDER — RIVAROXABAN 20 MG PO TABS
20.0000 mg | ORAL_TABLET | Freq: Every day | ORAL | Status: DC
  Administered 2022-11-09 – 2022-11-20 (×12): 20 mg via ORAL
  Filled 2022-11-09 (×17): qty 1

## 2022-11-09 MED ORDER — LABETALOL HCL 100 MG PO TABS
100.0000 mg | ORAL_TABLET | Freq: Two times a day (BID) | ORAL | Status: DC
  Administered 2022-11-09 – 2022-11-21 (×23): 100 mg via ORAL
  Filled 2022-11-09 (×25): qty 1

## 2022-11-09 MED ORDER — DICLOFENAC SODIUM 1 % EX GEL: 2.0000 g | Freq: Every day | CUTANEOUS | Status: DC | PRN

## 2022-11-09 MED ORDER — OXYCODONE HCL 5 MG PO TABS
5.0000 mg | ORAL_TABLET | Freq: Four times a day (QID) | ORAL | Status: DC | PRN
  Administered 2022-11-09 – 2022-11-20 (×16): 5 mg via ORAL
  Filled 2022-11-09 (×15): qty 1

## 2022-11-09 MED ORDER — LIDOCAINE 5 % EX PTCH
1.0000 | MEDICATED_PATCH | CUTANEOUS | Status: DC
  Administered 2022-11-09 – 2022-11-19 (×11): 1 via TRANSDERMAL
  Filled 2022-11-09 (×13): qty 1

## 2022-11-09 MED ORDER — GABAPENTIN 300 MG PO CAPS
300.0000 mg | ORAL_CAPSULE | Freq: Three times a day (TID) | ORAL | Status: DC
  Administered 2022-11-09 – 2022-11-21 (×36): 300 mg via ORAL
  Filled 2022-11-09 (×35): qty 1

## 2022-11-09 MED ORDER — TRAMADOL HCL 50 MG PO TABS
50.0000 mg | ORAL_TABLET | Freq: Three times a day (TID) | ORAL | Status: DC
  Administered 2022-11-09 – 2022-11-21 (×36): 50 mg via ORAL
  Filled 2022-11-09 (×35): qty 1

## 2022-11-09 NOTE — Evaluation (Signed)
Physical Therapy Evaluation Patient Details Name: Lauren Santana MRN: [id number] DOB: 25-Jan-1951 Today's Date: 11/09/2022  History of Present Illness  71 y.o. female, history of diffuse large B-cell lymphoma, L TKA 2015, PE, T10/T11 fractures, chronic BLE pain secondary to lymphedema, history of recent ED visit in Oct with c/o bil and required d/c to SNF.  Pt again presents to ED 11/09/22 via EMS with ongoing leg pain.  States that she presented primarily because she lives alone and has too much pain and lower extremity weakness to be able to even stand on her own and alone ambulate to the restroom.  States she feels she is not safe to be home on her own.  No new symptoms.  Has refrained from taking her diuretic for the past several days due to difficulty getting to and from the restroom.  Does have home health aide and PT though she states only a couple of times a week for few hours at a time.  States she does not have any assistance at home.  Clinical Impression  Pt seen in ED.  Pt currently with functional limitations due to the deficits listed below (see PT Problem List). Pt will benefit from skilled PT to increase their independence and safety with mobility to allow discharge to the venue listed below.  Pt reports increased bil LE pain with Lt LE worse then Rt LE.  Pt had received pain medication and RN notifying MD pt still c/o pain.  Pt did attempt to mobilize however unable to stand with only +1 assist from stretcher due to leg pain.  Pt reports she had HHPT however she needs more assist at home during the day.  Pt only has someone who can help her at night.  Pt will needs SNF upon d/c as she present with generalized weakness and poor mobility and does not appear safe to return home alone.  Pt had called her son, [name] while therapist in room and [name] was also agreeable to SNF upon d/c.       Recommendations for follow up therapy are one component of a multi-disciplinary discharge planning  process, led by the attending physician.  Recommendations may be updated based on patient status, additional functional criteria and insurance authorization.  Follow Up Recommendations Skilled nursing-short term rehab (<3 hours/day) Can patient physically be transported by private vehicle: No    Assistance Recommended at Discharge Frequent or constant Supervision/Assistance  Patient can return home with the following  A lot of help with walking and/or transfers;A little help with bathing/dressing/bathroom;Assistance with cooking/housework;Assist for transportation;Help with stairs or ramp for entrance    Equipment Recommendations None recommended by PT  Recommendations for Other Services       Functional Status Assessment Patient has had a recent decline in their functional status and demonstrates the ability to make significant improvements in function in a reasonable and predictable amount of time.     Precautions / Restrictions Precautions Precautions: Fall      Mobility  Bed Mobility Overal bed mobility: Needs Assistance Bed Mobility: Supine to Sit, Sit to Supine     Supine to sit: Max assist Sit to supine: Total assist, +2 for physical assistance   General bed mobility comments: assist for LEs over EOB and turnk upright, +2 for return to bed for safety; required assist for upper and lower body    Transfers Overall transfer level: Needs assistance Equipment used: Rolling walker (2 wheels) Transfers: Sit to/from Stand Sit to Stand: From elevated surface,  Max assist           General transfer comment: attempted x3 however pt unable to lift buttocks from bed due to pain, also from elevated stretcher surface    Ambulation/Gait                  Stairs            Wheelchair Mobility    Modified Rankin (Stroke Patients Only)       Balance Overall balance assessment: Needs assistance Sitting-balance support: Feet unsupported, No upper extremity  supported Sitting balance-Leahy Scale: Fair                                       Pertinent Vitals/Pain Pain Assessment Pain Assessment: 0-10 Pain Score: 9  Pain Location: Bil LEs Pain Descriptors / Indicators: Aching, Sore Pain Intervention(s): Repositioned, Monitored during session, Patient requesting pain meds-RN notified    Home Living Family/patient expects to be discharged to:: Skilled nursing facility Living Arrangements: Alone Available Help at Discharge: Family;Available PRN/intermittently Type of Home: Apartment Home Access: Level entry       Home Layout: One level Home Equipment: Rollator (4 wheels);Wheelchair - manual      Prior Function Prior Level of Function : Needs assist             Mobility Comments: typically ambulatory with rollator but increasing difficulty over last several weeks; was working with [place] however they only visited a couple times per week and she feels she needs more assist at home       Hand Dominance   Dominant Hand: Right    Extremity/Trunk Assessment   Upper Extremity Assessment Upper Extremity Assessment: Generalized weakness    Lower Extremity Assessment Lower Extremity Assessment: Generalized weakness (bil lymphedema, no wrappings present)       Communication   Communication: No difficulties  Cognition Arousal/Alertness: Awake/alert Behavior During Therapy: WFL for tasks assessed/performed Overall Cognitive Status: Within Functional Limits for tasks assessed                                          General Comments      Exercises     Assessment/Plan    PT Assessment Patient needs continued PT services  PT Problem List Decreased activity tolerance;Decreased strength;Decreased knowledge of use of DME;Decreased mobility;Obesity;Pain       PT Treatment Interventions Gait training;DME instruction;Therapeutic activities;Functional mobility training;Balance  training;Patient/family education;Wheelchair mobility training;Therapeutic exercise    PT Goals (Current goals can be found in the Care Plan section)  Acute Rehab PT Goals PT Goal Formulation: With patient Time For Goal Achievement: 11/23/22 Potential to Achieve Goals: Fair    Frequency Min 2X/week     Co-evaluation               AM-PAC PT "6 Clicks" Mobility  Outcome Measure Help needed turning from your back to your side while in a flat bed without using bedrails?: A Lot Help needed moving from lying on your back to sitting on the side of a flat bed without using bedrails?: A Lot Help needed moving to and from a bed to a chair (including a wheelchair)?: Total Help needed standing up from a chair using your arms (e.g., wheelchair or bedside chair)?: Total Help needed to walk in  hospital room?: Total Help needed climbing 3-5 steps with a railing? : Total 6 Click Score: 8    End of Session Equipment Utilized During Treatment: Gait belt Activity Tolerance: Patient limited by pain Patient left: in bed;with call bell/phone within reach Nurse Communication: Mobility status PT Visit Diagnosis: Difficulty in walking, not elsewhere classified (R26.2);Muscle weakness (generalized) (M62.81)    Time: 9450-3888 PT Time Calculation (min) (ACUTE ONLY): 29 min   Charges:   PT Evaluation $PT Eval Low Complexity: 1 Low PT Treatments $Therapeutic Activity: 8-22 mins      Jannette Spanner PT, DPT Physical Therapist Acute Rehabilitation Services Preferred contact method: Secure Chat Weekend Pager Only: [phone] Office: [place] 11/09/2022, 12:07 PM

## 2022-11-09 NOTE — ED Triage Notes (Signed)
Pt arrives from home via [facility]. Bilateral leg/feet edema, unable to walk/put weight on her extremities. Stopped taking diuretic because she is unable to get up to the go bathroom. 200/80, 166/80, hr 85, 97% ra.

## 2022-11-09 NOTE — Progress Notes (Signed)
Awaiting PT eval.  

## 2022-11-09 NOTE — ED Notes (Signed)
Pt and son updated at [facility]

## 2022-11-09 NOTE — NC FL2 (Signed)
[place] MEDICAID FL2 LEVEL OF CARE FORM     IDENTIFICATION  Patient Name: Lauren Santana Birthdate: 05-Aug-1951 Sex: female Admission Date (Current Location): 11/09/2022  [facility] and [place] Number:  Herbalist and Address:  [facility],  [place], [place]      Provider Number: [id number]  Attending Physician Name and Address:  Default, Provider, MD  Relative Name and Phone Number:  [phone] - [name] (son)    Current Level of Care: Hospital Recommended Level of Care: [place] Prior Approval Number:    Date Approved/Denied:   PASRR Number: [id number]  Discharge Plan: SNF    Current Diagnoses: Patient Active Problem List   Diagnosis Date Noted   Anticoagulated 12/11/2020   Cholecystitis, acute 08/16/2020   Abdominal pain 08/16/2020   Severe sepsis ([place]) 08/16/2020   Pressure injury of skin 08/16/2020   Atrial fibrillation ([place]) 07/24/2020   Thrombocytopenia ([place]) 07/24/2020   Anemia of chronic disease 07/24/2020   Chronic pain 07/24/2020   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[place]) 07/24/2020   Pulmonary embolism ([place]) 07/19/2020   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   S/P BSO (bilateral salpingo-oophorectomy) 01/08/2017   S/P hysterectomy 01/08/2017   Tachycardia    Acute deep vein thrombosis (DVT) of right lower extremity ([place]) 02/14/2016   Pulmonary emboli ([place]) 02/13/2016   PE (pulmonary embolism) 02/13/2016   AKI (acute kidney injury) ([place]) 02/13/2016   Pulmonary embolism with acute cor pulmonale (HCC)    Pelvic mass in female    Obesity (BMI 30-39.9) 08/20/2015   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   Essential hypertension, benign 07/23/2014   Edema 07/23/2014   Dyslipidemia 07/23/2014   Lower extremity numbness 07/23/2014   Arthritis of knee 07/17/2014   Postmenopausal bleeding 03/10/2012   Endometriosis of pelvis 01/22/2007    Orientation RESPIRATION  BLADDER Height & Weight     Situation, Place, Self  Normal Continent Weight:   Height:     BEHAVIORAL SYMPTOMS/MOOD NEUROLOGICAL BOWEL NUTRITION STATUS      Continent Diet (Regular)  AMBULATORY STATUS COMMUNICATION OF NEEDS Skin   Total Care Verbally Normal                       Personal Care Assistance Level of Assistance  Bathing, Feeding, Dressing Bathing Assistance: Maximum assistance Feeding assistance: Independent Dressing Assistance: Maximum assistance     Functional Limitations Info  Sight, Hearing, Speech Sight Info: Adequate Hearing Info: Adequate Speech Info: Adequate    SPECIAL CARE FACTORS FREQUENCY  PT (By licensed PT), OT (By licensed OT)     PT Frequency: x5/week OT Frequency: x5/week            Contractures Contractures Info: Not present    Additional Factors Info  Code Status, Allergies, Psychotropic Code Status Info: Full Allergies Info: Tylenol (Acetaminophen)  Sulfa Antibiotics Psychotropic Info: None         Current Medications (11/09/2022):  This is the current hospital active medication list Current Facility-Administered Medications  Medication Dose Route Frequency Provider Last Rate Last Admin   oxyCODONE (Oxy IR/ROXICODONE) immediate release tablet 5 mg  5 mg Oral Q6H PRN Sponseller, Rebekah R, PA-C   5 mg at 11/09/22 0347   Current Outpatient Medications  Medication Sig Dispense Refill   acetaminophen (TYLENOL) 500 MG tablet Take 500 mg by mouth 2 (two) times daily as needed for moderate pain.     atorvastatin (LIPITOR)  10 MG tablet Take 10 mg by mouth daily.     [facility] VITAMIN B12 1000 MCG tablet Take 1,000 mcg by mouth daily.     diclofenac Sodium (VOLTAREN) 1 % GEL Apply 2 g topically daily as needed (pain).      furosemide (LASIX) 20 MG tablet Take 20 mg by mouth daily.     gabapentin (NEURONTIN) 300 MG capsule Take 1 capsule (300 mg total) by mouth 3 (three) times daily. 6 capsule 0   labetalol (NORMODYNE) 100 MG tablet Take  100 mg by mouth 2 (two) times daily.     lidocaine (LIDODERM) 5 % Place 1 patch onto the skin daily. Remove & Discard patch within 12 hours or as directed by MD (Patient taking differently: Place 1 patch onto the skin daily as needed (pain). Remove & Discard patch within 12 hours or as directed by MD) 30 patch 0   mupirocin ointment (BACTROBAN) 2 % APPLY TO AFFECTED AREA TWICE A DAY 22 g 2   potassium chloride 20 MEQ/15ML (10%) SOLN Take 20 mEq by mouth daily.     rivaroxaban (XARELTO) 20 MG TABS tablet Take 1 tablet (20 mg total) by mouth daily with supper. 90 tablet 1   sucralfate (CARAFATE) 1 GM/10ML suspension Take 1 g by mouth 2 (two) times daily.     traMADol (ULTRAM) 50 MG tablet Take 1 tablet (50 mg total) by mouth every 8 (eight) hours. 15 tablet 0   Silver (MEPILEX AG) 4"X4" PADS Apply 1 application  topically daily. 30 each 0     Discharge Medications: Please see discharge summary for a list of discharge medications.  Relevant Imaging Results:  Relevant Lab Results:   Additional Information SSN: [id number]  Kimber Relic, LCSW

## 2022-11-09 NOTE — ED Provider Notes (Signed)
[place] DEPT Provider Note   CSN: [id number] Arrival date & time: 11/09/22  0020     History  Chief Complaint  Patient presents with   Leg Swelling    Lauren Santana is a 71 y.o. female with history of chronic BLE pain secondary to lymphedema who presents via EMS with ongoing leg pain.  States that she presented primarily because she lives alone and has too much pain and lower extremity weakness to be able to even stand on her own and alone ambulate to the restroom.  States she feels she is not safe to be home on her own.  No new symptoms.  Has refrained from taking her diuretic for the past several days due to difficulty getting to and from the restroom.  Does have home health aide and PT though she states only a couple of times a week for few hours at a time.  States she does not have any assistance at home.  HX of endometriosis, dyslipidemia, lymphedema, Afib anticoagulated on xarelto.   HPI     Home Medications Prior to Admission medications   Medication Sig Start Date End Date Taking? Authorizing Provider  acetaminophen (TYLENOL) 500 MG tablet Take 500 mg by mouth 2 (two) times daily as needed for moderate pain.   Yes [provider]  atorvastatin (LIPITOR) 10 MG tablet Take 10 mg by mouth daily. 09/08/22  Yes [provider]  CVS VITAMIN B12 1000 MCG tablet Take 1,000 mcg by mouth daily. 07/11/22  Yes [provider]  diclofenac Sodium (VOLTAREN) 1 % GEL Apply 2 g topically daily as needed (pain).  08/11/20  Yes [provider]  furosemide (LASIX) 20 MG tablet Take 20 mg by mouth daily. 08/20/22  Yes [provider]  gabapentin (NEURONTIN) 300 MG capsule Take 1 capsule (300 mg total) by mouth 3 (three) times daily. 08/21/20  Yes Nita Sells, MD  labetalol (NORMODYNE) 100 MG tablet Take 100 mg by mouth 2 (two) times daily. 09/10/22  Yes [provider]  lidocaine (LIDODERM) 5 %  Place 1 patch onto the skin daily. Remove & Discard patch within 12 hours or as directed by MD Patient taking differently: Place 1 patch onto the skin daily as needed (pain). Remove & Discard patch within 12 hours or as directed by MD 11/01/22  Yes Henderly, Britni A, PA-C  mupirocin ointment (BACTROBAN) 2 % APPLY TO AFFECTED AREA TWICE A DAY 07/12/21  Yes Trula Slade, DPM  potassium chloride 20 MEQ/15ML (10%) SOLN Take 20 mEq by mouth daily. 08/19/22  Yes [provider]  rivaroxaban (XARELTO) 20 MG TABS tablet Take 1 tablet (20 mg total) by mouth daily with supper. 08/23/20  Yes Mercy Riding, MD  sucralfate (CARAFATE) 1 GM/10ML suspension Take 1 g by mouth 2 (two) times daily. 09/07/22  Yes [provider]  traMADol (ULTRAM) 50 MG tablet Take 1 tablet (50 mg total) by mouth every 8 (eight) hours. 11/01/22  Yes Henderly, Britni A, PA-C  Silver (MEPILEX AG) 4"X4" PADS Apply 1 application  topically daily. 09/09/22   Mickie Hillier, PA-C      Allergies    Tylenol [acetaminophen] and Sulfa antibiotics    Review of Systems   Review of Systems  Cardiovascular:  Positive for leg swelling.       BLE pain  Neurological:  Positive for weakness.    Physical Exam Updated Vital Signs BP (!) 144/53   Pulse 88  Temp 99.3 F (37.4 C) (Oral)   Resp (!) 21   LMP  (LMP Unknown)   SpO2 95%  Physical Exam Vitals and nursing note reviewed.  Constitutional:      Appearance: She is obese. She is not ill-appearing or toxic-appearing.  HENT:     Head: Normocephalic and atraumatic.     Mouth/Throat:     Mouth: Mucous membranes are moist.     Pharynx: No oropharyngeal exudate or posterior oropharyngeal erythema.  Eyes:     General:        Right eye: No discharge.        Left eye: No discharge.     Conjunctiva/sclera: Conjunctivae normal.  Cardiovascular:     Rate and Rhythm: Normal rate and regular rhythm.     Pulses: Normal pulses.     Heart sounds: No murmur heard.     Comments: BLE edema, nonpitting, hx of lymphedema Pulmonary:     Effort: Pulmonary effort is normal. No respiratory distress.     Breath sounds: Normal breath sounds. No wheezing or rales.  Abdominal:     General: Bowel sounds are normal. There is no distension.     Palpations: Abdomen is soft.     Tenderness: There is no abdominal tenderness. There is no guarding or rebound.  Musculoskeletal:        General: No deformity.     Cervical back: Neck supple.     Right lower leg: Edema present.     Left lower leg: Edema present.  Skin:    General: Skin is warm and dry.     Capillary Refill: Capillary refill takes less than 2 seconds.  Neurological:     General: No focal deficit present.     Mental Status: She is alert and oriented to person, place, and time. Mental status is at baseline.  Psychiatric:        Mood and Affect: Mood normal.     ED Results / Procedures / Treatments   Labs (all labs ordered are listed, but only abnormal results are displayed) Labs Reviewed  CBC WITH DIFFERENTIAL/PLATELET - Abnormal; Notable for the following components:      Result Value   RBC 3.76 (*)    MCV 104.3 (*)    Platelets 101 (*)    All other components within normal limits  BASIC METABOLIC PANEL  BRAIN NATRIURETIC PEPTIDE    EKG None  Radiology No results found.  Procedures Procedures    Medications Ordered in ED Medications  oxyCODONE (Oxy IR/ROXICODONE) immediate release tablet 5 mg (5 mg Oral Given 11/09/22 0328)  furosemide (LASIX) tablet 20 mg (20 mg Oral Given 11/09/22 0328)    ED Course/ Medical Decision Making/ A&P Clinical Course as of 11/09/22 0631  Sun Nov 09, 2022  0445 Patient unable to ambulate even with assistance due to leg pain/weakness. Will require PT eval and TOC evaluation for boarding.  [RS]    Clinical Course User Index [RS] Aura Dials                           Medical Decision Making 71 year old female presents with bilateral  lower extremity chronic pain and swelling, request for SNF placement due to inability to perform ADLs on her own at home.  HTN on intake, VS otherwise normal. Cardiopulmonary exam is unremarkable. Difficulty lifting BLE off the bed, sensation is normal. Weakness is symmetric. Patient denies saddle anesthesia or  incontinence. NO parasthesias in the legs per patient.   Patient unable to stand or ambulate even with 2 person assist, per RN, secondary to weakness and pain.   Amount and/or Complexity of Data Reviewed Labs: ordered.    Details: CBC, BMP, BNP unremarkable today.   Risk Prescription drug management.   Unable to ambulate or stand independently.  Will require PT eval and to see evaluation for possible SNF placement.  Will board in the ED until that time.  No indication for admission.  Patient with hemodynamically stable.  No further workup warranted in ED as time, clinical concern for emergent underlying etiology that would warrant further ED workup or inpatient management is exceedingly low.  Patient pending PT and OT eval at time of shift change.   Jonell  voiced understanding of her medical evaluation and treatment plan. Each of their questions answered to their expressed satisfaction.   This chart was dictated using voice recognition software, Dragon. Despite the best efforts of this provider to proofread and correct errors, errors may still occur which can change documentation meaning.    Final Clinical Impression(s) / ED Diagnoses Final diagnoses:  None    Rx / DC Orders ED Discharge Orders     None         Aura Dials 11/09/22 0631    Merryl Hacker, MD 11/09/22 2357

## 2022-11-09 NOTE — ED Notes (Signed)
Pt is refusing to work to physical therapy.  When this writer went to speak to the Pt, she reports has has not been cared for appropriately.  Pt reminded we have answered her call light every time she has called out.  She had pain medication at 0900 and we have already cleaned her and changed linens.  This Probation officer ensured her we are working to get her a meal tray ordered as well.    Pt demanding to speak to her son.  Sts "he needs to know how I've been treated."  Pt, again, reminded of all care that has been provided and what is being worked on.  Also, Pt reminded that cooperating w/ PT will get her out of the department faster.   Pt provided w/ phone and son's number dialed.

## 2022-11-09 NOTE — Progress Notes (Signed)
Transition of Care [facility]) - Emergency Department Mini Assessment   Patient Details  Name: Lauren Santana MRN: [id number] Date of Birth: 05/02/1951  [facility] CM/SW Contact:    Kimber Relic, LCSW Phone Number: 11/09/2022, 12:28 PM   Clinical Narrative: This CSW spoke with the pt's son who states he does want his mother to go to rehab due to her being unable to walk. Preferred facility is [facility] but son stated he is okay with her being sent out to [facility]. Son mentioned he has not heard from a provider or nurse regarding where his mother is from a medical standpoint. This CSW reached out to EDP and RN via secure chat to request that someone gives him a call. TOC following. Bed offers pending.   ED Mini Assessment: What brought you to the Emergency Department? : bilateral leg/feet edema.. unable to ambulate  Barriers to Discharge: ED SNF auth     Means of departure: Not know       Patient Contact and Communications Key Contact 1: Son - Lauren Hurst   Spoke with: Lauren Hurst (son) Contact Date: 11/09/22,   Contact time: 1215        CMS Medicare.gov Compare Post Acute Care list provided to:: Legal Guardian Lauren Santana (son/LG)) Choice offered to / list presented to : Adult Children  Admission diagnosis:  weakness, edema Patient Active Problem List   Diagnosis Date Noted   Anticoagulated 12/11/2020   Cholecystitis, acute 08/16/2020   Abdominal pain 08/16/2020   Severe sepsis ([place]) 08/16/2020   Pressure injury of skin 08/16/2020   Atrial fibrillation ([place]) 07/24/2020   Thrombocytopenia ([place]) 07/24/2020   Anemia of chronic disease 07/24/2020   Chronic pain 07/24/2020   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[place]) 07/24/2020   Pulmonary embolism ([place]) 07/19/2020   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   S/P BSO (bilateral salpingo-oophorectomy) 01/08/2017   S/P hysterectomy 01/08/2017   Tachycardia    Acute deep vein thrombosis  (DVT) of right lower extremity ([place]) 02/14/2016   Pulmonary emboli ([place]) 02/13/2016   PE (pulmonary embolism) 02/13/2016   AKI (acute kidney injury) ([place]) 02/13/2016   Pulmonary embolism with acute cor pulmonale (HCC)    Pelvic mass in female    Obesity (BMI 30-39.9) 08/20/2015   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   Essential hypertension, benign 07/23/2014   Edema 07/23/2014   Dyslipidemia 07/23/2014   Lower extremity numbness 07/23/2014   Arthritis of knee 07/17/2014   Postmenopausal bleeding 03/10/2012   Endometriosis of pelvis 01/22/2007   PCP:  Lucianne Lei, MD Pharmacy:   [id number] [place] [id number] [id number]: [phone]: [phone]

## 2022-11-09 NOTE — ED Notes (Addendum)
PT into room, at [facility]

## 2022-11-10 DIAGNOSIS — R600 Localized edema: Secondary | ICD-10-CM | POA: Diagnosis not present

## 2022-11-10 LAB — RESP PANEL BY RT-PCR (RSV, FLU A&B, COVID)  RVPGX2
Influenza A by PCR: NEGATIVE
Influenza B by PCR: NEGATIVE
Resp Syncytial Virus by PCR: NEGATIVE
SARS Coronavirus 2 by RT PCR: POSITIVE — AB

## 2022-11-10 MED ORDER — MOLNUPIRAVIR EUA 200MG CAPSULE
4.0000 | ORAL_CAPSULE | Freq: Two times a day (BID) | ORAL | Status: AC
  Administered 2022-11-10 – 2022-11-15 (×10): 800 mg via ORAL
  Filled 2022-11-10 (×3): qty 4

## 2022-11-10 MED ORDER — ACETAMINOPHEN 325 MG PO TABS
650.0000 mg | ORAL_TABLET | Freq: Four times a day (QID) | ORAL | Status: DC | PRN
  Administered 2022-11-10 – 2022-11-20 (×8): 650 mg via ORAL
  Filled 2022-11-10 (×9): qty 2

## 2022-11-10 MED ORDER — ACETAMINOPHEN 325 MG PO TABS: 650.0000 mg | ORAL_TABLET | Freq: Once | ORAL | Status: DC

## 2022-11-10 NOTE — ED Notes (Signed)
Attempted to call report to the facility. The facility will call back.

## 2022-11-10 NOTE — Progress Notes (Signed)
CSW updated son about mom, CSW also reached out to the facility/ [facility]  to make them aware.

## 2022-11-10 NOTE — Progress Notes (Signed)
The patient has had a positive test will keep updates as they come.

## 2022-11-10 NOTE — Progress Notes (Addendum)
This CSW is confirming with [facility] that there is a bed. Once confirmed, TOC will submit Auth.   Addend @ 12:18 PM This CSW submitted Auth. Auth pending.   Addend @ 3:01 PM The pt's auth was approved. Son and [facility] notified. Pt to d/c today.   Addend @ 3:25 Report given to [place], [place]. Informed EDP, Dr. Jeanell Sparrow that SNF (Per [place], admissions rep) is requesting a rapid Covid test. Dr. Jeanell Sparrow states the pt is running a temp and is requesting RN to re-check temp att.

## 2022-11-11 DIAGNOSIS — R600 Localized edema: Secondary | ICD-10-CM | POA: Diagnosis not present

## 2022-11-11 MED ORDER — MENTHOL 3 MG MT LOZG: 1.0000 | LOZENGE | OROMUCOSAL | Status: DC | PRN

## 2022-11-11 NOTE — ED Provider Notes (Addendum)
Emergency Medicine Observation Re-evaluation Note  Lauren Santana is a 71 y.o. female, seen on rounds today.  Pt initially presented to the ED for complaints of Leg Swelling Currently, the patient is resting.  Physical Exam  BP (!) 144/60   Pulse 89   Temp 98.6 F (37 C) (Oral)   Resp 18   LMP  (LMP Unknown)   SpO2 97%  Physical Exam General: NAD Cardiac: well perfused Lungs: respirations even and unlabored Psych: no agitation  ED Course / MDM  EKG:   I have reviewed the labs performed to date as well as medications administered while in observation.  Recent changes in the last 24 hours include none.   Covid positive. Remainder of labs unremarkable. On room air. Prescribed Molnupiravir.  Of note, pt was supposed to d/c to SNF yesterday until the positive Covid test. The SNF is requesting that she's retested on day 5.   Plan  Current plan is for SW coordinating dispo.        Regan Lemming, MD 11/11/22 [phone]

## 2022-11-11 NOTE — ED Notes (Signed)
Patient is A&O x4 this morning. She took her pills whole. Her bottom was washed and dried and a new purewick was applied. No O2 was needed patient remained in the 90's all night.

## 2022-11-11 NOTE — ED Notes (Signed)
Pt very pleasant and gracious this morning.  Pt repositioned and TV unmuted.  Pt updated on plan of care.

## 2022-11-11 NOTE — ED Notes (Signed)
Pt starting to complain of a scratchy/sore throat.  EDP made aware.  Pt given hot chicken broth, per request.

## 2022-11-11 NOTE — ED Notes (Signed)
Pt repositioned in bed for comfort and meal tray set-up.

## 2022-11-11 NOTE — Progress Notes (Addendum)
Per Narda Rutherford, Admissions rep at [facility] - "Test again on day 5, if negative, pt can admit on day 6. If test is still positive, pt must wait until day 11 to admit."   Addend @ 9:57 AM Notified EDP. Dr. Armandina Gemma via secure chat.

## 2022-11-11 NOTE — ED Notes (Signed)
Pt readjusted in bed and linens straightened.

## 2022-11-11 NOTE — Progress Notes (Signed)
This CSW provided the pt's son, [name], with an update. [name] inquired about where the pt will be until she can go to SNF. This CSW informed [name] that she will remain in the ED until she is able to go to SNF pending a negative covid test. [name] agreed that is is unsafe for her to go home. [name] inquired about Ins Auth. This CSW verified with supervisor that we would have to resubmit Auth if it expires before she goes to SNF.

## 2022-11-12 DIAGNOSIS — R600 Localized edema: Secondary | ICD-10-CM | POA: Diagnosis not present

## 2022-11-12 MED ORDER — CALCIUM CARBONATE ANTACID 500 MG PO CHEW
1.0000 | CHEWABLE_TABLET | Freq: Once | ORAL | Status: AC
  Administered 2022-11-12: 200 mg via ORAL
  Filled 2022-11-12: qty 1

## 2022-11-12 NOTE — ED Notes (Signed)
Pt is sleeping no signs of distress noted respirations are easy.

## 2022-11-12 NOTE — ED Notes (Signed)
Alert and orient staff at bedside to assist with ADL.

## 2022-11-12 NOTE — ED Notes (Signed)
Sleeping  easily awakened currently denies any needs o2 sat 98% HR 84.

## 2022-11-12 NOTE — ED Provider Notes (Signed)
Emergency Medicine Observation Re-evaluation Note  Lauren Santana is a 71 y.o. female, seen on rounds today.  Pt initially presented to the ED for complaints of Leg Swelling Currently, the patient is stable, awaiting placement, surprised by her COVID diagnosis.   Physical Exam  BP 135/63 (BP Location: Right Arm)   Pulse 81   Temp (!) 100.7 F (38.2 C) (Oral)   Resp (!) 22   LMP  (LMP Unknown)   SpO2 99%  Physical Exam General: NAD Cardiac: RR Lungs: even, unlabored Psych: normal affect  ED Course / MDM  EKG:   I have reviewed the labs performed to date as well as medications administered while in observation.  Recent changes in the last 24 hours include none.  Continuing molnupiravir, will need retest of COVID day 5 12/23.   Plan  Current plan is for SNF placement.    Gareth Morgan, MD 11/12/22 [phone]

## 2022-11-13 DIAGNOSIS — R600 Localized edema: Secondary | ICD-10-CM | POA: Diagnosis not present

## 2022-11-13 MED ORDER — CALCIUM CARBONATE ANTACID 500 MG PO CHEW
1.0000 | CHEWABLE_TABLET | Freq: Once | ORAL | Status: AC
  Administered 2022-11-13: 200 mg via ORAL
  Filled 2022-11-13: qty 1

## 2022-11-13 NOTE — Progress Notes (Addendum)
Physical Therapy Treatment Patient Details Name: Lauren Santana MRN: [id number] DOB: 1950/12/19 Today's Date: 11/13/2022   History of Present Illness 71 y.o. female, history of diffuse large B-cell lymphoma, L TKA 2015, PE, T10/T11 fractures, chronic BLE pain secondary to lymphedema, history of recent ED visit in Oct with c/o bil and required d/c to SNF.  Pt again presents to ED 11/09/22 via EMS with ongoing leg pain and inability to ambulate and care for self.    PT Comments    The patient demonstrates improved mobility. Patient requires 2 to sit up, Stood at Rw and able to side step along the bed x 4 steps. Patient pleased that she can stand and take a few steps.   Recommendations for follow up therapy are one component of a multi-disciplinary discharge planning process, led by the attending physician.  Recommendations may be updated based on patient status, additional functional criteria and insurance authorization.  Follow Up Recommendations  Skilled nursing-short term rehab (<3 hours/day) Can patient physically be transported by private vehicle: No   Assistance Recommended at Discharge Frequent or constant Supervision/Assistance  Patient can return home with the following A lot of help with walking and/or transfers;A little help with bathing/dressing/bathroom;Assistance with cooking/housework;Assist for transportation;Help with stairs or ramp for entrance   Equipment Recommendations  None recommended by PT    Recommendations for Other Services       Precautions / Restrictions Precautions Precautions: Fall     Mobility  Bed Mobility   Bed Mobility: Supine to Sit, Sit to Supine     Supine to sit: Max assist Sit to supine: Total assist, +2 for physical assistance   General bed mobility comments: assist for LEs over EOB and trunk upright, +2 for return to bed for safety; required assist for upper and lower body    Transfers Overall transfer level: Needs  assistance Equipment used: Rolling walker (2 wheels) Transfers: Sit to/from Stand             General transfer comment: assisted to stand at RW x 2, able to side step  x 4 along the bed, holding onto RW    Ambulation/Gait                   Stairs             Wheelchair Mobility    Modified Rankin (Stroke Patients Only)       Balance Overall balance assessment: Needs assistance Sitting-balance support: Feet unsupported, No upper extremity supported Sitting balance-Leahy Scale: Fair     Standing balance support: Bilateral upper extremity supported, Reliant on assistive device for balance, During functional activity Standing balance-Leahy Scale: Poor                              Cognition Arousal/Alertness: Awake/alert Behavior During Therapy: WFL for tasks assessed/performed Overall Cognitive Status: Within Functional Limits for tasks assessed                                          Exercises      General Comments        Pertinent Vitals/Pain Pain Assessment Faces Pain Scale: Hurts little more Pain Location: right leg Pain Descriptors / Indicators: Aching, Sore Pain Intervention(s): Monitored during session, Premedicated before session    Home Living  Prior Function            PT Goals (current goals can now be found in the care plan section) Progress towards PT goals: Progressing toward goals    Frequency    Min 2X/week      PT Plan Current plan remains appropriate    Co-evaluation              AM-PAC PT "6 Clicks" Mobility   Outcome Measure  Help needed turning from your back to your side while in a flat bed without using bedrails?: A Lot Help needed moving from lying on your back to sitting on the side of a flat bed without using bedrails?: A Lot Help needed moving to and from a bed to a chair (including a wheelchair)?: Total Help needed standing up  from a chair using your arms (e.g., wheelchair or bedside chair)?: Total Help needed to walk in hospital room?: Total Help needed climbing 3-5 steps with a railing? : Total 6 Click Score: 8    End of Session Equipment Utilized During Treatment: Gait belt Activity Tolerance: Patient tolerated treatment well Patient left: in bed;with call bell/phone within reach Nurse Communication: Mobility status PT Visit Diagnosis: Difficulty in walking, not elsewhere classified (R26.2);Muscle weakness (generalized) (M62.81)     Time: 1005-1030 PT Time Calculation (min) (ACUTE ONLY): 25 min  Charges:  $Therapeutic Activity: 23-37 mins                     [place] Office [phone] Weekend [phone]    Claretha Cooper 11/13/2022, 12:10 PM

## 2022-11-13 NOTE — ED Notes (Signed)
Remains awake request staff assistance in getting nightstand denies other needs.

## 2022-11-13 NOTE — ED Notes (Addendum)
C/o mild indigestion Tums x1 tab given. Mrs Kreiter reports feeling mild to moderate anxiety related to up coming rehab placement

## 2022-11-13 NOTE — ED Notes (Signed)
Son in to see Mrs. Coard brought her snack items which were inspected prior to giving to pt. Conditions stable affect bright mood stable

## 2022-11-13 NOTE — ED Notes (Signed)
Patient has been alert and cooperative this shift. Patient medication compliant. Patient appropriate with staff. Patient needs help with ADLs

## 2022-11-13 NOTE — ED Notes (Signed)
Pt requesting tums for indigestions 1 additional tab given Mrs. [place] continues to express concern about her up coming rehab visit.

## 2022-11-14 DIAGNOSIS — R600 Localized edema: Secondary | ICD-10-CM | POA: Diagnosis not present

## 2022-11-14 LAB — SARS CORONAVIRUS 2 BY RT PCR: SARS Coronavirus 2 by RT PCR: POSITIVE — AB

## 2022-11-14 NOTE — ED Provider Notes (Signed)
Emergency Medicine Observation Re-evaluation Note  Lauren Santana is a 71 y.o. female, seen on rounds today.  Pt initially presented to the ED for complaints of Leg Swelling Currently, the patient is awaiting placement..  Physical Exam  BP (!) 124/58 (BP Location: Left Arm)   Pulse 72   Temp 99.1 F (37.3 C) (Oral)   Resp 18   LMP  (LMP Unknown)   SpO2 96%  Physical Exam General: No acute distress Cardiac: Regular rate Lungs: Clear to auscultation no respiratory distress Psych: Baseline  ED Course / MDM  EKG:   I have reviewed the labs performed to date as well as medications administered while in observation.  Recent changes in the last 24 hours include COVID positive.  Facility is requesting repeat COVID test.  It has been ordered.  Patient will continue her COVID antiviral medicine.  Originally scheduled to have COVID rechecked on 23 December.  But they have requested to do it today so it has been ordered..  Plan  Current plan is for nursing home placement.  Facility is requested COVID testing today originally it was ordered for yesterday.  So it is being done today.    Fredia Sorrow, MD 11/14/22 1057

## 2022-11-14 NOTE — Progress Notes (Signed)
This CSW has requested the pt be retested for Covid. EDP and RN asked via secure chat.

## 2022-11-14 NOTE — Progress Notes (Addendum)
This CSW spoke to the pt's son and provided him with an update. Pt is still covid+ after retest was completed today. This CSW notified admissions rep at [facility], Narda Rutherford, also. Pt will admit to [facility] on day 11. This CSW reached out to Clarise Cruz, RN, to request a recliner be put in the pt's room per son's request.

## 2022-11-14 NOTE — ED Notes (Signed)
Pt was calm, cooperative, and medication compliant this shift. At 11:15 AM, we bathed pt and changed bedlinens. Pt needs 2 people to assist her to stand and pivot. NT and I assisted pt to the bedside recliner at 14:00. Pt sat in the recliner until 15:40. Pt needed assistance with bathing and toileting. Pt was repositioned for comfort throughout the shift. Two new purwicks applied during this shift. Pt requested purwick removal at 18:00.

## 2022-11-15 DIAGNOSIS — R600 Localized edema: Secondary | ICD-10-CM | POA: Diagnosis not present

## 2022-11-15 NOTE — ED Provider Notes (Signed)
Emergency Medicine Observation Re-evaluation Note  Lauren Santana is a 71 y.o. female, seen on rounds today.  Pt initially presented to the ED for complaints of Leg Swelling Currently, the patient is resting quietly.  Physical Exam  BP (!) 127/58 (BP Location: Left Arm)   Pulse 73   Temp 98.8 F (37.1 C) (Oral)   Resp 18   LMP  (LMP Unknown)   SpO2 100%  Physical Exam General: No acute distress Cardiac: Well-perfused Lungs: Nonlabored Psych: Cooperative  ED Course / MDM  EKG:   I have reviewed the labs performed to date as well as medications administered while in observation.  Recent changes in the last 24 hours include awaiting placement.  Plan  Current plan is for nursing home placement.    Hayden Rasmussen, MD 11/15/22 1800

## 2022-11-15 NOTE — ED Notes (Signed)
Patient alert this shift. Patient cooperative.  Patient medication compliant compliant. Patient needs help with ADLs.

## 2022-11-16 DIAGNOSIS — R600 Localized edema: Secondary | ICD-10-CM | POA: Diagnosis not present

## 2022-11-16 NOTE — ED Notes (Signed)
Pt was calm, cooperative, and medication compliant this shift. At 11:48 AM, we bathed pt, removed purwick, changed bedlinens, and assisted her to a recliner. Pt needs 2 people to assist her with standing and pivoting. We assisted pt to the bed at 16:12 and reapplied purwick. Pt was repositioned for comfort throughout the shift. No bowel movement this shift.

## 2022-11-16 NOTE — ED Provider Notes (Signed)
Emergency Medicine Observation Re-evaluation Note  Lauren Santana is a 71 y.o. female, seen on rounds today.  Pt initially presented to the ED for complaints of Leg Swelling Currently, the patient is calm and relaxing.  Physical Exam  BP 131/62 (BP Location: Left Arm)   Pulse 89   Temp 98.9 F (37.2 C) (Oral)   Resp 19   LMP  (LMP Unknown)   SpO2 100%  Physical Exam General:, Resting no acute distress Cardiac: Regular rate Lungs: Nonlabored Psych: Calm  ED Course / MDM  EKG:   I have reviewed the labs performed to date as well as medications administered while in observation.  Recent changes in the last 24 hours include awaiting placement.  Plan  Current plan is for nursing home placement.    Elgie Congo, MD 11/16/22 [phone]

## 2022-11-17 DIAGNOSIS — R600 Localized edema: Secondary | ICD-10-CM | POA: Diagnosis not present

## 2022-11-17 MED ORDER — CALCIUM CARBONATE ANTACID 500 MG PO CHEW
1.0000 | CHEWABLE_TABLET | Freq: Once | ORAL | Status: AC
  Administered 2022-11-17: 200 mg via ORAL
  Filled 2022-11-17: qty 1

## 2022-11-17 NOTE — ED Provider Notes (Signed)
Emergency Medicine Observation Re-evaluation Note  Lauren Santana is a 71 y.o. female, seen on rounds today.  Pt initially presented to the ED for complaints of Leg Swelling Currently, the patient is awaiting SNF placement.  Placement complicated by COVID 19 diagnosis, retest still positive 12/22.  Sleeping at time of my evaluation.  Physical Exam  BP (!) 116/58   Pulse 74   Temp 98.7 F (37.1 C) (Oral)   Resp 18   LMP  (LMP Unknown)   SpO2 94%  Physical Exam General: NAD  Cardiac: RR Lungs: even,unlabored Psych: NA  ED Course / MDM  EKG:   I have reviewed the labs performed to date as well as medications administered while in observation.  Recent changes in the last 24 hours include none.  Plan  Current plan is for placement.    Gareth Morgan, MD 11/17/22 2348

## 2022-11-17 NOTE — ED Notes (Signed)
Currently appear asleep with no signs of distress or disturbed sleep noted. Respiration are easy skin color is appropriate for ethnicity.

## 2022-11-18 DIAGNOSIS — R600 Localized edema: Secondary | ICD-10-CM | POA: Diagnosis not present

## 2022-11-18 NOTE — ED Notes (Signed)
Currently wide awake alert x4 denies pain or discomfort repositioned in bed and needs addressed. Affect bright mood stable.

## 2022-11-18 NOTE — Social Work (Signed)
CSW spoke to facility as the patient can not return until Friday the 29 th which will be day 11. CSW also spoke to the patients son and advised him that we also will have to do other AUTH before the patient can be DC. TOC will continue to follow patient.

## 2022-11-18 NOTE — ED Provider Notes (Signed)
Emergency Medicine Observation Re-evaluation Note  Lauren Santana is a 71 y.o. female, seen on rounds today.  Pt initially presented to the ED for complaints of Leg Swelling Currently, the patient is sleeping.  Physical Exam  BP (!) 127/53 (BP Location: Left Arm)   Pulse 73   Temp 98.8 F (37.1 C) (Oral)   Resp 18   LMP  (LMP Unknown)   SpO2 94%  Physical Exam General: resting Cardiac: regular rate Lungs: normal effort Psych:   ED Course / MDM  EKG:   I have reviewed the labs performed to date as well as medications administered while in observation.  Recent changes in the last 24 hours include no acute events.  Plan  Current plan is for SNF placement. Positive for covid, delaying dispo.    Dorie Rank, MD 11/18/22 [phone]

## 2022-11-19 DIAGNOSIS — R600 Localized edema: Secondary | ICD-10-CM | POA: Diagnosis not present

## 2022-11-19 NOTE — ED Notes (Signed)
Lying in bed no signs of distress and no sleep disturbance noted, respirations appear easy.

## 2022-11-19 NOTE — ED Provider Notes (Signed)
Emergency Medicine Observation Re-evaluation Note  Lauren Santana is a 71 y.o. female, seen on rounds today.  Pt initially presented to the ED for complaints of Leg Swelling also was found to have COVID-19  Currently, the patient is resting comfortably.  Physical Exam  BP (!) 118/50 (BP Location: Left Arm)   Pulse 81   Temp 98.8 F (37.1 C) (Oral)   Resp 16   LMP  (LMP Unknown)   SpO2 95%  Physical Exam General: In no acute distress Lungs: Normal work of breathing Psych: Calm  ED Course / MDM  EKG:   I have reviewed the labs performed to date as well as medications administered while in observation.  Recent changes in the last 24 hours include patient should be able to be placed on Friday, 11/21/2022 into a SNF.  Plan  Current plan is for placement.    Fransico Meadow, MD 11/19/22 [phone]

## 2022-11-19 NOTE — ED Notes (Signed)
Remains asleep with no signs of distress respirations appear easy and WNL.

## 2022-11-19 NOTE — Progress Notes (Signed)
Physical Therapy Treatment Patient Details Name: Lauren Santana MRN: [id number] DOB: 06/03/1951 Today's Date: 11/19/2022   History of Present Illness 71 y.o. female, history of diffuse large B-cell lymphoma, L TKA 2015, PE, T10/T11 fractures, chronic BLE pain secondary to lymphedema, history of recent ED visit in Oct with c/o bil and required d/c to SNF.  Pt again presents to ED 11/09/22 via EMS with ongoing leg pain and inability to ambulate and care for self.    PT Comments    Patient making good progress with mobility and able to sit up to EOB with min assist today. Multiple sit<>stands completed with Mod assist, +2 for safety to power up and steady. Pt has anterior lean in standing and required cues to encourage upright posture. Stand step transfer completed to move bed>recliner and ultimately to [facility] for BM. EOS pt still on [facility] as she feels she needed more time. RN/NT able to assist back to recliner for transfer. Continue to recommend ST rehab at SNF, will continue to progress as able.    Recommendations for follow up therapy are one component of a multi-disciplinary discharge planning process, led by the attending physician.  Recommendations may be updated based on patient status, additional functional criteria and insurance authorization.  Follow Up Recommendations  Skilled nursing-short term rehab (<3 hours/day) Can patient physically be transported by private vehicle: No   Assistance Recommended at Discharge Frequent or constant Supervision/Assistance  Patient can return home with the following A lot of help with walking and/or transfers;A little help with bathing/dressing/bathroom;Assistance with cooking/housework;Assist for transportation;Help with stairs or ramp for entrance   Equipment Recommendations  None recommended by PT    Recommendations for Other Services       Precautions / Restrictions Precautions Precautions: Fall Precaution Comments: 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 Pt able to use bed features to improve ability to sit up and able to scoot to EOB with bil UE use.    Transfers Overall transfer level: Needs assistance Equipment used: Rolling walker (2 wheels) Transfers: Sit to/from Stand, Bed to chair/wheelchair/BSC Sit to Stand: Mod assist, +2 safety/equipment, +2 physical assistance, Min assist   Step pivot transfers: Mod assist, Min assist, +2 physical assistance, +2 safety/equipment       General transfer comment: Mod +2 to rise to RW from EOB. Pt completed 2x from EOB for bathing/wash up from NT in room. Mod assist to guide RW and pt's steps to move bed>chair. Pt completed 4x sit<>stand from recliner and then requested BSC for BM. Mod Assist +2 for safety to guide turn to commode and pt compelted 3x sit<>stand during toileting to empty.    Ambulation/Gait                   Stairs             Wheelchair Mobility    Modified Rankin (Stroke Patients Only)       Balance Overall balance assessment: Needs assistance Sitting-balance support: Feet unsupported, No upper extremity supported Sitting balance-Leahy Scale: Fair     Standing balance support: Bilateral upper extremity supported, Reliant on assistive device for balance, During functional activity Standing balance-Leahy Scale: Poor  Cognition Arousal/Alertness: Awake/alert Behavior During Therapy: WFL for tasks assessed/performed Overall Cognitive Status: Within Functional Limits for tasks assessed                                          Exercises      General Comments        Pertinent Vitals/Pain Pain  Assessment Pain Assessment: No/denies pain    Home Living                          Prior Function            PT Goals (current goals can now be found in the care plan section) Acute Rehab PT Goals Patient Stated Goal: Rehab to regain strength and IND PT Goal Formulation: With patient Time For Goal Achievement: 11/23/22 Potential to Achieve Goals: Fair Progress towards PT goals: Progressing toward goals    Frequency    Min 2X/week      PT Plan Current plan remains appropriate    Co-evaluation              AM-PAC PT "6 Clicks" Mobility   Outcome Measure  Help needed turning from your back to your side while in a flat bed without using bedrails?: A Little Help needed moving from lying on your back to sitting on the side of a flat bed without using bedrails?: A Little Help needed moving to and from a bed to a chair (including a wheelchair)?: A Lot Help needed standing up from a chair using your arms (e.g., wheelchair or bedside chair)?: A Lot Help needed to walk in hospital room?: A Lot Help needed climbing 3-5 steps with a railing? : Total 6 Click Score: 13    End of Session Equipment Utilized During Treatment: Gait belt Activity Tolerance: Patient tolerated treatment well Patient left: Other (comment);with call bell/phone within reach (on [facility], NT/RN aware and have line of sight with pt) Nurse Communication: Mobility status PT Visit Diagnosis: Difficulty in walking, not elsewhere classified (R26.2);Muscle weakness (generalized) (M62.81)     Time: 8527-7824 PT Time Calculation (min) (ACUTE ONLY): 37 min  Charges:  $Therapeutic Activity: 23-37 mins                     Verner Mould, DPT Acute Rehabilitation Services Office [phone]  11/19/22 1:23 PM

## 2022-11-19 NOTE — Progress Notes (Signed)
TOC anticipating pt to d/c to SNF on Friday, 12/29.

## 2022-11-20 DIAGNOSIS — R600 Localized edema: Secondary | ICD-10-CM | POA: Diagnosis not present

## 2022-11-20 MED ORDER — CALCIUM CARBONATE ANTACID 500 MG PO CHEW
1.0000 | CHEWABLE_TABLET | Freq: Once | ORAL | Status: AC
  Administered 2022-11-20: 200 mg via ORAL
  Filled 2022-11-20: qty 1

## 2022-11-20 NOTE — ED Provider Notes (Signed)
  Physical Exam  BP (!) 122/56 (BP Location: Left Arm)   Pulse 76   Temp 99 F (37.2 C) (Oral)   Resp 18   LMP  (LMP Unknown)   SpO2 97%   Physical Exam  Procedures  Procedures  ED Course / MDM   Clinical Course as of 11/20/22 0942  Sun Nov 09, 2022  0445 Patient unable to ambulate even with assistance due to leg pain/weakness. Will require PT eval and TOC evaluation for boarding.  [RS]    Clinical Course User Index [RS] Sponseller, Gypsy Balsam, PA-C   Medical Decision Making Amount and/or Complexity of Data Reviewed Labs: ordered.  Risk Prescription drug management.   Pending placement to skilled nursing.  Potentially discharge tomorrow per note.       Davonna Belling, MD 11/20/22 [phone]

## 2022-11-20 NOTE — ED Notes (Signed)
Patient's purewick canister emptied at this time.  Room straightened per request.  Provided with fresh ice water.  No complaints voiced at this time.

## 2022-11-20 NOTE — Progress Notes (Addendum)
Auth pending.    Addend @ 9:30 AM Provided son an update.   Addend @ 10:43 AM Auth approved. Pt to d/c to [facility] on 12/29.

## 2022-11-20 NOTE — ED Notes (Signed)
Patient repositioned in bed.  Pure Wick checked and remains in good positioning

## 2022-11-21 DIAGNOSIS — I480 Paroxysmal atrial fibrillation: Secondary | ICD-10-CM | POA: Diagnosis not present

## 2022-11-21 DIAGNOSIS — M79662 Pain in left lower leg: Secondary | ICD-10-CM | POA: Diagnosis not present

## 2022-11-21 DIAGNOSIS — R39198 Other difficulties with micturition: Secondary | ICD-10-CM | POA: Diagnosis not present

## 2022-11-21 DIAGNOSIS — Z7901 Long term (current) use of anticoagulants: Secondary | ICD-10-CM | POA: Diagnosis not present

## 2022-11-21 DIAGNOSIS — E1169 Type 2 diabetes mellitus with other specified complication: Secondary | ICD-10-CM | POA: Diagnosis not present

## 2022-11-21 DIAGNOSIS — M6281 Muscle weakness (generalized): Secondary | ICD-10-CM | POA: Diagnosis not present

## 2022-11-21 DIAGNOSIS — M79604 Pain in right leg: Secondary | ICD-10-CM | POA: Diagnosis not present

## 2022-11-21 DIAGNOSIS — I890 Lymphedema, not elsewhere classified: Secondary | ICD-10-CM | POA: Diagnosis not present

## 2022-11-21 DIAGNOSIS — R293 Abnormal posture: Secondary | ICD-10-CM | POA: Diagnosis not present

## 2022-11-21 DIAGNOSIS — U071 COVID-19: Secondary | ICD-10-CM | POA: Diagnosis not present

## 2022-11-21 DIAGNOSIS — I2699 Other pulmonary embolism without acute cor pulmonale: Secondary | ICD-10-CM | POA: Diagnosis not present

## 2022-11-21 DIAGNOSIS — I4821 Permanent atrial fibrillation: Secondary | ICD-10-CM | POA: Diagnosis not present

## 2022-11-21 DIAGNOSIS — M79605 Pain in left leg: Secondary | ICD-10-CM | POA: Diagnosis not present

## 2022-11-21 DIAGNOSIS — I10 Essential (primary) hypertension: Secondary | ICD-10-CM | POA: Diagnosis not present

## 2022-11-21 DIAGNOSIS — R278 Other lack of coordination: Secondary | ICD-10-CM | POA: Diagnosis not present

## 2022-11-21 DIAGNOSIS — K5900 Constipation, unspecified: Secondary | ICD-10-CM | POA: Diagnosis not present

## 2022-11-21 DIAGNOSIS — M150 Primary generalized (osteo)arthritis: Secondary | ICD-10-CM | POA: Diagnosis not present

## 2022-11-21 DIAGNOSIS — I482 Chronic atrial fibrillation, unspecified: Secondary | ICD-10-CM | POA: Diagnosis not present

## 2022-11-21 DIAGNOSIS — M19011 Primary osteoarthritis, right shoulder: Secondary | ICD-10-CM | POA: Diagnosis not present

## 2022-11-21 DIAGNOSIS — R110 Nausea: Secondary | ICD-10-CM | POA: Diagnosis not present

## 2022-11-21 DIAGNOSIS — K819 Cholecystitis, unspecified: Secondary | ICD-10-CM | POA: Diagnosis not present

## 2022-11-21 DIAGNOSIS — Z743 Need for continuous supervision: Secondary | ICD-10-CM | POA: Diagnosis not present

## 2022-11-21 DIAGNOSIS — R531 Weakness: Secondary | ICD-10-CM | POA: Diagnosis not present

## 2022-11-21 DIAGNOSIS — M25561 Pain in right knee: Secondary | ICD-10-CM | POA: Diagnosis not present

## 2022-11-21 DIAGNOSIS — L89152 Pressure ulcer of sacral region, stage 2: Secondary | ICD-10-CM | POA: Diagnosis not present

## 2022-11-21 DIAGNOSIS — G8929 Other chronic pain: Secondary | ICD-10-CM | POA: Diagnosis not present

## 2022-11-21 DIAGNOSIS — K219 Gastro-esophageal reflux disease without esophagitis: Secondary | ICD-10-CM | POA: Diagnosis not present

## 2022-11-21 DIAGNOSIS — E876 Hypokalemia: Secondary | ICD-10-CM | POA: Diagnosis not present

## 2022-11-21 DIAGNOSIS — G894 Chronic pain syndrome: Secondary | ICD-10-CM | POA: Diagnosis not present

## 2022-11-21 DIAGNOSIS — R262 Difficulty in walking, not elsewhere classified: Secondary | ICD-10-CM | POA: Diagnosis not present

## 2022-11-21 DIAGNOSIS — M79661 Pain in right lower leg: Secondary | ICD-10-CM | POA: Diagnosis not present

## 2022-11-21 DIAGNOSIS — R600 Localized edema: Secondary | ICD-10-CM | POA: Diagnosis not present

## 2022-11-21 DIAGNOSIS — D638 Anemia in other chronic diseases classified elsewhere: Secondary | ICD-10-CM | POA: Diagnosis not present

## 2022-11-21 DIAGNOSIS — R2681 Unsteadiness on feet: Secondary | ICD-10-CM | POA: Diagnosis not present

## 2022-11-21 DIAGNOSIS — C8330 Diffuse large B-cell lymphoma, unspecified site: Secondary | ICD-10-CM | POA: Diagnosis not present

## 2022-11-21 DIAGNOSIS — C8510 Unspecified B-cell lymphoma, unspecified site: Secondary | ICD-10-CM | POA: Diagnosis not present

## 2022-11-21 DIAGNOSIS — E782 Mixed hyperlipidemia: Secondary | ICD-10-CM | POA: Diagnosis not present

## 2022-11-21 NOTE — ED Notes (Signed)
Contacted [facility] to give report however RN was unable to come to the phone. Name and number to be reached left w/ receptionist. Also informed receptionist that [facility] had been called to transport pt to SNF

## 2022-11-21 NOTE — ED Notes (Signed)
Patient reports that temperature in "room has been change and something is not right with that.  Someone or something must have changed it."  Informed patient that temperature was current set at 76 degrees and could not be changed at this time.  Patient continues to express that something is wrong.  Offered additional blankets and patient declined.

## 2022-11-21 NOTE — ED Provider Notes (Signed)
Emergency Medicine Observation Re-evaluation Note  Lauren Santana is a 71 y.o. female, seen on rounds today.  Pt initially presented to the ED for complaints of Leg Swelling Currently, the patient is without concerns. She had breakfast, looking forward to being discharged to [facility].  Physical Exam  BP (!) 121/55 (BP Location: Left Arm)   Pulse 75   Temp 98.8 F (37.1 C) (Oral)   Resp 18   LMP  (LMP Unknown)   SpO2 93%  Physical Exam General: NAD  Cardiac: RRR no murmurs Lungs: CTAB Psych: normal affect  ED Course / MDM  EKG:   I have reviewed the labs performed to date as well as medications administered while in observation.  Recent changes in the last 24 hours include none.  Plan  Current plan is discharge back to [facility] today.   Gareth Morgan, MD 11/21/22 [phone]

## 2022-11-21 NOTE — Progress Notes (Addendum)
Pt will be d/c to SNF today. EDP and RN notified via secure chat. RN to call report. [facility] to transport.   Addend @ 9:13 AM AVS faxed to Janie at [facility]. [facility] called for transport. Son is aware and in agreement with d/c plan.TOC signing off.

## 2022-11-21 NOTE — ED Notes (Signed)
AM medication held at this time to allow patient to rest.  Oncoming RN aware and will administer once patient is awake

## 2022-11-21 NOTE — ED Notes (Signed)
Patient reports that new water and water cup does not "taste right."  States "there is something wrong with it."  Water container replaced and fresh water provided.  Pt reports improved taste at this time

## 2022-11-22 DIAGNOSIS — E1169 Type 2 diabetes mellitus with other specified complication: Secondary | ICD-10-CM | POA: Diagnosis not present

## 2022-11-22 DIAGNOSIS — I10 Essential (primary) hypertension: Secondary | ICD-10-CM | POA: Diagnosis not present

## 2022-11-26 DIAGNOSIS — K819 Cholecystitis, unspecified: Secondary | ICD-10-CM | POA: Diagnosis not present

## 2022-11-26 DIAGNOSIS — C8330 Diffuse large B-cell lymphoma, unspecified site: Secondary | ICD-10-CM | POA: Diagnosis not present

## 2022-11-26 DIAGNOSIS — R110 Nausea: Secondary | ICD-10-CM | POA: Diagnosis not present

## 2022-11-26 DIAGNOSIS — I10 Essential (primary) hypertension: Secondary | ICD-10-CM | POA: Diagnosis not present

## 2022-11-26 DIAGNOSIS — M150 Primary generalized (osteo)arthritis: Secondary | ICD-10-CM | POA: Diagnosis not present

## 2022-11-26 DIAGNOSIS — K5900 Constipation, unspecified: Secondary | ICD-10-CM | POA: Diagnosis not present

## 2022-11-26 DIAGNOSIS — I482 Chronic atrial fibrillation, unspecified: Secondary | ICD-10-CM | POA: Diagnosis not present

## 2022-11-26 DIAGNOSIS — K219 Gastro-esophageal reflux disease without esophagitis: Secondary | ICD-10-CM | POA: Diagnosis not present

## 2022-11-26 DIAGNOSIS — D638 Anemia in other chronic diseases classified elsewhere: Secondary | ICD-10-CM | POA: Diagnosis not present

## 2022-11-26 DIAGNOSIS — E782 Mixed hyperlipidemia: Secondary | ICD-10-CM | POA: Diagnosis not present

## 2022-11-26 DIAGNOSIS — I2699 Other pulmonary embolism without acute cor pulmonale: Secondary | ICD-10-CM | POA: Diagnosis not present

## 2022-11-26 DIAGNOSIS — G894 Chronic pain syndrome: Secondary | ICD-10-CM | POA: Diagnosis not present

## 2022-12-01 DIAGNOSIS — I4821 Permanent atrial fibrillation: Secondary | ICD-10-CM | POA: Diagnosis not present

## 2022-12-01 DIAGNOSIS — I10 Essential (primary) hypertension: Secondary | ICD-10-CM | POA: Diagnosis not present

## 2022-12-01 DIAGNOSIS — G894 Chronic pain syndrome: Secondary | ICD-10-CM | POA: Diagnosis not present

## 2022-12-01 DIAGNOSIS — R600 Localized edema: Secondary | ICD-10-CM | POA: Diagnosis not present

## 2022-12-01 DIAGNOSIS — M150 Primary generalized (osteo)arthritis: Secondary | ICD-10-CM | POA: Diagnosis not present

## 2022-12-11 ENCOUNTER — Other Ambulatory Visit: Payer: Self-pay | Admitting: *Deleted

## 2022-12-11 DIAGNOSIS — R600 Localized edema: Secondary | ICD-10-CM | POA: Diagnosis not present

## 2022-12-11 DIAGNOSIS — I10 Essential (primary) hypertension: Secondary | ICD-10-CM | POA: Diagnosis not present

## 2022-12-11 DIAGNOSIS — M25561 Pain in right knee: Secondary | ICD-10-CM | POA: Diagnosis not present

## 2022-12-11 DIAGNOSIS — M150 Primary generalized (osteo)arthritis: Secondary | ICD-10-CM | POA: Diagnosis not present

## 2022-12-11 DIAGNOSIS — I4821 Permanent atrial fibrillation: Secondary | ICD-10-CM | POA: Diagnosis not present

## 2022-12-11 DIAGNOSIS — G894 Chronic pain syndrome: Secondary | ICD-10-CM | POA: Diagnosis not present

## 2022-12-11 NOTE — Patient Outreach (Signed)
Lauren Santana resides in [facility] skilled nursing facility. Following for potential care coordination services as benefit of insurance plan and Primary Care Provider.   Spoke with Florentina Jenny, Scientist, forensic. [name] transition plan is to return home.  Will continue to follow.   Marthenia Rolling, MSN, RN,BSN [facility] Coordinator [phone] (Direct dial)

## 2022-12-18 DIAGNOSIS — R600 Localized edema: Secondary | ICD-10-CM | POA: Diagnosis not present

## 2022-12-18 DIAGNOSIS — I10 Essential (primary) hypertension: Secondary | ICD-10-CM | POA: Diagnosis not present

## 2022-12-18 DIAGNOSIS — G894 Chronic pain syndrome: Secondary | ICD-10-CM | POA: Diagnosis not present

## 2022-12-18 DIAGNOSIS — M150 Primary generalized (osteo)arthritis: Secondary | ICD-10-CM | POA: Diagnosis not present

## 2022-12-18 DIAGNOSIS — I4821 Permanent atrial fibrillation: Secondary | ICD-10-CM | POA: Diagnosis not present

## 2022-12-26 DIAGNOSIS — I10 Essential (primary) hypertension: Secondary | ICD-10-CM | POA: Diagnosis not present

## 2022-12-26 DIAGNOSIS — G894 Chronic pain syndrome: Secondary | ICD-10-CM | POA: Diagnosis not present

## 2022-12-26 DIAGNOSIS — M25561 Pain in right knee: Secondary | ICD-10-CM | POA: Diagnosis not present

## 2022-12-26 DIAGNOSIS — M150 Primary generalized (osteo)arthritis: Secondary | ICD-10-CM | POA: Diagnosis not present

## 2022-12-26 DIAGNOSIS — R600 Localized edema: Secondary | ICD-10-CM | POA: Diagnosis not present

## 2022-12-26 DIAGNOSIS — I4821 Permanent atrial fibrillation: Secondary | ICD-10-CM | POA: Diagnosis not present

## 2022-12-29 DIAGNOSIS — L89152 Pressure ulcer of sacral region, stage 2: Secondary | ICD-10-CM | POA: Diagnosis not present

## 2023-01-01 DIAGNOSIS — I4821 Permanent atrial fibrillation: Secondary | ICD-10-CM | POA: Diagnosis not present

## 2023-01-01 DIAGNOSIS — G894 Chronic pain syndrome: Secondary | ICD-10-CM | POA: Diagnosis not present

## 2023-01-01 DIAGNOSIS — I10 Essential (primary) hypertension: Secondary | ICD-10-CM | POA: Diagnosis not present

## 2023-01-01 DIAGNOSIS — M150 Primary generalized (osteo)arthritis: Secondary | ICD-10-CM | POA: Diagnosis not present

## 2023-01-05 DIAGNOSIS — G894 Chronic pain syndrome: Secondary | ICD-10-CM | POA: Diagnosis not present

## 2023-01-05 DIAGNOSIS — M150 Primary generalized (osteo)arthritis: Secondary | ICD-10-CM | POA: Diagnosis not present

## 2023-01-05 DIAGNOSIS — R600 Localized edema: Secondary | ICD-10-CM | POA: Diagnosis not present

## 2023-01-05 DIAGNOSIS — I10 Essential (primary) hypertension: Secondary | ICD-10-CM | POA: Diagnosis not present

## 2023-01-05 DIAGNOSIS — I482 Chronic atrial fibrillation, unspecified: Secondary | ICD-10-CM | POA: Diagnosis not present

## 2023-01-09 DIAGNOSIS — R531 Weakness: Secondary | ICD-10-CM | POA: Diagnosis not present

## 2023-01-09 DIAGNOSIS — C8510 Unspecified B-cell lymphoma, unspecified site: Secondary | ICD-10-CM | POA: Diagnosis not present

## 2023-01-09 DIAGNOSIS — I480 Paroxysmal atrial fibrillation: Secondary | ICD-10-CM | POA: Diagnosis not present

## 2023-01-09 DIAGNOSIS — I2699 Other pulmonary embolism without acute cor pulmonale: Secondary | ICD-10-CM | POA: Diagnosis not present

## 2023-01-16 DIAGNOSIS — I482 Chronic atrial fibrillation, unspecified: Secondary | ICD-10-CM | POA: Diagnosis not present

## 2023-01-16 DIAGNOSIS — K5900 Constipation, unspecified: Secondary | ICD-10-CM | POA: Diagnosis not present

## 2023-01-16 DIAGNOSIS — I10 Essential (primary) hypertension: Secondary | ICD-10-CM | POA: Diagnosis not present

## 2023-01-16 DIAGNOSIS — M25561 Pain in right knee: Secondary | ICD-10-CM | POA: Diagnosis not present

## 2023-01-18 DIAGNOSIS — I2699 Other pulmonary embolism without acute cor pulmonale: Secondary | ICD-10-CM | POA: Diagnosis not present

## 2023-01-18 DIAGNOSIS — K5900 Constipation, unspecified: Secondary | ICD-10-CM | POA: Diagnosis not present

## 2023-01-18 DIAGNOSIS — M150 Primary generalized (osteo)arthritis: Secondary | ICD-10-CM | POA: Diagnosis not present

## 2023-01-18 DIAGNOSIS — C8330 Diffuse large B-cell lymphoma, unspecified site: Secondary | ICD-10-CM | POA: Diagnosis not present

## 2023-01-18 DIAGNOSIS — I10 Essential (primary) hypertension: Secondary | ICD-10-CM | POA: Diagnosis not present

## 2023-01-18 DIAGNOSIS — I482 Chronic atrial fibrillation, unspecified: Secondary | ICD-10-CM | POA: Diagnosis not present

## 2023-01-18 DIAGNOSIS — K219 Gastro-esophageal reflux disease without esophagitis: Secondary | ICD-10-CM | POA: Diagnosis not present

## 2023-01-18 DIAGNOSIS — G894 Chronic pain syndrome: Secondary | ICD-10-CM | POA: Diagnosis not present

## 2023-01-21 ENCOUNTER — Ambulatory Visit: Payer: Medicare Other | Admitting: Physician Assistant

## 2023-01-23 DIAGNOSIS — R39198 Other difficulties with micturition: Secondary | ICD-10-CM | POA: Diagnosis not present

## 2023-01-24 DIAGNOSIS — I482 Chronic atrial fibrillation, unspecified: Secondary | ICD-10-CM | POA: Diagnosis not present

## 2023-01-24 DIAGNOSIS — C8330 Diffuse large B-cell lymphoma, unspecified site: Secondary | ICD-10-CM | POA: Diagnosis not present

## 2023-01-24 DIAGNOSIS — K5900 Constipation, unspecified: Secondary | ICD-10-CM | POA: Diagnosis not present

## 2023-01-24 DIAGNOSIS — I10 Essential (primary) hypertension: Secondary | ICD-10-CM | POA: Diagnosis not present

## 2023-01-24 DIAGNOSIS — K219 Gastro-esophageal reflux disease without esophagitis: Secondary | ICD-10-CM | POA: Diagnosis not present

## 2023-01-24 DIAGNOSIS — I2699 Other pulmonary embolism without acute cor pulmonale: Secondary | ICD-10-CM | POA: Diagnosis not present

## 2023-01-24 DIAGNOSIS — G894 Chronic pain syndrome: Secondary | ICD-10-CM | POA: Diagnosis not present

## 2023-01-24 DIAGNOSIS — M150 Primary generalized (osteo)arthritis: Secondary | ICD-10-CM | POA: Diagnosis not present

## 2023-01-26 ENCOUNTER — Ambulatory Visit (INDEPENDENT_AMBULATORY_CARE_PROVIDER_SITE_OTHER): Payer: 59 | Admitting: Physician Assistant

## 2023-01-26 ENCOUNTER — Ambulatory Visit: Payer: Self-pay

## 2023-01-26 ENCOUNTER — Encounter: Payer: Self-pay | Admitting: Physician Assistant

## 2023-01-26 DIAGNOSIS — M25561 Pain in right knee: Secondary | ICD-10-CM | POA: Diagnosis not present

## 2023-01-26 DIAGNOSIS — G8929 Other chronic pain: Secondary | ICD-10-CM | POA: Diagnosis not present

## 2023-01-26 MED ORDER — METHYLPREDNISOLONE ACETATE 40 MG/ML IJ SUSP
40.0000 mg | INTRAMUSCULAR | Status: AC | PRN
  Administered 2023-01-26: 40 mg via INTRA_ARTICULAR

## 2023-01-26 MED ORDER — BUPIVACAINE HCL 0.25 % IJ SOLN
2.0000 mL | INTRAMUSCULAR | Status: AC | PRN
  Administered 2023-01-26: 2 mL via INTRA_ARTICULAR

## 2023-01-26 MED ORDER — LIDOCAINE HCL 1 % IJ SOLN
2.0000 mL | INTRAMUSCULAR | Status: AC | PRN
  Administered 2023-01-26: 2 mL

## 2023-01-26 NOTE — Progress Notes (Signed)
Office Visit Note   Patient: Lauren Santana           Date of Birth: 02/08/1951           MRN: [id number] Visit Date: 01/26/2023              Requested by: Lucianne Lei, MD [address],  [place] [id number] PCP: Lucianne Lei, MD  Chief Complaint  Patient presents with  . Right Knee - Pain      HPI: Daphney is a pleasant 72 year old woman who is currently in a rehab facility gaining strength in her legs.  She is status post left total knee arthroplasty several years ago and I do not know who the provider is.  She comes in today complaining of right knee pain and giving way.  She says she is having difficulty at rehab because when they try to stand her up she says her knee hurts and gives way  Assessment & Plan: Visit Diagnoses:  1. 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  I offered her an injection today she like to go forward with this.  Once she is more mobile with her left knee and right knee could consider knee replacement surgery could follow-up with one of our knee specialists  Follow-Up Instructions: As needed  Ortho Exam  Patient is alert, oriented, no adenopathy, well-dressed, normal affect, normal respiratory effort. Right knee no effusion no erythema no redness compartments are soft and nontender.  No cellulitis.  She is tender over the medial joint line has almost no patellar mobility and is tender over the patellofemoral joint she is neurovascular intact  Imaging: No results found. No images are attached to the encounter.  Labs: Lab Results  Component Value Date   HGBA1C 4.6 (L) 08/16/2020   HGBA1C 5.1 01/17/2020   ESRSEDRATE 80 (H) 03/14/2019   ESRSEDRATE 21 01/25/2019   ESRSEDRATE 90 (H) 01/03/2019   CRP 40 (H) 03/14/2019   CRP 6 01/03/2019   LABURIC 6.8 02/21/2010   REPTSTATUS 08/20/2020 FINAL 08/17/2020   GRAMSTAIN  08/17/2020    ABUNDANT WBC PRESENT, PREDOMINANTLY PMN RARE  GRAM NEGATIVE RODS Performed at [facility], [place] [address] [place], [place] [address]    CULT RARE KLEBSIELLA PNEUMONIAE 08/17/2020   LABORGA KLEBSIELLA PNEUMONIAE 08/17/2020     Lab Results  Component Value Date   ALBUMIN 2.2 (L) 08/23/2020   ALBUMIN 2.2 (L) 08/21/2020   ALBUMIN 2.1 (L) 08/20/2020    Lab Results  Component Value Date   MG 1.7 08/23/2020   MG 1.8 08/19/2020   MG 1.9 07/25/2020   No results found for: "VD25OH"  No results found for: "PREALBUMIN"    Latest Ref Rng & Units 11/09/2022    1:14 AM 09/14/2022    7:36 AM 09/03/2020    3:01 PM  CBC EXTENDED  WBC 4.0 - 10.5 K/uL 4.5  3.9  5.2   RBC 3.87 - 5.11 MIL/uL 3.76  3.74  3.87   Hemoglobin 12.0 - 15.0 g/dL 12.3  12.0  11.8   HCT 36.0 - 46.0 % 39.2  38.7  36.4   Platelets 150 - 400 K/uL 101  100  194   NEUT# 1.7 - 7.7 K/uL 2.6  1.4    Lymph# 0.7 - 4.0 K/uL 0.9  1.8       There is no height or weight on file to  calculate BMI.  Orders:  Orders Placed This Encounter  Procedures  . XR Knee 1-2 Views Right   No orders of the defined types were placed in this encounter.    Procedures: Large Joint Inj on 01/26/2023 11:26 AM Indications: pain and diagnostic evaluation Details: 25 G 1.5 in needle, anteromedial approach  Arthrogram: No  Medications: 40 mg methylPREDNISolone acetate 40 MG/ML; 2 mL lidocaine 1 %; 2 mL bupivacaine 0.25 % Outcome: tolerated well, no immediate complications Procedure, treatment alternatives, risks and benefits explained, specific risks discussed. Consent was given by the patient.    Clinical Data: No additional findings.  ROS:  All other systems negative, except as noted in the HPI. Review of Systems  All other systems reviewed and are negative.  Objective: Vital Signs: LMP  (LMP Unknown)   Specialty Comments:  No specialty comments available.  PMFS History: Patient Active Problem List   Diagnosis Date Noted  . Anticoagulated 12/11/2020  .  Cholecystitis, acute 08/16/2020  . Abdominal pain 08/16/2020  . Severe sepsis ([place]) 08/16/2020  . Pressure injury of skin 08/16/2020  . Atrial fibrillation ([place]) 07/24/2020  . Thrombocytopenia ([facility]) 07/24/2020  . Anemia of chronic disease 07/24/2020  . Chronic pain 07/24/2020  . 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 Pulmonary embolism ([place]) 07/19/2020  . 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 S/P BSO (bilateral salpingo-oophorectomy) 01/08/2017  . S/P hysterectomy 01/08/2017  . Tachycardia   . Acute deep vein thrombosis (DVT) of right lower extremity ([place]) 02/14/2016  . Pulmonary emboli ([place]) 02/13/2016  . PE (pulmonary embolism) 02/13/2016  . AKI (acute kidney injury) ([facility]) 02/13/2016  . Pulmonary embolism with acute cor pulmonale ([place])   . Pelvic mass in female   . Obesity (BMI 30-39.9) 08/20/2015  . 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 Essential hypertension, benign 07/23/2014  . Edema 07/23/2014  . Dyslipidemia 07/23/2014  . Lower extremity numbness 07/23/2014  . 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 Postmenopausal bleeding 03/10/2012  . Endometriosis of pelvis 01/22/2007   Past Medical History:  Diagnosis Date  . Arthritis   . Edema    B/LLE  . Endometriosis   . High cholesterol   . Hypertension   . Numbness    feet  . Pre-diabetes   . Pulmonary embolism ([facility])    recurrent, second episode in 06/2020  . Walker as ambulation aid   . Wears dentures   . Wears glasses     Family History  Problem Relation Age of Onset  . Colon cancer Sister   . Cancer Sister        unsure of type of cancer    Past Surgical History:  Procedure Laterality Date  . ABDOMINAL HYSTERECTOMY     and BSO  . CATARACT EXTRACTION Left 10/2018  . [place]  . COLONOSCOPY    . EYE SURGERY Bilateral    CATARACT SX  . HERNIA REPAIR  2001   incisional  . IR CATHETER TUBE CHANGE  10/12/2020  . IR CATHETER TUBE CHANGE  12/07/2020   . IR CHOLANGIOGRAM EXISTING TUBE  09/28/2020  . IR PERC CHOLECYSTOSTOMY  08/17/2020  . IR RADIOLOGIST EVAL & MGMT  11/22/2020  . JOINT REPLACEMENT Left    Knee  . MAXILLARY ANTROSTOMY Right 01/20/2020   Procedure: MAXILLARY ANTROSTOMY  WITH BIOPSY CALDWELL APPROACH;  Surgeon: Jerrell Belfast, MD;  Location: [facility];  Service: ENT;  Laterality: Right;  . MULTIPLE TOOTH  EXTRACTIONS  2011  . NASAL ENDOSCOPY Right 02/17/2020   Procedure: NASAL ENDOSCOPY WITH RIGHT INFERIOR TURNBINATE REDUCTION;  Surgeon: Jerrell Belfast, MD;  Location: [facility];  Service: ENT;  Laterality: Right;  . neck mass removal     "fatty tissue, not cancerous" per pt's son  . SINUS ENDO WITH FUSION Right 01/20/2020   Procedure: SINUS ENDO WITH FUSION WITH BIOSPY;  Surgeon: Jerrell Belfast, MD;  Location: [facility];  Service: ENT;  Laterality: Right;  . TOTAL KNEE ARTHROPLASTY Left 07/17/2014   Procedure: TOTAL KNEE ARTHROPLASTY;  Surgeon: Kerin Salen, MD;  Location: [facility];  Service: Orthopedics;  Laterality: Left;   Social History   Occupational History  . Not on file  Tobacco Use  . Smoking status: Never  . Smokeless tobacco: Never  Vaping Use  . Vaping Use: Never used  Substance and Sexual Activity  . Alcohol use: No  . Drug use: No  . Sexual activity: Not Currently    Comment: Hysterectoomy

## 2023-01-27 DIAGNOSIS — K219 Gastro-esophageal reflux disease without esophagitis: Secondary | ICD-10-CM | POA: Diagnosis not present

## 2023-01-27 DIAGNOSIS — R600 Localized edema: Secondary | ICD-10-CM | POA: Diagnosis not present

## 2023-01-27 DIAGNOSIS — I10 Essential (primary) hypertension: Secondary | ICD-10-CM | POA: Diagnosis not present

## 2023-01-27 DIAGNOSIS — G894 Chronic pain syndrome: Secondary | ICD-10-CM | POA: Diagnosis not present

## 2023-01-27 DIAGNOSIS — I482 Chronic atrial fibrillation, unspecified: Secondary | ICD-10-CM | POA: Diagnosis not present

## 2023-01-27 DIAGNOSIS — L89152 Pressure ulcer of sacral region, stage 2: Secondary | ICD-10-CM | POA: Diagnosis not present

## 2023-01-27 DIAGNOSIS — M25561 Pain in right knee: Secondary | ICD-10-CM | POA: Diagnosis not present

## 2023-02-05 DIAGNOSIS — M19012 Primary osteoarthritis, left shoulder: Secondary | ICD-10-CM | POA: Diagnosis not present

## 2023-02-05 DIAGNOSIS — M25512 Pain in left shoulder: Secondary | ICD-10-CM | POA: Diagnosis not present

## 2023-02-05 DIAGNOSIS — M150 Primary generalized (osteo)arthritis: Secondary | ICD-10-CM | POA: Diagnosis not present

## 2023-02-05 DIAGNOSIS — I10 Essential (primary) hypertension: Secondary | ICD-10-CM | POA: Diagnosis not present

## 2023-02-05 DIAGNOSIS — I482 Chronic atrial fibrillation, unspecified: Secondary | ICD-10-CM | POA: Diagnosis not present

## 2023-02-05 DIAGNOSIS — G894 Chronic pain syndrome: Secondary | ICD-10-CM | POA: Diagnosis not present

## 2023-02-07 DIAGNOSIS — I2699 Other pulmonary embolism without acute cor pulmonale: Secondary | ICD-10-CM | POA: Diagnosis not present

## 2023-02-07 DIAGNOSIS — I482 Chronic atrial fibrillation, unspecified: Secondary | ICD-10-CM | POA: Diagnosis not present

## 2023-02-07 DIAGNOSIS — G894 Chronic pain syndrome: Secondary | ICD-10-CM | POA: Diagnosis not present

## 2023-02-07 DIAGNOSIS — K5900 Constipation, unspecified: Secondary | ICD-10-CM | POA: Diagnosis not present

## 2023-02-07 DIAGNOSIS — I10 Essential (primary) hypertension: Secondary | ICD-10-CM | POA: Diagnosis not present

## 2023-02-07 DIAGNOSIS — M150 Primary generalized (osteo)arthritis: Secondary | ICD-10-CM | POA: Diagnosis not present

## 2023-02-07 DIAGNOSIS — K219 Gastro-esophageal reflux disease without esophagitis: Secondary | ICD-10-CM | POA: Diagnosis not present

## 2023-02-09 DIAGNOSIS — R262 Difficulty in walking, not elsewhere classified: Secondary | ICD-10-CM | POA: Diagnosis not present

## 2023-02-09 DIAGNOSIS — R278 Other lack of coordination: Secondary | ICD-10-CM | POA: Diagnosis not present

## 2023-02-09 DIAGNOSIS — M19011 Primary osteoarthritis, right shoulder: Secondary | ICD-10-CM | POA: Diagnosis not present

## 2023-02-09 DIAGNOSIS — M6281 Muscle weakness (generalized): Secondary | ICD-10-CM | POA: Diagnosis not present

## 2023-02-09 DIAGNOSIS — I890 Lymphedema, not elsewhere classified: Secondary | ICD-10-CM | POA: Diagnosis not present

## 2023-02-09 DIAGNOSIS — R2681 Unsteadiness on feet: Secondary | ICD-10-CM | POA: Diagnosis not present

## 2023-02-09 DIAGNOSIS — M79605 Pain in left leg: Secondary | ICD-10-CM | POA: Diagnosis not present

## 2023-02-09 DIAGNOSIS — R293 Abnormal posture: Secondary | ICD-10-CM | POA: Diagnosis not present

## 2023-02-09 DIAGNOSIS — M79604 Pain in right leg: Secondary | ICD-10-CM | POA: Diagnosis not present

## 2023-02-10 DIAGNOSIS — M19011 Primary osteoarthritis, right shoulder: Secondary | ICD-10-CM | POA: Diagnosis not present

## 2023-02-10 DIAGNOSIS — I890 Lymphedema, not elsewhere classified: Secondary | ICD-10-CM | POA: Diagnosis not present

## 2023-02-10 DIAGNOSIS — R278 Other lack of coordination: Secondary | ICD-10-CM | POA: Diagnosis not present

## 2023-02-10 DIAGNOSIS — R2681 Unsteadiness on feet: Secondary | ICD-10-CM | POA: Diagnosis not present

## 2023-02-10 DIAGNOSIS — M79604 Pain in right leg: Secondary | ICD-10-CM | POA: Diagnosis not present

## 2023-02-10 DIAGNOSIS — M79605 Pain in left leg: Secondary | ICD-10-CM | POA: Diagnosis not present

## 2023-02-10 DIAGNOSIS — R293 Abnormal posture: Secondary | ICD-10-CM | POA: Diagnosis not present

## 2023-02-10 DIAGNOSIS — M6281 Muscle weakness (generalized): Secondary | ICD-10-CM | POA: Diagnosis not present

## 2023-02-10 DIAGNOSIS — R262 Difficulty in walking, not elsewhere classified: Secondary | ICD-10-CM | POA: Diagnosis not present

## 2023-02-11 DIAGNOSIS — M6281 Muscle weakness (generalized): Secondary | ICD-10-CM | POA: Diagnosis not present

## 2023-02-11 DIAGNOSIS — R293 Abnormal posture: Secondary | ICD-10-CM | POA: Diagnosis not present

## 2023-02-11 DIAGNOSIS — M19011 Primary osteoarthritis, right shoulder: Secondary | ICD-10-CM | POA: Diagnosis not present

## 2023-02-11 DIAGNOSIS — R2681 Unsteadiness on feet: Secondary | ICD-10-CM | POA: Diagnosis not present

## 2023-02-11 DIAGNOSIS — M79604 Pain in right leg: Secondary | ICD-10-CM | POA: Diagnosis not present

## 2023-02-11 DIAGNOSIS — R262 Difficulty in walking, not elsewhere classified: Secondary | ICD-10-CM | POA: Diagnosis not present

## 2023-02-11 DIAGNOSIS — M79605 Pain in left leg: Secondary | ICD-10-CM | POA: Diagnosis not present

## 2023-02-11 DIAGNOSIS — I890 Lymphedema, not elsewhere classified: Secondary | ICD-10-CM | POA: Diagnosis not present

## 2023-02-11 DIAGNOSIS — R278 Other lack of coordination: Secondary | ICD-10-CM | POA: Diagnosis not present

## 2023-02-13 DIAGNOSIS — M6281 Muscle weakness (generalized): Secondary | ICD-10-CM | POA: Diagnosis not present

## 2023-02-13 DIAGNOSIS — M79605 Pain in left leg: Secondary | ICD-10-CM | POA: Diagnosis not present

## 2023-02-13 DIAGNOSIS — R293 Abnormal posture: Secondary | ICD-10-CM | POA: Diagnosis not present

## 2023-02-13 DIAGNOSIS — R2681 Unsteadiness on feet: Secondary | ICD-10-CM | POA: Diagnosis not present

## 2023-02-13 DIAGNOSIS — I890 Lymphedema, not elsewhere classified: Secondary | ICD-10-CM | POA: Diagnosis not present

## 2023-02-13 DIAGNOSIS — M79604 Pain in right leg: Secondary | ICD-10-CM | POA: Diagnosis not present

## 2023-02-13 DIAGNOSIS — M19011 Primary osteoarthritis, right shoulder: Secondary | ICD-10-CM | POA: Diagnosis not present

## 2023-02-13 DIAGNOSIS — R262 Difficulty in walking, not elsewhere classified: Secondary | ICD-10-CM | POA: Diagnosis not present

## 2023-02-13 DIAGNOSIS — R278 Other lack of coordination: Secondary | ICD-10-CM | POA: Diagnosis not present

## 2023-02-14 DIAGNOSIS — M79604 Pain in right leg: Secondary | ICD-10-CM | POA: Diagnosis not present

## 2023-02-14 DIAGNOSIS — M6281 Muscle weakness (generalized): Secondary | ICD-10-CM | POA: Diagnosis not present

## 2023-02-14 DIAGNOSIS — R293 Abnormal posture: Secondary | ICD-10-CM | POA: Diagnosis not present

## 2023-02-14 DIAGNOSIS — R2681 Unsteadiness on feet: Secondary | ICD-10-CM | POA: Diagnosis not present

## 2023-02-14 DIAGNOSIS — I890 Lymphedema, not elsewhere classified: Secondary | ICD-10-CM | POA: Diagnosis not present

## 2023-02-14 DIAGNOSIS — M19011 Primary osteoarthritis, right shoulder: Secondary | ICD-10-CM | POA: Diagnosis not present

## 2023-02-14 DIAGNOSIS — R262 Difficulty in walking, not elsewhere classified: Secondary | ICD-10-CM | POA: Diagnosis not present

## 2023-02-14 DIAGNOSIS — R278 Other lack of coordination: Secondary | ICD-10-CM | POA: Diagnosis not present

## 2023-02-14 DIAGNOSIS — M79605 Pain in left leg: Secondary | ICD-10-CM | POA: Diagnosis not present

## 2023-02-16 ENCOUNTER — Ambulatory Visit (INDEPENDENT_AMBULATORY_CARE_PROVIDER_SITE_OTHER): Payer: 59 | Admitting: Podiatry

## 2023-02-16 DIAGNOSIS — G894 Chronic pain syndrome: Secondary | ICD-10-CM | POA: Diagnosis not present

## 2023-02-16 DIAGNOSIS — M79674 Pain in right toe(s): Secondary | ICD-10-CM | POA: Diagnosis not present

## 2023-02-16 DIAGNOSIS — B351 Tinea unguium: Secondary | ICD-10-CM

## 2023-02-16 DIAGNOSIS — R293 Abnormal posture: Secondary | ICD-10-CM | POA: Diagnosis not present

## 2023-02-16 DIAGNOSIS — M79675 Pain in left toe(s): Secondary | ICD-10-CM | POA: Diagnosis not present

## 2023-02-16 DIAGNOSIS — M79605 Pain in left leg: Secondary | ICD-10-CM | POA: Diagnosis not present

## 2023-02-16 DIAGNOSIS — M6281 Muscle weakness (generalized): Secondary | ICD-10-CM | POA: Diagnosis not present

## 2023-02-16 DIAGNOSIS — M19011 Primary osteoarthritis, right shoulder: Secondary | ICD-10-CM | POA: Diagnosis not present

## 2023-02-16 DIAGNOSIS — I482 Chronic atrial fibrillation, unspecified: Secondary | ICD-10-CM | POA: Diagnosis not present

## 2023-02-16 DIAGNOSIS — R2681 Unsteadiness on feet: Secondary | ICD-10-CM | POA: Diagnosis not present

## 2023-02-16 DIAGNOSIS — M25512 Pain in left shoulder: Secondary | ICD-10-CM | POA: Diagnosis not present

## 2023-02-16 DIAGNOSIS — I890 Lymphedema, not elsewhere classified: Secondary | ICD-10-CM | POA: Diagnosis not present

## 2023-02-16 DIAGNOSIS — M150 Primary generalized (osteo)arthritis: Secondary | ICD-10-CM | POA: Diagnosis not present

## 2023-02-16 DIAGNOSIS — I10 Essential (primary) hypertension: Secondary | ICD-10-CM | POA: Diagnosis not present

## 2023-02-16 DIAGNOSIS — R278 Other lack of coordination: Secondary | ICD-10-CM | POA: Diagnosis not present

## 2023-02-16 DIAGNOSIS — R262 Difficulty in walking, not elsewhere classified: Secondary | ICD-10-CM | POA: Diagnosis not present

## 2023-02-16 DIAGNOSIS — M79604 Pain in right leg: Secondary | ICD-10-CM | POA: Diagnosis not present

## 2023-02-17 DIAGNOSIS — M79605 Pain in left leg: Secondary | ICD-10-CM | POA: Diagnosis not present

## 2023-02-17 DIAGNOSIS — I10 Essential (primary) hypertension: Secondary | ICD-10-CM | POA: Diagnosis not present

## 2023-02-17 DIAGNOSIS — M19011 Primary osteoarthritis, right shoulder: Secondary | ICD-10-CM | POA: Diagnosis not present

## 2023-02-17 DIAGNOSIS — M79604 Pain in right leg: Secondary | ICD-10-CM | POA: Diagnosis not present

## 2023-02-17 DIAGNOSIS — R293 Abnormal posture: Secondary | ICD-10-CM | POA: Diagnosis not present

## 2023-02-17 DIAGNOSIS — R262 Difficulty in walking, not elsewhere classified: Secondary | ICD-10-CM | POA: Diagnosis not present

## 2023-02-17 DIAGNOSIS — M6281 Muscle weakness (generalized): Secondary | ICD-10-CM | POA: Diagnosis not present

## 2023-02-17 DIAGNOSIS — R2681 Unsteadiness on feet: Secondary | ICD-10-CM | POA: Diagnosis not present

## 2023-02-17 DIAGNOSIS — I890 Lymphedema, not elsewhere classified: Secondary | ICD-10-CM | POA: Diagnosis not present

## 2023-02-17 DIAGNOSIS — R278 Other lack of coordination: Secondary | ICD-10-CM | POA: Diagnosis not present

## 2023-02-18 DIAGNOSIS — M79604 Pain in right leg: Secondary | ICD-10-CM | POA: Diagnosis not present

## 2023-02-18 DIAGNOSIS — I890 Lymphedema, not elsewhere classified: Secondary | ICD-10-CM | POA: Diagnosis not present

## 2023-02-18 DIAGNOSIS — R278 Other lack of coordination: Secondary | ICD-10-CM | POA: Diagnosis not present

## 2023-02-18 DIAGNOSIS — M19011 Primary osteoarthritis, right shoulder: Secondary | ICD-10-CM | POA: Diagnosis not present

## 2023-02-18 DIAGNOSIS — R2681 Unsteadiness on feet: Secondary | ICD-10-CM | POA: Diagnosis not present

## 2023-02-18 DIAGNOSIS — R293 Abnormal posture: Secondary | ICD-10-CM | POA: Diagnosis not present

## 2023-02-18 DIAGNOSIS — M6281 Muscle weakness (generalized): Secondary | ICD-10-CM | POA: Diagnosis not present

## 2023-02-18 DIAGNOSIS — R262 Difficulty in walking, not elsewhere classified: Secondary | ICD-10-CM | POA: Diagnosis not present

## 2023-02-18 DIAGNOSIS — M79605 Pain in left leg: Secondary | ICD-10-CM | POA: Diagnosis not present

## 2023-02-18 NOTE — Progress Notes (Signed)
Subjective: No chief complaint on file.  72 y.o. returns the office today for painful, elongated, thickened toenails which she cannot trim herself. Denies any redness or drainage around the nails.  She states that she still has some neuropathy primary concern is getting her nails trimmed today.  PCP: Lucianne Lei, MD  Objective: AAO 3, NAD-presents in wheelchair DP/PT pulses palpable, CRT less than 3 seconds Chronic bilateral lower extremity edema present. Nails hypertrophic, dystrophic, elongated, brittle, discolored 10. There is tenderness overlying the nails 1-5 bilaterally. There is no surrounding erythema or drainage along the nail sites.  Chronic incurvation present of the toenails without signs of infection. No areas of tenderness identified otherwise. No open lesions or pre-ulcerative lesions are identified. No pain with calf compression, swelling, warmth, erythema.  Assessment: Patient presents with symptomatic onychomycosis; chronic edema; bilateral foot pain, neuropathy  Plan: -Treatment options including alternatives, risks, complications were discussed -Nails sharply debrided 10 without complication/bleeding. -Discussed daily foot inspection. If there are any changes, to call the office immediately.  -Follow-up in 3 months or sooner if any problems are to arise. In the meantime, encouraged to call the office with any questions, concerns, changes symptoms.  Celesta Gentile, DPM

## 2023-02-19 ENCOUNTER — Other Ambulatory Visit: Payer: Self-pay

## 2023-02-19 ENCOUNTER — Encounter (HOSPITAL_COMMUNITY): Payer: Self-pay

## 2023-02-19 ENCOUNTER — Emergency Department (HOSPITAL_COMMUNITY): Payer: 59

## 2023-02-19 ENCOUNTER — Emergency Department (HOSPITAL_COMMUNITY)
Admission: EM | Admit: 2023-02-19 | Discharge: 2023-02-19 | Disposition: A | Discharge status: Skilled Nursing Facility | Payer: 59 | Attending: Emergency Medicine | Admitting: Emergency Medicine

## 2023-02-19 DIAGNOSIS — I10 Essential (primary) hypertension: Secondary | ICD-10-CM | POA: Insufficient documentation

## 2023-02-19 DIAGNOSIS — Z7901 Long term (current) use of anticoagulants: Secondary | ICD-10-CM | POA: Diagnosis not present

## 2023-02-19 DIAGNOSIS — M25521 Pain in right elbow: Secondary | ICD-10-CM | POA: Diagnosis not present

## 2023-02-19 DIAGNOSIS — Z79899 Other long term (current) drug therapy: Secondary | ICD-10-CM | POA: Insufficient documentation

## 2023-02-19 DIAGNOSIS — M79602 Pain in left arm: Secondary | ICD-10-CM | POA: Diagnosis not present

## 2023-02-19 DIAGNOSIS — M19041 Primary osteoarthritis, right hand: Secondary | ICD-10-CM | POA: Diagnosis not present

## 2023-02-19 DIAGNOSIS — M25512 Pain in left shoulder: Secondary | ICD-10-CM | POA: Diagnosis not present

## 2023-02-19 DIAGNOSIS — R0789 Other chest pain: Secondary | ICD-10-CM | POA: Insufficient documentation

## 2023-02-19 DIAGNOSIS — M542 Cervicalgia: Secondary | ICD-10-CM | POA: Diagnosis not present

## 2023-02-19 DIAGNOSIS — T07XXXA Unspecified multiple injuries, initial encounter: Secondary | ICD-10-CM | POA: Diagnosis not present

## 2023-02-19 DIAGNOSIS — R519 Headache, unspecified: Secondary | ICD-10-CM | POA: Diagnosis not present

## 2023-02-19 DIAGNOSIS — E1140 Type 2 diabetes mellitus with diabetic neuropathy, unspecified: Secondary | ICD-10-CM | POA: Diagnosis not present

## 2023-02-19 DIAGNOSIS — S0990XA Unspecified injury of head, initial encounter: Secondary | ICD-10-CM | POA: Diagnosis not present

## 2023-02-19 DIAGNOSIS — S078XXA Crushing injury of other parts of head, initial encounter: Secondary | ICD-10-CM | POA: Diagnosis not present

## 2023-02-19 DIAGNOSIS — M19019 Primary osteoarthritis, unspecified shoulder: Secondary | ICD-10-CM

## 2023-02-19 DIAGNOSIS — Z743 Need for continuous supervision: Secondary | ICD-10-CM | POA: Diagnosis not present

## 2023-02-19 DIAGNOSIS — S199XXA Unspecified injury of neck, initial encounter: Secondary | ICD-10-CM | POA: Diagnosis not present

## 2023-02-19 DIAGNOSIS — M19012 Primary osteoarthritis, left shoulder: Secondary | ICD-10-CM | POA: Insufficient documentation

## 2023-02-19 DIAGNOSIS — R6889 Other general symptoms and signs: Secondary | ICD-10-CM | POA: Diagnosis not present

## 2023-02-19 DIAGNOSIS — M79641 Pain in right hand: Secondary | ICD-10-CM | POA: Diagnosis not present

## 2023-02-19 DIAGNOSIS — W06XXXA Fall from bed, initial encounter: Secondary | ICD-10-CM | POA: Diagnosis not present

## 2023-02-19 DIAGNOSIS — Z7401 Bed confinement status: Secondary | ICD-10-CM | POA: Diagnosis not present

## 2023-02-19 DIAGNOSIS — M79643 Pain in unspecified hand: Secondary | ICD-10-CM | POA: Diagnosis not present

## 2023-02-19 DIAGNOSIS — R609 Edema, unspecified: Secondary | ICD-10-CM | POA: Diagnosis not present

## 2023-02-19 DIAGNOSIS — M19011 Primary osteoarthritis, right shoulder: Secondary | ICD-10-CM | POA: Diagnosis not present

## 2023-02-19 DIAGNOSIS — Z043 Encounter for examination and observation following other accident: Secondary | ICD-10-CM | POA: Diagnosis not present

## 2023-02-19 DIAGNOSIS — W19XXXA Unspecified fall, initial encounter: Secondary | ICD-10-CM

## 2023-02-19 MED ORDER — OXYCODONE HCL 5 MG PO TABS
5.0000 mg | ORAL_TABLET | Freq: Once | ORAL | Status: AC
  Administered 2023-02-19: 5 mg via ORAL
  Filled 2023-02-19: qty 1

## 2023-02-19 NOTE — ED Notes (Signed)
ETA 2 hours

## 2023-02-19 NOTE — ED Provider Notes (Signed)
[facility] Provider Note   CSN: [id number] Arrival date & time: 02/19/23  0840     History  Chief Complaint  Patient presents with   Lytle Michaels    Lauren Santana is a 72 y.o. female.   Fall     Patient has a history of hypertension hypercholesterolemia, diabetes, pulmonary embolism, neuropathy.  Patient also has history of chronic lymphedema and chronic leg pain.  Patient states she resides at a nursing facility.  She was requiring some assistance to roll out of bed.  Asked her to hold onto a railing as they were pushing her onto her side.  Patient states she was not able to hold onto the railing ended up falling out of the bed.  Patient did strike her head against a soft chair but she is on anticoagulation so she was activated as a level 2 trauma.  Patient states she is having pain in her head and neck.  She also has pain in her right hand and arm as well as her left shoulder and arm.  She has some chronic lymphedema neuropathy in her lower extremities but denies any acute injury to her legs today.  Patient did not lose consciousness.  She denies any severe headache.  No fevers or chills.  Home Medications Prior to Admission medications   Medication Sig Start Date End Date Taking? Authorizing Provider  acetaminophen (TYLENOL) 500 MG tablet Take 500 mg by mouth 2 (two) times daily as needed for moderate pain.   Yes [provider]  apixaban (ELIQUIS) 5 MG TABS tablet Take 5 mg by mouth 2 (two) times daily.   Yes [provider]  atorvastatin (LIPITOR) 10 MG tablet Take 10 mg by mouth daily. 09/08/22  Yes [provider]  diclofenac Sodium (VOLTAREN) 1 % GEL Apply 2 g topically 4 (four) times daily as needed (pain in both knees.). 08/11/20  Yes [provider]  furosemide (LASIX) 20 MG tablet Take 20 mg by mouth daily. 08/20/22  Yes [provider]  gabapentin (NEURONTIN) 400 MG capsule Take 400 mg  by mouth 3 (three) times daily.   Yes [provider]  labetalol (NORMODYNE) 100 MG tablet Take 100 mg by mouth 2 (two) times daily. 09/10/22  Yes [provider]  lidocaine (LIDODERM) 5 % Place 1 patch onto the skin daily. Remove & Discard patch within 12 hours or as directed by MD Patient taking differently: Place 1 patch onto the skin daily. Right knee. Remove & Discard patch within 12 hours or as directed by MD 11/01/22  Yes Henderly, Britni A, PA-C  polyethylene glycol (MIRALAX MIX-IN PAX) 17 g packet Take 17 g by mouth daily.   Yes [provider]  potassium chloride 20 MEQ/15ML (10%) SOLN Take 20 mEq by mouth daily. 08/19/22  Yes [provider]  sucralfate (CARAFATE) 1 GM/10ML suspension Take 1 g by mouth 2 (two) times daily. 09/07/22  Yes [provider]  traMADol HCl 100 MG TABS Take 100 mg by mouth every 4 (four) hours as needed (pain).   Yes [provider]  vitamin B-12 (CYANOCOBALAMIN) 500 MCG tablet Take 500 mcg by mouth daily.   Yes [provider]  [facility] VITAMIN B12 1000 MCG tablet Take 1,000 mcg by mouth daily. Patient not taking: Reported on 02/19/2023 07/11/22   [provider]  gabapentin (NEURONTIN) 300 MG capsule Take 1 capsule (300 mg total) by mouth 3 (three) times daily. Patient not  taking: Reported on 02/19/2023 08/21/20   Nita Sells, MD  mupirocin ointment (BACTROBAN) 2 % APPLY TO AFFECTED AREA TWICE A DAY Patient not taking: Reported on 02/19/2023 07/12/21   Trula Slade, DPM  rivaroxaban (XARELTO) 20 MG TABS tablet Take 1 tablet (20 mg total) by mouth daily with supper. Patient not taking: Reported on 02/19/2023 08/23/20   Mercy Riding, MD  [facility] [employer] [employer]) [phone]" PADS Apply 1 application  topically daily. 09/09/22   Mickie Hillier, PA-C  traMADol (ULTRAM) 50 MG tablet Take 1 tablet (50 mg total) by mouth every 8 (eight) hours. Patient not taking: Reported on 02/19/2023 11/01/22   Henderly,  Britni A, PA-C      Allergies    Tylenol [acetaminophen] and Sulfa antibiotics    Review of Systems   Review of Systems  Physical Exam Updated Vital Signs BP (!) 107/57   Pulse 86   Temp 98.9 F (37.2 C) (Oral)   Resp 13   Ht 1.575 m (5\' 2" )   Wt 88 kg   LMP  (LMP Unknown)   SpO2 99%   BMI 35.48 kg/m  Physical Exam Vitals and nursing note reviewed.  Constitutional:      Appearance: She is well-developed. She is not diaphoretic.  HENT:     Head: Normocephalic and atraumatic.     Right Ear: External ear normal.     Left Ear: External ear normal.  Eyes:     General: No scleral icterus.       Right eye: No discharge.        Left eye: No discharge.     Conjunctiva/sclera: Conjunctivae normal.  Neck:     Trachea: No tracheal deviation.  Cardiovascular:     Rate and Rhythm: Normal rate and regular rhythm.  Pulmonary:     Effort: Pulmonary effort is normal. No respiratory distress.     Breath sounds: Normal breath sounds. No stridor. No wheezing or rales.  Chest:     Comments: Mild tenderness palpation left posterior chest wall Abdominal:     General: Bowel sounds are normal. There is no distension.     Palpations: Abdomen is soft.     Tenderness: There is no abdominal tenderness. There is no guarding or rebound.  Musculoskeletal:        General: No deformity.     Cervical back: Neck supple. Tenderness present.     Thoracic back: No tenderness.     Lumbar back: No tenderness.     Right lower leg: Edema present.     Left lower leg: Edema present.     Comments: Small bruise noted on the dorsal aspect of the right hand, patient mentions this was from a blood draw and that is giving her pain and discomfort, no edema, effusions, deformities noted in any extremities, tenderness palpation left shoulder right hand right elbow and left forearm,  Skin:    General: Skin is warm and dry.     Findings: No rash.  Neurological:     General: No focal deficit present.     Mental  Status: She is alert.     Cranial Nerves: No cranial nerve deficit, dysarthria or facial asymmetry.     Sensory: No sensory deficit.     Motor: No abnormal muscle tone or seizure activity.     Coordination: Coordination normal.  Psychiatric:        Mood and Affect: Mood normal.     ED Results / Procedures / Treatments  Labs (all labs ordered are listed, but only abnormal results are displayed) Labs Reviewed - No data to display  EKG None  Radiology DG Chest 2 View  Result Date: 02/19/2023 CLINICAL DATA:  Fall. EXAM: CHEST - 2 VIEW COMPARISON:  August 16, 2020. FINDINGS: Stable cardiomegaly. Both lungs are clear. Degenerative changes are seen involving both glenohumeral joints. IMPRESSION: No active cardiopulmonary disease. Electronically Signed   By: Marijo Conception M.D.   On: 02/19/2023 11:06   DG Forearm Left  Result Date: 02/19/2023 CLINICAL DATA:  Left arm pain after fall. EXAM: LEFT FOREARM - 2 VIEW COMPARISON:  None Available. FINDINGS: There is no evidence of fracture or other focal bone lesions. Soft tissues are unremarkable. IMPRESSION: Negative. Electronically Signed   By: Marijo Conception M.D.   On: 02/19/2023 11:05   DG Shoulder Left  Result Date: 02/19/2023 CLINICAL DATA:  Left shoulder pain after fall. EXAM: LEFT SHOULDER - 2+ VIEW COMPARISON:  None Available. FINDINGS: There is no evidence of fracture or dislocation. Moderate narrowing and osteophyte formation of glenohumeral joint is noted. Soft tissues are unremarkable. IMPRESSION: Moderate osteoarthritis of left glenohumeral joint. No acute abnormality seen. Electronically Signed   By: Marijo Conception M.D.   On: 02/19/2023 11:04   DG Elbow Complete Right  Result Date: 02/19/2023 CLINICAL DATA:  Right elbow pain after fall. EXAM: RIGHT ELBOW - COMPLETE 3+ VIEW COMPARISON:  None Available. FINDINGS: There is no evidence of fracture, dislocation, or joint effusion. There is no evidence of arthropathy or other focal  bone abnormality. Soft tissues are unremarkable. IMPRESSION: Negative. Electronically Signed   By: Marijo Conception M.D.   On: 02/19/2023 11:02   DG Hand Complete Right  Result Date: 02/19/2023 CLINICAL DATA:  Right hand pain after fall. EXAM: RIGHT HAND - COMPLETE 3+ VIEW COMPARISON:  None Available. FINDINGS: There is no evidence of fracture or dislocation. Moderate degenerative changes seen involving the first carpometacarpal joint as well as the first interphalangeal joint. Soft tissues are unremarkable. IMPRESSION: Osteoarthritis involving first carpometacarpal joint as well as first interphalangeal joint. No acute abnormality seen. Electronically Signed   By: Marijo Conception M.D.   On: 02/19/2023 11:01   CT Cervical Spine Wo Contrast  Result Date: 02/19/2023 CLINICAL DATA:  Neck trauma.  Fell from bed. EXAM: CT CERVICAL SPINE WITHOUT CONTRAST TECHNIQUE: Multidetector CT imaging of the cervical spine was performed without intravenous contrast. Multiplanar CT image reconstructions were also generated. RADIATION DOSE REDUCTION: This exam was performed according to the departmental dose-optimization program which includes automated exposure control, adjustment of the mA and/or kV according to patient size and/or use of iterative reconstruction technique. COMPARISON:  Chest CT 07/19/2020. FINDINGS: Alignment: There is no acute post-traumatic malalignment of the cervical spine. Anterolisthesis of C7 on T1 measures 3 mm. Skull base and vertebrae: No acute fracture. No primary bone lesion or focal pathologic process. Soft tissues and spinal canal: No prevertebral fluid or swelling. No visible canal hematoma. Disc levels: Multilevel disc space narrowing and endplate spurring noted. This is most in severe at the C5-6 level. Bilateral facet arthropathy noted. Solid fusion of T1 and T2 disc space noted. Upper chest: Subpleural nodule in the anterolateral right upper lobe is unchanged from 07/19/2020 measuring 4 mm.  Mosaic attenuation pattern noted within the visualized upper lobes. Other: None. IMPRESSION: 1. No evidence for acute fracture or subluxation of the cervical spine. 2. 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 3. 4 mm subpleural nodule  in the anterolateral right upper lobe is unchanged from 07/19/2020. Electronically Signed   By: Kerby Moors M.D.   On: 02/19/2023 10:53   CT Head Wo Contrast  Result Date: 02/19/2023 CLINICAL DATA:  Trauma, fall EXAM: CT HEAD WITHOUT CONTRAST TECHNIQUE: Contiguous axial images were obtained from the base of the skull through the vertex without intravenous contrast. RADIATION DOSE REDUCTION: This exam was performed according to the departmental dose-optimization program which includes automated exposure control, adjustment of the mA and/or kV according to patient size and/or use of iterative reconstruction technique. COMPARISON:  MR brain done on 11/19/2020 FINDINGS: Brain: No acute intracranial findings are seen. There are no signs of bleeding within the cranium. Ventricles are not dilated. Cortical sulci are prominent. Vascular: Unremarkable. Skull: No fracture is seen in the calvarium. Sinuses/Orbits: No acute findings are seen. Other: None. IMPRESSION: No acute intracranial findings are seen in noncontrast CT brain. Atrophy. Electronically Signed   By: Elmer Picker M.D.   On: 02/19/2023 10:49    Procedures Procedures    Medications Ordered in ED Medications  oxyCODONE (Oxy IR/ROXICODONE) immediate release tablet 5 mg (5 mg Oral Given 02/19/23 1025)    ED Course/ Medical Decision Making/ A&P Clinical Course as of 02/19/23 1126  Thu Feb 19, 2023  1107 CT of the head and C-spine without acute findings [JK]  1107 X-ray of the hand shows osteoarthritis [JK]  1107 Elbow without acute fracture [JK]  1108 Shoulder x-ray shows moderate arthritis in the left shoulder [JK]  1108 Forearm negative [JK]  1111 Chest x-ray without acute findings [JK]     Clinical Course User Index [JK] Dorie Rank, MD                             Medical Decision Making Problems Addressed: Arthritis of finger of right hand: chronic illness or injury with exacerbation, progression, or side effects of treatment Arthritis, shoulder region: chronic illness or injury with exacerbation, progression, or side effects of treatment Fall, initial encounter: acute illness or injury  Amount and/or Complexity of Data Reviewed Radiology: ordered and independent interpretation performed.  Risk Prescription drug management.   Patient presented to the ED for evaluation of a fall.  Patient is currently residing in a nursing facility.  Patient fell out of the bed.  And is at high risk for injury because of her anticoagulation use.  Fortunately no signs of any serious injury.  Head CT and C-spine CT do not see any signs of fracture or internal bleeding.  Patient was also complaining of pain in her shoulder and her hand.  Some this was ongoing prior to the fall but was exacerbated.  She also has chronic lower extremity pain associated with neuropathy.  No evidence of any injury on lower extremity exam.  She had no tenderness palpation.  Her shoulder and hand x-rays show evidence of arthritis but no acute fracture.  Patient also was asking to go to a different nursing facility.  Explained to the patient that that is something she will need to arrange with her family and the nursing home.  Not something that we can directly manage here in the ED.  Evaluation and diagnostic testing in the emergency department does not suggest an emergent condition requiring admission or immediate intervention beyond what has been performed at this time.  The patient is safe for discharge and has been instructed to return immediately for worsening symptoms, change in symptoms  or any other concerns.        Final Clinical Impression(s) / ED Diagnoses Final diagnoses:  Fall, initial encounter   Arthritis, shoulder region  Arthritis of finger of right hand    Rx / DC Orders ED Discharge Orders     None         Dorie Rank, MD 02/19/23 1126

## 2023-02-19 NOTE — ED Triage Notes (Addendum)
Pt present to ED from [facility] facility d/t fall at 0430 this morning. Pt states to being asked to turn to right side in bed. Pt unable to assist with turn and fell out of bed onto right side. Pt states to hitting right side of head and neck on chair.Pt denies LOC but c/o right side pain. Pt A&Ox4 at this time. Pt currently on eloquis. Pt also c/o chronic BLE pain.

## 2023-02-19 NOTE — Discharge Instructions (Signed)
The x-rays did not show any signs of any acute fracture or internal injury or bleeding.  You do have arthritis in your left shoulder and right hand which can cause trouble with pain and discomfort in those joints.  Take Tylenol to help with the pain.  Consider following up with an orthopedic doctor for further evaluation

## 2023-02-20 DIAGNOSIS — R278 Other lack of coordination: Secondary | ICD-10-CM | POA: Diagnosis not present

## 2023-02-20 DIAGNOSIS — M19011 Primary osteoarthritis, right shoulder: Secondary | ICD-10-CM | POA: Diagnosis not present

## 2023-02-20 DIAGNOSIS — M79604 Pain in right leg: Secondary | ICD-10-CM | POA: Diagnosis not present

## 2023-02-20 DIAGNOSIS — R293 Abnormal posture: Secondary | ICD-10-CM | POA: Diagnosis not present

## 2023-02-20 DIAGNOSIS — M79605 Pain in left leg: Secondary | ICD-10-CM | POA: Diagnosis not present

## 2023-02-20 DIAGNOSIS — R262 Difficulty in walking, not elsewhere classified: Secondary | ICD-10-CM | POA: Diagnosis not present

## 2023-02-20 DIAGNOSIS — M6281 Muscle weakness (generalized): Secondary | ICD-10-CM | POA: Diagnosis not present

## 2023-02-20 DIAGNOSIS — I890 Lymphedema, not elsewhere classified: Secondary | ICD-10-CM | POA: Diagnosis not present

## 2023-02-20 DIAGNOSIS — R2681 Unsteadiness on feet: Secondary | ICD-10-CM | POA: Diagnosis not present

## 2023-02-23 DIAGNOSIS — I890 Lymphedema, not elsewhere classified: Secondary | ICD-10-CM | POA: Diagnosis not present

## 2023-02-23 DIAGNOSIS — R293 Abnormal posture: Secondary | ICD-10-CM | POA: Diagnosis not present

## 2023-02-23 DIAGNOSIS — M6281 Muscle weakness (generalized): Secondary | ICD-10-CM | POA: Diagnosis not present

## 2023-02-23 DIAGNOSIS — M19011 Primary osteoarthritis, right shoulder: Secondary | ICD-10-CM | POA: Diagnosis not present

## 2023-02-23 DIAGNOSIS — R262 Difficulty in walking, not elsewhere classified: Secondary | ICD-10-CM | POA: Diagnosis not present

## 2023-02-23 DIAGNOSIS — M79605 Pain in left leg: Secondary | ICD-10-CM | POA: Diagnosis not present

## 2023-02-23 DIAGNOSIS — M79604 Pain in right leg: Secondary | ICD-10-CM | POA: Diagnosis not present

## 2023-02-23 DIAGNOSIS — G894 Chronic pain syndrome: Secondary | ICD-10-CM | POA: Diagnosis not present

## 2023-02-23 DIAGNOSIS — R278 Other lack of coordination: Secondary | ICD-10-CM | POA: Diagnosis not present

## 2023-02-23 DIAGNOSIS — R2681 Unsteadiness on feet: Secondary | ICD-10-CM | POA: Diagnosis not present

## 2023-02-23 DIAGNOSIS — R911 Solitary pulmonary nodule: Secondary | ICD-10-CM | POA: Diagnosis not present

## 2023-02-23 DIAGNOSIS — M25512 Pain in left shoulder: Secondary | ICD-10-CM | POA: Diagnosis not present

## 2023-02-23 DIAGNOSIS — I10 Essential (primary) hypertension: Secondary | ICD-10-CM | POA: Diagnosis not present

## 2023-02-23 DIAGNOSIS — I482 Chronic atrial fibrillation, unspecified: Secondary | ICD-10-CM | POA: Diagnosis not present

## 2023-02-24 DIAGNOSIS — R2681 Unsteadiness on feet: Secondary | ICD-10-CM | POA: Diagnosis not present

## 2023-02-24 DIAGNOSIS — M6281 Muscle weakness (generalized): Secondary | ICD-10-CM | POA: Diagnosis not present

## 2023-02-24 DIAGNOSIS — M79604 Pain in right leg: Secondary | ICD-10-CM | POA: Diagnosis not present

## 2023-02-24 DIAGNOSIS — M79605 Pain in left leg: Secondary | ICD-10-CM | POA: Diagnosis not present

## 2023-02-24 DIAGNOSIS — R278 Other lack of coordination: Secondary | ICD-10-CM | POA: Diagnosis not present

## 2023-02-24 DIAGNOSIS — R262 Difficulty in walking, not elsewhere classified: Secondary | ICD-10-CM | POA: Diagnosis not present

## 2023-02-24 DIAGNOSIS — M19011 Primary osteoarthritis, right shoulder: Secondary | ICD-10-CM | POA: Diagnosis not present

## 2023-02-24 DIAGNOSIS — R293 Abnormal posture: Secondary | ICD-10-CM | POA: Diagnosis not present

## 2023-02-24 DIAGNOSIS — I890 Lymphedema, not elsewhere classified: Secondary | ICD-10-CM | POA: Diagnosis not present

## 2023-02-25 DIAGNOSIS — R278 Other lack of coordination: Secondary | ICD-10-CM | POA: Diagnosis not present

## 2023-02-25 DIAGNOSIS — M19011 Primary osteoarthritis, right shoulder: Secondary | ICD-10-CM | POA: Diagnosis not present

## 2023-02-25 DIAGNOSIS — R262 Difficulty in walking, not elsewhere classified: Secondary | ICD-10-CM | POA: Diagnosis not present

## 2023-02-25 DIAGNOSIS — M79605 Pain in left leg: Secondary | ICD-10-CM | POA: Diagnosis not present

## 2023-02-25 DIAGNOSIS — M6281 Muscle weakness (generalized): Secondary | ICD-10-CM | POA: Diagnosis not present

## 2023-02-25 DIAGNOSIS — I890 Lymphedema, not elsewhere classified: Secondary | ICD-10-CM | POA: Diagnosis not present

## 2023-02-25 DIAGNOSIS — R2681 Unsteadiness on feet: Secondary | ICD-10-CM | POA: Diagnosis not present

## 2023-02-25 DIAGNOSIS — M79604 Pain in right leg: Secondary | ICD-10-CM | POA: Diagnosis not present

## 2023-02-25 DIAGNOSIS — R293 Abnormal posture: Secondary | ICD-10-CM | POA: Diagnosis not present

## 2023-02-26 DIAGNOSIS — M6281 Muscle weakness (generalized): Secondary | ICD-10-CM | POA: Diagnosis not present

## 2023-02-26 DIAGNOSIS — M19011 Primary osteoarthritis, right shoulder: Secondary | ICD-10-CM | POA: Diagnosis not present

## 2023-02-26 DIAGNOSIS — M79605 Pain in left leg: Secondary | ICD-10-CM | POA: Diagnosis not present

## 2023-02-26 DIAGNOSIS — M79604 Pain in right leg: Secondary | ICD-10-CM | POA: Diagnosis not present

## 2023-02-26 DIAGNOSIS — R262 Difficulty in walking, not elsewhere classified: Secondary | ICD-10-CM | POA: Diagnosis not present

## 2023-02-26 DIAGNOSIS — R278 Other lack of coordination: Secondary | ICD-10-CM | POA: Diagnosis not present

## 2023-02-26 DIAGNOSIS — R293 Abnormal posture: Secondary | ICD-10-CM | POA: Diagnosis not present

## 2023-02-26 DIAGNOSIS — I890 Lymphedema, not elsewhere classified: Secondary | ICD-10-CM | POA: Diagnosis not present

## 2023-02-26 DIAGNOSIS — R2681 Unsteadiness on feet: Secondary | ICD-10-CM | POA: Diagnosis not present

## 2023-02-27 ENCOUNTER — Encounter: Payer: Self-pay | Admitting: Physician Assistant

## 2023-02-27 ENCOUNTER — Ambulatory Visit (INDEPENDENT_AMBULATORY_CARE_PROVIDER_SITE_OTHER): Payer: 59 | Admitting: Physician Assistant

## 2023-02-27 DIAGNOSIS — M25512 Pain in left shoulder: Secondary | ICD-10-CM | POA: Diagnosis not present

## 2023-02-27 DIAGNOSIS — L89152 Pressure ulcer of sacral region, stage 2: Secondary | ICD-10-CM | POA: Diagnosis not present

## 2023-02-27 NOTE — Progress Notes (Signed)
Office Visit Note   Patient: Lauren Santana           Date of Birth: May 05, 1951           MRN: [id number] Visit Date: 02/27/2023              Requested by: Renaye Rakers, MD [address] [place],  [place] [address] PCP: Renaye Rakers, MD  Chief Complaint  Patient presents with   Left Shoulder - Pain      HPI: Patient is a 72 year old woman who I have seen in the past for arthritis of her right knee.  She comes in today a week status post mechanical fall at her nursing facility.  She was taken to the emergency room and was evaluated as a level 2 trauma because she is on chronic anticoagulation.  She was referred to me for follow-up on her left shoulder.  She comes in today saying that everything hurts and she has numbness in her hands and has poor use of both of her upper extremities.  She also has a history of neuropathy.  Assessment & Plan: Visit Diagnoses: Left shoulder pain  Plan: She may very well have a complete tear of the rotator cuff of the left shoulder.  She does have moderate plus arthritis of the glenohumeral joint and the [facility] joint.  We could order an MRI but she would not be a good surgical at candidate as she would require a reverse total shoulder replacement.  Her exam today I cannot appreciate any ecchymosis she does have some swelling in her hand and arm but I think this is because it has been in a dependent position because she cannot lift it.  She also has limited use of her right hand.  She did have a CT scan of her neck at the hospital which did not show any acute fractures.  Does have some significant arthritis and certainly could have this as a source of her pain.  I explained to her and her son this is a little beyond my expertise.  I think it is important that she visit with her regular primary care doctor who knows her history.  She may also benefit from a neurologic workup.  From my standpoint I will give her a sling that just can be used for comfort.  Would  also like for her to receive physical therapy at the nursing facility to help with range of motion of her arms.  Do not see any acute fractures and she has no tenderness to palpation of her humerus or forearm.  No ecchymosis.  She has a strong radial pulse.  Follow-Up Instructions: as needed  Ortho Exam  Patient is alert, oriented, no adenopathy, well-dressed, normal affect, normal respiratory effort. Examination of her left arm she does have some soft tissue swelling going down into her hand but no redness no erythema.  She is able to move her hand.  She has a positive drop arm test.  She can has weak flexion of her arm she does have fairly good grip strength on the left.  Difficult to isolate her pain as she says she hurts all over.  She has no tenderness to palpation over her neck.  She says she does have paresthesias in her hand but does have a history of neuropathy.  Imaging: No results found. No images are attached to the encounter.  Labs: Lab Results  Component Value Date   HGBA1C 4.6 (L) 08/16/2020  HGBA1C 5.1 01/17/2020   ESRSEDRATE 80 (H) 03/14/2019   ESRSEDRATE 21 01/25/2019   ESRSEDRATE 90 (H) 01/03/2019   CRP 40 (H) 03/14/2019   CRP 6 01/03/2019   LABURIC 6.8 02/21/2010   REPTSTATUS 08/20/2020 FINAL 08/17/2020   GRAMSTAIN  08/17/2020    ABUNDANT WBC PRESENT, PREDOMINANTLY PMN RARE GRAM NEGATIVE RODS Performed at [facility], [address] [place], [place] [address]    CULT RARE KLEBSIELLA PNEUMONIAE 08/17/2020   LABORGA KLEBSIELLA PNEUMONIAE 08/17/2020     Lab Results  Component Value Date   ALBUMIN 2.2 (L) 08/23/2020   ALBUMIN 2.2 (L) 08/21/2020   ALBUMIN 2.1 (L) 08/20/2020    Lab Results  Component Value Date   MG 1.7 08/23/2020   MG 1.8 08/19/2020   MG 1.9 07/25/2020   No results found for: "VD25OH"  No results found for: "PREALBUMIN"    Latest Ref Rng & Units 11/09/2022    1:14 AM 09/14/2022    7:36 AM 09/03/2020    3:01 PM  CBC  EXTENDED  WBC 4.0 - 10.5 K/uL 4.5  3.9  5.2   RBC 3.87 - 5.11 MIL/uL 3.76  3.74  3.87   Hemoglobin 12.0 - 15.0 g/dL 60.412.3  54.012.0  98.111.8   HCT 36.0 - 46.0 % 39.2  38.7  36.4   Platelets 150 - 400 K/uL 101  100  194   NEUT# 1.7 - 7.7 K/uL 2.6  1.4    Lymph# 0.7 - 4.0 K/uL 0.9  1.8       There is no height or weight on file to calculate BMI.  Orders:  No orders of the defined types were placed in this encounter.  No orders of the defined types were placed in this encounter.    Procedures: No procedures performed  Clinical Data: No additional findings.  ROS:  All other systems negative, except as noted in the HPI. Review of Systems  Objective: Vital Signs: LMP  (LMP Unknown)   Specialty Comments:  No specialty comments available.  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   S/P BSO (bilateral salpingo-oophorectomy) 01/08/2017   S/P hysterectomy 01/08/2017   Tachycardia    Acute deep vein thrombosis (DVT) of right lower extremity 02/14/2016   Pulmonary emboli 02/13/2016   PE (pulmonary embolism) 02/13/2016   AKI (acute kidney injury) 02/13/2016   Pulmonary embolism with acute cor pulmonale    Pelvic mass in female    Obesity (BMI 30-39.9) 08/20/2015   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   Essential hypertension, benign 07/23/2014   Edema 07/23/2014   Dyslipidemia 07/23/2014   Lower extremity numbness 07/23/2014   Arthritis of knee 07/17/2014   Postmenopausal bleeding 03/10/2012   Endometriosis of pelvis 01/22/2007    Past Medical History:  Diagnosis Date   Arthritis    Edema    B/LLE   Endometriosis    High cholesterol    Hypertension    Numbness    feet   Pre-diabetes    Pulmonary embolism (HCC)    recurrent, second episode  in 06/2020   Walker as ambulation aid    Wears dentures    Wears glasses     Family History  Problem Relation Age of Onset   Colon cancer Sister    Cancer Sister        unsure of type of cancer    Past Surgical History:  Procedure Laterality Date   ABDOMINAL HYSTERECTOMY     and BSO   CATARACT EXTRACTION Left 10/2018   CESAREAN SECTION  1969, 1971   COLONOSCOPY     EYE SURGERY Bilateral    CATARACT SX   HERNIA REPAIR  2001   incisional   IR CATHETER TUBE CHANGE  10/12/2020   IR CATHETER TUBE CHANGE  12/07/2020   IR CHOLANGIOGRAM EXISTING TUBE  09/28/2020   IR PERC CHOLECYSTOSTOMY  08/17/2020   IR RADIOLOGIST EVAL & MGMT  11/22/2020   JOINT REPLACEMENT Left    Knee   MAXILLARY ANTROSTOMY Right 01/20/2020   Procedure: MAXILLARY ANTROSTOMY  WITH BIOPSY CALDWELL APPROACH;  Surgeon: Osborn CohoShoemaker, David, MD;  [facility];  Service: ENT;  Laterality: Right;   MULTIPLE TOOTH EXTRACTIONS  2011   NASAL ENDOSCOPY Right 02/17/2020   Procedure: NASAL ENDOSCOPY WITH RIGHT INFERIOR TURNBINATE REDUCTION;  Surgeon: Osborn CohoShoemaker, David, MD;  [facility];  Service: ENT;  Laterality: Right;   neck mass removal     "fatty tissue, not cancerous" per pt's son   SINUS ENDO WITH FUSION Right 01/20/2020   Procedure: SINUS ENDO WITH FUSION WITH BIOSPY;  Surgeon: Osborn CohoShoemaker, David, MD;  [facility];  Service: ENT;  Laterality: Right;   TOTAL KNEE ARTHROPLASTY Left 07/17/2014   Procedure: TOTAL KNEE ARTHROPLASTY;  Surgeon: Nestor LewandowskyFrank J Rowan, MD;  Location: [facility] OR;  Service: Orthopedics;  Laterality: Left;   Social History   Occupational History   Not on file  Tobacco Use   Smoking status: Never   Smokeless tobacco: Never  Vaping Use   Vaping Use: Never used  Substance and Sexual  Activity   Alcohol use: No   Drug use: No   Sexual activity: Not Currently    Comment: Hysterectoomy

## 2023-02-28 DIAGNOSIS — M79604 Pain in right leg: Secondary | ICD-10-CM | POA: Diagnosis not present

## 2023-02-28 DIAGNOSIS — M19011 Primary osteoarthritis, right shoulder: Secondary | ICD-10-CM | POA: Diagnosis not present

## 2023-02-28 DIAGNOSIS — M6281 Muscle weakness (generalized): Secondary | ICD-10-CM | POA: Diagnosis not present

## 2023-02-28 DIAGNOSIS — R278 Other lack of coordination: Secondary | ICD-10-CM | POA: Diagnosis not present

## 2023-02-28 DIAGNOSIS — I890 Lymphedema, not elsewhere classified: Secondary | ICD-10-CM | POA: Diagnosis not present

## 2023-02-28 DIAGNOSIS — R293 Abnormal posture: Secondary | ICD-10-CM | POA: Diagnosis not present

## 2023-02-28 DIAGNOSIS — R2681 Unsteadiness on feet: Secondary | ICD-10-CM | POA: Diagnosis not present

## 2023-02-28 DIAGNOSIS — R262 Difficulty in walking, not elsewhere classified: Secondary | ICD-10-CM | POA: Diagnosis not present

## 2023-02-28 DIAGNOSIS — M79605 Pain in left leg: Secondary | ICD-10-CM | POA: Diagnosis not present

## 2023-03-02 DIAGNOSIS — M79604 Pain in right leg: Secondary | ICD-10-CM | POA: Diagnosis not present

## 2023-03-02 DIAGNOSIS — M79605 Pain in left leg: Secondary | ICD-10-CM | POA: Diagnosis not present

## 2023-03-02 DIAGNOSIS — R262 Difficulty in walking, not elsewhere classified: Secondary | ICD-10-CM | POA: Diagnosis not present

## 2023-03-02 DIAGNOSIS — M19011 Primary osteoarthritis, right shoulder: Secondary | ICD-10-CM | POA: Diagnosis not present

## 2023-03-02 DIAGNOSIS — R2681 Unsteadiness on feet: Secondary | ICD-10-CM | POA: Diagnosis not present

## 2023-03-02 DIAGNOSIS — I890 Lymphedema, not elsewhere classified: Secondary | ICD-10-CM | POA: Diagnosis not present

## 2023-03-02 DIAGNOSIS — R293 Abnormal posture: Secondary | ICD-10-CM | POA: Diagnosis not present

## 2023-03-02 DIAGNOSIS — M6281 Muscle weakness (generalized): Secondary | ICD-10-CM | POA: Diagnosis not present

## 2023-03-02 DIAGNOSIS — R278 Other lack of coordination: Secondary | ICD-10-CM | POA: Diagnosis not present

## 2023-03-03 DIAGNOSIS — I890 Lymphedema, not elsewhere classified: Secondary | ICD-10-CM | POA: Diagnosis not present

## 2023-03-03 DIAGNOSIS — M19011 Primary osteoarthritis, right shoulder: Secondary | ICD-10-CM | POA: Diagnosis not present

## 2023-03-03 DIAGNOSIS — R262 Difficulty in walking, not elsewhere classified: Secondary | ICD-10-CM | POA: Diagnosis not present

## 2023-03-03 DIAGNOSIS — M6281 Muscle weakness (generalized): Secondary | ICD-10-CM | POA: Diagnosis not present

## 2023-03-03 DIAGNOSIS — R278 Other lack of coordination: Secondary | ICD-10-CM | POA: Diagnosis not present

## 2023-03-03 DIAGNOSIS — R2681 Unsteadiness on feet: Secondary | ICD-10-CM | POA: Diagnosis not present

## 2023-03-03 DIAGNOSIS — M79605 Pain in left leg: Secondary | ICD-10-CM | POA: Diagnosis not present

## 2023-03-03 DIAGNOSIS — R293 Abnormal posture: Secondary | ICD-10-CM | POA: Diagnosis not present

## 2023-03-03 DIAGNOSIS — M79604 Pain in right leg: Secondary | ICD-10-CM | POA: Diagnosis not present

## 2023-03-04 DIAGNOSIS — R293 Abnormal posture: Secondary | ICD-10-CM | POA: Diagnosis not present

## 2023-03-04 DIAGNOSIS — M19011 Primary osteoarthritis, right shoulder: Secondary | ICD-10-CM | POA: Diagnosis not present

## 2023-03-04 DIAGNOSIS — R278 Other lack of coordination: Secondary | ICD-10-CM | POA: Diagnosis not present

## 2023-03-04 DIAGNOSIS — M79604 Pain in right leg: Secondary | ICD-10-CM | POA: Diagnosis not present

## 2023-03-04 DIAGNOSIS — M6281 Muscle weakness (generalized): Secondary | ICD-10-CM | POA: Diagnosis not present

## 2023-03-04 DIAGNOSIS — R2681 Unsteadiness on feet: Secondary | ICD-10-CM | POA: Diagnosis not present

## 2023-03-04 DIAGNOSIS — M79605 Pain in left leg: Secondary | ICD-10-CM | POA: Diagnosis not present

## 2023-03-04 DIAGNOSIS — I890 Lymphedema, not elsewhere classified: Secondary | ICD-10-CM | POA: Diagnosis not present

## 2023-03-04 DIAGNOSIS — R262 Difficulty in walking, not elsewhere classified: Secondary | ICD-10-CM | POA: Diagnosis not present

## 2023-03-05 DIAGNOSIS — M79605 Pain in left leg: Secondary | ICD-10-CM | POA: Diagnosis not present

## 2023-03-05 DIAGNOSIS — R2681 Unsteadiness on feet: Secondary | ICD-10-CM | POA: Diagnosis not present

## 2023-03-05 DIAGNOSIS — M19011 Primary osteoarthritis, right shoulder: Secondary | ICD-10-CM | POA: Diagnosis not present

## 2023-03-05 DIAGNOSIS — M6281 Muscle weakness (generalized): Secondary | ICD-10-CM | POA: Diagnosis not present

## 2023-03-05 DIAGNOSIS — I890 Lymphedema, not elsewhere classified: Secondary | ICD-10-CM | POA: Diagnosis not present

## 2023-03-05 DIAGNOSIS — M79604 Pain in right leg: Secondary | ICD-10-CM | POA: Diagnosis not present

## 2023-03-05 DIAGNOSIS — R293 Abnormal posture: Secondary | ICD-10-CM | POA: Diagnosis not present

## 2023-03-05 DIAGNOSIS — R262 Difficulty in walking, not elsewhere classified: Secondary | ICD-10-CM | POA: Diagnosis not present

## 2023-03-05 DIAGNOSIS — R278 Other lack of coordination: Secondary | ICD-10-CM | POA: Diagnosis not present

## 2023-03-06 DIAGNOSIS — R2681 Unsteadiness on feet: Secondary | ICD-10-CM | POA: Diagnosis not present

## 2023-03-06 DIAGNOSIS — I2699 Other pulmonary embolism without acute cor pulmonale: Secondary | ICD-10-CM | POA: Diagnosis not present

## 2023-03-06 DIAGNOSIS — I890 Lymphedema, not elsewhere classified: Secondary | ICD-10-CM | POA: Diagnosis not present

## 2023-03-06 DIAGNOSIS — R531 Weakness: Secondary | ICD-10-CM | POA: Diagnosis not present

## 2023-03-06 DIAGNOSIS — M79604 Pain in right leg: Secondary | ICD-10-CM | POA: Diagnosis not present

## 2023-03-06 DIAGNOSIS — R262 Difficulty in walking, not elsewhere classified: Secondary | ICD-10-CM | POA: Diagnosis not present

## 2023-03-06 DIAGNOSIS — R278 Other lack of coordination: Secondary | ICD-10-CM | POA: Diagnosis not present

## 2023-03-06 DIAGNOSIS — M6281 Muscle weakness (generalized): Secondary | ICD-10-CM | POA: Diagnosis not present

## 2023-03-06 DIAGNOSIS — M19011 Primary osteoarthritis, right shoulder: Secondary | ICD-10-CM | POA: Diagnosis not present

## 2023-03-06 DIAGNOSIS — M79605 Pain in left leg: Secondary | ICD-10-CM | POA: Diagnosis not present

## 2023-03-06 DIAGNOSIS — R293 Abnormal posture: Secondary | ICD-10-CM | POA: Diagnosis not present

## 2023-03-06 DIAGNOSIS — I480 Paroxysmal atrial fibrillation: Secondary | ICD-10-CM | POA: Diagnosis not present

## 2023-03-06 DIAGNOSIS — C8510 Unspecified B-cell lymphoma, unspecified site: Secondary | ICD-10-CM | POA: Diagnosis not present

## 2023-03-09 DIAGNOSIS — M19011 Primary osteoarthritis, right shoulder: Secondary | ICD-10-CM | POA: Diagnosis not present

## 2023-03-09 DIAGNOSIS — M150 Primary generalized (osteo)arthritis: Secondary | ICD-10-CM | POA: Diagnosis not present

## 2023-03-09 DIAGNOSIS — R278 Other lack of coordination: Secondary | ICD-10-CM | POA: Diagnosis not present

## 2023-03-09 DIAGNOSIS — M79604 Pain in right leg: Secondary | ICD-10-CM | POA: Diagnosis not present

## 2023-03-09 DIAGNOSIS — M6281 Muscle weakness (generalized): Secondary | ICD-10-CM | POA: Diagnosis not present

## 2023-03-09 DIAGNOSIS — M79605 Pain in left leg: Secondary | ICD-10-CM | POA: Diagnosis not present

## 2023-03-09 DIAGNOSIS — R600 Localized edema: Secondary | ICD-10-CM | POA: Diagnosis not present

## 2023-03-09 DIAGNOSIS — R293 Abnormal posture: Secondary | ICD-10-CM | POA: Diagnosis not present

## 2023-03-09 DIAGNOSIS — R262 Difficulty in walking, not elsewhere classified: Secondary | ICD-10-CM | POA: Diagnosis not present

## 2023-03-09 DIAGNOSIS — R2681 Unsteadiness on feet: Secondary | ICD-10-CM | POA: Diagnosis not present

## 2023-03-09 DIAGNOSIS — I890 Lymphedema, not elsewhere classified: Secondary | ICD-10-CM | POA: Diagnosis not present

## 2023-03-09 DIAGNOSIS — G894 Chronic pain syndrome: Secondary | ICD-10-CM | POA: Diagnosis not present

## 2023-03-09 DIAGNOSIS — I482 Chronic atrial fibrillation, unspecified: Secondary | ICD-10-CM | POA: Diagnosis not present

## 2023-03-09 DIAGNOSIS — I10 Essential (primary) hypertension: Secondary | ICD-10-CM | POA: Diagnosis not present

## 2023-03-10 ENCOUNTER — Emergency Department (HOSPITAL_COMMUNITY)
Admission: EM | Admit: 2023-03-10 | Discharge: 2023-03-10 | Disposition: A | Discharge status: Skilled Nursing Facility | Payer: 59 | Attending: Emergency Medicine | Admitting: Emergency Medicine

## 2023-03-10 ENCOUNTER — Encounter (HOSPITAL_COMMUNITY): Payer: Self-pay | Admitting: Emergency Medicine

## 2023-03-10 DIAGNOSIS — I4891 Unspecified atrial fibrillation: Secondary | ICD-10-CM | POA: Diagnosis present

## 2023-03-10 DIAGNOSIS — I10 Essential (primary) hypertension: Secondary | ICD-10-CM | POA: Diagnosis not present

## 2023-03-10 DIAGNOSIS — K920 Hematemesis: Secondary | ICD-10-CM | POA: Insufficient documentation

## 2023-03-10 DIAGNOSIS — D7589 Other specified diseases of blood and blood-forming organs: Secondary | ICD-10-CM | POA: Diagnosis not present

## 2023-03-10 DIAGNOSIS — Z7901 Long term (current) use of anticoagulants: Secondary | ICD-10-CM | POA: Diagnosis not present

## 2023-03-10 DIAGNOSIS — R1111 Vomiting without nausea: Secondary | ICD-10-CM | POA: Diagnosis not present

## 2023-03-10 DIAGNOSIS — Z79899 Other long term (current) drug therapy: Secondary | ICD-10-CM | POA: Diagnosis not present

## 2023-03-10 DIAGNOSIS — Z743 Need for continuous supervision: Secondary | ICD-10-CM | POA: Diagnosis not present

## 2023-03-10 DIAGNOSIS — R262 Difficulty in walking, not elsewhere classified: Secondary | ICD-10-CM | POA: Diagnosis present

## 2023-03-10 DIAGNOSIS — E785 Hyperlipidemia, unspecified: Secondary | ICD-10-CM | POA: Diagnosis present

## 2023-03-10 DIAGNOSIS — K922 Gastrointestinal hemorrhage, unspecified: Secondary | ICD-10-CM | POA: Diagnosis not present

## 2023-03-10 DIAGNOSIS — R29898 Other symptoms and signs involving the musculoskeletal system: Secondary | ICD-10-CM | POA: Diagnosis not present

## 2023-03-10 DIAGNOSIS — Z7401 Bed confinement status: Secondary | ICD-10-CM | POA: Diagnosis not present

## 2023-03-10 DIAGNOSIS — G8929 Other chronic pain: Secondary | ICD-10-CM | POA: Diagnosis present

## 2023-03-10 DIAGNOSIS — R7309 Other abnormal glucose: Secondary | ICD-10-CM | POA: Insufficient documentation

## 2023-03-10 LAB — TYPE AND SCREEN
ABO/RH(D): O POS
Antibody Screen: NEGATIVE

## 2023-03-10 LAB — CBC WITH DIFFERENTIAL/PLATELET
Abs Immature Granulocytes: 0.01 10*3/uL (ref 0.00–0.07)
Basophils Absolute: 0 10*3/uL (ref 0.0–0.1)
Basophils Relative: 1 %
Eosinophils Absolute: 0 10*3/uL (ref 0.0–0.5)
Eosinophils Relative: 0 %
HCT: 40.7 % (ref 36.0–46.0)
Hemoglobin: 12.5 g/dL (ref 12.0–15.0)
Immature Granulocytes: 0 %
Lymphocytes Relative: 27 %
Lymphs Abs: 1.7 10*3/uL (ref 0.7–4.0)
MCH: 32.3 pg (ref 26.0–34.0)
MCHC: 30.7 g/dL (ref 30.0–36.0)
MCV: 105.2 fL — ABNORMAL HIGH (ref 80.0–100.0)
Monocytes Absolute: 0.4 10*3/uL (ref 0.1–1.0)
Monocytes Relative: 6 %
Neutro Abs: 4.1 10*3/uL (ref 1.7–7.7)
Neutrophils Relative %: 66 %
Platelets: 153 10*3/uL (ref 150–400)
RBC: 3.87 MIL/uL (ref 3.87–5.11)
RDW: 12.6 % (ref 11.5–15.5)
WBC: 6.2 10*3/uL (ref 4.0–10.5)
nRBC: 0 % (ref 0.0–0.2)

## 2023-03-10 LAB — CBC
HCT: 35.9 % — ABNORMAL LOW (ref 36.0–46.0)
Hemoglobin: 11.5 g/dL — ABNORMAL LOW (ref 12.0–15.0)
MCH: 33 pg (ref 26.0–34.0)
MCHC: 32 g/dL (ref 30.0–36.0)
MCV: 103.2 fL — ABNORMAL HIGH (ref 80.0–100.0)
Platelets: 149 10*3/uL — ABNORMAL LOW (ref 150–400)
RBC: 3.48 MIL/uL — ABNORMAL LOW (ref 3.87–5.11)
RDW: 12.7 % (ref 11.5–15.5)
WBC: 6.8 10*3/uL (ref 4.0–10.5)
nRBC: 0 % (ref 0.0–0.2)

## 2023-03-10 LAB — HEMOGLOBIN AND HEMATOCRIT, BLOOD
HCT: 36.3 % (ref 36.0–46.0)
Hemoglobin: 11.6 g/dL — ABNORMAL LOW (ref 12.0–15.0)

## 2023-03-10 LAB — BASIC METABOLIC PANEL
Anion gap: 13 (ref 5–15)
BUN: 12 mg/dL (ref 8–23)
CO2: 26 mmol/L (ref 22–32)
Calcium: 8.9 mg/dL (ref 8.9–10.3)
Chloride: 100 mmol/L (ref 98–111)
Creatinine, Ser: 0.54 mg/dL (ref 0.44–1.00)
GFR, Estimated: >60 mL/min (ref >60)
Glucose, Bld: 117 mg/dL — ABNORMAL HIGH (ref 70–99)
Potassium: 3.6 mmol/L (ref 3.5–5.1)
Sodium: 139 mmol/L (ref 135–145)

## 2023-03-10 MED ORDER — PANTOPRAZOLE SODIUM 20 MG PO TBEC: 20.0000 mg | DELAYED_RELEASE_TABLET | Freq: Two times a day (BID) | ORAL | 0 refills | Status: DC

## 2023-03-10 MED ORDER — TRAMADOL HCL 50 MG PO TABS: 50.0000 mg | ORAL_TABLET | Freq: Once | ORAL | Status: DC

## 2023-03-10 MED ORDER — PANTOPRAZOLE 80MG IVPB - SIMPLE MED
80.0000 mg | Freq: Once | INTRAVENOUS | Status: AC
  Administered 2023-03-10: 80 mg via INTRAVENOUS
  Filled 2023-03-10: qty 100

## 2023-03-10 MED ORDER — PANTOPRAZOLE SODIUM 40 MG IV SOLR: 40.0000 mg | Freq: Two times a day (BID) | INTRAVENOUS | Status: DC

## 2023-03-10 MED ORDER — PANTOPRAZOLE INFUSION (NEW) - SIMPLE MED
8.0000 mg/h | INTRAVENOUS | Status: DC
  Administered 2023-03-10 (×2): 8 mg/h via INTRAVENOUS
  Filled 2023-03-10 (×2): qty 100

## 2023-03-10 MED ORDER — ONDANSETRON HCL 4 MG/2ML IJ SOLN
4.0000 mg | Freq: Once | INTRAMUSCULAR | Status: AC
  Administered 2023-03-10: 4 mg via INTRAVENOUS
  Filled 2023-03-10: qty 2

## 2023-03-10 MED ORDER — FUROSEMIDE 10 MG/ML IJ SOLN: 40.0000 mg | Freq: Once | INTRAMUSCULAR | Status: DC

## 2023-03-10 MED ORDER — FENTANYL CITRATE PF 50 MCG/ML IJ SOSY
50.0000 ug | PREFILLED_SYRINGE | Freq: Once | INTRAMUSCULAR | Status: AC
  Administered 2023-03-10: 50 ug via INTRAVENOUS
  Filled 2023-03-10: qty 1

## 2023-03-10 NOTE — ED Notes (Signed)
Admitting MD at BS.  

## 2023-03-10 NOTE — ED Notes (Signed)
Patient is resting comfortably. 

## 2023-03-10 NOTE — ED Notes (Signed)
Pt has had no more incidents of emesis.

## 2023-03-10 NOTE — ED Notes (Signed)
Ptar called pt is next in line to be picked up.

## 2023-03-10 NOTE — Discharge Instructions (Addendum)
Call gastroenterology for follow-up.
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
## 2023-03-10 NOTE — ED Notes (Signed)
Lab re-contacted re: CBC. Getting it now and running.

## 2023-03-10 NOTE — ED Notes (Addendum)
CBC added to 8am lavender tube of previous H&H per hospitalist Dr. Ophelia Charter, in process, pending results.

## 2023-03-10 NOTE — ED Notes (Signed)
Pt's son [name] updated via phone, [phone]. Pain med given

## 2023-03-10 NOTE — ED Notes (Addendum)
Resting comfortably. R eye irrigated with saline per request, "feels better". H&H sent. Resting comfortably. Mentions chronic R hip pain. Alert, NAD, calm, interactive. Denies NV at this time. Protonix infusing.
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
## 2023-03-10 NOTE — ED Triage Notes (Addendum)
Pt on eliquis with coffee ground emesis starting at 8 pm. Pt coming from [place] where she has been for 4 mos for rehab to regain strength after hospitalization for edema in legs that left her weak. has hx of reflux. Alert and oriented.  Pt states last time she could walk was prob 6 mos ago. She is not able to walk or stand at this time. She is feeding herself. CBG 120
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
## 2023-03-10 NOTE — ED Notes (Signed)
EDP at BS 

## 2023-03-10 NOTE — ED Provider Notes (Signed)
[place] EMERGENCY DEPARTMENT AT [facility] Provider Note   CSN: [id number] Arrival date & time: 03/10/23  7829     History  Chief Complaint  Patient presents with   Hematemesis    Lauren Santana is a 72 y.o. female.  The history is provided by the patient.  She has history of hypertension, prediabetes, hyperlipidemia, pulmonary embolism anticoagulated on apixaban and was sent here from skilled nursing facility where she is reported to have had coffee-ground emesis.  Patient states that she vomited some brown material but did not know it was blood.  She does endorse some epigastric pain.  She still is complaining of some nausea.   Home Medications Prior to Admission medications   Medication Sig Start Date End Date Taking? Authorizing Provider  acetaminophen (TYLENOL) 500 MG tablet Take 500 mg by mouth 2 (two) times daily as needed for moderate pain.    [provider]  apixaban (ELIQUIS) 5 MG TABS tablet Take 5 mg by mouth 2 (two) times daily.    [provider]  atorvastatin (LIPITOR) 10 MG tablet Take 10 mg by mouth daily. 09/08/22   [provider]  [employer] VITAMIN B12 1000 MCG tablet Take 1,000 mcg by mouth daily. Patient not taking: Reported on 02/19/2023 07/11/22   [provider]  diclofenac Sodium (VOLTAREN) 1 % GEL Apply 2 g topically 4 (four) times daily as needed (pain in both knees.). 08/11/20   [provider]  furosemide (LASIX) 20 MG tablet Take 20 mg by mouth daily. 08/20/22   [provider]  gabapentin (NEURONTIN) 300 MG capsule Take 1 capsule (300 mg total) by mouth 3 (three) times daily. Patient not taking: Reported on 02/19/2023 08/21/20   Rhetta Mura, MD  gabapentin (NEURONTIN) 400 MG capsule Take 400 mg by mouth 3 (three) times daily.    [provider]  labetalol (NORMODYNE) 100 MG tablet Take 100 mg by mouth 2 (two) times daily. 09/10/22   [provider]  lidocaine  (LIDODERM) 5 % Place 1 patch onto the skin daily. Remove & Discard patch within 12 hours or as directed by MD Patient taking differently: Place 1 patch onto the skin daily. Right knee. Remove & Discard patch within 12 hours or as directed by MD 11/01/22   Henderly, Britni A, PA-C  mupirocin ointment (BACTROBAN) 2 % APPLY TO AFFECTED AREA TWICE A DAY Patient not taking: Reported on 02/19/2023 07/12/21   Vivi Barrack, DPM  polyethylene glycol (MIRALAX MIX-IN PAX) 17 g packet Take 17 g by mouth daily.    [provider]  potassium chloride 20 MEQ/15ML (10%) SOLN Take 20 mEq by mouth daily. 08/19/22   [provider]  rivaroxaban (XARELTO) 20 MG TABS tablet Take 1 tablet (20 mg total) by mouth daily with supper. Patient not taking: Reported on 02/19/2023 08/23/20   Almon Hercules, MD  [facility]) [phone]" PADS Apply 1 application  topically daily. 09/09/22   Cristopher Peru, PA-C  sucralfate (CARAFATE) 1 GM/10ML suspension Take 1 g by mouth 2 (two) times daily. 09/07/22   [provider]  traMADol (ULTRAM) 50 MG tablet Take 1 tablet (50 mg total) by mouth every 8 (eight) hours. Patient not taking: Reported on 02/19/2023 11/01/22   Henderly, Britni A, PA-C  traMADol HCl 100 MG TABS Take 100 mg by mouth every 4 (four) hours as needed (pain).    [provider]  vitamin B-12 (CYANOCOBALAMIN) 500 MCG tablet Take 500  mcg by mouth daily.    [provider]      Allergies    Tylenol [acetaminophen] and Sulfa antibiotics    Review of Systems   Review of Systems  All other systems reviewed and are negative.   Physical Exam Updated Vital Signs BP 127/66 (BP Location: Right Arm)   Pulse 84   Temp 99.2 F (37.3 C) (Oral)   Resp 15   LMP  (LMP Unknown)   SpO2 95%  Physical Exam Vitals and nursing note reviewed.   72 year old female, resting comfortably and in no acute distress. Vital signs are normal. Oxygen saturation is 95%, which is normal. Head  is normocephalic and atraumatic. PERRLA, EOMI. Oropharynx is clear.  Conjunctivae are pink. Neck is nontender and supple without adenopathy or JVD. Back is nontender and there is no CVA tenderness. Lungs are clear without rales, wheezes, or rhonchi. Chest is nontender. Heart has regular rate and rhythm without murmur. Abdomen is soft, flat, with mild epigastric tenderness.  There is no rebound or guarding. Extremities have no cyanosis or edema. Skin is warm and dry without rash. Neurologic: Mental status is normal, cranial nerves are intact.  She has no voluntary movement of her left arm, markedly decreased strength in both legs.  ED Results / Procedures / Treatments   Labs (all labs ordered are listed, but only abnormal results are displayed) Labs Reviewed  CBC WITH DIFFERENTIAL/PLATELET  BASIC METABOLIC PANEL  TYPE AND SCREEN    EKG None  Radiology No results found.  Procedures Procedures  Cardiac monitor shows normal sinus rhythm with occasional PVC, per my interpretation.  Medications Ordered in ED Medications  ondansetron (ZOFRAN) injection 4 mg (has no administration in time range)  pantoprazole (PROTONIX) 80 mg /NS 100 mL IVPB (has no administration in time range)  pantoprozole (PROTONIX) 80 mg /NS 100 mL infusion (has no administration in time range)  pantoprazole (PROTONIX) injection 40 mg (has no administration in time range)    ED Course/ Medical Decision Making/ A&P                             Medical Decision Making Amount and/or Complexity of Data Reviewed Labs: ordered.  Risk Prescription drug management.   Upper gastrointestinal bleed and patient anticoagulated on apixaban.  Currently hemodynamically stable, doubt hemodynamically significant bleeding.  I do not see evidence of active ongoing bleeding, so not currently a candidate for rapid reversal of anticoagulation.  I have ordered pantoprazole bolus and drip and I have ordered ondansetron for  nausea.  I have ordered laboratory workup of CBC, basic metabolic panel, type and screen.  Last hemoglobin on record was 12.3 on 11/09/2022.  I have reviewed and interpreted her laboratory test, and my interpretation is hemoglobin unchanged from prior, macrocytosis which had been present previously, elevated random glucose.  No elevation of BUN to suggest significant upper GI bleed.  She continues to be hemodynamically stable.  At this point, I do not feel that she had significant GI bleed.  I will hold her in the emergency department to repeat hemoglobin.  If stable, she would be safe to return to her skilled nursing facility on a proton pump inhibitor.  7:07 AM Patient has been hemodynamically stable in the emergency department, repeat hemoglobin pending.  Case is signed out to Dr. Rubin Payor.  If hemoglobin does not show any significant drop, plan is to discharge her back to her skilled  nursing facility but with prescriptions for ondansetron for nausea and daily pantoprazole.  Final Clinical Impression(s) / ED Diagnoses Final diagnoses:  Hematemesis with nausea  Elevated random blood glucose level  Macrocytosis without anemia    Rx / DC Orders ED Discharge Orders     None         Alanson Hausmann, Dione Booze 03/10/23 [id number]

## 2023-03-10 NOTE — Progress Notes (Signed)
Patient requested prayer. Prayer was done.  Also provided emotional support and listening.  Chaplain available as needed.  Venida Jarvis, [place], [facility], Pager [phone]

## 2023-03-11 DIAGNOSIS — M6281 Muscle weakness (generalized): Secondary | ICD-10-CM | POA: Diagnosis not present

## 2023-03-11 DIAGNOSIS — K920 Hematemesis: Secondary | ICD-10-CM | POA: Diagnosis not present

## 2023-03-11 DIAGNOSIS — I10 Essential (primary) hypertension: Secondary | ICD-10-CM | POA: Diagnosis not present

## 2023-03-11 DIAGNOSIS — R293 Abnormal posture: Secondary | ICD-10-CM | POA: Diagnosis not present

## 2023-03-11 DIAGNOSIS — R2681 Unsteadiness on feet: Secondary | ICD-10-CM | POA: Diagnosis not present

## 2023-03-11 DIAGNOSIS — M19011 Primary osteoarthritis, right shoulder: Secondary | ICD-10-CM | POA: Diagnosis not present

## 2023-03-11 DIAGNOSIS — R278 Other lack of coordination: Secondary | ICD-10-CM | POA: Diagnosis not present

## 2023-03-11 DIAGNOSIS — G629 Polyneuropathy, unspecified: Secondary | ICD-10-CM | POA: Diagnosis not present

## 2023-03-11 DIAGNOSIS — R600 Localized edema: Secondary | ICD-10-CM | POA: Diagnosis not present

## 2023-03-11 DIAGNOSIS — I482 Chronic atrial fibrillation, unspecified: Secondary | ICD-10-CM | POA: Diagnosis not present

## 2023-03-11 DIAGNOSIS — M79605 Pain in left leg: Secondary | ICD-10-CM | POA: Diagnosis not present

## 2023-03-11 DIAGNOSIS — R262 Difficulty in walking, not elsewhere classified: Secondary | ICD-10-CM | POA: Diagnosis not present

## 2023-03-11 DIAGNOSIS — I890 Lymphedema, not elsewhere classified: Secondary | ICD-10-CM | POA: Diagnosis not present

## 2023-03-11 DIAGNOSIS — M79604 Pain in right leg: Secondary | ICD-10-CM | POA: Diagnosis not present

## 2023-03-12 DIAGNOSIS — R2681 Unsteadiness on feet: Secondary | ICD-10-CM | POA: Diagnosis not present

## 2023-03-12 DIAGNOSIS — M19011 Primary osteoarthritis, right shoulder: Secondary | ICD-10-CM | POA: Diagnosis not present

## 2023-03-12 DIAGNOSIS — M79605 Pain in left leg: Secondary | ICD-10-CM | POA: Diagnosis not present

## 2023-03-12 DIAGNOSIS — R262 Difficulty in walking, not elsewhere classified: Secondary | ICD-10-CM | POA: Diagnosis not present

## 2023-03-12 DIAGNOSIS — M6281 Muscle weakness (generalized): Secondary | ICD-10-CM | POA: Diagnosis not present

## 2023-03-12 DIAGNOSIS — R293 Abnormal posture: Secondary | ICD-10-CM | POA: Diagnosis not present

## 2023-03-12 DIAGNOSIS — R278 Other lack of coordination: Secondary | ICD-10-CM | POA: Diagnosis not present

## 2023-03-12 DIAGNOSIS — I890 Lymphedema, not elsewhere classified: Secondary | ICD-10-CM | POA: Diagnosis not present

## 2023-03-12 DIAGNOSIS — M79604 Pain in right leg: Secondary | ICD-10-CM | POA: Diagnosis not present

## 2023-03-13 ENCOUNTER — Other Ambulatory Visit (HOSPITAL_COMMUNITY): Payer: Self-pay | Admitting: Internal Medicine

## 2023-03-13 DIAGNOSIS — R262 Difficulty in walking, not elsewhere classified: Secondary | ICD-10-CM | POA: Diagnosis not present

## 2023-03-13 DIAGNOSIS — M79605 Pain in left leg: Secondary | ICD-10-CM | POA: Diagnosis not present

## 2023-03-13 DIAGNOSIS — M5412 Radiculopathy, cervical region: Secondary | ICD-10-CM

## 2023-03-13 DIAGNOSIS — M19011 Primary osteoarthritis, right shoulder: Secondary | ICD-10-CM | POA: Diagnosis not present

## 2023-03-13 DIAGNOSIS — I890 Lymphedema, not elsewhere classified: Secondary | ICD-10-CM | POA: Diagnosis not present

## 2023-03-13 DIAGNOSIS — M6281 Muscle weakness (generalized): Secondary | ICD-10-CM | POA: Diagnosis not present

## 2023-03-13 DIAGNOSIS — R52 Pain, unspecified: Secondary | ICD-10-CM

## 2023-03-13 DIAGNOSIS — R2681 Unsteadiness on feet: Secondary | ICD-10-CM | POA: Diagnosis not present

## 2023-03-13 DIAGNOSIS — R278 Other lack of coordination: Secondary | ICD-10-CM | POA: Diagnosis not present

## 2023-03-13 DIAGNOSIS — M79604 Pain in right leg: Secondary | ICD-10-CM | POA: Diagnosis not present

## 2023-03-13 DIAGNOSIS — R293 Abnormal posture: Secondary | ICD-10-CM | POA: Diagnosis not present

## 2023-03-16 DIAGNOSIS — R293 Abnormal posture: Secondary | ICD-10-CM | POA: Diagnosis not present

## 2023-03-16 DIAGNOSIS — M79605 Pain in left leg: Secondary | ICD-10-CM | POA: Diagnosis not present

## 2023-03-16 DIAGNOSIS — R2681 Unsteadiness on feet: Secondary | ICD-10-CM | POA: Diagnosis not present

## 2023-03-16 DIAGNOSIS — R278 Other lack of coordination: Secondary | ICD-10-CM | POA: Diagnosis not present

## 2023-03-16 DIAGNOSIS — I890 Lymphedema, not elsewhere classified: Secondary | ICD-10-CM | POA: Diagnosis not present

## 2023-03-16 DIAGNOSIS — M79604 Pain in right leg: Secondary | ICD-10-CM | POA: Diagnosis not present

## 2023-03-16 DIAGNOSIS — M6281 Muscle weakness (generalized): Secondary | ICD-10-CM | POA: Diagnosis not present

## 2023-03-16 DIAGNOSIS — R262 Difficulty in walking, not elsewhere classified: Secondary | ICD-10-CM | POA: Diagnosis not present

## 2023-03-16 DIAGNOSIS — M19011 Primary osteoarthritis, right shoulder: Secondary | ICD-10-CM | POA: Diagnosis not present

## 2023-03-17 DIAGNOSIS — M19011 Primary osteoarthritis, right shoulder: Secondary | ICD-10-CM | POA: Diagnosis not present

## 2023-03-17 DIAGNOSIS — R2681 Unsteadiness on feet: Secondary | ICD-10-CM | POA: Diagnosis not present

## 2023-03-17 DIAGNOSIS — R293 Abnormal posture: Secondary | ICD-10-CM | POA: Diagnosis not present

## 2023-03-17 DIAGNOSIS — I890 Lymphedema, not elsewhere classified: Secondary | ICD-10-CM | POA: Diagnosis not present

## 2023-03-17 DIAGNOSIS — M79605 Pain in left leg: Secondary | ICD-10-CM | POA: Diagnosis not present

## 2023-03-17 DIAGNOSIS — M6281 Muscle weakness (generalized): Secondary | ICD-10-CM | POA: Diagnosis not present

## 2023-03-17 DIAGNOSIS — R278 Other lack of coordination: Secondary | ICD-10-CM | POA: Diagnosis not present

## 2023-03-17 DIAGNOSIS — R262 Difficulty in walking, not elsewhere classified: Secondary | ICD-10-CM | POA: Diagnosis not present

## 2023-03-17 DIAGNOSIS — M79604 Pain in right leg: Secondary | ICD-10-CM | POA: Diagnosis not present

## 2023-03-18 DIAGNOSIS — R293 Abnormal posture: Secondary | ICD-10-CM | POA: Diagnosis not present

## 2023-03-18 DIAGNOSIS — M79605 Pain in left leg: Secondary | ICD-10-CM | POA: Diagnosis not present

## 2023-03-18 DIAGNOSIS — M6281 Muscle weakness (generalized): Secondary | ICD-10-CM | POA: Diagnosis not present

## 2023-03-18 DIAGNOSIS — R278 Other lack of coordination: Secondary | ICD-10-CM | POA: Diagnosis not present

## 2023-03-18 DIAGNOSIS — R2681 Unsteadiness on feet: Secondary | ICD-10-CM | POA: Diagnosis not present

## 2023-03-18 DIAGNOSIS — M79604 Pain in right leg: Secondary | ICD-10-CM | POA: Diagnosis not present

## 2023-03-18 DIAGNOSIS — R262 Difficulty in walking, not elsewhere classified: Secondary | ICD-10-CM | POA: Diagnosis not present

## 2023-03-18 DIAGNOSIS — I890 Lymphedema, not elsewhere classified: Secondary | ICD-10-CM | POA: Diagnosis not present

## 2023-03-18 DIAGNOSIS — M19011 Primary osteoarthritis, right shoulder: Secondary | ICD-10-CM | POA: Diagnosis not present

## 2023-03-19 DIAGNOSIS — M79605 Pain in left leg: Secondary | ICD-10-CM | POA: Diagnosis not present

## 2023-03-19 DIAGNOSIS — R262 Difficulty in walking, not elsewhere classified: Secondary | ICD-10-CM | POA: Diagnosis not present

## 2023-03-19 DIAGNOSIS — M6281 Muscle weakness (generalized): Secondary | ICD-10-CM | POA: Diagnosis not present

## 2023-03-19 DIAGNOSIS — R278 Other lack of coordination: Secondary | ICD-10-CM | POA: Diagnosis not present

## 2023-03-19 DIAGNOSIS — I890 Lymphedema, not elsewhere classified: Secondary | ICD-10-CM | POA: Diagnosis not present

## 2023-03-19 DIAGNOSIS — R2681 Unsteadiness on feet: Secondary | ICD-10-CM | POA: Diagnosis not present

## 2023-03-19 DIAGNOSIS — M79604 Pain in right leg: Secondary | ICD-10-CM | POA: Diagnosis not present

## 2023-03-19 DIAGNOSIS — R293 Abnormal posture: Secondary | ICD-10-CM | POA: Diagnosis not present

## 2023-03-19 DIAGNOSIS — M19011 Primary osteoarthritis, right shoulder: Secondary | ICD-10-CM | POA: Diagnosis not present

## 2023-03-20 DIAGNOSIS — R293 Abnormal posture: Secondary | ICD-10-CM | POA: Diagnosis not present

## 2023-03-20 DIAGNOSIS — R262 Difficulty in walking, not elsewhere classified: Secondary | ICD-10-CM | POA: Diagnosis not present

## 2023-03-20 DIAGNOSIS — M19011 Primary osteoarthritis, right shoulder: Secondary | ICD-10-CM | POA: Diagnosis not present

## 2023-03-20 DIAGNOSIS — M79605 Pain in left leg: Secondary | ICD-10-CM | POA: Diagnosis not present

## 2023-03-20 DIAGNOSIS — R2681 Unsteadiness on feet: Secondary | ICD-10-CM | POA: Diagnosis not present

## 2023-03-20 DIAGNOSIS — M79604 Pain in right leg: Secondary | ICD-10-CM | POA: Diagnosis not present

## 2023-03-20 DIAGNOSIS — I890 Lymphedema, not elsewhere classified: Secondary | ICD-10-CM | POA: Diagnosis not present

## 2023-03-20 DIAGNOSIS — M6281 Muscle weakness (generalized): Secondary | ICD-10-CM | POA: Diagnosis not present

## 2023-03-20 DIAGNOSIS — R278 Other lack of coordination: Secondary | ICD-10-CM | POA: Diagnosis not present

## 2023-03-21 ENCOUNTER — Other Ambulatory Visit (HOSPITAL_COMMUNITY): Payer: Self-pay | Admitting: Internal Medicine

## 2023-03-21 ENCOUNTER — Ambulatory Visit (HOSPITAL_COMMUNITY)
Admission: RE | Admit: 2023-03-21 | Discharge: 2023-03-21 | Disposition: A | Discharge status: Home / Self Care | Payer: 59 | Source: Ambulatory Visit | Attending: Internal Medicine | Admitting: Internal Medicine

## 2023-03-21 DIAGNOSIS — M5412 Radiculopathy, cervical region: Secondary | ICD-10-CM

## 2023-03-21 DIAGNOSIS — R52 Pain, unspecified: Secondary | ICD-10-CM | POA: Diagnosis not present

## 2023-03-21 DIAGNOSIS — M19011 Primary osteoarthritis, right shoulder: Secondary | ICD-10-CM

## 2023-03-21 DIAGNOSIS — M75121 Complete rotator cuff tear or rupture of right shoulder, not specified as traumatic: Secondary | ICD-10-CM | POA: Diagnosis not present

## 2023-03-21 DIAGNOSIS — M67813 Other specified disorders of tendon, right shoulder: Secondary | ICD-10-CM | POA: Diagnosis not present

## 2023-03-21 DIAGNOSIS — M542 Cervicalgia: Secondary | ICD-10-CM | POA: Diagnosis not present

## 2023-03-21 DIAGNOSIS — M19012 Primary osteoarthritis, left shoulder: Secondary | ICD-10-CM | POA: Diagnosis not present

## 2023-03-21 DIAGNOSIS — M75111 Incomplete rotator cuff tear or rupture of right shoulder, not specified as traumatic: Secondary | ICD-10-CM | POA: Diagnosis not present

## 2023-03-23 DIAGNOSIS — M79604 Pain in right leg: Secondary | ICD-10-CM | POA: Diagnosis not present

## 2023-03-23 DIAGNOSIS — M79605 Pain in left leg: Secondary | ICD-10-CM | POA: Diagnosis not present

## 2023-03-23 DIAGNOSIS — R262 Difficulty in walking, not elsewhere classified: Secondary | ICD-10-CM | POA: Diagnosis not present

## 2023-03-23 DIAGNOSIS — I890 Lymphedema, not elsewhere classified: Secondary | ICD-10-CM | POA: Diagnosis not present

## 2023-03-23 DIAGNOSIS — M19011 Primary osteoarthritis, right shoulder: Secondary | ICD-10-CM | POA: Diagnosis not present

## 2023-03-23 DIAGNOSIS — R278 Other lack of coordination: Secondary | ICD-10-CM | POA: Diagnosis not present

## 2023-03-23 DIAGNOSIS — R293 Abnormal posture: Secondary | ICD-10-CM | POA: Diagnosis not present

## 2023-03-23 DIAGNOSIS — M6281 Muscle weakness (generalized): Secondary | ICD-10-CM | POA: Diagnosis not present

## 2023-03-23 DIAGNOSIS — R2681 Unsteadiness on feet: Secondary | ICD-10-CM | POA: Diagnosis not present

## 2023-03-24 DIAGNOSIS — R2681 Unsteadiness on feet: Secondary | ICD-10-CM | POA: Diagnosis not present

## 2023-03-24 DIAGNOSIS — R278 Other lack of coordination: Secondary | ICD-10-CM | POA: Diagnosis not present

## 2023-03-24 DIAGNOSIS — M79604 Pain in right leg: Secondary | ICD-10-CM | POA: Diagnosis not present

## 2023-03-24 DIAGNOSIS — I890 Lymphedema, not elsewhere classified: Secondary | ICD-10-CM | POA: Diagnosis not present

## 2023-03-24 DIAGNOSIS — M19011 Primary osteoarthritis, right shoulder: Secondary | ICD-10-CM | POA: Diagnosis not present

## 2023-03-24 DIAGNOSIS — M79605 Pain in left leg: Secondary | ICD-10-CM | POA: Diagnosis not present

## 2023-03-24 DIAGNOSIS — R262 Difficulty in walking, not elsewhere classified: Secondary | ICD-10-CM | POA: Diagnosis not present

## 2023-03-24 DIAGNOSIS — M6281 Muscle weakness (generalized): Secondary | ICD-10-CM | POA: Diagnosis not present

## 2023-03-24 DIAGNOSIS — R293 Abnormal posture: Secondary | ICD-10-CM | POA: Diagnosis not present

## 2023-03-25 DIAGNOSIS — G9520 Unspecified cord compression: Secondary | ICD-10-CM | POA: Diagnosis not present

## 2023-03-25 DIAGNOSIS — M79604 Pain in right leg: Secondary | ICD-10-CM | POA: Diagnosis not present

## 2023-03-25 DIAGNOSIS — R2681 Unsteadiness on feet: Secondary | ICD-10-CM | POA: Diagnosis not present

## 2023-03-25 DIAGNOSIS — I890 Lymphedema, not elsewhere classified: Secondary | ICD-10-CM | POA: Diagnosis not present

## 2023-03-25 DIAGNOSIS — M19011 Primary osteoarthritis, right shoulder: Secondary | ICD-10-CM | POA: Diagnosis not present

## 2023-03-25 DIAGNOSIS — I10 Essential (primary) hypertension: Secondary | ICD-10-CM | POA: Diagnosis not present

## 2023-03-25 DIAGNOSIS — R278 Other lack of coordination: Secondary | ICD-10-CM | POA: Diagnosis not present

## 2023-03-25 DIAGNOSIS — G894 Chronic pain syndrome: Secondary | ICD-10-CM | POA: Diagnosis not present

## 2023-03-25 DIAGNOSIS — R262 Difficulty in walking, not elsewhere classified: Secondary | ICD-10-CM | POA: Diagnosis not present

## 2023-03-25 DIAGNOSIS — M79605 Pain in left leg: Secondary | ICD-10-CM | POA: Diagnosis not present

## 2023-03-25 DIAGNOSIS — R293 Abnormal posture: Secondary | ICD-10-CM | POA: Diagnosis not present

## 2023-03-25 DIAGNOSIS — G629 Polyneuropathy, unspecified: Secondary | ICD-10-CM | POA: Diagnosis not present

## 2023-03-25 DIAGNOSIS — I482 Chronic atrial fibrillation, unspecified: Secondary | ICD-10-CM | POA: Diagnosis not present

## 2023-03-25 DIAGNOSIS — M6281 Muscle weakness (generalized): Secondary | ICD-10-CM | POA: Diagnosis not present

## 2023-03-26 DIAGNOSIS — M19011 Primary osteoarthritis, right shoulder: Secondary | ICD-10-CM | POA: Diagnosis not present

## 2023-03-26 DIAGNOSIS — R293 Abnormal posture: Secondary | ICD-10-CM | POA: Diagnosis not present

## 2023-03-26 DIAGNOSIS — M79605 Pain in left leg: Secondary | ICD-10-CM | POA: Diagnosis not present

## 2023-03-26 DIAGNOSIS — M79604 Pain in right leg: Secondary | ICD-10-CM | POA: Diagnosis not present

## 2023-03-26 DIAGNOSIS — I890 Lymphedema, not elsewhere classified: Secondary | ICD-10-CM | POA: Diagnosis not present

## 2023-03-26 DIAGNOSIS — M6281 Muscle weakness (generalized): Secondary | ICD-10-CM | POA: Diagnosis not present

## 2023-03-26 DIAGNOSIS — R262 Difficulty in walking, not elsewhere classified: Secondary | ICD-10-CM | POA: Diagnosis not present

## 2023-03-26 DIAGNOSIS — R2681 Unsteadiness on feet: Secondary | ICD-10-CM | POA: Diagnosis not present

## 2023-03-26 DIAGNOSIS — R278 Other lack of coordination: Secondary | ICD-10-CM | POA: Diagnosis not present

## 2023-03-27 DIAGNOSIS — M79604 Pain in right leg: Secondary | ICD-10-CM | POA: Diagnosis not present

## 2023-03-27 DIAGNOSIS — M6281 Muscle weakness (generalized): Secondary | ICD-10-CM | POA: Diagnosis not present

## 2023-03-27 DIAGNOSIS — I890 Lymphedema, not elsewhere classified: Secondary | ICD-10-CM | POA: Diagnosis not present

## 2023-03-27 DIAGNOSIS — R278 Other lack of coordination: Secondary | ICD-10-CM | POA: Diagnosis not present

## 2023-03-27 DIAGNOSIS — R293 Abnormal posture: Secondary | ICD-10-CM | POA: Diagnosis not present

## 2023-03-27 DIAGNOSIS — R262 Difficulty in walking, not elsewhere classified: Secondary | ICD-10-CM | POA: Diagnosis not present

## 2023-03-27 DIAGNOSIS — M19011 Primary osteoarthritis, right shoulder: Secondary | ICD-10-CM | POA: Diagnosis not present

## 2023-03-27 DIAGNOSIS — R2681 Unsteadiness on feet: Secondary | ICD-10-CM | POA: Diagnosis not present

## 2023-03-27 DIAGNOSIS — M79605 Pain in left leg: Secondary | ICD-10-CM | POA: Diagnosis not present

## 2023-03-29 DIAGNOSIS — L89152 Pressure ulcer of sacral region, stage 2: Secondary | ICD-10-CM | POA: Diagnosis not present

## 2023-03-30 DIAGNOSIS — M79604 Pain in right leg: Secondary | ICD-10-CM | POA: Diagnosis not present

## 2023-03-30 DIAGNOSIS — M6281 Muscle weakness (generalized): Secondary | ICD-10-CM | POA: Diagnosis not present

## 2023-03-30 DIAGNOSIS — R293 Abnormal posture: Secondary | ICD-10-CM | POA: Diagnosis not present

## 2023-03-30 DIAGNOSIS — R262 Difficulty in walking, not elsewhere classified: Secondary | ICD-10-CM | POA: Diagnosis not present

## 2023-03-30 DIAGNOSIS — M79605 Pain in left leg: Secondary | ICD-10-CM | POA: Diagnosis not present

## 2023-03-30 DIAGNOSIS — M19011 Primary osteoarthritis, right shoulder: Secondary | ICD-10-CM | POA: Diagnosis not present

## 2023-03-30 DIAGNOSIS — I890 Lymphedema, not elsewhere classified: Secondary | ICD-10-CM | POA: Diagnosis not present

## 2023-03-30 DIAGNOSIS — R2681 Unsteadiness on feet: Secondary | ICD-10-CM | POA: Diagnosis not present

## 2023-03-30 DIAGNOSIS — R278 Other lack of coordination: Secondary | ICD-10-CM | POA: Diagnosis not present

## 2023-03-31 DIAGNOSIS — M79604 Pain in right leg: Secondary | ICD-10-CM | POA: Diagnosis not present

## 2023-03-31 DIAGNOSIS — R278 Other lack of coordination: Secondary | ICD-10-CM | POA: Diagnosis not present

## 2023-03-31 DIAGNOSIS — I890 Lymphedema, not elsewhere classified: Secondary | ICD-10-CM | POA: Diagnosis not present

## 2023-03-31 DIAGNOSIS — M79605 Pain in left leg: Secondary | ICD-10-CM | POA: Diagnosis not present

## 2023-03-31 DIAGNOSIS — R262 Difficulty in walking, not elsewhere classified: Secondary | ICD-10-CM | POA: Diagnosis not present

## 2023-03-31 DIAGNOSIS — R2681 Unsteadiness on feet: Secondary | ICD-10-CM | POA: Diagnosis not present

## 2023-03-31 DIAGNOSIS — R293 Abnormal posture: Secondary | ICD-10-CM | POA: Diagnosis not present

## 2023-03-31 DIAGNOSIS — M6281 Muscle weakness (generalized): Secondary | ICD-10-CM | POA: Diagnosis not present

## 2023-03-31 DIAGNOSIS — M19011 Primary osteoarthritis, right shoulder: Secondary | ICD-10-CM | POA: Diagnosis not present

## 2023-04-01 DIAGNOSIS — M19011 Primary osteoarthritis, right shoulder: Secondary | ICD-10-CM | POA: Diagnosis not present

## 2023-04-01 DIAGNOSIS — M6281 Muscle weakness (generalized): Secondary | ICD-10-CM | POA: Diagnosis not present

## 2023-04-01 DIAGNOSIS — R278 Other lack of coordination: Secondary | ICD-10-CM | POA: Diagnosis not present

## 2023-04-01 DIAGNOSIS — M79604 Pain in right leg: Secondary | ICD-10-CM | POA: Diagnosis not present

## 2023-04-01 DIAGNOSIS — R262 Difficulty in walking, not elsewhere classified: Secondary | ICD-10-CM | POA: Diagnosis not present

## 2023-04-01 DIAGNOSIS — G894 Chronic pain syndrome: Secondary | ICD-10-CM | POA: Diagnosis not present

## 2023-04-01 DIAGNOSIS — I890 Lymphedema, not elsewhere classified: Secondary | ICD-10-CM | POA: Diagnosis not present

## 2023-04-01 DIAGNOSIS — G629 Polyneuropathy, unspecified: Secondary | ICD-10-CM | POA: Diagnosis not present

## 2023-04-01 DIAGNOSIS — G9520 Unspecified cord compression: Secondary | ICD-10-CM | POA: Diagnosis not present

## 2023-04-01 DIAGNOSIS — M150 Primary generalized (osteo)arthritis: Secondary | ICD-10-CM | POA: Diagnosis not present

## 2023-04-01 DIAGNOSIS — R2681 Unsteadiness on feet: Secondary | ICD-10-CM | POA: Diagnosis not present

## 2023-04-01 DIAGNOSIS — M79605 Pain in left leg: Secondary | ICD-10-CM | POA: Diagnosis not present

## 2023-04-01 DIAGNOSIS — I10 Essential (primary) hypertension: Secondary | ICD-10-CM | POA: Diagnosis not present

## 2023-04-01 DIAGNOSIS — R293 Abnormal posture: Secondary | ICD-10-CM | POA: Diagnosis not present

## 2023-04-01 DIAGNOSIS — I482 Chronic atrial fibrillation, unspecified: Secondary | ICD-10-CM | POA: Diagnosis not present

## 2023-04-02 DIAGNOSIS — R293 Abnormal posture: Secondary | ICD-10-CM | POA: Diagnosis not present

## 2023-04-02 DIAGNOSIS — M79604 Pain in right leg: Secondary | ICD-10-CM | POA: Diagnosis not present

## 2023-04-02 DIAGNOSIS — R2681 Unsteadiness on feet: Secondary | ICD-10-CM | POA: Diagnosis not present

## 2023-04-02 DIAGNOSIS — M19011 Primary osteoarthritis, right shoulder: Secondary | ICD-10-CM | POA: Diagnosis not present

## 2023-04-02 DIAGNOSIS — R278 Other lack of coordination: Secondary | ICD-10-CM | POA: Diagnosis not present

## 2023-04-02 DIAGNOSIS — M6281 Muscle weakness (generalized): Secondary | ICD-10-CM | POA: Diagnosis not present

## 2023-04-02 DIAGNOSIS — R262 Difficulty in walking, not elsewhere classified: Secondary | ICD-10-CM | POA: Diagnosis not present

## 2023-04-02 DIAGNOSIS — M79605 Pain in left leg: Secondary | ICD-10-CM | POA: Diagnosis not present

## 2023-04-02 DIAGNOSIS — I890 Lymphedema, not elsewhere classified: Secondary | ICD-10-CM | POA: Diagnosis not present

## 2023-04-03 DIAGNOSIS — R262 Difficulty in walking, not elsewhere classified: Secondary | ICD-10-CM | POA: Diagnosis not present

## 2023-04-03 DIAGNOSIS — M79604 Pain in right leg: Secondary | ICD-10-CM | POA: Diagnosis not present

## 2023-04-03 DIAGNOSIS — I890 Lymphedema, not elsewhere classified: Secondary | ICD-10-CM | POA: Diagnosis not present

## 2023-04-03 DIAGNOSIS — M6281 Muscle weakness (generalized): Secondary | ICD-10-CM | POA: Diagnosis not present

## 2023-04-03 DIAGNOSIS — M79605 Pain in left leg: Secondary | ICD-10-CM | POA: Diagnosis not present

## 2023-04-03 DIAGNOSIS — R293 Abnormal posture: Secondary | ICD-10-CM | POA: Diagnosis not present

## 2023-04-03 DIAGNOSIS — M19011 Primary osteoarthritis, right shoulder: Secondary | ICD-10-CM | POA: Diagnosis not present

## 2023-04-03 DIAGNOSIS — R2681 Unsteadiness on feet: Secondary | ICD-10-CM | POA: Diagnosis not present

## 2023-04-03 DIAGNOSIS — R278 Other lack of coordination: Secondary | ICD-10-CM | POA: Diagnosis not present

## 2023-04-06 DIAGNOSIS — I890 Lymphedema, not elsewhere classified: Secondary | ICD-10-CM | POA: Diagnosis not present

## 2023-04-06 DIAGNOSIS — R2681 Unsteadiness on feet: Secondary | ICD-10-CM | POA: Diagnosis not present

## 2023-04-06 DIAGNOSIS — M6281 Muscle weakness (generalized): Secondary | ICD-10-CM | POA: Diagnosis not present

## 2023-04-06 DIAGNOSIS — M79605 Pain in left leg: Secondary | ICD-10-CM | POA: Diagnosis not present

## 2023-04-06 DIAGNOSIS — R262 Difficulty in walking, not elsewhere classified: Secondary | ICD-10-CM | POA: Diagnosis not present

## 2023-04-06 DIAGNOSIS — M79604 Pain in right leg: Secondary | ICD-10-CM | POA: Diagnosis not present

## 2023-04-06 DIAGNOSIS — M19011 Primary osteoarthritis, right shoulder: Secondary | ICD-10-CM | POA: Diagnosis not present

## 2023-04-06 DIAGNOSIS — R278 Other lack of coordination: Secondary | ICD-10-CM | POA: Diagnosis not present

## 2023-04-06 DIAGNOSIS — R293 Abnormal posture: Secondary | ICD-10-CM | POA: Diagnosis not present

## 2023-04-07 DIAGNOSIS — M79604 Pain in right leg: Secondary | ICD-10-CM | POA: Diagnosis not present

## 2023-04-07 DIAGNOSIS — R293 Abnormal posture: Secondary | ICD-10-CM | POA: Diagnosis not present

## 2023-04-07 DIAGNOSIS — I890 Lymphedema, not elsewhere classified: Secondary | ICD-10-CM | POA: Diagnosis not present

## 2023-04-07 DIAGNOSIS — M19011 Primary osteoarthritis, right shoulder: Secondary | ICD-10-CM | POA: Diagnosis not present

## 2023-04-07 DIAGNOSIS — R262 Difficulty in walking, not elsewhere classified: Secondary | ICD-10-CM | POA: Diagnosis not present

## 2023-04-07 DIAGNOSIS — G629 Polyneuropathy, unspecified: Secondary | ICD-10-CM | POA: Diagnosis not present

## 2023-04-07 DIAGNOSIS — M25512 Pain in left shoulder: Secondary | ICD-10-CM | POA: Diagnosis not present

## 2023-04-07 DIAGNOSIS — R278 Other lack of coordination: Secondary | ICD-10-CM | POA: Diagnosis not present

## 2023-04-07 DIAGNOSIS — R2681 Unsteadiness on feet: Secondary | ICD-10-CM | POA: Diagnosis not present

## 2023-04-07 DIAGNOSIS — G894 Chronic pain syndrome: Secondary | ICD-10-CM | POA: Diagnosis not present

## 2023-04-07 DIAGNOSIS — I10 Essential (primary) hypertension: Secondary | ICD-10-CM | POA: Diagnosis not present

## 2023-04-07 DIAGNOSIS — M6281 Muscle weakness (generalized): Secondary | ICD-10-CM | POA: Diagnosis not present

## 2023-04-07 DIAGNOSIS — I482 Chronic atrial fibrillation, unspecified: Secondary | ICD-10-CM | POA: Diagnosis not present

## 2023-04-07 DIAGNOSIS — M79605 Pain in left leg: Secondary | ICD-10-CM | POA: Diagnosis not present

## 2023-04-09 DIAGNOSIS — R2681 Unsteadiness on feet: Secondary | ICD-10-CM | POA: Diagnosis not present

## 2023-04-09 DIAGNOSIS — M6281 Muscle weakness (generalized): Secondary | ICD-10-CM | POA: Diagnosis not present

## 2023-04-09 DIAGNOSIS — M19011 Primary osteoarthritis, right shoulder: Secondary | ICD-10-CM | POA: Diagnosis not present

## 2023-04-09 DIAGNOSIS — R262 Difficulty in walking, not elsewhere classified: Secondary | ICD-10-CM | POA: Diagnosis not present

## 2023-04-09 DIAGNOSIS — R278 Other lack of coordination: Secondary | ICD-10-CM | POA: Diagnosis not present

## 2023-04-09 DIAGNOSIS — I890 Lymphedema, not elsewhere classified: Secondary | ICD-10-CM | POA: Diagnosis not present

## 2023-04-09 DIAGNOSIS — M79604 Pain in right leg: Secondary | ICD-10-CM | POA: Diagnosis not present

## 2023-04-09 DIAGNOSIS — R293 Abnormal posture: Secondary | ICD-10-CM | POA: Diagnosis not present

## 2023-04-09 DIAGNOSIS — M79605 Pain in left leg: Secondary | ICD-10-CM | POA: Diagnosis not present

## 2023-04-10 DIAGNOSIS — I480 Paroxysmal atrial fibrillation: Secondary | ICD-10-CM | POA: Diagnosis not present

## 2023-04-10 DIAGNOSIS — C8510 Unspecified B-cell lymphoma, unspecified site: Secondary | ICD-10-CM | POA: Diagnosis not present

## 2023-04-10 DIAGNOSIS — M79605 Pain in left leg: Secondary | ICD-10-CM | POA: Diagnosis not present

## 2023-04-10 DIAGNOSIS — M6281 Muscle weakness (generalized): Secondary | ICD-10-CM | POA: Diagnosis not present

## 2023-04-10 DIAGNOSIS — I890 Lymphedema, not elsewhere classified: Secondary | ICD-10-CM | POA: Diagnosis not present

## 2023-04-10 DIAGNOSIS — M79604 Pain in right leg: Secondary | ICD-10-CM | POA: Diagnosis not present

## 2023-04-10 DIAGNOSIS — R262 Difficulty in walking, not elsewhere classified: Secondary | ICD-10-CM | POA: Diagnosis not present

## 2023-04-10 DIAGNOSIS — R2681 Unsteadiness on feet: Secondary | ICD-10-CM | POA: Diagnosis not present

## 2023-04-10 DIAGNOSIS — I2699 Other pulmonary embolism without acute cor pulmonale: Secondary | ICD-10-CM | POA: Diagnosis not present

## 2023-04-10 DIAGNOSIS — R531 Weakness: Secondary | ICD-10-CM | POA: Diagnosis not present

## 2023-04-10 DIAGNOSIS — R278 Other lack of coordination: Secondary | ICD-10-CM | POA: Diagnosis not present

## 2023-04-10 DIAGNOSIS — R293 Abnormal posture: Secondary | ICD-10-CM | POA: Diagnosis not present

## 2023-04-10 DIAGNOSIS — M19011 Primary osteoarthritis, right shoulder: Secondary | ICD-10-CM | POA: Diagnosis not present

## 2023-04-12 DIAGNOSIS — M6281 Muscle weakness (generalized): Secondary | ICD-10-CM | POA: Diagnosis not present

## 2023-04-12 DIAGNOSIS — M79604 Pain in right leg: Secondary | ICD-10-CM | POA: Diagnosis not present

## 2023-04-12 DIAGNOSIS — R2681 Unsteadiness on feet: Secondary | ICD-10-CM | POA: Diagnosis not present

## 2023-04-12 DIAGNOSIS — I890 Lymphedema, not elsewhere classified: Secondary | ICD-10-CM | POA: Diagnosis not present

## 2023-04-12 DIAGNOSIS — R293 Abnormal posture: Secondary | ICD-10-CM | POA: Diagnosis not present

## 2023-04-12 DIAGNOSIS — M19011 Primary osteoarthritis, right shoulder: Secondary | ICD-10-CM | POA: Diagnosis not present

## 2023-04-12 DIAGNOSIS — R262 Difficulty in walking, not elsewhere classified: Secondary | ICD-10-CM | POA: Diagnosis not present

## 2023-04-12 DIAGNOSIS — M79605 Pain in left leg: Secondary | ICD-10-CM | POA: Diagnosis not present

## 2023-04-12 DIAGNOSIS — R278 Other lack of coordination: Secondary | ICD-10-CM | POA: Diagnosis not present

## 2023-04-13 DIAGNOSIS — M4302 Spondylolysis, cervical region: Secondary | ICD-10-CM | POA: Diagnosis not present

## 2023-04-13 DIAGNOSIS — G9520 Unspecified cord compression: Secondary | ICD-10-CM | POA: Diagnosis not present

## 2023-04-13 DIAGNOSIS — G8929 Other chronic pain: Secondary | ICD-10-CM | POA: Diagnosis not present

## 2023-04-13 DIAGNOSIS — M5030 Other cervical disc degeneration, unspecified cervical region: Secondary | ICD-10-CM | POA: Diagnosis not present

## 2023-04-14 DIAGNOSIS — M19011 Primary osteoarthritis, right shoulder: Secondary | ICD-10-CM | POA: Diagnosis not present

## 2023-04-14 DIAGNOSIS — I890 Lymphedema, not elsewhere classified: Secondary | ICD-10-CM | POA: Diagnosis not present

## 2023-04-14 DIAGNOSIS — M6281 Muscle weakness (generalized): Secondary | ICD-10-CM | POA: Diagnosis not present

## 2023-04-14 DIAGNOSIS — R293 Abnormal posture: Secondary | ICD-10-CM | POA: Diagnosis not present

## 2023-04-14 DIAGNOSIS — M79604 Pain in right leg: Secondary | ICD-10-CM | POA: Diagnosis not present

## 2023-04-14 DIAGNOSIS — R2681 Unsteadiness on feet: Secondary | ICD-10-CM | POA: Diagnosis not present

## 2023-04-14 DIAGNOSIS — R262 Difficulty in walking, not elsewhere classified: Secondary | ICD-10-CM | POA: Diagnosis not present

## 2023-04-14 DIAGNOSIS — M79605 Pain in left leg: Secondary | ICD-10-CM | POA: Diagnosis not present

## 2023-04-14 DIAGNOSIS — R278 Other lack of coordination: Secondary | ICD-10-CM | POA: Diagnosis not present

## 2023-04-15 ENCOUNTER — Encounter: Payer: Self-pay | Admitting: Physician Assistant

## 2023-04-15 ENCOUNTER — Ambulatory Visit (INDEPENDENT_AMBULATORY_CARE_PROVIDER_SITE_OTHER): Payer: 59 | Admitting: Physician Assistant

## 2023-04-15 DIAGNOSIS — I890 Lymphedema, not elsewhere classified: Secondary | ICD-10-CM | POA: Diagnosis not present

## 2023-04-15 DIAGNOSIS — M19011 Primary osteoarthritis, right shoulder: Secondary | ICD-10-CM | POA: Diagnosis not present

## 2023-04-15 DIAGNOSIS — M47812 Spondylosis without myelopathy or radiculopathy, cervical region: Secondary | ICD-10-CM | POA: Diagnosis not present

## 2023-04-15 DIAGNOSIS — M79604 Pain in right leg: Secondary | ICD-10-CM | POA: Diagnosis not present

## 2023-04-15 DIAGNOSIS — R293 Abnormal posture: Secondary | ICD-10-CM | POA: Diagnosis not present

## 2023-04-15 DIAGNOSIS — M6281 Muscle weakness (generalized): Secondary | ICD-10-CM | POA: Diagnosis not present

## 2023-04-15 DIAGNOSIS — R2681 Unsteadiness on feet: Secondary | ICD-10-CM | POA: Diagnosis not present

## 2023-04-15 DIAGNOSIS — R278 Other lack of coordination: Secondary | ICD-10-CM | POA: Diagnosis not present

## 2023-04-15 DIAGNOSIS — R262 Difficulty in walking, not elsewhere classified: Secondary | ICD-10-CM | POA: Diagnosis not present

## 2023-04-15 DIAGNOSIS — M79605 Pain in left leg: Secondary | ICD-10-CM | POA: Diagnosis not present

## 2023-04-15 NOTE — Progress Notes (Signed)
Office Visit Note   Patient: Lauren Santana           Date of Birth: [birth year]           MRN: [id number] Visit Date: 04/15/2023              Requested by: Renaye Rakers, MD [address] [place],  [place] [address] PCP: Renaye Rakers, MD  Chief Complaint  Patient presents with   Right Shoulder - Pain   Left Shoulder - Pain      HPI: Patient is a 72 year old woman who I have seen twice once in March for evaluation knee pain.  She was status post right knee total arthroplasty and had some pain and giving way in her knee.  She received a steroid injection at that time.  Since that time she began having falls and weakness.  I saw her at the beginning of April for request to evaluate her shoulders.  At that time she had findings consistent with arthritis and rotator cuff tendinopathy.  I recommended physical therapy.  While she saw me that day she expressed her concerns about having generalized weakness in both her upper and lower extremities.  Recommendations at that time was to have her to be that I recommended she would probably need a neurologic evaluation and that this was beyond my expertise.  She had MRIs of her shoulders and her cervical spine ordered by her primary care and was told to follow-up with me for my recommendations she continues to have weakness in her upper arms.  She has poor grip strength and has difficulty lifting her arms.  This has definitely progressed in the last several months.  She said the last time she recalls walking was about a year ago.  Assessment & Plan: Visit Diagnoses:  1. Neck arthritis     Plan: I reviewed the MRI of the neck with Dr. Christell Constant today.  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  With regards to her shoulder she does have advanced glenohumeral arthritis and AC arthritis and advanced degeneration within her rotator cuff.  This is something that could be addressed by simple physical therapy.  I do have significant  concerns about her neck and her decreased function.  We will refer her to Dr. Christell Constant who is aware of her MRI and presentation.  She says the last time she could walk was about a year ago and over the last 4 months to 8 months she has lost progressive use of her arms.  Again when she was referred for shoulder pain this is certainly something I am happy to follow her for.  With regards to her weakness and cervical neck issues this is more appropriate to be followed by a spine surgeon or neurologist  Follow-Up Instructions: Dr. Oleh Genin Exam  Patient is alert, oriented, no adenopathy, well-dressed, normal affect, normal respiratory effort. Patient is sitting comfortably in wheelchair.  She has good forward flexion extension and side-to-side turning of her neck.  She has dependent swelling in both of her arms.  She is neurovascularly intact.  She has minimal to no grip strength.  She has no active elevation of her arms.  Lower extremity strength is quite weak.  Imaging: No results found. No images are attached to the encounter.  Labs: Lab Results  Component Value Date   HGBA1C 4.6 (L) 08/16/2020   HGBA1C 5.1 01/17/2020   ESRSEDRATE 80 (H) 03/14/2019   ESRSEDRATE  21 01/25/2019   ESRSEDRATE 90 (H) 01/03/2019   CRP 40 (H) 03/14/2019   CRP 6 01/03/2019   LABURIC 6.8 02/21/2010   REPTSTATUS 08/20/2020 FINAL 08/17/2020   GRAMSTAIN  08/17/2020    ABUNDANT WBC PRESENT, PREDOMINANTLY PMN RARE GRAM NEGATIVE RODS Performed at [facility], [address] [place], [place] [address]    CULT RARE KLEBSIELLA PNEUMONIAE 08/17/2020   LABORGA KLEBSIELLA PNEUMONIAE 08/17/2020     Lab Results  Component Value Date   ALBUMIN 2.2 (L) 08/23/2020   ALBUMIN 2.2 (L) 08/21/2020   ALBUMIN 2.1 (L) 08/20/2020    Lab Results  Component Value Date   MG 1.7 08/23/2020   MG 1.8 08/19/2020   MG 1.9 07/25/2020   No results found for: "VD25OH"  No results found for: "PREALBUMIN"    Latest Ref  Rng & Units 03/10/2023    7:58 AM 03/10/2023    2:35 AM 11/09/2022    1:14 AM  CBC EXTENDED  WBC 4.0 - 10.5 K/uL 6.8  6.2  4.5   RBC 3.87 - 5.11 MIL/uL 3.48  3.87  3.76   Hemoglobin 12.0 - 15.0 g/dL 60.4 - 54.0 g/dL 98.1    19.1  47.8  29.5   HCT 36.0 - 46.0 % 36.0 - 46.0 % 36.3    35.9  40.7  39.2   Platelets 150 - 400 K/uL 149  153  101   NEUT# 1.7 - 7.7 K/uL  4.1  2.6   Lymph# 0.7 - 4.0 K/uL  1.7  0.9      There is no height or weight on file to calculate BMI.  Orders:  Orders Placed This Encounter  Procedures   Ambulatory referral to Orthopedic Surgery   No orders of the defined types were placed in this encounter.    Procedures: No procedures performed  Clinical Data: No additional findings.  ROS:  All other systems negative, except as noted in the HPI. Review of Systems  Objective: Vital Signs: LMP  (LMP Unknown)   Specialty Comments:  No specialty comments available.  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   S/P BSO (bilateral salpingo-oophorectomy) 01/08/2017   S/P hysterectomy 01/08/2017   Tachycardia    Acute deep vein thrombosis (DVT) of right lower extremity (HCC) 02/14/2016   Pulmonary emboli (HCC) 02/13/2016   PE (pulmonary embolism) 02/13/2016   AKI (acute kidney injury) (HCC) 02/13/2016   Pulmonary embolism with acute cor pulmonale (HCC)    Pelvic mass in female    Obesity (BMI 30-39.9) 08/20/2015    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   Essential hypertension, benign 07/23/2014   Edema 07/23/2014   Dyslipidemia 07/23/2014   Lower extremity numbness 07/23/2014   Arthritis of knee 07/17/2014   Postmenopausal bleeding 03/10/2012   Endometriosis of pelvis 01/22/2007   Past Medical History:  Diagnosis Date   Arthritis    Edema    B/LLE   Endometriosis    High cholesterol  Hypertension    Numbness    feet   Pre-diabetes    Pulmonary embolism (HCC)    recurrent, second episode in 06/2020   Walker as ambulation aid    Wears dentures    Wears glasses     Family History  Problem Relation Age of Onset   Colon cancer Sister    Cancer Sister        unsure of type of cancer    Past Surgical History:  Procedure Laterality Date   ABDOMINAL HYSTERECTOMY     and BSO   CATARACT EXTRACTION Left 10/2018   CESAREAN SECTION  1969, 1971   COLONOSCOPY     EYE SURGERY Bilateral    CATARACT SX   HERNIA REPAIR  2001   incisional   IR CATHETER TUBE CHANGE  10/12/2020   IR CATHETER TUBE CHANGE  12/07/2020   IR CHOLANGIOGRAM EXISTING TUBE  09/28/2020   IR PERC CHOLECYSTOSTOMY  08/17/2020   IR RADIOLOGIST EVAL & MGMT  11/22/2020   JOINT REPLACEMENT Left    Knee   MAXILLARY ANTROSTOMY Right 01/20/2020   Procedure: MAXILLARY ANTROSTOMY  WITH BIOPSY CALDWELL APPROACH;  Surgeon: Osborn Coho, MD;  Location: [facility] OR;  Service: ENT;  Laterality: Right;   MULTIPLE TOOTH EXTRACTIONS  2011   NASAL ENDOSCOPY Right 02/17/2020   Procedure: NASAL ENDOSCOPY WITH RIGHT INFERIOR TURNBINATE REDUCTION;  Surgeon: Osborn Coho, MD;  Location: [facility] OR;  Service: ENT;  Laterality: Right;   neck mass removal     "fatty tissue, not cancerous" per pt's son   SINUS ENDO WITH FUSION Right 01/20/2020   Procedure: SINUS ENDO WITH FUSION WITH BIOSPY;  Surgeon: Osborn Coho, MD;  Location: [facility] OR;  Service: ENT;  Laterality: Right;   TOTAL KNEE ARTHROPLASTY Left 07/17/2014   Procedure: TOTAL KNEE  ARTHROPLASTY;  Surgeon: Nestor Lewandowsky, MD;  Location: [facility] OR;  Service: Orthopedics;  Laterality: Left;   Social History   Occupational History   Not on file  Tobacco Use   Smoking status: Never   Smokeless tobacco: Never  Vaping Use   Vaping Use: Never used  Substance and Sexual Activity   Alcohol use: No   Drug use: No   Sexual activity: Not Currently    Comment: Hysterectoomy

## 2023-04-16 DIAGNOSIS — R293 Abnormal posture: Secondary | ICD-10-CM | POA: Diagnosis not present

## 2023-04-16 DIAGNOSIS — M79604 Pain in right leg: Secondary | ICD-10-CM | POA: Diagnosis not present

## 2023-04-16 DIAGNOSIS — M79605 Pain in left leg: Secondary | ICD-10-CM | POA: Diagnosis not present

## 2023-04-16 DIAGNOSIS — R2681 Unsteadiness on feet: Secondary | ICD-10-CM | POA: Diagnosis not present

## 2023-04-16 DIAGNOSIS — M19011 Primary osteoarthritis, right shoulder: Secondary | ICD-10-CM | POA: Diagnosis not present

## 2023-04-16 DIAGNOSIS — I890 Lymphedema, not elsewhere classified: Secondary | ICD-10-CM | POA: Diagnosis not present

## 2023-04-16 DIAGNOSIS — R278 Other lack of coordination: Secondary | ICD-10-CM | POA: Diagnosis not present

## 2023-04-16 DIAGNOSIS — M6281 Muscle weakness (generalized): Secondary | ICD-10-CM | POA: Diagnosis not present

## 2023-04-16 DIAGNOSIS — R262 Difficulty in walking, not elsewhere classified: Secondary | ICD-10-CM | POA: Diagnosis not present

## 2023-04-18 DIAGNOSIS — R262 Difficulty in walking, not elsewhere classified: Secondary | ICD-10-CM | POA: Diagnosis not present

## 2023-04-18 DIAGNOSIS — R2681 Unsteadiness on feet: Secondary | ICD-10-CM | POA: Diagnosis not present

## 2023-04-18 DIAGNOSIS — M79605 Pain in left leg: Secondary | ICD-10-CM | POA: Diagnosis not present

## 2023-04-18 DIAGNOSIS — M19011 Primary osteoarthritis, right shoulder: Secondary | ICD-10-CM | POA: Diagnosis not present

## 2023-04-18 DIAGNOSIS — G894 Chronic pain syndrome: Secondary | ICD-10-CM | POA: Diagnosis not present

## 2023-04-18 DIAGNOSIS — M6281 Muscle weakness (generalized): Secondary | ICD-10-CM | POA: Diagnosis not present

## 2023-04-18 DIAGNOSIS — M79604 Pain in right leg: Secondary | ICD-10-CM | POA: Diagnosis not present

## 2023-04-18 DIAGNOSIS — C8330 Diffuse large B-cell lymphoma, unspecified site: Secondary | ICD-10-CM | POA: Diagnosis not present

## 2023-04-18 DIAGNOSIS — I890 Lymphedema, not elsewhere classified: Secondary | ICD-10-CM | POA: Diagnosis not present

## 2023-04-18 DIAGNOSIS — I10 Essential (primary) hypertension: Secondary | ICD-10-CM | POA: Diagnosis not present

## 2023-04-18 DIAGNOSIS — G629 Polyneuropathy, unspecified: Secondary | ICD-10-CM | POA: Diagnosis not present

## 2023-04-18 DIAGNOSIS — R278 Other lack of coordination: Secondary | ICD-10-CM | POA: Diagnosis not present

## 2023-04-18 DIAGNOSIS — G9520 Unspecified cord compression: Secondary | ICD-10-CM | POA: Diagnosis not present

## 2023-04-18 DIAGNOSIS — K219 Gastro-esophageal reflux disease without esophagitis: Secondary | ICD-10-CM | POA: Diagnosis not present

## 2023-04-18 DIAGNOSIS — M150 Primary generalized (osteo)arthritis: Secondary | ICD-10-CM | POA: Diagnosis not present

## 2023-04-18 DIAGNOSIS — R293 Abnormal posture: Secondary | ICD-10-CM | POA: Diagnosis not present

## 2023-04-18 DIAGNOSIS — I482 Chronic atrial fibrillation, unspecified: Secondary | ICD-10-CM | POA: Diagnosis not present

## 2023-04-18 DIAGNOSIS — K5900 Constipation, unspecified: Secondary | ICD-10-CM | POA: Diagnosis not present

## 2023-04-19 DIAGNOSIS — I890 Lymphedema, not elsewhere classified: Secondary | ICD-10-CM | POA: Diagnosis not present

## 2023-04-19 DIAGNOSIS — M19011 Primary osteoarthritis, right shoulder: Secondary | ICD-10-CM | POA: Diagnosis not present

## 2023-04-19 DIAGNOSIS — M79605 Pain in left leg: Secondary | ICD-10-CM | POA: Diagnosis not present

## 2023-04-19 DIAGNOSIS — R278 Other lack of coordination: Secondary | ICD-10-CM | POA: Diagnosis not present

## 2023-04-19 DIAGNOSIS — R262 Difficulty in walking, not elsewhere classified: Secondary | ICD-10-CM | POA: Diagnosis not present

## 2023-04-19 DIAGNOSIS — M79604 Pain in right leg: Secondary | ICD-10-CM | POA: Diagnosis not present

## 2023-04-19 DIAGNOSIS — R2681 Unsteadiness on feet: Secondary | ICD-10-CM | POA: Diagnosis not present

## 2023-04-19 DIAGNOSIS — M6281 Muscle weakness (generalized): Secondary | ICD-10-CM | POA: Diagnosis not present

## 2023-04-19 DIAGNOSIS — R293 Abnormal posture: Secondary | ICD-10-CM | POA: Diagnosis not present

## 2023-04-21 DIAGNOSIS — I890 Lymphedema, not elsewhere classified: Secondary | ICD-10-CM | POA: Diagnosis not present

## 2023-04-21 DIAGNOSIS — R293 Abnormal posture: Secondary | ICD-10-CM | POA: Diagnosis not present

## 2023-04-21 DIAGNOSIS — R262 Difficulty in walking, not elsewhere classified: Secondary | ICD-10-CM | POA: Diagnosis not present

## 2023-04-21 DIAGNOSIS — M19011 Primary osteoarthritis, right shoulder: Secondary | ICD-10-CM | POA: Diagnosis not present

## 2023-04-21 DIAGNOSIS — M6281 Muscle weakness (generalized): Secondary | ICD-10-CM | POA: Diagnosis not present

## 2023-04-21 DIAGNOSIS — R2681 Unsteadiness on feet: Secondary | ICD-10-CM | POA: Diagnosis not present

## 2023-04-21 DIAGNOSIS — R278 Other lack of coordination: Secondary | ICD-10-CM | POA: Diagnosis not present

## 2023-04-21 DIAGNOSIS — M79604 Pain in right leg: Secondary | ICD-10-CM | POA: Diagnosis not present

## 2023-04-21 DIAGNOSIS — M79605 Pain in left leg: Secondary | ICD-10-CM | POA: Diagnosis not present

## 2023-04-22 DIAGNOSIS — M79604 Pain in right leg: Secondary | ICD-10-CM | POA: Diagnosis not present

## 2023-04-22 DIAGNOSIS — I890 Lymphedema, not elsewhere classified: Secondary | ICD-10-CM | POA: Diagnosis not present

## 2023-04-22 DIAGNOSIS — R262 Difficulty in walking, not elsewhere classified: Secondary | ICD-10-CM | POA: Diagnosis not present

## 2023-04-22 DIAGNOSIS — R278 Other lack of coordination: Secondary | ICD-10-CM | POA: Diagnosis not present

## 2023-04-22 DIAGNOSIS — R293 Abnormal posture: Secondary | ICD-10-CM | POA: Diagnosis not present

## 2023-04-22 DIAGNOSIS — M6281 Muscle weakness (generalized): Secondary | ICD-10-CM | POA: Diagnosis not present

## 2023-04-22 DIAGNOSIS — R2681 Unsteadiness on feet: Secondary | ICD-10-CM | POA: Diagnosis not present

## 2023-04-22 DIAGNOSIS — M19011 Primary osteoarthritis, right shoulder: Secondary | ICD-10-CM | POA: Diagnosis not present

## 2023-04-22 DIAGNOSIS — M79605 Pain in left leg: Secondary | ICD-10-CM | POA: Diagnosis not present

## 2023-04-23 DIAGNOSIS — M6281 Muscle weakness (generalized): Secondary | ICD-10-CM | POA: Diagnosis not present

## 2023-04-23 DIAGNOSIS — G9520 Unspecified cord compression: Secondary | ICD-10-CM | POA: Diagnosis not present

## 2023-04-23 DIAGNOSIS — M150 Primary generalized (osteo)arthritis: Secondary | ICD-10-CM | POA: Diagnosis not present

## 2023-04-23 DIAGNOSIS — I482 Chronic atrial fibrillation, unspecified: Secondary | ICD-10-CM | POA: Diagnosis not present

## 2023-04-23 DIAGNOSIS — G629 Polyneuropathy, unspecified: Secondary | ICD-10-CM | POA: Diagnosis not present

## 2023-04-23 DIAGNOSIS — R278 Other lack of coordination: Secondary | ICD-10-CM | POA: Diagnosis not present

## 2023-04-23 DIAGNOSIS — I890 Lymphedema, not elsewhere classified: Secondary | ICD-10-CM | POA: Diagnosis not present

## 2023-04-23 DIAGNOSIS — R2681 Unsteadiness on feet: Secondary | ICD-10-CM | POA: Diagnosis not present

## 2023-04-23 DIAGNOSIS — I10 Essential (primary) hypertension: Secondary | ICD-10-CM | POA: Diagnosis not present

## 2023-04-23 DIAGNOSIS — M79605 Pain in left leg: Secondary | ICD-10-CM | POA: Diagnosis not present

## 2023-04-23 DIAGNOSIS — R293 Abnormal posture: Secondary | ICD-10-CM | POA: Diagnosis not present

## 2023-04-23 DIAGNOSIS — M19011 Primary osteoarthritis, right shoulder: Secondary | ICD-10-CM | POA: Diagnosis not present

## 2023-04-23 DIAGNOSIS — R262 Difficulty in walking, not elsewhere classified: Secondary | ICD-10-CM | POA: Diagnosis not present

## 2023-04-23 DIAGNOSIS — G894 Chronic pain syndrome: Secondary | ICD-10-CM | POA: Diagnosis not present

## 2023-04-23 DIAGNOSIS — M79604 Pain in right leg: Secondary | ICD-10-CM | POA: Diagnosis not present

## 2023-04-27 DIAGNOSIS — I890 Lymphedema, not elsewhere classified: Secondary | ICD-10-CM | POA: Diagnosis not present

## 2023-04-27 DIAGNOSIS — R293 Abnormal posture: Secondary | ICD-10-CM | POA: Diagnosis not present

## 2023-04-27 DIAGNOSIS — R278 Other lack of coordination: Secondary | ICD-10-CM | POA: Diagnosis not present

## 2023-04-27 DIAGNOSIS — R262 Difficulty in walking, not elsewhere classified: Secondary | ICD-10-CM | POA: Diagnosis not present

## 2023-04-27 DIAGNOSIS — R2681 Unsteadiness on feet: Secondary | ICD-10-CM | POA: Diagnosis not present

## 2023-04-27 DIAGNOSIS — C8330 Diffuse large B-cell lymphoma, unspecified site: Secondary | ICD-10-CM | POA: Diagnosis not present

## 2023-04-27 DIAGNOSIS — M19011 Primary osteoarthritis, right shoulder: Secondary | ICD-10-CM | POA: Diagnosis not present

## 2023-04-28 DIAGNOSIS — I482 Chronic atrial fibrillation, unspecified: Secondary | ICD-10-CM | POA: Diagnosis not present

## 2023-04-28 DIAGNOSIS — G629 Polyneuropathy, unspecified: Secondary | ICD-10-CM | POA: Diagnosis not present

## 2023-04-28 DIAGNOSIS — M150 Primary generalized (osteo)arthritis: Secondary | ICD-10-CM | POA: Diagnosis not present

## 2023-04-28 DIAGNOSIS — I890 Lymphedema, not elsewhere classified: Secondary | ICD-10-CM | POA: Diagnosis not present

## 2023-04-28 DIAGNOSIS — R293 Abnormal posture: Secondary | ICD-10-CM | POA: Diagnosis not present

## 2023-04-28 DIAGNOSIS — R2681 Unsteadiness on feet: Secondary | ICD-10-CM | POA: Diagnosis not present

## 2023-04-28 DIAGNOSIS — G894 Chronic pain syndrome: Secondary | ICD-10-CM | POA: Diagnosis not present

## 2023-04-28 DIAGNOSIS — R262 Difficulty in walking, not elsewhere classified: Secondary | ICD-10-CM | POA: Diagnosis not present

## 2023-04-28 DIAGNOSIS — C8330 Diffuse large B-cell lymphoma, unspecified site: Secondary | ICD-10-CM | POA: Diagnosis not present

## 2023-04-28 DIAGNOSIS — I10 Essential (primary) hypertension: Secondary | ICD-10-CM | POA: Diagnosis not present

## 2023-04-28 DIAGNOSIS — M19011 Primary osteoarthritis, right shoulder: Secondary | ICD-10-CM | POA: Diagnosis not present

## 2023-04-28 DIAGNOSIS — R278 Other lack of coordination: Secondary | ICD-10-CM | POA: Diagnosis not present

## 2023-04-29 DIAGNOSIS — R262 Difficulty in walking, not elsewhere classified: Secondary | ICD-10-CM | POA: Diagnosis not present

## 2023-04-29 DIAGNOSIS — R278 Other lack of coordination: Secondary | ICD-10-CM | POA: Diagnosis not present

## 2023-04-29 DIAGNOSIS — I890 Lymphedema, not elsewhere classified: Secondary | ICD-10-CM | POA: Diagnosis not present

## 2023-04-29 DIAGNOSIS — L89152 Pressure ulcer of sacral region, stage 2: Secondary | ICD-10-CM | POA: Diagnosis not present

## 2023-04-29 DIAGNOSIS — M19011 Primary osteoarthritis, right shoulder: Secondary | ICD-10-CM | POA: Diagnosis not present

## 2023-04-29 DIAGNOSIS — R2681 Unsteadiness on feet: Secondary | ICD-10-CM | POA: Diagnosis not present

## 2023-04-29 DIAGNOSIS — R293 Abnormal posture: Secondary | ICD-10-CM | POA: Diagnosis not present

## 2023-04-29 DIAGNOSIS — C8330 Diffuse large B-cell lymphoma, unspecified site: Secondary | ICD-10-CM | POA: Diagnosis not present

## 2023-04-30 DIAGNOSIS — I890 Lymphedema, not elsewhere classified: Secondary | ICD-10-CM | POA: Diagnosis not present

## 2023-04-30 DIAGNOSIS — S31010A Laceration without foreign body of lower back and pelvis without penetration into retroperitoneum, initial encounter: Secondary | ICD-10-CM | POA: Diagnosis not present

## 2023-04-30 DIAGNOSIS — C8330 Diffuse large B-cell lymphoma, unspecified site: Secondary | ICD-10-CM | POA: Diagnosis not present

## 2023-04-30 DIAGNOSIS — R2681 Unsteadiness on feet: Secondary | ICD-10-CM | POA: Diagnosis not present

## 2023-04-30 DIAGNOSIS — S31821A Laceration without foreign body of left buttock, initial encounter: Secondary | ICD-10-CM | POA: Diagnosis not present

## 2023-04-30 DIAGNOSIS — M19011 Primary osteoarthritis, right shoulder: Secondary | ICD-10-CM | POA: Diagnosis not present

## 2023-04-30 DIAGNOSIS — R262 Difficulty in walking, not elsewhere classified: Secondary | ICD-10-CM | POA: Diagnosis not present

## 2023-04-30 DIAGNOSIS — R278 Other lack of coordination: Secondary | ICD-10-CM | POA: Diagnosis not present

## 2023-04-30 DIAGNOSIS — R293 Abnormal posture: Secondary | ICD-10-CM | POA: Diagnosis not present

## 2023-05-01 DIAGNOSIS — I890 Lymphedema, not elsewhere classified: Secondary | ICD-10-CM | POA: Diagnosis not present

## 2023-05-01 DIAGNOSIS — M4712 Other spondylosis with myelopathy, cervical region: Secondary | ICD-10-CM | POA: Diagnosis not present

## 2023-05-01 DIAGNOSIS — C8330 Diffuse large B-cell lymphoma, unspecified site: Secondary | ICD-10-CM | POA: Diagnosis not present

## 2023-05-01 DIAGNOSIS — R293 Abnormal posture: Secondary | ICD-10-CM | POA: Diagnosis not present

## 2023-05-01 DIAGNOSIS — M542 Cervicalgia: Secondary | ICD-10-CM | POA: Diagnosis not present

## 2023-05-01 DIAGNOSIS — G8929 Other chronic pain: Secondary | ICD-10-CM | POA: Diagnosis not present

## 2023-05-01 DIAGNOSIS — R262 Difficulty in walking, not elsewhere classified: Secondary | ICD-10-CM | POA: Diagnosis not present

## 2023-05-01 DIAGNOSIS — R2681 Unsteadiness on feet: Secondary | ICD-10-CM | POA: Diagnosis not present

## 2023-05-01 DIAGNOSIS — M19011 Primary osteoarthritis, right shoulder: Secondary | ICD-10-CM | POA: Diagnosis not present

## 2023-05-01 DIAGNOSIS — R278 Other lack of coordination: Secondary | ICD-10-CM | POA: Diagnosis not present

## 2023-05-07 DIAGNOSIS — S31821A Laceration without foreign body of left buttock, initial encounter: Secondary | ICD-10-CM | POA: Diagnosis not present

## 2023-05-07 DIAGNOSIS — S31010A Laceration without foreign body of lower back and pelvis without penetration into retroperitoneum, initial encounter: Secondary | ICD-10-CM | POA: Diagnosis not present

## 2023-05-14 DIAGNOSIS — S31010A Laceration without foreign body of lower back and pelvis without penetration into retroperitoneum, initial encounter: Secondary | ICD-10-CM | POA: Diagnosis not present

## 2023-05-14 DIAGNOSIS — S31821A Laceration without foreign body of left buttock, initial encounter: Secondary | ICD-10-CM | POA: Diagnosis not present

## 2023-05-15 DIAGNOSIS — I2699 Other pulmonary embolism without acute cor pulmonale: Secondary | ICD-10-CM | POA: Diagnosis not present

## 2023-05-15 DIAGNOSIS — R531 Weakness: Secondary | ICD-10-CM | POA: Diagnosis not present

## 2023-05-15 DIAGNOSIS — I480 Paroxysmal atrial fibrillation: Secondary | ICD-10-CM | POA: Diagnosis not present

## 2023-05-15 DIAGNOSIS — C8510 Unspecified B-cell lymphoma, unspecified site: Secondary | ICD-10-CM | POA: Diagnosis not present

## 2023-05-18 ENCOUNTER — Ambulatory Visit: Payer: 59 | Admitting: Podiatry

## 2023-05-18 DIAGNOSIS — G8929 Other chronic pain: Secondary | ICD-10-CM | POA: Diagnosis not present

## 2023-05-18 DIAGNOSIS — M545 Low back pain, unspecified: Secondary | ICD-10-CM | POA: Diagnosis not present

## 2023-05-20 DIAGNOSIS — M25512 Pain in left shoulder: Secondary | ICD-10-CM | POA: Diagnosis not present

## 2023-05-20 DIAGNOSIS — G894 Chronic pain syndrome: Secondary | ICD-10-CM | POA: Diagnosis not present

## 2023-05-20 DIAGNOSIS — K219 Gastro-esophageal reflux disease without esophagitis: Secondary | ICD-10-CM | POA: Diagnosis not present

## 2023-05-20 DIAGNOSIS — I10 Essential (primary) hypertension: Secondary | ICD-10-CM | POA: Diagnosis not present

## 2023-05-20 DIAGNOSIS — R252 Cramp and spasm: Secondary | ICD-10-CM | POA: Diagnosis not present

## 2023-05-20 DIAGNOSIS — M150 Primary generalized (osteo)arthritis: Secondary | ICD-10-CM | POA: Diagnosis not present

## 2023-05-20 DIAGNOSIS — I482 Chronic atrial fibrillation, unspecified: Secondary | ICD-10-CM | POA: Diagnosis not present

## 2023-05-21 DIAGNOSIS — S31821A Laceration without foreign body of left buttock, initial encounter: Secondary | ICD-10-CM | POA: Diagnosis not present

## 2023-05-21 DIAGNOSIS — L98422 Non-pressure chronic ulcer of back with fat layer exposed: Secondary | ICD-10-CM | POA: Diagnosis not present

## 2023-05-21 DIAGNOSIS — L98415 Non-pressure chronic ulcer of buttock with muscle involvement without evidence of necrosis: Secondary | ICD-10-CM | POA: Diagnosis not present

## 2023-05-21 DIAGNOSIS — L89616 Pressure-induced deep tissue damage of right heel: Secondary | ICD-10-CM | POA: Diagnosis not present

## 2023-05-23 DIAGNOSIS — G629 Polyneuropathy, unspecified: Secondary | ICD-10-CM | POA: Diagnosis not present

## 2023-05-23 DIAGNOSIS — C8330 Diffuse large B-cell lymphoma, unspecified site: Secondary | ICD-10-CM | POA: Diagnosis not present

## 2023-05-23 DIAGNOSIS — I482 Chronic atrial fibrillation, unspecified: Secondary | ICD-10-CM | POA: Diagnosis not present

## 2023-05-23 DIAGNOSIS — K219 Gastro-esophageal reflux disease without esophagitis: Secondary | ICD-10-CM | POA: Diagnosis not present

## 2023-05-23 DIAGNOSIS — G9520 Unspecified cord compression: Secondary | ICD-10-CM | POA: Diagnosis not present

## 2023-05-23 DIAGNOSIS — M150 Primary generalized (osteo)arthritis: Secondary | ICD-10-CM | POA: Diagnosis not present

## 2023-05-23 DIAGNOSIS — G894 Chronic pain syndrome: Secondary | ICD-10-CM | POA: Diagnosis not present

## 2023-05-23 DIAGNOSIS — K5900 Constipation, unspecified: Secondary | ICD-10-CM | POA: Diagnosis not present

## 2023-05-23 DIAGNOSIS — I10 Essential (primary) hypertension: Secondary | ICD-10-CM | POA: Diagnosis not present

## 2023-06-04 DIAGNOSIS — I10 Essential (primary) hypertension: Secondary | ICD-10-CM | POA: Diagnosis not present

## 2023-06-04 DIAGNOSIS — S31010A Laceration without foreign body of lower back and pelvis without penetration into retroperitoneum, initial encounter: Secondary | ICD-10-CM | POA: Diagnosis not present

## 2023-06-04 DIAGNOSIS — S31821A Laceration without foreign body of left buttock, initial encounter: Secondary | ICD-10-CM | POA: Diagnosis not present

## 2023-06-05 DIAGNOSIS — R7989 Other specified abnormal findings of blood chemistry: Secondary | ICD-10-CM | POA: Diagnosis not present

## 2023-06-05 DIAGNOSIS — E782 Mixed hyperlipidemia: Secondary | ICD-10-CM | POA: Diagnosis not present

## 2023-06-06 DIAGNOSIS — R51 Headache with orthostatic component, not elsewhere classified: Secondary | ICD-10-CM | POA: Diagnosis not present

## 2023-06-06 DIAGNOSIS — E7800 Pure hypercholesterolemia, unspecified: Secondary | ICD-10-CM | POA: Diagnosis not present

## 2023-06-06 DIAGNOSIS — M533 Sacrococcygeal disorders, not elsewhere classified: Secondary | ICD-10-CM | POA: Diagnosis not present

## 2023-06-08 DIAGNOSIS — D649 Anemia, unspecified: Secondary | ICD-10-CM | POA: Diagnosis not present

## 2023-06-08 DIAGNOSIS — E7800 Pure hypercholesterolemia, unspecified: Secondary | ICD-10-CM | POA: Diagnosis not present

## 2023-06-09 DIAGNOSIS — R509 Fever, unspecified: Secondary | ICD-10-CM | POA: Diagnosis not present

## 2023-06-11 DIAGNOSIS — S31010A Laceration without foreign body of lower back and pelvis without penetration into retroperitoneum, initial encounter: Secondary | ICD-10-CM | POA: Diagnosis not present

## 2023-06-11 DIAGNOSIS — I10 Essential (primary) hypertension: Secondary | ICD-10-CM | POA: Diagnosis not present

## 2023-06-11 DIAGNOSIS — S31821A Laceration without foreign body of left buttock, initial encounter: Secondary | ICD-10-CM | POA: Diagnosis not present

## 2023-06-12 DIAGNOSIS — M4712 Other spondylosis with myelopathy, cervical region: Secondary | ICD-10-CM | POA: Diagnosis not present

## 2023-06-12 DIAGNOSIS — M545 Low back pain, unspecified: Secondary | ICD-10-CM | POA: Diagnosis not present

## 2023-06-12 DIAGNOSIS — S31809A Unspecified open wound of unspecified buttock, initial encounter: Secondary | ICD-10-CM | POA: Diagnosis not present

## 2023-06-12 DIAGNOSIS — G8929 Other chronic pain: Secondary | ICD-10-CM | POA: Diagnosis not present

## 2023-06-15 DIAGNOSIS — G894 Chronic pain syndrome: Secondary | ICD-10-CM | POA: Diagnosis not present

## 2023-06-15 DIAGNOSIS — D649 Anemia, unspecified: Secondary | ICD-10-CM | POA: Diagnosis not present

## 2023-06-15 DIAGNOSIS — M4628 Osteomyelitis of vertebra, sacral and sacrococcygeal region: Secondary | ICD-10-CM | POA: Diagnosis not present

## 2023-06-15 DIAGNOSIS — R509 Fever, unspecified: Secondary | ICD-10-CM | POA: Diagnosis not present

## 2023-06-15 DIAGNOSIS — I10 Essential (primary) hypertension: Secondary | ICD-10-CM | POA: Diagnosis not present

## 2023-06-15 DIAGNOSIS — R110 Nausea: Secondary | ICD-10-CM | POA: Diagnosis not present

## 2023-06-15 DIAGNOSIS — E7800 Pure hypercholesterolemia, unspecified: Secondary | ICD-10-CM | POA: Diagnosis not present

## 2023-06-15 DIAGNOSIS — I482 Chronic atrial fibrillation, unspecified: Secondary | ICD-10-CM | POA: Diagnosis not present

## 2023-06-16 ENCOUNTER — Encounter (HOSPITAL_COMMUNITY): Payer: Self-pay | Admitting: Emergency Medicine

## 2023-06-16 ENCOUNTER — Inpatient Hospital Stay (HOSPITAL_COMMUNITY)
Admission: EM | Admit: 2023-06-16 | Discharge: 2023-06-20 | DRG: 871 | Disposition: A | Discharge status: Hospice | Payer: 59 | Source: Skilled Nursing Facility | Attending: Internal Medicine | Admitting: Internal Medicine

## 2023-06-16 ENCOUNTER — Emergency Department (HOSPITAL_COMMUNITY): Payer: 59

## 2023-06-16 ENCOUNTER — Other Ambulatory Visit: Payer: Self-pay

## 2023-06-16 DIAGNOSIS — M609 Myositis, unspecified: Secondary | ICD-10-CM | POA: Diagnosis present

## 2023-06-16 DIAGNOSIS — Z86711 Personal history of pulmonary embolism: Secondary | ICD-10-CM

## 2023-06-16 DIAGNOSIS — L89154 Pressure ulcer of sacral region, stage 4: Secondary | ICD-10-CM | POA: Diagnosis present

## 2023-06-16 DIAGNOSIS — E871 Hypo-osmolality and hyponatremia: Secondary | ICD-10-CM | POA: Diagnosis not present

## 2023-06-16 DIAGNOSIS — Z7401 Bed confinement status: Secondary | ICD-10-CM

## 2023-06-16 DIAGNOSIS — I499 Cardiac arrhythmia, unspecified: Secondary | ICD-10-CM | POA: Diagnosis not present

## 2023-06-16 DIAGNOSIS — Z7189 Other specified counseling: Secondary | ICD-10-CM | POA: Diagnosis not present

## 2023-06-16 DIAGNOSIS — I10 Essential (primary) hypertension: Secondary | ICD-10-CM | POA: Diagnosis not present

## 2023-06-16 DIAGNOSIS — R109 Unspecified abdominal pain: Secondary | ICD-10-CM | POA: Diagnosis not present

## 2023-06-16 DIAGNOSIS — M4802 Spinal stenosis, cervical region: Secondary | ICD-10-CM | POA: Diagnosis present

## 2023-06-16 DIAGNOSIS — Z743 Need for continuous supervision: Secondary | ICD-10-CM | POA: Diagnosis not present

## 2023-06-16 DIAGNOSIS — Z96652 Presence of left artificial knee joint: Secondary | ICD-10-CM | POA: Diagnosis present

## 2023-06-16 DIAGNOSIS — M4628 Osteomyelitis of vertebra, sacral and sacrococcygeal region: Secondary | ICD-10-CM | POA: Diagnosis not present

## 2023-06-16 DIAGNOSIS — Z90710 Acquired absence of both cervix and uterus: Secondary | ICD-10-CM

## 2023-06-16 DIAGNOSIS — M869 Osteomyelitis, unspecified: Secondary | ICD-10-CM | POA: Diagnosis present

## 2023-06-16 DIAGNOSIS — Z515 Encounter for palliative care: Secondary | ICD-10-CM

## 2023-06-16 DIAGNOSIS — E860 Dehydration: Secondary | ICD-10-CM | POA: Diagnosis present

## 2023-06-16 DIAGNOSIS — I4891 Unspecified atrial fibrillation: Secondary | ICD-10-CM | POA: Diagnosis present

## 2023-06-16 DIAGNOSIS — K429 Umbilical hernia without obstruction or gangrene: Secondary | ICD-10-CM | POA: Diagnosis not present

## 2023-06-16 DIAGNOSIS — Z882 Allergy status to sulfonamides status: Secondary | ICD-10-CM

## 2023-06-16 DIAGNOSIS — K449 Diaphragmatic hernia without obstruction or gangrene: Secondary | ICD-10-CM | POA: Diagnosis not present

## 2023-06-16 DIAGNOSIS — M549 Dorsalgia, unspecified: Secondary | ICD-10-CM | POA: Diagnosis not present

## 2023-06-16 DIAGNOSIS — R9431 Abnormal electrocardiogram [ECG] [EKG]: Secondary | ICD-10-CM | POA: Diagnosis not present

## 2023-06-16 DIAGNOSIS — Z9842 Cataract extraction status, left eye: Secondary | ICD-10-CM

## 2023-06-16 DIAGNOSIS — G9341 Metabolic encephalopathy: Secondary | ICD-10-CM | POA: Diagnosis present

## 2023-06-16 DIAGNOSIS — Z86718 Personal history of other venous thrombosis and embolism: Secondary | ICD-10-CM

## 2023-06-16 DIAGNOSIS — G9340 Encephalopathy, unspecified: Secondary | ICD-10-CM | POA: Diagnosis present

## 2023-06-16 DIAGNOSIS — E669 Obesity, unspecified: Secondary | ICD-10-CM | POA: Diagnosis present

## 2023-06-16 DIAGNOSIS — E162 Hypoglycemia, unspecified: Secondary | ICD-10-CM | POA: Diagnosis present

## 2023-06-16 DIAGNOSIS — R0602 Shortness of breath: Secondary | ICD-10-CM | POA: Diagnosis not present

## 2023-06-16 DIAGNOSIS — R404 Transient alteration of awareness: Secondary | ICD-10-CM | POA: Diagnosis not present

## 2023-06-16 DIAGNOSIS — Z66 Do not resuscitate: Secondary | ICD-10-CM | POA: Diagnosis present

## 2023-06-16 DIAGNOSIS — G894 Chronic pain syndrome: Secondary | ICD-10-CM | POA: Diagnosis not present

## 2023-06-16 DIAGNOSIS — G319 Degenerative disease of nervous system, unspecified: Secondary | ICD-10-CM | POA: Diagnosis not present

## 2023-06-16 DIAGNOSIS — R4182 Altered mental status, unspecified: Principal | ICD-10-CM

## 2023-06-16 DIAGNOSIS — A419 Sepsis, unspecified organism: Secondary | ICD-10-CM | POA: Diagnosis not present

## 2023-06-16 DIAGNOSIS — I480 Paroxysmal atrial fibrillation: Secondary | ICD-10-CM | POA: Diagnosis present

## 2023-06-16 DIAGNOSIS — C8330 Diffuse large B-cell lymphoma, unspecified site: Secondary | ICD-10-CM | POA: Diagnosis present

## 2023-06-16 DIAGNOSIS — Z5181 Encounter for therapeutic drug level monitoring: Secondary | ICD-10-CM | POA: Diagnosis not present

## 2023-06-16 DIAGNOSIS — Z9049 Acquired absence of other specified parts of digestive tract: Secondary | ICD-10-CM

## 2023-06-16 DIAGNOSIS — G8929 Other chronic pain: Secondary | ICD-10-CM | POA: Diagnosis present

## 2023-06-16 DIAGNOSIS — R410 Disorientation, unspecified: Secondary | ICD-10-CM | POA: Diagnosis not present

## 2023-06-16 DIAGNOSIS — I482 Chronic atrial fibrillation, unspecified: Secondary | ICD-10-CM | POA: Diagnosis not present

## 2023-06-16 DIAGNOSIS — Z79899 Other long term (current) drug therapy: Secondary | ICD-10-CM

## 2023-06-16 DIAGNOSIS — T361X5A Adverse effect of cephalosporins and other beta-lactam antibiotics, initial encounter: Secondary | ICD-10-CM | POA: Diagnosis not present

## 2023-06-16 DIAGNOSIS — D638 Anemia in other chronic diseases classified elsewhere: Secondary | ICD-10-CM | POA: Diagnosis present

## 2023-06-16 DIAGNOSIS — E876 Hypokalemia: Secondary | ICD-10-CM | POA: Diagnosis present

## 2023-06-16 DIAGNOSIS — K432 Incisional hernia without obstruction or gangrene: Secondary | ICD-10-CM | POA: Diagnosis present

## 2023-06-16 DIAGNOSIS — E43 Unspecified severe protein-calorie malnutrition: Secondary | ICD-10-CM | POA: Diagnosis present

## 2023-06-16 DIAGNOSIS — Z7901 Long term (current) use of anticoagulants: Secondary | ICD-10-CM

## 2023-06-16 DIAGNOSIS — E7800 Pure hypercholesterolemia, unspecified: Secondary | ICD-10-CM | POA: Diagnosis present

## 2023-06-16 DIAGNOSIS — E8770 Fluid overload, unspecified: Secondary | ICD-10-CM | POA: Diagnosis not present

## 2023-06-16 DIAGNOSIS — N179 Acute kidney failure, unspecified: Secondary | ICD-10-CM | POA: Diagnosis not present

## 2023-06-16 DIAGNOSIS — R0989 Other specified symptoms and signs involving the circulatory and respiratory systems: Secondary | ICD-10-CM | POA: Diagnosis not present

## 2023-06-16 DIAGNOSIS — I6782 Cerebral ischemia: Secondary | ICD-10-CM | POA: Diagnosis not present

## 2023-06-16 DIAGNOSIS — R519 Headache, unspecified: Secondary | ICD-10-CM | POA: Diagnosis not present

## 2023-06-16 DIAGNOSIS — Z6835 Body mass index (BMI) 35.0-35.9, adult: Secondary | ICD-10-CM

## 2023-06-16 DIAGNOSIS — Z888 Allergy status to other drugs, medicaments and biological substances status: Secondary | ICD-10-CM

## 2023-06-16 DIAGNOSIS — S31829A Unspecified open wound of left buttock, initial encounter: Secondary | ICD-10-CM | POA: Diagnosis not present

## 2023-06-16 DIAGNOSIS — E785 Hyperlipidemia, unspecified: Secondary | ICD-10-CM | POA: Diagnosis present

## 2023-06-16 HISTORY — DX: Other pulmonary embolism with acute cor pulmonale: I26.09

## 2023-06-16 LAB — URINALYSIS, W/ REFLEX TO CULTURE (INFECTION SUSPECTED)
Bilirubin Urine: NEGATIVE
Glucose, UA: NEGATIVE mg/dL
Ketones, ur: NEGATIVE mg/dL
Leukocytes,Ua: NEGATIVE
Nitrite: NEGATIVE
Protein, ur: NEGATIVE mg/dL
Specific Gravity, Urine: 1.017 (ref 1.005–1.030)
pH: 5 (ref 5.0–8.0)

## 2023-06-16 LAB — I-STAT CG4 LACTIC ACID, ED: Lactic Acid, Venous: 1.3 mmol/L (ref 0.5–1.9)

## 2023-06-16 LAB — CBC
HCT: 30.3 % — ABNORMAL LOW (ref 36.0–46.0)
Hemoglobin: 8.9 g/dL — ABNORMAL LOW (ref 12.0–15.0)
MCH: 30.3 pg (ref 26.0–34.0)
MCHC: 29.4 g/dL — ABNORMAL LOW (ref 30.0–36.0)
MCV: 103.1 fL — ABNORMAL HIGH (ref 80.0–100.0)
Platelets: 243 10*3/uL (ref 150–400)
RBC: 2.94 MIL/uL — ABNORMAL LOW (ref 3.87–5.11)
RDW: 15.9 % — ABNORMAL HIGH (ref 11.5–15.5)
WBC: 17.5 10*3/uL — ABNORMAL HIGH (ref 4.0–10.5)
nRBC: 0.1 % (ref 0.0–0.2)

## 2023-06-16 LAB — COMPREHENSIVE METABOLIC PANEL
ALT: 13 U/L (ref 0–44)
AST: 30 U/L (ref 15–41)
Albumin: 1.5 g/dL — ABNORMAL LOW (ref 3.5–5.0)
Alkaline Phosphatase: 103 U/L (ref 38–126)
Anion gap: 15 (ref 5–15)
BUN: 25 mg/dL — ABNORMAL HIGH (ref 8–23)
CO2: 19 mmol/L — ABNORMAL LOW (ref 22–32)
Calcium: 8.2 mg/dL — ABNORMAL LOW (ref 8.9–10.3)
Chloride: 100 mmol/L (ref 98–111)
Creatinine, Ser: 2.02 mg/dL — ABNORMAL HIGH (ref 0.44–1.00)
GFR, Estimated: 26 mL/min — ABNORMAL LOW (ref >60)
Glucose, Bld: 60 mg/dL — ABNORMAL LOW (ref 70–99)
Potassium: 3.3 mmol/L — ABNORMAL LOW (ref 3.5–5.1)
Sodium: 134 mmol/L — ABNORMAL LOW (ref 135–145)
Total Bilirubin: 1 mg/dL (ref 0.3–1.2)
Total Protein: 5.6 g/dL — ABNORMAL LOW (ref 6.5–8.1)

## 2023-06-16 LAB — PROTIME-INR
INR: 2.4 — ABNORMAL HIGH (ref 0.8–1.2)
Prothrombin Time: 26.3 seconds — ABNORMAL HIGH (ref 11.4–15.2)

## 2023-06-16 LAB — CBC WITH DIFFERENTIAL/PLATELET
Abs Immature Granulocytes: 0.4 10*3/uL — ABNORMAL HIGH (ref 0.00–0.07)
Basophils Absolute: 0.1 10*3/uL (ref 0.0–0.1)
Basophils Relative: 0 %
Eosinophils Absolute: 0.1 10*3/uL (ref 0.0–0.5)
Eosinophils Relative: 1 %
HCT: 25.5 % — ABNORMAL LOW (ref 36.0–46.0)
Hemoglobin: 7.7 g/dL — ABNORMAL LOW (ref 12.0–15.0)
Immature Granulocytes: 2 %
Lymphocytes Relative: 16 %
Lymphs Abs: 2.6 10*3/uL (ref 0.7–4.0)
MCH: 29.5 pg (ref 26.0–34.0)
MCHC: 30.2 g/dL (ref 30.0–36.0)
MCV: 97.7 fL (ref 80.0–100.0)
Monocytes Absolute: 1.3 10*3/uL — ABNORMAL HIGH (ref 0.1–1.0)
Monocytes Relative: 8 %
Neutro Abs: 12 10*3/uL — ABNORMAL HIGH (ref 1.7–7.7)
Neutrophils Relative %: 73 %
Platelets: 351 10*3/uL (ref 150–400)
RBC: 2.61 MIL/uL — ABNORMAL LOW (ref 3.87–5.11)
RDW: 15.9 % — ABNORMAL HIGH (ref 11.5–15.5)
WBC: 16.5 10*3/uL — ABNORMAL HIGH (ref 4.0–10.5)
nRBC: 0.1 % (ref 0.0–0.2)

## 2023-06-16 LAB — IRON AND TIBC: Iron: 91 ug/dL (ref 28–170)

## 2023-06-16 LAB — FERRITIN: Ferritin: 1021 ng/mL — ABNORMAL HIGH (ref 11–307)

## 2023-06-16 LAB — TYPE AND SCREEN
ABO/RH(D): O POS
Antibody Screen: NEGATIVE

## 2023-06-16 LAB — VANCOMYCIN, RANDOM: Vancomycin Rm: >60 ug/mL

## 2023-06-16 LAB — APTT: aPTT: 47 seconds — ABNORMAL HIGH (ref 24–36)

## 2023-06-16 LAB — POC OCCULT BLOOD, ED: Fecal Occult Bld: NEGATIVE

## 2023-06-16 MED ORDER — LACTATED RINGERS IV BOLUS: 1500.0000 mL | Freq: Once | INTRAVENOUS | Status: DC

## 2023-06-16 MED ORDER — ACETAMINOPHEN 650 MG RE SUPP: 650.0000 mg | Freq: Four times a day (QID) | RECTAL | Status: DC | PRN

## 2023-06-16 MED ORDER — DEXTROSE 50 % IV SOLN
1.0000 | Freq: Once | INTRAVENOUS | Status: AC
  Administered 2023-06-16: 50 mL via INTRAVENOUS
  Filled 2023-06-16: qty 50

## 2023-06-16 MED ORDER — ATORVASTATIN CALCIUM 10 MG PO TABS
10.0000 mg | ORAL_TABLET | Freq: Every evening | ORAL | Status: DC
  Administered 2023-06-18: 10 mg via ORAL
  Filled 2023-06-16: qty 1

## 2023-06-16 MED ORDER — SODIUM CHLORIDE 0.9 % IV SOLN
500.0000 mg | Freq: Two times a day (BID) | INTRAVENOUS | Status: DC
  Filled 2023-06-16: qty 10

## 2023-06-16 MED ORDER — PANTOPRAZOLE SODIUM 20 MG PO TBEC: 20.0000 mg | DELAYED_RELEASE_TABLET | Freq: Two times a day (BID) | ORAL | Status: DC

## 2023-06-16 MED ORDER — FENTANYL CITRATE PF 50 MCG/ML IJ SOSY
50.0000 ug | PREFILLED_SYRINGE | Freq: Once | INTRAMUSCULAR | Status: AC
  Administered 2023-06-16: 50 ug via INTRAVENOUS
  Filled 2023-06-16: qty 1

## 2023-06-16 MED ORDER — VANCOMYCIN VARIABLE DOSE PER UNSTABLE RENAL FUNCTION (PHARMACIST DOSING): Status: DC

## 2023-06-16 MED ORDER — SODIUM CHLORIDE 0.9% FLUSH
3.0000 mL | Freq: Two times a day (BID) | INTRAVENOUS | Status: DC
  Administered 2023-06-16 – 2023-06-18 (×5): 3 mL via INTRAVENOUS

## 2023-06-16 MED ORDER — SUCRALFATE 1 GM/10ML PO SUSP
1.0000 g | Freq: Two times a day (BID) | ORAL | Status: DC
  Administered 2023-06-16 – 2023-06-18 (×2): 1 g via ORAL
  Filled 2023-06-16 (×7): qty 10

## 2023-06-16 MED ORDER — POTASSIUM CHLORIDE 10 MEQ/100ML IV SOLN
10.0000 meq | INTRAVENOUS | Status: AC
  Administered 2023-06-16 (×2): 10 meq via INTRAVENOUS
  Filled 2023-06-16 (×2): qty 100

## 2023-06-16 MED ORDER — METRONIDAZOLE 500 MG/100ML IV SOLN: 500.0000 mg | Freq: Two times a day (BID) | INTRAVENOUS | Status: DC

## 2023-06-16 MED ORDER — SODIUM CHLORIDE 0.9 % IV SOLN
1.0000 g | Freq: Two times a day (BID) | INTRAVENOUS | Status: DC
  Filled 2023-06-16 (×2): qty 10

## 2023-06-16 MED ORDER — LACTATED RINGERS IV BOLUS
2000.0000 mL | Freq: Once | INTRAVENOUS | Status: AC
  Administered 2023-06-16: 2000 mL via INTRAVENOUS

## 2023-06-16 MED ORDER — SODIUM CHLORIDE 0.9 % IV SOLN
1.0000 g | Freq: Two times a day (BID) | INTRAVENOUS | Status: DC
  Administered 2023-06-16 – 2023-06-18 (×4): 1 g via INTRAVENOUS
  Filled 2023-06-16 (×5): qty 20

## 2023-06-16 MED ORDER — CYANOCOBALAMIN 500 MCG PO TABS
500.0000 ug | ORAL_TABLET | Freq: Every day | ORAL | Status: DC
  Filled 2023-06-16: qty 1

## 2023-06-16 MED ORDER — POLYETHYLENE GLYCOL 3350 17 G PO PACK: 17.0000 g | PACK | Freq: Every day | ORAL | Status: DC | PRN

## 2023-06-16 MED ORDER — ACETAMINOPHEN 325 MG PO TABS
650.0000 mg | ORAL_TABLET | Freq: Four times a day (QID) | ORAL | Status: DC | PRN
  Administered 2023-06-18: 650 mg via ORAL
  Filled 2023-06-16: qty 2

## 2023-06-16 MED ORDER — LACTATED RINGERS IV SOLN: INTRAVENOUS | Status: DC

## 2023-06-16 NOTE — ED Notes (Signed)
Patients sacral wound not covered on arrival and patient covered in stool. Full bedside cleaning done with full linen change. Wet to dry dressing to sacral wound and mepilex placed. EDP placing picture in chart.

## 2023-06-16 NOTE — ED Provider Notes (Signed)
[place] EMERGENCY DEPARTMENT AT [facility] Provider Note   CSN: [id number] Arrival date & time: 06/16/23  1111     History  Chief Complaint  Patient presents with   Altered Mental Status    TYSHELL RAMBERG is a 72 y.o. female with past medical history significant for hypertension, hyperlipidemia, previous pulmonary embolism, arthritis, prediabetes, A-fib currently anticoagulated on Eliquis who presents with concern for altered mental status over the last few days.  Patient with confusion starting last night, and speaking gibberish. Endorses pain in buttocks. Known sacral ulcer reportedly for only 3-4 weeks currently receiving IV Vanc and Cefepime via port. Additionally with increased oxycodone prescription beginning this weekend reportedly.   Altered Mental Status      Home Medications Prior to Admission medications   Medication Sig Start Date End Date Taking? Authorizing Provider  acetaminophen (TYLENOL) 500 MG tablet Take 500 mg by mouth 2 (two) times daily as needed for moderate pain.   Yes [provider]  apixaban (ELIQUIS) 5 MG TABS tablet Take 5 mg by mouth 2 (two) times daily.   Yes [provider]  atorvastatin (LIPITOR) 10 MG tablet Take 10 mg by mouth every evening. 09/08/22  Yes [provider]  diclofenac Sodium (VOLTAREN) 1 % GEL Apply 2 g topically every 6 (six) hours as needed (pain in both knees.). 08/11/20  Yes [provider]  furosemide (LASIX) 20 MG tablet Take 20 mg by mouth daily. 08/20/22  Yes [provider]  gabapentin (NEURONTIN) 400 MG capsule Take 400 mg by mouth 3 (three) times daily.   Yes [provider]  labetalol (NORMODYNE) 100 MG tablet Take 100 mg by mouth 2 (two) times daily. 09/10/22  Yes [provider]  lidocaine (LIDODERM) 5 % Place 1 patch onto the skin daily. Remove & Discard patch within 12 hours or as directed by MD Patient taking differently: Place 1 patch  onto the skin daily. Apply one patch topically to right knee every morning and remove at night. 11/01/22  Yes Henderly, Britni A, PA-C  oxyCODONE (OXY IR/ROXICODONE) 5 MG immediate release tablet Take 5 mg by mouth in the morning, at noon, and at bedtime.   Yes [provider]  pantoprazole (PROTONIX) 20 MG tablet Take 1 tablet (20 mg total) by mouth 2 (two) times daily. 03/10/23  Yes Benjiman Core, MD  polyethylene glycol (MIRALAX MIX-IN PAX) 17 g packet Take 17 g by mouth daily.   Yes [provider]  potassium chloride 20 MEQ/15ML (10%) SOLN Take 20 mEq by mouth daily. 08/19/22  Yes [provider]  PRESCRIPTION MEDICATION Inject 1 g into the vein in the morning and at bedtime. Cefepime 1 gm/50 ml   Yes [provider]  PRESCRIPTION MEDICATION Inject 500 mg into the vein in the morning, at noon, and at bedtime. Metronidazole 500 mg/100 ml   Yes [provider]  sertraline (ZOLOFT) 50 MG tablet Take 50 mg by mouth daily.   Yes [provider]  Sodium Chloride Flush (NORMAL SALINE FLUSH IV) Inject into the vein in the morning and at bedtime. Flush with 10 ml of NS and 5ml 10 units/ml of heparin   Yes [provider]  sodium hypochlorite (DAKIN'S 1/4 STRENGTH) 0.125 % SOLN Irrigate with 1 Application as directed in the morning and at bedtime. Apply to tear wound on left buttocks   Yes [provider]  sucralfate (CARAFATE) 1 GM/10ML suspension Take 1 g by mouth 2 (two)  times daily. 09/07/22  Yes [provider]  traMADol (ULTRAM) 50 MG tablet Take 50 mg by mouth every 6 (six) hours as needed for moderate pain or severe pain.   Yes [provider]  Vancomycin HCl 1000 MG/10ML SOLN Inject 1,000 mg into the vein in the morning and at bedtime.   Yes [provider]  vitamin B-12 (CYANOCOBALAMIN) 500 MCG tablet Take 500 mcg by mouth daily.   Yes [provider]      Allergies    Tylenol  [acetaminophen] and Sulfa antibiotics    Review of Systems   Review of Systems  All other systems reviewed and are negative.   Physical Exam Updated Vital Signs BP 115/62   Pulse 83   Temp 99.4 F (37.4 C) (Rectal)   Resp (!) 21   Ht 5\' 2"  (1.575 m)   Wt 88 kg   LMP  (LMP Unknown)   SpO2 98%   BMI 35.48 kg/m  Physical Exam Vitals and nursing note reviewed.  Constitutional:      General: She is in acute distress.     Appearance: She is obese. She is ill-appearing.     Comments: Not alert or oriented, she is shouting out in pain  HENT:     Head: Normocephalic and atraumatic.  Eyes:     General:        Right eye: No discharge.        Left eye: No discharge.  Cardiovascular:     Rate and Rhythm: Normal rate and regular rhythm.     Heart sounds: No murmur heard.    No friction rub. No gallop.  Pulmonary:     Effort: Pulmonary effort is normal.     Breath sounds: Normal breath sounds.  Abdominal:     General: Bowel sounds are normal.     Palpations: Abdomen is soft.  Skin:    Capillary Refill: Capillary refill takes less than 2 seconds.     Comments: Significant indurated, red, and foul smelling sacral ulcer at least 20 cm across sacrum. Some drainage noted. Patient is incontinent of soft brown stool which is contaminating her wound  Neurological:     Mental Status: She is disoriented.     Comments: Moving all 4 limbs spontaneously, no facial droop noted  Psychiatric:        Mood and Affect: Mood normal.        Behavior: Behavior normal.     ED Results / Procedures / Treatments   Labs (all labs ordered are listed, but only abnormal results are displayed) Labs Reviewed  COMPREHENSIVE METABOLIC PANEL - Abnormal; Notable for the following components:      Result Value   Sodium 134 (*)    Potassium 3.3 (*)    CO2 19 (*)    Glucose, Bld 60 (*)    BUN 25 (*)    Creatinine, Ser 2.02 (*)    Calcium 8.2 (*)    Total Protein 5.6 (*)    Albumin 1.5 (*)    GFR,  Estimated 26 (*)    All other components within normal limits  CBC WITH DIFFERENTIAL/PLATELET - Abnormal; Notable for the following components:   WBC 16.5 (*)    RBC 2.61 (*)    Hemoglobin 7.7 (*)    HCT 25.5 (*)    RDW 15.9 (*)    Neutro Abs 12.0 (*)    Monocytes Absolute 1.3 (*)    Abs Immature Granulocytes 0.40 (*)  All other components within normal limits  PROTIME-INR - Abnormal; Notable for the following components:   Prothrombin Time 26.3 (*)    INR 2.4 (*)    All other components within normal limits  APTT - Abnormal; Notable for the following components:   aPTT 47 (*)    All other components within normal limits  CULTURE, BLOOD (ROUTINE X 2)  CULTURE, BLOOD (ROUTINE X 2)  URINALYSIS, W/ REFLEX TO CULTURE (INFECTION SUSPECTED)  VITAMIN B12  FOLATE  IRON AND TIBC  FERRITIN  RETICULOCYTES  VANCOMYCIN, RANDOM  CBC  I-STAT CG4 LACTIC ACID, ED  I-STAT CG4 LACTIC ACID, ED  POC OCCULT BLOOD, ED  TYPE AND SCREEN    EKG None  Radiology CT Head Wo Contrast  Result Date: 06/16/2023 CLINICAL DATA:  Headache, new onset (Age >= 51y) EXAM: CT HEAD WITHOUT CONTRAST TECHNIQUE: Contiguous axial images were obtained from the base of the skull through the vertex without intravenous contrast. RADIATION DOSE REDUCTION: This exam was performed according to the departmental dose-optimization program which includes automated exposure control, adjustment of the mA and/or kV according to patient size and/or use of iterative reconstruction technique. COMPARISON:  CT scan head from 02/19/2023. FINDINGS: Examination is limited due to patient's motion during data acquisition. Only the gross findings are described as follows: Brain: No evidence of acute infarction, hemorrhage, hydrocephalus, extra-axial collection or mass lesion/mass effect. There is mild prominence of cerebral sulci due to age-related atrophy. There is bilateral periventricular hypodensity, which is non-specific but most likely  seen in the settings of microvascular ischemic changes. Mild in extent. Vascular: No hyperdense vessel or unexpected calcification. Skull: Normal. Negative for fracture or focal lesion. Sinuses/Orbits: No acute finding. Other: None. IMPRESSION: *Limited exam. *No acute intracranial abnormality. Electronically Signed   By: Jules Schick M.D.   On: 06/16/2023 15:00   CT ABDOMEN PELVIS WO CONTRAST  Result Date: 06/16/2023 CLINICAL DATA:  Abdominal pain, acute, nonlocalized EXAM: CT ABDOMEN AND PELVIS WITHOUT CONTRAST TECHNIQUE: Multidetector CT imaging of the abdomen and pelvis was performed following the standard protocol without IV contrast. RADIATION DOSE REDUCTION: This exam was performed according to the departmental dose-optimization program which includes automated exposure control, adjustment of the mA and/or kV according to patient size and/or use of iterative reconstruction technique. COMPARISON:  CT scan abdomen and pelvis from 05/22/2020. FINDINGS: Examination is limited due to lack of intravenous contrast and patient's motion during data acquisition. Lower chest: There are patchy atelectatic changes in the visualized lung bases. No overt consolidation. No pleural effusion. The heart is normal in size. No pericardial effusion. Hepatobiliary: The liver is normal in size. Non-cirrhotic configuration. No suspicious mass. No intrahepatic or extrahepatic bile duct dilation. Small volume layering tiny calcified gallstones noted gallstones. Normal gallbladder wall thickness. No pericholecystic inflammatory changes. Pancreas: Unremarkable. No pancreatic ductal dilatation or surrounding inflammatory changes. Spleen: Within normal limits. No focal lesion. Adrenals/Urinary Tract: Adrenal glands are unremarkable. No suspicious renal mass. There is a hypoattenuating structure in the left kidney interpolar region, anteriorly which exhibits tiny foci of dystrophic calcifications along the lateral aspect. This has  decreased since the prior study from 2021 and may represent involuting slightly complex renal cyst. There is an additional hypoattenuating structure in the right kidney upper pole, laterally, which is incompletely characterized on the current examination but appears unchanged since the prior study. No hydronephrosis. No renal or ureteric calculi. Unremarkable urinary bladder. Stomach/Bowel: There is a small sliding hiatal hernia. There are at least 2 diverticula arising  from the distal duodenum. No disproportionate dilation of the small or large bowel loops. No evidence of abnormal bowel wall thickening or inflammatory changes. The appendix is unremarkable. There are multiple diverticula mainly in the left hemi colon, without imaging signs of diverticulitis. Vascular/Lymphatic: No ascites or pneumoperitoneum. No abdominal or pelvic lymphadenopathy, by size criteria. No aneurysmal dilation of the major abdominal arteries. There are mild peripheral atherosclerotic vascular calcifications of the aorta and its major branches. Reproductive: The uterus is surgically absent. No large adnexal mass. Other: Multiple midline periumbilical incisional hernias noted containing fat. There is a hernia along the superior aspect which exhibits herniation of portion of the transverse colon without obstruction. There is a large decubitus ulcer over the left paramedian gluteal region with soft tissue defect of approximately 2.8 x 8.4 cm, reaching up to the coccyx. Musculoskeletal: No suspicious osseous lesions. There are moderate - marked multilevel degenerative changes in the visualized spine. IMPRESSION: 1. No acute inflammatory process identified within the abdomen or pelvis. 2. There is a large decubitus ulcer over the left paramedian gluteal region reaching up to the coccyx. 3. Multiple periumbilical midline incisional hernias with 1 hernia containing unobstructed portion of transverse colon. 4. Multiple other nonacute  observations, as described above. Electronically Signed   By: Jules Schick M.D.   On: 06/16/2023 14:55   DG Chest Port 1 View  Result Date: 06/16/2023 CLINICAL DATA:  Questionable sepsis - evaluate for abnormality. EXAM: PORTABLE CHEST 1 VIEW COMPARISON:  02/19/2023. FINDINGS: Rotated patient. Low lung volumes accentuate the pulmonary vasculature and cardiomediastinal silhouette. No consolidation or pulmonary edema. No definite pleural effusion or pneumothorax, though patient's head obscures the right lung apex. IMPRESSION: Low lung volumes without evidence of acute cardiopulmonary disease. Electronically Signed   By: Orvan Falconer M.D.   On: 06/16/2023 12:46    Procedures Procedures    Medications Ordered in ED Medications  vancomycin variable dose per unstable renal function (pharmacist dosing) (has no administration in time range)  ceFEPIme (MAXIPIME) 1 g in sodium chloride 0.9 % 100 mL IVPB (has no administration in time range)  metroNIDAZOLE (FLAGYL) IVPB 500 mg (has no administration in time range)  atorvastatin (LIPITOR) tablet 10 mg (has no administration in time range)  pantoprazole (PROTONIX) EC tablet 20 mg (has no administration in time range)  sucralfate (CARAFATE) 1 GM/10ML suspension 1 g (has no administration in time range)  vitamin B-12 (CYANOCOBALAMIN) tablet 500 mcg (has no administration in time range)  sodium chloride flush (NS) 0.9 % injection 3 mL (has no administration in time range)  acetaminophen (TYLENOL) tablet 650 mg (has no administration in time range)    Or  acetaminophen (TYLENOL) suppository 650 mg (has no administration in time range)  polyethylene glycol (MIRALAX / GLYCOLAX) packet 17 g (has no administration in time range)  lactated ringers infusion (has no administration in time range)  fentaNYL (SUBLIMAZE) injection 50 mcg (50 mcg Intravenous Given 06/16/23 1300)  lactated ringers bolus 2,000 mL (2,000 mLs Intravenous New Bag/Given 06/16/23 1304)   dextrose 50 % solution 50 mL (50 mLs Intravenous Given 06/16/23 1426)    ED Course/ Medical Decision Making/ A&P Clinical Course as of 06/16/23 1547  Tue Jun 16, 2023  1139 <90 yesterday, placed on oxygen Reportedly on 3 dif abx for her  [CP]    Clinical Course User Index [CP] Denisa Enterline H, PA-C  Medical Decision Making Amount and/or Complexity of Data Reviewed Labs: ordered. Radiology: ordered. ECG/medicine tests: ordered.  Risk Prescription drug management.   This patient is a 72 y.o. female who presents to the ED for concern of altered mental status, concern for infection, this involves an extensive number of treatment options, and is a complaint that carries with it a high risk of complications and morbidity. The emergent differential diagnosis prior to evaluation includes, but is not limited to, developing sepsis secondary to sacral wound versus UTI, intra-abdominal source, pneumonia, versus intracranial abnormality, meningitis, encephalitis, versus other. This is not an exhaustive differential.   Past Medical History / Co-morbidities / Social History: Hypertension, hyperlipidemia, arthritis, obesity, history of PE on anticoagulation, A-fib, anemia of chronic disease, chronic pain  Additional history: Chart reviewed. Pertinent results include: Reviewed lab work, imaging from previous emergency department visits, attempted to review treatment and care of the sacral wound which is present on her buttocks, reportedly only present for a few week although it looks chronic in nature  Physical Exam: Physical exam performed. The pertinent findings include: Not alert or oriented, she is shouting out in pain  Significant indurated, red, and foul smelling sacral ulcer at least 20 cm across sacrum. Some drainage noted. Patient is incontinent of soft brown stool which is contaminating her wound   Moving all 4 limbs spontaneously, no facial droop noted    Lab Tests: I ordered, and personally interpreted labs.  The pertinent results include: CMP notable for mild hyponatremia, sodium 134, hypokalemia, testing 3.3, she is mildly hypoglycemic, glucose 60 we will administer D50.  She has elevated BUN at 25, she has an AKI with creatinine 2.0 from baseline around 1.  Significant protein calorie malnutrition suspected as total protein 5.6, albumin 1.5.  CBC notable for significant leukocytosis, white blood cell 16.5, suspect developing sepsis despite IV antibiotics for her sacral ulcer, her hemoglobin has dropped drastically, 7.7 today from baseline around 12.5.   Imaging Studies: I ordered imaging studies including CT abdomen pelvis, CT head without contrast, plain, chest x-ray. I independently visualized and interpreted imaging which showed dense of acute intracranial or intrathoracic abnormality, CT abdomen pelvis demonstrates a large sacral ulcer with no evidence of acute intra-abdominal infection. I agree with the radiologist interpretation.   Cardiac Monitoring:  The patient was maintained on a cardiac monitor.  My attending physician Dr. Fredderick Phenix viewed and interpreted the cardiac monitored which showed an underlying rhythm of: NSR. I agree with this interpretation.   Medications: I ordered medication including thank cefepime, fluids for concern of developing sepsis, D50 for hypoglycemia, fentanyl for pain  Consultations Obtained: I requested consultation with the hospitalist, spoke with Dr. Alinda Money admit,  and discussed lab and imaging findings as well as pertinent plan - they recommend: Given for developing sepsis likely secondary to sacral ulcer   Disposition: After consideration of the diagnostic results and the patients response to treatment, I feel that patient will require admission as discussed above for IV antibiotics .   I discussed this case with my attending physician Dr. Fredderick Phenix who cosigned this note including patient's presenting  symptoms, physical exam, and planned diagnostics and interventions. Attending physician stated agreement with plan or made changes to plan which were implemented.    Final Clinical Impression(s) / ED Diagnoses Final diagnoses:  None    Rx / DC Orders ED Discharge Orders     None         Olene Floss, PA-C 06/16/23 1547  Rolan Bucco, MD 06/16/23 [phone]

## 2023-06-16 NOTE — Progress Notes (Addendum)
Pharmacy Antibiotic Note  ADDENDUM 06/16/23 3:59 PM: Changing cefepime to meropenem given concern for neurotoxicity on cefepime outpt w/ AKI on presentation Meropenem 1000mg  q12h Vancomycin Random still has not been processed at this time - will f/u  Delmar Landau, PharmD, BCPS 06/16/2023 4:01 PM ED Clinical Pharmacist -  [phone]   Lauren Santana is a 72 y.o. female for which pharmacy has been consulted for cefepime and vancomycin dosing for  wound infection .  Patient with a history of HTN, HLD, pre-DM, PE. Patient presenting with AMS.  Pt receiving cefepime and vancomycin at [facility]. Has been on since 7/13. Per facility, last doses were 7/22.  Outpt Abx: Cefepime 1g q12h Vancomycin 1q12h Start 7/13: Duration 6 weeks  SCr 2.02 - will assume acutely elevated but I am unsure of baseline but was 0.54 in April WBC 16.5; LA 1.3; T 99.4; HR 83; RR 21  Plan: Vancomycin random level ordered to assess therapeutic status of vancomycin given likely AKI Cefepime 2g q12hr  Monitor WBC, fever, renal function, cultures De-escalate when able F/u ID recommendations  Height: 5\' 2"  (157.5 cm) Weight: 88 kg (194 lb) IBW/kg (Calculated) : 50.1  Temp (24hrs), Avg:99.4 F (37.4 C), Min:99.4 F (37.4 C), Max:99.4 F (37.4 C)  Recent Labs  Lab 06/16/23 1218 06/16/23 1225  WBC 16.5*  --   LATICACIDVEN  --  1.3    CrCl cannot be calculated (Patient's most recent lab result is older than the maximum 21 days allowed.).    Allergies  Allergen Reactions   Tylenol [Acetaminophen] Other (See Comments)    States "seems like eats my stomach"   Sulfa Antibiotics Other (See Comments)    Unknown allergic reaction    Antimicrobials this admission: Vanc 7/13 >> Cefepime 7/13 >>   Microbiology results: Pending  Thank you for allowing pharmacy to be a part of this patient's care.  Delmar Landau, PharmD, BCPS 06/16/2023 1:19 PM ED Clinical Pharmacist -  [phone]

## 2023-06-16 NOTE — ED Notes (Signed)
Update given to son, [name].

## 2023-06-16 NOTE — ED Triage Notes (Signed)
Per [facility] pt coming from [facility] for altered mental status over past few days. States patient speaking gibberish onset this morning however was confused yesterday. [facility] staff put pt on oxygen but patient was never hypoxic. Patient has sacral wound.

## 2023-06-16 NOTE — H&P (Addendum)
History and Physical   Lauren Santana [facility] PIR:[id number] DOB: 08/28/1951 DOA: 06/16/2023  PCP: Lauren Rakers, MD   Patient coming from: [facility]  Chief Complaint: Altered status  HPI: Lauren Santana is a 72 y.o. female with medical history significant of hypertension, hyperlipidemia, obesity, atrial fibrillation, anemia, diffuse large B-cell lymphoma, history of PE, chronic pain presenting with altered mental status from facility.  History obtained with assistance of chart review and family due to patient's encephalopathy.  Patient has been residing at nursing facility, she has been receiving IV antibiotics including vancomycin, cefepime, Flagyl for her large decubitus ulcer.  She has had worsening confusion per staff recently.  Last several days she has had worsening symptoms.  She has more recently been speaking nonsensically  and repeating phrases over and over.  At baseline she is reportedly alert and oriented x 4.  Unable to complete full and accurate review of systems due to patient's encephalopathy.  ED Course: Vital signs in the ED notable for low-grade fever of 99.4.  Lab workup included CMP with sodium 134, potassium 3.3, bicarb 19, BUN 25, creatinine elevated to 2 from baseline of 0.6, glucose 60, calcium 8.3, albumin 1.5, protein 5.6.  CBC with leukocytosis to 16.5, hemoglobin 7.7 down from 11 3 months ago.  PT, PTT, INR all elevated at 26.3, 47, 2.4 respectively.  FOBT is negative.  Lactic acid normal with repeat pending.  Urinalysis and blood culture pending.  Chest x-ray showing decreased lung volumes but no acute abnormality.  CT head was limited but showed no acute abnormality.  CT of the abdomen and pelvis showed no acute abnormality but showed continued large decubitus ulcer at the left gluteus to the coccyx.  Also noted was multiple periumbilical incisional hernias.  Patient received vancomycin and cefepime in the ED.  Also received 2 L of IV fluids and dextrose.   Received fentanyl as well.  Review of Systems: Unable to complete full and accurate review of systems due to patient's encephalopathy.  Past Medical History:  Diagnosis Date   Acute deep vein thrombosis (DVT) of right lower extremity (HCC) 02/14/2016   Arthritis    Edema    B/LLE   Endometriosis    High cholesterol    Hypertension    Numbness    feet   Pre-diabetes    Pulmonary emboli (HCC) 02/13/2016   Pulmonary embolism (HCC)    recurrent, second episode in 06/2020   Pulmonary embolism with acute cor pulmonale (HCC)    Walker as ambulation aid    Wears dentures    Wears glasses     Past Surgical History:  Procedure Laterality Date   ABDOMINAL HYSTERECTOMY     and BSO   CATARACT EXTRACTION Left 10/2018   CESAREAN SECTION  1969, 1971   COLONOSCOPY     EYE SURGERY Bilateral    CATARACT SX   HERNIA REPAIR  2001   incisional   IR CATHETER TUBE CHANGE  10/12/2020   IR CATHETER TUBE CHANGE  12/07/2020   IR CHOLANGIOGRAM EXISTING TUBE  09/28/2020   IR PERC CHOLECYSTOSTOMY  08/17/2020   IR RADIOLOGIST EVAL & MGMT  11/22/2020   JOINT REPLACEMENT Left    Knee   MAXILLARY ANTROSTOMY Right 01/20/2020   Procedure: MAXILLARY ANTROSTOMY  WITH BIOPSY CALDWELL APPROACH;  Surgeon: Osborn Coho, MD;  Location: [facility] OR;  Service: ENT;  Laterality: Right;   MULTIPLE TOOTH EXTRACTIONS  2011   NASAL ENDOSCOPY Right 02/17/2020   Procedure: NASAL ENDOSCOPY WITH  RIGHT INFERIOR TURNBINATE REDUCTION;  Surgeon: Osborn Coho, MD;  Location: [facility] OR;  Service: ENT;  Laterality: Right;   neck mass removal     "fatty tissue, not cancerous" per pt's son   SINUS ENDO WITH FUSION Right 01/20/2020   Procedure: SINUS ENDO WITH FUSION WITH BIOSPY;  Surgeon: Osborn Coho, MD;  Location: [facility] OR;  Service: ENT;  Laterality: Right;   TOTAL KNEE ARTHROPLASTY Left 07/17/2014   Procedure: TOTAL KNEE ARTHROPLASTY;  Surgeon: Nestor Lewandowsky, MD;  Location: [facility] OR;  Service: Orthopedics;  Laterality: Left;     Social History  reports that she has never smoked. She has never used smokeless tobacco. She reports that she does not drink alcohol and does not use drugs.  Allergies  Allergen Reactions   Tylenol [Acetaminophen] Other (See Comments)    States "seems like eats my stomach"   Sulfa Antibiotics Other (See Comments)    Unknown allergic reaction    Family History  Problem Relation Age of Onset   Colon cancer Sister    Cancer Sister        unsure of type of cancer  Reviewed on admission  Prior to Admission medications   Medication Sig Start Date End Date Taking? Authorizing Provider  acetaminophen (TYLENOL) 500 MG tablet Take 500 mg by mouth 2 (two) times daily as needed for moderate pain.   Yes [provider]  apixaban (ELIQUIS) 5 MG TABS tablet Take 5 mg by mouth 2 (two) times daily.   Yes [provider]  atorvastatin (LIPITOR) 10 MG tablet Take 10 mg by mouth every evening. 09/08/22  Yes [provider]  diclofenac Sodium (VOLTAREN) 1 % GEL Apply 2 g topically every 6 (six) hours as needed (pain in both knees.). 08/11/20  Yes [provider]  furosemide (LASIX) 20 MG tablet Take 20 mg by mouth daily. 08/20/22  Yes [provider]  gabapentin (NEURONTIN) 400 MG capsule Take 400 mg by mouth 3 (three) times daily.   Yes [provider]  labetalol (NORMODYNE) 100 MG tablet Take 100 mg by mouth 2 (two) times daily. 09/10/22  Yes [provider]  lidocaine (LIDODERM) 5 % Place 1 patch onto the skin daily. Remove & Discard patch within 12 hours or as directed by MD Patient taking differently: Place 1 patch onto the skin daily. Apply one patch topically to right knee every morning and remove at night. 11/01/22  Yes Henderly, Britni A, PA-C  oxyCODONE (OXY IR/ROXICODONE) 5 MG immediate release tablet Take 5 mg by mouth in the morning, at noon, and at bedtime.   Yes [provider]  pantoprazole (PROTONIX) 20 MG tablet  Take 1 tablet (20 mg total) by mouth 2 (two) times daily. 03/10/23  Yes Benjiman Core, MD  polyethylene glycol (MIRALAX MIX-IN PAX) 17 g packet Take 17 g by mouth daily.   Yes [provider]  potassium chloride 20 MEQ/15ML (10%) SOLN Take 20 mEq by mouth daily. 08/19/22  Yes [provider]  PRESCRIPTION MEDICATION Inject 1 g into the vein in the morning and at bedtime. Cefepime 1 gm/50 ml   Yes [provider]  PRESCRIPTION MEDICATION Inject 500 mg into the vein in the morning, at noon, and at bedtime. Metronidazole 500 mg/100 ml   Yes [provider]  sertraline (ZOLOFT) 50 MG tablet Take 50 mg by mouth daily.   Yes [provider]  Sodium Chloride Flush (NORMAL SALINE FLUSH IV) Inject into the vein in the  morning and at bedtime. Flush with 10 ml of NS and 5ml 10 units/ml of heparin   Yes [provider]  sodium hypochlorite (DAKIN'S 1/4 STRENGTH) 0.125 % SOLN Irrigate with 1 Application as directed in the morning and at bedtime. Apply to tear wound on left buttocks   Yes [provider]  sucralfate (CARAFATE) 1 GM/10ML suspension Take 1 g by mouth 2 (two) times daily. 09/07/22  Yes [provider]  traMADol (ULTRAM) 50 MG tablet Take 50 mg by mouth every 6 (six) hours as needed for moderate pain or severe pain.   Yes [provider]  Vancomycin HCl 1000 MG/10ML SOLN Inject 1,000 mg into the vein in the morning and at bedtime.   Yes [provider]  vitamin B-12 (CYANOCOBALAMIN) 500 MCG tablet Take 500 mcg by mouth daily.   Yes [provider]    Physical Exam: Vitals:   06/16/23 1215 06/16/23 1300 06/16/23 1305 06/16/23 1315  BP:   115/62   Pulse: 92 86 88 83  Resp: (!) 24 18 18  (!) 21  Temp:      TempSrc:      SpO2: 97% 100% 99% 98%  Weight:      Height:        Physical Exam Constitutional:      General: She is not in acute distress.    Appearance: Normal appearance.  HENT:      Head: Normocephalic and atraumatic.     Mouth/Throat:     Mouth: Mucous membranes are moist.     Pharynx: Oropharynx is clear.  Eyes:     Extraocular Movements: Extraocular movements intact.     Pupils: Pupils are equal, round, and reactive to light.  Cardiovascular:     Rate and Rhythm: Normal rate and regular rhythm.     Pulses: Normal pulses.     Heart sounds: Normal heart sounds.  Pulmonary:     Effort: Pulmonary effort is normal. No respiratory distress.     Breath sounds: Normal breath sounds.  Abdominal:     General: Bowel sounds are normal. There is no distension.     Palpations: Abdomen is soft.     Tenderness: There is no abdominal tenderness.  Musculoskeletal:        General: No swelling or deformity.     Right lower leg: Edema (Chronic) present.     Left lower leg: Edema (Chronic) present.  Skin:    General: Skin is warm and dry.  Neurological:     Mental Status: She is disoriented.    Labs on Admission: I have personally reviewed following labs and imaging studies  CBC: Recent Labs  Lab 06/16/23 1218  WBC 16.5*  NEUTROABS 12.0*  HGB 7.7*  HCT 25.5*  MCV 97.7  PLT 351    Basic Metabolic Panel: Recent Labs  Lab 06/16/23 1218  NA 134*  K 3.3*  CL 100  CO2 19*  GLUCOSE 60*  BUN 25*  CREATININE 2.02*  CALCIUM 8.2*    GFR: Estimated Creatinine Clearance: 25.6 mL/min (A) (by C-G formula based on SCr of 2.02 mg/dL (H)).  Liver Function Tests: Recent Labs  Lab 06/16/23 1218  AST 30  ALT 13  ALKPHOS 103  BILITOT 1.0  PROT 5.6*  ALBUMIN 1.5*    Urine analysis:    Component Value Date/Time   COLORURINE YELLOW 11/01/2022 1820   APPEARANCEUR HAZY (A) 11/01/2022 1820   LABSPEC 1.032 (H) 11/01/2022 1820   PHURINE 6.0 11/01/2022 1820  GLUCOSEU NEGATIVE 11/01/2022 1820   HGBUR NEGATIVE 11/01/2022 1820   BILIRUBINUR NEGATIVE 11/01/2022 1820   KETONESUR NEGATIVE 11/01/2022 1820   PROTEINUR 30 (A) 11/01/2022 1820   UROBILINOGEN 0.2  12/26/2016 1611   NITRITE NEGATIVE 11/01/2022 1820   LEUKOCYTESUR TRACE (A) 11/01/2022 1820    Radiological Exams on Admission: CT Head Wo Contrast  Result Date: 06/16/2023 CLINICAL DATA:  Headache, new onset (Age >= 51y) EXAM: CT HEAD WITHOUT CONTRAST TECHNIQUE: Contiguous axial images were obtained from the base of the skull through the vertex without intravenous contrast. RADIATION DOSE REDUCTION: This exam was performed according to the departmental dose-optimization program which includes automated exposure control, adjustment of the mA and/or kV according to patient size and/or use of iterative reconstruction technique. COMPARISON:  CT scan head from 02/19/2023. FINDINGS: Examination is limited due to patient's motion during data acquisition. Only the gross findings are described as follows: Brain: No evidence of acute infarction, hemorrhage, hydrocephalus, extra-axial collection or mass lesion/mass effect. There is mild prominence of cerebral sulci due to age-related atrophy. There is bilateral periventricular hypodensity, which is non-specific but most likely seen in the settings of microvascular ischemic changes. Mild in extent. Vascular: No hyperdense vessel or unexpected calcification. Skull: Normal. Negative for fracture or focal lesion. Sinuses/Orbits: No acute finding. Other: None. IMPRESSION: *Limited exam. *No acute intracranial abnormality. Electronically Signed   By: Jules Schick M.D.   On: 06/16/2023 15:00   CT ABDOMEN PELVIS WO CONTRAST  Result Date: 06/16/2023 CLINICAL DATA:  Abdominal pain, acute, nonlocalized EXAM: CT ABDOMEN AND PELVIS WITHOUT CONTRAST TECHNIQUE: Multidetector CT imaging of the abdomen and pelvis was performed following the standard protocol without IV contrast. RADIATION DOSE REDUCTION: This exam was performed according to the departmental dose-optimization program which includes automated exposure control, adjustment of the mA and/or kV according to patient  size and/or use of iterative reconstruction technique. COMPARISON:  CT scan abdomen and pelvis from 05/22/2020. FINDINGS: Examination is limited due to lack of intravenous contrast and patient's motion during data acquisition. Lower chest: There are patchy atelectatic changes in the visualized lung bases. No overt consolidation. No pleural effusion. The heart is normal in size. No pericardial effusion. Hepatobiliary: The liver is normal in size. Non-cirrhotic configuration. No suspicious mass. No intrahepatic or extrahepatic bile duct dilation. Small volume layering tiny calcified gallstones noted gallstones. Normal gallbladder wall thickness. No pericholecystic inflammatory changes. Pancreas: Unremarkable. No pancreatic ductal dilatation or surrounding inflammatory changes. Spleen: Within normal limits. No focal lesion. Adrenals/Urinary Tract: Adrenal glands are unremarkable. No suspicious renal mass. There is a hypoattenuating structure in the left kidney interpolar region, anteriorly which exhibits tiny foci of dystrophic calcifications along the lateral aspect. This has decreased since the prior study from 2021 and may represent involuting slightly complex renal cyst. There is an additional hypoattenuating structure in the right kidney upper pole, laterally, which is incompletely characterized on the current examination but appears unchanged since the prior study. No hydronephrosis. No renal or ureteric calculi. Unremarkable urinary bladder. Stomach/Bowel: There is a small sliding hiatal hernia. There are at least 2 diverticula arising from the distal duodenum. No disproportionate dilation of the small or large bowel loops. No evidence of abnormal bowel wall thickening or inflammatory changes. The appendix is unremarkable. There are multiple diverticula mainly in the left hemi colon, without imaging signs of diverticulitis. Vascular/Lymphatic: No ascites or pneumoperitoneum. No abdominal or pelvic  lymphadenopathy, by size criteria. No aneurysmal dilation of the major abdominal arteries. There are mild peripheral atherosclerotic  vascular calcifications of the aorta and its major branches. Reproductive: The uterus is surgically absent. No large adnexal mass. Other: Multiple midline periumbilical incisional hernias noted containing fat. There is a hernia along the superior aspect which exhibits herniation of portion of the transverse colon without obstruction. There is a large decubitus ulcer over the left paramedian gluteal region with soft tissue defect of approximately 2.8 x 8.4 cm, reaching up to the coccyx. Musculoskeletal: No suspicious osseous lesions. There are moderate - marked multilevel degenerative changes in the visualized spine. IMPRESSION: 1. No acute inflammatory process identified within the abdomen or pelvis. 2. There is a large decubitus ulcer over the left paramedian gluteal region reaching up to the coccyx. 3. Multiple periumbilical midline incisional hernias with 1 hernia containing unobstructed portion of transverse colon. 4. Multiple other nonacute observations, as described above. Electronically Signed   By: Jules Schick M.D.   On: 06/16/2023 14:55   DG Chest Port 1 View  Result Date: 06/16/2023 CLINICAL DATA:  Questionable sepsis - evaluate for abnormality. EXAM: PORTABLE CHEST 1 VIEW COMPARISON:  02/19/2023. FINDINGS: Rotated patient. Low lung volumes accentuate the pulmonary vasculature and cardiomediastinal silhouette. No consolidation or pulmonary edema. No definite pleural effusion or pneumothorax, though patient's head obscures the right lung apex. IMPRESSION: Low lung volumes without evidence of acute cardiopulmonary disease. Electronically Signed   By: Orvan Falconer M.D.   On: 06/16/2023 12:46    EKG: Independently reviewed. sinus rhythm at 93 bpm.  Low voltage multiple leads.  Significant baseline artifact.  Nonspecific T wave flattening.  Assessment/Plan Principal  Problem:   Acute encephalopathy Active Problems:   Essential hypertension, benign   Dyslipidemia   Obesity (BMI 30-39.9)   AKI (acute kidney injury) (HCC)   History of pulmonary embolus (PE)   Atrial fibrillation (HCC)   Anemia of chronic disease   Chronic pain   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HCC)   Acute cephalopathy Decubitus ulcer ?Developing sepsis ?Cefepime toxicity > Patient presenting from rehab facility where she has been on broad-spectrum antibiotics including vancomycin and cefepime for her decubitus ulcer.  Do not have culture data available. > Has had worsening encephalopathy last several days now spent speaking nonsensically and repeating phrases.  At baseline is reportedly alert and oriented x 4. > Concern for possible failure of antibiotics and infection with leukocytosis noted in the ED to 16.5.  No other evidence of infection on chest x-ray or CT abdomen pelvis other than known decubitus ulcer.  CT head normal.  Urinalysis and blood cultures pending. > Alternatively her symptoms could be secondary to cefepime toxicity given patient has been on this for a while is 72 years old and has AKI as below. > Given possible failure of antibiotics versus cefepime toxicity will transition to vancomycin and meropenem. - Monitor in progressive unit - Transition antibiotics to vancomycin, meropenem - Trend fever curve and WBC - Follow-up urinalysis and blood cultures - WOC  Anemia > Noted to have hemoglobin 7.7 down from 11 3 months ago.  MCV upper end of normal.  Is chronically on B12. > Does have history of GI bleeding and is on Eliquis however FOBT negative with sufficient sample in ED.  Could still be having intermittent GI bleeding. > Currently PTT, PT, INR all elevated with INR of 2.4. - Monitoring on progressive as above - Will repeat CBC to confirm hemoglobin and type and screen at the same time as drop in hemoglobin back to with IV hydration - Hold  today's  Eliquis given unclear source of anemia and elevated INR/AKI. - Follow-up labs ordered in the ED: B12, folate, ferritin, iron, reticulocytes  AKI Hypokalemia > Did have creatinine of 2.02 in the ED up from baseline of 0.6.  Potassium noted to be 3.3. > Received 2 L IV fluids in the ED. - Continue with IV fluids - 20 mEq IV potassium - Trend  renal function and electrolytes  Hyperlipidemia - Continue home atorvastatin  Atrial fibrillation - Holding home labetalol - Holding home Eliquis  History of PE - Holding home Eliquis  Chronic pain - Continue home tramadol, gabapentin if tolerating PO, otherwise may need IV, not able to currently communicate pain level  History of diffuse large B-cell lymphoma - Currently under observation  DVT prophylaxis: SCDs, plan to resume Eliquis tomorrow Code Status:   Full Family Communication:  Updated at bedside  Disposition Plan:   Patient is from:  [facility]   Anticipated DC to:  Same as above  Anticipated DC date:  2 to 5 days  Anticipated DC barriers: None  Consults called:  None Admission status:  Inpatient, progressive  Severity of Illness: The appropriate patient status for this patient is INPATIENT. Inpatient status is judged to be reasonable and necessary in order to provide the required intensity of service to ensure the patient's safety. The patient's presenting symptoms, physical exam findings, and initial radiographic and laboratory data in the context of their chronic comorbidities is felt to place them at high risk for further clinical deterioration. Furthermore, it is not anticipated that the patient will be medically stable for discharge from the hospital within 2 midnights of admission.   * I certify that at the point of admission it is my clinical judgment that the patient will require inpatient hospital care spanning beyond 2 midnights from the point of admission due to high intensity of service, high risk for further  deterioration and high frequency of surveillance required.Synetta Fail MD [facility]  How to contact the [facility] Attending or Consulting provider 7A - 7P or covering provider during after hours 7P -7A, for this patient?   Check the care team in [facility] and look for a) attending/consulting [facility] provider listed and b) the [facility] team listed Log into [email or web address] and use [place]'s universal password to access. If you do not have the password, please contact the hospital operator. Locate the [facility] provider you are looking for under [facility] and page to a number that you can be directly reached. If you still have difficulty reaching the provider, please page the [facility] (Director on Call) for the Hospitalists listed on amion for assistance.  06/16/2023, 3:48 PM

## 2023-06-16 NOTE — ED Notes (Signed)
Son Lauren Santana [phone] would like an update asap/immediately

## 2023-06-16 NOTE — ED Notes (Signed)
2 unsuccessful IV attempts by this RN and primary RN.

## 2023-06-17 ENCOUNTER — Inpatient Hospital Stay (HOSPITAL_COMMUNITY): Payer: 59

## 2023-06-17 DIAGNOSIS — G9340 Encephalopathy, unspecified: Secondary | ICD-10-CM | POA: Diagnosis not present

## 2023-06-17 LAB — COMPREHENSIVE METABOLIC PANEL
ALT: 12 U/L (ref 0–44)
AST: 25 U/L (ref 15–41)
Albumin: <1.5 g/dL — ABNORMAL LOW (ref 3.5–5.0)
Alkaline Phosphatase: 88 U/L (ref 38–126)
Anion gap: 9 (ref 5–15)
BUN: 26 mg/dL — ABNORMAL HIGH (ref 8–23)
CO2: 19 mmol/L — ABNORMAL LOW (ref 22–32)
Calcium: 7.9 mg/dL — ABNORMAL LOW (ref 8.9–10.3)
Chloride: 104 mmol/L (ref 98–111)
Creatinine, Ser: 1.89 mg/dL — ABNORMAL HIGH (ref 0.44–1.00)
GFR, Estimated: 28 mL/min — ABNORMAL LOW (ref >60)
Glucose, Bld: 52 mg/dL — ABNORMAL LOW (ref 70–99)
Potassium: 3.7 mmol/L (ref 3.5–5.1)
Sodium: 132 mmol/L — ABNORMAL LOW (ref 135–145)
Total Bilirubin: 0.8 mg/dL (ref 0.3–1.2)
Total Protein: 5 g/dL — ABNORMAL LOW (ref 6.5–8.1)

## 2023-06-17 LAB — RETICULOCYTES
Immature Retic Fract: 23 % — ABNORMAL HIGH (ref 2.3–15.9)
RBC.: 2.85 MIL/uL — ABNORMAL LOW (ref 3.87–5.11)
Retic Count, Absolute: 62.7 10*3/uL (ref 19.0–186.0)
Retic Ct Pct: 2.2 % (ref 0.4–3.1)

## 2023-06-17 LAB — CBC
HCT: 27.8 % — ABNORMAL LOW (ref 36.0–46.0)
Hemoglobin: 8.3 g/dL — ABNORMAL LOW (ref 12.0–15.0)
MCH: 29.3 pg (ref 26.0–34.0)
MCHC: 29.9 g/dL — ABNORMAL LOW (ref 30.0–36.0)
MCV: 98.2 fL (ref 80.0–100.0)
Platelets: 297 10*3/uL (ref 150–400)
RBC: 2.83 MIL/uL — ABNORMAL LOW (ref 3.87–5.11)
RDW: 15.9 % — ABNORMAL HIGH (ref 11.5–15.5)
WBC: 17.2 10*3/uL — ABNORMAL HIGH (ref 4.0–10.5)
nRBC: 0.2 % (ref 0.0–0.2)

## 2023-06-17 LAB — AMMONIA: Ammonia: 29 umol/L (ref 9–35)

## 2023-06-17 LAB — CULTURE, BLOOD (ROUTINE X 2)
Culture: NO GROWTH
Special Requests: ADEQUATE

## 2023-06-17 LAB — GLUCOSE, CAPILLARY
Glucose-Capillary: 45 mg/dL — ABNORMAL LOW (ref 70–99)
Glucose-Capillary: 94 mg/dL (ref 70–99)
Glucose-Capillary: 78 mg/dL (ref 70–99)
Glucose-Capillary: 101 mg/dL — ABNORMAL HIGH (ref 70–99)

## 2023-06-17 LAB — VITAMIN B12: Vitamin B-12: 1979 pg/mL — ABNORMAL HIGH (ref 180–914)

## 2023-06-17 LAB — MRSA NEXT GEN BY PCR, NASAL: MRSA by PCR Next Gen: NOT DETECTED

## 2023-06-17 LAB — FOLATE: Folate: 10.4 ng/mL (ref >5.9)

## 2023-06-17 MED ORDER — GADOBUTROL 1 MMOL/ML IV SOLN
10.0000 mL | Freq: Once | INTRAVENOUS | Status: AC | PRN
  Administered 2023-06-17: 10 mL via INTRAVENOUS

## 2023-06-17 MED ORDER — DEXTROSE 50 % IV SOLN
12.5000 g | INTRAVENOUS | Status: AC
  Administered 2023-06-17: 12.5 g via INTRAVENOUS

## 2023-06-17 MED ORDER — DEXTROSE 5 % IV SOLN: INTRAVENOUS | Status: DC

## 2023-06-17 MED ORDER — CHLORHEXIDINE GLUCONATE CLOTH 2 % EX PADS
6.0000 | MEDICATED_PAD | Freq: Every day | CUTANEOUS | Status: DC
  Administered 2023-06-17 – 2023-06-18 (×2): 6 via TOPICAL

## 2023-06-17 MED ORDER — DEXTROSE 50 % IV SOLN
INTRAVENOUS | Status: AC
  Filled 2023-06-17: qty 50

## 2023-06-17 MED ORDER — DEXTROSE IN LACTATED RINGERS 5 % IV SOLN: INTRAVENOUS | Status: DC

## 2023-06-17 NOTE — Progress Notes (Signed)
Virtual RN Enid Derry called this AM. Per labs drawn overnight and yesterday, pt glucose 52 06/17/2023 at 01:34am and 60 06/16/2023 at 12:18pm.   After being notified, this RN check fingerstick BS. BS resulting at 45.   MD Adhikari notified and hypoglycemia standing orders initiated. Patient given 12.5g of D50 and started on D5 @75mL /hr.   BS fingerstick recheck resulted at 94.   Recent order changed to D5LR@100mL /hr.   MD added orders for Q6 BS checks.   (Per MAR; yesterdays BS of 60 was treated with 50mL of D50. No BS fingerstick or recheck done at that time per documentation).

## 2023-06-17 NOTE — ED Notes (Signed)
PT has been nonstop babbling for the entire time she has been in the room.She does let you perform tasks and acknowledges you in the room

## 2023-06-17 NOTE — Plan of Care (Signed)
  Problem: Education: Goal: Knowledge of General Education information will improve Description: Including pain rating scale, medication(s)/side effects and non-pharmacologic comfort measures Outcome: Not Progressing   Problem: Health Behavior/Discharge Planning: Goal: Ability to manage health-related needs will improve Outcome: Not Progressing   Problem: Clinical Measurements: Goal: Ability to maintain clinical measurements within normal limits will improve Outcome: Not Progressing Goal: Will remain free from infection Outcome: Progressing Goal: Diagnostic test results will improve Outcome: Progressing Goal: Respiratory complications will improve Outcome: Progressing Goal: Cardiovascular complication will be avoided Outcome: Progressing   Problem: Activity: Goal: Risk for activity intolerance will decrease Outcome: Not Progressing   Problem: Nutrition: Goal: Adequate nutrition will be maintained Outcome: Not Progressing   Problem: Coping: Goal: Level of anxiety will decrease Outcome: Not Progressing   Problem: Elimination: Goal: Will not experience complications related to bowel motility Outcome: Progressing Goal: Will not experience complications related to urinary retention Outcome: Progressing   Problem: Pain Managment: Goal: General experience of comfort will improve Outcome: Progressing   Problem: Safety: Goal: Ability to remain free from injury will improve Outcome: Progressing   Problem: Skin Integrity: Goal: Risk for impaired skin integrity will decrease Outcome: Not Progressing

## 2023-06-17 NOTE — Progress Notes (Signed)
SLP Cancellation Note  Patient Details Name: Lauren Santana MRN: [id number] DOB: Jul 22, 1951   Cancelled treatment:       Reason Eval/Treat Not Completed: Patient at procedure or test/unavailable- Attempted to see pt for swallowing assessment earlier this am; she was out of room for MRI. Will continue efforts as schedule permits.  Jakelyn Squyres L. Samson Frederic, MA CCC/SLP Clinical Specialist - Acute Care SLP Acute Rehabilitation Services Office number [phone]    [name] [name] 06/17/2023, 12:13 PM

## 2023-06-17 NOTE — TOC Progression Note (Signed)
Transition of Care [facility]) - Initial/Assessment Note    Patient Details  Name: Lauren Santana MRN: [id number] Date of Birth: [birth year]  Transition of Care [facility]) CM/SW Contact:    Ralene Bathe, LCSW Phone Number: 06/17/2023, 2:36 PM  Clinical Narrative:                 LCSW contacted Janie at [facility] and confirmed that the patient is LT at the facility and can return when medically ready.    TOC following.   Expected Discharge Plan: Skilled Nursing Facility     Patient Goals and CMS Choice            Expected Discharge Plan and Services                                              Prior Living Arrangements/Services                       Activities of Daily Living      Permission Sought/Granted                  Emotional Assessment              Admission diagnosis:  Acute encephalopathy [G93.40] Patient Active Problem List   Diagnosis Date Noted   Acute encephalopathy 06/16/2023   Upper GI bleeding 03/10/2023   Ambulatory dysfunction 03/10/2023   Anticoagulated 12/11/2020   Cholecystitis, acute 08/16/2020   Abdominal pain 08/16/2020   Severe sepsis (HCC) 08/16/2020   Pressure injury of skin 08/16/2020   Atrial fibrillation (HCC) 07/24/2020   Thrombocytopenia (HCC) 07/24/2020   Anemia of chronic disease 07/24/2020   Chronic pain 07/24/2020   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HCC) 07/24/2020   History of pulmonary embolus (PE) 07/19/2020   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   S/P BSO (bilateral salpingo-oophorectomy) 01/08/2017   S/P hysterectomy 01/08/2017   Tachycardia    AKI (acute kidney injury) (HCC) 02/13/2016   Pelvic mass in female    Obesity (BMI 30-39.9) 08/20/2015   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   Essential hypertension, benign 07/23/2014   Edema 07/23/2014   Dyslipidemia 07/23/2014   Lower extremity numbness 07/23/2014    Arthritis of knee 07/17/2014   Postmenopausal bleeding 03/10/2012   Endometriosis of pelvis 01/22/2007   PCP:  Renaye Rakers, MD Pharmacy:  No Pharmacies Listed    Social Determinants of Health (SDOH) Social History: SDOH Screenings   Food Insecurity: No Food Insecurity (05/01/2023)   Received from [facility], [facility]  Transportation Needs: No Transportation Needs (05/01/2023)   Received from [facility], [facility]  Utilities: Not At Risk (05/01/2023)   Received from [facility], [facility]  Financial Resource Strain: Low Risk  (05/01/2023)   Received from [facility], [facility]  Social Connections: Unknown (03/30/2023)   Received from [facility], [facility]  Tobacco Use: Low Risk  (06/16/2023)   SDOH Interventions:     Readmission Risk Interventions     No data to display

## 2023-06-17 NOTE — Progress Notes (Signed)
PROGRESS NOTE  Lauren Santana  XBJ:[id number] DOB: 01/29/1951 DOA: 06/16/2023 PCP: Renaye Rakers, MD   Brief Narrative: Patient is a 72 year old female with history of hypertension, hyperlipidemia, obesity, atrial fibrillation, anemia, diffuse large cell B lymphoma, PE, chronic back pain who presented with altered mental status from a facility.  Patient resides at nursing facility and was receiving IV antibiotics including vancomycin cefepime and Flagyl for large decubitus ulcer.  Patient's mentation gradually worsened.  At baseline, she is alert and oriented x 4.  She was mildly febrile presentation.  Labs showed potassium of 3.3, creatinine of 2 (baseline creatinine normal), glucose 60, albumin of 1.5, WBC count of 16.5, hemoglobin of 7.7.  FOBT negative.  Chest x-ray showed decreased lung volumes without any acute findings.  CT head denies any acute abnormality.  CT abdomen/pelvis did not show any acute abnormality but showed large decubitus ulcer on the left gluteus to the coccyx.  Patient was started on vancomycin and meropenem for decubitus ulcer.  General surgery  consulted today.  Assessment & Plan:  Principal Problem:   Acute encephalopathy Active Problems:   Essential hypertension, benign   Dyslipidemia   Obesity (BMI 30-39.9)   AKI (acute kidney injury) (HCC)   History of pulmonary embolus (PE)   Atrial fibrillation (HCC)   Anemia of chronic disease   Chronic pain   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HCC)  Acute encephalopathy: Unclear etiology.  Could be from sepsis or cefepime toxicity or polypharmacy.  She is  disoriented, mumbles and speaking irreverently.We  will get MRI of the brain to rule out a stroke/other findings.  CT head did not show any acute findings.  We might consult neurology if her mentation doesnot improve .  Might need EEG as well.  As per report,patient is usually alert and oriented x 4 at baseline.  Presented with worsening encephalopathy.  UA was not  suspicious for UTI. Takes gabapentin, tramadol, oxycodone, sertraline at  facility.  Currently these  medications are on hold.  Large decubitus ulcer: Will consult general surgery for possible need of debridement.  Continue current antibiotics.  Follow-up blood cultures.Wound care following  Hypoglycemia: Likely from decreased oral intake.  Will consult speech therapy.  Currently n.p.o. due to decreased mentation.  Continue fluid with dextrose  AKI: Baseline creatinine normal.  Patient with creatinine in the range of 2, now improving.  Likely from prerenal AKI from dehydration  Normocytic anemia: Hemoglobin of 12 ,3 months ago.  Now in the range of 8.  Will continue to monitor, no evidence of acute blood loss.    Negative FOBT, iron level optimal  Hypokalemia: Being monitored and supplemented as needed.  Hyperlipidemia: On Lipitor at home.  Paroxysmal A-fib: Takes labetalol for rate control, on anticoagulation with Eliquis.  Eliquis on hold for possible debridement by general surgery.  Currently on normal sinus rhythm  History of PE: On Eliquis  Chronic back pain: Takes tramadol ,oxy at baseline.  Severe protein calorie malnutrition: Albumin of 1.5.  Will consult dietitian  History of diffuse large cell B lymphoma: Currently under observation.  Deconditioning/debility: Has been residing in nursing facility since 1 to 2 years.  Unable to walk.  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     Pressure Injury 06/17/23 Ischial tuberosity Left Stage 4 - Full thickness tissue loss with exposed bone, tendon or muscle. (Active)  06/17/23 0330  Location: Ischial tuberosity  Location Orientation: Left  Staging: Stage 4 - Full thickness tissue  loss with exposed bone, tendon or muscle.  Wound Description (Comments):   Present on Admission: Yes  Dressing Type Moist to dry 06/17/23 0345    DVT prophylaxis:SCDs Start: 06/16/23 1542     Code Status: Full Code  Family  Communication: Called and discussed with son on phone on 7/24  Patient status:Inpatient  Patient is from :SNF  Anticipated discharge to:SNF  Estimated DC date: Not sure   Consultants: General surgery  Procedures:None  Antimicrobials:  Anti-infectives (From admission, onward)    Start     Dose/Rate Route Frequency Ordered Stop   06/16/23 2200  metroNIDAZOLE (FLAGYL) IVPB 500 mg  Status:  Discontinued        500 mg 100 mL/hr over 60 Minutes Intravenous 2 times daily 06/16/23 1545 06/16/23 1558   06/16/23 2200  meropenem (MERREM) 500 mg in sodium chloride 0.9 % 100 mL IVPB  Status:  Discontinued        500 mg 200 mL/hr over 30 Minutes Intravenous Every 12 hours 06/16/23 1558 06/16/23 1600   06/16/23 2200  meropenem (MERREM) 1 g in sodium chloride 0.9 % 100 mL IVPB        1 g 200 mL/hr over 30 Minutes Intravenous Every 12 hours 06/16/23 1600     06/16/23 1500  ceFEPIme (MAXIPIME) 1 g in sodium chloride 0.9 % 100 mL IVPB  Status:  Discontinued        1 g 200 mL/hr over 30 Minutes Intravenous Every 12 hours 06/16/23 1447 06/16/23 1558   06/16/23 1331  vancomycin variable dose per unstable renal function (pharmacist dosing)         Does not apply See admin instructions 06/16/23 1331         Subjective: Patient seen and examined at bedside today.  Lying in bed.  Talking /mumbling , confused, not coherent.  Does not obey commands.  Does not look in distress.  Has foul-smelling sacral ulcer   Objective: Vitals:   06/17/23 0345 06/17/23 0523 06/17/23 0733 06/17/23 0843  BP:  (!) 111/57 131/66 120/62  Pulse:  92 71 76  Resp:  18  18  Temp:  97.7 F (36.5 C) 97.6 F (36.4 C) 98.5 F (36.9 C)  TempSrc:  Oral Oral Oral  SpO2:  94%  (!) 33%  Weight: 85.7 kg     Height:        Intake/Output Summary (Last 24 hours) at 06/17/2023 0847 Last data filed at 06/16/2023 2340 Gross per 24 hour  Intake 300 ml  Output --  Net 300 ml   Filed Weights   06/16/23 1120 06/17/23 0345   Weight: 88 kg 85.7 kg    Examination:  General exam: Overall comfortable, not in distress, obese, confused HEENT: PERRL Respiratory system:  no wheezes or crackles  Cardiovascular system: S1 & S2 heard, RRR.  Gastrointestinal system: Abdomen is nondistended, soft and nontender. Central nervous system: Awake but not oriented Extremities: No edema, no clubbing ,no cyanosis Skin: Sacral ulcer   Data Reviewed: I have personally reviewed following labs and imaging studies  CBC: Recent Labs  Lab 06/16/23 1218 06/16/23 1713 06/17/23 0134  WBC 16.5* 17.5* 17.2*  NEUTROABS 12.0*  --   --   HGB 7.7* 8.9* 8.3*  HCT 25.5* 30.3* 27.8*  MCV 97.7 103.1* 98.2  PLT 351 243 297   Basic Metabolic Panel: Recent Labs  Lab 06/16/23 1218 06/17/23 0134  NA 134* 132*  K 3.3* 3.7  CL 100 104  CO2 19*  19*  GLUCOSE 60* 52*  BUN 25* 26*  CREATININE 2.02* 1.89*  CALCIUM 8.2* 7.9*     Recent Results (from the past 240 hour(s))  MRSA Next Gen by PCR, Nasal     Status: None   Collection Time: 06/17/23  4:10 AM   Specimen: Nasal Mucosa; Nasal Swab  Result Value Ref Range Status   MRSA by PCR Next Gen NOT DETECTED NOT DETECTED Final    Comment: (NOTE) The GeneXpert MRSA Assay (FDA approved for NASAL specimens only), is one component of a comprehensive MRSA colonization surveillance program. It is not intended to diagnose MRSA infection nor to guide or monitor treatment for MRSA infections. Test performance is not FDA approved in patients less than 66 years old. Performed at [facility], [address] [place], [place] [address]      Radiology Studies: CT Head Wo Contrast  Result Date: 06/16/2023 CLINICAL DATA:  Headache, new onset (Age >= 51y) EXAM: CT HEAD WITHOUT CONTRAST TECHNIQUE: Contiguous axial images were obtained from the base of the skull through the vertex without intravenous contrast. RADIATION DOSE REDUCTION: This exam was performed according to the departmental  dose-optimization program which includes automated exposure control, adjustment of the mA and/or kV according to patient size and/or use of iterative reconstruction technique. COMPARISON:  CT scan head from 02/19/2023. FINDINGS: Examination is limited due to patient's motion during data acquisition. Only the gross findings are described as follows: Brain: No evidence of acute infarction, hemorrhage, hydrocephalus, extra-axial collection or mass lesion/mass effect. There is mild prominence of cerebral sulci due to age-related atrophy. There is bilateral periventricular hypodensity, which is non-specific but most likely seen in the settings of microvascular ischemic changes. Mild in extent. Vascular: No hyperdense vessel or unexpected calcification. Skull: Normal. Negative for fracture or focal lesion. Sinuses/Orbits: No acute finding. Other: None. IMPRESSION: *Limited exam. *No acute intracranial abnormality. Electronically Signed   By: Jules Schick M.D.   On: 06/16/2023 15:00   CT ABDOMEN PELVIS WO CONTRAST  Result Date: 06/16/2023 CLINICAL DATA:  Abdominal pain, acute, nonlocalized EXAM: CT ABDOMEN AND PELVIS WITHOUT CONTRAST TECHNIQUE: Multidetector CT imaging of the abdomen and pelvis was performed following the standard protocol without IV contrast. RADIATION DOSE REDUCTION: This exam was performed according to the departmental dose-optimization program which includes automated exposure control, adjustment of the mA and/or kV according to patient size and/or use of iterative reconstruction technique. COMPARISON:  CT scan abdomen and pelvis from 05/22/2020. FINDINGS: Examination is limited due to lack of intravenous contrast and patient's motion during data acquisition. Lower chest: There are patchy atelectatic changes in the visualized lung bases. No overt consolidation. No pleural effusion. The heart is normal in size. No pericardial effusion. Hepatobiliary: The liver is normal in size. Non-cirrhotic  configuration. No suspicious mass. No intrahepatic or extrahepatic bile duct dilation. Small volume layering tiny calcified gallstones noted gallstones. Normal gallbladder wall thickness. No pericholecystic inflammatory changes. Pancreas: Unremarkable. No pancreatic ductal dilatation or surrounding inflammatory changes. Spleen: Within normal limits. No focal lesion. Adrenals/Urinary Tract: Adrenal glands are unremarkable. No suspicious renal mass. There is a hypoattenuating structure in the left kidney interpolar region, anteriorly which exhibits tiny foci of dystrophic calcifications along the lateral aspect. This has decreased since the prior study from 2021 and may represent involuting slightly complex renal cyst. There is an additional hypoattenuating structure in the right kidney upper pole, laterally, which is incompletely characterized on the current examination but appears unchanged since the prior study. No  hydronephrosis. No renal or ureteric calculi. Unremarkable urinary bladder. Stomach/Bowel: There is a small sliding hiatal hernia. There are at least 2 diverticula arising from the distal duodenum. No disproportionate dilation of the small or large bowel loops. No evidence of abnormal bowel wall thickening or inflammatory changes. The appendix is unremarkable. There are multiple diverticula mainly in the left hemi colon, without imaging signs of diverticulitis. Vascular/Lymphatic: No ascites or pneumoperitoneum. No abdominal or pelvic lymphadenopathy, by size criteria. No aneurysmal dilation of the major abdominal arteries. There are mild peripheral atherosclerotic vascular calcifications of the aorta and its major branches. Reproductive: The uterus is surgically absent. No large adnexal mass. Other: Multiple midline periumbilical incisional hernias noted containing fat. There is a hernia along the superior aspect which exhibits herniation of portion of the transverse colon without obstruction. There is  a large decubitus ulcer over the left paramedian gluteal region with soft tissue defect of approximately 2.8 x 8.4 cm, reaching up to the coccyx. Musculoskeletal: No suspicious osseous lesions. There are moderate - marked multilevel degenerative changes in the visualized spine. IMPRESSION: 1. No acute inflammatory process identified within the abdomen or pelvis. 2. There is a large decubitus ulcer over the left paramedian gluteal region reaching up to the coccyx. 3. Multiple periumbilical midline incisional hernias with 1 hernia containing unobstructed portion of transverse colon. 4. Multiple other nonacute observations, as described above. Electronically Signed   By: Jules Schick M.D.   On: 06/16/2023 14:55   DG Chest Port 1 View  Result Date: 06/16/2023 CLINICAL DATA:  Questionable sepsis - evaluate for abnormality. EXAM: PORTABLE CHEST 1 VIEW COMPARISON:  02/19/2023. FINDINGS: Rotated patient. Low lung volumes accentuate the pulmonary vasculature and cardiomediastinal silhouette. No consolidation or pulmonary edema. No definite pleural effusion or pneumothorax, though patient's head obscures the right lung apex. IMPRESSION: Low lung volumes without evidence of acute cardiopulmonary disease. Electronically Signed   By: Orvan Falconer M.D.   On: 06/16/2023 12:46    Scheduled Meds:  atorvastatin  10 mg Oral QPM   Chlorhexidine Gluconate Cloth  6 each Topical Daily   dextrose       pantoprazole  20 mg Oral BID   sodium chloride flush  3 mL Intravenous Q12H   sucralfate  1 g Oral BID   vancomycin variable dose per unstable renal function (pharmacist dosing)   Does not apply See admin instructions   Continuous Infusions:  dextrose 75 mL/hr at 06/17/23 0837   meropenem (MERREM) IV Stopped (06/16/23 2340)     LOS: 1 day   Burnadette Pop, MD [facility] P7/24/2024, 8:47 AM

## 2023-06-17 NOTE — ED Notes (Signed)
ED TO INPATIENT HANDOFF REPORT  ED Nurse Name and Phone #: Rodney Booze [phone]  S Name/Age/Gender Lauren Santana 72 y.o. female Room/Bed: 039C/039C  Code Status   Code Status: Full Code  Home/SNF/Other Nursing Home ([facility]) Patient oriented to: altered mental status/Not alert to self,place, time, situation Is this baseline?  no  Triage Complete: Triage complete  Chief Complaint Acute encephalopathy [G93.40]  Triage Note Per [facility] pt coming from [facility] for altered mental status over past few days. States patient speaking gibberish onset this morning however was confused yesterday. [facility] state staff put pt on oxygen but patient was never hypoxic. Patient has sacral wound.    Allergies Allergies  Allergen Reactions   Tylenol [Acetaminophen] Other (See Comments)    States "seems like eats my stomach"   Sulfa Antibiotics Other (See Comments)    Unknown allergic reaction    Level of Care/Admitting Diagnosis ED Disposition     ED Disposition  Admit   Condition  --   Comment  Hospital Area: [facility] [[id number]]  Level of Care: Progressive [102]  Admit to Progressive based on following criteria: MULTISYSTEM THREATS such as stable sepsis, metabolic/electrolyte imbalance with or without encephalopathy that is responding to early treatment.  Admit to Progressive based on following criteria: Other see comments  Comments: Concern for devloping sepsis, ?intermittent bleeding vs other for Anemia  May admit patient to [facility] or [facility] if equivalent level of care is available:: No  Covid Evaluation: Asymptomatic - no recent exposure (last 10 days) testing not required  Diagnosis: Acute encephalopathy [098119]  Admitting Physician: Synetta Fail [[id number]]  Attending Physician: Synetta Fail [phone]  Certification:: I certify this patient will need inpatient services for at least 2 midnights  Estimated Length of Stay: 2           B Medical/Surgery History Past Medical History:  Diagnosis Date   Acute deep vein thrombosis (DVT) of right lower extremity (HCC) 02/14/2016   Arthritis    Edema    B/LLE   Endometriosis    High cholesterol    Hypertension    Numbness    feet   Pre-diabetes    Pulmonary emboli (HCC) 02/13/2016   Pulmonary embolism (HCC)    recurrent, second episode in 06/2020   Pulmonary embolism with acute cor pulmonale (HCC)    Walker as ambulation aid    Wears dentures    Wears glasses    Past Surgical History:  Procedure Laterality Date   ABDOMINAL HYSTERECTOMY     and BSO   CATARACT EXTRACTION Left 10/2018   CESAREAN SECTION  1969, 1971   COLONOSCOPY     EYE SURGERY Bilateral    CATARACT SX   HERNIA REPAIR  2001   incisional   IR CATHETER TUBE CHANGE  10/12/2020   IR CATHETER TUBE CHANGE  12/07/2020   IR CHOLANGIOGRAM EXISTING TUBE  09/28/2020   IR PERC CHOLECYSTOSTOMY  08/17/2020   IR RADIOLOGIST EVAL & MGMT  11/22/2020   JOINT REPLACEMENT Left    Knee   MAXILLARY ANTROSTOMY Right 01/20/2020   Procedure: MAXILLARY ANTROSTOMY  WITH BIOPSY CALDWELL APPROACH;  Surgeon: Osborn Coho, MD;  Location: [facility] OR;  Service: ENT;  Laterality: Right;   MULTIPLE TOOTH EXTRACTIONS  2011   NASAL ENDOSCOPY Right 02/17/2020   Procedure: NASAL ENDOSCOPY WITH RIGHT INFERIOR TURNBINATE REDUCTION;  Surgeon: Osborn Coho, MD;  Location: [facility] OR;  Service: ENT;  Laterality: Right;   neck mass removal     "  fatty tissue, not cancerous" per pt's son   SINUS ENDO WITH FUSION Right 01/20/2020   Procedure: SINUS ENDO WITH FUSION WITH BIOSPY;  Surgeon: Osborn Coho, MD;  Location: [facility] OR;  Service: ENT;  Laterality: Right;   TOTAL KNEE ARTHROPLASTY Left 07/17/2014   Procedure: TOTAL KNEE ARTHROPLASTY;  Surgeon: Nestor Lewandowsky, MD;  Location: [facility] OR;  Service: Orthopedics;  Laterality: Left;     A IV Location/Drains/Wounds Patient Lines/Drains/Airways Status     Active Line/Drains/Airways     Name  Placement date Placement time Site Days   Peripheral IV 06/16/23 20 G 2.5" Anterior;Right Forearm 06/16/23  1244  Forearm  1   Midline Single Lumen Left --  --  --  --   Biliary Tube Cook slip-coat 12 Fr. RUQ 12/07/20  1344  RUQ  922   Urethral Catheter turnbow,j Double-lumen 14 Fr. 06/16/23  2030  Double-lumen  1   Pressure Injury 08/16/20 Buttocks Anterior;Upper Stage 2 -  Partial thickness loss of dermis presenting as a shallow open injury with a red, pink wound bed without slough. 08/16/20  2130  -- 1035            Intake/Output Last 24 hours  Intake/Output Summary (Last 24 hours) at 06/17/2023 0207 Last data filed at 06/16/2023 2340 Gross per 24 hour  Intake 300 ml  Output --  Net 300 ml    Labs/Imaging Results for orders placed or performed during the hospital encounter of 06/16/23 (from the past 48 hour(s))  Comprehensive metabolic panel     Status: Abnormal   Collection Time: 06/16/23 12:18 PM  Result Value Ref Range   Sodium 134 (L) 135 - 145 mmol/L   Potassium 3.3 (L) 3.5 - 5.1 mmol/L   Chloride 100 98 - 111 mmol/L   CO2 19 (L) 22 - 32 mmol/L   Glucose, Bld 60 (L) 70 - 99 mg/dL    Comment: Glucose reference range applies only to samples taken after fasting for at least 8 hours.   BUN 25 (H) 8 - 23 mg/dL   Creatinine, Ser 1.61 (H) 0.44 - 1.00 mg/dL   Calcium 8.2 (L) 8.9 - 10.3 mg/dL   Total Protein 5.6 (L) 6.5 - 8.1 g/dL   Albumin 1.5 (L) 3.5 - 5.0 g/dL   AST 30 15 - 41 U/L   ALT 13 0 - 44 U/L   Alkaline Phosphatase 103 38 - 126 U/L   Total Bilirubin 1.0 0.3 - 1.2 mg/dL   GFR, Estimated 26 (L) >60 mL/min    Comment: (NOTE) Calculated using the CKD-EPI Creatinine Equation (2021)    Anion gap 15 5 - 15    Comment: Performed at [facility], [address] [place], [place] [address]  CBC with Differential     Status: Abnormal   Collection Time: 06/16/23 12:18 PM  Result Value Ref Range   WBC 16.5 (H) 4.0 - 10.5 K/uL   RBC 2.61 (L) 3.87 - 5.11 MIL/uL    Hemoglobin 7.7 (L) 12.0 - 15.0 g/dL   HCT 54.0 (L) 98.1 - 19.1 %   MCV 97.7 80.0 - 100.0 fL   MCH 29.5 26.0 - 34.0 pg   MCHC 30.2 30.0 - 36.0 g/dL   RDW 47.8 (H) 29.5 - 62.1 %   Platelets 351 150 - 400 K/uL   nRBC 0.1 0.0 - 0.2 %   Neutrophils Relative % 73 %   Neutro Abs 12.0 (H) 1.7 - 7.7 K/uL   Lymphocytes  Relative 16 %   Lymphs Abs 2.6 0.7 - 4.0 K/uL   Monocytes Relative 8 %   Monocytes Absolute 1.3 (H) 0.1 - 1.0 K/uL   Eosinophils Relative 1 %   Eosinophils Absolute 0.1 0.0 - 0.5 K/uL   Basophils Relative 0 %   Basophils Absolute 0.1 0.0 - 0.1 K/uL   Immature Granulocytes 2 %   Abs Immature Granulocytes 0.40 (H) 0.00 - 0.07 K/uL    Comment: Performed at [facility], [address] [place], [place] [address]  Protime-INR     Status: Abnormal   Collection Time: 06/16/23 12:18 PM  Result Value Ref Range   Prothrombin Time 26.3 (H) 11.4 - 15.2 seconds   INR 2.4 (H) 0.8 - 1.2    Comment: (NOTE) INR goal varies based on device and disease states. Performed at [facility], [address], [place], [place] [address]   APTT     Status: Abnormal   Collection Time: 06/16/23 12:18 PM  Result Value Ref Range   aPTT 47 (H) 24 - 36 seconds    Comment:        IF BASELINE aPTT IS ELEVATED, SUGGEST PATIENT RISK ASSESSMENT BE USED TO DETERMINE APPROPRIATE ANTICOAGULANT THERAPY. Performed at [facility], [address], [place], [place] [address]   I-Stat Lactic Acid, ED     Status: None   Collection Time: 06/16/23 12:25 PM  Result Value Ref Range   Lactic Acid, Venous 1.3 0.5 - 1.9 mmol/L  POC occult blood, ED     Status: None   Collection Time: 06/16/23  1:31 PM  Result Value Ref Range   Fecal Occult Bld NEGATIVE NEGATIVE  Vancomycin, random     Status: Abnormal   Collection Time: 06/16/23  5:13 PM  Result Value Ref Range   Vancomycin Rm >60 (HH) ug/mL    Comment: CRITICAL RESULT CALLED TO, READ BACK BY AND VERIFIED WITH Idamae Lusher, RN AT 1806 07.23.24 D.  BLU        Random Vancomycin therapeutic range is dependent on dosage and time of specimen collection. A peak range is 20-40 ug/mL A trough range is 5-15 ug/mL        Performed at [facility], [address] [place], [place] [address]   CBC     Status: Abnormal   Collection Time: 06/16/23  5:13 PM  Result Value Ref Range   WBC 17.5 (H) 4.0 - 10.5 K/uL   RBC 2.94 (L) 3.87 - 5.11 MIL/uL   Hemoglobin 8.9 (L) 12.0 - 15.0 g/dL   HCT 65.7 (L) 84.6 - 96.2 %   MCV 103.1 (H) 80.0 - 100.0 fL   MCH 30.3 26.0 - 34.0 pg   MCHC 29.4 (L) 30.0 - 36.0 g/dL   RDW 95.2 (H) 84.1 - 32.4 %   Platelets 243 150 - 400 K/uL   nRBC 0.1 0.0 - 0.2 %    Comment: Performed at [facility], [address], [place], [place] [address]  Type and screen [facility]     Status: None   Collection Time: 06/16/23  5:13 PM  Result Value Ref Range   ABO/RH(D) O POS    Antibody Screen NEG    Sample Expiration      06/19/2023,2359 Performed at [facility], [address] [place], [place] [address]   Ferritin     Status: Abnormal   Collection Time: 06/16/23  5:13  PM  Result Value Ref Range   Ferritin 1,021 (H) 11 - 307 ng/mL    Comment: Performed at [facility], [address], [place], [place] [address]  Iron and TIBC     Status: None   Collection Time: 06/16/23  5:13 PM  Result Value Ref Range   Iron 91 28 - 170 ug/dL   TIBC NOT CALCULATED 130 - 450 ug/dL   Saturation Ratios NOT CALCULATED 10.4 - 31.8 %   UIBC NOT CALCULATED ug/dL    Comment: Performed at [facility], [address] [place], [place] [address]  Urinalysis, w/ Reflex to Culture (Infection Suspected) -Urine, Clean Catch     Status: Abnormal   Collection Time: 06/16/23  9:08 PM  Result Value Ref Range   Specimen Source URINE, CLEAN CATCH    Color, Urine AMBER (A) YELLOW    Comment: BIOCHEMICALS MAY BE AFFECTED BY COLOR   APPearance CLOUDY (A) CLEAR   Specific Gravity, Urine 1.017 1.005 -  1.030   pH 5.0 5.0 - 8.0   Glucose, UA NEGATIVE NEGATIVE mg/dL   Hgb urine dipstick SMALL (A) NEGATIVE   Bilirubin Urine NEGATIVE NEGATIVE   Ketones, ur NEGATIVE NEGATIVE mg/dL   Protein, ur NEGATIVE NEGATIVE mg/dL   Nitrite NEGATIVE NEGATIVE   Leukocytes,Ua NEGATIVE NEGATIVE   RBC / HPF 0-5 0 - 5 RBC/hpf   WBC, UA 0-5 0 - 5 WBC/hpf    Comment:        Reflex urine culture not performed if WBC <=10, OR if Squamous epithelial cells >5. If Squamous epithelial cells >5 suggest recollection.    Bacteria, UA RARE (A) NONE SEEN   Squamous Epithelial / HPF 0-5 0 - 5 /HPF    Comment: Performed at [facility], [address], [place], [place] [address]   CT Head Wo Contrast  Result Date: 06/16/2023 CLINICAL DATA:  Headache, new onset (Age >= 51y) EXAM: CT HEAD WITHOUT CONTRAST TECHNIQUE: Contiguous axial images were obtained from the base of the skull through the vertex without intravenous contrast. RADIATION DOSE REDUCTION: This exam was performed according to the departmental dose-optimization program which includes automated exposure control, adjustment of the mA and/or kV according to patient size and/or use of iterative reconstruction technique. COMPARISON:  CT scan head from 02/19/2023. FINDINGS: Examination is limited due to patient's motion during data acquisition. Only the gross findings are described as follows: Brain: No evidence of acute infarction, hemorrhage, hydrocephalus, extra-axial collection or mass lesion/mass effect. There is mild prominence of cerebral sulci due to age-related atrophy. There is bilateral periventricular hypodensity, which is non-specific but most likely seen in the settings of microvascular ischemic changes. Mild in extent. Vascular: No hyperdense vessel or unexpected calcification. Skull: Normal. Negative for fracture or focal lesion. Sinuses/Orbits: No acute finding. Other: None. IMPRESSION: *Limited exam. *No acute intracranial abnormality. Electronically  Signed   By: Jules Schick M.D.   On: 06/16/2023 15:00   CT ABDOMEN PELVIS WO CONTRAST  Result Date: 06/16/2023 CLINICAL DATA:  Abdominal pain, acute, nonlocalized EXAM: CT ABDOMEN AND PELVIS WITHOUT CONTRAST TECHNIQUE: Multidetector CT imaging of the abdomen and pelvis was performed following the standard protocol without IV contrast. RADIATION DOSE REDUCTION: This exam was performed according to the departmental dose-optimization program which includes automated exposure control, adjustment of the mA and/or kV according to patient size and/or use of iterative reconstruction technique. COMPARISON:  CT scan abdomen and pelvis from 05/22/2020. FINDINGS: Examination is limited due to  lack of intravenous contrast and patient's motion during data acquisition. Lower chest: There are patchy atelectatic changes in the visualized lung bases. No overt consolidation. No pleural effusion. The heart is normal in size. No pericardial effusion. Hepatobiliary: The liver is normal in size. Non-cirrhotic configuration. No suspicious mass. No intrahepatic or extrahepatic bile duct dilation. Small volume layering tiny calcified gallstones noted gallstones. Normal gallbladder wall thickness. No pericholecystic inflammatory changes. Pancreas: Unremarkable. No pancreatic ductal dilatation or surrounding inflammatory changes. Spleen: Within normal limits. No focal lesion. Adrenals/Urinary Tract: Adrenal glands are unremarkable. No suspicious renal mass. There is a hypoattenuating structure in the left kidney interpolar region, anteriorly which exhibits tiny foci of dystrophic calcifications along the lateral aspect. This has decreased since the prior study from 2021 and may represent involuting slightly complex renal cyst. There is an additional hypoattenuating structure in the right kidney upper pole, laterally, which is incompletely characterized on the current examination but appears unchanged since the prior study. No  hydronephrosis. No renal or ureteric calculi. Unremarkable urinary bladder. Stomach/Bowel: There is a small sliding hiatal hernia. There are at least 2 diverticula arising from the distal duodenum. No disproportionate dilation of the small or large bowel loops. No evidence of abnormal bowel wall thickening or inflammatory changes. The appendix is unremarkable. There are multiple diverticula mainly in the left hemi colon, without imaging signs of diverticulitis. Vascular/Lymphatic: No ascites or pneumoperitoneum. No abdominal or pelvic lymphadenopathy, by size criteria. No aneurysmal dilation of the major abdominal arteries. There are mild peripheral atherosclerotic vascular calcifications of the aorta and its major branches. Reproductive: The uterus is surgically absent. No large adnexal mass. Other: Multiple midline periumbilical incisional hernias noted containing fat. There is a hernia along the superior aspect which exhibits herniation of portion of the transverse colon without obstruction. There is a large decubitus ulcer over the left paramedian gluteal region with soft tissue defect of approximately 2.8 x 8.4 cm, reaching up to the coccyx. Musculoskeletal: No suspicious osseous lesions. There are moderate - marked multilevel degenerative changes in the visualized spine. IMPRESSION: 1. No acute inflammatory process identified within the abdomen or pelvis. 2. There is a large decubitus ulcer over the left paramedian gluteal region reaching up to the coccyx. 3. Multiple periumbilical midline incisional hernias with 1 hernia containing unobstructed portion of transverse colon. 4. Multiple other nonacute observations, as described above. Electronically Signed   By: Jules Schick M.D.   On: 06/16/2023 14:55   DG Chest Port 1 View  Result Date: 06/16/2023 CLINICAL DATA:  Questionable sepsis - evaluate for abnormality. EXAM: PORTABLE CHEST 1 VIEW COMPARISON:  02/19/2023. FINDINGS: Rotated patient. Low lung  volumes accentuate the pulmonary vasculature and cardiomediastinal silhouette. No consolidation or pulmonary edema. No definite pleural effusion or pneumothorax, though patient's head obscures the right lung apex. IMPRESSION: Low lung volumes without evidence of acute cardiopulmonary disease. Electronically Signed   By: Orvan Falconer M.D.   On: 06/16/2023 12:46    Pending Labs Unresulted Labs (From admission, onward)     Start     Ordered   06/17/23 0500  Comprehensive metabolic panel  Tomorrow morning,   R        06/16/23 1545   06/17/23 0500  CBC  Tomorrow morning,   R        06/16/23 1545   06/16/23 1646  Reticulocytes  (Anemia Panel ([facility]))  Add-on,   AD        06/16/23 1645   06/16/23 1646  Vitamin B12  (  Anemia Panel ([facility]L))  Add-on,   AD        06/16/23 1645   06/16/23 1645  Folate  (Anemia Panel (PNL))  Add-on,   AD        06/16/23 1645   06/16/23 1124  Blood Culture (routine x 2)  (Undifferentiated presentation (screening labs and basic nursing orders))  BLOOD CULTURE X 2,   STAT      06/16/23 1124            Vitals/Pain Today's Vitals   06/16/23 1915 06/16/23 2003 06/16/23 2145 06/17/23 0050  BP:  109/69 111/84 115/63  Pulse: 94 94 96 (!) 104  Resp: 20 (!) 26 18 20   Temp:  97.8 F (36.6 C)  (!) 97.4 F (36.3 C)  TempSrc:  Axillary  Oral  SpO2: 100% 100% 99% 100%  Weight:      Height:        Isolation Precautions No active isolations  Medications Medications  vancomycin variable dose per unstable renal function (pharmacist dosing) (has no administration in time range)  atorvastatin (LIPITOR) tablet 10 mg (has no administration in time range)  pantoprazole (PROTONIX) EC tablet 20 mg (has no administration in time range)  sucralfate (CARAFATE) 1 GM/10ML suspension 1 g (1 g Oral Given 06/16/23 2150)  cyanocobalamin (VITAMIN B12) tablet 500 mcg (has no administration in time range)  sodium chloride flush (NS) 0.9 % injection 3 mL (3 mLs Intravenous Given 06/16/23  2150)  acetaminophen (TYLENOL) tablet 650 mg (has no administration in time range)    Or  acetaminophen (TYLENOL) suppository 650 mg (has no administration in time range)  polyethylene glycol (MIRALAX / GLYCOLAX) packet 17 g (has no administration in time range)  lactated ringers infusion ( Intravenous New Bag/Given 06/16/23 1924)  meropenem (MERREM) 1 g in sodium chloride 0.9 % 100 mL IVPB (0 g Intravenous Stopped 06/16/23 2340)  fentaNYL (SUBLIMAZE) injection 50 mcg (50 mcg Intravenous Given 06/16/23 1300)  lactated ringers bolus 2,000 mL (0 mLs Intravenous Stopped 06/16/23 1744)  dextrose 50 % solution 50 mL (50 mLs Intravenous Given 06/16/23 1426)  potassium chloride 10 mEq in 100 mL IVPB (0 mEq Intravenous Stopped 06/16/23 2307)    Mobility non-ambulatory     Focused Assessments Cardiac Assessment Handoff:  Cardiac Rhythm: Normal sinus rhythm Lab Results  Component Value Date   TROPONINI 0.03 02/13/2016   Lab Results  Component Value Date   DDIMER >20.00 (H) 02/12/2016   Does the Patient currently have chest pain? No    R Recommendations: See Admitting Provider Note  Report given to:   Additional Notes:

## 2023-06-17 NOTE — Evaluation (Signed)
Clinical/Bedside Swallow Evaluation Patient Details  Name: Lauren Santana MRN: [id number] Date of Birth: 02/04/1951  Today's Date: 06/17/2023 Time: SLP Start Time (ACUTE ONLY): 1337 SLP Stop Time (ACUTE ONLY): 1353 SLP Time Calculation (min) (ACUTE ONLY): 16 min  Past Medical History:  Past Medical History:  Diagnosis Date   Acute deep vein thrombosis (DVT) of right lower extremity (HCC) 02/14/2016   Arthritis    Edema    B/LLE   Endometriosis    High cholesterol    Hypertension    Numbness    feet   Pre-diabetes    Pulmonary emboli (HCC) 02/13/2016   Pulmonary embolism (HCC)    recurrent, second episode in 06/2020   Pulmonary embolism with acute cor pulmonale (HCC)    Walker as ambulation aid    Wears dentures    Wears glasses    Past Surgical History:  Past Surgical History:  Procedure Laterality Date   ABDOMINAL HYSTERECTOMY     and BSO   CATARACT EXTRACTION Left 10/2018   CESAREAN SECTION  1969, 1971   COLONOSCOPY     EYE SURGERY Bilateral    CATARACT SX   HERNIA REPAIR  2001   incisional   IR CATHETER TUBE CHANGE  10/12/2020   IR CATHETER TUBE CHANGE  12/07/2020   IR CHOLANGIOGRAM EXISTING TUBE  09/28/2020   IR PERC CHOLECYSTOSTOMY  08/17/2020   IR RADIOLOGIST EVAL & MGMT  11/22/2020   JOINT REPLACEMENT Left    Knee   MAXILLARY ANTROSTOMY Right 01/20/2020   Procedure: MAXILLARY ANTROSTOMY  WITH BIOPSY CALDWELL APPROACH;  Surgeon: Osborn Coho, MD;  Location: [facility] OR;  Service: ENT;  Laterality: Right;   MULTIPLE TOOTH EXTRACTIONS  2011   NASAL ENDOSCOPY Right 02/17/2020   Procedure: NASAL ENDOSCOPY WITH RIGHT INFERIOR TURNBINATE REDUCTION;  Surgeon: Osborn Coho, MD;  Location: [facility] OR;  Service: ENT;  Laterality: Right;   neck mass removal     "fatty tissue, not cancerous" per pt's son   SINUS ENDO WITH FUSION Right 01/20/2020   Procedure: SINUS ENDO WITH FUSION WITH BIOSPY;  Surgeon: Osborn Coho, MD;  Location: [facility] OR;  Service: ENT;  Laterality:  Right;   TOTAL KNEE ARTHROPLASTY Left 07/17/2014   Procedure: TOTAL KNEE ARTHROPLASTY;  Surgeon: Nestor Lewandowsky, MD;  Location: [facility] OR;  Service: Orthopedics;  Laterality: Left;   HPI:  72 y.o. female presented from [facility] due to AMS. Dx acute encephalopathy, large decubitus ulcer, hypoglycemia, severe protein calorie malnutrition. PMHx hypertension, hyperlipidemia, obesity, atrial fibrillation, anemia, diffuse large cell B lymphoma, PE, chronic back pain    Assessment / Plan / Recommendation  Clinical Impression  Pt babbled what appeared to be a prayer throughout the entirety of the session and did not respond to cueing to cease talking to focus on swallowing. She had difficulty following commands, making oral motor function DTA. SLP provided throrough oral care before providing trials of ice chips, thin liquids via straw and teaspoon, and small teaspoonfuls of puree. She had no overt s/s of aspiration, but significantly reduced awareness of bolus presentation leading to anterior spillage. This is primarily due to her constant verbalizations. Limited trials of purees resulted in oral residue and eventual anterior spillage. At this time pt does not demonstrate a level of awareness necessary for a PO diet. Recommend she remain NPO. SLP will f/u as able to assess readiness for further PO trials. SLP Visit Diagnosis: Dysphagia, unspecified (R13.10)    Aspiration Risk  Moderate aspiration risk  Diet Recommendation NPO    Medication Administration: Via alternative means    Other  Recommendations Oral Care Recommendations: Oral care QID    Recommendations for follow up therapy are one component of a multi-disciplinary discharge planning process, led by the attending physician.  Recommendations may be updated based on patient status, additional functional criteria and insurance authorization.  Follow up Recommendations Skilled nursing-short term rehab (<3 hours/day)      Assistance  Recommended at Discharge    Functional Status Assessment Patient has had a recent decline in their functional status and demonstrates the ability to make significant improvements in function in a reasonable and predictable amount of time.  Frequency and Duration min 2x/week  2 weeks       Prognosis Prognosis for improved oropharyngeal function: Good Barriers to Reach Goals: Cognitive deficits      Swallow Study   General HPI: 72 y.o. female presented from [facility] due to AMS. Dx acute encephalopathy, large decubitus ulcer, hypoglycemia, severe protein calorie malnutrition. PMHx hypertension, hyperlipidemia, obesity, atrial fibrillation, anemia, diffuse large cell B lymphoma, PE, chronic back pain Type of Study: Bedside Swallow Evaluation Previous Swallow Assessment: none in chart Diet Prior to this Study: NPO Temperature Spikes Noted: No Respiratory Status: Room air History of Recent Intubation: No Behavior/Cognition: Alert;Lethargic/Drowsy;Distractible;Requires cueing;Doesn't follow directions Oral Cavity Assessment: Within Functional Limits Oral Care Completed by SLP: Yes Oral Cavity - Dentition: Edentulous Vision: Functional for self-feeding Self-Feeding Abilities: Total assist Patient Positioning: Upright in bed Baseline Vocal Quality: Normal Volitional Cough: Cognitively unable to elicit Volitional Swallow: Unable to elicit    Oral/Motor/Sensory Function Overall Oral Motor/Sensory Function: Other (comment) (DTA due to trouble following commands)   Ice Chips Ice chips: Impaired Presentation: Spoon Oral Phase Impairments: Reduced labial seal;Poor awareness of bolus Oral Phase Functional Implications: Oral residue;Oral holding;Right anterior spillage   Thin Liquid Thin Liquid: Impaired Presentation: Straw;Spoon Oral Phase Impairments: Reduced labial seal;Poor awareness of bolus Oral Phase Functional Implications: Right anterior spillage;Oral holding Pharyngeal   Phase Impairments: Suspected delayed Swallow    Nectar Thick     Honey Thick     Puree Puree: Impaired Presentation: Spoon Oral Phase Impairments: Poor awareness of bolus;Reduced labial seal Oral Phase Functional Implications: Oral residue;Oral holding   Solid            Gwynneth Aliment, M.A., CF-SLP Speech Language Pathology, Acute Rehabilitation Services  Secure Chat preferred [phone]  06/17/2023,2:07 PM

## 2023-06-17 NOTE — Progress Notes (Signed)
Hypoglycemic Event  CBG: 45  - 08:22  Treatment: D50 25 mL (12.5 gm)  Symptoms: Nervous/irritable  Follow-up CBG: Time: 08:47 CBG Result: 94  Possible Reasons for Event: Inadequate meal intake and Change in activity  Comments/MD notified:Amrit Adhikari, MD notified. Ordered D5 and started at 62mL/hr at 08:37.  Changed to D5LR @ 135mL/hr. Will start once returns from MRI.  Q6 Blood Sugar checks ordered.   Juluis Mire

## 2023-06-17 NOTE — Progress Notes (Signed)
Pharmacy Antibiotic Note Lauren Santana is a 72 y.o. female for which pharmacy has been consulted for meropenem and vancomycin dosing for  wound infection ; cefepime discontinued given concern for neurotoxicity.  Patient with a history of HTN, HLD, pre-DM, PE. Patient presenting with AMS.  Pt receiving cefepime and vancomycin at [facility]. Has been on since 7/13. Per facility, last doses were 7/22.  Outpt Abx: Cefepime 1g q12h Vancomycin 1q12h Start 7/13: Duration 6 weeks  Random vancomycin level > 60 and supratherapeutic on 7/23. SCr down to 1.89 (baseline 0.54 in 02/2023). WBC 17.2 and afebrile.   Plan: Continue to hold vancomycin Repeat vancomycin random level in am Continue meropenem 1 g IV q12h  Monitor WBC, fever, renal function, cultures De-escalate when able F/u ID recommendations  Height: 5\' 2"  (157.5 cm) Weight: 85.7 kg (188 lb 15 oz) IBW/kg (Calculated) : 50.1  Temp (24hrs), Avg:97.8 F (36.6 C), Min:97.4 F (36.3 C), Max:98.5 F (36.9 C)  Recent Labs  Lab 06/16/23 1218 06/16/23 1225 06/16/23 1713 06/17/23 0134  WBC 16.5*  --  17.5* 17.2*  CREATININE 2.02*  --   --  1.89*  LATICACIDVEN  --  1.3  --   --   VANCORANDOM  --   --  >60*  --     Estimated Creatinine Clearance: 26.9 mL/min (A) (by C-G formula based on SCr of 1.89 mg/dL (H)).    Allergies  Allergen Reactions   Tylenol [Acetaminophen] Other (See Comments)    States "seems like eats my stomach"   Sulfa Antibiotics Other (See Comments)    Unknown allergic reaction    Antimicrobials this admission: Vanc 7/13 >> Cefepime 7/13 >> 7/22 Meropenem 7/23 >>   Microbiology results: 7/23 BCx2: ngtd 7/24 MRSA PCR neg  Thank you for involving pharmacy in this patient's care.  Loura Back, PharmD, BCPS Clinical Pharmacist Clinical phone for 06/17/2023 is [phone] 06/17/2023 1:43 PM

## 2023-06-17 NOTE — Consult Note (Addendum)
WOC Nurse Consult Note: Reason for Consult: Consult requested for sacrum.  Wound is actually to left ischium.  Chronic Stage 4 pressure injury 5X13X3cm with 2 cm undermining, mod amt tan drainage, 80% red, 10% yellow, 10% black at the edges. Bone palpable when probed with a swab.  Pt is frequently incontinent of stool and it is difficult to prevent the wound from becoming soiled.  Middle back with several full thickness wounds; .5X.5X.1cm, 1X1X.1cm, 1X4X.2cm; all are red and moist Left heel with darker colored Deep tissue pressure injury 1X1cm Right heel with darker colored Deep tissue pressure injury; 2X1cm Pressure Injury POA: Yes Dressing procedure/placement/frequency: Float bilat heels in Prevalon boots to reduce pressure. Topical treatment orders provided for bedside nurses to perform as follows:  1. Apply moist gauze packing to left ischium wound Q day, using swab to fill, then cover with foam dressing.  Change foam dressing Q 3 days or PRN soiling. 2. Foam dressings to wounds on middle back, change Q 3 days or PRN soiling. Please re-consult if further assistance is needed.  Thank-you,  Cammie Mcgee MSN, RN, CWOCN, [place], CNS [phone]

## 2023-06-18 ENCOUNTER — Inpatient Hospital Stay (HOSPITAL_COMMUNITY): Payer: 59

## 2023-06-18 DIAGNOSIS — N179 Acute kidney failure, unspecified: Secondary | ICD-10-CM | POA: Diagnosis not present

## 2023-06-18 DIAGNOSIS — S31829A Unspecified open wound of left buttock, initial encounter: Secondary | ICD-10-CM

## 2023-06-18 DIAGNOSIS — G9340 Encephalopathy, unspecified: Secondary | ICD-10-CM | POA: Diagnosis not present

## 2023-06-18 LAB — GLUCOSE, CAPILLARY
Glucose-Capillary: 103 mg/dL — ABNORMAL HIGH (ref 70–99)
Glucose-Capillary: 108 mg/dL — ABNORMAL HIGH (ref 70–99)
Glucose-Capillary: 112 mg/dL — ABNORMAL HIGH (ref 70–99)
Glucose-Capillary: 120 mg/dL — ABNORMAL HIGH (ref 70–99)
Glucose-Capillary: 159 mg/dL — ABNORMAL HIGH (ref 70–99)
Glucose-Capillary: 63 mg/dL — ABNORMAL LOW (ref 70–99)
Glucose-Capillary: 81 mg/dL (ref 70–99)
Glucose-Capillary: 84 mg/dL (ref 70–99)

## 2023-06-18 LAB — BASIC METABOLIC PANEL
Anion gap: 12 (ref 5–15)
BUN: 29 mg/dL — ABNORMAL HIGH (ref 8–23)
CO2: 17 mmol/L — ABNORMAL LOW (ref 22–32)
Calcium: 7.9 mg/dL — ABNORMAL LOW (ref 8.9–10.3)
Chloride: 104 mmol/L (ref 98–111)
Creatinine, Ser: 2.04 mg/dL — ABNORMAL HIGH (ref 0.44–1.00)
GFR, Estimated: 25 mL/min — ABNORMAL LOW (ref >60)
Glucose, Bld: 99 mg/dL (ref 70–99)
Potassium: 3.6 mmol/L (ref 3.5–5.1)
Sodium: 133 mmol/L — ABNORMAL LOW (ref 135–145)

## 2023-06-18 LAB — CBC
HCT: 23.3 % — ABNORMAL LOW (ref 36.0–46.0)
Hemoglobin: 7.2 g/dL — ABNORMAL LOW (ref 12.0–15.0)
MCH: 29.4 pg (ref 26.0–34.0)
MCHC: 30.9 g/dL (ref 30.0–36.0)
MCV: 95.1 fL (ref 80.0–100.0)
Platelets: 249 10*3/uL (ref 150–400)
RDW: 15.9 % — ABNORMAL HIGH (ref 11.5–15.5)
WBC: 16.3 10*3/uL — ABNORMAL HIGH (ref 4.0–10.5)
nRBC: 0.3 % — ABNORMAL HIGH (ref 0.0–0.2)

## 2023-06-18 LAB — CULTURE, BLOOD (ROUTINE X 2): Culture: NO GROWTH

## 2023-06-18 LAB — VANCOMYCIN, RANDOM: Vancomycin Rm: 47 ug/mL

## 2023-06-18 MED ORDER — SODIUM CHLORIDE 0.9 % IV SOLN
3.0000 g | Freq: Two times a day (BID) | INTRAVENOUS | Status: DC
  Administered 2023-06-18 – 2023-06-19 (×2): 3 g via INTRAVENOUS
  Filled 2023-06-18 (×2): qty 8

## 2023-06-18 MED ORDER — APIXABAN 5 MG PO TABS
5.0000 mg | ORAL_TABLET | Freq: Two times a day (BID) | ORAL | Status: DC
  Administered 2023-06-18 – 2023-06-19 (×3): 5 mg via ORAL
  Filled 2023-06-18 (×3): qty 1

## 2023-06-18 MED ORDER — DEXTROSE 5 % IV SOLN: INTRAVENOUS | Status: DC

## 2023-06-18 MED ORDER — FUROSEMIDE 10 MG/ML IJ SOLN
60.0000 mg | Freq: Two times a day (BID) | INTRAMUSCULAR | Status: DC
  Administered 2023-06-18 – 2023-06-19 (×3): 60 mg via INTRAVENOUS
  Filled 2023-06-18 (×3): qty 8

## 2023-06-18 MED ORDER — NALOXONE HCL 0.4 MG/ML IJ SOLN
0.4000 mg | Freq: Once | INTRAMUSCULAR | Status: AC
  Administered 2023-06-18: 0.4 mg via INTRAVENOUS
  Filled 2023-06-18: qty 1

## 2023-06-18 MED ORDER — DEXTROSE 50 % IV SOLN
25.0000 g | INTRAVENOUS | Status: DC | PRN
  Administered 2023-06-18: 25 g via INTRAVENOUS
  Filled 2023-06-18: qty 50

## 2023-06-18 MED ORDER — PANTOPRAZOLE SODIUM 40 MG IV SOLR
40.0000 mg | Freq: Every day | INTRAVENOUS | Status: DC
  Administered 2023-06-18 – 2023-06-19 (×2): 40 mg via INTRAVENOUS
  Filled 2023-06-18 (×2): qty 10

## 2023-06-18 MED ORDER — ENSURE ENLIVE PO LIQD: 237.0000 mL | Freq: Two times a day (BID) | ORAL | Status: DC

## 2023-06-18 MED ORDER — SODIUM CHLORIDE 0.9 % IV SOLN: INTRAVENOUS | Status: DC | PRN

## 2023-06-18 MED ORDER — LACTATED RINGERS IV BOLUS
1000.0000 mL | Freq: Once | INTRAVENOUS | Status: AC
  Administered 2023-06-18: 1000 mL via INTRAVENOUS

## 2023-06-18 MED ORDER — ADULT MULTIVITAMIN W/MINERALS CH
1.0000 | ORAL_TABLET | Freq: Every day | ORAL | Status: DC
  Administered 2023-06-18 – 2023-06-19 (×2): 1 via ORAL
  Filled 2023-06-18 (×2): qty 1

## 2023-06-18 MED ORDER — JUVEN PO PACK
1.0000 | PACK | Freq: Two times a day (BID) | ORAL | Status: DC
  Administered 2023-06-19: 1 via ORAL
  Filled 2023-06-18: qty 1

## 2023-06-18 NOTE — Progress Notes (Signed)
Initial Nutrition Assessment  DOCUMENTATION CODES:   Obesity unspecified  INTERVENTION:  Continue current diet per SLP Automatic trays from dining services Feeding assistance Ensure Enlive po BID, each supplement provides 350 kcal and 20 grams of protein. 1 packet Juven BID, each packet provides 95 calories, 2.5 grams of protein (collagen), and 9.8 grams of carbohydrate (3 grams sugar); also contains 7 grams of L-arginine and L-glutamine, 300 mg vitamin C, 15 mg vitamin E, 1.2 mcg vitamin B-12, 9.5 mg zinc, 200 mg calcium, and 1.5 g  Calcium Beta-hydroxy-Beta-methylbutyrate to support wound healing MVI with minerals daily If aggressive care is decided upon, pt will need a cortrak and eventual PEG prior to returning to her SNF to ensure that nutrition needs for wound healing can be met  NUTRITION DIAGNOSIS:   Increased nutrient needs related to wound healing as evidenced by estimated needs.  GOAL:   Patient will meet greater than or equal to 90% of their needs  MONITOR:   PO intake, Skin, I & O's, Labs  REASON FOR ASSESSMENT:   Consult Assessment of nutrition requirement/status  ASSESSMENT:   Pt with hx of HTN, HLD, and B-cell lymphoma presented to ED with AMS from her long term nursing facility. Pt has chronic large decubitus ulcer on admission.  7/24 - SLP evaluation, NPO recommended 7/25 - SLP evaluation, DYS 1, thin  Pt resting in bed at the time of assessment. RN setting up IV. Pt awake but unable to understand her speech.   Pt able to have diet advance this AM after repeat SLP consult. However on exam, pt very deconditioned and weak with depletions noted around her face. The rest of her body has significant edema. Likely a combination of IVF overnight and low albumin. Overall presentation consistent with long term poor nutrition. Pt also noted to be edentulous and non-ambulatory at baseline.   RN reports that in addition to large sacral wounds, there is also come  concern on imaging that pt has osteomyelitis. ID consulting and saw pt today.   Although pt has the ability to take in PO, it is highly unlikely that she will be able to meet her needs orally to heal wounds without significant assistance and encouragement with all meals in addition to drinking nutrition supplements. Even then it is not guaranteed. Currently pt is a full code  Discussed with MD. Would recommend a GOC conversation with family if pt is not able to make her own choices. If aggressive care is desired, for pt to have a chance at healing her large wounds, would recommend a PEG placed (cortrak in the meantime) so she can get her full nutrition for wound healing. She would also likely need a diverting colostomy to ensure that stool does not contaminate and soil the wound. Unsure if these aggressive measures would contribute to a quality of life improvement for pt.   Nutritionally Relevant Medications: Scheduled Meds:  atorvastatin  10 mg QPM   pantoprazole  20 mg BID   sucralfate  1 g BID   vancomycin    See admin instructions   Continuous Infusions:  dextrose 5% lactated ringers 100 mL/hr at 06/18/23 0927   meropenem (MERREM) IV Stopped (06/18/23 0008)   PRN Meds: polyethylene glycol  Labs Reviewed: Na 133 BUN 29, creatinine 2.04 CBG ranges from 24-108 mg/dL over the last 24 hours  NUTRITION - FOCUSED PHYSICAL EXAM: Highly suspect malnutrition but edema masking additional signs of muscle and fat wasting and possible weight loss [employer] Most  Recent Value  Orbital Region Moderate depletion  Upper Arm Region No depletion  Thoracic and Lumbar Region No depletion  Buccal Region Mild depletion  Temple Region Moderate depletion  Clavicle Bone Region No depletion  Clavicle and Acromion Bone Region No depletion  Scapular Bone Region No depletion  Dorsal Hand No depletion  Patellar Region No depletion  Anterior Thigh Region No depletion  Posterior Calf Region No depletion   Edema (RD Assessment) Moderate  [significant edema to the extremities and trunk]  Hair Reviewed  Eyes Reviewed  Mouth Reviewed  Skin Reviewed  Nails Reviewed    Diet Order:   Diet Order             DIET - DYS 1 Room service appropriate? Yes with Assist; Fluid consistency: Thin  Diet effective now                   EDUCATION NEEDS:   Not appropriate for education at this time  Skin:  Skin Assessment: Reviewed RN Assessment Per WOC 7/24: Stage 4: - left ischium, 5X13X3cm with 2 cm undermining  Deep Tissue Pressure Injury - Left heel, 1X1cm  - Right heel, 2X1cm  Full Thickness Wounds - back x 3, .5X.5X.1cm, 1X1X.1cm, 1X4X.2cm   Last BM:  7/24 - type 6  Height:   Ht Readings from Last 1 Encounters:  06/16/23 5\' 2"  (1.575 m)    Weight:   Wt Readings from Last 1 Encounters:  06/18/23 89.2 kg    Ideal Body Weight:  50 kg  BMI:  Body mass index is 35.97 kg/m.  Estimated Nutritional Needs:  Kcal:  1700-2000 kcal/d Protein:  85-100 g/d Fluid:  2L/d    Greig Castilla, RD, LDN Clinical Dietitian RD pager # available in [facility]  After hours/weekend pager # available in [facility]

## 2023-06-18 NOTE — Progress Notes (Signed)
Dr. Joneen Roach was made aware that pt had urine output for 12 hour period and bladder scan was zero. Pt was given LR bolus.

## 2023-06-18 NOTE — Progress Notes (Signed)
Speech Language Pathology Treatment: Dysphagia  Patient Details Name: Lauren Santana MRN: [id number] DOB: 21-Mar-1951 Today's Date: 06/18/2023 Time: 6962-9528 SLP Time Calculation (min) (ACUTE ONLY): 20 min  Assessment / Plan / Recommendation Clinical Impression  Pt seen today for follow up PO trials. She is more alert and intermittently able to answer questions, although with low vocal intensity. Pt more consistently able to follow commands for oral motor exam today, which was [facility]. Pt's head with R sided lean, which was unable to be corrected by multiple attempts to reposition. Despite suboptimal positioning, no s/s of aspiration observed throughout all trials of ice chips, thin liquids, purees, and solids. SLP provided trials of ice chips, thin liquids, and purees with increased awareness of bolus and overall better bolus control. Attempted small, bite sized piece of solid (graham cracker) dipped in apple sauce, resulting in significant lingual residue. A cued liquid wash was successful in clearing residue. Recommend starting out with diet of Dys 1 textures with thin liquids and meds given in purees. SLP will f/u as able to assess readiness to upgrade diet as mentation continues to improve.    HPI HPI: 72 y.o. female presented from [facility] due to AMS. Dx acute encephalopathy, large decubitus ulcer, hypoglycemia, severe protein calorie malnutrition. PMHx hypertension, hyperlipidemia, obesity, atrial fibrillation, anemia, diffuse large cell B lymphoma, PE, chronic back pain      SLP Plan  Continue with current plan of care      Recommendations for follow up therapy are one component of a multi-disciplinary discharge planning process, led by the attending physician.  Recommendations may be updated based on patient status, additional functional criteria and insurance authorization.    Recommendations  Diet recommendations: Dysphagia 1 (puree);Thin liquid Liquids provided via:  Cup;Straw Medication Administration: Whole meds with puree Supervision: Full supervision/cueing for compensatory strategies;Staff to assist with self feeding Compensations: Slow rate;Small sips/bites Postural Changes and/or Swallow Maneuvers: Seated upright 90 degrees;Upright 30-60 min after meal                  Oral care BID   Frequent or constant Supervision/Assistance Dysphagia, unspecified (R13.10)     Continue with current plan of care     Gwynneth Aliment, M.A., CF-SLP Speech Language Pathology, Acute Rehabilitation Services  Secure Chat preferred [phone]   06/18/2023, 10:31 AM

## 2023-06-18 NOTE — Progress Notes (Signed)
Physical Therapy Wound Evaluation and Treatment Patient Details  Name: Lauren Santana MRN: [id number] Date of Birth: [birth year]  Today's Date: 06/18/2023 Time: 5284-1324 Time Calculation (min): 33 min  Subjective  Subjective Assessment Subjective: Lots of incoherent muttering Patient and Family Stated Goals: unable to state Date of Onset:  (unknown) Prior Treatments: dressing changes  Pain Score:   Moaning with repositioning, no pain indicated during wound care  Wound Assessment  Pressure Injury 06/17/23 Ischial tuberosity Left Stage 4 - Full thickness tissue loss with exposed bone, tendon or muscle. (Active)  Wound Image   06/18/23 1509  Dressing Type Foam - Lift dressing to assess site every shift;Gauze (Comment);Barrier Film (skin prep);Moist to moist 06/18/23 1509  Dressing Clean, Dry, Intact;Changed 06/18/23 1509  Dressing Change Frequency Daily 06/18/23 1509  State of Healing Early/partial granulation 06/18/23 1509  Site / Wound Assessment Black;Brown;Dusky;Granulation tissue;Pale;Pink;Yellow 06/18/23 1509  % Wound base Red or Granulating 60% 06/18/23 1509  % Wound base Yellow/Fibrinous Exudate 35% 06/18/23 1509  % Wound base Black/Eschar 5% 06/18/23 1509  Peri-wound Assessment Denuded;Pink 06/18/23 1509  Wound Length (cm) 9.5 cm 06/18/23 1509  Wound Width (cm) 14 cm 06/18/23 1509  Wound Depth (cm) 3.4 cm 06/18/23 1509  Wound Surface Area (cm^2) 133 cm^2 06/18/23 1509  Wound Volume (cm^3) 452.2 cm^3 06/18/23 1509  Undermining (cm) at 1:00 3.0cm 06/18/23 1509  Margins Unattached edges (unapproximated) 06/18/23 1509  Drainage Amount Moderate 06/18/23 1509  Drainage Description Serosanguineous 06/18/23 1509  Treatment Debridement (Selective);Irrigation 06/18/23 1509      Selective Debridement (non-excisional) Selective Debridement (non-excisional) - Location: Lt ischium Selective Debridement (non-excisional) - Tools Used: Forceps, Scalpel Selective Debridement  (non-excisional) - Tissue Removed: black necrotic tissue    Wound Assessment and Plan  Wound Therapy - Assess/Plan/Recommendations Wound Therapy - Clinical Statement: Patient referred to hydrotherapy with large left ischial wound with large amount of necrotic tissue. Area is difficult to access due to pt's body habitus and difficult to position. Patient may benefit from hydrotherapy to remove necrotic tissue, decrease bioburden and promote healing. Wound Therapy - Functional Problem List: immobility Factors Delaying/Impairing Wound Healing: Incontinence, Infection - systemic/local, Immobility, Multiple medical problems Hydrotherapy Plan: Debridement, Dressing change Wound Therapy - Frequency: 2X / week Wound Therapy - Follow Up Recommendations: dressing changes by RN  Wound Therapy Goals- Improve the function of patient's integumentary system by progressing the wound(s) through the phases of wound healing (inflammation - proliferation - remodeling) by: Wound Therapy Goals - Improve the function of patient's integumentary system by progressing the wound(s) through the phases of wound healing by: Decrease Necrotic Tissue to: 20 Decrease Necrotic Tissue - Progress: Goal set today Increase Granulation Tissue to: 80 Increase Granulation Tissue - Progress: Goal set today Improve Drainage Characteristics: Min Improve Drainage Characteristics - Progress: Goal set today Goals/treatment plan/discharge plan were made with and agreed upon by patient/family: No, Patient unable to participate in goals/treatment/discharge plan and family unavailable Time For Goal Achievement: 7 days Wound Therapy - Potential for Goals: Fair  Goals will be updated until maximal potential achieved or discharge criteria met.  Discharge criteria: when goals achieved, discharge from hospital, MD decision/surgical intervention, no progress towards goals, refusal/missing three consecutive treatments without notification or  medical reason.  GP     Charges PT Wound Care Charges $Wound Debridement up to 20 cm: < or equal to 20 cm $ Wound Debridement each add'l 20 sqcm: 2 $PT Hydrotherapy Visit: 1 Visit      Jerolyn Center, PT Acute  Rehabilitation Services  Office [phone]   Zena Amos 06/18/2023, 3:18 PM

## 2023-06-18 NOTE — Plan of Care (Signed)

## 2023-06-18 NOTE — Consult Note (Addendum)
[facility]       Reason for Consult: left ischial wound   Referring Physician: Dr. Renford Dills  Principal Problem:   Acute encephalopathy Active Problems:   Essential hypertension, benign   Dyslipidemia   Obesity (BMI 30-39.9)   AKI (acute kidney injury) (HCC)   History of pulmonary embolus (PE)   Atrial fibrillation (HCC)   Anemia of chronic disease   Chronic pain   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HCC)    apixaban  5 mg Oral BID   atorvastatin  10 mg Oral QPM   Chlorhexidine Gluconate Cloth  6 each Topical Daily   furosemide  60 mg Intravenous Q12H   pantoprazole (PROTONIX) IV  40 mg Intravenous Daily   sodium chloride flush  3 mL Intravenous Q12H   sucralfate  1 g Oral BID   vancomycin variable dose per unstable renal function (pharmacist dosing)   Does not apply See admin instructions    Recommendations: Ampicillin/sulbactam for now Will stop vancomycin and meropenem Augmentin oral for 7 days at discharge  Ok to pull the midline from ID standpoint at discharge  I would be happy to see her after discharge if aggressive wound care pursued, but otherwise, no follow up needed.    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  Difficult situation.  She has a large ulcer and for any chance of healing, needs aggressive wound care.  The wound becomes soiled with stool as she is incontinent of stool, her nutrition appears poor with a low albumin and I suspect she is not able to offload for any considerable amount of time.  If healing of the wound is the goal, she will need a multi pronged approach to include offloading for a considerable amount of time, avoid soiling including consideration of a diverting colostomy and significant nutrition support.  Antibiotics can be adjunctive in helping heal infection including osteomyelitis but if above not addressed, antibiotics have no  role for cure except for short courses if there is a wound infection.  If aggressive care is desired she will need above support after discharge and can consider antibiotics for osteomyelitis then.  I did not some purulence now so will have her continue with ampicillin sulbactam and she can continue with Augmentin as an outpatient for another week.   Encephalopathy - some improvement though did not verbalize anything to me.  Etiology is broad and she recently started on oxycodone along with tramadol and gabapentin at the NH so certainly could be the cause.  Had some uremia with her elevated creat which may also contribute.  Cefepime toxicity possible but is a diagnosis of exclusion as we are unable to get levels and is not common.    Acute renal insufficiency - high vancomycin trough levels.  Dehydration vs vancomycin-induced.  I have stopped vancomycin.    HPI: Lauren Santana is a 72 y.o. female with a history of hypertension, obesity, A fib, diffuse large B-cell lymphoma and chronic pain was sent in by the NH with concern for altered mental status.  She is unsure how long this has been going on.  Nods her head yes that it is worsening.  She was started on vancomycin, cefepime and metronidazole at the NH to treat her ischial ulcer for an unclear duration.  MRI done here c/w a large ulcer overlying the sacrum and coccyx with bone marrow edema within the  coccyx and sacral portion of the spine concerning for osteomyelitis.  Some myositis noted.  + leukocytosis.  No fever since admission.  She does not verbalize anything to me.    I have personally spent 97 minutes involved in face-to-face and non-face-to-face activities for this patient on the day of the visit. Professional time spent includes the following activities: Preparing to see the patient (review of tests), Obtaining and/or reviewing separately obtained history (admission/discharge record), Performing a medically appropriate examination and/or  evaluation , Ordering medications/tests/procedures, referring and communicating with other health care professionals, Documenting clinical information in the EMR, Independently interpreting results (not separately reported), Communicating results to the patient/family/caregiver, Counseling and educating the patient/family/caregiver and Care coordination (not separately reported).     Review of Systems:  Unable to be assessed due to mental status  Past Medical History:  Diagnosis Date   Acute deep vein thrombosis (DVT) of right lower extremity (HCC) 02/14/2016   Arthritis    Edema    B/LLE   Endometriosis    High cholesterol    Hypertension    Numbness    feet   Pre-diabetes    Pulmonary emboli (HCC) 02/13/2016   Pulmonary embolism (HCC)    recurrent, second episode in 06/2020   Pulmonary embolism with acute cor pulmonale (HCC)    Walker as ambulation aid    Wears dentures    Wears glasses     Social History   Tobacco Use   Smoking status: Never   Smokeless tobacco: Never  Vaping Use   Vaping status: Never Used  Substance Use Topics   Alcohol use: No   Drug use: No    Family History  Problem Relation Age of Onset   Colon cancer Sister    Cancer Sister        unsure of type of cancer    Allergies  Allergen Reactions   Tylenol [Acetaminophen] Other (See Comments)    States "seems like eats my stomach"   Sulfa Antibiotics Other (See Comments)    Unknown allergic reaction    Physical Exam: Constitutional: in no apparent distress  Vitals:   06/18/23 0851 06/18/23 1129  BP: 104/65 126/61  Pulse: 85 87  Resp: 16 17  Temp: 98.2 F (36.8 C) 98.3 F (36.8 C)  SpO2: 100% 100%   EYES: anicteric Respiratory: normal respiratory effort GI: soft Back: large ulcer, some mild purulence noted, + exposed bone  Lab Results  Component Value Date   WBC 16.3 (H) 06/18/2023   HGB 7.2 (L) 06/18/2023   HCT 23.3 (L) 06/18/2023   MCV 95.1 06/18/2023   PLT 249 06/18/2023     Lab Results  Component Value Date   CREATININE 2.04 (H) 06/18/2023   BUN 29 (H) 06/18/2023   NA 133 (L) 06/18/2023   K 3.6 06/18/2023   CL 104 06/18/2023   CO2 17 (L) 06/18/2023    Lab Results  Component Value Date   ALT 12 06/17/2023   AST 25 06/17/2023   ALKPHOS 88 06/17/2023     Microbiology: Recent Results (from the past 240 hour(s))  Blood Culture (routine x 2)     Status: None (Preliminary result)   Collection Time: 06/16/23 12:00 PM   Specimen: BLOOD RIGHT FOREARM  Result Value Ref Range Status   Specimen Description BLOOD RIGHT FOREARM  Final   Special Requests   Final    BOTTLES DRAWN AEROBIC AND ANAEROBIC Blood Culture results may not be optimal due to an inadequate volume  of blood received in culture bottles   Culture   Final    NO GROWTH 2 DAYS Performed at [facility], [address], [place], [place] [address]    Report Status PENDING  Incomplete  Blood Culture (routine x 2)     Status: None (Preliminary result)   Collection Time: 06/16/23 12:18 PM   Specimen: BLOOD  Result Value Ref Range Status   Specimen Description BLOOD RIGHT ANTECUBITAL  Final   Special Requests   Final    BOTTLES DRAWN AEROBIC ONLY Blood Culture adequate volume   Culture   Final    NO GROWTH 2 DAYS Performed at [facility], [address] [place], [place] [address]    Report Status PENDING  Incomplete  MRSA Next Gen by PCR, Nasal     Status: None   Collection Time: 06/17/23  4:10 AM   Specimen: Nasal Mucosa; Nasal Swab  Result Value Ref Range Status   MRSA by PCR Next Gen NOT DETECTED NOT DETECTED Final    Comment: (NOTE) The GeneXpert MRSA Assay (FDA approved for NASAL specimens only), is one component of a comprehensive MRSA colonization surveillance program. It is not intended to diagnose MRSA infection nor to guide or monitor treatment for MRSA infections. Test performance is not FDA approved in patients less than 28 years old. Performed at [facility], [address], [place], [place] [address]     Gardiner Barefoot, MD [facility] [email or web address] 06/18/2023, 2:41 PM

## 2023-06-18 NOTE — Progress Notes (Signed)
Pt drowsy but responds to name , was given narcan around 1830, pt was alert and following commands around 1930. BP:92/59 , MAP 69. Provider notified , see new orders.   06/18/23 2115  Vitals  Temp (!) 97.5 F (36.4 C)  Temp Source Axillary  BP (!) 92/59  MAP (mmHg) 69  BP Location Right Arm  BP Method Automatic  Patient Position (if appropriate) Lying  Pulse Rate 91  Pulse Rate Source Monitor  Level of Consciousness  Level of Consciousness Alert  MEWS COLOR  MEWS Score Color Green  Oxygen Therapy  SpO2 100 %  O2 Device Room Air  MEWS Score  MEWS Temp 0  MEWS Systolic 1  MEWS Pulse 0  MEWS RR 0  MEWS LOC 0  MEWS Score 1

## 2023-06-18 NOTE — Progress Notes (Signed)
Pt given ordered narcan , pt now alert and following commands .

## 2023-06-18 NOTE — Progress Notes (Signed)
Pharmacy Antibiotic Note - Unasyn  See previous note from previous pharmacist for full details. Pharmacy now consulted to dose Unasyn (DC vancomycin/meropenem) per ID recommendations.  Plan: - Unasyn 3g IV q12h for CrCl < 30 ml/min - Follow up renal function and adjust as needed  Loralee Pacas, PharmD, BCPS 06/18/2023 3:28 PM

## 2023-06-18 NOTE — Plan of Care (Addendum)
  Wound with packing changed by PT with Hydrotherapy today. Low air loss bed air mattress ordered for patient today.   Problem: Coping: Goal: Level of anxiety will decrease Outcome: Progressing   Problem: Elimination: Goal: Will not experience complications related to bowel motility Outcome: Progressing Goal: Will not experience complications related to urinary retention Outcome: Progressing   Problem: Pain Managment: Goal: General experience of comfort will improve Outcome: Progressing   Problem: Safety: Goal: Ability to remain free from injury will improve Outcome: Progressing   Problem: Skin Integrity: Goal: Risk for impaired skin integrity will decrease Outcome: Progressing

## 2023-06-18 NOTE — Progress Notes (Signed)
Pharmacy Antibiotic Note UVA RUNKEL is a 72 y.o. female for which pharmacy has been consulted for meropenem and vancomycin dosing for  wound infection , sacral osteo; cefepime discontinued given concern for neurotoxicity.  Patient with a history of HTN, HLD, pre-DM, PE. Patient presenting with AMS.  Pt receiving cefepime and vancomycin at [facility]. Has been on since 7/13. Per facility, last doses were 7/22.  Outpt Abx: Cefepime 1g q12h Vancomycin 1q12h Start 7/13: Duration 6 weeks  7/25 Random vancomycin level 47 and SCr up 1.89 > 2.02 (baseline 0.54 in 02/2023).   Plan: Continue to hold vancomycin Repeat vancomycin random level 7/27 or 7/28? Continue meropenem 1 g IV q12h  Monitor WBC, fever, renal function, cultures De-escalate when able F/u ID recommendations - consulted 7/25  Height: 5\' 2"  (157.5 cm) Weight: 89.2 kg (196 lb 10.4 oz) IBW/kg (Calculated) : 50.1  Temp (24hrs), Avg:98.2 F (36.8 C), Min:98.1 F (36.7 C), Max:98.3 F (36.8 C)  Recent Labs  Lab 06/16/23 1218 06/16/23 1225 06/16/23 1713 06/17/23 0134 06/18/23 0248  WBC 16.5*  --  17.5* 17.2* 16.3*  CREATININE 2.02*  --   --  1.89* 2.04*  LATICACIDVEN  --  1.3  --   --   --   VANCORANDOM  --   --  >60*  --  47    Estimated Creatinine Clearance: 25.5 mL/min (A) (by C-G formula based on SCr of 2.04 mg/dL (H)).    Allergies  Allergen Reactions   Tylenol [Acetaminophen] Other (See Comments)    States "seems like eats my stomach"   Sulfa Antibiotics Other (See Comments)    Unknown allergic reaction    Antimicrobials this admission: Vanc 7/13 >> Cefepime 7/13 >> 7/22 Meropenem 7/23 >>   Microbiology results: 7/23 BCx2: ngtd 7/24 MRSA PCR neg  Thank you for involving pharmacy in this patient's care.  [employer], Pharm.D., BCPS Clinical Pharmacist Clinical phone for 06/18/2023 from 7:30-3:00 is [phone].  **Pharmacist phone directory can be found on [email or web address] listed under [facility]  [facility]arm[facility].  06/18/2023 3:02 PM

## 2023-06-18 NOTE — Progress Notes (Signed)
PROGRESS NOTE  Lauren Santana  ZOX:[id number] DOB: 07/26/1951 DOA: 06/16/2023 PCP: Renaye Rakers, MD   Brief Narrative: Patient is a 72 year old female with history of hypertension, hyperlipidemia, obesity, atrial fibrillation, anemia, diffuse large cell B lymphoma, PE, chronic back pain who presented with altered mental status from a facility.  Patient resides at nursing facility and was receiving IV antibiotics including vancomycin ,cefepime and Flagyl for large decubitus ulcer.  Patient's mentation gradually worsened.  At baseline, as per son,she is alert and oriented x 4.  She was mildly febrile presentation.  Labs showed potassium of 3.3, creatinine of 2 (baseline creatinine normal), glucose 60, albumin of 1.5, WBC count of 16.5, hemoglobin of 7.7.  FOBT negative.  Chest x-ray showed decreased lung volumes without any acute findings.  CT head didnt show any acute abnormality.  CT abdomen/pelvis did not show any acute abnormality but showed large decubitus ulcer on the left gluteus to the coccyx.  Patient was started on vancomycin and meropenem for decubitus ulcer.  Brain MRI did not show any acute findings.  MRI of the sacrum showed osteomyelitis.  ID consulted.  PT starting hydrotherapy today  Assessment & Plan:  Principal Problem:   Acute encephalopathy Active Problems:   Essential hypertension, benign   Dyslipidemia   Obesity (BMI 30-39.9)   AKI (acute kidney injury) (HCC)   History of pulmonary embolus (PE)   Atrial fibrillation (HCC)   Anemia of chronic disease   Chronic pain   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HCC)  Acute encephalopathy: Unclear etiology.  Could be from sepsis or cefepime toxicity or polypharmacy.  She was  disoriented, mumbling and speaking irreverently on admission. CT head did not show any acute findings.   As per report,patient is usually alert and oriented x 4 at baseline.  Presented with worsening encephalopathy.  UA was not suspicious for UTI. Takes  gabapentin, tramadol, oxycodone, sertraline at  facility.  Currently these  medications are on hold. MRI of the brain did not show any acute findings. Ammonia level normal. Her mentation has improved.  Currently she is oriented to place  Large decubitus ulcer: Continue current antibiotics.  Follow-up blood cultures.Wound care following.  ID consulted today.  MRI of the sacrum showed osteomyelitis.Also showed Diffuse intramuscular edema of the included gluteal and pelvic musculature, suggestive of myositis. No drainable fluid collections.  Has moderate leukocytosis  Hypoglycemia: Likely from decreased oral intake.  Resolved now .speech therapy following.  Started on dysphagia 1 diet  AKI: Baseline creatinine normal.  Patient with creatinine in the range of 2, now improving.  Patient appears severely volume overloaded.  Will start on IV Lasix.Foley in place.  Will get renal ultrasound  Anasarca: Will check echocardiogram.  Continue Lasix  Normocytic anemia: Hemoglobin of 12 about 3 months ago.  Now in the range of 8.  Will continue to monitor, no evidence of acute blood loss.    Negative FOBT, iron level optimal  Hypokalemia: Being monitored and supplemented as needed.  Hyperlipidemia: On Lipitor at home.  Paroxysmal A-fib: Takes labetalol for rate control, on anticoagulation with Eliquis.  Eliquis will be resumed .  Currently on normal sinus rhythm  History of PE: On Eliquis  Chronic back pain: Takes tramadol ,oxy at baseline.  Severe protein calorie malnutrition: Albumin of 1.5.  Will consult dietitian  History of diffuse large cell B lymphoma: Currently under observation.  Follows with Dr. Lazarus Salines, oncology at [facility]  Deconditioning/debility: Has been residing in nursing facility since  1 to 2 years.  Unable to walk.  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     Pressure Injury 06/17/23 Ischial tuberosity Left Stage 4 - Full thickness tissue loss with exposed bone,  tendon or muscle. (Active)  06/17/23 0330  Location: Ischial tuberosity  Location Orientation: Left  Staging: Stage 4 - Full thickness tissue loss with exposed bone, tendon or muscle.  Wound Description (Comments):   Present on Admission: Yes  Dressing Type Foam - Lift dressing to assess site every shift;Moist to dry 06/17/23 1521    DVT prophylaxis:SCDs Start: 06/16/23 1542     Code Status: Full Code  Family Communication: Called and discussed with son on phone on 7/24.Called twice again today, calls not received  Patient status:Inpatient  Patient is from :SNF  Anticipated discharge to:SNF  Estimated DC date: Not sure   Consultants: General surgery  Procedures:None  Antimicrobials:  Anti-infectives (From admission, onward)    Start     Dose/Rate Route Frequency Ordered Stop   06/16/23 2200  metroNIDAZOLE (FLAGYL) IVPB 500 mg  Status:  Discontinued        500 mg 100 mL/hr over 60 Minutes Intravenous 2 times daily 06/16/23 1545 06/16/23 1558   06/16/23 2200  meropenem (MERREM) 500 mg in sodium chloride 0.9 % 100 mL IVPB  Status:  Discontinued        500 mg 200 mL/hr over 30 Minutes Intravenous Every 12 hours 06/16/23 1558 06/16/23 1600   06/16/23 2200  meropenem (MERREM) 1 g in sodium chloride 0.9 % 100 mL IVPB        1 g 200 mL/hr over 30 Minutes Intravenous Every 12 hours 06/16/23 1600     06/16/23 1500  ceFEPIme (MAXIPIME) 1 g in sodium chloride 0.9 % 100 mL IVPB  Status:  Discontinued        1 g 200 mL/hr over 30 Minutes Intravenous Every 12 hours 06/16/23 1447 06/16/23 1558   06/16/23 1331  vancomycin variable dose per unstable renal function (pharmacist dosing)         Does not apply See admin instructions 06/16/23 1331         Subjective: Patient seen and examined at bedside today.  Hemodynamically stable.  She looks more comfortable and coherent today.  She is alert and awake and obeys commands.  Knows that she is in the hospital.  Not in any kind of   distress.  Very deconditioned and weak  Objective: Vitals:   06/18/23 0331 06/18/23 0414 06/18/23 0851 06/18/23 1129  BP: 121/62  104/65 126/61  Pulse: (!) 102  85 87  Resp: 18  16 17   Temp: 98.1 F (36.7 C)  98.2 F (36.8 C) 98.3 F (36.8 C)  TempSrc: Oral  Axillary Oral  SpO2: 100%  100% 100%  Weight:  89.2 kg    Height:        Intake/Output Summary (Last 24 hours) at 06/18/2023 1337 Last data filed at 06/18/2023 1153 Gross per 24 hour  Intake 2578.08 ml  Output 400 ml  Net 2178.08 ml   Filed Weights   06/16/23 1120 06/17/23 0345 06/18/23 0414  Weight: 88 kg 85.7 kg 89.2 kg    Examination:   General exam: Overall comfortable, not in distress,obese, very deconditioned, weak HEENT: PERRL Respiratory system:  no wheezes or crackles  Cardiovascular system: S1 & S2 heard, RRR.  Gastrointestinal system: Abdomen is nondistended, soft and nontender. Central nervous system: Alert and awake, oriented to place only  Extremities: Severe anasarca, no clubbing ,no cyanosis Skin: Sacral ulcer   Data Reviewed: I have personally reviewed following labs and imaging studies  CBC: Recent Labs  Lab 06/16/23 1218 06/16/23 1713 06/17/23 0134 06/18/23 0248  WBC 16.5* 17.5* 17.2* 16.3*  NEUTROABS 12.0*  --   --   --   HGB 7.7* 8.9* 8.3* 7.2*  HCT 25.5* 30.3* 27.8* 23.3*  MCV 97.7 103.1* 98.2 95.1  PLT 351 243 297 249   Basic Metabolic Panel: Recent Labs  Lab 06/16/23 1218 06/17/23 0134 06/18/23 0248  NA 134* 132* 133*  K 3.3* 3.7 3.6  CL 100 104 104  CO2 19* 19* 17*  GLUCOSE 60* 52* 99  BUN 25* 26* 29*  CREATININE 2.02* 1.89* 2.04*  CALCIUM 8.2* 7.9* 7.9*     Recent Results (from the past 240 hour(s))  Blood Culture (routine x 2)     Status: None (Preliminary result)   Collection Time: 06/16/23 12:00 PM   Specimen: BLOOD RIGHT FOREARM  Result Value Ref Range Status   Specimen Description BLOOD RIGHT FOREARM  Final   Special Requests   Final    BOTTLES DRAWN  AEROBIC AND ANAEROBIC Blood Culture results may not be optimal due to an inadequate volume of blood received in culture bottles   Culture   Final    NO GROWTH 2 DAYS Performed at [facility], [address], [place], [place] [address]    Report Status PENDING  Incomplete  Blood Culture (routine x 2)     Status: None (Preliminary result)   Collection Time: 06/16/23 12:18 PM   Specimen: BLOOD  Result Value Ref Range Status   Specimen Description BLOOD RIGHT ANTECUBITAL  Final   Special Requests   Final    BOTTLES DRAWN AEROBIC ONLY Blood Culture adequate volume   Culture   Final    NO GROWTH 2 DAYS Performed at [facility], [address], [place], [place] [address]    Report Status PENDING  Incomplete  MRSA Next Gen by PCR, Nasal     Status: None   Collection Time: 06/17/23  4:10 AM   Specimen: Nasal Mucosa; Nasal Swab  Result Value Ref Range Status   MRSA by PCR Next Gen NOT DETECTED NOT DETECTED Final    Comment: (NOTE) The GeneXpert MRSA Assay (FDA approved for NASAL specimens only), is one component of a comprehensive MRSA colonization surveillance program. It is not intended to diagnose MRSA infection nor to guide or monitor treatment for MRSA infections. Test performance is not FDA approved in patients less than 37 years old. Performed at [facility], [address], [place], [place] [address]      Radiology Studies: MR SACRUM SI JOINTS W WO CONTRAST  Result Date: 06/18/2023 CLINICAL DATA:  Sepsis.  Sacral decubitus ulcer EXAM: MRI SACRUM WITHOUT AND WITH CONTRAST TECHNIQUE: Multiplanar and multiecho pulse sequences of the sacrum were obtained without and with intravenous contrast. CONTRAST:  10mL GADAVIST GADOBUTROL 1 MMOL/ML IV SOLN COMPARISON:  CT 06/16/2023, 05/22/2020 FINDINGS: Bones/Joint/Cartilage Large sacral decubitus ulcer overlying the distal sacrum and coccyx. There is bone marrow edema with intermediate T1 signal within the coccyx (series 3,  image 13). Subcortical bone marrow edema is also seen within the distal most sacral segment (series 3, image 14). No additional sites of osteomyelitis are identified. No fracture or diastasis. No sacroiliac joint effusion or synovitis to suggest septic arthritis. Degenerative disc disease of the included lower lumbar spine.  Ligaments Intact. Muscles and Tendons Diffuse intramuscular edema of the included gluteal and pelvic musculature. Soft tissues Large sacral decubitus ulcer with packing material. Ulcer base extends to the cortex of the distal sacrum and coccyx. No drainable fluid collections. Known site of endometriosis within the left adnexal region measuring 2.8 cm in size (series 4, image 23), not appreciably changed since 2021. IMPRESSION: 1. Large sacral decubitus ulcer overlying the distal sacrum and coccyx. There is bone marrow edema within the coccyx and distal-most sacral segment, compatible with osteomyelitis. 2. Diffuse intramuscular edema of the included gluteal and pelvic musculature, suggestive of myositis. No drainable fluid collections. 3. Known site of endometriosis within the left adnexal region measuring 2.8 cm in size, not appreciably changed since 2021. Electronically Signed   By: Duanne Guess D.O.   On: 06/18/2023 08:24   MR BRAIN WO CONTRAST  Result Date: 06/17/2023 CLINICAL DATA:  Provided history: Encephalopathy. EXAM: MRI HEAD WITHOUT CONTRAST TECHNIQUE: Multiplanar, multiecho pulse sequences of the brain and surrounding structures were obtained without intravenous contrast. COMPARISON:  Head CT 06/16/2023. Brain MRI 12/20/2019. Cervical spine MRI 03/21/2023. FINDINGS: Brain: Mild generalized parenchymal atrophy. Multifocal T2 FLAIR hyperintense signal abnormality within the cerebral white matter, nonspecific but compatible with advanced chronic small vessel ischemic disease. Redemonstrated subcentimeter focus of susceptibility-weighted signal loss within the medial right  cerebellar hemisphere, which may reflect a cavernoma or small chronic hemorrhage. There are a few punctate chronic microhemorrhages elsewhere within the right cerebellar hemisphere and supratentorial brain. There is no acute infarct. No extra-axial fluid collection. No midline shift. Vascular: Maintained flow voids within the proximal large arterial vessels. Skull and upper cervical spine: No focal suspicious marrow lesion. Incompletely assessed cervical spondylosis. Known severe spinal canal stenosis at C3-C4. Sinuses/Orbits: No mass or acute finding within the imaged orbits. Other: The infiltrative process at the right skull base, right orbital apex and right maxillary sinus described on the prior brain MRI of 12/20/2019 is poorly reassessed in the absence of intravenous contrast. Trace fluid within the bilateral mastoid air cells. IMPRESSION: 1. No evidence of an acute intracranial abnormality. 2. The infiltrative process at the right skull base, right orbital apex and right maxillary sinus described on the prior brain MRI of 12/20/2019 is poorly reassessed in the absence of intravenous contrast. Correlate with the prior pathology findings. 3. Advanced chronic small vessel ischemic changes within the cerebral white matter, similar to the prior MRI. 4. Redemonstrated subcentimeter focus of signal abnormality within the medial right cerebellar hemisphere, which may reflect a cavernoma or small chronic hemorrhage. 5. Incompletely assessed cervical spondylosis with known severe spinal canal stenosis at C3-C4. Electronically Signed   By: Jackey Loge D.O.   On: 06/17/2023 12:27   CT Head Wo Contrast  Result Date: 06/16/2023 CLINICAL DATA:  Headache, new onset (Age >= 51y) EXAM: CT HEAD WITHOUT CONTRAST TECHNIQUE: Contiguous axial images were obtained from the base of the skull through the vertex without intravenous contrast. RADIATION DOSE REDUCTION: This exam was performed according to the departmental  dose-optimization program which includes automated exposure control, adjustment of the mA and/or kV according to patient size and/or use of iterative reconstruction technique. COMPARISON:  CT scan head from 02/19/2023. FINDINGS: Examination is limited due to patient's motion during data acquisition. Only the gross findings are described as follows: Brain: No evidence of acute infarction, hemorrhage, hydrocephalus, extra-axial collection or mass lesion/mass effect. There is mild prominence of cerebral sulci due to age-related atrophy. There is bilateral periventricular hypodensity, which is  non-specific but most likely seen in the settings of microvascular ischemic changes. Mild in extent. Vascular: No hyperdense vessel or unexpected calcification. Skull: Normal. Negative for fracture or focal lesion. Sinuses/Orbits: No acute finding. Other: None. IMPRESSION: *Limited exam. *No acute intracranial abnormality. Electronically Signed   By: Jules Schick M.D.   On: 06/16/2023 15:00   CT ABDOMEN PELVIS WO CONTRAST  Result Date: 06/16/2023 CLINICAL DATA:  Abdominal pain, acute, nonlocalized EXAM: CT ABDOMEN AND PELVIS WITHOUT CONTRAST TECHNIQUE: Multidetector CT imaging of the abdomen and pelvis was performed following the standard protocol without IV contrast. RADIATION DOSE REDUCTION: This exam was performed according to the departmental dose-optimization program which includes automated exposure control, adjustment of the mA and/or kV according to patient size and/or use of iterative reconstruction technique. COMPARISON:  CT scan abdomen and pelvis from 05/22/2020. FINDINGS: Examination is limited due to lack of intravenous contrast and patient's motion during data acquisition. Lower chest: There are patchy atelectatic changes in the visualized lung bases. No overt consolidation. No pleural effusion. The heart is normal in size. No pericardial effusion. Hepatobiliary: The liver is normal in size. Non-cirrhotic  configuration. No suspicious mass. No intrahepatic or extrahepatic bile duct dilation. Small volume layering tiny calcified gallstones noted gallstones. Normal gallbladder wall thickness. No pericholecystic inflammatory changes. Pancreas: Unremarkable. No pancreatic ductal dilatation or surrounding inflammatory changes. Spleen: Within normal limits. No focal lesion. Adrenals/Urinary Tract: Adrenal glands are unremarkable. No suspicious renal mass. There is a hypoattenuating structure in the left kidney interpolar region, anteriorly which exhibits tiny foci of dystrophic calcifications along the lateral aspect. This has decreased since the prior study from 2021 and may represent involuting slightly complex renal cyst. There is an additional hypoattenuating structure in the right kidney upper pole, laterally, which is incompletely characterized on the current examination but appears unchanged since the prior study. No hydronephrosis. No renal or ureteric calculi. Unremarkable urinary bladder. Stomach/Bowel: There is a small sliding hiatal hernia. There are at least 2 diverticula arising from the distal duodenum. No disproportionate dilation of the small or large bowel loops. No evidence of abnormal bowel wall thickening or inflammatory changes. The appendix is unremarkable. There are multiple diverticula mainly in the left hemi colon, without imaging signs of diverticulitis. Vascular/Lymphatic: No ascites or pneumoperitoneum. No abdominal or pelvic lymphadenopathy, by size criteria. No aneurysmal dilation of the major abdominal arteries. There are mild peripheral atherosclerotic vascular calcifications of the aorta and its major branches. Reproductive: The uterus is surgically absent. No large adnexal mass. Other: Multiple midline periumbilical incisional hernias noted containing fat. There is a hernia along the superior aspect which exhibits herniation of portion of the transverse colon without obstruction. There is  a large decubitus ulcer over the left paramedian gluteal region with soft tissue defect of approximately 2.8 x 8.4 cm, reaching up to the coccyx. Musculoskeletal: No suspicious osseous lesions. There are moderate - marked multilevel degenerative changes in the visualized spine. IMPRESSION: 1. No acute inflammatory process identified within the abdomen or pelvis. 2. There is a large decubitus ulcer over the left paramedian gluteal region reaching up to the coccyx. 3. Multiple periumbilical midline incisional hernias with 1 hernia containing unobstructed portion of transverse colon. 4. Multiple other nonacute observations, as described above. Electronically Signed   By: Jules Schick M.D.   On: 06/16/2023 14:55    Scheduled Meds:  atorvastatin  10 mg Oral QPM   Chlorhexidine Gluconate Cloth  6 each Topical Daily   pantoprazole (PROTONIX) IV  40 mg  Intravenous Daily   sodium chloride flush  3 mL Intravenous Q12H   sucralfate  1 g Oral BID   vancomycin variable dose per unstable renal function (pharmacist dosing)   Does not apply See admin instructions   Continuous Infusions:  sodium chloride 10 mL/hr at 06/18/23 1203   dextrose 5% lactated ringers Stopped (06/18/23 0954)   meropenem (MERREM) IV 1 g (06/18/23 1205)     LOS: 2 days   Burnadette Pop, MD [facility] P7/25/2024, 1:37 PM

## 2023-06-19 ENCOUNTER — Inpatient Hospital Stay (HOSPITAL_COMMUNITY): Payer: 59

## 2023-06-19 DIAGNOSIS — Z7189 Other specified counseling: Secondary | ICD-10-CM | POA: Diagnosis not present

## 2023-06-19 DIAGNOSIS — Z66 Do not resuscitate: Secondary | ICD-10-CM | POA: Diagnosis not present

## 2023-06-19 DIAGNOSIS — G9340 Encephalopathy, unspecified: Secondary | ICD-10-CM | POA: Diagnosis not present

## 2023-06-19 DIAGNOSIS — Z515 Encounter for palliative care: Secondary | ICD-10-CM

## 2023-06-19 LAB — BASIC METABOLIC PANEL
Anion gap: 8 (ref 5–15)
BUN: 30 mg/dL — ABNORMAL HIGH (ref 8–23)
CO2: 21 mmol/L — ABNORMAL LOW (ref 22–32)
Calcium: 7.7 mg/dL — ABNORMAL LOW (ref 8.9–10.3)
Chloride: 104 mmol/L (ref 98–111)
Creatinine, Ser: 2.07 mg/dL — ABNORMAL HIGH (ref 0.44–1.00)
GFR, Estimated: 25 mL/min — ABNORMAL LOW (ref >60)
Glucose, Bld: 86 mg/dL (ref 70–99)
Potassium: 3.2 mmol/L — ABNORMAL LOW (ref 3.5–5.1)
Sodium: 133 mmol/L — ABNORMAL LOW (ref 135–145)

## 2023-06-19 LAB — GLUCOSE, CAPILLARY
Glucose-Capillary: 82 mg/dL (ref 70–99)
Glucose-Capillary: 83 mg/dL (ref 70–99)

## 2023-06-19 LAB — BLOOD GAS, ARTERIAL
Acid-base deficit: 2.7 mmol/L — ABNORMAL HIGH (ref 0.0–2.0)
Bicarbonate: 21.4 mmol/L (ref 20.0–28.0)
Drawn by: 23604
O2 Saturation: 99.8 %
Patient temperature: 36.7
pCO2 arterial: 33 mmHg (ref 32–48)
pH, Arterial: 7.42 (ref 7.35–7.45)
pO2, Arterial: 106 mmHg (ref 83–108)

## 2023-06-19 MED ORDER — POLYVINYL ALCOHOL 1.4 % OP SOLN: 1.0000 [drp] | Freq: Four times a day (QID) | OPHTHALMIC | Status: DC | PRN

## 2023-06-19 MED ORDER — HYDROMORPHONE HCL 1 MG/ML IJ SOLN
0.5000 mg | Freq: Four times a day (QID) | INTRAMUSCULAR | Status: DC
  Administered 2023-06-19: 0.5 mg via INTRAVENOUS
  Filled 2023-06-19: qty 0.5

## 2023-06-19 MED ORDER — HYDROMORPHONE HCL 1 MG/ML IJ SOLN
0.2500 mg | Freq: Four times a day (QID) | INTRAMUSCULAR | Status: DC
  Administered 2023-06-19 – 2023-06-20 (×4): 0.25 mg via INTRAVENOUS
  Filled 2023-06-19 (×4): qty 0.5

## 2023-06-19 MED ORDER — GLYCOPYRROLATE 0.2 MG/ML IJ SOLN: 0.2000 mg | INTRAMUSCULAR | Status: DC | PRN

## 2023-06-19 MED ORDER — ONDANSETRON HCL 4 MG/2ML IJ SOLN: 4.0000 mg | Freq: Four times a day (QID) | INTRAMUSCULAR | Status: DC | PRN

## 2023-06-19 MED ORDER — ONDANSETRON 4 MG PO TBDP: 4.0000 mg | ORAL_TABLET | Freq: Four times a day (QID) | ORAL | Status: DC | PRN

## 2023-06-19 MED ORDER — OXYCODONE HCL 5 MG PO TABS: 2.5000 mg | ORAL_TABLET | ORAL | Status: DC | PRN

## 2023-06-19 MED ORDER — BIOTENE DRY MOUTH MT LIQD: 15.0000 mL | OROMUCOSAL | Status: DC | PRN

## 2023-06-19 MED ORDER — LORAZEPAM 2 MG/ML IJ SOLN: 0.5000 mg | INTRAMUSCULAR | Status: DC | PRN

## 2023-06-19 MED ORDER — GLYCOPYRROLATE 1 MG PO TABS: 1.0000 mg | ORAL_TABLET | ORAL | Status: DC | PRN

## 2023-06-19 MED ORDER — HYDROMORPHONE HCL 1 MG/ML IJ SOLN
0.5000 mg | INTRAMUSCULAR | Status: DC | PRN
  Administered 2023-06-19: 0.5 mg via INTRAVENOUS
  Filled 2023-06-19: qty 0.5

## 2023-06-19 NOTE — Progress Notes (Signed)
Speech Language Pathology Treatment: Dysphagia  Patient Details Name: Lauren Santana MRN: [id number] DOB: 11-05-1951 Today's Date: 06/19/2023 Time: 0865-7846 SLP Time Calculation (min) (ACUTE ONLY): 11 min  Assessment / Plan / Recommendation Clinical Impression  Pt seen today with PO trials from her breakfast tray. Pt with no overt s/s of aspiration throughout all trials. More solid POs resulted in increased oral residue which was cleared with a liquid wash. She reports having some trouble with solids, such as meats, even when they are cut into small, bite-sized pieces. Recommend upgrading diet to Dys 2 with thin liquids and suspect this is similar to baseline. Pt requires total assistance for feeding. If pt chooses to prioritize full comfort, recommend liberalizing her diet to include regular textures with thin liquids. Will f/u as able to provide education regarding diet following discharge with hospice.    HPI HPI: 72 y.o. female presented from [facility] due to AMS. Dx acute encephalopathy, large decubitus ulcer, hypoglycemia, severe protein calorie malnutrition. PMHx hypertension, hyperlipidemia, obesity, atrial fibrillation, anemia, diffuse large cell B lymphoma, PE, chronic back pain      SLP Plan  Continue with current plan of care      Recommendations for follow up therapy are one component of a multi-disciplinary discharge planning process, led by the attending physician.  Recommendations may be updated based on patient status, additional functional criteria and insurance authorization.    Recommendations  Diet recommendations: Dysphagia 2 (fine chop);Thin liquid Liquids provided via: Cup;Straw Medication Administration: Whole meds with puree Supervision: Staff to assist with self feeding;Full supervision/cueing for compensatory strategies Compensations: Slow rate;Small sips/bites Postural Changes and/or Swallow Maneuvers: Seated upright 90 degrees;Upright 30-60 min  after meal                  Oral care BID   Frequent or constant Supervision/Assistance Dysphagia, unspecified (R13.10)     Continue with current plan of care     Gwynneth Aliment, M.A., CF-SLP Speech Language Pathology, Acute Rehabilitation Services  Secure Chat preferred [phone]   06/19/2023, 11:11 AM

## 2023-06-19 NOTE — Discharge Summary (Addendum)
Physician Discharge Summary  TECORA ODOM BJY:[id number] DOB: 04-03-1951 DOA: 06/16/2023  PCP: Renaye Rakers, MD  Admit date: 06/16/2023 Discharge date: 06/20/2023  Admitted From: Home Disposition:  Residential Hospice  Discharge Condition:Stable CODE STATUS:Comfort care Diet recommendation:  Dysphagia 2  Brief/Interim Summary: Patient is a 72 year old female with history of hypertension, hyperlipidemia, obesity, atrial fibrillation, anemia, diffuse large cell B lymphoma, PE, chronic back pain who presented with altered mental status from a facility.  Patient resides at nursing facility and was receiving IV antibiotics including vancomycin ,cefepime and Flagyl for large decubitus ulcer.  Patient's mentation gradually worsened.  At baseline, as per son,she is alert and oriented x 4.  She was mildly febrile presentation.  Labs showed potassium of 3.3, creatinine of 2 (baseline creatinine normal), glucose 60, albumin of 1.5, WBC count of 16.5, hemoglobin of 7.7.  FOBT negative.  Chest x-ray showed decreased lung volumes without any acute findings.  CT head didnt show any acute abnormality.  CT abdomen/pelvis did not show any acute abnormality but showed large decubitus ulcer on the left gluteus to the coccyx.  Patient was started on vancomycin and meropenem for decubitus ulcer.  Brain MRI did not show any acute findings.  MRI of the sacrum showed osteomyelitis.  ID consulted.  Patient's mentation slowly improved but she continued to have very poor oral intake.  She had very poor quality of life with multiple comorbidities.  After extensive goals of care after discussion with family, palliative care also involved.  Decision made to transition her care to comfort.  Plan is now to transfer out of residential hospice  Following problems were addressed during the hospitalization:  Acute encephalopathy: Unclear etiology.  Could be from sepsis or cefepime toxicity or polypharmacy.  She was  disoriented,  mumbling and speaking irreverently on admission. CT head did not show any acute findings.   As per report,patient is usually alert and oriented x 4 at baseline.  Presented with worsening encephalopathy.  UA was not suspicious for UTI. Takes gabapentin, tramadol, oxycodone, sertraline at  facility.  MRI of the brain did not show any acute findings. Ammonia level normal. Her mentation has slightly improved .  Large decubitus ulcer:  MRI of the sacrum showed osteomyelitis.Also showed Diffuse intramuscular edema of the included gluteal and pelvic musculature, suggestive of myositis. No drainable fluid collections.  Has moderate leukocytosis.Now on comfort care   Hypoglycemia: Likely from decreased oral intake.     AKI: Baseline creatinine normal.  Patient with creatinine in the range of 2,,on comfort care   Normocytic anemia: Hemoglobin of 12 about 3 months ago.  Now in the range of 8.  Negative FOBT, iron level optimal    Hyperlipidemia: On Lipitor at home.   Paroxysmal A-fib: Takes labetalol for rate control, on anticoagulation with Eliquis. Currently on normal sinus rhythm   History of PE:Was on  Eliquis   Chronic back pain: Takes tramadol ,oxy at baseline.   Severe protein calorie malnutrition: Albumin of 1.5.    History of diffuse large cell B lymphoma: Currently under observation.  Follows with Dr. Lazarus Salines, oncology at [facility]   Deconditioning/debility: Has been residing in nursing facility since 1 to 2 years.  Unable to walk.  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  Now with poor oral intake.  Very poor quality of life.  Currently on comfort care  Discharge Diagnoses:  Principal Problem:   Acute encephalopathy Active Problems:   Essential hypertension, benign   Dyslipidemia  Obesity (BMI 30-39.9)   AKI (acute kidney injury) (HCC)   History of pulmonary embolus (PE)   Atrial fibrillation (HCC)   Anemia of chronic disease   Chronic pain   DLBCL (diffuse  large B cell lymphoma) (HCC)    Discharge Instructions  Discharge Instructions     No wound care   Complete by: As directed       Allergies as of 06/20/2023       Reactions   Tylenol [acetaminophen] Other (See Comments)   States "seems like eats my stomach"   Sulfa Antibiotics Other (See Comments)   Unknown allergic reaction        Medication List     STOP taking these medications    acetaminophen 500 MG tablet Commonly known as: TYLENOL   atorvastatin 10 MG tablet Commonly known as: LIPITOR   diclofenac Sodium 1 % Gel Commonly known as: VOLTAREN   Eliquis 5 MG Tabs tablet Generic drug: apixaban   furosemide 20 MG tablet Commonly known as: LASIX   gabapentin 400 MG capsule Commonly known as: NEURONTIN   labetalol 100 MG tablet Commonly known as: NORMODYNE   METRONIDAZOLE IN NACL IV   NORMAL SALINE FLUSH IV   oxyCODONE 5 MG immediate release tablet Commonly known as: Oxy IR/ROXICODONE   pantoprazole 20 MG tablet Commonly known as: PROTONIX   potassium chloride 20 MEQ/15ML (10%) Soln   sertraline 50 MG tablet Commonly known as: ZOLOFT   sucralfate 1 GM/10ML suspension Commonly known as: CARAFATE   traMADol 50 MG tablet Commonly known as: ULTRAM   vitamin B-12 500 MCG tablet Commonly known as: CYANOCOBALAMIN         Allergies  Allergen Reactions   Tylenol [Acetaminophen] Other (See Comments)    States "seems like eats my stomach"   Sulfa Antibiotics Other (See Comments)    Unknown allergic reaction    Consultations: Palliative care, ID   Procedures/Studies: DG CHEST PORT 1 VIEW  Result Date: 06/19/2023 CLINICAL DATA:  Shortness of breath EXAM: PORTABLE CHEST 1 VIEW COMPARISON:  06/16/2023 FINDINGS: Study limited by severe rotation. No visible confluent airspace opacities or effusions. Heart and mediastinal contours difficult to assess due to rotation. No visible acute bony abnormality. IMPRESSION: Severely limited by rotation.   No visible confluent opacity. Electronically Signed   By: Charlett Nose M.D.   On: 06/19/2023 03:28   US RENAL  Result Date: 06/18/2023 CLINICAL DATA:  Acute kidney injury. EXAM: RENAL / URINARY TRACT ULTRASOUND COMPLETE COMPARISON:  Noncontrast CT 06/16/2023 FINDINGS: Right Kidney: Renal measurements: 9.8 x 5.8 x 3.8 cm = volume: 112.6 mL. No collecting system dilatation or perinephric fluid. Partially obscured by overlapping bowel gas and soft tissue. Left Kidney: Renal measurements: 11.3 x 5.6 x 4.2 cm = volume: 139.3 mL. No collecting system dilatation or perinephric fluid. This is a complex cystic area along the left kidney measuring 13 x 16 x 15 mm. This a septation, internal echoes and what appears to be a shadowing area, possible calcification as seen on CT. Bladder: Bladder is poorly seen. Other: Study limited by overlapping bowel gas and soft tissue. IMPRESSION: No collecting system dilatation. There is a complex cystic lesion involving the left kidney with some septations and calcifications. Please correlate for any prior workup or additional workup when appropriate such as postcontrast imaging. Electronically Signed   By: Karen Kays M.D.   On: 06/18/2023 19:25   MR SACRUM SI JOINTS W WO CONTRAST  Result Date: 06/18/2023 CLINICAL DATA:  Sepsis.  Sacral decubitus ulcer EXAM: MRI SACRUM WITHOUT AND WITH CONTRAST TECHNIQUE: Multiplanar and multiecho pulse sequences of the sacrum were obtained without and with intravenous contrast. CONTRAST:  10mL GADAVIST GADOBUTROL 1 MMOL/ML IV SOLN COMPARISON:  CT 06/16/2023, 05/22/2020 FINDINGS: Bones/Joint/Cartilage Large sacral decubitus ulcer overlying the distal sacrum and coccyx. There is bone marrow edema with intermediate T1 signal within the coccyx (series 3, image 13). Subcortical bone marrow edema is also seen within the distal most sacral segment (series 3, image 14). No additional sites of osteomyelitis are identified. No fracture or diastasis. No  sacroiliac joint effusion or synovitis to suggest septic arthritis. Degenerative disc disease of the included lower lumbar spine. Ligaments Intact. Muscles and Tendons Diffuse intramuscular edema of the included gluteal and pelvic musculature. Soft tissues Large sacral decubitus ulcer with packing material. Ulcer base extends to the cortex of the distal sacrum and coccyx. No drainable fluid collections. Known site of endometriosis within the left adnexal region measuring 2.8 cm in size (series 4, image 23), not appreciably changed since 2021. IMPRESSION: 1. Large sacral decubitus ulcer overlying the distal sacrum and coccyx. There is bone marrow edema within the coccyx and distal-most sacral segment, compatible with osteomyelitis. 2. Diffuse intramuscular edema of the included gluteal and pelvic musculature, suggestive of myositis. No drainable fluid collections. 3. Known site of endometriosis within the left adnexal region measuring 2.8 cm in size, not appreciably changed since 2021. Electronically Signed   By: Duanne Guess D.O.   On: 06/18/2023 08:24   MR BRAIN WO CONTRAST  Result Date: 06/17/2023 CLINICAL DATA:  Provided history: Encephalopathy. EXAM: MRI HEAD WITHOUT CONTRAST TECHNIQUE: Multiplanar, multiecho pulse sequences of the brain and surrounding structures were obtained without intravenous contrast. COMPARISON:  Head CT 06/16/2023. Brain MRI 12/20/2019. Cervical spine MRI 03/21/2023. FINDINGS: Brain: Mild generalized parenchymal atrophy. Multifocal T2 FLAIR hyperintense signal abnormality within the cerebral white matter, nonspecific but compatible with advanced chronic small vessel ischemic disease. Redemonstrated subcentimeter focus of susceptibility-weighted signal loss within the medial right cerebellar hemisphere, which may reflect a cavernoma or small chronic hemorrhage. There are a few punctate chronic microhemorrhages elsewhere within the right cerebellar hemisphere and supratentorial  brain. There is no acute infarct. No extra-axial fluid collection. No midline shift. Vascular: Maintained flow voids within the proximal large arterial vessels. Skull and upper cervical spine: No focal suspicious marrow lesion. Incompletely assessed cervical spondylosis. Known severe spinal canal stenosis at C3-C4. Sinuses/Orbits: No mass or acute finding within the imaged orbits. Other: The infiltrative process at the right skull base, right orbital apex and right maxillary sinus described on the prior brain MRI of 12/20/2019 is poorly reassessed in the absence of intravenous contrast. Trace fluid within the bilateral mastoid air cells. IMPRESSION: 1. No evidence of an acute intracranial abnormality. 2. The infiltrative process at the right skull base, right orbital apex and right maxillary sinus described on the prior brain MRI of 12/20/2019 is poorly reassessed in the absence of intravenous contrast. Correlate with the prior pathology findings. 3. Advanced chronic small vessel ischemic changes within the cerebral white matter, similar to the prior MRI. 4. Redemonstrated subcentimeter focus of signal abnormality within the medial right cerebellar hemisphere, which may reflect a cavernoma or small chronic hemorrhage. 5. Incompletely assessed cervical spondylosis with known severe spinal canal stenosis at C3-C4. Electronically Signed   By: Jackey Loge D.O.   On: 06/17/2023 12:27   CT Head Wo Contrast  Result Date: 06/16/2023 CLINICAL DATA:  Headache, new onset (Age >=  51y) EXAM: CT HEAD WITHOUT CONTRAST TECHNIQUE: Contiguous axial images were obtained from the base of the skull through the vertex without intravenous contrast. RADIATION DOSE REDUCTION: This exam was performed according to the departmental dose-optimization program which includes automated exposure control, adjustment of the mA and/or kV according to patient size and/or use of iterative reconstruction technique. COMPARISON:  CT scan head from  02/19/2023. FINDINGS: Examination is limited due to patient's motion during data acquisition. Only the gross findings are described as follows: Brain: No evidence of acute infarction, hemorrhage, hydrocephalus, extra-axial collection or mass lesion/mass effect. There is mild prominence of cerebral sulci due to age-related atrophy. There is bilateral periventricular hypodensity, which is non-specific but most likely seen in the settings of microvascular ischemic changes. Mild in extent. Vascular: No hyperdense vessel or unexpected calcification. Skull: Normal. Negative for fracture or focal lesion. Sinuses/Orbits: No acute finding. Other: None. IMPRESSION: *Limited exam. *No acute intracranial abnormality. Electronically Signed   By: Jules Schick M.D.   On: 06/16/2023 15:00   CT ABDOMEN PELVIS WO CONTRAST  Result Date: 06/16/2023 CLINICAL DATA:  Abdominal pain, acute, nonlocalized EXAM: CT ABDOMEN AND PELVIS WITHOUT CONTRAST TECHNIQUE: Multidetector CT imaging of the abdomen and pelvis was performed following the standard protocol without IV contrast. RADIATION DOSE REDUCTION: This exam was performed according to the departmental dose-optimization program which includes automated exposure control, adjustment of the mA and/or kV according to patient size and/or use of iterative reconstruction technique. COMPARISON:  CT scan abdomen and pelvis from 05/22/2020. FINDINGS: Examination is limited due to lack of intravenous contrast and patient's motion during data acquisition. Lower chest: There are patchy atelectatic changes in the visualized lung bases. No overt consolidation. No pleural effusion. The heart is normal in size. No pericardial effusion. Hepatobiliary: The liver is normal in size. Non-cirrhotic configuration. No suspicious mass. No intrahepatic or extrahepatic bile duct dilation. Small volume layering tiny calcified gallstones noted gallstones. Normal gallbladder wall thickness. No pericholecystic  inflammatory changes. Pancreas: Unremarkable. No pancreatic ductal dilatation or surrounding inflammatory changes. Spleen: Within normal limits. No focal lesion. Adrenals/Urinary Tract: Adrenal glands are unremarkable. No suspicious renal mass. There is a hypoattenuating structure in the left kidney interpolar region, anteriorly which exhibits tiny foci of dystrophic calcifications along the lateral aspect. This has decreased since the prior study from 2021 and may represent involuting slightly complex renal cyst. There is an additional hypoattenuating structure in the right kidney upper pole, laterally, which is incompletely characterized on the current examination but appears unchanged since the prior study. No hydronephrosis. No renal or ureteric calculi. Unremarkable urinary bladder. Stomach/Bowel: There is a small sliding hiatal hernia. There are at least 2 diverticula arising from the distal duodenum. No disproportionate dilation of the small or large bowel loops. No evidence of abnormal bowel wall thickening or inflammatory changes. The appendix is unremarkable. There are multiple diverticula mainly in the left hemi colon, without imaging signs of diverticulitis. Vascular/Lymphatic: No ascites or pneumoperitoneum. No abdominal or pelvic lymphadenopathy, by size criteria. No aneurysmal dilation of the major abdominal arteries. There are mild peripheral atherosclerotic vascular calcifications of the aorta and its major branches. Reproductive: The uterus is surgically absent. No large adnexal mass. Other: Multiple midline periumbilical incisional hernias noted containing fat. There is a hernia along the superior aspect which exhibits herniation of portion of the transverse colon without obstruction. There is a large decubitus ulcer over the left paramedian gluteal region with soft tissue defect of approximately 2.8 x 8.4 cm, reaching up to  the coccyx. Musculoskeletal: No suspicious osseous lesions. There are  moderate - marked multilevel degenerative changes in the visualized spine. IMPRESSION: 1. No acute inflammatory process identified within the abdomen or pelvis. 2. There is a large decubitus ulcer over the left paramedian gluteal region reaching up to the coccyx. 3. Multiple periumbilical midline incisional hernias with 1 hernia containing unobstructed portion of transverse colon. 4. Multiple other nonacute observations, as described above. Electronically Signed   By: Jules Schick M.D.   On: 06/16/2023 14:55   DG Chest Port 1 View  Result Date: 06/16/2023 CLINICAL DATA:  Questionable sepsis - evaluate for abnormality. EXAM: PORTABLE CHEST 1 VIEW COMPARISON:  02/19/2023. FINDINGS: Rotated patient. Low lung volumes accentuate the pulmonary vasculature and cardiomediastinal silhouette. No consolidation or pulmonary edema. No definite pleural effusion or pneumothorax, though patient's head obscures the right lung apex. IMPRESSION: Low lung volumes without evidence of acute cardiopulmonary disease. Electronically Signed   By: Orvan Falconer M.D.   On: 06/16/2023 12:46      Subjective: Patient seen and examined at bedside today.  Hemodynamically stable.  Her mentation is slightly improved today and she is oriented to place.  She knew her family members.  Continues to remain weak, deconditioned and has persistent poor oral intake with low blood sugar.  After extensive discussion of care done with family with the meeting along with palliative care involvement, care transitioned to comfort  Discharge Exam: Vitals:   06/19/23 2015 06/20/23 0920  BP: (!) 127/56 113/60  Pulse: 84 89  Resp: 16   Temp: 98.1 F (36.7 C) 98 F (36.7 C)  SpO2: 99% 99%   Vitals:   06/19/23 0513 06/19/23 0822 06/19/23 2015 06/20/23 0920  BP: 104/66 132/63 (!) 127/56 113/60  Pulse: 98 88 84 89  Resp:  20 16   Temp: 98.2 F (36.8 C) 98 F (36.7 C) 98.1 F (36.7 C) 98 F (36.7 C)  TempSrc: Oral Oral Oral Oral  SpO2:  100% 100% 99% 99%  Weight:      Height:        General: Pt is awake, confused, weak, deconditioned Cardiovascular: RRR, S1/S2 +, no rubs, no gallops Respiratory: CTA bilaterally, no wheezing, no rhonchi Abdominal: Soft, NT, ND, bowel sounds + Extremities: Anasarca    The results of significant diagnostics from this hospitalization (including imaging, microbiology, ancillary and laboratory) are listed below for reference.     Microbiology: Recent Results (from the past 240 hour(s))  Blood Culture (routine x 2)     Status: None (Preliminary result)   Collection Time: 06/16/23 12:00 PM   Specimen: BLOOD RIGHT FOREARM  Result Value Ref Range Status   Specimen Description BLOOD RIGHT FOREARM  Final   Special Requests   Final    BOTTLES DRAWN AEROBIC AND ANAEROBIC Blood Culture results may not be optimal due to an inadequate volume of blood received in culture bottles   Culture   Final    NO GROWTH 4 DAYS Performed at [facility], [address], [place], [place] [address]    Report Status PENDING  Incomplete  Blood Culture (routine x 2)     Status: None (Preliminary result)   Collection Time: 06/16/23 12:18 PM   Specimen: BLOOD  Result Value Ref Range Status   Specimen Description BLOOD RIGHT ANTECUBITAL  Final   Special Requests   Final    BOTTLES DRAWN AEROBIC ONLY Blood Culture adequate volume   Culture   Final    NO  GROWTH 4 DAYS Performed at [facility], [address] [place], [place] [address]    Report Status PENDING  Incomplete  MRSA Next Gen by PCR, Nasal     Status: None   Collection Time: 06/17/23  4:10 AM   Specimen: Nasal Mucosa; Nasal Swab  Result Value Ref Range Status   MRSA by PCR Next Gen NOT DETECTED NOT DETECTED Final    Comment: (NOTE) The GeneXpert MRSA Assay (FDA approved for NASAL specimens only), is one component of a comprehensive MRSA colonization surveillance program. It is not intended to diagnose MRSA infection nor to  guide or monitor treatment for MRSA infections. Test performance is not FDA approved in patients less than 35 years old. Performed at [facility], [address], [place], [place] [address]      Labs: BNP (last 3 results) Recent Labs    11/09/22 0114  BNP 39.3   Basic Metabolic Panel: Recent Labs  Lab 06/16/23 1218 06/17/23 0134 06/18/23 0248 06/19/23 0906  NA 134* 132* 133* 133*  K 3.3* 3.7 3.6 3.2*  CL 100 104 104 104  CO2 19* 19* 17* 21*  GLUCOSE 60* 52* 99 86  BUN 25* 26* 29* 30*  CREATININE 2.02* 1.89* 2.04* 2.07*  CALCIUM 8.2* 7.9* 7.9* 7.7*   Liver Function Tests: Recent Labs  Lab 06/16/23 1218 06/17/23 0134  AST 30 25  ALT 13 12  ALKPHOS 103 88  BILITOT 1.0 0.8  PROT 5.6* 5.0*  ALBUMIN 1.5* <1.5*   No results for input(s): "LIPASE", "AMYLASE" in the last 168 hours. Recent Labs  Lab 06/17/23 1539  AMMONIA 29   CBC: Recent Labs  Lab 06/16/23 1218 06/16/23 1713 06/17/23 0134 06/18/23 0248  WBC 16.5* 17.5* 17.2* 16.3*  NEUTROABS 12.0*  --   --   --   HGB 7.7* 8.9* 8.3* 7.2*  HCT 25.5* 30.3* 27.8* 23.3*  MCV 97.7 103.1* 98.2 95.1  PLT 351 243 297 249   Cardiac Enzymes: No results for input(s): "CKTOTAL", "CKMB", "CKMBINDEX", "TROPONINI" in the last 168 hours. BNP: Invalid input(s): "POCBNP" CBG: Recent Labs  Lab 06/18/23 1858 06/18/23 1944 06/18/23 2338 06/19/23 0329 06/19/23 0824  GLUCAP 159* 120* 112* 82 83   D-Dimer No results for input(s): "DDIMER" in the last 72 hours. Hgb A1c No results for input(s): "HGBA1C" in the last 72 hours. Lipid Profile No results for input(s): "CHOL", "HDL", "LDLCALC", "TRIG", "CHOLHDL", "LDLDIRECT" in the last 72 hours. Thyroid function studies No results for input(s): "TSH", "T4TOTAL", "T3FREE", "THYROIDAB" in the last 72 hours.  Invalid input(s): "FREET3" Anemia work up No results for input(s): "VITAMINB12", "FOLATE", "FERRITIN", "TIBC", "IRON", "RETICCTPCT" in the last 72  hours.  Urinalysis    Component Value Date/Time   COLORURINE AMBER (A) 06/16/2023 2108   APPEARANCEUR CLOUDY (A) 06/16/2023 2108   LABSPEC 1.017 06/16/2023 2108   PHURINE 5.0 06/16/2023 2108   GLUCOSEU NEGATIVE 06/16/2023 2108   HGBUR SMALL (A) 06/16/2023 2108   BILIRUBINUR NEGATIVE 06/16/2023 2108   KETONESUR NEGATIVE 06/16/2023 2108   PROTEINUR NEGATIVE 06/16/2023 2108   UROBILINOGEN 0.2 12/26/2016 1611   NITRITE NEGATIVE 06/16/2023 2108   LEUKOCYTESUR NEGATIVE 06/16/2023 2108   Sepsis Labs Recent Labs  Lab 06/16/23 1218 06/16/23 1713 06/17/23 0134 06/18/23 0248  WBC 16.5* 17.5* 17.2* 16.3*   Microbiology Recent Results (from the past 240 hour(s))  Blood Culture (routine x 2)     Status: None (Preliminary result)   Collection Time: 06/16/23 12:00 PM  Specimen: BLOOD RIGHT FOREARM  Result Value Ref Range Status   Specimen Description BLOOD RIGHT FOREARM  Final   Special Requests   Final    BOTTLES DRAWN AEROBIC AND ANAEROBIC Blood Culture results may not be optimal due to an inadequate volume of blood received in culture bottles   Culture   Final    NO GROWTH 4 DAYS Performed at [facility], [address], [place], [place] [address]    Report Status PENDING  Incomplete  Blood Culture (routine x 2)     Status: None (Preliminary result)   Collection Time: 06/16/23 12:18 PM   Specimen: BLOOD  Result Value Ref Range Status   Specimen Description BLOOD RIGHT ANTECUBITAL  Final   Special Requests   Final    BOTTLES DRAWN AEROBIC ONLY Blood Culture adequate volume   Culture   Final    NO GROWTH 4 DAYS Performed at [facility], [address] [place], [place] [address]    Report Status PENDING  Incomplete  MRSA Next Gen by PCR, Nasal     Status: None   Collection Time: 06/17/23  4:10 AM   Specimen: Nasal Mucosa; Nasal Swab  Result Value Ref Range Status   MRSA by PCR Next Gen NOT DETECTED NOT DETECTED Final    Comment: (NOTE) The GeneXpert MRSA  Assay (FDA approved for NASAL specimens only), is one component of a comprehensive MRSA colonization surveillance program. It is not intended to diagnose MRSA infection nor to guide or monitor treatment for MRSA infections. Test performance is not FDA approved in patients less than 21 years old. Performed at [facility], [address] [place], [place] [address]     Please note: You were cared for by a hospitalist during your hospital stay. Once you are discharged, your primary care physician will handle any further medical issues. Please note that NO REFILLS for any discharge medications will be authorized once you are discharged, as it is imperative that you return to your primary care physician (or establish a relationship with a primary care physician if you do not have one) for your post hospital discharge needs so that they can reassess your need for medications and monitor your lab values.    Time coordinating discharge: 40 minutes  SIGNED:   Burnadette Pop, MD  [facility] 06/20/2023, 10:42 AM Pager [phone]  If 7PM-7AM, please contact night-coverage [email or web address] Password [email or web address]

## 2023-06-19 NOTE — Progress Notes (Addendum)
[facility] Liaison Note  Referral received for patient/family interest in [facility]. Chart under review by [facility] physician.   Bed offered and accepted for transfer to [facility] tomorrow. Consents have been signed. Transport can be arranged tomorrow morning when appropriate.   Unit RN please call report to [phone] prior to patient leaving the unit. Please send signed DNR and paperwork with patient.   Please leave all IV access and ports in place.   Please call with any questions or concerns. Thank you  Dionicio Stall, [facility] Liaison [phone]

## 2023-06-19 NOTE — Consult Note (Signed)
Palliative Medicine Inpatient Consult Note  Consulting Provider:   Burnadette Pop, MD    Reason for consult:   Palliative Care Consult Services Palliative Medicine Consult  Reason for Consult? Poor quality of life, needs goals of care, sacral ulcer, nonambulatory, poor oral intake   06/19/2023  HPI:  Per intake H&P --> Lauren Santana is a 72 y.o. female with medical history significant of hypertension, hyperlipidemia, obesity, atrial fibrillation, anemia, diffuse large B-cell lymphoma, history of PE, chronic pain presenting with altered mental status from [facility].  Palliative care has been asked to get involved to discuss goals of care in the setting of progressive decline.  Clinical Assessment/Goals of Care:  *Please note that this is a verbal dictation therefore any spelling or grammatical errors are due to the "Dragon Medical One" system interpretation.  I have reviewed medical records including EPIC notes, labs and imaging, received report from bedside RN, assessed the patient who is lying in bed with endorsements of back pain.    I met with Dr. Renford Dills, patients two sons and DIL's to further discuss diagnosis prognosis, GOC, EOL wishes, disposition and options.   I introduced Palliative Medicine as specialized medical care for people living with serious illness. It focuses on providing relief from the symptoms and stress of a serious illness. The goal is to improve quality of life for both the patient and the family.  Medical History Review and Understanding:  A review of Lauren Santana's past medical history inclusive of hypertension, hyperlipidemia, atrial fibrillation, anemia, large B-cell lymphoma, pulmonary embolisms, mobility impairment, and sacral ulcer.  Social History:  Lauren Santana is from [place] originally.  She is divorced.  She has 2 sons and had 6 grandchildren that 1 is deceased.  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  She is identified as a very [place] woman.  Functional and Nutritional State:  Preceding hospitalization [name] had lived at [facility] skilled nursing facility since November 2023.  She has been completely dependent on staff at [facility] for all B ADLs as well as feeding.  She has been bedbound.  Advance Directives:  A detailed discussion was had today regarding advanced directives.  Lauren Santana's son, Lauren Santana is her surrogate Management consultant.  Code Status:  Concepts specific to code status, artifical feeding and hydration, continued IV antibiotics and rehospitalization was had.  The difference between a aggressive medical intervention path  and a palliative comfort care path for this patient at this time was had.   Encouraged patient/family to consider DNR/DNI status understanding evidenced based poor outcomes in similar hospitalized patient, as the cause of arrest is likely associated with advanced chronic/terminal illness rather than an easily reversible acute cardio-pulmonary event. I explained that DNR/DNI does not change the medical plan and it only comes into effect after a person has arrested (died).  It is a protective measure to keep [place] from harming the patient in their last moments of life.  Patient's family were agreeable to DNR/DNI with understanding that patient would not receive CPR, defibrillation, ACLS medications, or intubation.   Discussion:  Dr. Renford Dills was able to offer insights on [name]'s present health state.  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  We reviewed the therapy she is receiving for that as well as the antibiotic she is on.  We reviewed that unfortunately in the setting of her exceptionally poor mobility state as well as her nutritional state her  outcomes are likely to be poor.  We discussed various options of care 1 being a more aggressive path of care 1 being a less aggressive more comfort based path of care.   Patient's sons both agree that they would not want heroic life-sustaining measures as she was clear when her son died she would not want those things.  We discussed artificial means of nutrition such as a gastrostomy tube though we did further identify this does not improve the quality of life she was living.    We discussed the pursuit of comfort measures. We talked about transition to comfort measures in house and what that would entail inclusive of medications to control pain, dyspnea, agitation, nausea, itching, and hiccups.  We discussed stopping all uneccessary measures such as cardiac monitoring, blood draws, needle sticks, and frequent vital signs.   Patient's family all agree that if her time is short they would want her to be peaceful and dignified and her passing from this earth to the next.  Patient's family would ideally like her to go to inpatient hospice they had made the preference of [facility].  Discussed the importance of continued conversation with family and their  medical providers regarding overall plan of care and treatment options, ensuring decisions are within the context of the patients values and GOCs.  Decision Maker: Lauren, Santana (Son): [phone] (Mobile)   SUMMARY OF RECOMMENDATIONS   DNAR/DNI   Comfort measures  Unrestricted visitation  Will initiate low-dose Dilaudid around-the-clock for chronic pain  Additional comfort medications per [facility]  Appreciate TOC reaching out to [facility] patient has been accepted and will be transition tomorrow  Ongoing palliative care support  Code Status/Advance Care Planning: DNAR/DNI  Palliative Prophylaxis:  Aspiration, Bowel Regimen, Delirium Protocol, Frequent Pain Assessment, Oral Care, Palliative Wound Care, and Turn Reposition  Additional Recommendations (Limitations, Scope, Preferences): Continue current care  Psycho-social/Spiritual:  Desire for further Chaplaincy support:  Yes Additional  Recommendations: Education on chronic disease trajectory   Prognosis: Limited overall - days to weeks at the most  Discharge Planning: Discharge to [facility].  Vitals:   06/19/23 0144 06/19/23 0513  BP: (!) 95/49 104/66  Pulse: 88 98  Resp:    Temp: 98 F (36.7 C) 98.2 F (36.8 C)  SpO2: 99% 100%    Intake/Output Summary (Last 24 hours) at 06/19/2023 4166 Last data filed at 06/19/2023 0515 Gross per 24 hour  Intake 1812.55 ml  Output 950 ml  Net 862.55 ml   Last Weight  Most recent update: 06/18/2023  4:14 AM    Weight  89.2 kg (196 lb 10.4 oz)            Gen:  Elderly AA F in moderate distress HEENT: moist mucous membranes CV: Regular rate and rhythm  PULM: On RA, breathing is even and nonlabored ABD: soft/nontender  EXT: Diffuse anasarca Neuro: Alert and oriented x1  PPS: 10%   This conversation/these recommendations were discussed with patient primary care team, Dr. Renford Dills  Billing based on MDM: High  Problems Addressed: One acute or chronic illness or injury that poses a threat to life or bodily function  Amount and/or Complexity of Data: Category 3:Discussion of management or test interpretation with external physician/other qualified health care professional/appropriate source (not separately reported)  Risks: Parenteral controlled substances and Decision not to resuscitate or to de-escalate care because of poor prognosis ______________________________________________________ Lamarr Lulas [place] Palliative Medicine Team Team Cell Phone: [phone] Please utilize secure chat with additional questions,  if there is no response within 30 minutes please call the above phone number  Palliative Medicine Team providers are available by phone from 7am to 7pm daily and can be reached through the team cell phone.  Should this patient require assistance outside of these hours, please call the patient's attending physician.

## 2023-06-19 NOTE — TOC Progression Note (Signed)
Transition of Care [facility]) - Progression Note    Patient Details  Name: Lauren Santana MRN: [id number] Date of Birth: 1951/10/28  [facility]) CM/SW Contact  Dellie Burns [place], [place] Phone Number: 06/19/2023, 10:36 AM  Clinical Narrative: per Palliative Medicine APP pt's son plans to proceed with comfort measures and is agreeable to hospice home placement, requesting [facility]. Referral made to [facility] with [facility].   Pt has been approved for [facility]. Plan to admit tomorrow.   Dellie Burns, MSW, LCSW [phone] (coverage)        Expected Discharge Plan: Skilled Nursing Facility    Expected Discharge Plan and Services                                               Social Determinants of Health (SDOH) Interventions SDOH Screenings   Food Insecurity: No Food Insecurity (05/01/2023)   Received from [facility], [facility]  Transportation Needs: No Transportation Needs (05/01/2023)   Received from [facility], [facility]  Utilities: Not At Risk (05/01/2023)   Received from [facility], [facility]  Financial Resource Strain: Low Risk  (05/01/2023)   Received from [facility], [facility]  Social Connections: Unknown (03/30/2023)   Received from [facility], [facility]  Tobacco Use: Low Risk  (06/16/2023)    Readmission Risk Interventions     No data to display

## 2023-06-19 NOTE — Plan of Care (Signed)

## 2023-06-20 DIAGNOSIS — Z515 Encounter for palliative care: Secondary | ICD-10-CM | POA: Diagnosis not present

## 2023-06-20 NOTE — TOC Transition Note (Signed)
Transition of Care [facility]) - CM/SW Discharge Note   Patient Details  Name: Lauren Santana MRN: [id number] Date of Birth: June 16, 1951  Transition of Care [facility]) CM/SW Contact:  Deatra [place], [place] Phone Number: 06/20/2023, 10:27 AM   Clinical Narrative:  pt for dc to [facility] today. Per Watt Climes with [facility], pt's son has completed admission consent paperwork and they are prepared to admit pt today. RN provided with number for report and PT[facility] arranged for transport. SW signing off at dc.   Dellie Burns, MSW, LCSW [phone] (coverage)      Final next level of care: Hospice Medical Facility Barriers to Discharge: No Barriers Identified   Patient Goals and CMS Choice      Discharge Placement                  Patient to be transferred to facility by: [facility] Name of family member notified: George/son Patient and family notified of of transfer: 06/20/23  Discharge Plan and Services Additional resources added to the After Visit Summary for                                       Social Determinants of Health (SDOH) Interventions SDOH Screenings   Food Insecurity: No Food Insecurity (05/01/2023)   Received from [facility], [facility]  Transportation Needs: No Transportation Needs (05/01/2023)   Received from [facility], [facility]  Utilities: Not At Risk (05/01/2023)   Received from [facility], [facility]  Financial Resource Strain: Low Risk  (05/01/2023)   Received from [facility], [facility]  Social Connections: Unknown (03/30/2023)   Received from [facility], [facility]  Tobacco Use: Low Risk  (06/16/2023)     Readmission Risk Interventions     No data to display

## 2023-06-20 NOTE — Transportation (Addendum)
Transportation just picked [facility] Report attempted, RN will call back.  1129: Report given.

## 2023-06-20 NOTE — Progress Notes (Addendum)
   Palliative Medicine Inpatient Follow Up Note HPI:  Lauren Santana is a 72 y.o. female with medical history significant of hypertension, hyperlipidemia, obesity, atrial fibrillation, anemia, diffuse large B-cell lymphoma, history of PE, chronic pain presenting with altered mental status from [facility].   Palliative care has been asked to get involved to discuss goals of care in the setting of progressive decline.  Today's Discussion 06/20/2023  *Please note that this is a verbal dictation therefore any spelling or grammatical errors are due to the "Dragon Medical One" system interpretation.  Chart reviewed inclusive of vital signs, progress notes, laboratory results, and diagnostic images. Received x1 extra dose of dilaudid overnight for comfort.   I met with Lauren Santana at bedside. She was awake and alert aware of self though reoriented that we were in the hospital. She shares that she feels "crunched up". I spoke to her about moving her so that she would be more comfortable.   Created space and opportunity for patient to explore thoughts feelings and fears regarding current medical situation. She shares that she is feeling "alright".   No family at bedside this morning on assessment. Plan for transition to IP hospice this morning.   Questions and concerns addressed/Palliative Support Provided.   Objective Assessment: Vital Signs Vitals:   06/19/23 2015 06/20/23 0920  BP: (!) 127/56 113/60  Pulse: 84 89  Resp: 16   Temp: 98.1 F (36.7 C) 98 F (36.7 C)  SpO2: 99% 99%    Intake/Output Summary (Last 24 hours) at 06/20/2023 9604 Last data filed at 06/20/2023 5409 Gross per 24 hour  Intake 334.74 ml  Output 1100 ml  Net -765.26 ml   Last Weight  Most recent update: 06/18/2023  4:14 AM    Weight  89.2 kg (196 lb 10.4 oz)            Gen:  Elderly AA F in moderate distress HEENT: moist mucous membranes CV: Regular rate and rhythm  PULM: On RA, breathing is even and  nonlabored ABD: soft/nontender  EXT: Diffuse anasarca Neuro: Alert and oriented x1  SUMMARY OF RECOMMENDATIONS   DNAR/DNI   Comfort measures   Unrestricted visitation   Continue low-dose Dilaudid around-the-clock for chronic pain   Additional comfort medications per [facility]   Plan for [facility] today  Billing based on MDM: High - Risks: Parenteral controlled substances ______________________________________________________________________________________ Lamarr Lulas [place] Palliative Medicine Team Team Cell Phone: [phone] Please utilize secure chat with additional questions, if there is no response within 30 minutes please call the above phone number  Palliative Medicine Team providers are available by phone from 7am to 7pm daily and can be reached through the team cell phone.  Should this patient require assistance outside of these hours, please call the patient's attending physician.

## 2023-06-20 NOTE — Progress Notes (Signed)
0.25mg  of IV Hydromorphone (dilaudid) wasted with Marylene Land (Consulting civil engineer on 3w).

## 2023-06-20 NOTE — Plan of Care (Signed)
  Problem: Education: Goal: Knowledge of General Education information will improve Description: Including pain rating scale, medication(s)/side effects and non-pharmacologic comfort measures Outcome: Not Progressing   Problem: Health Behavior/Discharge Planning: Goal: Ability to manage health-related needs will improve Outcome: Not Progressing   Problem: Clinical Measurements: Goal: Ability to maintain clinical measurements within normal limits will improve Outcome: Not Progressing Goal: Will remain free from infection Outcome: Not Progressing Goal: Diagnostic test results will improve Outcome: Not Progressing Goal: Respiratory complications will improve Outcome: Not Progressing Goal: Cardiovascular complication will be avoided Outcome: Not Progressing   Problem: Activity: Goal: Risk for activity intolerance will decrease Outcome: Not Progressing   Problem: Nutrition: Goal: Adequate nutrition will be maintained Outcome: Not Progressing   Problem: Coping: Goal: Level of anxiety will decrease Outcome: Not Progressing   Problem: Elimination: Goal: Will not experience complications related to bowel motility Outcome: Not Progressing Goal: Will not experience complications related to urinary retention Outcome: Not Progressing   Problem: Pain Managment: Goal: General experience of comfort will improve Outcome: Not Progressing   Problem: Safety: Goal: Ability to remain free from injury will improve Outcome: Not Progressing   Problem: Skin Integrity: Goal: Risk for impaired skin integrity will decrease Outcome: Not Progressing   Problem: Education: Goal: Knowledge of the prescribed therapeutic regimen will improve Outcome: Not Progressing   Problem: Coping: Goal: Ability to identify and develop effective coping behavior will improve Outcome: Not Progressing   Problem: Clinical Measurements: Goal: Quality of life will improve Outcome: Not Progressing   Problem:  Respiratory: Goal: Verbalizations of increased ease of respirations will increase Outcome: Not Progressing   Problem: Role Relationship: Goal: Family's ability to cope with current situation will improve Outcome: Not Progressing Goal: Ability to verbalize concerns, feelings, and thoughts to partner or family member will improve Outcome: Not Progressing   Problem: Pain Management: Goal: Satisfaction with pain management regimen will improve Outcome: Not Progressing

## 2023-06-20 NOTE — Progress Notes (Signed)
Patient seen and examined at the bedside today.  She looked very comfortable this morning.  Alert and awake, communicates well.  The plan is for transfer to [facility] today.  There is no change in the medical management.  Discharge summary and orders have been placed

## 2023-06-22 DIAGNOSIS — M1990 Unspecified osteoarthritis, unspecified site: Secondary | ICD-10-CM | POA: Diagnosis not present

## 2023-06-22 DIAGNOSIS — E7800 Pure hypercholesterolemia, unspecified: Secondary | ICD-10-CM | POA: Diagnosis not present

## 2023-07-26 DEATH — deceased
